# Patient Record
Sex: Male | Born: 1945
Health system: Southern US, Community
[De-identification: ages and names within clinical notes are randomized; demographics above are authoritative.]

## PROBLEM LIST (undated history)

## (undated) DIAGNOSIS — I219 Acute myocardial infarction, unspecified: Secondary | ICD-10-CM

## (undated) DIAGNOSIS — M1990 Unspecified osteoarthritis, unspecified site: Secondary | ICD-10-CM

## (undated) DIAGNOSIS — D689 Coagulation defect, unspecified: Secondary | ICD-10-CM

## (undated) DIAGNOSIS — G4730 Sleep apnea, unspecified: Secondary | ICD-10-CM

## (undated) DIAGNOSIS — E119 Type 2 diabetes mellitus without complications: Secondary | ICD-10-CM

## (undated) DIAGNOSIS — K219 Gastro-esophageal reflux disease without esophagitis: Secondary | ICD-10-CM

## (undated) DIAGNOSIS — G709 Myoneural disorder, unspecified: Secondary | ICD-10-CM

## (undated) DIAGNOSIS — C801 Malignant (primary) neoplasm, unspecified: Secondary | ICD-10-CM

## (undated) DIAGNOSIS — E785 Hyperlipidemia, unspecified: Secondary | ICD-10-CM

## (undated) DIAGNOSIS — R51 Headache: Secondary | ICD-10-CM

## (undated) DIAGNOSIS — I2510 Atherosclerotic heart disease of native coronary artery without angina pectoris: Secondary | ICD-10-CM

## (undated) DIAGNOSIS — I10 Essential (primary) hypertension: Secondary | ICD-10-CM

## (undated) HISTORY — DX: Coagulation defect, unspecified: D68.9

## (undated) HISTORY — PX: COLONOSCOPY: SHX174

## (undated) HISTORY — PX: CARPAL TUNNEL RELEASE: SHX101

## (undated) HISTORY — PX: TONSILLECTOMY: SUR1361

## (undated) HISTORY — DX: Sleep apnea, unspecified: G47.30

## (undated) HISTORY — DX: Hyperlipidemia, unspecified: E78.5

## (undated) HISTORY — PX: EXCISION MORTON'S NEUROMA: SHX5013

## (undated) HISTORY — PX: HERNIA REPAIR: SHX51

## (undated) HISTORY — PX: CARDIAC CATHETERIZATION: SHX172

## (undated) HISTORY — DX: Myoneural disorder, unspecified: G70.9

## (undated) HISTORY — PX: EYE SURGERY: SHX253

## (undated) HISTORY — DX: Malignant (primary) neoplasm, unspecified: C80.1

## (undated) HISTORY — DX: Essential (primary) hypertension: I10

## (undated) HISTORY — PX: POLYPECTOMY: SHX149

## (undated) HISTORY — PX: UMBILICAL HERNIA REPAIR: SHX196

---

## 2001-09-20 ENCOUNTER — Ambulatory Visit (HOSPITAL_COMMUNITY): Admission: RE | Admit: 2001-09-20 | Discharge: 2001-09-20 | Payer: Self-pay | Admitting: Gastroenterology

## 2001-09-20 ENCOUNTER — Encounter (INDEPENDENT_AMBULATORY_CARE_PROVIDER_SITE_OTHER): Payer: Self-pay | Admitting: Specialist

## 2002-05-25 ENCOUNTER — Ambulatory Visit (HOSPITAL_COMMUNITY): Admission: RE | Admit: 2002-05-25 | Discharge: 2002-05-25 | Payer: Self-pay | Admitting: Chiropractic Medicine

## 2002-05-25 ENCOUNTER — Encounter: Payer: Self-pay | Admitting: Chiropractic Medicine

## 2003-09-11 ENCOUNTER — Ambulatory Visit (HOSPITAL_COMMUNITY): Admission: RE | Admit: 2003-09-11 | Discharge: 2003-09-11 | Payer: Self-pay | Admitting: General Surgery

## 2003-09-11 ENCOUNTER — Ambulatory Visit (HOSPITAL_BASED_OUTPATIENT_CLINIC_OR_DEPARTMENT_OTHER): Admission: RE | Admit: 2003-09-11 | Discharge: 2003-09-11 | Payer: Self-pay | Admitting: General Surgery

## 2003-09-11 ENCOUNTER — Encounter (INDEPENDENT_AMBULATORY_CARE_PROVIDER_SITE_OTHER): Payer: Self-pay | Admitting: *Deleted

## 2006-10-11 ENCOUNTER — Ambulatory Visit (HOSPITAL_COMMUNITY): Admission: RE | Admit: 2006-10-11 | Discharge: 2006-10-11 | Payer: Self-pay | Admitting: Internal Medicine

## 2008-06-05 ENCOUNTER — Encounter: Admission: RE | Admit: 2008-06-05 | Discharge: 2008-06-05 | Payer: Self-pay | Admitting: Internal Medicine

## 2008-06-10 ENCOUNTER — Encounter: Admission: RE | Admit: 2008-06-10 | Discharge: 2008-06-10 | Payer: Self-pay | Admitting: Internal Medicine

## 2008-06-10 HISTORY — PX: CARDIOVASCULAR STRESS TEST: SHX262

## 2009-01-12 DIAGNOSIS — G5600 Carpal tunnel syndrome, unspecified upper limb: Secondary | ICD-10-CM | POA: Insufficient documentation

## 2009-01-12 DIAGNOSIS — G4730 Sleep apnea, unspecified: Secondary | ICD-10-CM | POA: Insufficient documentation

## 2009-01-12 DIAGNOSIS — F419 Anxiety disorder, unspecified: Secondary | ICD-10-CM | POA: Insufficient documentation

## 2009-01-12 DIAGNOSIS — N529 Male erectile dysfunction, unspecified: Secondary | ICD-10-CM | POA: Insufficient documentation

## 2009-01-12 DIAGNOSIS — M519 Unspecified thoracic, thoracolumbar and lumbosacral intervertebral disc disorder: Secondary | ICD-10-CM | POA: Insufficient documentation

## 2009-01-12 DIAGNOSIS — J45909 Unspecified asthma, uncomplicated: Secondary | ICD-10-CM | POA: Insufficient documentation

## 2009-01-26 DIAGNOSIS — M1990 Unspecified osteoarthritis, unspecified site: Secondary | ICD-10-CM | POA: Insufficient documentation

## 2009-10-15 ENCOUNTER — Ambulatory Visit: Payer: Self-pay | Admitting: Cardiovascular Disease

## 2010-04-27 ENCOUNTER — Other Ambulatory Visit: Payer: Self-pay

## 2010-04-28 ENCOUNTER — Ambulatory Visit
Admission: RE | Admit: 2010-04-28 | Discharge: 2010-04-28 | Disposition: A | Discharge status: Home / Self Care | Payer: 59 | Source: Ambulatory Visit | Attending: Internal Medicine | Admitting: Internal Medicine

## 2010-04-28 ENCOUNTER — Other Ambulatory Visit: Payer: Self-pay | Admitting: Internal Medicine

## 2010-04-28 DIAGNOSIS — R140 Abdominal distension (gaseous): Secondary | ICD-10-CM

## 2010-04-29 ENCOUNTER — Other Ambulatory Visit: Payer: Self-pay | Admitting: Gastroenterology

## 2010-04-29 DIAGNOSIS — R1013 Epigastric pain: Secondary | ICD-10-CM

## 2010-04-30 ENCOUNTER — Ambulatory Visit
Admission: RE | Admit: 2010-04-30 | Discharge: 2010-04-30 | Disposition: A | Discharge status: Home / Self Care | Payer: 59 | Source: Ambulatory Visit | Attending: Gastroenterology | Admitting: Gastroenterology

## 2010-04-30 DIAGNOSIS — R1013 Epigastric pain: Secondary | ICD-10-CM

## 2010-07-02 NOTE — Op Note (Signed)
NAME:  Hunter Mcbride, Hunter Mcbride                             ACCOUNT NO.:  [id number]   MEDICAL RECORD NO.:  [id number]                   PATIENT TYPE:  AMB   LOCATION:  DSC                                  FACILITY:  [facility]   PHYSICIAN:  Sammuel Hines. Daiva Nakayama, M.D.              DATE OF BIRTH:  1945-05-20   DATE OF PROCEDURE:  09/11/2003  DATE OF DISCHARGE:                                 OPERATIVE REPORT   PREOPERATIVE DIAGNOSIS:  A 1.0 cm mass in the left chest wall.   POSTOPERATIVE DIAGNOSIS:  A 1.0 cm mass in the left chest wall.   PROCEDURE:  Excision of 1.0 cm mass in the left chest wall.   SURGEON:  Sammuel Hines. Marlou Starks, M.D.   ANESTHESIA:  Local.   DESCRIPTION OF PROCEDURE:  After informed consent was obtained, the patient  was brought to the operating room and placed in the lateral position with  the left side up on the operating room table.  The area in question was  palpable.  It was prepped with Betadine and draped in the usual sterile  manner.  The skin overlying the area was infiltrated with 1% lidocaine with  1% lidocaine until a good fill block was obtained.   Next, a small transverse incision was made overlying the mass, and in an  elliptical fashion this incision was carried down through the skin and  subcutaneous tissue sharply with the #15 blade knife.  The palpable mass was  then able to be excised sharply from the rest of the subcutaneous tissue  with the Metzenbaum  scissors. The mass was then removed and sent to  pathology for further evaluation.  The incision was then closed with  interrupted 4-0 Monocryl subcuticular stitches.  Dressed with a Dermabond  dressing.   The patient tolerated the procedure well.  At the end of the case all  sponge, needle and instrument counts were correct.  The patient was then  awakened and taken to the recovery room in stable condition.                                               Sammuel Hines. Daiva Nakayama, M.D.    PST/MEDQ  D:  09/12/2003  T:   09/12/2003  Job:  [id number]

## 2010-10-07 ENCOUNTER — Telehealth: Payer: Self-pay | Admitting: Cardiovascular Disease

## 2010-10-07 ENCOUNTER — Encounter: Payer: Self-pay | Admitting: Cardiovascular Disease

## 2010-10-07 NOTE — Telephone Encounter (Signed)
Exertional cp 3 weeks ago, saw pcp and he said to see cardio. No current cp and is at work currently. Made app to see dr Lauro Regulus on first day he is in office. Pt to go to er with any further cp or call office with questions or concerns. Pt verbalized understanding. Corwin Levins RN

## 2010-10-07 NOTE — Telephone Encounter (Signed)
Patient called with recall letter to schedule an appointment.  When I went to sched 10.10. (first avail.) patient stated he has been having chest pain of an on about 2 wks ago.  He did see primary care and was told call cardiologist.  Patient going out of town mid-Sept.  Please call to triage this patient and get appt set.

## 2010-10-13 ENCOUNTER — Encounter: Payer: Self-pay | Admitting: Cardiovascular Disease

## 2010-10-13 ENCOUNTER — Ambulatory Visit (INDEPENDENT_AMBULATORY_CARE_PROVIDER_SITE_OTHER): Payer: 59 | Admitting: Cardiovascular Disease

## 2010-10-13 DIAGNOSIS — R079 Chest pain, unspecified: Secondary | ICD-10-CM

## 2010-10-13 DIAGNOSIS — R0789 Other chest pain: Secondary | ICD-10-CM | POA: Insufficient documentation

## 2010-10-13 NOTE — Progress Notes (Signed)
Hunter Mcbride Date of Birth  [birth year] [facility] [id number] [address]     [place],   [address] [phone]  Fax  [phone]  History of Present Illness:  Hunter Mcbride is a 65 year old gentleman with a history of hypertension and hyperlipidemia. He's been having some episodes of exertional chest discomfort.  There is no diaphoresis. There is no dyspnea. It is described as a chest pressure or heaviness. He does comment that he's been little bit more fatigue than usual.  Current Outpatient Prescriptions on File Prior to Visit  Medication Sig Dispense Refill  . aspirin 81 MG tablet Take 81 mg by mouth daily.        . Celecoxib (CELEBREX PO) Take by mouth as needed.        . chlorthalidone (HYGROTON) 25 MG tablet Take 25 mg by mouth daily.        [name] diltiazem (CARDIZEM CD) 180 MG 24 hr capsule Take 180 mg by mouth daily.        [name] gabapentin (NEURONTIN) 300 MG capsule Take 300 mg by mouth at bedtime.        . irbesartan (AVAPRO) 300 MG tablet Take 150 mg by mouth at bedtime.        . metFORMIN (GLUCOPHAGE) 500 MG tablet Take 500 mg by mouth 2 (two) times daily with a meal.        . nitroGLYCERIN (NITROSTAT) 0.4 MG SL tablet Place 0.4 mg under the tongue every 5 (five) minutes as needed.        . RABEprazole (ACIPHEX) 20 MG tablet Take 20 mg by mouth daily.        . rosuvastatin (CRESTOR) 10 MG tablet Take 5 mg by mouth daily.        . vitamin E (VITAMIN E) 400 UNIT capsule Take 400 Units by mouth daily.          No Known Allergies  Past Medical History  Diagnosis Date  . Chest pain   . Hyperlipidemia   . Hypertension   . Diabetes mellitus   . Sleep apnea   . Asthma   . Gout     Past Surgical History  Procedure Date  . Cardiovascular stress test 06/10/2008    EF 68%    History  Smoking status  . Never Smoker   Smokeless tobacco  . Not on file    History  Alcohol Use No    Family History  Problem Relation Age of Onset  .  Diabetes type II Mother   . Heart attack Father   . Coronary artery disease Cousin     Reviw of Systems:  Reviewed in the HPI.  All other systems are negative.  Physical Exam: BP 140/82  Pulse 69  Ht 5\' 9"  (1.753 m)  Wt 204 lb 6.4 oz (92.715 kg)  BMI 30.18 kg/m2 The patient is alert and oriented x 3.  The mood and affect are normal.   Skin: warm and dry.  Color is normal.    HEENT:   the sclera are nonicteric.  The mucous membranes are moist.  The carotids are 2+ without bruits.  There is no thyromegaly.  There is no JVD.    Lungs: clear.  The chest wall is non tender.    Heart: regular rate with a normal S1 and S2.  There are no murmurs, gallops, or rubs. The PMI is not displaced.     Abdomen: good bowel sounds.  There is no guarding or rebound.  There is no hepatosplenomegaly or tenderness.  There are no masses.   Extremities:  no clubbing, cyanosis, or edema.  The legs are without rashes.  The distal pulses are intact.   Neuro:  Cranial nerves II - XII are intact.  Motor and sensory functions are intact.    The gait is normal.  ECG:  Assessment / Plan:

## 2010-10-13 NOTE — Assessment & Plan Note (Signed)
He presents with some episodes of chest burning and chest pressure with exertion. These are concerning for unstable angina. We'll schedule him for a stress Myoview study.    His blood pressure is a little high today but it has been normal on previous checks.

## 2010-10-14 ENCOUNTER — Encounter: Payer: Self-pay | Admitting: Cardiovascular Disease

## 2010-10-19 ENCOUNTER — Encounter: Payer: Self-pay | Admitting: *Deleted

## 2010-10-20 ENCOUNTER — Telehealth: Payer: Self-pay | Admitting: Cardiovascular Disease

## 2010-10-20 NOTE — Telephone Encounter (Signed)
Faxed over EKG and Stress Test w/pics.  No Echo available.

## 2010-10-20 NOTE — Telephone Encounter (Signed)
Patsy needs last ECHO report, Last Stress Report with pics faxed

## 2010-10-21 ENCOUNTER — Ambulatory Visit (HOSPITAL_COMMUNITY): Payer: 59 | Attending: Cardiovascular Disease | Admitting: Radiology

## 2010-10-21 DIAGNOSIS — R0789 Other chest pain: Secondary | ICD-10-CM

## 2010-10-21 DIAGNOSIS — R079 Chest pain, unspecified: Secondary | ICD-10-CM

## 2010-10-21 DIAGNOSIS — E119 Type 2 diabetes mellitus without complications: Secondary | ICD-10-CM

## 2010-10-21 MED ORDER — TECHNETIUM TC 99M TETROFOSMIN IV KIT
11.0000 | PACK | Freq: Once | INTRAVENOUS | Status: AC | PRN
  Administered 2010-10-21: 11 via INTRAVENOUS

## 2010-10-21 MED ORDER — TECHNETIUM TC 99M TETROFOSMIN IV KIT
33.0000 | PACK | Freq: Once | INTRAVENOUS | Status: AC | PRN
  Administered 2010-10-21: 33 via INTRAVENOUS

## 2010-10-21 NOTE — Progress Notes (Signed)
[place] [address] [phone]  Cardiology Nuclear Med [place]  Hunter Mcbride is a 65 y.o. male [id number] Jan 29, 1946   Nuclear Med Background Indication for Stress Test:  Evaluation for Ischemia History: 06/10/08 Myocardial Perfusion Study: EF 68% Cardiac Risk Factors: Family History - CAD, Hypertension, Lipids and NIDDM  Symptoms:  Chest Pain, Chest Pressure, DOE, Fatigue and Palpitations   Nuclear Pre-Procedure Caffeine/Decaff Intake:  None NPO After: 7:00pm   Lungs:  clear IV 0.9% NS with Angio Cath:  20g  IV Site: R Forearm  IV Started by:  Eliezer Lofts, EMT-P  Chest Size (in):  46 Cup Size: n/a  Height: 5\' 9"  (1.753 m)  Weight:  204 lb (92.534 kg)  BMI:  Body mass index is 30.13 kg/(m^2). Tech Comments:  NA    Nuclear Med Study 1 or 2 day study: 1 day  Stress Test Type:  Stress  Reading MD: Jenkins Rouge, MD  Order Authorizing Provider:  P.Nahser  Resting Radionuclide: Technetium 75m Tetrofosmin  Resting Radionuclide Dose: 11.0 mCi   Stress Radionuclide:  Technetium 68m Tetrofosmin  Stress Radionuclide Dose: 33.0 mCi           Stress Protocol Rest HR: 54 Stress HR: 136  Rest BP: 139/75 Stress BP: 207/78  Exercise Time (min): 10:15 METS: 12.10   Predicted Max HR: 157 bpm % Max HR: 86.62 bpm Rate Pressure Product: 28152   Dose of Adenosine (mg):  n/a Dose of Lexiscan: n/a mg  Dose of Atropine (mg): n/a Dose of Dobutamine: n/a mcg/kg/min (at max HR)  Stress Test Technologist: Perrin Maltese, EMT-P  Nuclear Technologist:  Charlton Amor, CNMT     Rest Procedure:  Myocardial perfusion imaging was performed at rest 45 minutes following the intravenous administration of Technetium 77m Tetrofosmin. Rest ECG: NSR  Stress Procedure:  The patient exercised for 10:15.  The patient stopped due to fatigue and denied any chest pain.  There were non specific ST-T wave changes and rare pvcs/pacs.  Technetium  29m Tetrofosmin was injected at peak exercise and myocardial perfusion imaging was performed after a brief delay. Stress ECG: 1 mm upsloping ST segment depression in lateral leads at peak exercise  QPS Raw Data Images:  Normal; no motion artifact; normal heart/lung ratio. Stress Images:  Normal homogeneous uptake in all areas of the myocardium. Rest Images:  Normal homogeneous uptake in all areas of the myocardium. Subtraction (SDS):  Normal Transient Ischemic Dilatation (Normal <1.22):  1.18 Lung/Heart Ratio (Normal <0.45):  0.36  Quantitative Gated Spect Images QGS EDV:  109 ml QGS ESV:  48 ml QGS cine images:  NL LV Function; NL Wall Motion QGS EF: 56%  Impression Exercise Capacity:  Excellent exercise capacity. BP Response:  Hypertensive blood pressure response. Clinical Symptoms:  No chest pain. ECG Impression:  1 mm upsloping ST segment depression in lateral leads Comparison with Prior Nuclear Study: No images to compare  Overall Impression:  Low risk study.  Hypertensive response to exercise and borderline ECG response.  Normal EF and normal nuclear images    [employer]

## 2010-10-25 ENCOUNTER — Telehealth: Payer: Self-pay | Admitting: Cardiology

## 2010-10-25 NOTE — Telephone Encounter (Signed)
Left message to call back regarding stress test results.

## 2010-10-25 NOTE — Telephone Encounter (Signed)
Message copied by Alm Bustard on Mon Oct 25, 2010  5:03 PM ------      Message from: Dale, [place]      Created: Fri Oct 22, 2010  5:12 PM       Normal stress test.

## 2010-10-26 ENCOUNTER — Telehealth: Payer: Self-pay | Admitting: *Deleted

## 2010-10-26 NOTE — Telephone Encounter (Signed)
Patient called with normal echo results. Corwin Levins RN

## 2010-10-26 NOTE — Telephone Encounter (Signed)
Test results

## 2011-04-11 ENCOUNTER — Ambulatory Visit (INDEPENDENT_AMBULATORY_CARE_PROVIDER_SITE_OTHER): Payer: 59 | Admitting: Cardiovascular Disease

## 2011-04-11 ENCOUNTER — Encounter: Payer: Self-pay | Admitting: Cardiovascular Disease

## 2011-04-11 DIAGNOSIS — E119 Type 2 diabetes mellitus without complications: Secondary | ICD-10-CM | POA: Insufficient documentation

## 2011-04-11 DIAGNOSIS — E785 Hyperlipidemia, unspecified: Secondary | ICD-10-CM

## 2011-04-11 DIAGNOSIS — R079 Chest pain, unspecified: Secondary | ICD-10-CM

## 2011-04-11 DIAGNOSIS — I10 Essential (primary) hypertension: Secondary | ICD-10-CM | POA: Insufficient documentation

## 2011-04-11 LAB — HEPATIC FUNCTION PANEL
ALT: 26 U/L (ref 0–53)
Bilirubin, Direct: 0.2 mg/dL (ref 0.0–0.3)
Total Protein: 6.7 g/dL (ref 6.0–8.3)

## 2011-04-11 LAB — BASIC METABOLIC PANEL
BUN: 15 mg/dL (ref 6–23)
GFR: 101.83 mL/min (ref >60.00)
Potassium: 3.7 mEq/L (ref 3.5–5.1)
Sodium: 137 mEq/L (ref 135–145)

## 2011-04-11 LAB — LIPID PANEL
Cholesterol: 123 mg/dL (ref 0–200)
VLDL: 23.8 mg/dL (ref 0.0–40.0)

## 2011-04-11 NOTE — Assessment & Plan Note (Signed)
Stable

## 2011-04-11 NOTE — Assessment & Plan Note (Signed)
Hunter Mcbride is doing fairly well. We'll check fasting lipid profile later this week. I'll see him again in 6 months for an office visit antacid meds. He is to continue with a good diet and exercise program.

## 2011-04-11 NOTE — Patient Instructions (Signed)
Your physician wants you to follow-up in: 6 months  You will receive a reminder letter in the mail two months in advance. If you don't receive a letter, please call our office to schedule the follow-up appointment.   Your physician recommends that you return for a FASTING lipid profile: 6 months

## 2011-04-11 NOTE — Progress Notes (Signed)
Hunter Mcbride Date of Birth  02-Dec-1945 [name] [address]    [place], [place]  [address]    [place], [place]  [address] [phone]  Fax  [phone]  [phone]  Fax [phone]  Problem list: 1. History of chest pain-negative stress Myoview study in April, 2010 2. Hyperlipidemia 3. Hypertension 4. Diabetes mellitus 5. Sleep apnea  History of Present Illness:  Hunter Mcbride is doing well.  His quite active. 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 He's not had any episodes of chest pain, shortness breath, syncope, or presyncope.  Current Outpatient Prescriptions on File Prior to Visit  Medication Sig Dispense Refill  . Acetaminophen (TYLENOL PO) Take by mouth as needed.        [name] aspirin 81 MG tablet Take 81 mg by mouth daily.        . Celecoxib (CELEBREX PO) Take by mouth as needed.        . chlorthalidone (HYGROTON) 25 MG tablet Take 25 mg by mouth daily.        [name] diltiazem (CARDIZEM CD) 180 MG 24 hr capsule Take 180 mg by mouth daily.        [name] gabapentin (NEURONTIN) 300 MG capsule Take 300 mg by mouth at bedtime.        . irbesartan (AVAPRO) 300 MG tablet Take 150 mg by mouth at bedtime.        . metFORMIN (GLUCOPHAGE) 500 MG tablet Take 500 mg by mouth 2 (two) times daily with a meal.        . nitroGLYCERIN (NITROSTAT) 0.4 MG SL tablet Place 0.4 mg under the tongue every 5 (five) minutes as needed.        . rosuvastatin (CRESTOR) 10 MG tablet Take 5 mg by mouth daily.          No Known Allergies  Past Medical History  Diagnosis Date  . Chest pain   . Hyperlipidemia   . Hypertension   . Diabetes mellitus   . Sleep apnea   . Asthma   . Gout     Past Surgical History  Procedure Date  . Cardiovascular stress test 06/10/2008    EF 68%    History  Smoking status  . Never Smoker   Smokeless tobacco  . Not on file    History  Alcohol Use No    Family History  Problem  Relation Age of Onset  . Diabetes type II Mother   . Heart attack Father   . Coronary artery disease Cousin     Reviw of Systems:  Reviewed in the HPI.  All other systems are negative.  Physical Exam: Blood pressure 145/85, pulse 65, height 5\' 9"  (1.753 m), weight 208 lb 1.9 oz (94.403 kg). General: Well developed, well nourished, in no acute distress.  Head: Normocephalic, atraumatic, sclera non-icteric, mucus membranes are moist,   Neck: Supple. Carotids are 2 + without bruits. No JVD  Lungs: Clear bilaterally to auscultation.  Heart: regular rate  With normal  S1 S2. No murmurs, gallops or rubs.  Abdomen: Soft, non-tender, non-distended with normal bowel sounds. No hepatomegaly. No rebound/guarding. No masses.  Msk:  Strength and tone are normal  Extremities: No clubbing or cyanosis. No edema.  Distal pedal pulses are 2+ and equal bilaterally.  Neuro: Alert and oriented X 3. Moves all extremities spontaneously.  Psych:  Responds to questions appropriately with a normal affect.  ECG:  Assessment / Plan:

## 2011-04-15 DIAGNOSIS — M25519 Pain in unspecified shoulder: Secondary | ICD-10-CM | POA: Insufficient documentation

## 2011-04-15 DIAGNOSIS — R6882 Decreased libido: Secondary | ICD-10-CM | POA: Insufficient documentation

## 2011-10-19 DIAGNOSIS — M7021 Olecranon bursitis, right elbow: Secondary | ICD-10-CM | POA: Insufficient documentation

## 2011-10-27 ENCOUNTER — Encounter: Payer: Self-pay | Admitting: Cardiovascular Disease

## 2011-11-25 ENCOUNTER — Other Ambulatory Visit: Payer: Self-pay | Admitting: Orthopedic Surgery

## 2011-11-25 DIAGNOSIS — M25511 Pain in right shoulder: Secondary | ICD-10-CM

## 2011-11-30 ENCOUNTER — Ambulatory Visit
Admission: RE | Admit: 2011-11-30 | Discharge: 2011-11-30 | Disposition: A | Discharge status: Home / Self Care | Payer: Medicare Other | Source: Ambulatory Visit | Attending: Orthopedic Surgery | Admitting: Orthopedic Surgery

## 2011-11-30 DIAGNOSIS — M25511 Pain in right shoulder: Secondary | ICD-10-CM

## 2011-12-01 DIAGNOSIS — H9190 Unspecified hearing loss, unspecified ear: Secondary | ICD-10-CM | POA: Insufficient documentation

## 2011-12-02 ENCOUNTER — Other Ambulatory Visit: Payer: Self-pay | Admitting: Orthopedic Surgery

## 2011-12-02 ENCOUNTER — Encounter (HOSPITAL_COMMUNITY): Payer: Self-pay

## 2011-12-02 ENCOUNTER — Ambulatory Visit (HOSPITAL_COMMUNITY)
Admission: RE | Admit: 2011-12-02 | Discharge: 2011-12-02 | Disposition: A | Discharge status: Home / Self Care | Payer: Medicare Other | Source: Ambulatory Visit | Attending: Orthopedic Surgery | Admitting: Orthopedic Surgery

## 2011-12-02 DIAGNOSIS — IMO0002 Reserved for concepts with insufficient information to code with codable children: Secondary | ICD-10-CM | POA: Insufficient documentation

## 2011-12-02 MED ORDER — SODIUM CHLORIDE 0.9 % IV SOLN: INTRAVENOUS | Status: DC

## 2011-12-02 MED ORDER — CEFAZOLIN SODIUM-DEXTROSE 2-3 GM-% IV SOLR
2.0000 g | INTRAVENOUS | Status: AC
  Administered 2011-12-02: 2 g via INTRAVENOUS
  Filled 2011-12-02: qty 50

## 2012-02-27 ENCOUNTER — Encounter (HOSPITAL_COMMUNITY)
Admission: RE | Admit: 2012-02-27 | Discharge: 2012-02-27 | Disposition: A | Discharge status: Home / Self Care | Payer: Medicare Other | Source: Ambulatory Visit | Attending: Orthopedic Surgery | Admitting: Orthopedic Surgery

## 2012-02-27 ENCOUNTER — Encounter (HOSPITAL_COMMUNITY): Payer: Self-pay

## 2012-02-27 DIAGNOSIS — Z01818 Encounter for other preprocedural examination: Secondary | ICD-10-CM | POA: Diagnosis not present

## 2012-02-27 HISTORY — DX: Unspecified osteoarthritis, unspecified site: M19.90

## 2012-02-27 HISTORY — DX: Gastro-esophageal reflux disease without esophagitis: K21.9

## 2012-02-27 LAB — TYPE AND SCREEN: ABO/RH(D): O POS

## 2012-02-27 LAB — CBC
Hemoglobin: 15.4 g/dL (ref 13.0–17.0)
MCV: 86.3 fL (ref 78.0–100.0)
Platelets: 152 10*3/uL (ref 150–400)
RBC: 5.19 MIL/uL (ref 4.22–5.81)
WBC: 5 10*3/uL (ref 4.0–10.5)

## 2012-02-27 LAB — BASIC METABOLIC PANEL
CO2: 27 mEq/L (ref 19–32)
Chloride: 100 mEq/L (ref 96–112)
Glucose, Bld: 259 mg/dL — ABNORMAL HIGH (ref 70–99)
Sodium: 141 mEq/L (ref 135–145)

## 2012-02-27 LAB — SURGICAL PCR SCREEN
MRSA, PCR: NEGATIVE
Staphylococcus aureus: POSITIVE — AB

## 2012-02-27 NOTE — Progress Notes (Signed)
Abnormal labs noted: Gluc=259.  Chart given to A. Zelnak, PA, to review.

## 2012-02-28 DIAGNOSIS — H01009 Unspecified blepharitis unspecified eye, unspecified eyelid: Secondary | ICD-10-CM | POA: Diagnosis not present

## 2012-02-28 DIAGNOSIS — H2510 Age-related nuclear cataract, unspecified eye: Secondary | ICD-10-CM | POA: Diagnosis not present

## 2012-02-28 DIAGNOSIS — H40019 Open angle with borderline findings, low risk, unspecified eye: Secondary | ICD-10-CM | POA: Diagnosis not present

## 2012-02-28 NOTE — Consult Note (Signed)
Anesthesiology Chart Review:  67 Y/O male with obesity, Htn, Type 2 DM for R. Total Shoulder arthroplasty 03/01/12 by Dr. Onnie Graham.  (-) nuclear stress test with EF 56% on 10/21/10 saw Dr. Acie Fredrickson 04/11/11 and was clinically stable   Glucose 259 labs O/W OK.  Pt's anesthesiologist assigned to his case will evaluate him on the DOS.  Roberts Gaudy, MD

## 2012-02-29 MED ORDER — CHLORHEXIDINE GLUCONATE 4 % EX LIQD: 60.0000 mL | Freq: Once | CUTANEOUS | Status: DC

## 2012-02-29 MED ORDER — CEFAZOLIN SODIUM-DEXTROSE 2-3 GM-% IV SOLR
2.0000 g | INTRAVENOUS | Status: AC
  Administered 2012-03-01: 2 g via INTRAVENOUS
  Filled 2012-02-29: qty 50

## 2012-03-01 ENCOUNTER — Inpatient Hospital Stay (HOSPITAL_COMMUNITY): Payer: Medicare Other | Admitting: Anesthesiology

## 2012-03-01 ENCOUNTER — Encounter (HOSPITAL_COMMUNITY): Payer: Self-pay | Admitting: Anesthesiology

## 2012-03-01 ENCOUNTER — Inpatient Hospital Stay (HOSPITAL_COMMUNITY)
Admission: RE | Admit: 2012-03-01 | Discharge: 2012-03-02 | DRG: 484 | Disposition: A | Discharge status: Home / Self Care | Payer: Medicare Other | Source: Ambulatory Visit | Attending: Orthopedic Surgery | Admitting: Orthopedic Surgery

## 2012-03-01 ENCOUNTER — Encounter (HOSPITAL_COMMUNITY)
Admission: RE | Disposition: A | Discharge status: Home / Self Care | Payer: Self-pay | Source: Ambulatory Visit | Attending: Orthopedic Surgery

## 2012-03-01 DIAGNOSIS — E119 Type 2 diabetes mellitus without complications: Secondary | ICD-10-CM | POA: Diagnosis present

## 2012-03-01 DIAGNOSIS — Z7982 Long term (current) use of aspirin: Secondary | ICD-10-CM

## 2012-03-01 DIAGNOSIS — I10 Essential (primary) hypertension: Secondary | ICD-10-CM | POA: Diagnosis present

## 2012-03-01 DIAGNOSIS — E785 Hyperlipidemia, unspecified: Secondary | ICD-10-CM | POA: Diagnosis present

## 2012-03-01 DIAGNOSIS — Z01818 Encounter for other preprocedural examination: Secondary | ICD-10-CM | POA: Diagnosis not present

## 2012-03-01 DIAGNOSIS — J45909 Unspecified asthma, uncomplicated: Secondary | ICD-10-CM | POA: Diagnosis present

## 2012-03-01 DIAGNOSIS — Z79899 Other long term (current) drug therapy: Secondary | ICD-10-CM

## 2012-03-01 DIAGNOSIS — M109 Gout, unspecified: Secondary | ICD-10-CM | POA: Diagnosis present

## 2012-03-01 DIAGNOSIS — Z01812 Encounter for preprocedural laboratory examination: Secondary | ICD-10-CM

## 2012-03-01 DIAGNOSIS — G8918 Other acute postprocedural pain: Secondary | ICD-10-CM | POA: Diagnosis not present

## 2012-03-01 DIAGNOSIS — G4730 Sleep apnea, unspecified: Secondary | ICD-10-CM | POA: Diagnosis present

## 2012-03-01 DIAGNOSIS — M1990 Unspecified osteoarthritis, unspecified site: Secondary | ICD-10-CM | POA: Diagnosis present

## 2012-03-01 DIAGNOSIS — K219 Gastro-esophageal reflux disease without esophagitis: Secondary | ICD-10-CM | POA: Diagnosis present

## 2012-03-01 DIAGNOSIS — M19019 Primary osteoarthritis, unspecified shoulder: Secondary | ICD-10-CM | POA: Diagnosis not present

## 2012-03-01 DIAGNOSIS — M25519 Pain in unspecified shoulder: Secondary | ICD-10-CM | POA: Diagnosis not present

## 2012-03-01 HISTORY — PX: TOTAL SHOULDER ARTHROPLASTY: SHX126

## 2012-03-01 LAB — GLUCOSE, CAPILLARY: Glucose-Capillary: 147 mg/dL — ABNORMAL HIGH (ref 70–99)

## 2012-03-01 SURGERY — ARTHROPLASTY, SHOULDER, TOTAL
Anesthesia: General | Site: Shoulder | Laterality: Right | Wound class: Clean

## 2012-03-01 MED ORDER — ASPIRIN 81 MG PO TABS: 81.0000 mg | ORAL_TABLET | Freq: Every day | ORAL | Status: DC

## 2012-03-01 MED ORDER — ONDANSETRON HCL 4 MG PO TABS: 4.0000 mg | ORAL_TABLET | Freq: Four times a day (QID) | ORAL | Status: DC | PRN

## 2012-03-01 MED ORDER — PHENOL 1.4 % MT LIQD: 1.0000 | OROMUCOSAL | Status: DC | PRN

## 2012-03-01 MED ORDER — MIDAZOLAM HCL 5 MG/5ML IJ SOLN
INTRAMUSCULAR | Status: DC | PRN
  Administered 2012-03-01: 2 mg via INTRAVENOUS

## 2012-03-01 MED ORDER — IRBESARTAN 150 MG PO TABS
150.0000 mg | ORAL_TABLET | Freq: Every day | ORAL | Status: DC
  Administered 2012-03-01 – 2012-03-02 (×2): 150 mg via ORAL
  Filled 2012-03-01 (×2): qty 1

## 2012-03-01 MED ORDER — PROPOFOL 10 MG/ML IV BOLUS
INTRAVENOUS | Status: DC | PRN
  Administered 2012-03-01: 200 mg via INTRAVENOUS

## 2012-03-01 MED ORDER — CHLORTHALIDONE 25 MG PO TABS
25.0000 mg | ORAL_TABLET | Freq: Every day | ORAL | Status: DC
  Administered 2012-03-01 – 2012-03-02 (×2): 25 mg via ORAL
  Filled 2012-03-01 (×2): qty 1

## 2012-03-01 MED ORDER — ONDANSETRON HCL 4 MG/2ML IJ SOLN
4.0000 mg | Freq: Four times a day (QID) | INTRAMUSCULAR | Status: DC | PRN
  Administered 2012-03-01: 4 mg via INTRAVENOUS
  Filled 2012-03-01: qty 2

## 2012-03-01 MED ORDER — METOCLOPRAMIDE HCL 5 MG/ML IJ SOLN: 5.0000 mg | Freq: Three times a day (TID) | INTRAMUSCULAR | Status: DC | PRN

## 2012-03-01 MED ORDER — ALUM & MAG HYDROXIDE-SIMETH 200-200-20 MG/5ML PO SUSP: 30.0000 mL | ORAL | Status: DC | PRN

## 2012-03-01 MED ORDER — DOCUSATE SODIUM 100 MG PO CAPS
100.0000 mg | ORAL_CAPSULE | Freq: Two times a day (BID) | ORAL | Status: DC
  Administered 2012-03-01 – 2012-03-02 (×2): 100 mg via ORAL
  Filled 2012-03-01 (×2): qty 1

## 2012-03-01 MED ORDER — HYDROMORPHONE HCL PF 1 MG/ML IJ SOLN
0.2500 mg | INTRAMUSCULAR | Status: DC | PRN
  Administered 2012-03-01 (×2): 0.5 mg via INTRAVENOUS

## 2012-03-01 MED ORDER — ACETAMINOPHEN 10 MG/ML IV SOLN
INTRAVENOUS | Status: DC | PRN
  Administered 2012-03-01: 1000 mg via INTRAVENOUS

## 2012-03-01 MED ORDER — METOCLOPRAMIDE HCL 10 MG PO TABS: 5.0000 mg | ORAL_TABLET | Freq: Three times a day (TID) | ORAL | Status: DC | PRN

## 2012-03-01 MED ORDER — NEOSTIGMINE METHYLSULFATE 1 MG/ML IJ SOLN
INTRAMUSCULAR | Status: DC | PRN
  Administered 2012-03-01: 5 mg via INTRAVENOUS

## 2012-03-01 MED ORDER — ONDANSETRON HCL 4 MG/2ML IJ SOLN
INTRAMUSCULAR | Status: DC | PRN
  Administered 2012-03-01: 4 mg via INTRAVENOUS

## 2012-03-01 MED ORDER — PANTOPRAZOLE SODIUM 40 MG PO TBEC: 40.0000 mg | DELAYED_RELEASE_TABLET | Freq: Every day | ORAL | Status: DC

## 2012-03-01 MED ORDER — METHOCARBAMOL 500 MG PO TABS
500.0000 mg | ORAL_TABLET | Freq: Three times a day (TID) | ORAL | Status: DC | PRN
  Administered 2012-03-01: 500 mg via ORAL
  Filled 2012-03-01: qty 1

## 2012-03-01 MED ORDER — HYDROMORPHONE HCL PF 1 MG/ML IJ SOLN
0.2500 mg | INTRAMUSCULAR | Status: DC | PRN
  Administered 2012-03-01: 1 mg via INTRAVENOUS
  Administered 2012-03-02: 0.5 mg via INTRAVENOUS
  Filled 2012-03-01 (×2): qty 1

## 2012-03-01 MED ORDER — LACTATED RINGERS IV SOLN
INTRAVENOUS | Status: DC | PRN
  Administered 2012-03-01 (×2): via INTRAVENOUS

## 2012-03-01 MED ORDER — OXYCODONE HCL 5 MG PO TABS: 5.0000 mg | ORAL_TABLET | Freq: Once | ORAL | Status: DC | PRN

## 2012-03-01 MED ORDER — EPHEDRINE SULFATE 50 MG/ML IJ SOLN
INTRAMUSCULAR | Status: DC | PRN
  Administered 2012-03-01 (×5): 10 mg via INTRAVENOUS

## 2012-03-01 MED ORDER — BUPIVACAINE HCL (PF) 0.5 % IJ SOLN
INTRAMUSCULAR | Status: DC | PRN
  Administered 2012-03-01: 30 mL

## 2012-03-01 MED ORDER — ASPIRIN EC 81 MG PO TBEC
81.0000 mg | DELAYED_RELEASE_TABLET | Freq: Every day | ORAL | Status: DC
  Administered 2012-03-02: 81 mg via ORAL
  Filled 2012-03-01: qty 1

## 2012-03-01 MED ORDER — ATORVASTATIN CALCIUM 20 MG PO TABS
20.0000 mg | ORAL_TABLET | Freq: Every day | ORAL | Status: DC
  Administered 2012-03-01: 20 mg via ORAL
  Filled 2012-03-01 (×2): qty 1

## 2012-03-01 MED ORDER — METHOCARBAMOL 100 MG/ML IJ SOLN
500.0000 mg | Freq: Three times a day (TID) | INTRAVENOUS | Status: DC | PRN
  Filled 2012-03-01: qty 5

## 2012-03-01 MED ORDER — LACTATED RINGERS IV SOLN: INTRAVENOUS | Status: DC

## 2012-03-01 MED ORDER — ACETAMINOPHEN 650 MG RE SUPP: 650.0000 mg | Freq: Four times a day (QID) | RECTAL | Status: DC | PRN

## 2012-03-01 MED ORDER — SODIUM CHLORIDE 0.9 % IR SOLN
Status: DC | PRN
  Administered 2012-03-01: 1000 mL

## 2012-03-01 MED ORDER — GLYCOPYRROLATE 0.2 MG/ML IJ SOLN
INTRAMUSCULAR | Status: DC | PRN
  Administered 2012-03-01: .8 mg via INTRAVENOUS

## 2012-03-01 MED ORDER — OXYCODONE HCL 5 MG/5ML PO SOLN: 5.0000 mg | Freq: Once | ORAL | Status: DC | PRN

## 2012-03-01 MED ORDER — ACETAMINOPHEN 10 MG/ML IV SOLN
INTRAVENOUS | Status: AC
  Filled 2012-03-01: qty 100

## 2012-03-01 MED ORDER — NITROGLYCERIN 0.4 MG SL SUBL: 0.4000 mg | SUBLINGUAL_TABLET | SUBLINGUAL | Status: DC | PRN

## 2012-03-01 MED ORDER — GABAPENTIN 300 MG PO CAPS
300.0000 mg | ORAL_CAPSULE | Freq: Every day | ORAL | Status: DC
  Administered 2012-03-01: 300 mg via ORAL
  Filled 2012-03-01 (×2): qty 1

## 2012-03-01 MED ORDER — MENTHOL 3 MG MT LOZG: 1.0000 | LOZENGE | OROMUCOSAL | Status: DC | PRN

## 2012-03-01 MED ORDER — ROCURONIUM BROMIDE 100 MG/10ML IV SOLN
INTRAVENOUS | Status: DC | PRN
  Administered 2012-03-01: 10 mg via INTRAVENOUS
  Administered 2012-03-01: 50 mg via INTRAVENOUS
  Administered 2012-03-01: 10 mg via INTRAVENOUS

## 2012-03-01 MED ORDER — OXYCODONE HCL 5 MG PO TABS: 5.0000 mg | ORAL_TABLET | ORAL | Status: DC | PRN

## 2012-03-01 MED ORDER — PROMETHAZINE HCL 25 MG/ML IJ SOLN
6.2500 mg | INTRAMUSCULAR | Status: DC | PRN
  Administered 2012-03-01: 6.25 mg via INTRAVENOUS

## 2012-03-01 MED ORDER — DIPHENHYDRAMINE HCL 12.5 MG/5ML PO ELIX: 12.5000 mg | ORAL_SOLUTION | ORAL | Status: DC | PRN

## 2012-03-01 MED ORDER — DILTIAZEM HCL ER COATED BEADS 180 MG PO CP24
180.0000 mg | ORAL_CAPSULE | Freq: Every day | ORAL | Status: DC
  Administered 2012-03-01: 180 mg via ORAL
  Filled 2012-03-01 (×2): qty 1

## 2012-03-01 MED ORDER — ARTIFICIAL TEARS OP OINT
TOPICAL_OINTMENT | OPHTHALMIC | Status: DC | PRN
  Administered 2012-03-01: 1 via OPHTHALMIC

## 2012-03-01 MED ORDER — FENTANYL CITRATE 0.05 MG/ML IJ SOLN
INTRAMUSCULAR | Status: DC | PRN
  Administered 2012-03-01 (×3): 50 ug via INTRAVENOUS
  Administered 2012-03-01: 100 ug via INTRAVENOUS
  Administered 2012-03-01: 50 ug via INTRAVENOUS
  Administered 2012-03-01 (×2): 100 ug via INTRAVENOUS

## 2012-03-01 MED ORDER — LIDOCAINE HCL (CARDIAC) 20 MG/ML IV SOLN
INTRAVENOUS | Status: DC | PRN
  Administered 2012-03-01: 100 mg via INTRAVENOUS

## 2012-03-01 MED ORDER — KETOROLAC TROMETHAMINE 15 MG/ML IJ SOLN
15.0000 mg | Freq: Four times a day (QID) | INTRAMUSCULAR | Status: DC
  Administered 2012-03-01 – 2012-03-02 (×4): 15 mg via INTRAVENOUS
  Filled 2012-03-01 (×7): qty 1

## 2012-03-01 MED ORDER — PHENYLEPHRINE HCL 10 MG/ML IJ SOLN
INTRAMUSCULAR | Status: DC | PRN
  Administered 2012-03-01: 80 ug via INTRAVENOUS
  Administered 2012-03-01: 40 ug via INTRAVENOUS
  Administered 2012-03-01 (×2): 80 ug via INTRAVENOUS

## 2012-03-01 MED ORDER — HYDROMORPHONE HCL PF 1 MG/ML IJ SOLN
INTRAMUSCULAR | Status: AC
  Filled 2012-03-01: qty 1

## 2012-03-01 MED ORDER — TEMAZEPAM 15 MG PO CAPS: 15.0000 mg | ORAL_CAPSULE | Freq: Every evening | ORAL | Status: DC | PRN

## 2012-03-01 MED ORDER — ACETAMINOPHEN 325 MG PO TABS: 650.0000 mg | ORAL_TABLET | Freq: Four times a day (QID) | ORAL | Status: DC | PRN

## 2012-03-01 MED ORDER — PROMETHAZINE HCL 25 MG/ML IJ SOLN
INTRAMUSCULAR | Status: AC
  Filled 2012-03-01: qty 1

## 2012-03-01 MED ORDER — MEPERIDINE HCL 25 MG/ML IJ SOLN: 6.2500 mg | INTRAMUSCULAR | Status: DC | PRN

## 2012-03-01 MED ORDER — ACETAMINOPHEN 10 MG/ML IV SOLN
1000.0000 mg | Freq: Four times a day (QID) | INTRAVENOUS | Status: DC
  Administered 2012-03-01 – 2012-03-02 (×3): 1000 mg via INTRAVENOUS
  Filled 2012-03-01 (×5): qty 100

## 2012-03-01 MED ORDER — CEFAZOLIN SODIUM 1-5 GM-% IV SOLN
1.0000 g | Freq: Four times a day (QID) | INTRAVENOUS | Status: AC
  Administered 2012-03-01 – 2012-03-02 (×3): 1 g via INTRAVENOUS
  Filled 2012-03-01 (×3): qty 50

## 2012-03-01 MED ORDER — LACTATED RINGERS IV SOLN
INTRAVENOUS | Status: DC
  Administered 2012-03-01 (×2): via INTRAVENOUS

## 2012-03-01 SURGICAL SUPPLY — 62 items
BLADE SAW SGTL 83.5X18.5 (BLADE) ×2 IMPLANT
CEMENT BONE DEPUY (Cement) ×1 IMPLANT
CLOTH BEACON ORANGE TIMEOUT ST (SAFETY) ×2 IMPLANT
COVER SURGICAL LIGHT HANDLE (MISCELLANEOUS) ×2 IMPLANT
DRAPE INCISE IOBAN 66X45 STRL (DRAPES) ×2 IMPLANT
DRAPE SURG 17X11 SM STRL (DRAPES) ×2 IMPLANT
DRAPE SURG 17X23 STRL (DRAPES) ×2 IMPLANT
DRAPE U-SHAPE 47X51 STRL (DRAPES) ×2 IMPLANT
DRILL BIT 7/64X5 (BIT) IMPLANT
DRSG AQUACEL AG ADV 3.5X10 (GAUZE/BANDAGES/DRESSINGS) ×2 IMPLANT
DRSG MEPILEX BORDER 4X8 (GAUZE/BANDAGES/DRESSINGS) ×2 IMPLANT
DURAPREP 26ML APPLICATOR (WOUND CARE) ×4 IMPLANT
ELECT BLADE 4.0 EZ CLEAN MEGAD (MISCELLANEOUS) ×2
ELECT CAUTERY BLADE 6.4 (BLADE) ×2 IMPLANT
ELECT REM PT RETURN 9FT ADLT (ELECTROSURGICAL) ×2
ELECTRODE BLDE 4.0 EZ CLN MEGD (MISCELLANEOUS) ×1 IMPLANT
ELECTRODE REM PT RTRN 9FT ADLT (ELECTROSURGICAL) ×1 IMPLANT
FACESHIELD LNG OPTICON STERILE (SAFETY) ×6 IMPLANT
GLENOID ANCHOR PEG CROSSLK 48 (Orthopedic Implant) ×1 IMPLANT
GLOVE BIO SURGEON STRL SZ7.5 (GLOVE) ×2 IMPLANT
GLOVE BIO SURGEON STRL SZ8 (GLOVE) ×2 IMPLANT
GLOVE EUDERMIC 7 POWDERFREE (GLOVE) ×2 IMPLANT
GLOVE SS BIOGEL STRL SZ 7.5 (GLOVE) ×1 IMPLANT
GLOVE SUPERSENSE BIOGEL SZ 7.5 (GLOVE) ×1
GOWN STRL NON-REIN LRG LVL3 (GOWN DISPOSABLE) ×2 IMPLANT
GOWN STRL REIN XL XLG (GOWN DISPOSABLE) ×4 IMPLANT
HEAD HUMERAL 52MMX18MM (Trauma) IMPLANT
HUMERAL HEAD 52MMX18MM (Trauma) ×2 IMPLANT
HUMERAL STEM 14MM (Trauma) ×1 IMPLANT
KIT BASIN OR (CUSTOM PROCEDURE TRAY) ×2 IMPLANT
KIT ROOM TURNOVER OR (KITS) ×2 IMPLANT
MANIFOLD NEPTUNE II (INSTRUMENTS) ×2 IMPLANT
NDL HYPO 25GX1X1/2 BEV (NEEDLE) IMPLANT
NDL SUT 6 .5 CRC .975X.05 MAYO (NEEDLE) ×1 IMPLANT
NEEDLE HYPO 25GX1X1/2 BEV (NEEDLE) IMPLANT
NEEDLE MAYO TAPER (NEEDLE) ×2
NS IRRIG 1000ML POUR BTL (IV SOLUTION) ×2 IMPLANT
PACK SHOULDER (CUSTOM PROCEDURE TRAY) ×2 IMPLANT
PAD ARMBOARD 7.5X6 YLW CONV (MISCELLANEOUS) ×4 IMPLANT
PASSER SUT SWANSON 36MM LOOP (INSTRUMENTS) IMPLANT
PIN METAGLENE 2.5 (PIN) ×2 IMPLANT
SLING ARM FOAM STRAP LRG (SOFTGOODS) IMPLANT
SLING ARM FOAM STRAP XLG (SOFTGOODS) ×2 IMPLANT
SMARTMIX MINI TOWER (MISCELLANEOUS) ×2
SPONGE LAP 18X18 X RAY DECT (DISPOSABLE) ×2 IMPLANT
SPONGE LAP 4X18 X RAY DECT (DISPOSABLE) ×2 IMPLANT
STRIP CLOSURE SKIN 1/2X4 (GAUZE/BANDAGES/DRESSINGS) ×2 IMPLANT
SUCTION FRAZIER TIP 10 FR DISP (SUCTIONS) ×2 IMPLANT
SUT BONE WAX W31G (SUTURE) IMPLANT
SUT FIBERWIRE #2 38 T-5 BLUE (SUTURE) ×4
SUT MNCRL AB 3-0 PS2 18 (SUTURE) ×2 IMPLANT
SUT VIC AB 1 CT1 27 (SUTURE) ×6
SUT VIC AB 1 CT1 27XBRD ANBCTR (SUTURE) ×3 IMPLANT
SUT VIC AB 2-0 CT1 27 (SUTURE) ×4
SUT VIC AB 2-0 CT1 TAPERPNT 27 (SUTURE) ×2 IMPLANT
SUTURE FIBERWR #2 38 T-5 BLUE (SUTURE) ×2 IMPLANT
SYR 30ML SLIP (SYRINGE) ×2 IMPLANT
SYR CONTROL 10ML LL (SYRINGE) IMPLANT
TOWEL OR 17X24 6PK STRL BLUE (TOWEL DISPOSABLE) ×2 IMPLANT
TOWEL OR 17X26 10 PK STRL BLUE (TOWEL DISPOSABLE) ×2 IMPLANT
TOWER SMARTMIX MINI (MISCELLANEOUS) ×1 IMPLANT
WATER STERILE IRR 1000ML POUR (IV SOLUTION) ×2 IMPLANT

## 2012-03-01 NOTE — H&P (Signed)
Hunter Mcbride    Chief Complaint: OSTEOARTHRITIS RIGHT SHOULDER HPI: The patient is a 67 y.o. male with end stage right shoulder OA  Past Medical History  Diagnosis Date  . Chest pain   . Hyperlipidemia   . Hypertension   . Diabetes mellitus   . Asthma   . Gout   . Sleep apnea     DONE 7-8 YRS AGO  ST (DOES NOT WEAR CPAP  . GERD (gastroesophageal reflux disease)   . Arthritis     OA    Past Surgical History  Procedure Date  . Cardiovascular stress test 06/10/2008    EF 68%  . Carpal tunnel release     BIL   . Foot surgery     LT  [facility]ONS NEUROMA   . Tonsillectomy   . Eye surgery     LEFT EYE 25 YRS AGO     Family History  Problem Relation Age of Onset  . Diabetes type II Mother   . Heart attack Father   . Coronary artery disease Cousin     Social History:  reports that he has never smoked. He does not have any smokeless tobacco history on file. He reports that he does not drink alcohol or use illicit drugs.  Allergies: No Known Allergies  Medications Prior to Admission  Medication Sig Dispense Refill  . acetaminophen (TYLENOL ARTHRITIS PAIN) 650 MG CR tablet Take 1,300 mg by mouth 2 (two) times daily as needed. For pain      . aspirin 81 MG tablet Take 81 mg by mouth daily.        . celecoxib (CELEBREX) 200 MG capsule Take 200 mg by mouth 2 (two) times daily as needed. For pain      . chlorthalidone (HYGROTON) 25 MG tablet Take 25 mg by mouth daily.        [name] diltiazem (CARDIZEM CD) 180 MG 24 hr capsule Take 180 mg by mouth daily.        [name] gabapentin (NEURONTIN) 300 MG capsule Take 300 mg by mouth at bedtime.        . irbesartan (AVAPRO) 300 MG tablet Take 150 mg by mouth daily.       [name] lidocaine (LIDODERM) 5 % Place 1 patch onto the skin daily. Remove & Discard patch within 12 hours or as directed by MD      . metFORMIN (GLUCOPHAGE) 500 MG tablet Take 500 mg by mouth 2 (two) times daily with a meal.        . nitroGLYCERIN (NITROSTAT) 0.4 MG SL tablet Place  0.4 mg under the tongue every 5 (five) minutes as needed. For chest pain      . oxyCODONE (OXYCONTIN) 10 MG 12 hr tablet Take 10 mg by mouth every 6 (six) hours as needed. For pain      . pantoprazole (PROTONIX) 40 MG tablet Take 40 mg by mouth daily.      . rosuvastatin (CRESTOR) 10 MG tablet Take 5 mg by mouth every other day.          Physical Exam: right shoulder  with painful and restricted ROM as noted at 12/23 office visit  Vitals  Temp:  [97.9 F (36.6 C)] 97.9 F (36.6 C) (01/16 0601) Pulse Rate:  [61] 61  (01/16 0601) Resp:  [18] 18  (01/16 0601) BP: (132)/(83) 132/83 mmHg (01/16 0601) SpO2:  [96 %] 96 % (01/16 0601)  Assessment/Plan  Impression: OSTEOARTHRITIS RIGHT SHOULDER  Plan of Action:  Procedure(s): TOTAL SHOULDER ARTHROPLASTY  Hunter Mcbride 03/01/2012, 7:27 AM

## 2012-03-01 NOTE — Progress Notes (Signed)
UR COMPLETED  

## 2012-03-01 NOTE — Transfer of Care (Signed)
Immediate Anesthesia Transfer of Care Note  Patient: Hunter Mcbride  Procedure(s) Performed: Procedure(s) (LRB) with comments: TOTAL SHOULDER ARTHROPLASTY (Right)  Patient Location: PACU  Anesthesia Type:General  Level of Consciousness: awake  Airway & Oxygen Therapy: Patient Spontanous Breathing and Patient connected to face mask oxygen  Post-op Assessment: Report given to PACU RN, Post -op Vital signs reviewed and stable and Patient moving all extremities X 4  Post vital signs: Reviewed and stable  Complications: No apparent anesthesia complications

## 2012-03-01 NOTE — Anesthesia Postprocedure Evaluation (Signed)
  Anesthesia Post-op Note  Patient: Hunter Mcbride  Procedure(s) Performed: Procedure(s) (LRB) with comments: TOTAL SHOULDER ARTHROPLASTY (Right)  Patient Location: PACU  Anesthesia Type:GA combined with regional for post-op pain  Level of Consciousness: awake, alert  and oriented  Airway and Oxygen Therapy: Patient Spontanous Breathing  Post-op Pain: none  Post-op Assessment: Post-op Vital signs reviewed  Post-op Vital Signs: stable  Complications: No apparent anesthesia complications

## 2012-03-01 NOTE — Anesthesia Procedure Notes (Addendum)
Anesthesia Regional Block:  Interscalene brachial plexus block  Pre-Anesthetic Checklist: ,, timeout performed, Correct Patient, Correct Site, Correct Laterality, Correct Procedure, Correct Position, site marked, Risks and benefits discussed, at surgeon's request and post-op pain management  Laterality: Upper and Right  Prep: chloraprep       Needles:  Injection technique: Single-shot  Needle Type: Stimulator Needle - 40      Needle Gauge: 22 and 22 G  Needle insertion depth: 2 cm   Additional Needles:  Procedures: nerve stimulator Interscalene brachial plexus block  Nerve Stimulator or Paresthesia:  Response: Twitch elicited, 0.5 mA, 0.3 ms,   Additional Responses:   Narrative:  Start time: 03/01/2012 7:05 AM End time: 03/01/2012 7:19 AM Injection made incrementally with aspirations every 5 mL.  Performed by: Personally  Anesthesiologist: Bartolo Darter, MD  Additional Notes: Block assessed prior to start of surgery  Interscalene brachial plexus block Procedure Name: Intubation Date/Time: 03/01/2012 7:44 AM Performed by: Sherilyn Banker Pre-anesthesia Checklist: Patient identified, Emergency Drugs available, Suction available, Patient being monitored and Timeout performed Patient Re-evaluated:Patient Re-evaluated prior to inductionOxygen Delivery Method: Circle system utilized Preoxygenation: Pre-oxygenation with 100% oxygen Intubation Type: IV induction Ventilation: Mask ventilation without difficulty Laryngoscope Size: Mac and 4 Grade View: Grade II Tube type: Oral Tube size: 7.5 mm Number of attempts: 1 Airway Equipment and Method: Stylet Placement Confirmation: ETT inserted through vocal cords under direct vision,  positive ETCO2 and breath sounds checked- equal and bilateral Secured at: 22 cm Tube secured with: Tape Dental Injury: Teeth and Oropharynx as per pre-operative assessment

## 2012-03-01 NOTE — Preoperative (Signed)
Beta Blockers   Reason not to administer Beta Blockers:Not Applicable 

## 2012-03-01 NOTE — Op Note (Signed)
[facility]                 ACCOUNT NO.:  [id number]  MEDICAL RECORD NO.:  [id number]  LOCATION:  5N01C                        FACILITY:  [facility]  PHYSICIAN:  Metta Clines. Dontae Minerva, M.D.  DATE OF BIRTH:  12/01/1945  DATE OF PROCEDURE: DATE OF DISCHARGE:                              OPERATIVE REPORT   ADDENDUM:  Jenetta Loges, P.A.-C was used as Environmental consultant throughout this case essential for help with positioning of the extremity, retraction, soft tissue manipulation, facilitation of implantation of the prosthesis, wound closure, and intraoperative decision making.     Metta Clines. Leeyah Heather, M.D.     KMS/MEDQ  D:  03/01/2012  T:  03/01/2012  Job:  [id number]

## 2012-03-01 NOTE — Op Note (Signed)
03/01/2012  10:07 AM  PATIENT:   Hunter Mcbride  67 y.o. male  PRE-OPERATIVE DIAGNOSIS:  OSTEOARTHRITIS RIGHT SHOULDER  POST-OPERATIVE DIAGNOSIS:  same  PROCEDURE:  R TSA 14 stem, 48X18 eccentric head, 48 cemented glenoid  SURGEON:  Calandria Mullings, Metta Clines M.D.  ASSISTANTS: Shuford pac   ANESTHESIA:   GET + ISB  EBL: 300  SPECIMEN:  none  Drains: none   PATIENT DISPOSITION:  PACU - hemodynamically stable.    PLAN OF CARE: Admit to inpatient   Dictation# [id number]

## 2012-03-01 NOTE — Op Note (Signed)
Hunter Mcbride, Hunter Mcbride                 ACCOUNT NO.:  [id number]  MEDICAL RECORD NO.:  [id number]  LOCATION:  [facility]                         FACILITY:  [facility]  PHYSICIAN:  Metta Clines. Orson Rho, M.D.  DATE OF BIRTH:  [birth year]  DATE OF PROCEDURE:  03/01/2012 DATE OF DISCHARGE:                              OPERATIVE REPORT   PREOPERATIVE DIAGNOSIS:  End-stage right shoulder osteoarthritis.  POSTOPERATIVE DIAGNOSIS:  End-stage right shoulder osteoarthritis.  PROCEDURE:  Right total shoulder arthroplasty utilizing a press-fit size 14 DePuy global stem with a 48 x 18 eccentric humeral head and a 48 pegged cemented glenoid.  SURGEON:  Metta Clines. Melinda Pottinger, M.D.  Terrence DupontOlivia Mackie A. Shuford, P.A.-C.  ANESTHESIA:  General endotracheal as well as an interscalene block.  ESTIMATED BLOOD LOSS:  300 mL.  DRAINS:  None.  HISTORY:  Mr. Hunter Mcbride is a 67 year old gentleman who has had progressive increasing right shoulder pain and functional limitations related to end- stage glenohumeral joint osteoarthrosis.  He is brought to the operating room at this time for planned right total shoulder arthroplasty.  Preoperatively I counseled Mr. Hunter Mcbride regarding treatment options and risks versus benefits thereof.  Possible surgical complications were reviewed including potential for bleeding, infection, neurovascular injury, persistent pain, loss of motion, anesthetic complication, failure of the implant, possible need for additional surgery.  He understands and accepts and agrees to planned procedure.  PROCEDURE IN DETAIL:  After undergoing routine preop evaluation, the patient received prophylactic antibiotics and an interscalene block was established in the holding area by the Anesthesia Department.  Placed supine on the operating table, underwent smooth induction of a general anesthetic with endotracheal anesthesia.  Placed in beach-chair position and appropriately padded and protected.  The right shoulder  girdle region was then sterilely prepped and draped in standard fashion.  Time- out was called.  An anterior deltopectoral approach to the right shoulder was made through an approximately 15 cm incision beginning at the coracoid and extending laterally and distally.  Skin flaps were elevated sharply and electrocautery was used for hemostasis.  Dissection was carried deeply and cephalic vein identified, mobilized, and retracted lateral to the deltoid.  The interval was then developed distally.  Upper 1.5 cm pec major was tenotomized to enhance exposure and the adhesions beneath the deltoid divided and retractors were placed.  The conjoined tendon was then identified, mobilized, and retracted medially.  The biceps then was identified.  The bicipital canal unroofed.  Biceps tendon was then tenotomized for later tenodesis. Proximal incision was then made from lateral to medial along the upper border of subscapularis towards the base of the coracoid process, and then the subscapularis was tenotomized leaving a distal 1 to 1.5 cm cuff of tissue on the lesser tuberosity for later repair of the subscapularis.  The free margin was tagged with a series of #2 FiberWire grasping sutures.  The subscapularis was then mobilized and retracted medially.  This allowed the humeral head to be delivered through the wound and we did divide the capsule from the anterior, inferior, and posterior aspects of the humeral head.  I released the soft tissue attachments to gain full exposure of the humeral head through  the wound. Rotator cuff was carefully protected superiorly and posteriorly.  We then utilized the extramedullary guide to outline our proposed humeral head resection, which was then performed with an oscillating saw at approximately 30 degrees of retroversion.  With the head resected, we then saw a very prominent anterior and inferior osteophyte and this was removed with a combination of osteotomes and a  rongeur.  At this point, we then gained access to the humeral medullary canal, performed hand reaming up to size 14 and it did very dense bone making this challenging for procedure.  We then used the cookie cutter broach to finish initial broaching into the proximal humerus and then we performed sequential broaching up to size 14 and again this was challenging due to the density of his bone.  We gained proper fit and fixation.  Once this was completed, we then exposed the glenoid with combination of Fukuda, pitchfork, and snake tongue retractors.  We performed a circumferential labral resection and also removed the proximal stump of the biceps and gained complete exposure of the entire periphery of the glenoid.  Care was taken to protect the contents of the axillary pouch.  The subscapularis was completely mobilized as well.  A guide pin was then directed into the center of the glenoid and we did determine 48 was appropriate size and coverage and then we used the 48 reamer to create a smooth surface, removing cartilage and down to the subchondral bone with excellent position and stability.  The central PEG was then drilled and then the guide was used to create the peripheral peg holes.  Once this was completed, the glenoid was irrigated.  Trial was placed showing excellent fit.  Trial was removed.  The glenoid was irrigated, dried, and cement was mixed at the appropriate consistency.  Cement was introduced into the peripheral peg holes and the final 48 glenoid was then impacted into position with excellent fit and fixation.  We then returned our attention to the proximal humerus where the canal was irrigated.  I impacted a size 14 stem to the appropriate depth obtaining excellent fixation.  We then performed trial reductions and the 48 x 18 eccentric head had the best coverage and good soft tissue balance with 50% translation in the glenoid.  The final 48 x 18 eccentric head was placed in  proper position and impacted securely.  Final reduction was performed.  Overall construct was much to our satisfaction, excellent stability, and good soft tissue balance.  The subscapularis was then repaired back to the lesser tuberosity with the FiberWire sutures.  The rotator interval was then closed with 2 figure-of-eight FiberWire sutures.  The biceps tendon was then tenodesed with #2 FiberWire at the inferior aspect of the subscapularis level.  The overall soft tissue balance and shoulder mobility was much to our satisfaction.  The wound was irrigated.  I should mention that during the glenoid exposure, the cephalic vein was compromised and we went ahead and ligated the cephalic vein.  Final irrigation was then completed and hemostasis was obtained. The deltopectoral interval was then reapproximated with a series of #1 Vicryl sutures, 2-0 Vicryl was used for the subcu layer, and intracuticular 3-0 Monocryl for the skin followed by Steri-Strips.  Dry dressing was then applied.  The right arm was placed in sling.  The patient was awakened, extubated, and taken to the recovery room in stable condition.     Metta Clines. Cathey Fredenburg, M.D.     KMS/MEDQ  D:  03/01/2012  T:  03/01/2012  Job:  [id number]

## 2012-03-01 NOTE — Anesthesia Preprocedure Evaluation (Addendum)
Anesthesia Evaluation  Patient identified by MRN, date of birth, ID band Patient awake    Reviewed: Allergy & Precautions, H&P , NPO status , Patient's Chart, lab work & pertinent test results  Airway Mallampati: II  Neck ROM: Full    Dental  (+) Teeth Intact   Pulmonary asthma , sleep apnea ,  Noncompilant c machine breath sounds clear to auscultation        Cardiovascular hypertension, Pt. on medications Rhythm:Regular Rate:Normal  He denies cardiac problems   Neuro/Psych negative neurological ROS     GI/Hepatic Neg liver ROS, GERD-  ,  Endo/Other  diabetes  Renal/GU negative Renal ROS     Musculoskeletal  (+) Arthritis -,   Abdominal   Peds  Hematology negative hematology ROS (+)   Anesthesia Other Findings   Reproductive/Obstetrics                          Anesthesia Physical Anesthesia Plan  ASA: III  Anesthesia Plan: General   Post-op Pain Management:    Induction: Intravenous  Airway Management Planned: Oral ETT  Additional Equipment:   Intra-op Plan:   Post-operative Plan: Extubation in OR  Informed Consent:   Dental advisory given  Plan Discussed with: CRNA and Surgeon  Anesthesia Plan Comments:         Anesthesia Quick Evaluation

## 2012-03-02 ENCOUNTER — Encounter (HOSPITAL_COMMUNITY): Payer: Self-pay | Admitting: Orthopedic Surgery

## 2012-03-02 DIAGNOSIS — M19019 Primary osteoarthritis, unspecified shoulder: Secondary | ICD-10-CM | POA: Diagnosis present

## 2012-03-02 LAB — GLUCOSE, CAPILLARY

## 2012-03-02 MED ORDER — INSULIN ASPART 100 UNIT/ML ~~LOC~~ SOLN
6.0000 [IU] | Freq: Once | SUBCUTANEOUS | Status: AC
  Administered 2012-03-02: 6 [IU] via SUBCUTANEOUS

## 2012-03-02 MED ORDER — CYCLOBENZAPRINE HCL 10 MG PO TABS: 10.0000 mg | ORAL_TABLET | Freq: Three times a day (TID) | ORAL | Status: DC | PRN

## 2012-03-02 MED ORDER — PROMETHAZINE HCL 25 MG PO TABS: 25.0000 mg | ORAL_TABLET | Freq: Four times a day (QID) | ORAL | Status: DC | PRN

## 2012-03-02 MED ORDER — HYDROMORPHONE HCL 2 MG PO TABS: 2.0000 mg | ORAL_TABLET | ORAL | Status: DC | PRN

## 2012-03-02 MED ORDER — HYDROMORPHONE HCL 2 MG PO TABS
2.0000 mg | ORAL_TABLET | ORAL | Status: DC | PRN
  Administered 2012-03-02: 2 mg via ORAL
  Filled 2012-03-02: qty 1

## 2012-03-02 NOTE — Evaluation (Signed)
Occupational Therapy Evaluation Patient Details Name: Hunter Mcbride MRN: [id number] DOB: Aug 31, 1945 Today's Date: 03/02/2012 Time: [id number] OT Time Calculation (min): 36 min  OT Assessment / Plan / Recommendation Clinical Impression  Pt doing well and will be d/c today home with family. No further acute OT needed at this time, all education completed    OT Assessment  Progress rehab of shoulder as ordered by MD at follow-up appointment    Follow Up Recommendations  No OT follow up    Barriers to Discharge  None    Equipment Recommendations    None By OT   Recommendations for Other Services    Frequency       Precautions / Restrictions Precautions Precautions: Shoulder Type of Shoulder Precautions: Sling on at all times, except when bathing, dressing and exercises per protocol Precaution Booklet Issued: Yes (comment) Precaution Comments: [place] protocol  Required Braces or Orthoses: Other Brace/Splint Other Brace/Splint: sling R UE Restrictions Weight Bearing Restrictions: Yes RUE Weight Bearing: Non weight bearing   Pertinent Vitals/Pain 5/10    ADL  Grooming: Performed;Wash/dry hands;Wash/dry face Where Assessed - Grooming: Unsupported standing Upper Body Bathing: Minimal assistance Where Assessed - Upper Body Bathing: Unsupported sitting Lower Body Bathing: Minimal assistance Where Assessed - Lower Body Bathing: Supported sit to stand;Unsupported sitting Upper Body Dressing: Performed;Maximal assistance Where Assessed - Upper Body Dressing: Unsupported sitting Lower Body Dressing: Performed;Moderate assistance Where Assessed - Lower Body Dressing: Unsupported sitting;Supported sit to stand Toilet Transfer: Performed;Supervision/safety Toilet Transfer Method: Other (comment) (ambulating) Toilet Transfer Equipment: Regular height toilet Toileting - Clothing Manipulation and Hygiene: Performed;Moderate assistance Where Assessed - Toileting Clothing Manipulation  and Hygiene: Standing Equipment Used: Schnider-handled sponge;Cercone-handled shoe horn;Reacher    OT Diagnosis:    OT Problem List:   OT Treatment Interventions:     OT Goals    Visit Information  Last OT Received On: 03/02/12    Subjective Data  Subjective: " I am going home today " Patient Stated Goal: To return home   Prior Chauncey Lives With: Spouse Available Help at Discharge: Family;Available 24 hours/day Type of Home: House Home Access: Stairs to enter [employer] of Steps: 4 Entrance Stairs-Rails: None Home Layout: One level Bathroom Shower/Tub: Multimedia programmer: Standard Home Adaptive Equipment: Shower chair without back;None Additional Comments: pt and family provided with education and demo of ADL A/E for home use Prior Function Level of Independence: Independent Able to Take Stairs?: Yes Driving: Yes Vocation: Part time employment Communication Communication: No difficulties Dominant Hand: Right         Vision/Perception Vision - Assessment Eye Alignment: Within Functional Limits Perception Perception: Within Functional Limits   Cognition  Overall Cognitive Status: Appears within functional limits for tasks assessed/performed Arousal/Alertness: Awake/alert Orientation Level: Appears intact for tasks assessed Behavior During Session: [facility] for tasks performed    Extremity/Trunk Assessment Right Upper Extremity Assessment RUE ROM/Strength/Tone: Due to precautions (elbow, wrist, hand WFL) Left Upper Extremity Assessment LUE ROM/Strength/Tone: Within functional levels     Mobility Transfers Transfers: Sit to Stand;Stand to Sit Sit to Stand: 5: Supervision Stand to Sit: 5: Supervision     Shoulder Instructions Donning/doffing shirt without moving shoulder: Patient able to independently direct caregiver;Caregiver independent with task Method for sponge bathing under operated UE: Caregiver independent  with task;Patient able to independently direct caregiver Donning/doffing sling/immobilizer: Caregiver independent with task;Patient able to independently direct caregiver Correct positioning of sling/immobilizer: Caregiver independent with task;Patient able to independently direct caregiver  Pendulum exercises (written home exercise program): Supervision/safety ROM for elbow, wrist and digits of operated UE: Supervision/safety Sling wearing schedule (on at all times/off for ADL's): Caregiver independent with task;Patient able to independently direct caregiver Proper positioning of operated UE when showering: Caregiver independent with task;Patient able to independently direct caregiver Positioning of UE while sleeping: Caregiver independent with task;Patient able to independently direct caregiver   Exercise General Exercises - Upper Extremity Shoulder Flexion: Other (comment) (pendulum exercises only) Elbow Flexion: AROM;Right;5 reps Elbow Extension: AROM;Right;5 reps Wrist Flexion: AROM;Right;5 reps Wrist Extension: AROM;Right;5 reps Digit Composite Flexion: AROM;Right;5 reps Composite Extension: AROM;Right;5 reps Shoulder Exercises Pendulum Exercise: AROM;Right;10 reps   Balance Balance Balance Assessed: No   End of Session OT - End of Session Activity Tolerance: Patient tolerated treatment well Patient left: in chair;with call bell/phone within reach;with family/visitor present  GO     Britt Bottom 03/02/2012, 10:03 AM

## 2012-03-02 NOTE — Discharge Summary (Signed)
PATIENT ID:      Hunter Mcbride  MRN:     [id number] DOB/AGE:    05-15-1945 / 67 y.o.     DISCHARGE SUMMARY  ADMISSION DATE:    03/01/2012 DISCHARGE DATE:  03/02/2012  ADMISSION DIAGNOSIS: OSTEOARTHRITIS RIGHT SHOULDER Past Medical History  Diagnosis Date  . Chest pain   . Hyperlipidemia   . Hypertension   . Diabetes mellitus   . Asthma   . Gout   . Sleep apnea     DONE 7-8 YRS AGO [facility] (DOES NOT WEAR CPAP  . GERD (gastroesophageal reflux disease)   . Arthritis     OA    DISCHARGE DIAGNOSIS:   Principal Problem:  *Shoulder arthritis   PROCEDURE: Procedure(s): TOTAL SHOULDER ARTHROPLASTY on 03/01/2012  CONSULTS:  none   HISTORY:  See H&P in chart.  HOSPITAL COURSE:  Hunter Mcbride is a 67 y.o. admitted on 03/01/2012 with a chief complaint of R shoulder pain, and found to have a diagnosis of [place].  They were brought to the operating room on 03/01/2012 and underwent Procedure(s): TOTAL SHOULDER ARTHROPLASTY.    They were given perioperative antibiotics: Anti-infectives     Start     Dose/Rate Route Frequency Ordered Stop   03/01/12 1600   ceFAZolin (ANCEF) IVPB 1 g/50 mL premix        1 g 100 mL/hr over 30 Minutes Intravenous Every 6 hours 03/01/12 1457 03/02/12 0530   03/01/12 0600   ceFAZolin (ANCEF) IVPB 2 g/50 mL premix        2 g 100 mL/hr over 30 Minutes Intravenous On call to O.R. 02/29/12 1410 03/01/12 0745        .  Patient underwent the above named procedure and tolerated it well. The following day they were hemodynamically stable and pain was controlled on oral analgesics. They were neurovascularly intact to the operative extremity. OT was ordered and worked with patient per protocol. They were medically and orthopaedically stable for discharge on .     DIAGNOSTIC STUDIES:  RECENT RADIOGRAPHIC STUDIES :  Dg Chest 2 View  02/27/2012  *RADIOLOGY REPORT*  Clinical Data: Preop  CHEST - 2 VIEW  Comparison: 06/05/2008  Findings: None  cardiomediastinal silhouette is stable.  No acute infiltrate or pleural effusion.  No pulmonary edema.  Degenerative changes thoracic spine again noted.  IMPRESSION: No active disease.  Degenerative changes thoracic spine again noted.   Original Report Authenticated By: Lahoma Crocker, M.D.     RECENT VITAL SIGNS:  Patient Vitals for the past 24 hrs:  BP Temp Temp src Pulse Resp SpO2  03/02/12 0503 137/72 mmHg 98.5 F (36.9 C) Oral 81  18  98 %  03/01/12 2316 136/83 mmHg 98.4 F (36.9 C) - 101  20  96 %  03/01/12 1637 146/82 mmHg 98 F (36.7 C) - 86  20  98 %  03/01/12 1437 149/91 mmHg 98.1 F (36.7 C) - 98  20  98 %  03/01/12 1330 141/88 mmHg 97 F (36.1 C) - 100  20  100 %  03/01/12 1312 131/81 mmHg - - 95  24  100 %  03/01/12 1300 - - - 85  20  99 %  03/01/12 1256 143/77 mmHg - - - - -  03/01/12 1245 - - - 82  13  99 %  03/01/12 1241 133/76 mmHg - - - - -  03/01/12 1230 - - - 85  20  100 %  03/01/12  1225 153/82 mmHg - - - - -  03/01/12 1210 136/79 mmHg - - - - -  03/01/12 1203 - - - 85  20  100 %  03/01/12 1200 - - - 77  13  99 %  03/01/12 1158 147/93 mmHg - - 85  14  100 %  03/01/12 1145 - - - 76  17  99 %  03/01/12 1140 147/72 mmHg - - - - -  03/01/12 1130 - 96.8 F (36 C) - 78  23  100 %  03/01/12 1125 131/76 mmHg - - - - -  03/01/12 1111 141/80 mmHg - - - - -  03/01/12 1105 - - - 78  14  98 %  03/01/12 1056 146/81 mmHg - - - - -  03/01/12 1050 - - - 78  20  100 %  03/01/12 1045 - 96.5 F (35.8 C) - 76  14  100 %  03/01/12 1040 145/69 mmHg - - 78  16  100 %  .  RECENT EKG RESULTS:    Orders placed in visit on 10/14/10  . EKG 12-LEAD    DISCHARGE INSTRUCTIONS:    DISCHARGE MEDICATIONS:     Medication List     As of 03/02/2012  8:23 AM    STOP taking these medications         oxyCODONE 10 MG 12 hr tablet   Commonly known as: OXYCONTIN      TAKE these medications         aspirin 81 MG tablet   Take 81 mg by mouth daily.      celecoxib 200 MG capsule    Commonly known as: CELEBREX   Take 200 mg by mouth 2 (two) times daily as needed. For pain      chlorthalidone 25 MG tablet   Commonly known as: HYGROTON   Take 25 mg by mouth daily.      cyclobenzaprine 10 MG tablet   Commonly known as: FLEXERIL   Take 1 tablet (10 mg total) by mouth 3 (three) times daily as needed for muscle spasms.      diltiazem 180 MG 24 hr capsule   Commonly known as: CARDIZEM CD   Take 180 mg by mouth daily.      gabapentin 300 MG capsule   Commonly known as: NEURONTIN   Take 300 mg by mouth at bedtime.      HYDROmorphone 2 MG tablet   Commonly known as: DILAUDID   Take 1-2 tablets (2-4 mg total) by mouth every 4 (four) hours as needed for pain.      irbesartan 300 MG tablet   Commonly known as: AVAPRO   Take 150 mg by mouth daily.      lidocaine 5 %   Commonly known as: LIDODERM   Place 1 patch onto the skin daily. Remove & Discard patch within 12 hours or as directed by MD      metFORMIN 500 MG tablet   Commonly known as: GLUCOPHAGE   Take 500 mg by mouth 2 (two) times daily with a meal.      nitroGLYCERIN 0.4 MG SL tablet   Commonly known as: NITROSTAT   Place 0.4 mg under the tongue every 5 (five) minutes as needed. For chest pain      pantoprazole 40 MG tablet   Commonly known as: PROTONIX   Take 40 mg by mouth daily.      promethazine 25 MG tablet   Commonly  known as: PHENERGAN   Take 1 tablet (25 mg total) by mouth every 6 (six) hours as needed for nausea.      rosuvastatin 10 MG tablet   Commonly known as: CRESTOR   Take 5 mg by mouth every other day.      TYLENOL ARTHRITIS PAIN 650 MG CR tablet   Generic drug: acetaminophen   Take 1,300 mg by mouth 2 (two) times daily as needed. For pain        FOLLOW UP VISIT:       Follow-up Information    Follow up with SUPPLE,KEVIN M, MD. (call to be seen in 2 weeks)    Contact information:   [address], [address] [place]  [address] [id number]          DISCHARGE [id number]  DISPOSITION: Excellent  DISCHARGE CONDITION:  {Good  Gregg Winchell for Dr. Justice Britain 03/02/2012, 8:23 AM

## 2012-03-02 NOTE — Plan of Care (Signed)
Problem: Phase II Progression Outcomes Goal: Progress activity as tolerated unless otherwise ordered All education completed and no further acute OT needed at this time. Pt to d/c home with family today and f/u with physician in approx 2 weeks

## 2012-03-14 DIAGNOSIS — Z96619 Presence of unspecified artificial shoulder joint: Secondary | ICD-10-CM | POA: Diagnosis not present

## 2012-03-19 DIAGNOSIS — Z96619 Presence of unspecified artificial shoulder joint: Secondary | ICD-10-CM | POA: Diagnosis not present

## 2012-03-22 DIAGNOSIS — M19019 Primary osteoarthritis, unspecified shoulder: Secondary | ICD-10-CM | POA: Diagnosis not present

## 2012-03-26 DIAGNOSIS — M19019 Primary osteoarthritis, unspecified shoulder: Secondary | ICD-10-CM | POA: Diagnosis not present

## 2012-03-31 ENCOUNTER — Other Ambulatory Visit: Payer: Self-pay

## 2012-04-04 DIAGNOSIS — M19019 Primary osteoarthritis, unspecified shoulder: Secondary | ICD-10-CM | POA: Diagnosis not present

## 2012-04-06 DIAGNOSIS — M19019 Primary osteoarthritis, unspecified shoulder: Secondary | ICD-10-CM | POA: Diagnosis not present

## 2012-04-09 DIAGNOSIS — M1990 Unspecified osteoarthritis, unspecified site: Secondary | ICD-10-CM | POA: Diagnosis not present

## 2012-04-09 DIAGNOSIS — E785 Hyperlipidemia, unspecified: Secondary | ICD-10-CM | POA: Diagnosis not present

## 2012-04-09 DIAGNOSIS — I10 Essential (primary) hypertension: Secondary | ICD-10-CM | POA: Diagnosis not present

## 2012-04-09 DIAGNOSIS — M19019 Primary osteoarthritis, unspecified shoulder: Secondary | ICD-10-CM | POA: Diagnosis not present

## 2012-04-09 DIAGNOSIS — E119 Type 2 diabetes mellitus without complications: Secondary | ICD-10-CM | POA: Diagnosis not present

## 2012-04-12 DIAGNOSIS — M19019 Primary osteoarthritis, unspecified shoulder: Secondary | ICD-10-CM | POA: Diagnosis not present

## 2012-04-16 DIAGNOSIS — M19019 Primary osteoarthritis, unspecified shoulder: Secondary | ICD-10-CM | POA: Diagnosis not present

## 2012-04-18 DIAGNOSIS — M19019 Primary osteoarthritis, unspecified shoulder: Secondary | ICD-10-CM | POA: Diagnosis not present

## 2012-04-23 DIAGNOSIS — M19019 Primary osteoarthritis, unspecified shoulder: Secondary | ICD-10-CM | POA: Diagnosis not present

## 2012-04-26 DIAGNOSIS — M19019 Primary osteoarthritis, unspecified shoulder: Secondary | ICD-10-CM | POA: Diagnosis not present

## 2012-04-30 DIAGNOSIS — Z96619 Presence of unspecified artificial shoulder joint: Secondary | ICD-10-CM | POA: Diagnosis not present

## 2012-05-03 DIAGNOSIS — M19019 Primary osteoarthritis, unspecified shoulder: Secondary | ICD-10-CM | POA: Diagnosis not present

## 2012-05-11 DIAGNOSIS — M19019 Primary osteoarthritis, unspecified shoulder: Secondary | ICD-10-CM | POA: Diagnosis not present

## 2012-05-17 DIAGNOSIS — M19019 Primary osteoarthritis, unspecified shoulder: Secondary | ICD-10-CM | POA: Diagnosis not present

## 2012-05-21 DIAGNOSIS — M19019 Primary osteoarthritis, unspecified shoulder: Secondary | ICD-10-CM | POA: Diagnosis not present

## 2012-05-24 DIAGNOSIS — M19019 Primary osteoarthritis, unspecified shoulder: Secondary | ICD-10-CM | POA: Diagnosis not present

## 2012-05-28 DIAGNOSIS — M19019 Primary osteoarthritis, unspecified shoulder: Secondary | ICD-10-CM | POA: Diagnosis not present

## 2012-05-31 DIAGNOSIS — M19019 Primary osteoarthritis, unspecified shoulder: Secondary | ICD-10-CM | POA: Diagnosis not present

## 2012-06-04 DIAGNOSIS — M19019 Primary osteoarthritis, unspecified shoulder: Secondary | ICD-10-CM | POA: Diagnosis not present

## 2012-06-07 DIAGNOSIS — M19019 Primary osteoarthritis, unspecified shoulder: Secondary | ICD-10-CM | POA: Diagnosis not present

## 2012-06-11 DIAGNOSIS — M19019 Primary osteoarthritis, unspecified shoulder: Secondary | ICD-10-CM | POA: Diagnosis not present

## 2012-06-14 DIAGNOSIS — M19019 Primary osteoarthritis, unspecified shoulder: Secondary | ICD-10-CM | POA: Diagnosis not present

## 2012-06-18 DIAGNOSIS — M19019 Primary osteoarthritis, unspecified shoulder: Secondary | ICD-10-CM | POA: Diagnosis not present

## 2012-07-19 DIAGNOSIS — M19049 Primary osteoarthritis, unspecified hand: Secondary | ICD-10-CM | POA: Diagnosis not present

## 2012-08-07 DIAGNOSIS — E119 Type 2 diabetes mellitus without complications: Secondary | ICD-10-CM | POA: Diagnosis not present

## 2012-08-13 DIAGNOSIS — Z96619 Presence of unspecified artificial shoulder joint: Secondary | ICD-10-CM | POA: Diagnosis not present

## 2012-09-19 ENCOUNTER — Other Ambulatory Visit: Payer: Self-pay

## 2012-10-17 DIAGNOSIS — L0390 Cellulitis, unspecified: Secondary | ICD-10-CM | POA: Insufficient documentation

## 2012-10-17 DIAGNOSIS — Z6830 Body mass index (BMI) 30.0-30.9, adult: Secondary | ICD-10-CM | POA: Diagnosis not present

## 2012-10-17 DIAGNOSIS — L0291 Cutaneous abscess, unspecified: Secondary | ICD-10-CM | POA: Diagnosis not present

## 2012-10-22 DIAGNOSIS — Z23 Encounter for immunization: Secondary | ICD-10-CM | POA: Diagnosis not present

## 2012-10-22 DIAGNOSIS — E119 Type 2 diabetes mellitus without complications: Secondary | ICD-10-CM | POA: Diagnosis not present

## 2012-10-22 DIAGNOSIS — Z6830 Body mass index (BMI) 30.0-30.9, adult: Secondary | ICD-10-CM | POA: Diagnosis not present

## 2012-10-22 DIAGNOSIS — I10 Essential (primary) hypertension: Secondary | ICD-10-CM | POA: Diagnosis not present

## 2012-10-22 DIAGNOSIS — J45909 Unspecified asthma, uncomplicated: Secondary | ICD-10-CM | POA: Diagnosis not present

## 2012-10-22 DIAGNOSIS — L0291 Cutaneous abscess, unspecified: Secondary | ICD-10-CM | POA: Diagnosis not present

## 2012-12-20 ENCOUNTER — Other Ambulatory Visit: Payer: Self-pay

## 2013-01-07 DIAGNOSIS — E119 Type 2 diabetes mellitus without complications: Secondary | ICD-10-CM | POA: Diagnosis not present

## 2013-01-07 DIAGNOSIS — Z125 Encounter for screening for malignant neoplasm of prostate: Secondary | ICD-10-CM | POA: Diagnosis not present

## 2013-01-07 DIAGNOSIS — E785 Hyperlipidemia, unspecified: Secondary | ICD-10-CM | POA: Diagnosis not present

## 2013-01-07 DIAGNOSIS — I10 Essential (primary) hypertension: Secondary | ICD-10-CM | POA: Diagnosis not present

## 2013-01-14 DIAGNOSIS — J45909 Unspecified asthma, uncomplicated: Secondary | ICD-10-CM | POA: Diagnosis not present

## 2013-01-14 DIAGNOSIS — Z23 Encounter for immunization: Secondary | ICD-10-CM | POA: Diagnosis not present

## 2013-01-14 DIAGNOSIS — G5600 Carpal tunnel syndrome, unspecified upper limb: Secondary | ICD-10-CM | POA: Diagnosis not present

## 2013-01-14 DIAGNOSIS — E669 Obesity, unspecified: Secondary | ICD-10-CM | POA: Diagnosis not present

## 2013-01-14 DIAGNOSIS — M1990 Unspecified osteoarthritis, unspecified site: Secondary | ICD-10-CM | POA: Diagnosis not present

## 2013-01-14 DIAGNOSIS — F341 Dysthymic disorder: Secondary | ICD-10-CM | POA: Diagnosis not present

## 2013-01-14 DIAGNOSIS — Z0000 Encounter for general adult medical examination without abnormal findings: Secondary | ICD-10-CM | POA: Diagnosis not present

## 2013-01-14 DIAGNOSIS — E119 Type 2 diabetes mellitus without complications: Secondary | ICD-10-CM | POA: Diagnosis not present

## 2013-01-14 DIAGNOSIS — Z1331 Encounter for screening for depression: Secondary | ICD-10-CM | POA: Diagnosis not present

## 2013-01-14 DIAGNOSIS — M25519 Pain in unspecified shoulder: Secondary | ICD-10-CM | POA: Diagnosis not present

## 2013-01-23 DIAGNOSIS — Z1212 Encounter for screening for malignant neoplasm of rectum: Secondary | ICD-10-CM | POA: Diagnosis not present

## 2013-03-06 DIAGNOSIS — M19049 Primary osteoarthritis, unspecified hand: Secondary | ICD-10-CM | POA: Diagnosis not present

## 2013-03-17 HISTORY — PX: JOINT REPLACEMENT: SHX530

## 2013-03-18 DIAGNOSIS — Z96619 Presence of unspecified artificial shoulder joint: Secondary | ICD-10-CM | POA: Diagnosis not present

## 2013-03-18 DIAGNOSIS — M19049 Primary osteoarthritis, unspecified hand: Secondary | ICD-10-CM | POA: Diagnosis not present

## 2013-03-26 DIAGNOSIS — G8918 Other acute postprocedural pain: Secondary | ICD-10-CM | POA: Diagnosis not present

## 2013-03-26 DIAGNOSIS — M19039 Primary osteoarthritis, unspecified wrist: Secondary | ICD-10-CM | POA: Diagnosis not present

## 2013-03-26 DIAGNOSIS — M19049 Primary osteoarthritis, unspecified hand: Secondary | ICD-10-CM | POA: Diagnosis not present

## 2013-04-08 DIAGNOSIS — M19049 Primary osteoarthritis, unspecified hand: Secondary | ICD-10-CM | POA: Diagnosis not present

## 2013-04-22 DIAGNOSIS — M19049 Primary osteoarthritis, unspecified hand: Secondary | ICD-10-CM | POA: Diagnosis not present

## 2013-04-22 DIAGNOSIS — Z4789 Encounter for other orthopedic aftercare: Secondary | ICD-10-CM | POA: Diagnosis not present

## 2013-04-29 DIAGNOSIS — H2510 Age-related nuclear cataract, unspecified eye: Secondary | ICD-10-CM | POA: Diagnosis not present

## 2013-04-29 DIAGNOSIS — H40019 Open angle with borderline findings, low risk, unspecified eye: Secondary | ICD-10-CM | POA: Diagnosis not present

## 2013-05-03 DIAGNOSIS — G4733 Obstructive sleep apnea (adult) (pediatric): Secondary | ICD-10-CM | POA: Diagnosis not present

## 2013-05-03 DIAGNOSIS — G4730 Sleep apnea, unspecified: Secondary | ICD-10-CM | POA: Diagnosis not present

## 2013-05-06 DIAGNOSIS — M19049 Primary osteoarthritis, unspecified hand: Secondary | ICD-10-CM | POA: Diagnosis not present

## 2013-05-07 DIAGNOSIS — H903 Sensorineural hearing loss, bilateral: Secondary | ICD-10-CM | POA: Diagnosis not present

## 2013-05-13 DIAGNOSIS — M25649 Stiffness of unspecified hand, not elsewhere classified: Secondary | ICD-10-CM | POA: Diagnosis not present

## 2013-05-20 DIAGNOSIS — M25649 Stiffness of unspecified hand, not elsewhere classified: Secondary | ICD-10-CM | POA: Diagnosis not present

## 2013-05-27 DIAGNOSIS — Z6831 Body mass index (BMI) 31.0-31.9, adult: Secondary | ICD-10-CM | POA: Diagnosis not present

## 2013-05-27 DIAGNOSIS — R5381 Other malaise: Secondary | ICD-10-CM | POA: Diagnosis not present

## 2013-05-27 DIAGNOSIS — R5383 Other fatigue: Secondary | ICD-10-CM | POA: Diagnosis not present

## 2013-05-27 DIAGNOSIS — J45909 Unspecified asthma, uncomplicated: Secondary | ICD-10-CM | POA: Diagnosis not present

## 2013-05-27 DIAGNOSIS — Z79899 Other long term (current) drug therapy: Secondary | ICD-10-CM | POA: Diagnosis not present

## 2013-05-27 DIAGNOSIS — I10 Essential (primary) hypertension: Secondary | ICD-10-CM | POA: Diagnosis not present

## 2013-05-27 DIAGNOSIS — M25649 Stiffness of unspecified hand, not elsewhere classified: Secondary | ICD-10-CM | POA: Diagnosis not present

## 2013-05-27 DIAGNOSIS — G4733 Obstructive sleep apnea (adult) (pediatric): Secondary | ICD-10-CM | POA: Diagnosis not present

## 2013-05-27 DIAGNOSIS — F341 Dysthymic disorder: Secondary | ICD-10-CM | POA: Diagnosis not present

## 2013-05-27 DIAGNOSIS — E119 Type 2 diabetes mellitus without complications: Secondary | ICD-10-CM | POA: Diagnosis not present

## 2013-05-28 DIAGNOSIS — E291 Testicular hypofunction: Secondary | ICD-10-CM | POA: Insufficient documentation

## 2013-06-03 ENCOUNTER — Encounter: Payer: Self-pay | Admitting: Cardiovascular Disease

## 2013-06-03 ENCOUNTER — Ambulatory Visit (INDEPENDENT_AMBULATORY_CARE_PROVIDER_SITE_OTHER): Payer: Medicare Other | Admitting: Cardiovascular Disease

## 2013-06-03 DIAGNOSIS — I10 Essential (primary) hypertension: Secondary | ICD-10-CM | POA: Diagnosis not present

## 2013-06-03 DIAGNOSIS — E785 Hyperlipidemia, unspecified: Secondary | ICD-10-CM | POA: Diagnosis not present

## 2013-06-03 DIAGNOSIS — R0789 Other chest pain: Secondary | ICD-10-CM

## 2013-06-03 MED ORDER — NITROGLYCERIN 0.4 MG SL SUBL: 0.4000 mg | SUBLINGUAL_TABLET | SUBLINGUAL | Status: DC | PRN

## 2013-06-03 MED ORDER — ISOSORBIDE MONONITRATE ER 30 MG PO TB24: 30.0000 mg | ORAL_TABLET | Freq: Every day | ORAL | Status: DC

## 2013-06-03 NOTE — Assessment & Plan Note (Addendum)
Hunter Mcbride presents with some intermittent episodes of chest discomfort. These typically happen with exertion but at other times he'll be able to exert himself quite vigorously without any chest discomfort. He's had intermittent chest pains for years and has had several negative stress test.  He now has  small Q waves in his inferior leads. The Q in III is significant,  Leads II and aVf and borderline significant.  He had tiny Q waves on an ECG several years ago.   Am concerned about his current episodes of chest tightness. He states that these chest tightness episodes are different than his previous episodes of chest discomfort.  We will schedule him for a stress Myoview study. We'll start him on isosorbide mononitrate 30 mg a day.  He's also on diltiazem. I will see him  again in 3 months. I've advised him to call me sooner if these pains worsened.

## 2013-06-03 NOTE — Patient Instructions (Addendum)
Your physician has requested that you have en exercise stress myoview. For further information please visit [email or web address]. Please follow instruction sheet, as given.  Your physician has recommended you make the following change in your medication:  START Imdur 30 mg once daily Take Nitroglycerin sublingual tablets as needed for chest pain in addition to the Imdur.   Your physician wants you to follow-up in: 3 months with Dr. Acie Fredrickson.  You will receive a reminder letter in the mail two months in advance. If you don't receive a letter, please call our office to schedule the follow-up appointment.

## 2013-06-03 NOTE — Progress Notes (Signed)
Hunter Mcbride Date of Birth  [birth year] [address]    [place]   [place], [place]  [address]    [place], [place]  [address] [phone]  Fax  [phone]  [phone]  Fax [phone]  Problem list: 1. History of chest pain-negative stress Myoview study in April, 2010 2. Hyperlipidemia 3. Hypertension 4. Diabetes mellitus 5. Sleep apnea  History of Present Illness:  Hunter Mcbride is doing well.  His quite active. 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 He's not had any episodes of chest pain, shortness breath, syncope, or presyncope.  06/05/2013:    Hunter Mcbride is doing well.  He has been [place] hunting this am.   No CP or dyspnea.  He has noticed that he has some mild vague chest and arm ache - especially with walking.  He can usually walk through the discomfort and the pain will eventually resolve.  He noticed this discomfort this am while hunting.   He has also noticed this while push mowing.   These have been going on for about 1 year.    He had a myoview in 2012 which was negative.     Current Outpatient Prescriptions on File Prior to Visit  Medication Sig Dispense Refill  . acetaminophen (TYLENOL ARTHRITIS PAIN) 650 MG CR tablet Take 1,300 mg by mouth 2 (two) times daily as needed. For pain      . aspirin 81 MG tablet Take 81 mg by mouth daily.        . celecoxib (CELEBREX) 200 MG capsule Take 200 mg by mouth 2 (two) times daily as needed. For pain      . chlorthalidone (HYGROTON) 25 MG tablet Take 25 mg by mouth daily.        [name] diltiazem (CARDIZEM CD) 180 MG 24 hr capsule Take 180 mg by mouth daily.        [name] gabapentin (NEURONTIN) 300 MG capsule Take 300 mg by mouth at bedtime.        . irbesartan (AVAPRO) 300 MG tablet Take 150 mg by mouth daily.       [name] lidocaine (LIDODERM) 5 % Place 1 patch onto the skin daily. Remove & Discard patch within 12 hours or as directed by MD      . metFORMIN  (GLUCOPHAGE) 500 MG tablet Take 500 mg by mouth 2 (two) times daily with a meal.        . nitroGLYCERIN (NITROSTAT) 0.4 MG SL tablet Place 0.4 mg under the tongue every 5 (five) minutes as needed. For chest pain      . pantoprazole (PROTONIX) 40 MG tablet Take 40 mg by mouth daily.      . promethazine (PHENERGAN) 25 MG tablet Take 1 tablet (25 mg total) by mouth every 6 (six) hours as needed for nausea.  30 tablet  1  . rosuvastatin (CRESTOR) 10 MG tablet Take 5 mg by mouth every other day.        No current facility-administered medications on file prior to visit.    No Known Allergies  Past Medical History  Diagnosis Date  . Chest pain   . Hyperlipidemia   . Hypertension   . Diabetes mellitus   . Asthma   . Gout   . Sleep apnea     DONE 7-8 YRS AGO [place] [facility] (DOES NOT WEAR CPAP  . GERD (gastroesophageal reflux  disease)   . Arthritis     OA    Past Surgical History  Procedure Laterality Date  . Cardiovascular stress test  06/10/2008    EF 68%  . Carpal tunnel release      BIL   . Foot surgery      LT  [facility]   . Tonsillectomy    . Eye surgery      LEFT EYE 25 YRS AGO   . Total shoulder arthroplasty  03/01/2012    Procedure: TOTAL SHOULDER ARTHROPLASTY;  Surgeon: Marin Shutter, MD;  Location: [facility];  Service: Orthopedics;  Laterality: Right;    History  Smoking status  . Never Smoker   Smokeless tobacco  . Not on file    History  Alcohol Use No    Family History  Problem Relation Age of Onset  . Diabetes type II Mother   . Heart attack Father   . Coronary artery disease Cousin     Reviw of Systems:  Reviewed in the HPI.  All other systems are negative.  Physical Exam: Blood pressure 144/90, pulse 67, height 5\' 9"  (1.753 m), weight 210 lb (95.255 kg). General: Well developed, well nourished, in no acute distress.  Head: Normocephalic, atraumatic, sclera non-icteric, mucus membranes are moist,   Neck: Supple. Carotids are 2 + without bruits.  No JVD  Lungs: Clear bilaterally to auscultation.  Heart: regular rate  With normal  S1 S2. No murmurs, gallops or rubs.  Abdomen: Soft, non-tender, non-distended with normal bowel sounds. No hepatomegaly. No rebound/guarding. No masses.  Msk:  Strength and tone are normal  Extremities: No clubbing or cyanosis. No edema.  Distal pedal pulses are 2+ and equal bilaterally.  Neuro: Alert and oriented X 3. Moves all extremities spontaneously.  Psych:  Responds to questions appropriately with a normal affect.  ECG:  June 03, 2013:  NR at 23.  Small Q waves in inferior.   Assessment / Plan:

## 2013-06-05 DIAGNOSIS — M19049 Primary osteoarthritis, unspecified hand: Secondary | ICD-10-CM | POA: Diagnosis not present

## 2013-06-05 DIAGNOSIS — M25649 Stiffness of unspecified hand, not elsewhere classified: Secondary | ICD-10-CM | POA: Diagnosis not present

## 2013-06-05 DIAGNOSIS — Z4789 Encounter for other orthopedic aftercare: Secondary | ICD-10-CM | POA: Diagnosis not present

## 2013-06-12 ENCOUNTER — Telehealth (HOSPITAL_COMMUNITY): Payer: Self-pay | Admitting: Radiology

## 2013-06-12 NOTE — Telephone Encounter (Signed)
It sounds like he did not tolerate the Imdur.

## 2013-06-12 NOTE — Telephone Encounter (Signed)
Imdur removed from patient's medication list.

## 2013-06-12 NOTE — Telephone Encounter (Signed)
Patient states he has taken Isosorbide x 3 and he can no longer tolerate.  This information was given to [facility] in Nuclear Imaging.  Message forwarded to Dr. Acie Fredrickson for his knowledge.

## 2013-06-14 DIAGNOSIS — I2510 Atherosclerotic heart disease of native coronary artery without angina pectoris: Secondary | ICD-10-CM

## 2013-06-14 HISTORY — DX: Atherosclerotic heart disease of native coronary artery without angina pectoris: I25.10

## 2013-06-20 ENCOUNTER — Other Ambulatory Visit: Payer: Self-pay | Admitting: Cardiovascular Disease

## 2013-06-20 ENCOUNTER — Ambulatory Visit (INDEPENDENT_AMBULATORY_CARE_PROVIDER_SITE_OTHER): Payer: Medicare Other | Admitting: Cardiovascular Disease

## 2013-06-20 ENCOUNTER — Encounter: Payer: Self-pay | Admitting: *Deleted

## 2013-06-20 ENCOUNTER — Ambulatory Visit (HOSPITAL_COMMUNITY): Payer: Medicare Other | Admitting: Radiology

## 2013-06-20 ENCOUNTER — Encounter: Payer: Self-pay | Admitting: Cardiovascular Disease

## 2013-06-20 ENCOUNTER — Encounter (HOSPITAL_COMMUNITY): Payer: Self-pay | Admitting: Pharmacy Technician

## 2013-06-20 VITALS — BP 160/84 | HR 82 | Ht 69.0 in | Wt 207.0 lb

## 2013-06-20 VITALS — BP 172/103 | HR 62 | Ht 69.0 in | Wt 207.0 lb

## 2013-06-20 DIAGNOSIS — K219 Gastro-esophageal reflux disease without esophagitis: Secondary | ICD-10-CM | POA: Diagnosis not present

## 2013-06-20 DIAGNOSIS — Z79899 Other long term (current) drug therapy: Secondary | ICD-10-CM

## 2013-06-20 DIAGNOSIS — Z01810 Encounter for preprocedural cardiovascular examination: Secondary | ICD-10-CM

## 2013-06-20 DIAGNOSIS — E785 Hyperlipidemia, unspecified: Secondary | ICD-10-CM | POA: Diagnosis not present

## 2013-06-20 DIAGNOSIS — E119 Type 2 diabetes mellitus without complications: Secondary | ICD-10-CM

## 2013-06-20 DIAGNOSIS — R0602 Shortness of breath: Secondary | ICD-10-CM | POA: Diagnosis not present

## 2013-06-20 DIAGNOSIS — J45909 Unspecified asthma, uncomplicated: Secondary | ICD-10-CM | POA: Diagnosis not present

## 2013-06-20 DIAGNOSIS — R0789 Other chest pain: Secondary | ICD-10-CM

## 2013-06-20 DIAGNOSIS — G4733 Obstructive sleep apnea (adult) (pediatric): Secondary | ICD-10-CM | POA: Diagnosis not present

## 2013-06-20 DIAGNOSIS — R079 Chest pain, unspecified: Secondary | ICD-10-CM

## 2013-06-20 DIAGNOSIS — I2510 Atherosclerotic heart disease of native coronary artery without angina pectoris: Secondary | ICD-10-CM | POA: Diagnosis not present

## 2013-06-20 DIAGNOSIS — I10 Essential (primary) hypertension: Secondary | ICD-10-CM | POA: Diagnosis not present

## 2013-06-20 DIAGNOSIS — M109 Gout, unspecified: Secondary | ICD-10-CM | POA: Diagnosis not present

## 2013-06-20 DIAGNOSIS — I200 Unstable angina: Secondary | ICD-10-CM | POA: Diagnosis not present

## 2013-06-20 LAB — CBC WITH DIFFERENTIAL/PLATELET
BASOS PCT: 0.3 % (ref 0.0–3.0)
Basophils Absolute: 0 10*3/uL (ref 0.0–0.1)
EOS ABS: 0 10*3/uL (ref 0.0–0.7)
Eosinophils Relative: 0.6 % (ref 0.0–5.0)
HCT: 43.8 % (ref 39.0–52.0)
Hemoglobin: 15 g/dL (ref 13.0–17.0)
Lymphocytes Relative: 17.9 % (ref 12.0–46.0)
Lymphs Abs: 1.2 10*3/uL (ref 0.7–4.0)
MCHC: 34.2 g/dL (ref 30.0–36.0)
MCV: 88.8 fl (ref 78.0–100.0)
MONO ABS: 0.5 10*3/uL (ref 0.1–1.0)
Monocytes Relative: 7.2 % (ref 3.0–12.0)
NEUTROS PCT: 74 % (ref 43.0–77.0)
Neutro Abs: 5 10*3/uL (ref 1.4–7.7)
PLATELETS: 173 10*3/uL (ref 150.0–400.0)
RBC: 4.93 Mil/uL (ref 4.22–5.81)
RDW: 13.4 % (ref 11.5–15.5)
WBC: 6.7 10*3/uL (ref 4.0–10.5)

## 2013-06-20 LAB — PROTIME-INR
INR: 1.1 ratio — AB (ref 0.8–1.0)
Prothrombin Time: 11.9 s (ref 9.6–13.1)

## 2013-06-20 LAB — BASIC METABOLIC PANEL
BUN: 14 mg/dL (ref 6–23)
CO2: 27 mEq/L (ref 19–32)
Calcium: 9.4 mg/dL (ref 8.4–10.5)
Chloride: 101 mEq/L (ref 96–112)
Creatinine, Ser: 0.8 mg/dL (ref 0.4–1.5)
GFR: 101.14 mL/min (ref >60.00)
Glucose, Bld: 197 mg/dL — ABNORMAL HIGH (ref 70–99)
POTASSIUM: 3.7 meq/L (ref 3.5–5.1)
Sodium: 137 mEq/L (ref 135–145)

## 2013-06-20 MED ORDER — ATENOLOL 50 MG PO TABS: 50.0000 mg | ORAL_TABLET | Freq: Every day | ORAL | Status: DC

## 2013-06-20 MED ORDER — TECHNETIUM TC 99M SESTAMIBI GENERIC - CARDIOLITE
33.0000 | Freq: Once | INTRAVENOUS | Status: AC | PRN
  Administered 2013-06-20: 33 via INTRAVENOUS

## 2013-06-20 MED ORDER — TECHNETIUM TC 99M SESTAMIBI GENERIC - CARDIOLITE
11.0000 | Freq: Once | INTRAVENOUS | Status: AC | PRN
  Administered 2013-06-20: 11 via INTRAVENOUS

## 2013-06-20 NOTE — Assessment & Plan Note (Signed)
Anginal sounding Abnormal myovue suggesting LAD disease  Adding beta blocker Could not tolerate nitrates  Cath scheduled for tomorrow with CM

## 2013-06-20 NOTE — Assessment & Plan Note (Signed)
Hold glucophage before cath  Will check BS post intervention

## 2013-06-20 NOTE — Assessment & Plan Note (Signed)
D/C cardizem  starte atenolol 50 mg daily in light of likely CAD

## 2013-06-20 NOTE — Progress Notes (Signed)
[facility] [address] [place], [place] [address] [phone]    Cardiology Nuclear Med Study  Hunter Mcbride is a 68 y.o. male     MRN : [id number]     DOB: 1945/10/06  Procedure Date: 06/20/2013  Nuclear Med Background Indication for Stress Test:  Evaluation for Ischemia and Abnormal EKG History:  Cath ~20 yrs ago, MPI 2012 (normal) EF 56%, Asthma Cardiac Risk Factors: Family History - CAD, Hypertension, Lipids and NIDDM  Symptoms:  Chest Pain (last date of chest discomfort was Monday)   Nuclear Pre-Procedure Caffeine/Decaff Intake:  None NPO After: 7:00pm   Lungs:  clear O2 Sat: 96% on room air. IV 0.9% NS with Angio Cath:  22g  IV Site: R Hand  IV Started by:  Crissie Figures, RN  Chest Size (in):  48 Cup Size: n/a  Height: 5\' 9"  (1.753 m)  Weight:  207 lb (93.895 kg)  BMI:  Body mass index is 30.55 kg/(m^2). Tech Comments:  N/A    Nuclear Med Study 1 or 2 day study: 1 day  Stress Test Type:  Stress  Reading MD: N/A  Order Authorizing Provider:  Mertie Moores, MD  Resting Radionuclide: Technetium 22m Sestamibi  Resting Radionuclide Dose: 11.0 mCi   Stress Radionuclide:  Technetium 29m Sestamibi  Stress Radionuclide Dose: 33.0 mCi           Stress Protocol Rest HR: 62 Stress HR: 136  Rest BP: 172/103 Stress BP: 231/108  Exercise Time (min): 8:30 METS: 10.1           Dose of Adenosine (mg):  n/a Dose of Lexiscan: n/a mg  Dose of Atropine (mg): n/a Dose of Dobutamine: n/a mcg/kg/min (at max HR)  Stress Test Technologist: Glade Lloyd, BS-ES  Nuclear Technologist:  Charlton Amor, CNMT     Rest Procedure:  Myocardial perfusion imaging was performed at rest 45 minutes following the intravenous administration of Technetium 33m Sestamibi. Rest ECG: NSR with non-specific ST-T wave changes  Stress Procedure:  The patient exercised on the treadmill utilizing the Bruce Protocol for 8:30 minutes. The patient stopped due to bilateral arm  tingling, pain in between shoulder blades with a 5/10 discomfort.  Technetium 65m Sestamibi was injected at peak exercise and myocardial perfusion imaging was performed after a brief delay.  Stress ECG: Significant ST abnormalities consistent with ischemia. 2 mm horizontal ST depression II, III, aVF, V6  QPS Raw Data Images:  Normal; no motion artifact; normal heart/lung ratio. Stress Images:  there is moderately reduced apical tracer uptake. There is mildly reduced inferior wall uptake Rest Images:  Normal homogeneous uptake in all areas of the myocardium. Subtraction (SDS):  These findings are consistent with ischemia. Transient Ischemic Dilatation (Normal <1.22): 1.19 Lung/Heart Ratio (Normal <0.45):  0.31  Quantitative Gated Spect Images QGS EDV:  116 ml QGS ESV:  48 ml  Impression Exercise Capacity:  Good exercise capacity. BP Response:  Hypertensive blood pressure response. Clinical Symptoms:  Typical chest pain. ECG Impression:  Significant ST abnormalities consistent with ischemia. Comparison with Prior Nuclear Study: Compared to 2012, the perfusion abnormalities are new  Overall Impression:  High risk stress nuclear study suggestive of multivessel CAD.  LV Ejection Fraction: 59%.  LV Wall Motion:  NL LV Function; NL Wall Motion

## 2013-06-20 NOTE — Assessment & Plan Note (Signed)
Cholesterol is at goal.  Continue current dose of statin and diet Rx.  No myalgias or side effects.  F/U  LFT's in 6 months. Lab Results  Component Value Date   LDLCALC 58 04/11/2011

## 2013-06-20 NOTE — Progress Notes (Signed)
Patient ID: Hunter Mcbride, male   DOB: [birth year], 68 y.o.   MRN: [id number] 68 yo patient of Dr Acie Fredrickson  Added on for positive myovue.  Having fairly classic angina last couple of months.  Has had distant cath that was normal.  Seen by Nahser recently and myovue ordered.  Started on imdur but stopped and could not tolerate due to headaches.  Increasing angina at lower work loads.  No rest pain.  SSCP radiates to arms and back Relieved with rest  Myovue shows significant anteroapical ischemia EF 59%  Discussed need for cath with patient.  Willing to proceed tomorrow Risks including stroke , MI and need for emergency surgery discussed willing to proceed.  Lab called and schedule Orders written Lab work today  Has had right shoulder replacement but right radial approach should be ok     ROS: Denies fever, malais, weight loss, blurry vision, decreased visual acuity, cough, sputum, SOB, hemoptysis, pleuritic pain, palpitaitons, heartburn, abdominal pain, melena, lower extremity edema, claudication, or rash.  All other systems reviewed and negative  General: Affect appropriate Healthy:  appears stated age 12: normal Neck supple with no adenopathy JVP normal no bruits no thyromegaly Lungs clear with no wheezing and good diaphragmatic motion Heart:  S1/S2 no murmur, no rub, gallop or click PMI normal Abdomen: benighn, BS positve, no tenderness, no AAA no bruit.  No HSM or HJR Distal pulses intact with no bruits No edema Neuro non-focal Skin warm and dry No muscular weakness   Current Outpatient Prescriptions  Medication Sig Dispense Refill  . acetaminophen (TYLENOL ARTHRITIS PAIN) 650 MG CR tablet Take 1,300 mg by mouth 2 (two) times daily as needed. For pain      . aspirin 81 MG tablet Take 81 mg by mouth daily.        . celecoxib (CELEBREX) 200 MG capsule Take 200 mg by mouth 2 (two) times daily as needed. For pain      . chlorthalidone (HYGROTON) 25 MG tablet Take 25 mg by mouth daily.         [name] diltiazem (CARDIZEM CD) 180 MG 24 hr capsule Take 180 mg by mouth daily.        [name] gabapentin (NEURONTIN) 300 MG capsule Take 300 mg by mouth at bedtime.        . hydrochlorothiazide (HYDRODIURIL) 25 MG tablet       . irbesartan (AVAPRO) 300 MG tablet Take 150 mg by mouth daily.       [name] lidocaine (LIDODERM) 5 % Place 1 patch onto the skin daily. Remove & Discard patch within 12 hours or as directed by MD      . LORazepam (ATIVAN) 0.5 MG tablet       . metFORMIN (GLUCOPHAGE) 500 MG tablet Take 500 mg by mouth 2 (two) times daily with a meal.        . nitroGLYCERIN (NITROSTAT) 0.4 MG SL tablet Place 1 tablet (0.4 mg total) under the tongue every 5 (five) minutes as needed. For chest pain  25 tablet  5  . ONGLYZA 5 MG TABS tablet       . pantoprazole (PROTONIX) 40 MG tablet Take 40 mg by mouth daily.      . promethazine (PHENERGAN) 25 MG tablet Take 1 tablet (25 mg total) by mouth every 6 (six) hours as needed for nausea.  30 tablet  1  . rosuvastatin (CRESTOR) 10 MG tablet Take 5 mg by mouth every other day.       [name]  traMADol (ULTRAM) 50 MG tablet        No current facility-administered medications for this visit.    Allergies  Review of patient's allergies indicates no known allergies.  Electrocardiogram:  NSR insignificant Q waves in 2,3 and F   Assessment and Plan

## 2013-06-20 NOTE — Patient Instructions (Addendum)
Your physician has requested that you have a cardiac catheterization. Cardiac catheterization is used to diagnose and/or treat various heart conditions. Doctors may recommend this procedure for a number of different reasons. The most common reason is to evaluate chest pain. Chest pain can be a symptom of coronary artery disease (CAD), and cardiac catheterization can show whether plaque is narrowing or blocking your heart's arteries. This procedure is also used to evaluate the valves, as well as measure the blood flow and oxygen levels in different parts of your heart. For further information please visit [email or web address]. Please follow instruction sheet, as given. Your physician has recommended you make the following change in your medication: STOP  DILTIAZEM  START ATENOLOL  50 MG  Your physician recommends that you return for lab work in: [place]

## 2013-06-21 ENCOUNTER — Encounter (HOSPITAL_COMMUNITY)
Admission: RE | Disposition: A | Discharge status: Home / Self Care | Payer: Medicare Other | Source: Ambulatory Visit | Attending: Cardiovascular Disease

## 2013-06-21 ENCOUNTER — Other Ambulatory Visit: Payer: Self-pay

## 2013-06-21 ENCOUNTER — Ambulatory Visit: Payer: Medicare Other | Admitting: Cardiovascular Disease

## 2013-06-21 ENCOUNTER — Ambulatory Visit (HOSPITAL_COMMUNITY)
Admission: RE | Admit: 2013-06-21 | Discharge: 2013-06-22 | Disposition: A | Discharge status: Home / Self Care | Payer: Medicare Other | Source: Ambulatory Visit | Attending: Cardiovascular Disease | Admitting: Cardiovascular Disease

## 2013-06-21 ENCOUNTER — Encounter (HOSPITAL_COMMUNITY): Payer: Self-pay | Admitting: General Practice

## 2013-06-21 DIAGNOSIS — R079 Chest pain, unspecified: Secondary | ICD-10-CM

## 2013-06-21 DIAGNOSIS — I2510 Atherosclerotic heart disease of native coronary artery without angina pectoris: Secondary | ICD-10-CM

## 2013-06-21 DIAGNOSIS — E119 Type 2 diabetes mellitus without complications: Secondary | ICD-10-CM

## 2013-06-21 DIAGNOSIS — G4733 Obstructive sleep apnea (adult) (pediatric): Secondary | ICD-10-CM | POA: Diagnosis not present

## 2013-06-21 DIAGNOSIS — Z955 Presence of coronary angioplasty implant and graft: Secondary | ICD-10-CM

## 2013-06-21 DIAGNOSIS — I200 Unstable angina: Secondary | ICD-10-CM | POA: Diagnosis present

## 2013-06-21 DIAGNOSIS — R0789 Other chest pain: Secondary | ICD-10-CM

## 2013-06-21 DIAGNOSIS — E785 Hyperlipidemia, unspecified: Secondary | ICD-10-CM

## 2013-06-21 DIAGNOSIS — K219 Gastro-esophageal reflux disease without esophagitis: Secondary | ICD-10-CM | POA: Insufficient documentation

## 2013-06-21 DIAGNOSIS — I10 Essential (primary) hypertension: Secondary | ICD-10-CM

## 2013-06-21 DIAGNOSIS — M19019 Primary osteoarthritis, unspecified shoulder: Secondary | ICD-10-CM

## 2013-06-21 DIAGNOSIS — M109 Gout, unspecified: Secondary | ICD-10-CM | POA: Insufficient documentation

## 2013-06-21 DIAGNOSIS — J45909 Unspecified asthma, uncomplicated: Secondary | ICD-10-CM | POA: Insufficient documentation

## 2013-06-21 HISTORY — PX: LEFT HEART CATHETERIZATION WITH CORONARY ANGIOGRAM: SHX5451

## 2013-06-21 HISTORY — DX: Atherosclerotic heart disease of native coronary artery without angina pectoris: I25.10

## 2013-06-21 HISTORY — DX: Acute myocardial infarction, unspecified: I21.9

## 2013-06-21 HISTORY — PX: CORONARY ANGIOPLASTY WITH STENT PLACEMENT: SHX49

## 2013-06-21 HISTORY — DX: Type 2 diabetes mellitus without complications: E11.9

## 2013-06-21 HISTORY — DX: Headache: R51

## 2013-06-21 HISTORY — DX: Unspecified osteoarthritis, unspecified site: M19.90

## 2013-06-21 LAB — GLUCOSE, CAPILLARY
GLUCOSE-CAPILLARY: 157 mg/dL — AB (ref 70–99)
GLUCOSE-CAPILLARY: 227 mg/dL — AB (ref 70–99)
GLUCOSE-CAPILLARY: 185 mg/dL — AB (ref 70–99)
Glucose-Capillary: 141 mg/dL — ABNORMAL HIGH (ref 70–99)

## 2013-06-21 LAB — POCT ACTIVATED CLOTTING TIME
ACTIVATED CLOTTING TIME: 293 s
Activated Clotting Time: 271 seconds
Activated Clotting Time: 265 seconds
Activated Clotting Time: 653 seconds

## 2013-06-21 SURGERY — LEFT HEART CATHETERIZATION WITH CORONARY ANGIOGRAM
Anesthesia: LOCAL

## 2013-06-21 MED ORDER — ASPIRIN 81 MG PO CHEW
81.0000 mg | CHEWABLE_TABLET | ORAL | Status: AC
  Administered 2013-06-21: 81 mg via ORAL
  Filled 2013-06-21: qty 1

## 2013-06-21 MED ORDER — SODIUM CHLORIDE 0.9 % IV SOLN
INTRAVENOUS | Status: DC
  Administered 2013-06-21: 09:00:00 via INTRAVENOUS

## 2013-06-21 MED ORDER — ASPIRIN 81 MG PO CHEW
81.0000 mg | CHEWABLE_TABLET | Freq: Every day | ORAL | Status: DC
  Filled 2013-06-21: qty 1

## 2013-06-21 MED ORDER — SODIUM CHLORIDE 0.9 % IJ SOLN: 3.0000 mL | INTRAMUSCULAR | Status: DC | PRN

## 2013-06-21 MED ORDER — CLOPIDOGREL BISULFATE 75 MG PO TABS
75.0000 mg | ORAL_TABLET | Freq: Every day | ORAL | Status: DC
  Administered 2013-06-22: 10:00:00 75 mg via ORAL

## 2013-06-21 MED ORDER — LIDOCAINE HCL (PF) 1 % IJ SOLN
INTRAMUSCULAR | Status: AC
  Filled 2013-06-21: qty 30

## 2013-06-21 MED ORDER — VERAPAMIL HCL 2.5 MG/ML IV SOLN
INTRAVENOUS | Status: AC
  Filled 2013-06-21: qty 2

## 2013-06-21 MED ORDER — PANTOPRAZOLE SODIUM 40 MG PO TBEC: 40.0000 mg | DELAYED_RELEASE_TABLET | Freq: Every day | ORAL | Status: DC

## 2013-06-21 MED ORDER — HEPARIN (PORCINE) IN NACL 2-0.9 UNIT/ML-% IJ SOLN
INTRAMUSCULAR | Status: AC
  Filled 2013-06-21: qty 1000

## 2013-06-21 MED ORDER — FENTANYL CITRATE 0.05 MG/ML IJ SOLN
INTRAMUSCULAR | Status: AC
  Filled 2013-06-21: qty 2

## 2013-06-21 MED ORDER — SODIUM CHLORIDE 0.9 % IJ SOLN: 3.0000 mL | Freq: Two times a day (BID) | INTRAMUSCULAR | Status: DC

## 2013-06-21 MED ORDER — TRAMADOL HCL 50 MG PO TABS: 50.0000 mg | ORAL_TABLET | Freq: Four times a day (QID) | ORAL | Status: DC | PRN

## 2013-06-21 MED ORDER — MIDAZOLAM HCL 2 MG/2ML IJ SOLN
INTRAMUSCULAR | Status: AC
Start: 2013-06-21 — End: 2013-06-21
  Filled 2013-06-21: qty 2

## 2013-06-21 MED ORDER — MIDAZOLAM HCL 2 MG/2ML IJ SOLN
INTRAMUSCULAR | Status: AC
  Filled 2013-06-21: qty 2

## 2013-06-21 MED ORDER — LORAZEPAM 0.5 MG PO TABS: 0.5000 mg | ORAL_TABLET | Freq: Two times a day (BID) | ORAL | Status: DC | PRN

## 2013-06-21 MED ORDER — CLOPIDOGREL BISULFATE 300 MG PO TABS
ORAL_TABLET | ORAL | Status: AC
  Filled 2013-06-21: qty 2

## 2013-06-21 MED ORDER — GABAPENTIN 300 MG PO CAPS
300.0000 mg | ORAL_CAPSULE | Freq: Every day | ORAL | Status: DC
  Administered 2013-06-21: 22:00:00 300 mg via ORAL
  Filled 2013-06-21 (×2): qty 1

## 2013-06-21 MED ORDER — ATORVASTATIN CALCIUM 40 MG PO TABS
40.0000 mg | ORAL_TABLET | Freq: Every day | ORAL | Status: DC
  Filled 2013-06-21: qty 1

## 2013-06-21 MED ORDER — IRBESARTAN 150 MG PO TABS
150.0000 mg | ORAL_TABLET | Freq: Every day | ORAL | Status: DC
  Filled 2013-06-21 (×3): qty 1

## 2013-06-21 MED ORDER — LINAGLIPTIN 5 MG PO TABS
5.0000 mg | ORAL_TABLET | Freq: Every day | ORAL | Status: DC
  Filled 2013-06-21: qty 1

## 2013-06-21 MED ORDER — ADULT MULTIVITAMIN W/MINERALS CH
1.0000 | ORAL_TABLET | Freq: Every day | ORAL | Status: DC
  Administered 2013-06-21: 17:00:00 1 via ORAL
  Filled 2013-06-21 (×3): qty 1

## 2013-06-21 MED ORDER — ATENOLOL 50 MG PO TABS
50.0000 mg | ORAL_TABLET | Freq: Every day | ORAL | Status: DC
  Filled 2013-06-21: qty 1

## 2013-06-21 MED ORDER — NITROGLYCERIN 0.2 MG/ML ON CALL CATH LAB
INTRAVENOUS | Status: AC
  Filled 2013-06-21: qty 1

## 2013-06-21 MED ORDER — HEPARIN SODIUM (PORCINE) 1000 UNIT/ML IJ SOLN
INTRAMUSCULAR | Status: AC
  Filled 2013-06-21: qty 1

## 2013-06-21 MED ORDER — HYDROCHLOROTHIAZIDE 25 MG PO TABS
25.0000 mg | ORAL_TABLET | Freq: Every day | ORAL | Status: DC
  Administered 2013-06-21: 25 mg via ORAL
  Filled 2013-06-21 (×3): qty 1

## 2013-06-21 MED ORDER — SODIUM CHLORIDE 0.9 % IV SOLN: INTRAVENOUS | Status: AC

## 2013-06-21 MED ORDER — SODIUM CHLORIDE 0.9 % IV SOLN: 250.0000 mL | INTRAVENOUS | Status: DC | PRN

## 2013-06-21 NOTE — CV Procedure (Signed)
Cardiac Catheterization Operative Report  Hunter Mcbride [id number] 5/8/201512:44 PM Jerlyn Ly, MD  Procedure Performed:  1. Left Heart Catheterization 2. Selective Coronary Angiography 3. Left ventricular angiogram 4. PTCA/DES x 3 mid LAD  Operator: Lauree Chandler, MD  Arterial access site:  Right radial artery.   Indication:  68 yo male with history of DM, HTN, HLD with recent unstable angina (class III), abnormal stress myoview with anterior wall ischemia.                                      Procedure Details: The risks, benefits, complications, treatment options, and expected outcomes were discussed with the patient. The patient and/or family concurred with the proposed plan, giving informed consent. The patient was brought to the cath lab after IV hydration was begun and oral premedication was given. The patient was further sedated with Versed and Fentanyl. The right wrist was assessed with an Allens test which was positive. The right wrist was prepped and draped in a sterile fashion. 1% lidocaine was used for local anesthesia. Using the modified Seldinger access technique, a 5/6 French sheath was placed in the right radial artery. 3 mg Verapamil was given through the sheath. 5000 units IV heparin was given. Standard diagnostic catheters were used to perform selective coronary angiography. A pigtail catheter was used to perform a left ventricular angiogram. He was found to have severe stenosis in the calcified, diffusely diseased mid LAD. I elected to proceed to PCI of the LAD.   PCI Note: The left main was engaged with a XB LAD 3.5 guiding catheter. He was given Plavix 600 mg po x 1. He was given additional IV heparin and ACT was over 250. Total Heparin IV during case was 14,000 units. I then passed a Cougar IC wire down the LAD. I could not pass a balloon beyond the mid vessel so I used another Cougar IC wire as a buddy wire for support. I was able to pass a 1.5 x 15 mm  balloon into the distal vessel and performed several balloon inflations. I then passed a 2.0 x 20 mm balloon into the mid vessel and performed one inflation. I could not pass a stent down the vessel and I had poor guide support. I changed out for an EBU 3.5 guide and passed another Cougar IC wire and a Mailman wire down the LAD. I then used a 2.0 x 12 mm balloon to pre-dilate the entire mid vessel. I was then able to deliver stents. I placed a 2.25 x 18 mm Resolute Integrity DES, followed by an overlapping 2.25 x 18 mm Resolute Integrity DES in the mid vessel and then a 2.25 x 22 mm Resolute Integrity DES in the most proximal segment of the mid vessel. The stents were post-dilated with a 2.25 x 20 mm Mesilla balloon x 3. The stenosis was taken from 99% down to 0%.   The sheath was removed from the right radial artery and a Terumo hemostasis band was applied at the arteriotomy site on the right wrist. There were no immediate complications. The patient was taken to the recovery area in stable condition.   Hemodynamic Findings: Central aortic pressure: 142/72 Left ventricular pressure: 143/10/19  Angiographic Findings:  Left main: Ostial 30-40% stenosis.   Left Anterior Descending Artery: Moderate caliber vessel that courses to the apex. There is an ostial 40% calcified stenosis. The  proximal vessel has diffuse 20% stenosis. The mid vessel is moderately calcified with diffuse 99% stenosis. The distal vessel has diffuse 50% stenosis. The entire vessel is diffusely diseased. The first diagonal branch is small in caliber with proximal 50% stenosis.   Circumflex Artery: Moderate caliber vessel with 20% proximal stenosis, 60% ostium of continuation of the AV groove Circumflex beyond the takeoff of the first OM branch. The first OM branch has a focal 50% stenosis.   Right Coronary Artery: Large caliber dominant calcified vessel with proximal 20% stenosis, mid 40% stenosis, distal 30% stenosis. The ostium of the  posterolateral branch has 40% stenosis. The ostium of the PDA has 50% stenosis.   Left Ventricular Angiogram: LVEF=55-60%  Impression: 1. Unstable angina 2. Severe stenosis in the diffusely diseased mid LAD, now s/p successful PTCA/DES x 3 mid LAD 3. Moderate non-obstructive disease in the Circumflex and RCA 4. Normal LV systolic function  Recommendations: He will need dual anti-platelet therapy with ASA and Plavix for at least one year. Given his diabetes and diffuse CAD, would recommend dual antiplatelet therapy for lifetime. Home in am if stable.        Complications:  None. The patient tolerated the procedure well.

## 2013-06-21 NOTE — H&P (View-Only) (Signed)
Patient ID: Hunter Mcbride, male   DOB: Dec 03, 1945, 68 y.o.   MRN: [id number] 68 yo patient of Dr Acie Fredrickson  Added on for positive myovue.  Having fairly classic angina last couple of months.  Has had distant cath that was normal.  Seen by Nahser recently and myovue ordered.  Started on imdur but stopped and could not tolerate due to headaches.  Increasing angina at lower work loads.  No rest pain.  SSCP radiates to arms and back Relieved with rest  Myovue shows significant anteroapical ischemia EF 59%  Discussed need for cath with patient.  Willing to proceed tomorrow Risks including stroke , MI and need for emergency surgery discussed willing to proceed.  Lab called and schedule Orders written Lab work today  Has had right shoulder replacement but right radial approach should be ok     ROS: Denies fever, malais, weight loss, blurry vision, decreased visual acuity, cough, sputum, SOB, hemoptysis, pleuritic pain, palpitaitons, heartburn, abdominal pain, melena, lower extremity edema, claudication, or rash.  All other systems reviewed and negative  General: Affect appropriate Healthy:  appears stated age 68: normal Neck supple with no adenopathy JVP normal no bruits no thyromegaly Lungs clear with no wheezing and good diaphragmatic motion Heart:  S1/S2 no murmur, no rub, gallop or click PMI normal Abdomen: benighn, BS positve, no tenderness, no AAA no bruit.  No HSM or HJR Distal pulses intact with no bruits No edema Neuro non-focal Skin warm and dry No muscular weakness   Current Outpatient Prescriptions  Medication Sig Dispense Refill  . acetaminophen (TYLENOL ARTHRITIS PAIN) 650 MG CR tablet Take 1,300 mg by mouth 2 (two) times daily as needed. For pain      . aspirin 81 MG tablet Take 81 mg by mouth daily.        . celecoxib (CELEBREX) 200 MG capsule Take 200 mg by mouth 2 (two) times daily as needed. For pain      . chlorthalidone (HYGROTON) 25 MG tablet Take 25 mg by mouth daily.         [name] diltiazem (CARDIZEM CD) 180 MG 24 hr capsule Take 180 mg by mouth daily.        [name] gabapentin (NEURONTIN) 300 MG capsule Take 300 mg by mouth at bedtime.        . hydrochlorothiazide (HYDRODIURIL) 25 MG tablet       . irbesartan (AVAPRO) 300 MG tablet Take 150 mg by mouth daily.       [name] lidocaine (LIDODERM) 5 % Place 1 patch onto the skin daily. Remove & Discard patch within 12 hours or as directed by MD      . LORazepam (ATIVAN) 0.5 MG tablet       . metFORMIN (GLUCOPHAGE) 500 MG tablet Take 500 mg by mouth 2 (two) times daily with a meal.        . nitroGLYCERIN (NITROSTAT) 0.4 MG SL tablet Place 1 tablet (0.4 mg total) under the tongue every 5 (five) minutes as needed. For chest pain  25 tablet  5  . ONGLYZA 5 MG TABS tablet       . pantoprazole (PROTONIX) 40 MG tablet Take 40 mg by mouth daily.      . promethazine (PHENERGAN) 25 MG tablet Take 1 tablet (25 mg total) by mouth every 6 (six) hours as needed for nausea.  30 tablet  1  . rosuvastatin (CRESTOR) 10 MG tablet Take 5 mg by mouth every other day.       [name]  traMADol (ULTRAM) 50 MG tablet        No current facility-administered medications for this visit.    Allergies  Review of patient's allergies indicates no known allergies.  Electrocardiogram:  NSR insignificant Q waves in 2,3 and F   Assessment and Plan

## 2013-06-21 NOTE — Interval H&P Note (Signed)
History and Physical Interval Note:  06/21/2013 10:33 AM  [name]  has presented today for cardiac cath with the diagnosis of Abnormal myoview, unstable angina.   The various methods of treatment have been discussed with the patient and family. After consideration of risks, benefits and other options for treatment, the patient has consented to  Procedure(s): LEFT HEART CATHETERIZATION WITH CORONARY ANGIOGRAM (N/A) as a surgical intervention .  The patient's history has been reviewed, patient examined, no change in status, stable for surgery.  I have reviewed the patient's chart and labs.  Questions were answered to the patient's satisfaction.    Cath Lab Visit (complete for each Cath Lab visit)  Clinical Evaluation Leading to the Procedure:   ACS: no  Non-ACS:    Anginal Classification: CCS III  Anti-ischemic medical therapy: Minimal Therapy (1 class of medications)  Non-Invasive Test Results: High-risk stress test findings: cardiac mortality >3%/year  Prior CABG: No previous CABG        Burnell Blanks

## 2013-06-22 ENCOUNTER — Encounter (HOSPITAL_COMMUNITY): Payer: Self-pay | Admitting: Cardiology

## 2013-06-22 DIAGNOSIS — R079 Chest pain, unspecified: Secondary | ICD-10-CM

## 2013-06-22 DIAGNOSIS — E119 Type 2 diabetes mellitus without complications: Secondary | ICD-10-CM | POA: Diagnosis not present

## 2013-06-22 DIAGNOSIS — I2510 Atherosclerotic heart disease of native coronary artery without angina pectoris: Secondary | ICD-10-CM

## 2013-06-22 DIAGNOSIS — E785 Hyperlipidemia, unspecified: Secondary | ICD-10-CM

## 2013-06-22 DIAGNOSIS — Z9861 Coronary angioplasty status: Secondary | ICD-10-CM

## 2013-06-22 DIAGNOSIS — I200 Unstable angina: Secondary | ICD-10-CM

## 2013-06-22 DIAGNOSIS — I10 Essential (primary) hypertension: Secondary | ICD-10-CM | POA: Diagnosis not present

## 2013-06-22 LAB — CBC
HCT: 40.1 % (ref 39.0–52.0)
Hemoglobin: 13.8 g/dL (ref 13.0–17.0)
MCH: 29.8 pg (ref 26.0–34.0)
MCHC: 34.4 g/dL (ref 30.0–36.0)
MCV: 86.6 fL (ref 78.0–100.0)
Platelets: 128 10*3/uL — ABNORMAL LOW (ref 150–400)
RBC: 4.63 MIL/uL (ref 4.22–5.81)
RDW: 12.8 % (ref 11.5–15.5)
WBC: 4.6 10*3/uL (ref 4.0–10.5)

## 2013-06-22 LAB — BASIC METABOLIC PANEL
BUN: 13 mg/dL (ref 6–23)
CO2: 25 meq/L (ref 19–32)
Calcium: 9.2 mg/dL (ref 8.4–10.5)
Chloride: 100 mEq/L (ref 96–112)
Creatinine, Ser: 0.78 mg/dL (ref 0.50–1.35)
GFR calc Af Amer: >90 mL/min (ref >90)
GLUCOSE: 155 mg/dL — AB (ref 70–99)
Potassium: 3.9 mEq/L (ref 3.7–5.3)
Sodium: 137 mEq/L (ref 137–147)

## 2013-06-22 MED ORDER — METFORMIN HCL ER 500 MG PO TB24: 500.0000 mg | ORAL_TABLET | Freq: Two times a day (BID) | ORAL | Status: DC

## 2013-06-22 MED ORDER — CLOPIDOGREL BISULFATE 75 MG PO TABS: 75.0000 mg | ORAL_TABLET | Freq: Every day | ORAL | Status: DC

## 2013-06-22 NOTE — Progress Notes (Signed)
Patient: Hunter Mcbride Date of Encounter: 06/22/2013, 7:20 AM Admit date: 06/21/2013     Subjective  Mr. Hunter Mcbride has no complaints this AM. He is eager to go home.   Objective  Physical Exam: Vitals: BP 161/77  Pulse 65  Temp(Src) 98.5 F (36.9 C) (Oral)  Resp 18  Ht 5\' 9"  (1.753 m)  Wt 203 lb 14.8 oz (92.5 kg)  BMI 30.10 kg/m2  SpO2 96% General: Well developed, well appearing 68 year old male in no acute distress. Neck: Supple. JVD not elevated. Lungs: Clear bilaterally to auscultation without wheezes, rales, or rhonchi. Breathing is unlabored. Heart: RRR S1 S2 without murmurs, rubs, or gallops.  Abdomen: Soft, non-distended. Extremities: No clubbing or cyanosis. No edema.  Distal pedal pulses are 2+ and equal bilaterally. Right radial site intact without bleeding or hematoma. Neuro: Alert and oriented X 3. Moves all extremities spontaneously. No focal deficits.  Intake/Output:  Intake/Output Summary (Last 24 hours) at 06/22/13 0720 Last data filed at 06/21/13 1919  Gross per 24 hour  Intake    700 ml  Output    400 ml  Net    300 ml    Inpatient Medications:  . aspirin  81 mg Oral Daily  . atenolol  50 mg Oral Daily  . atorvastatin  40 mg Oral q1800  . clopidogrel  75 mg Oral Q breakfast  . gabapentin  300 mg Oral QHS  . hydrochlorothiazide  25 mg Oral Daily  . irbesartan  150 mg Oral Daily  . linagliptin  5 mg Oral Daily  . multivitamin with minerals  1 tablet Oral Daily  . pantoprazole  40 mg Oral Daily    Labs:  Recent Labs  06/20/13 1132 06/22/13 0515  NA 137 137  K 3.7 3.9  CL 101 100  CO2 27 25  GLUCOSE 197* 155*  BUN 14 13  CREATININE 0.8 0.78  CALCIUM 9.4 9.2    Recent Labs  06/20/13 1132 06/22/13 0515  WBC 6.7 4.6  NEUTROABS 5.0  --   HGB 15.0 13.8  HCT 43.8 40.1  MCV 88.8 86.6  PLT 173.0 128*    Recent Labs  06/20/13 1132  INR 1.1*    Radiology/Studies: No results found.  Cardiac catheterization: PCI Note: The left main  was engaged with a XB LAD 3.5 guiding catheter. He was given Plavix 600 mg po x 1. He was given additional IV heparin and ACT was over 250. Total Heparin IV during case was 14,000 units. I then passed a Cougar IC wire down the LAD. I could not pass a balloon beyond the mid vessel so I used another Cougar IC wire as a buddy wire for support. I was able to pass a 1.5 x 15 mm balloon into the distal vessel and performed several balloon inflations. I then passed a 2.0 x 20 mm balloon into the mid vessel and performed one inflation. I could not pass a stent down the vessel and I had poor guide support. I changed out for an EBU 3.5 guide and passed another Cougar IC wire and a Mailman wire down the LAD. I then used a 2.0 x 12 mm balloon to pre-dilate the entire mid vessel. I was then able to deliver stents. I placed a 2.25 x 18 mm Resolute Integrity DES, followed by an overlapping 2.25 x 18 mm Resolute Integrity DES in the mid vessel and then a 2.25 x 22 mm Resolute Integrity DES in the most proximal segment  of the mid vessel. The stents were post-dilated with a 2.25 x 20 mm [place] balloon x 3. The stenosis was taken from 99% down to 0%.  The sheath was removed from the right radial artery and a Terumo hemostasis band was applied at the arteriotomy site on the right wrist. There were no immediate complications. The patient was taken to the recovery area in stable condition.  Hemodynamic Findings:  Central aortic pressure: 142/72  Left ventricular pressure: 143/10/19  Angiographic Findings:  Left main: Ostial 30-40% stenosis.  Left Anterior Descending Artery: Moderate caliber vessel that courses to the apex. There is an ostial 40% calcified stenosis. The proximal vessel has diffuse 20% stenosis. The mid vessel is moderately calcified with diffuse 99% stenosis. The distal vessel has diffuse 50% stenosis. The entire vessel is diffusely diseased. The first diagonal branch is small in caliber with proximal 50% stenosis.    Circumflex Artery: Moderate caliber vessel with 20% proximal stenosis, 60% ostium of continuation of the AV groove Circumflex beyond the takeoff of the first OM branch. The first OM branch has a focal 50% stenosis.  Right Coronary Artery: Large caliber dominant calcified vessel with proximal 20% stenosis, mid 40% stenosis, distal 30% stenosis. The ostium of the posterolateral branch has 40% stenosis. The ostium of the PDA has 50% stenosis.  Left Ventricular Angiogram: LVEF=55-60%  Impression:  1. Unstable angina  2. Severe stenosis in the diffusely diseased mid LAD, now s/p successful PTCA/DES x 3 mid LAD  3. Moderate non-obstructive disease in the Circumflex and RCA  4. Normal LV systolic function  Recommendations: He will need dual anti-platelet therapy with ASA and Plavix for at least one year. Given his diabetes and diffuse CAD, would recommend dual antiplatelet therapy for lifetime. Home in am if stable.  Complications: None. The patient tolerated the procedure well.    Telemetry: SR; no arrhythmias   Assessment and Plan  Unstable angina  CAD s/p PTCA/DES x 3 to mid LAD, mod non-ob disease in Cx and RCA Preserved LV function  Hunter Mcbride is doing well post cardiac cath +PCI. His right radial site is intact without bleeding or hematoma. He is hemodynamically stable and no arrhythmias on tele. Continue medical therapy including aspirin and Plavix for at least one year. Hold metformin for 3 days. Plan for DC home today.  Signed, Azzie Roup Phineas Mcenroe PA-C

## 2013-06-22 NOTE — Discharge Summary (Signed)
CARDIOLOGY DISCHARGE SUMMARY   Patient ID: Hunter Mcbride,  MRN: [id number], DOB/AGE: [birth year] 68 y.o.  Admit date: 06/21/2013 Discharge date: 06/22/2013  Primary Care Physician: Crist Infante, MD Primary Cardiologist: Mertie Moores, MD  Primary Discharge Diagnoses:  Unstable angina  CAD s/p PTCA/DES x 3 to mid LAD, mod non-ob disease in Cx and RCA  Preserved LV function  Secondary Discharge Diagnoses:  Hypertension Dyslipidemia  Diabetes mellitus Obstructive sleep apnea Asthma Gout GERD  Procedures This Admission:  Cardiac catheterization PCI Note: The left main was engaged with a XB LAD 3.5 guiding catheter. He was given Plavix 600 mg po x 1. He was given additional IV heparin and ACT was over 250. Total Heparin IV during case was 14,000 units. I then passed a Cougar IC wire down the LAD. I could not pass a balloon beyond the mid vessel so I used another Cougar IC wire as a buddy wire for support. I was able to pass a 1.5 x 15 mm balloon into the distal vessel and performed several balloon inflations. I then passed a 2.0 x 20 mm balloon into the mid vessel and performed one inflation. I could not pass a stent down the vessel and I had poor guide support. I changed out for an EBU 3.5 guide and passed another Cougar IC wire and a Mailman wire down the LAD. I then used a 2.0 x 12 mm balloon to pre-dilate the entire mid vessel. I was then able to deliver stents. I placed a 2.25 x 18 mm Resolute Integrity DES, followed by an overlapping 2.25 x 18 mm Resolute Integrity DES in the mid vessel and then a 2.25 x 22 mm Resolute Integrity DES in the most proximal segment of the mid vessel. The stents were post-dilated with a 2.25 x 20 mm [employer] balloon x 3. The stenosis was taken from 99% down to 0%.  The sheath was removed from the right radial artery and a Terumo hemostasis band was applied at the arteriotomy site on the right wrist. There were no immediate complications. The patient was taken to the  recovery area in stable condition.  Hemodynamic Findings:  Central aortic pressure: 142/72  Left ventricular pressure: 143/10/19  Angiographic Findings:  Left main: Ostial 30-40% stenosis.  Left Anterior Descending Artery: Moderate caliber vessel that courses to the apex. There is an ostial 40% calcified stenosis. The proximal vessel has diffuse 20% stenosis. The mid vessel is moderately calcified with diffuse 99% stenosis. The distal vessel has diffuse 50% stenosis. The entire vessel is diffusely diseased. The first diagonal branch is small in caliber with proximal 50% stenosis.  Circumflex Artery: Moderate caliber vessel with 20% proximal stenosis, 60% ostium of continuation of the AV groove Circumflex beyond the takeoff of the first OM branch. The first OM branch has a focal 50% stenosis.  Right Coronary Artery: Large caliber dominant calcified vessel with proximal 20% stenosis, mid 40% stenosis, distal 30% stenosis. The ostium of the posterolateral branch has 40% stenosis. The ostium of the PDA has 50% stenosis.  Left Ventricular Angiogram: LVEF=55-60%  Impression:  1. Unstable angina  2. Severe stenosis in the diffusely diseased mid LAD, now s/p successful PTCA/DES x 3 mid LAD  3. Moderate non-obstructive disease in the Circumflex and RCA  4. Normal LV systolic function  Recommendations: He will need dual anti-platelet therapy with ASA and Plavix for at least one year. Given his diabetes and diffuse CAD, would recommend dual antiplatelet therapy for lifetime. Home in am if  stable.  Complications: None. The patient tolerated the procedure well.   History and Hospital Course:  Hunter Mcbride is a 68 year old man with HTN, dyslipidemia, DM and OSA who has had exertional chest pain x 2 months. Nuclear stress test revealed significant anteroapical ischemia and cardiac catheterization was recommended. On 06/21/2013 he underwent cardiac catheterization which demonstrated severe stenosis of the mid LAD.  This was successfully treated using DES x 3. He had non-obstructive disease noted elsewhere with preserved LV function, EF 55-60%. Mr. Gosselin tolerated this procedure well without any immediate complication. He remains hemodynamically stable and afebrile. His right radial access site is intact without significant bleeding or hematoma. He has been given discharge instructions including wound care and activity restrictions. He will follow-up in clinic in 2 weeks. He has been seen, examined and deemed stable for discharge today by Dr. Bronson Ing.  Discharge Vitals: Blood pressure 146/93, pulse 63, temperature 97.9 F (36.6 C), temperature source Oral, resp. rate 18, height 5\' 9"  (1.753 m), weight 203 lb 14.8 oz (92.5 kg), SpO2 95.00%.   Labs: Lab Results  Component Value Date   WBC 4.6 06/22/2013   HGB 13.8 06/22/2013   HCT 40.1 06/22/2013   MCV 86.6 06/22/2013   PLT 128* 06/22/2013     Recent Labs Lab 06/22/13 0515  NA 137  K 3.9  CL 100  CO2 25  BUN 13  CREATININE 0.78  CALCIUM 9.2  GLUCOSE 155*    Recent Labs  06/20/13 1132  INR 1.1*    Disposition:  The patient is being discharged in stable condition.  Follow-up:     Follow-up Information   Follow up with Richardson Dopp, PA-C On 07/11/2013. (At 8:30 AM for hospital follow-up )    Specialty:  Physician Assistant   Contact information:   [address] [address] [phone]      Discharge Medications:    Medication List         aspirin 81 MG tablet  Take 81 mg by mouth daily.     atenolol 50 MG tablet  Commonly known as:  TENORMIN  Take 1 tablet (50 mg total) by mouth daily.     clopidogrel 75 MG tablet  Commonly known as:  PLAVIX  Take 1 tablet (75 mg total) by mouth daily with breakfast.     gabapentin 300 MG capsule  Commonly known as:  NEURONTIN  Take 300 mg by mouth at bedtime.     hydrochlorothiazide 25 MG tablet  Commonly known as:  HYDRODIURIL  Take 25 mg by mouth daily.       irbesartan 300 MG tablet  Commonly known as:  AVAPRO  Take 150 mg by mouth daily.     LORazepam 0.5 MG tablet  Commonly known as:  ATIVAN  Take 0.5 mg by mouth 2 (two) times daily as needed for anxiety or sleep.     metFORMIN 500 MG 24 hr tablet  Commonly known as:  GLUCOPHAGE-XR  Take 1 tablet (500 mg total) by mouth 2 (two) times daily. HOLD for 2 days. Restart Monday 06/24/2013.     multivitamin with minerals Tabs tablet  Take 1 tablet by mouth daily.     nitroGLYCERIN 0.4 MG SL tablet  Commonly known as:  NITROSTAT  Place 1 tablet (0.4 mg total) under the tongue every 5 (five) minutes as needed. For chest pain     ONGLYZA 5 MG Tabs tablet  Generic drug:  saxagliptin HCl  Take 5 mg by mouth daily.     pantoprazole 40 MG tablet  Commonly known as:  PROTONIX  Take 40 mg by mouth daily.     rosuvastatin 20 MG tablet  Commonly known as:  CRESTOR  Take 10 mg by mouth every other day.     traMADol 50 MG tablet  Commonly known as:  ULTRAM  Take 50 mg by mouth every 6 (six) hours as needed (for pain).     TYLENOL ARTHRITIS PAIN 650 MG CR tablet  Generic drug:  acetaminophen  Take 1,300 mg by mouth 2 (two) times daily as needed. For pain       Duration of Discharge Encounter: Greater than 30 minutes including physician time.  Darrick Huntsman, PA-C 06/22/2013, 8:19 AM

## 2013-06-22 NOTE — Progress Notes (Signed)
Pt observed ambulating in hallway earlier this morning.  Pt presently dressed with monitor off, wife at bedside.  Education completed with pt and wife to include exercise guidelines, ntg protocol, alert 911 for unrelieved cp, heart healthy diet with diabetic modifications and medication compliance.  Pt declined offer for cardiac rehab due to already exercising with his wife at the [facility].  Pt feels comfortable to keep with the same regimen, informational brochure given to pt in case he reconsiders in the future.  Handouts provided, questions answered.  Maurice Small RN

## 2013-06-22 NOTE — Progress Notes (Signed)
The patient was seen and examined, and I agree with the assessment and plan as documented above, with modifications as noted below. Pt doing well this morning and is without complaints, and is eager to go home. BP has been elevated and this will need continued close monitoring, and the patient's wife has agreed to check it at home. Will plan to discharge to home today.

## 2013-06-22 NOTE — Discharge Instructions (Signed)
No driving for 3 days. No strenuous activity or lifting over 5 lbs for 1 week. Keep procedure site clean & dry. If you notice increased pain, swelling, bleeding or pus, call/return!  You may shower, but no soaking baths/hot tubs/pools for 1 week.

## 2013-06-24 ENCOUNTER — Emergency Department (HOSPITAL_COMMUNITY): Payer: Medicare Other

## 2013-06-24 ENCOUNTER — Encounter (HOSPITAL_COMMUNITY): Payer: Self-pay | Admitting: Emergency Medicine

## 2013-06-24 ENCOUNTER — Emergency Department (HOSPITAL_COMMUNITY)
Admission: EM | Admit: 2013-06-24 | Discharge: 2013-06-25 | Disposition: A | Discharge status: Home / Self Care | Payer: Medicare Other | Attending: Emergency Medicine | Admitting: Emergency Medicine

## 2013-06-24 ENCOUNTER — Other Ambulatory Visit: Payer: Self-pay

## 2013-06-24 DIAGNOSIS — Z7902 Long term (current) use of antithrombotics/antiplatelets: Secondary | ICD-10-CM | POA: Insufficient documentation

## 2013-06-24 DIAGNOSIS — M109 Gout, unspecified: Secondary | ICD-10-CM | POA: Insufficient documentation

## 2013-06-24 DIAGNOSIS — I10 Essential (primary) hypertension: Secondary | ICD-10-CM | POA: Insufficient documentation

## 2013-06-24 DIAGNOSIS — G4733 Obstructive sleep apnea (adult) (pediatric): Secondary | ICD-10-CM | POA: Insufficient documentation

## 2013-06-24 DIAGNOSIS — Z9981 Dependence on supplemental oxygen: Secondary | ICD-10-CM | POA: Insufficient documentation

## 2013-06-24 DIAGNOSIS — Z9889 Other specified postprocedural states: Secondary | ICD-10-CM | POA: Insufficient documentation

## 2013-06-24 DIAGNOSIS — K219 Gastro-esophageal reflux disease without esophagitis: Secondary | ICD-10-CM | POA: Insufficient documentation

## 2013-06-24 DIAGNOSIS — R079 Chest pain, unspecified: Secondary | ICD-10-CM | POA: Diagnosis not present

## 2013-06-24 DIAGNOSIS — I252 Old myocardial infarction: Secondary | ICD-10-CM | POA: Insufficient documentation

## 2013-06-24 DIAGNOSIS — Z7982 Long term (current) use of aspirin: Secondary | ICD-10-CM | POA: Insufficient documentation

## 2013-06-24 DIAGNOSIS — Z79899 Other long term (current) drug therapy: Secondary | ICD-10-CM | POA: Insufficient documentation

## 2013-06-24 DIAGNOSIS — E119 Type 2 diabetes mellitus without complications: Secondary | ICD-10-CM | POA: Insufficient documentation

## 2013-06-24 DIAGNOSIS — I200 Unstable angina: Secondary | ICD-10-CM | POA: Diagnosis not present

## 2013-06-24 DIAGNOSIS — J45909 Unspecified asthma, uncomplicated: Secondary | ICD-10-CM | POA: Insufficient documentation

## 2013-06-24 DIAGNOSIS — I2510 Atherosclerotic heart disease of native coronary artery without angina pectoris: Secondary | ICD-10-CM | POA: Insufficient documentation

## 2013-06-24 DIAGNOSIS — E785 Hyperlipidemia, unspecified: Secondary | ICD-10-CM | POA: Insufficient documentation

## 2013-06-24 DIAGNOSIS — Z9861 Coronary angioplasty status: Secondary | ICD-10-CM | POA: Insufficient documentation

## 2013-06-24 LAB — BASIC METABOLIC PANEL
BUN: 21 mg/dL (ref 6–23)
CO2: 22 mEq/L (ref 19–32)
CREATININE: 0.89 mg/dL (ref 0.50–1.35)
Calcium: 9.5 mg/dL (ref 8.4–10.5)
Chloride: 102 mEq/L (ref 96–112)
GFR calc Af Amer: >90 mL/min (ref >90)
GFR, EST NON AFRICAN AMERICAN: 87 mL/min — AB (ref >90)
Glucose, Bld: 235 mg/dL — ABNORMAL HIGH (ref 70–99)
POTASSIUM: 3.9 meq/L (ref 3.7–5.3)
Sodium: 140 mEq/L (ref 137–147)

## 2013-06-24 LAB — I-STAT TROPONIN, ED: Troponin i, poc: 0.01 ng/mL (ref 0.00–0.08)

## 2013-06-24 LAB — CBC
HEMATOCRIT: 41 % (ref 39.0–52.0)
Hemoglobin: 14.4 g/dL (ref 13.0–17.0)
MCH: 29.9 pg (ref 26.0–34.0)
MCHC: 35.1 g/dL (ref 30.0–36.0)
MCV: 85.1 fL (ref 78.0–100.0)
Platelets: 170 10*3/uL (ref 150–400)
RBC: 4.82 MIL/uL (ref 4.22–5.81)
RDW: 12.6 % (ref 11.5–15.5)
WBC: 5.3 10*3/uL (ref 4.0–10.5)

## 2013-06-24 LAB — TROPONIN I: Troponin I: <0.3 ng/mL (ref <0.30)

## 2013-06-24 MED ORDER — NITROGLYCERIN 0.4 MG SL SUBL
0.4000 mg | SUBLINGUAL_TABLET | SUBLINGUAL | Status: DC | PRN
Start: 2013-06-24 — End: 2013-06-25
  Administered 2013-06-24: 0.4 mg via SUBLINGUAL

## 2013-06-24 MED ORDER — KETOROLAC TROMETHAMINE 30 MG/ML IJ SOLN
30.0000 mg | Freq: Once | INTRAMUSCULAR | Status: AC
  Administered 2013-06-24: 30 mg via INTRAVENOUS
  Filled 2013-06-24: qty 1

## 2013-06-24 MED ORDER — ONDANSETRON 4 MG PO TBDP
4.0000 mg | ORAL_TABLET | Freq: Once | ORAL | Status: AC
  Administered 2013-06-24: 4 mg via ORAL
  Filled 2013-06-24: qty 1

## 2013-06-24 MED ORDER — ASPIRIN 81 MG PO CHEW
324.0000 mg | CHEWABLE_TABLET | Freq: Once | ORAL | Status: AC
  Administered 2013-06-24: 324 mg via ORAL
  Filled 2013-06-24: qty 4

## 2013-06-24 NOTE — Discharge Instructions (Signed)
As discussed, please be sure to follow up with your cardiologist tomorrow via telephone.  Be sure to tell the receptionist that you were seen in the emergency department, and per cardiology recommendations should have followup tomorrow in the office.   Return here for any concerning changes should they occur overnight.  Chest Pain (Nonspecific) It is often hard to give a specific diagnosis for the cause of chest pain. There is always a chance that your pain could be related to something serious, such as a heart attack or a blood clot in the lungs. You need to follow up with your caregiver for further evaluation. CAUSES   Heartburn.  Pneumonia or bronchitis.  Anxiety or stress.  Inflammation around your heart (pericarditis) or lung (pleuritis or pleurisy).  A blood clot in the lung.  A collapsed lung (pneumothorax). It can develop suddenly on its own (spontaneous pneumothorax) or from injury (trauma) to the chest.  Shingles infection (herpes zoster virus). The chest wall is composed of bones, muscles, and cartilage. Any of these can be the source of the pain.  The bones can be bruised by injury.  The muscles or cartilage can be strained by coughing or overwork.  The cartilage can be affected by inflammation and become sore (costochondritis). DIAGNOSIS  Lab tests or other studies, such as X-rays, electrocardiography, stress testing, or cardiac imaging, may be needed to find the cause of your pain.  TREATMENT   Treatment depends on what may be causing your chest pain. Treatment may include:  Acid blockers for heartburn.  Anti-inflammatory medicine.  Pain medicine for inflammatory conditions.  Antibiotics if an infection is present.  You may be advised to change lifestyle habits. This includes stopping smoking and avoiding alcohol, caffeine, and chocolate.  You may be advised to keep your head raised (elevated) when sleeping. This reduces the chance of acid going backward  from your stomach into your esophagus.  Most of the time, nonspecific chest pain will improve within 2 to 3 days with rest and mild pain medicine. HOME CARE INSTRUCTIONS   If antibiotics were prescribed, take your antibiotics as directed. Finish them even if you start to feel better.  For the next few days, avoid physical activities that bring on chest pain. Continue physical activities as directed.  Do not smoke.  Avoid drinking alcohol.  Only take over-the-counter or prescription medicine for pain, discomfort, or fever as directed by your caregiver.  Follow your caregiver's suggestions for further testing if your chest pain does not go away.  Keep any follow-up appointments you made. If you do not go to an appointment, you could develop lasting (chronic) problems with pain. If there is any problem keeping an appointment, you must call to reschedule. SEEK MEDICAL CARE IF:   You think you are having problems from the medicine you are taking. Read your medicine instructions carefully.  Your chest pain does not go away, even after treatment.  You develop a rash with blisters on your chest. SEEK IMMEDIATE MEDICAL CARE IF:   You have increased chest pain or pain that spreads to your arm, neck, jaw, back, or abdomen.  You develop shortness of breath, an increasing cough, or you are coughing up blood.  You have severe back or abdominal pain, feel nauseous, or vomit.  You develop severe weakness, fainting, or chills.  You have a fever. THIS IS AN EMERGENCY. Do not wait to see if the pain will go away. Get medical help at once. Call your local emergency  services (911 in [place] Do not drive yourself to the hospital. MAKE SURE YOU:   Understand these instructions.  Will watch your condition.  Will get help right away if you are not doing well or get worse. Document Released: 11/10/2004 Document Revised: 04/25/2011 Document Reviewed: 09/06/2007 [facility] Patient Information 2014  [place].

## 2013-06-24 NOTE — ED Notes (Signed)
Present with chest pain began at 6:45 today on left side of chest with radiation into neck and back associated with nausea. Pain got better after 3 nitros and began again. Pt is nauseated and slightly diaphoretic. Pt was just cardiac cath on Friday with 3 stents placed and 99% blockage, this felt exactly the same

## 2013-06-24 NOTE — ED Provider Notes (Signed)
CSN: [id number]     Arrival date & time 06/24/13  1934 History   First MD Initiated Contact with Patient 06/24/13 1946     Chief Complaint  Patient presents with  . Chest Pain     HPI  Patient presents with chest pain.  Notably, the patient had cardiac catheterization with placement of several stents within the past week. He notes that he has been generally well at home. Today, in the hours prior to ED arrival the patient developed pain is similar to that he experienced just prior to his catheterization. Pain is anterior, sharp, with radiation towards his neck and both arms. There is no associated dyspnea, no vomiting, no diarrhea, no fever, chills, cough. Pain has been relieved transiently with nitroglycerin.   Past Medical History  Diagnosis Date  . Chest pain   . Hyperlipidemia   . Hypertension   . Gout     "maybe twice in my life"  . GERD (gastroesophageal reflux disease)   . Myocardial infarction     "/Dr. Katharina Caper I've had one between 2014-2015) (06/21/2013)  . Asthma     "not sure if this is true or not" (06/21/2013)  . OSA on CPAP   . Type II diabetes mellitus   . Headache(784.0)     "weekly for the last 3-4 months" (06/21/2013)  . Arthritis     "lower back; right shoulder; left thumb; joints" (06/21/2013)  . Osteoarthritis   . CAD (coronary artery disease) May 2015    s/p PTCA/DES x 3 to mid LAD, mod non-ob disease in Cx and RCA May 2015   Past Surgical History  Procedure Laterality Date  . Cardiovascular stress test  06/10/2008    EF 68%  . Carpal tunnel release Bilateral   . Excision morton's neuroma Left   . Tonsillectomy    . Eye surgery Left     "removed film over my eye"  . Total shoulder arthroplasty  03/01/2012    Procedure: TOTAL SHOULDER ARTHROPLASTY;  Surgeon: Marin Shutter, MD;  Location: [facility];  Service: Orthopedics;  Laterality: Right;  . Cardiac catheterization  1980's    "once"  . Coronary angioplasty with stent placement  06/21/2013    "3"  . Hernia  repair Left   . Umbilical hernia repair    . Joint replacement Left 03/2013    "thumb"   Family History  Problem Relation Age of Onset  . Diabetes type II Mother   . Heart attack Father   . Coronary artery disease Cousin    History  Substance Use Topics  . Smoking status: Never Smoker   . Smokeless tobacco: Never Used  . Alcohol Use: Yes     Comment: "used to drink a little beer occasionally; never was a drinker; nothing now"    Review of Systems  Constitutional:       Per HPI, otherwise negative  HENT:       Per HPI, otherwise negative  Respiratory:       Per HPI, otherwise negative  Cardiovascular:       Per HPI, otherwise negative  Gastrointestinal: Negative for vomiting.  Endocrine:       Negative aside from HPI  Genitourinary:       Neg aside from HPI   Musculoskeletal:       Per HPI, otherwise negative  Skin: Negative.   Neurological: Negative for syncope.      Allergies  Review of patient's allergies indicates no known allergies.  Home Medications   Prior to Admission medications   Medication Sig Start Date End Date Taking? Authorizing Provider  acetaminophen (TYLENOL ARTHRITIS PAIN) 650 MG CR tablet Take 1,300 mg by mouth 2 (two) times daily as needed. For pain    Historical Provider, MD  aspirin 81 MG tablet Take 81 mg by mouth daily.      Historical Provider, MD  atenolol (TENORMIN) 50 MG tablet Take 1 tablet (50 mg total) by mouth daily. 06/20/13   Josue Hector, MD  clopidogrel (PLAVIX) 75 MG tablet Take 1 tablet (75 mg total) by mouth daily with breakfast. 06/22/13   Azzie Roup Edmisten, PA-C  gabapentin (NEURONTIN) 300 MG capsule Take 300 mg by mouth at bedtime.      Historical Provider, MD  hydrochlorothiazide (HYDRODIURIL) 25 MG tablet Take 25 mg by mouth daily.  05/28/13   Historical Provider, MD  irbesartan (AVAPRO) 300 MG tablet Take 150 mg by mouth daily.     Historical Provider, MD  LORazepam (ATIVAN) 0.5 MG tablet Take 0.5 mg by mouth 2 (two)  times daily as needed for anxiety or sleep.  06/01/13   Historical Provider, MD  metFORMIN (GLUCOPHAGE-XR) 500 MG 24 hr tablet Take 1 tablet (500 mg total) by mouth 2 (two) times daily. HOLD for 2 days. Restart Monday 06/24/2013. 06/22/13   Azzie Roup Edmisten, PA-C  Multiple Vitamin (MULTIVITAMIN WITH MINERALS) TABS tablet Take 1 tablet by mouth daily.    Historical Provider, MD  nitroGLYCERIN (NITROSTAT) 0.4 MG SL tablet Place 1 tablet (0.4 mg total) under the tongue every 5 (five) minutes as needed. For chest pain 06/03/13   Thayer Headings, MD  ONGLYZA 5 MG TABS tablet Take 5 mg by mouth daily.  05/24/13   Historical Provider, MD  pantoprazole (PROTONIX) 40 MG tablet Take 40 mg by mouth daily.    Historical Provider, MD  rosuvastatin (CRESTOR) 20 MG tablet Take 10 mg by mouth every other day.    Historical Provider, MD  traMADol (ULTRAM) 50 MG tablet Take 50 mg by mouth every 6 (six) hours as needed (for pain).  04/22/13   Historical Provider, MD   BP 139/92  Pulse 73  Temp(Src) 98.9 F (37.2 C) (Oral)  Resp 18  SpO2 97% Physical Exam  Nursing note and vitals reviewed. Constitutional: He is oriented to person, place, and time. He appears well-developed. No distress.  HENT:  Head: Normocephalic and atraumatic.  Eyes: Conjunctivae and EOM are normal.  Cardiovascular: Normal rate and regular rhythm.   Pulmonary/Chest: Effort normal. No stridor. No respiratory distress.  Abdominal: He exhibits no distension.  Musculoskeletal: He exhibits no edema.  Puncture area in the right wrist appears well.  Neurological: He is alert and oriented to person, place, and time.  Skin: Skin is warm and dry.  Psychiatric: He has a normal mood and affect.    ED Course  Procedures (including critical care time) Labs Review Labs Reviewed  BASIC METABOLIC PANEL - Abnormal; Notable for the following:    Glucose, Bld 235 (*)    GFR calc non Af Amer 87 (*)    All other components within normal limits  CBC   TROPONIN I  I-STAT TROPOININ, ED    ECG SR 74, nml  Cardiac: 70 sr, nml  O2 -99%ra,nml   after the initial evaluation I reviewed the patient's chart.    9:14 PM Initial labs reassuring.  I discussed his presentation with cardiology and we reviewed recent cath.  11:15  PM Repeat trop normal  Repeat ECG similar to first - SR, 64, unremarkable. The patient, his wife and I had a lengthy conversation about all results, the low probability of his pain coming from occlusion of his stents or ongoing ischemia. Patient has recently stopped diltiazem, started atenolol, will discuss this with his cardiologist tomorrow.  Per cardiologist recommendation, with serial troponins are negative, 2 unremarkable EKGs, and no ongoing pain, patient will follow up in clinic tomorrow.   MDM   Patient presents with chest pain.  Notably, the patient does have catheterization last week. After initial evaluation I discussed his case with our cardiology team.  Per their recommendation, with no evidence of ongoing ischemia via serial troponins, EKGs, continuous monitoring, the patient is appropriate for followup tomorrow. Patient is on Plavix, aspirin, and remained in no distress, hemodynamically stable throughout his emergency department course, and this seems reasonable.   Carmin Muskrat, MD 06/24/13 [phone]

## 2013-06-25 ENCOUNTER — Emergency Department (HOSPITAL_COMMUNITY): Payer: Medicare Other

## 2013-06-25 ENCOUNTER — Inpatient Hospital Stay (HOSPITAL_COMMUNITY)
Admission: EM | Admit: 2013-06-25 | Discharge: 2013-06-27 | DRG: 287 | Disposition: A | Discharge status: Home / Self Care | Payer: Medicare Other | Attending: Cardiology | Admitting: Cardiology

## 2013-06-25 ENCOUNTER — Ambulatory Visit (INDEPENDENT_AMBULATORY_CARE_PROVIDER_SITE_OTHER): Payer: Medicare Other | Admitting: Nurse Practitioner

## 2013-06-25 ENCOUNTER — Telehealth: Payer: Self-pay | Admitting: Cardiovascular Disease

## 2013-06-25 ENCOUNTER — Encounter: Payer: Self-pay | Admitting: Nurse Practitioner

## 2013-06-25 ENCOUNTER — Encounter (HOSPITAL_COMMUNITY): Payer: Self-pay | Admitting: Pharmacy Technician

## 2013-06-25 ENCOUNTER — Encounter (HOSPITAL_COMMUNITY): Payer: Self-pay | Admitting: Emergency Medicine

## 2013-06-25 VITALS — BP 130/80 | HR 60 | Ht 69.0 in | Wt 206.0 lb

## 2013-06-25 DIAGNOSIS — Z9861 Coronary angioplasty status: Secondary | ICD-10-CM

## 2013-06-25 DIAGNOSIS — I252 Old myocardial infarction: Secondary | ICD-10-CM | POA: Diagnosis not present

## 2013-06-25 DIAGNOSIS — R079 Chest pain, unspecified: Secondary | ICD-10-CM

## 2013-06-25 DIAGNOSIS — G4733 Obstructive sleep apnea (adult) (pediatric): Secondary | ICD-10-CM | POA: Diagnosis present

## 2013-06-25 DIAGNOSIS — I517 Cardiomegaly: Secondary | ICD-10-CM | POA: Diagnosis not present

## 2013-06-25 DIAGNOSIS — I200 Unstable angina: Secondary | ICD-10-CM

## 2013-06-25 DIAGNOSIS — I2510 Atherosclerotic heart disease of native coronary artery without angina pectoris: Secondary | ICD-10-CM | POA: Diagnosis not present

## 2013-06-25 DIAGNOSIS — I10 Essential (primary) hypertension: Secondary | ICD-10-CM | POA: Diagnosis not present

## 2013-06-25 DIAGNOSIS — Z833 Family history of diabetes mellitus: Secondary | ICD-10-CM

## 2013-06-25 DIAGNOSIS — F411 Generalized anxiety disorder: Secondary | ICD-10-CM | POA: Diagnosis present

## 2013-06-25 DIAGNOSIS — Z8249 Family history of ischemic heart disease and other diseases of the circulatory system: Secondary | ICD-10-CM | POA: Diagnosis not present

## 2013-06-25 DIAGNOSIS — K219 Gastro-esophageal reflux disease without esophagitis: Secondary | ICD-10-CM | POA: Diagnosis present

## 2013-06-25 DIAGNOSIS — R51 Headache: Secondary | ICD-10-CM | POA: Diagnosis present

## 2013-06-25 DIAGNOSIS — I498 Other specified cardiac arrhythmias: Secondary | ICD-10-CM | POA: Diagnosis present

## 2013-06-25 DIAGNOSIS — E785 Hyperlipidemia, unspecified: Secondary | ICD-10-CM | POA: Diagnosis present

## 2013-06-25 DIAGNOSIS — E119 Type 2 diabetes mellitus without complications: Secondary | ICD-10-CM | POA: Diagnosis not present

## 2013-06-25 DIAGNOSIS — J45909 Unspecified asthma, uncomplicated: Secondary | ICD-10-CM | POA: Diagnosis present

## 2013-06-25 DIAGNOSIS — M1990 Unspecified osteoarthritis, unspecified site: Secondary | ICD-10-CM | POA: Diagnosis present

## 2013-06-25 DIAGNOSIS — Z955 Presence of coronary angioplasty implant and graft: Secondary | ICD-10-CM

## 2013-06-25 LAB — BASIC METABOLIC PANEL
BUN: 25 mg/dL — ABNORMAL HIGH (ref 6–23)
BUN: 23 mg/dL (ref 6–23)
CALCIUM: 9.9 mg/dL (ref 8.4–10.5)
CO2: 26 mEq/L (ref 19–32)
CO2: 22 meq/L (ref 19–32)
CREATININE: 0.92 mg/dL (ref 0.50–1.35)
Calcium: 9.5 mg/dL (ref 8.4–10.5)
Chloride: 103 mEq/L (ref 96–112)
Chloride: 99 mEq/L (ref 96–112)
Creatinine, Ser: 1 mg/dL (ref 0.4–1.5)
GFR calc Af Amer: >90 mL/min (ref >90)
GFR calc non Af Amer: 86 mL/min — ABNORMAL LOW (ref >90)
GFR: 80.23 mL/min (ref >60.00)
Glucose, Bld: 295 mg/dL — ABNORMAL HIGH (ref 70–99)
Glucose, Bld: 250 mg/dL — ABNORMAL HIGH (ref 70–99)
Potassium: 3.8 mEq/L (ref 3.5–5.1)
Potassium: 3.8 mEq/L (ref 3.7–5.3)
Sodium: 137 mEq/L (ref 135–145)
Sodium: 137 mEq/L (ref 137–147)

## 2013-06-25 LAB — CBC
HCT: 41.5 % (ref 39.0–52.0)
HEMATOCRIT: 40.7 % (ref 39.0–52.0)
HEMOGLOBIN: 14.4 g/dL (ref 13.0–17.0)
Hemoglobin: 14.2 g/dL (ref 13.0–17.0)
MCH: 30.2 pg (ref 26.0–34.0)
MCHC: 34.3 g/dL (ref 30.0–36.0)
MCHC: 35.4 g/dL (ref 30.0–36.0)
MCV: 88.5 fl (ref 78.0–100.0)
MCV: 85.3 fL (ref 78.0–100.0)
Platelets: 204 10*3/uL (ref 150.0–400.0)
Platelets: 188 10*3/uL (ref 150–400)
RBC: 4.68 Mil/uL (ref 4.22–5.81)
RBC: 4.77 MIL/uL (ref 4.22–5.81)
RDW: 12.8 % (ref 11.5–15.5)
RDW: 12.5 % (ref 11.5–15.5)
WBC: 5.4 10*3/uL (ref 4.0–10.5)
WBC: 5.5 10*3/uL (ref 4.0–10.5)

## 2013-06-25 LAB — PROTIME-INR
INR: 1.1 ratio — ABNORMAL HIGH (ref 0.8–1.0)
INR: 1.05 (ref 0.00–1.49)
Prothrombin Time: 11.9 s (ref 9.6–13.1)
Prothrombin Time: 13.5 seconds (ref 11.6–15.2)

## 2013-06-25 LAB — I-STAT TROPONIN, ED: TROPONIN I, POC: 0 ng/mL (ref 0.00–0.08)

## 2013-06-25 LAB — APTT
APTT: 33 s (ref 24–37)
aPTT: 28.7 s (ref 21.7–28.8)

## 2013-06-25 LAB — TROPONIN I

## 2013-06-25 LAB — PRO B NATRIURETIC PEPTIDE: Pro B Natriuretic peptide (BNP): 30 pg/mL (ref 0–125)

## 2013-06-25 LAB — CBG MONITORING, ED: GLUCOSE-CAPILLARY: 217 mg/dL — AB (ref 70–99)

## 2013-06-25 MED ORDER — MORPHINE SULFATE 4 MG/ML IJ SOLN
4.0000 mg | Freq: Once | INTRAMUSCULAR | Status: AC
  Administered 2013-06-25: 4 mg via INTRAVENOUS
  Filled 2013-06-25: qty 1

## 2013-06-25 MED ORDER — HEPARIN BOLUS VIA INFUSION
4000.0000 [IU] | Freq: Once | INTRAVENOUS | Status: AC
  Administered 2013-06-25: 4000 [IU] via INTRAVENOUS
  Filled 2013-06-25: qty 4000

## 2013-06-25 MED ORDER — SODIUM CHLORIDE 0.9 % IJ SOLN
3.0000 mL | Freq: Two times a day (BID) | INTRAMUSCULAR | Status: DC
  Administered 2013-06-25: 3 mL via INTRAVENOUS

## 2013-06-25 MED ORDER — ASPIRIN EC 81 MG PO TBEC
81.0000 mg | DELAYED_RELEASE_TABLET | Freq: Every day | ORAL | Status: DC
  Administered 2013-06-26: 81 mg via ORAL
  Filled 2013-06-25 (×2): qty 1

## 2013-06-25 MED ORDER — AMLODIPINE BESYLATE 5 MG PO TABS: 5.0000 mg | ORAL_TABLET | Freq: Every day | ORAL | Status: DC

## 2013-06-25 MED ORDER — SODIUM CHLORIDE 0.9 % IV SOLN: 250.0000 mL | INTRAVENOUS | Status: DC | PRN

## 2013-06-25 MED ORDER — AMLODIPINE BESYLATE 5 MG PO TABS
5.0000 mg | ORAL_TABLET | Freq: Every day | ORAL | Status: DC
  Administered 2013-06-26: 13:00:00 5 mg via ORAL
  Filled 2013-06-25 (×2): qty 1

## 2013-06-25 MED ORDER — LORAZEPAM 0.5 MG PO TABS
0.5000 mg | ORAL_TABLET | Freq: Four times a day (QID) | ORAL | Status: DC | PRN
  Administered 2013-06-26 (×2): 0.5 mg via ORAL
  Filled 2013-06-25 (×2): qty 1

## 2013-06-25 MED ORDER — GABAPENTIN 300 MG PO CAPS
300.0000 mg | ORAL_CAPSULE | Freq: Every day | ORAL | Status: DC
  Administered 2013-06-26 (×2): 300 mg via ORAL
  Filled 2013-06-25 (×3): qty 1

## 2013-06-25 MED ORDER — SODIUM CHLORIDE 0.9 % IJ SOLN
3.0000 mL | Freq: Two times a day (BID) | INTRAMUSCULAR | Status: DC
  Administered 2013-06-26: 3 mL via INTRAVENOUS

## 2013-06-25 MED ORDER — SODIUM CHLORIDE 0.9 % IV SOLN
INTRAVENOUS | Status: DC
  Administered 2013-06-26: 125 mL/h via INTRAVENOUS
  Administered 2013-06-26: 50 mL/h via INTRAVENOUS

## 2013-06-25 MED ORDER — SODIUM CHLORIDE 0.9 % IJ SOLN: 3.0000 mL | INTRAMUSCULAR | Status: DC | PRN

## 2013-06-25 MED ORDER — ONDANSETRON HCL 4 MG/2ML IJ SOLN
4.0000 mg | Freq: Once | INTRAMUSCULAR | Status: AC
  Administered 2013-06-25: 4 mg via INTRAVENOUS
  Filled 2013-06-25: qty 2

## 2013-06-25 MED ORDER — NITROGLYCERIN IN D5W 200-5 MCG/ML-% IV SOLN
2.0000 ug/min | Freq: Once | INTRAVENOUS | Status: AC
  Administered 2013-06-25: 5 ug/min via INTRAVENOUS
  Filled 2013-06-25: qty 250

## 2013-06-25 MED ORDER — MORPHINE SULFATE 4 MG/ML IJ SOLN
4.0000 mg | INTRAMUSCULAR | Status: DC | PRN
  Administered 2013-06-25 – 2013-06-26 (×2): 4 mg via INTRAVENOUS
  Filled 2013-06-25 (×2): qty 1

## 2013-06-25 MED ORDER — IRBESARTAN 150 MG PO TABS
150.0000 mg | ORAL_TABLET | Freq: Every day | ORAL | Status: DC
  Administered 2013-06-26: 13:00:00 150 mg via ORAL
  Filled 2013-06-25 (×2): qty 1

## 2013-06-25 MED ORDER — NITROGLYCERIN IN D5W 200-5 MCG/ML-% IV SOLN
2.0000 ug/min | INTRAVENOUS | Status: DC
  Administered 2013-06-26: 15 ug/min via INTRAVENOUS

## 2013-06-25 MED ORDER — INSULIN ASPART 100 UNIT/ML ~~LOC~~ SOLN
4.0000 [IU] | Freq: Once | SUBCUTANEOUS | Status: AC
  Administered 2013-06-25: 4 [IU] via SUBCUTANEOUS
  Filled 2013-06-25: qty 1

## 2013-06-25 MED ORDER — FAMOTIDINE 20 MG PO TABS
20.0000 mg | ORAL_TABLET | Freq: Every day | ORAL | Status: DC
  Administered 2013-06-26: 20 mg via ORAL
  Filled 2013-06-25 (×2): qty 1

## 2013-06-25 MED ORDER — DIAZEPAM 5 MG PO TABS
10.0000 mg | ORAL_TABLET | ORAL | Status: AC
  Administered 2013-06-26: 10 mg via ORAL
  Filled 2013-06-25: qty 2

## 2013-06-25 MED ORDER — HYDROCHLOROTHIAZIDE 25 MG PO TABS
25.0000 mg | ORAL_TABLET | Freq: Every day | ORAL | Status: DC
  Administered 2013-06-26: 13:00:00 25 mg via ORAL
  Filled 2013-06-25 (×2): qty 1

## 2013-06-25 MED ORDER — HEPARIN (PORCINE) IN NACL 100-0.45 UNIT/ML-% IJ SOLN
1250.0000 [IU]/h | INTRAMUSCULAR | Status: DC
  Administered 2013-06-25: 1250 [IU]/h via INTRAVENOUS
  Filled 2013-06-25 (×2): qty 250

## 2013-06-25 MED ORDER — INSULIN ASPART 100 UNIT/ML ~~LOC~~ SOLN
0.0000 [IU] | SUBCUTANEOUS | Status: DC
  Administered 2013-06-26 (×2): 2 [IU] via SUBCUTANEOUS
  Administered 2013-06-26: 13:00:00 5 [IU] via SUBCUTANEOUS

## 2013-06-25 MED ORDER — ATENOLOL 50 MG PO TABS
50.0000 mg | ORAL_TABLET | Freq: Every day | ORAL | Status: DC
  Administered 2013-06-26: 50 mg via ORAL
  Filled 2013-06-25 (×2): qty 1

## 2013-06-25 MED ORDER — ASPIRIN 81 MG PO CHEW: 81.0000 mg | CHEWABLE_TABLET | ORAL | Status: DC

## 2013-06-25 MED ORDER — ATORVASTATIN CALCIUM 80 MG PO TABS
80.0000 mg | ORAL_TABLET | Freq: Every day | ORAL | Status: DC
  Administered 2013-06-26: 18:00:00 80 mg via ORAL
  Filled 2013-06-25 (×3): qty 1

## 2013-06-25 MED ORDER — CLOPIDOGREL BISULFATE 75 MG PO TABS
75.0000 mg | ORAL_TABLET | Freq: Every day | ORAL | Status: DC
  Administered 2013-06-26 – 2013-06-27 (×2): 75 mg via ORAL
  Filled 2013-06-25 (×3): qty 1

## 2013-06-25 NOTE — ED Provider Notes (Signed)
CSN: [id number]     Arrival date & time 06/25/13  1847 History   First MD Initiated Contact with Patient 06/25/13 1937     Chief Complaint  Patient presents with  . Chest Pain     (Consider location/radiation/quality/duration/timing/severity/associated sxs/prior Treatment) Patient is a 68 y.o. male presenting with chest pain. The history is provided by the patient.  Chest Pain Pain location:  L chest Associated symptoms: nausea   Associated symptoms: no abdominal pain, no back pain, no headache, no numbness, no shortness of breath, not vomiting and no weakness    patient is complaining of left-sided chest pain going to his arm and back. This is similar to the chest pain that he had when he had a heart cath with 3 stents 4 days ago. He was seen in ER yesterday with similar symptoms and had negative enzymes at that time. He follow with his cardiologist today who scheduled a heart catheterization for tomorrow. He is continued pain. His been somewhat relieved with nitroglycerin but it continues. He's had nausea without vomiting. No fevers. No shortness of breath. No diarrhea. No diaphoresis. He is on Plavix. No blood in the stool.  Past Medical History  Diagnosis Date  . Chest pain   . Hyperlipidemia   . Hypertension   . Gout     "maybe twice in my life"  . GERD (gastroesophageal reflux disease)   . Myocardial infarction     "/Dr. Katharina Caper I've had one between 2014-2015) (06/21/2013)  . Asthma     "not sure if this is true or not" (06/21/2013)  . OSA on CPAP   . Type II diabetes mellitus   . Headache(784.0)     "weekly for the last 3-4 months" (06/21/2013)  . Arthritis     "lower back; right shoulder; left thumb; joints" (06/21/2013)  . Osteoarthritis   . CAD (coronary artery disease) May 2015    s/p PTCA/DES x 3 to mid LAD, mod non-ob disease in Cx and RCA May 2015   Past Surgical History  Procedure Laterality Date  . Cardiovascular stress test  06/10/2008    EF 68%  . Carpal tunnel  release Bilateral   . Excision morton's neuroma Left   . Tonsillectomy    . Eye surgery Left     "removed film over my eye"  . Total shoulder arthroplasty  03/01/2012    Procedure: TOTAL SHOULDER ARTHROPLASTY;  Surgeon: Marin Shutter, MD;  Location: [facility];  Service: Orthopedics;  Laterality: Right;  . Cardiac catheterization  1980's    "once"  . Coronary angioplasty with stent placement  06/21/2013    "3"  . Hernia repair Left   . Umbilical hernia repair    . Joint replacement Left 03/2013    "thumb"   Family History  Problem Relation Age of Onset  . Diabetes type II Mother   . Heart attack Father   . Coronary artery disease Cousin   . Heart disease Brother    History  Substance Use Topics  . Smoking status: Never Smoker   . Smokeless tobacco: Never Used  . Alcohol Use: Yes     Comment: "used to drink a little beer occasionally; never was a drinker; nothing now"    Review of Systems  Constitutional: Negative for activity change and appetite change.  Eyes: Negative for pain.  Respiratory: Negative for chest tightness and shortness of breath.   Cardiovascular: Positive for chest pain. Negative for leg swelling.  Gastrointestinal: Positive for  nausea. Negative for vomiting, abdominal pain and diarrhea.  Genitourinary: Negative for flank pain.  Musculoskeletal: Negative for back pain and neck stiffness.  Skin: Negative for rash.  Neurological: Negative for weakness, numbness and headaches.  Psychiatric/Behavioral: Negative for behavioral problems.      Allergies  Review of patient's allergies indicates no known allergies.  Home Medications   Prior to Admission medications   Medication Sig Start Date End Date Taking? Authorizing Provider  acetaminophen (TYLENOL ARTHRITIS PAIN) 650 MG CR tablet Take 1,300 mg by mouth 2 (two) times daily as needed. For pain   Yes Historical Provider, MD  amLODipine (NORVASC) 5 MG tablet Take 1 tablet (5 mg total) by mouth daily. 06/25/13   Yes Burtis Junes, NP  aspirin 81 MG tablet Take 81 mg by mouth at bedtime.    Yes Historical Provider, MD  atenolol (TENORMIN) 50 MG tablet Take 1 tablet (50 mg total) by mouth daily. 06/20/13  Yes Josue Hector, MD  calcium carbonate (TUMS - DOSED IN MG ELEMENTAL CALCIUM) 500 MG chewable tablet Chew 2 tablets by mouth daily as needed for indigestion or heartburn.   Yes Historical Provider, MD  chlorpheniramine-HYDROcodone (TUSSIONEX) 10-8 MG/5ML LQCR Take 5 mLs by mouth every 12 (twelve) hours as needed for cough.   Yes Historical Provider, MD  cimetidine (TAGAMET) 200 MG tablet Take 200 mg by mouth daily as needed (for heartburn).   Yes Historical Provider, MD  clopidogrel (PLAVIX) 75 MG tablet Take 1 tablet (75 mg total) by mouth daily with breakfast. 06/22/13  Yes Brooke O Edmisten, PA-C  gabapentin (NEURONTIN) 300 MG capsule Take 300 mg by mouth at bedtime.     Yes Historical Provider, MD  hydrochlorothiazide (HYDRODIURIL) 25 MG tablet Take 25 mg by mouth daily.  05/28/13  Yes Historical Provider, MD  irbesartan (AVAPRO) 300 MG tablet Take 150 mg by mouth daily.    Yes Historical Provider, MD  LORazepam (ATIVAN) 0.5 MG tablet Take 0.5 mg by mouth as needed for anxiety.  06/01/13  Yes Historical Provider, MD  metFORMIN (GLUCOPHAGE-XR) 500 MG 24 hr tablet Take 1 tablet (500 mg total) by mouth 2 (two) times daily. HOLD for 2 days. Restart Monday 06/24/2013. 06/22/13  Yes Brooke O Edmisten, PA-C  Multiple Vitamin (MULTIVITAMIN WITH MINERALS) TABS tablet Take 1 tablet by mouth daily.   Yes Historical Provider, MD  nitroGLYCERIN (NITROSTAT) 0.4 MG SL tablet Place 1 tablet (0.4 mg total) under the tongue every 5 (five) minutes as needed. For chest pain 06/03/13  Yes Thayer Headings, MD  ONGLYZA 5 MG TABS tablet Take 5 mg by mouth daily.  05/24/13  Yes Historical Provider, MD  pantoprazole (PROTONIX) 40 MG tablet Take 40 mg by mouth daily.   Yes Historical Provider, MD  rosuvastatin (CRESTOR) 20 MG tablet Take  10 mg by mouth every other day.   Yes Historical Provider, MD  traMADol (ULTRAM) 50 MG tablet Take 50 mg by mouth every 6 (six) hours as needed (for pain).  04/22/13  Yes Historical Provider, MD   BP 137/84  Pulse 62  Temp(Src) 98.5 F (36.9 C) (Oral)  Resp 16  Ht 5\' 9"  (1.753 m)  Wt 203 lb 11.3 oz (92.4 kg)  BMI 30.07 kg/m2  SpO2 100% Physical Exam  Nursing note and vitals reviewed. Constitutional: He is oriented to person, place, and time. He appears well-developed and well-nourished.  Patient appears uncomfortable  HENT:  Head: Normocephalic and atraumatic.  Eyes: EOM are normal. Pupils  are equal, round, and reactive to light.  Neck: Normal range of motion. Neck supple.  Cardiovascular: Normal rate, regular rhythm and normal heart sounds.   No murmur heard. Pulmonary/Chest: Effort normal and breath sounds normal.  Abdominal: Soft. Bowel sounds are normal. He exhibits no distension and no mass. There is no tenderness. There is no rebound and no guarding.  Musculoskeletal: Normal range of motion. He exhibits no edema.  Neurological: He is alert and oriented to person, place, and time. No cranial nerve deficit.  Skin: Skin is warm and dry.  Psychiatric: He has a normal mood and affect.    ED Course  Procedures (including critical care time) Labs Review Labs Reviewed  BASIC METABOLIC PANEL - Abnormal; Notable for the following:    Glucose, Bld 250 (*)    GFR calc non Af Amer 86 (*)    All other components within normal limits  CBG MONITORING, ED - Abnormal; Notable for the following:    Glucose-Capillary 217 (*)    All other components within normal limits  MRSA PCR SCREENING  CBC  PRO B NATRIURETIC PEPTIDE  PROTIME-INR  APTT  TROPONIN I  HEPARIN LEVEL (UNFRACTIONATED)  CBC  HEMOGLOBIN A1C  TROPONIN I  TROPONIN I  TROPONIN I  CBC WITH DIFFERENTIAL  COMPREHENSIVE METABOLIC PANEL  MAGNESIUM  PLATELET INHIBITION P2Y12  Randolm Idol, ED    Imaging Review Dg  Chest Port 1 View  06/25/2013   CLINICAL DATA:  Chest pain  EXAM: PORTABLE CHEST - 1 VIEW  COMPARISON:  02/27/2012  FINDINGS: Lungs are clear.  No pleural effusion or pneumothorax.  The heart is normal in size.  Right shoulder arthroplasty.  IMPRESSION: No evidence of acute cardiopulmonary disease.   Electronically Signed   By: Julian Hy M.D.   On: 06/25/2013 20:35     EKG Interpretation   Date/Time:  Tuesday Jun 25 2013 18:50:11 EDT Ventricular Rate:  65 PR Interval:  184 QRS Duration: 90 QT Interval:  380 QTC Calculation: 395 R Axis:   34 Text Interpretation:  Normal sinus rhythm Possible Inferior infarct , age  undetermined Abnormal ECG No significant change since last tracing  Confirmed by Alvino Chapel  MD, Ovid Curd [id number]) on 06/25/2013 7:41:17 PM      MDM   Final diagnoses:  Unstable angina    Patient with chest pain. Had stents 3 days ago but plan for recath tomorrow to 2 recurrent and persistent pain. Enzymes negative. EKG is stable. Discussed with cardiology. Will start heparin and nitroglycerin drip and admitted to hospital for likely cath tomorrow.  CRITICAL CARE Performed by: Jasper Riling. Kenshawn Maciolek Total critical care time: 30 Critical care time was exclusive of separately billable procedures and treating other patients. Critical care was necessary to treat or prevent imminent or life-threatening deterioration. Critical care was time spent personally by me on the following activities: development of treatment plan with patient and/or surrogate as well as nursing, discussions with consultants, evaluation of patient's response to treatment, examination of patient, obtaining history from patient or surrogate, ordering and performing treatments and interventions, ordering and review of laboratory studies, ordering and review of radiographic studies, pulse oximetry and re-evaluation of patient's condition.    Jasper Riling. Alvino Chapel, MD 06/26/13 0010

## 2013-06-25 NOTE — Telephone Encounter (Signed)
I spoke with pt wife, [name], states pt is feeling tired this morning. No angina noted. Agrees with follow-up post ED appointment with [name] today at [facility]

## 2013-06-25 NOTE — Progress Notes (Addendum)
ANTICOAGULATION CONSULT NOTE - Initial Consult  Pharmacy Consult for Heparin Indication: chest pain/ACS  No Known Allergies  Patient Measurements:   Heparin Dosing Weight: ~90 kg  Vital Signs: Temp: 97.5 F (36.4 C) (05/12 1854) Temp src: Oral (05/12 1854) BP: 130/77 mmHg (05/12 1902) Pulse Rate: 64 (05/12 1902)  Labs:  Recent Labs  06/24/13 1943 06/24/13 2154 06/25/13 1500 06/25/13 1902  HGB 14.4  --  14.2 14.4  HCT 41.0  --  41.5 40.7  PLT 170  --  204.0 188  APTT  --   --  28.7  --   LABPROT  --   --  11.9  --   INR  --   --  1.1*  --   CREATININE 0.89  --  1.0 0.92  TROPONINI  --  <0.30  --   --     The CrCl is unknown because both a height and weight (above a minimum accepted value) are required for this calculation.   Medical History: Past Medical History  Diagnosis Date  . Chest pain   . Hyperlipidemia   . Hypertension   . Gout     "maybe twice in my life"  . GERD (gastroesophageal reflux disease)   . Myocardial infarction     "/Dr. Katharina Caper I've had one between 2014-2015) (06/21/2013)  . Asthma     "not sure if this is true or not" (06/21/2013)  . OSA on CPAP   . Type II diabetes mellitus   . Headache(784.0)     "weekly for the last 3-4 months" (06/21/2013)  . Arthritis     "lower back; right shoulder; left thumb; joints" (06/21/2013)  . Osteoarthritis   . CAD (coronary artery disease) May 2015    s/p PTCA/DES x 3 to mid LAD, mod non-ob disease in Cx and RCA May 2015    Assessment: 68 yo male admitted with chest pain.  Saw OP cardiologist today, and is scheduled for cath tomorrow. Hx HLD, HTN, GERD, hx MI (s/p PTCA/DES x 3), DM . Pt reports hx ulcers over 30 yrs ago, but no other hx bleeding.  No anticoagulants PTA.  Goal of Therapy:  Heparin level 0.3-0.7 units/ml Monitor platelets by anticoagulation protocol: Yes   Plan:  1. Start IV heparin with bolus of 4000 units IV x 1. 2. Then start heparin gtt at rate of 1250 units/hr. 3. Check heparin  level 6 hrs after gtt starts. 4. Daily heparin level and CBC.  Uvaldo Rising, BCPS  Clinical Pharmacist Pager [phone]  06/25/2013 8:17 PM

## 2013-06-25 NOTE — Progress Notes (Signed)
Hunter Mcbride Date of Birth: Mar 30, 1945 Medical Record #[id number]  History of Present Illness: Hunter Mcbride is seen back today for a post hospital/ER visit. Seen for Dr. Acie Mcbride. His wife is a former patient of Dr. Susa Mcbride that I have taken care of. Hunter Mcbride has HTN, HLD, DM and OSA. Past normal Myoview study.   He most recently presented with a 2 month history of exertional chest pain - had abnormal Myoview - then cathed and had DES x 3 to the LAD. Otherwise had moderate but nonobstructive disease  With a normal EF of 55 to 60%. He was discharged 06/22/13. On Plavix.   Called earlier today - was back in the ER yesterday - had chest pain - similar to his past chest pain syndrome. EKG negative. Troponins negative. Sent here for further evaluation.  Comes back today. Here with his wife - Hunter Mcbride. He tells me that on Sunday he did ok. Monday he felt tired. He laid down to take a nap - work up with his upper back/neck hurting that radiated down the back of both arms - identical to what he had on the treadmill last week - went outside and it eased off - came inside and he had recurrence - he got scared - then had a pressure sensation in his left chest - wife gave him a NTG with immediate relief. He went back outside - had recurrent back/neck pain going down both arms again - took his 2nd NTG with relief. Wife put him in the car to go to the ER - had recurrence on the drive and took his 31rd. In the ER he was given his 37th. Ekg negative. Troponin negative. Has done ok today but a little discomfort in the left chest earlier today - no NTG use. Not short of breath. Not dizzy or lightheaded. Remains on his Plavix - this would be a new med for him along with the atenolol.    Current Outpatient Prescriptions  Medication Sig Dispense Refill  . acetaminophen (TYLENOL ARTHRITIS PAIN) 650 MG CR tablet Take 1,300 mg by mouth 2 (two) times daily as needed. For pain      . aspirin 81 MG tablet Take 81 mg by mouth at  bedtime.       [name] atenolol (TENORMIN) 50 MG tablet Take 1 tablet (50 mg total) by mouth daily.  30 tablet  3  . calcium carbonate (TUMS - DOSED IN MG ELEMENTAL CALCIUM) 500 MG chewable tablet Chew 2 tablets by mouth daily as needed for indigestion or heartburn.      . chlorpheniramine-HYDROcodone (TUSSIONEX) 10-8 MG/5ML LQCR Take 5 mLs by mouth every 12 (twelve) hours as needed for cough.      . cimetidine (TAGAMET) 200 MG tablet Take 200 mg by mouth daily as needed (for heartburn).      . clopidogrel (PLAVIX) 75 MG tablet Take 1 tablet (75 mg total) by mouth daily with breakfast.  30 tablet  11  . gabapentin (NEURONTIN) 300 MG capsule Take 300 mg by mouth at bedtime.        . hydrochlorothiazide (HYDRODIURIL) 25 MG tablet Take 25 mg by mouth daily.       . irbesartan (AVAPRO) 300 MG tablet Take 150 mg by mouth daily.       [name] LORazepam (ATIVAN) 0.5 MG tablet Take 0.5 mg by mouth as needed.       . metFORMIN (GLUCOPHAGE-XR) 500 MG 24 hr tablet Take 1 tablet (500 mg total) by  mouth 2 (two) times daily. HOLD for 2 days. Restart Monday 06/24/2013.      . Multiple Vitamin (MULTIVITAMIN WITH MINERALS) TABS tablet Take 1 tablet by mouth daily.      . nitroGLYCERIN (NITROSTAT) 0.4 MG SL tablet Place 1 tablet (0.4 mg total) under the tongue every 5 (five) minutes as needed. For chest pain  25 tablet  5  . ONGLYZA 5 MG TABS tablet Take 5 mg by mouth daily.       . pantoprazole (PROTONIX) 40 MG tablet Take 40 mg by mouth daily.      . rosuvastatin (CRESTOR) 20 MG tablet Take 10 mg by mouth every other day.      . traMADol (ULTRAM) 50 MG tablet Take 50 mg by mouth every 6 (six) hours as needed (for pain).        No current facility-administered medications for this visit.    No Known Allergies  Past Medical History  Diagnosis Date  . Chest pain   . Hyperlipidemia   . Hypertension   . Gout     "maybe twice in my life"  . GERD (gastroesophageal reflux disease)   . Myocardial infarction     "/Dr.  Katharina Caper I've had one between 2014-2015) (06/21/2013)  . Asthma     "not sure if this is true or not" (06/21/2013)  . OSA on CPAP   . Type II diabetes mellitus   . Headache(784.0)     "weekly for the last 3-4 months" (06/21/2013)  . Arthritis     "lower back; right shoulder; left thumb; joints" (06/21/2013)  . Osteoarthritis   . CAD (coronary artery disease) May 2015    s/p PTCA/DES x 3 to mid LAD, mod non-ob disease in Cx and RCA May 2015    Past Surgical History  Procedure Laterality Date  . Cardiovascular stress test  06/10/2008    EF 68%  . Carpal tunnel release Bilateral   . Excision morton's neuroma Left   . Tonsillectomy    . Eye surgery Left     "removed film over my eye"  . Total shoulder arthroplasty  03/01/2012    Procedure: TOTAL SHOULDER ARTHROPLASTY;  Surgeon: Marin Shutter, MD;  Location: [facility];  Service: Orthopedics;  Laterality: Right;  . Cardiac catheterization  1980's    "once"  . Coronary angioplasty with stent placement  06/21/2013    "3"  . Hernia repair Left   . Umbilical hernia repair    . Joint replacement Left 03/2013    "thumb"    History  Smoking status  . Never Smoker   Smokeless tobacco  . Never Used    History  Alcohol Use  . Yes    Comment: "used to drink a little beer occasionally; never was a drinker; nothing now"    Family History  Problem Relation Age of Onset  . Diabetes type II Mother   . Heart attack Father   . Coronary artery disease Cousin     Review of Systems: The review of systems is per the HPI.  All other systems were reviewed and are negative.  Physical Exam: BP 130/80  Pulse 60  Ht 5\' 9"  (1.753 m)  Wt 206 lb (93.441 kg)  BMI 30.41 kg/m2  SpO2 97% Patient is very pleasant and in no acute distress. Skin is warm and dry. Color is normal.  HEENT is unremarkable. Normocephalic/atraumatic. PERRL. Sclera are nonicteric. Neck is supple. No masses. No JVD. Lungs are clear. Cardiac  exam shows a regular rate and rhythm. Abdomen  is soft. Extremities are without edema. Gait and ROM are intact. No gross neurologic deficits noted.  LABORATORY DATA: EKG shows sinus rhythm. He has very subtle changes - reviewed with Dr. Mare Ferrari in the office - felt to overall be unchanged from tracing yesterday.  Lab Results  Component Value Date   WBC 5.3 06/24/2013   HGB 14.4 06/24/2013   HCT 41.0 06/24/2013   PLT 170 06/24/2013   GLUCOSE 235* 06/24/2013   CHOL 123 04/11/2011   TRIG 119.0 04/11/2011   HDL 41.60 04/11/2011   LDLCALC 58 04/11/2011   ALT 26 04/11/2011   AST 17 04/11/2011   NA 140 06/24/2013   K 3.9 06/24/2013   CL 102 06/24/2013   CREATININE 0.89 06/24/2013   BUN 21 06/24/2013   CO2 22 06/24/2013   INR 1.1* 06/20/2013   Procedure Performed:  1. Left Heart Catheterization 2. Selective Coronary Angiography 3. Left ventricular angiogram 4. PTCA/DES x 3 mid LAD Operator: Lauree Chandler, MD  Arterial access site: Right radial artery.  Indication: 68 yo male with history of DM, HTN, HLD with recent unstable angina (class III), abnormal stress myoview with anterior wall ischemia.  Procedure Details:  The risks, benefits, complications, treatment options, and expected outcomes were discussed with the patient. The patient and/or family concurred with the proposed plan, giving informed consent. The patient was brought to the cath lab after IV hydration was begun and oral premedication was given. The patient was further sedated with Versed and Fentanyl. The right wrist was assessed with an Allens test which was positive. The right wrist was prepped and draped in a sterile fashion. 1% lidocaine was used for local anesthesia. Using the modified Seldinger access technique, a 5/6 French sheath was placed in the right radial artery. 3 mg Verapamil was given through the sheath. 5000 units IV heparin was given. Standard diagnostic catheters were used to perform selective coronary angiography. A pigtail catheter was used to perform a left  ventricular angiogram. He was found to have severe stenosis in the calcified, diffusely diseased mid LAD. I elected to proceed to PCI of the LAD.  PCI Note: The left main was engaged with a XB LAD 3.5 guiding catheter. He was given Plavix 600 mg po x 1. He was given additional IV heparin and ACT was over 250. Total Heparin IV during case was 14,000 units. I then passed a Cougar IC wire down the LAD. I could not pass a balloon beyond the mid vessel so I used another Cougar IC wire as a buddy wire for support. I was able to pass a 1.5 x 15 mm balloon into the distal vessel and performed several balloon inflations. I then passed a 2.0 x 20 mm balloon into the mid vessel and performed one inflation. I could not pass a stent down the vessel and I had poor guide support. I changed out for an EBU 3.5 guide and passed another Cougar IC wire and a Mailman wire down the LAD. I then used a 2.0 x 12 mm balloon to pre-dilate the entire mid vessel. I was then able to deliver stents. I placed a 2.25 x 18 mm Resolute Integrity DES, followed by an overlapping 2.25 x 18 mm Resolute Integrity DES in the mid vessel and then a 2.25 x 22 mm Resolute Integrity DES in the most proximal segment of the mid vessel. The stents were post-dilated with a 2.25 x 20 mm [place] balloon x  3. The stenosis was taken from 99% down to 0%.  The sheath was removed from the right radial artery and a Terumo hemostasis band was applied at the arteriotomy site on the right wrist. There were no immediate complications. The patient was taken to the recovery area in stable condition.  Hemodynamic Findings:  Central aortic pressure: 142/72  Left ventricular pressure: 143/10/19  Angiographic Findings:  Left main: Ostial 30-40% stenosis.  Left Anterior Descending Artery: Moderate caliber vessel that courses to the apex. There is an ostial 40% calcified stenosis. The proximal vessel has diffuse 20% stenosis. The mid vessel is moderately calcified with diffuse 99%  stenosis. The distal vessel has diffuse 50% stenosis. The entire vessel is diffusely diseased. The first diagonal branch is small in caliber with proximal 50% stenosis.  Circumflex Artery: Moderate caliber vessel with 20% proximal stenosis, 60% ostium of continuation of the AV groove Circumflex beyond the takeoff of the first OM branch. The first OM branch has a focal 50% stenosis.  Right Coronary Artery: Large caliber dominant calcified vessel with proximal 20% stenosis, mid 40% stenosis, distal 30% stenosis. The ostium of the posterolateral branch has 40% stenosis. The ostium of the PDA has 50% stenosis.  Left Ventricular Angiogram: LVEF=55-60%  Impression:  1. Unstable angina  2. Severe stenosis in the diffusely diseased mid LAD, now s/p successful PTCA/DES x 3 mid LAD  3. Moderate non-obstructive disease in the Circumflex and RCA  4. Normal LV systolic function  Recommendations: He will need dual anti-platelet therapy with ASA and Plavix for at least one year. Given his diabetes and diffuse CAD, would recommend dual antiplatelet therapy for lifetime. Home in am if stable.  Complications: None. The patient tolerated the procedure well.    Assessment / Plan: 1. CAD with recent history of unstable angina - abnormal Myoview followed by cath with DES x 3 to the LAD - has moderate nonobstructive disease otherwise - has had recurrent chest pain - using NTG - discussed with Dr. Angelena Form who is here in the office today - he agrees with plan to recath - notes that Mr. Feldpausch has lots of CAD - in a diffuse manner/diabetic vessels. He has not tolerated Imdur in the past. Will try Norvasc 5 mg a day.   2. HTN - adding Norvasc today.   3. HLD - on statin  4. DM - will hold his Metformin.  He is to limit his activities. Plan for cardiac cath tomorrow am. Plan on checking P2Y12 testing at the hospital as well. If he has recurrent pain and is using NTG - he is advised to call 911 and come back to the [facility] ER.    I will see him in a week.   Patient is agreeable to this plan and will call if any problems develop in the interim.   Burtis Junes, RN, [place] [address] [place], [place]  [phone]

## 2013-06-25 NOTE — H&P (Signed)
History and Physical   Patient ID: Hunter Mcbride MRN: [id number], DOB/AGE: August 02, 1945   Admit date: 06/25/2013 Date of Consult: 06/25/2013   Primary Physician: Jerlyn Ly, MD Primary Cardiologist: Dr. Angelena Form  HPI: Zeke Aker is a 68 y.o. male with PMHx of NIDDM-Type 2, OSA on CPAP, HTN, HLD, GERD and chronic IHD (w/ preserved LVEF=59% per 06/20/13 nuclear study) who is s/p PTCA with DES x 3 to mid/distal LAD on 06/21/13 by Dr. Angelena Form for CCS Class III angina and +nuclear stress test at that time (2.25 x 18 mm Resolute Integrity DES dLAD, followed by an overlapping 2.25 x 18 mm Resolute Integrity DES in the mid vessel and then a 2.25 x 22 mm Resolute Integrity DES in the most proximal segment of the mid vessel. The stents were post-dilated with a 2.25 x 20 mm [place] balloon x 3. The stenosis was taken from 99% down to 0%).  Of note, his coronary angiogram was also notable for diffuse moderate non-obstructive ASCAD within the LAD branches, RCA and LCX system.  Additionally, an oLMCA 30-40% stenosis was noted.  The patient was discharged from [facility] on 06/22/13 on DAPT with aspirin and Plavix.  He has been compliant.  However, his chest pain syndrome never truly resolved and he was in fact in the [facility] ED last night with chest pain, ruled-out for ACS, without acute ECG changes and was discharged home from the ED with plans to be followed in [place] [facility] today (06/25/13).  Thus, this afternoon he was seen in Cardiology clinic by [facility] practice profession ([facility]) working with Dr. Angelena Form.  The patient was chest pain free at the time of the clinic appointment and the decision was made to send him home from clinic and to have him return for outpatient same day cath here at [facility] on 06/26/13 to be done by Dr. Pernell Dupre.  However, after getting home from the Cardiology clinic, the patient began having 10/10 chest pressure/pain at ~5pm described as achey radiating to the back and down both arms accompanied  with diaphoresis and mild dyspnea.  He therefore arrived to the [facility] ED around 7pm and initial vitals were notable for sinus bradycardia at 57 bpm and normotensive blood pressure with 9/10 chest discomfort as described above.  He subsequently had a rise in his BP to 160/100 mmHg in the midst of pain.  In the ED his 12-lead ECG showed SR with inferior infarct and otherwise no ST-T changes compared to his discharge ECG from 06/22/13.  His initial troponin obtained at 7:10pm (roughly 2 hrs into the pain onset) was 0.00.  A second repeat Troponin obtained 2 hours later was <0.30.  Other labs in the ED was notable for blood glucose of 250, normal HCO3- and normal renal function, no leukocytosis, normal electrolytes.  He was admitted to stepdown with concern for UA.   Problem List: Past Medical History  Diagnosis Date  . Chest pain   . Hyperlipidemia   . Hypertension   . Gout     "maybe twice in my life"  . GERD (gastroesophageal reflux disease)   . Myocardial infarction     "/Dr. Katharina Caper I've had one between 2014-2015) (06/21/2013)  . Asthma     "not sure if this is true or not" (06/21/2013)  . OSA on CPAP   . Type II diabetes mellitus   . Headache(784.0)     "weekly for the last 3-4 months" (06/21/2013)  . Arthritis     "lower back;  right shoulder; left thumb; joints" (06/21/2013)  . Osteoarthritis   . CAD (coronary artery disease) May 2015    s/p PTCA/DES x 3 to mid LAD, mod non-ob disease in Cx and RCA May 2015    Past Surgical History  Procedure Laterality Date  . Cardiovascular stress test  06/10/2008    EF 68%  . Carpal tunnel release Bilateral   . Excision morton's neuroma Left   . Tonsillectomy    . Eye surgery Left     "removed film over my eye"  . Total shoulder arthroplasty  03/01/2012    Procedure: TOTAL SHOULDER ARTHROPLASTY;  Surgeon: Marin Shutter, MD;  Location: [place];  Service: Orthopedics;  Laterality: Right;  . Cardiac catheterization  1980's    "once"  . Coronary angioplasty  with stent placement  06/21/2013    "3"  . Hernia repair Left   . Umbilical hernia repair    . Joint replacement Left 03/2013    "thumb"     Allergies: No Known Allergies  Home Medications: Prior to Admission medications   Medication Sig Start Date End Date Taking? Authorizing Provider  acetaminophen (TYLENOL ARTHRITIS PAIN) 650 MG CR tablet Take 1,300 mg by mouth 2 (two) times daily as needed. For pain   Yes Historical Provider, MD  amLODipine (NORVASC) 5 MG tablet Take 1 tablet (5 mg total) by mouth daily. 06/25/13  Yes Burtis Junes, NP  aspirin 81 MG tablet Take 81 mg by mouth at bedtime.    Yes Historical Provider, MD  atenolol (TENORMIN) 50 MG tablet Take 1 tablet (50 mg total) by mouth daily. 06/20/13  Yes Josue Hector, MD  calcium carbonate (TUMS - DOSED IN MG ELEMENTAL CALCIUM) 500 MG chewable tablet Chew 2 tablets by mouth daily as needed for indigestion or heartburn.   Yes Historical Provider, MD  chlorpheniramine-HYDROcodone (TUSSIONEX) 10-8 MG/5ML LQCR Take 5 mLs by mouth every 12 (twelve) hours as needed for cough.   Yes Historical Provider, MD  cimetidine (TAGAMET) 200 MG tablet Take 200 mg by mouth daily as needed (for heartburn).   Yes Historical Provider, MD  clopidogrel (PLAVIX) 75 MG tablet Take 1 tablet (75 mg total) by mouth daily with breakfast. 06/22/13  Yes Brooke O Edmisten, PA-C  gabapentin (NEURONTIN) 300 MG capsule Take 300 mg by mouth at bedtime.     Yes Historical Provider, MD  hydrochlorothiazide (HYDRODIURIL) 25 MG tablet Take 25 mg by mouth daily.  05/28/13  Yes Historical Provider, MD  irbesartan (AVAPRO) 300 MG tablet Take 150 mg by mouth daily.    Yes Historical Provider, MD  LORazepam (ATIVAN) 0.5 MG tablet Take 0.5 mg by mouth as needed for anxiety.  06/01/13  Yes Historical Provider, MD  metFORMIN (GLUCOPHAGE-XR) 500 MG 24 hr tablet Take 1 tablet (500 mg total) by mouth 2 (two) times daily. HOLD for 2 days. Restart Monday 06/24/2013. 06/22/13  Yes Brooke O  Edmisten, PA-C  Multiple Vitamin (MULTIVITAMIN WITH MINERALS) TABS tablet Take 1 tablet by mouth daily.   Yes Historical Provider, MD  nitroGLYCERIN (NITROSTAT) 0.4 MG SL tablet Place 1 tablet (0.4 mg total) under the tongue every 5 (five) minutes as needed. For chest pain 06/03/13  Yes Thayer Headings, MD  ONGLYZA 5 MG TABS tablet Take 5 mg by mouth daily.  05/24/13  Yes Historical Provider, MD  pantoprazole (PROTONIX) 40 MG tablet Take 40 mg by mouth daily.   Yes Historical Provider, MD  rosuvastatin (CRESTOR) 20 MG tablet  Take 10 mg by mouth every other day.   Yes Historical Provider, MD  traMADol (ULTRAM) 50 MG tablet Take 50 mg by mouth every 6 (six) hours as needed (for pain).  04/22/13  Yes Historical Provider, MD    Inpatient Medications:  . insulin aspart  4 Units Subcutaneous Once  .  morphine injection  4 mg Intravenous Once    (Not in a hospital admission)  Family History  Problem Relation Age of Onset  . Diabetes type II Mother   . Heart attack Father   . Coronary artery disease Cousin   . Heart disease Brother      History   Social History  . Marital Status: Married    Spouse Name: N/A    Number of Children: N/A  . Years of Education: N/A   Occupational History  . Not on file.   Social History Main Topics  . Smoking status: Never Smoker   . Smokeless tobacco: Never Used  . Alcohol Use: Yes     Comment: "used to drink a little beer occasionally; never was a drinker; nothing now"  . Drug Use: No  . Sexual Activity: Yes   Other Topics Concern  . Not on file   Social History Narrative  . No narrative on file     Review of Systems: All other systems reviewed and are otherwise negative except as noted above in HPI section.  Physical Exam: Blood pressure 130/77, pulse 64, temperature 97.5 F (36.4 C), temperature source Oral, resp. rate 18, SpO2 100.00%. General: Well developed, well nourished, in mild distress/discomfort due to chest pain. Head:  Normocephalic, atraumatic, sclera non-icteric, no xanthomas, nares are without discharge.  Neck: Negative for carotid bruits. JVD not elevated. Lungs: Clear bilaterally to auscultation without wheezes, rales, or rhonchi. Breathing is unlabored. Heart: RRR with S1 S2. No murmurs, rubs, or gallops appreciated. Abdomen: Soft, non-tender, non-distended with normoactive bowel sounds. No hepatomegaly. No rebound/guarding. No obvious abdominal masses. Msk:  Strength and tone appears normal for age. Extremities: No clubbing, cyanosis or edema.  Distal pedal pulses are 2+ and equal bilaterally. Neuro: Alert and oriented X 3. Moves all extremities spontaneously. Psych:  Responds to questions appropriately with a normal affect.  Labs: Recent Labs     06/25/13  1500  06/25/13  1902  WBC  5.4  5.5  HGB  14.2  14.4  HCT  41.5  40.7  MCV  88.5  85.3  PLT  204.0  188   No results found for this basename: VITAMINB12, FOLATE, FERRITIN, TIBC, IRON, RETICCTPCT,  in the last 72 hours No results found for this basename: DDIMER,  in the last 72 hours  Recent Labs Lab 06/24/13 1943 06/25/13 1500 06/25/13 1902  NA 140 137 137  K 3.9 3.8 3.8  CL 102 103 99  CO2 22 26 22   BUN 21 25* 23  CREATININE 0.89 1.0 0.92  CALCIUM 9.5 9.5 9.9  GLUCOSE 235* 295* 250*   No results found for this basename: HGBA1C,  in the last 72 hours Recent Labs     06/24/13  2154  TROPONINI  <0.30   No components found with this basename: POCBNP,  No results found for this basename: CHOL, HDL, LDLCALC, TRIG, CHOLHDL, LDLDIRECT,  in the last 72 hours No results found for this basename: TSH, T4TOTAL, FREET3, T3FREE, THYROIDAB,  in the last 72 hours  Radiology/Studies: Dg Chest Port 1 View  06/25/2013   CLINICAL DATA:  Chest pain  EXAM: PORTABLE CHEST - 1 VIEW  COMPARISON:  02/27/2012  FINDINGS: Lungs are clear.  No pleural effusion or pneumothorax.  The heart is normal in size.  Right shoulder arthroplasty.  IMPRESSION: No  evidence of acute cardiopulmonary disease.   Electronically Signed   By: Julian Hy M.D.   On: 06/25/2013 20:35    SERIAL ECG's WERE PERFORMED IN ED DURING ACTIVE CP:  SR, normal conduction intervals, inferior infarct noted on prior ECG's as well, no new ST-T changes.  ASSESSMENT:   68 yo WM with PMHx of NIDDM-Type 2, OSA on CPAP, HTN, HLD, GERD and chronic IHD (w/ preserved LVEF=59% per 06/20/13 nuclear study) who is s/p PTCA with DES x 3 to mid/distal LAD on 06/21/13 by Dr. Angelena Form for CCS Class III angina and +nuclear stress test at that time.  He has been compliant with his DAPT.  He now p/w concern for UA and was actually scheduled to undergo same day/outpatient cardiac catheterization on 06/26/13 but in the interim he presented with chest pain as outlined above.  PLAN:  1-Admit to Stepdown unit, service of on-call Cardiologist, Dr. Ena Dawley. 2-UA:  UFH gtt (per pharmacy dosing; ACS protocol), NTG gtt, IV Morphine q4 hrs PRN, aspirin + Plavix to continue per home regimen, cycle cardiac troponin, high dose statin, continue home dose BB and ARB.  Obtain Echocardiogram in AM as patient has not had a complete echo or LV function assessed fully.  Keep NPO for cath/poss PCI in AM; cath lab staff aware. 3-Obtain CTA Chest to rule-out Ao dissection.  BP is equal in both arms but pain is radiating to back and although this is the same pain he had on 06/20/13 when he presented prior to his PCI, I would still like to rule-out dissection/aortopathy as his second troponin is also negative yet his pain is marked and impressive.  Can post-hydrate with NS to prepare for cath in AM once CTA chest is completed.  4-NIDDM:  Glucose is elevated ~250.  Patient did not take his metformin today as he was planned for cath on 5/13 as an outpatient.  We will begin SSI and he was dosed subQ insulin in ED as well for acute coverage.  Check fingerstick glucose q4 hrs.  HbA1C added-on. 5-HTN:  Continue home regimen, pain  control and provide PRN IV Hydralazine if needed for SBP >160 mmHg or DBP >100 mmHg. 6-HLD:  High dose statin for ACS to replace his low dow home regimen. 7-OSA:  CPAP if needed at bedtime. 8-Anxiety:  Ativan q6 hrs PRN anxiety as per home regimen. 9-DVT PPx:  Addressed with ACS UFH gtt. 10-GERD:  Replace omeprazole/cimetidine with famotidine 20mg  daily for now.  Code Status:  FULL CODE.  Signed, Charlton Haws, MD [facility] [place] Physician in Cardiology 06/25/2013, 9:04 PM

## 2013-06-25 NOTE — Telephone Encounter (Signed)
Primary cardiologist is Dr. Acie Fredrickson

## 2013-06-25 NOTE — ED Notes (Signed)
Pt reports having left side chest pains, radiates into back and initially radiated down both arms. Pt took 3 nitro pta which helped relieve some of the pain. Reports having 3 stents placed on Friday. Was seen at [facility] today and has cath scheduled for morning.

## 2013-06-25 NOTE — Telephone Encounter (Signed)
New Message:  Pt's wife is calling to set up her husband a f/u appt... Pt was recently seen in the hospital.. She is requesting an appt today or tomorrow. Pt's discharge instructions state Follow up with MCALHANY,CHRISTOPHER, MD. Call in 1 day... Pt's wife is requesting a call back from the nurse.

## 2013-06-25 NOTE — Patient Instructions (Addendum)
I am adding Norvasc 5 mg to take daily  Stay on your current medicines  We will arrange for a repeat heart catheterization tomorrow with Dr. Pernell Dupre  You are scheduled for a cardiac catheterization on Wednesday, May 13th with Dr. Tamala Julian or associate.  Go to [facility] Floor Short Stay on Wednesday, May 13th at 9:30am.  Enter thru the [employer] entrance A No food or drink after midnight tonight. You may take your medications with a sip of water on the day of your procedure.   No more Metformin  See me in one week  If you have more trouble/problems tonight or use NTG - go to the ER at [facility]  Call the [place] office at [phone] if you have any questions, problems or concerns.

## 2013-06-26 ENCOUNTER — Encounter (HOSPITAL_COMMUNITY)
Admission: EM | Disposition: A | Discharge status: Home / Self Care | Payer: Self-pay | Source: Home / Self Care | Attending: Cardiology

## 2013-06-26 ENCOUNTER — Inpatient Hospital Stay (HOSPITAL_COMMUNITY): Payer: Medicare Other

## 2013-06-26 ENCOUNTER — Ambulatory Visit (HOSPITAL_COMMUNITY): Admit: 2013-06-26 | Payer: Self-pay | Admitting: Interventional Cardiology

## 2013-06-26 DIAGNOSIS — I200 Unstable angina: Secondary | ICD-10-CM | POA: Diagnosis not present

## 2013-06-26 DIAGNOSIS — I517 Cardiomegaly: Secondary | ICD-10-CM

## 2013-06-26 DIAGNOSIS — G4733 Obstructive sleep apnea (adult) (pediatric): Secondary | ICD-10-CM | POA: Diagnosis not present

## 2013-06-26 DIAGNOSIS — I2510 Atherosclerotic heart disease of native coronary artery without angina pectoris: Secondary | ICD-10-CM

## 2013-06-26 DIAGNOSIS — R079 Chest pain, unspecified: Secondary | ICD-10-CM | POA: Diagnosis not present

## 2013-06-26 DIAGNOSIS — E119 Type 2 diabetes mellitus without complications: Secondary | ICD-10-CM | POA: Diagnosis not present

## 2013-06-26 HISTORY — PX: LEFT HEART CATHETERIZATION WITH CORONARY ANGIOGRAM: SHX5451

## 2013-06-26 HISTORY — PX: FRACTIONAL FLOW RESERVE WIRE: SHX5839

## 2013-06-26 LAB — COMPREHENSIVE METABOLIC PANEL
ALK PHOS: 95 U/L (ref 39–117)
ALT: 27 U/L (ref 0–53)
AST: 18 U/L (ref 0–37)
Albumin: 3.4 g/dL — ABNORMAL LOW (ref 3.5–5.2)
BUN: 19 mg/dL (ref 6–23)
CALCIUM: 9.1 mg/dL (ref 8.4–10.5)
CO2: 21 mEq/L (ref 19–32)
Chloride: 104 mEq/L (ref 96–112)
Creatinine, Ser: 0.82 mg/dL (ref 0.50–1.35)
GFR calc non Af Amer: >90 mL/min (ref >90)
GLUCOSE: 131 mg/dL — AB (ref 70–99)
POTASSIUM: 3.3 meq/L — AB (ref 3.7–5.3)
SODIUM: 139 meq/L (ref 137–147)
Total Bilirubin: 0.6 mg/dL (ref 0.3–1.2)
Total Protein: 6.1 g/dL (ref 6.0–8.3)

## 2013-06-26 LAB — GLUCOSE, CAPILLARY
Glucose-Capillary: 101 mg/dL — ABNORMAL HIGH (ref 70–99)
Glucose-Capillary: 181 mg/dL — ABNORMAL HIGH (ref 70–99)
Glucose-Capillary: 135 mg/dL — ABNORMAL HIGH (ref 70–99)
Glucose-Capillary: 252 mg/dL — ABNORMAL HIGH (ref 70–99)
Glucose-Capillary: 183 mg/dL — ABNORMAL HIGH (ref 70–99)
Glucose-Capillary: 169 mg/dL — ABNORMAL HIGH (ref 70–99)

## 2013-06-26 LAB — CBC WITH DIFFERENTIAL/PLATELET
BASOS ABS: 0 10*3/uL (ref 0.0–0.1)
Basophils Relative: 0 % (ref 0–1)
Eosinophils Absolute: 0.1 10*3/uL (ref 0.0–0.7)
Eosinophils Relative: 1 % (ref 0–5)
HCT: 35.5 % — ABNORMAL LOW (ref 39.0–52.0)
Hemoglobin: 12.5 g/dL — ABNORMAL LOW (ref 13.0–17.0)
LYMPHS ABS: 1.6 10*3/uL (ref 0.7–4.0)
LYMPHS PCT: 35 % (ref 12–46)
MCH: 29.8 pg (ref 26.0–34.0)
MCHC: 35.2 g/dL (ref 30.0–36.0)
MCV: 84.5 fL (ref 78.0–100.0)
Monocytes Absolute: 0.4 10*3/uL (ref 0.1–1.0)
Monocytes Relative: 8 % (ref 3–12)
NEUTROS PCT: 56 % (ref 43–77)
Neutro Abs: 2.5 10*3/uL (ref 1.7–7.7)
PLATELETS: 152 10*3/uL (ref 150–400)
RBC: 4.2 MIL/uL — ABNORMAL LOW (ref 4.22–5.81)
RDW: 12.5 % (ref 11.5–15.5)
WBC: 4.5 10*3/uL (ref 4.0–10.5)

## 2013-06-26 LAB — MAGNESIUM: Magnesium: 1.9 mg/dL (ref 1.5–2.5)

## 2013-06-26 LAB — PLATELET INHIBITION P2Y12: Platelet Function  P2Y12: 59 [PRU] — ABNORMAL LOW (ref 194–418)

## 2013-06-26 LAB — POCT ACTIVATED CLOTTING TIME: Activated Clotting Time: 265 seconds

## 2013-06-26 LAB — TROPONIN I

## 2013-06-26 LAB — MRSA PCR SCREENING: MRSA by PCR: NEGATIVE

## 2013-06-26 LAB — HEPARIN LEVEL (UNFRACTIONATED): Heparin Unfractionated: 0.45 IU/mL (ref 0.30–0.70)

## 2013-06-26 LAB — HEMOGLOBIN A1C
Hgb A1c MFr Bld: 8.4 % — ABNORMAL HIGH (ref <5.7)
Mean Plasma Glucose: 194 mg/dL — ABNORMAL HIGH (ref <117)

## 2013-06-26 SURGERY — LEFT HEART CATHETERIZATION WITH CORONARY ANGIOGRAM
Anesthesia: LOCAL

## 2013-06-26 MED ORDER — VERAPAMIL HCL 2.5 MG/ML IV SOLN
INTRAVENOUS | Status: AC
  Filled 2013-06-26: qty 2

## 2013-06-26 MED ORDER — LIDOCAINE HCL (PF) 1 % IJ SOLN
INTRAMUSCULAR | Status: AC
  Filled 2013-06-26: qty 30

## 2013-06-26 MED ORDER — IOHEXOL 350 MG/ML SOLN
100.0000 mL | Freq: Once | INTRAVENOUS | Status: AC | PRN
  Administered 2013-06-26: 100 mL via INTRAVENOUS

## 2013-06-26 MED ORDER — MIDAZOLAM HCL 2 MG/2ML IJ SOLN
INTRAMUSCULAR | Status: AC
  Filled 2013-06-26: qty 2

## 2013-06-26 MED ORDER — HEPARIN (PORCINE) IN NACL 2-0.9 UNIT/ML-% IJ SOLN
INTRAMUSCULAR | Status: AC
  Filled 2013-06-26: qty 1000

## 2013-06-26 MED ORDER — POTASSIUM CHLORIDE CRYS ER 20 MEQ PO TBCR
20.0000 meq | EXTENDED_RELEASE_TABLET | Freq: Once | ORAL | Status: AC
  Administered 2013-06-26: 20 meq via ORAL
  Filled 2013-06-26: qty 1

## 2013-06-26 MED ORDER — DIAZEPAM 5 MG PO TABS
ORAL_TABLET | ORAL | Status: AC
  Administered 2013-06-26: 5 mg
  Filled 2013-06-26: qty 1

## 2013-06-26 MED ORDER — SODIUM CHLORIDE 0.9 % IV SOLN: INTRAVENOUS | Status: AC

## 2013-06-26 MED ORDER — ADENOSINE 12 MG/4ML IV SOLN
16.0000 mL | Freq: Once | INTRAVENOUS | Status: DC
  Filled 2013-06-26: qty 16

## 2013-06-26 MED ORDER — ONDANSETRON HCL 4 MG/2ML IJ SOLN
4.0000 mg | Freq: Four times a day (QID) | INTRAMUSCULAR | Status: DC | PRN
  Administered 2013-06-26: 4 mg via INTRAVENOUS
  Filled 2013-06-26: qty 2

## 2013-06-26 MED ORDER — RANOLAZINE ER 500 MG PO TB12
500.0000 mg | ORAL_TABLET | Freq: Two times a day (BID) | ORAL | Status: DC
  Administered 2013-06-26 (×2): 500 mg via ORAL
  Filled 2013-06-26 (×4): qty 1

## 2013-06-26 MED ORDER — FENTANYL CITRATE 0.05 MG/ML IJ SOLN
INTRAMUSCULAR | Status: AC
  Filled 2013-06-26: qty 2

## 2013-06-26 MED ORDER — INSULIN ASPART 100 UNIT/ML ~~LOC~~ SOLN
0.0000 [IU] | Freq: Three times a day (TID) | SUBCUTANEOUS | Status: DC
  Administered 2013-06-27: 3 [IU] via SUBCUTANEOUS

## 2013-06-26 MED ORDER — NITROGLYCERIN 0.2 MG/ML ON CALL CATH LAB
INTRAVENOUS | Status: AC
  Filled 2013-06-26: qty 1

## 2013-06-26 MED ORDER — INSULIN ASPART 100 UNIT/ML ~~LOC~~ SOLN
0.0000 [IU] | Freq: Every day | SUBCUTANEOUS | Status: DC
Start: 2013-06-26 — End: 2013-06-27

## 2013-06-26 MED ORDER — ASPIRIN 81 MG PO CHEW
CHEWABLE_TABLET | ORAL | Status: AC
  Filled 2013-06-26: qty 1

## 2013-06-26 MED ORDER — SODIUM CHLORIDE 0.9 % IV SOLN
INTRAVENOUS | Status: DC
Start: 2013-06-26 — End: 2013-06-26

## 2013-06-26 NOTE — Interval H&P Note (Signed)
Cath Lab Visit (complete for each Cath Lab visit)  Clinical Evaluation Leading to the Procedure:   ACS: yes  Non-ACS:    Anginal Classification: CCS IV  Anti-ischemic medical therapy: Maximal Therapy (2 or more classes of medications)  Non-Invasive Test Results: No non-invasive testing performed  Prior CABG: No previous CABG      History and Physical Interval Note:  06/26/2013 8:36 AM  Hunter Mcbride  has presented today for surgery, with the diagnosis of Unstable Angina  The various methods of treatment have been discussed with the patient and family. After consideration of risks, benefits and other options for treatment, the patient has consented to  Procedure(s): LEFT HEART CATHETERIZATION WITH CORONARY ANGIOGRAM (N/A) as a surgical intervention .  The patient's history has been reviewed, patient examined, no change in status, stable for surgery.  I have reviewed the patient's chart and labs.  Questions were answered to the patient's satisfaction.     Hunter Mcbride

## 2013-06-26 NOTE — Progress Notes (Signed)
  Echocardiogram 2D Echocardiogram has been performed.  Basilia Jumbo 06/26/2013, 12:56 PM

## 2013-06-26 NOTE — CV Procedure (Signed)
Cardiac Catheterization Procedure Note  Name: Hunter Mcbride MRN: [id number] DOB: 03-13-1945  Procedure: Left Heart Cath, Selective Coronary Angiography, LV angiography, pressure wire interrogation of the left main coronary artery  Indication: Unstable angina with recent stenting of the LAD  Medications:  Sedation:  2 mg IV Versed, 75 mcg IV Fentanyl  Contrast:  90 Omnipaque  Procedural Details: The right wrist was prepped, draped, and anesthetized with 1% lidocaine. Using the modified Seldinger technique, a 5 French Slender sheath was introduced into the right radial artery. 3 mg of verapamil was administered through the sheath, weight-based unfractionated heparin was administered intravenously. A Jackie catheter was used for selective coronary angiography and left ventriculography. Catheter exchanges were performed over an exchange length guidewire. There were no immediate procedural complications.  Procedural Findings:  Hemodynamics: AO:  135/5 her6   mmHg LV:  131/7    mmHg LVEDP: 16  mmHg  Coronary angiography: Coronary dominance: Right   Left Main:  Mildly calcified with 30-40% ostial stenosis   Left Anterior Descending (LAD):  Heavily calcified with minor irregularities proximally. Overlapped stents are patent in the mid and distal area with no significant restenosis. The second septal perforator is jailed by the stent with 80-90% ostial stenosis but normal flow. In the distal LAD close to the apex, there is a 80% stenosis   1st diagonal (D1):  Small in size with 70% ostial stenosis.   2nd diagonal (D2):  This is a 1.5 mm vessel although it does extend distally to a large portion of the lateral wall. There is 90% ostial and proximal stenosis.   3rd diagonal (D3):  Very small in size.   Circumflex (LCx):  Normal in size, mildly calcified and nondominant. There is 40-50% stenosis in the midsegment after the origin of OM1.   1st obtuse marginal:  Large in size with 50-60%  proximal stenosis.   2nd obtuse marginal:   very small in size   3rd obtuse marginal:  Very small in size.   [place]amus Intermedius:  I very small in size with 95% ostial stenosis.   Right Coronary Artery: Large in size and dominant. I the vessel is moderately calcified. There is diffuse 20% disease in the proximal and midsegment.   Posterior descending artery: That a 60% ostial stenosis.   Posterior AV segment: Was 50-60% ostial stenosis   Posterolateral branchs:  Minor irregularities.  Left ventriculography: Left ventricular systolic function is normal , LVEF is estimated at 55  %,    pressure wire interrogation  Note:  Following the diagnostic procedure, the decision was made to proceed with  pressure wire interrogation of the left main coronary artery.   ACT was 256 with heparin. Once a therapeutic ACT was achieved, a 6 [place] JL 3.5  guide catheter was inserted.  A volcano prime wire was used in the standard fashion. This was advanced into OM 1. The patient was started on intravenous adenosine at 140 mcg per kilogram per minute. Continuous recording of pressure was performed. FFR ratio was 0.93 with a resting pressure of 0.96. This indicated no physiologic significance.  The wire and guiding catheters were removed.  The patient tolerated the procedure well. There were no immediate procedural complications. A TR band was used for radial hemostasis. The patient was transferred to the post catheterization recovery area for further monitoring.     Final Conclusions:  1. Diffuse borderline significant three-vessel coronary artery disease with significant small branch disease. The stents in the LAD are patent.  The disease in the RCA and left circumflex is unchanged. The patient might be having angina from the jailed second septal perforator. He is also probably ischemic in the second diagonal territory and other small branches as well as distal LAD. However, these branches are 1.5 mm or less in  diameter.  2. Normal LV systolic function and mildly elevated left ventricular end-diastolic pressure. 3. Mild ostial left main stenosis which was not significant by pressure wire interrogation.  Recommendations:  Recommend optimizing medical therapy. He did not tolerate Quimby-acting nitroglycerin due to headache. I added Ranexa. Monitor today and ambulate. Possible discharge in the morning.  [name] MD, [facility] 06/26/2013, 9:44 AM

## 2013-06-26 NOTE — Care Management Note (Signed)
    Page 1 of 1   06/26/2013     9:10:36 AM CARE MANAGEMENT NOTE 06/26/2013  Patient:  ARCHER, MOIST   Account Number:  [id number]  Date Initiated:  06/26/2013  Documentation initiated by:  Elissa Hefty  Subjective/Objective Assessment:   adm w angina     Action/Plan:   lives w wife, pcp dr mark perini   Anticipated DC Date:     Anticipated DC Plan:           Choice offered to / List presented to:             Status of service:   Medicare Important Message given?   (If response is "NO", the following Medicare IM given date fields will be blank) Date Medicare IM given:   Date Additional Medicare IM given:    Discharge Disposition:    Per UR Regulation:  Reviewed for med. necessity/level of care/duration of stay  If discussed at [place] of Stay Meetings, dates discussed:    Comments:

## 2013-06-26 NOTE — Progress Notes (Signed)
TR BAND REMOVAL  LOCATION:    right radial  DEFLATED PER PROTOCOL:    yes  TIME BAND OFF / DRESSING APPLIED:    1200   SITE UPON ARRIVAL:    Level 0  SITE AFTER BAND REMOVAL:    Level 0  REVERSE ALLEN'S TEST:     positive  CIRCULATION SENSATION AND MOVEMENT:    Within Normal Limits   yes  COMMENTS:   Tolerated procedure well

## 2013-06-26 NOTE — Progress Notes (Signed)
ANTICOAGULATION CONSULT NOTE - Follow-up Consult  Pharmacy Consult for Heparin Indication: chest pain/ACS  No Known Allergies  Patient Measurements: Height: 5\' 9"  (175.3 cm) Weight: 203 lb 11.3 oz (92.4 kg) IBW/kg (Calculated) : 70.7 Heparin Dosing Weight: ~90 kg  Vital Signs: Temp: 98.4 F (36.9 C) (05/13 0000) Temp src: Oral (05/13 0000) BP: 124/56 mmHg (05/13 0300) Pulse Rate: 39 (05/13 0300)  Labs:  Recent Labs  06/24/13 1943 06/24/13 2154 06/25/13 1500 06/25/13 1902 06/25/13 2029 06/25/13 2128 06/26/13 0030 06/26/13 0228 06/26/13 0236  HGB 14.4  --  14.2 14.4  --   --   --  12.5*  --   HCT 41.0  --  41.5 40.7  --   --   --  35.5*  --   PLT 170  --  204.0 188  --   --   --  152  --   APTT  --   --  28.7  --  33  --   --   --   --   LABPROT  --   --  11.9  --  13.5  --   --   --   --   INR  --   --  1.1*  --  1.05  --   --   --   --   HEPARINUNFRC  --   --   --   --   --   --   --   --  0.45  CREATININE 0.89  --  1.0 0.92  --   --   --   --   --   TROPONINI  --  <0.30  --   --   --  <0.30 <0.30  --   --     Estimated Creatinine Clearance: 88.7 ml/min (by C-G formula based on Cr of 0.92).  Assessment: 68 yo male on heparin for r/o ACS. Heparin level 0.45 (therapeutic) on 1250 units/hr. No bleeding noted. Plan for cath today.  Goal of Therapy:  Heparin level 0.3-0.7 units/ml Monitor platelets by anticoagulation protocol: Yes   Plan:  1. Continue heparin gtt at 1250 units/hr. 2. F/u post cath.  Sherlon Handing, PharmD, BCPS Clinical pharmacist, pager [phone]  06/26/2013 3:49 AM

## 2013-06-27 DIAGNOSIS — I2510 Atherosclerotic heart disease of native coronary artery without angina pectoris: Secondary | ICD-10-CM | POA: Diagnosis not present

## 2013-06-27 DIAGNOSIS — R079 Chest pain, unspecified: Secondary | ICD-10-CM

## 2013-06-27 LAB — CBC WITH DIFFERENTIAL/PLATELET
Basophils Absolute: 0 10*3/uL (ref 0.0–0.1)
Basophils Relative: 0 % (ref 0–1)
EOS ABS: 0.1 10*3/uL (ref 0.0–0.7)
Eosinophils Relative: 2 % (ref 0–5)
HCT: 36.9 % — ABNORMAL LOW (ref 39.0–52.0)
Hemoglobin: 13 g/dL (ref 13.0–17.0)
LYMPHS ABS: 1.4 10*3/uL (ref 0.7–4.0)
LYMPHS PCT: 31 % (ref 12–46)
MCH: 30.3 pg (ref 26.0–34.0)
MCHC: 35.2 g/dL (ref 30.0–36.0)
MCV: 86 fL (ref 78.0–100.0)
Monocytes Absolute: 0.4 10*3/uL (ref 0.1–1.0)
Monocytes Relative: 9 % (ref 3–12)
NEUTROS PCT: 58 % (ref 43–77)
Neutro Abs: 2.6 10*3/uL (ref 1.7–7.7)
Platelets: 156 10*3/uL (ref 150–400)
RBC: 4.29 MIL/uL (ref 4.22–5.81)
RDW: 12.8 % (ref 11.5–15.5)
WBC: 4.5 10*3/uL (ref 4.0–10.5)

## 2013-06-27 LAB — COMPREHENSIVE METABOLIC PANEL
ALBUMIN: 3.4 g/dL — AB (ref 3.5–5.2)
ALT: 28 U/L (ref 0–53)
AST: 17 U/L (ref 0–37)
Alkaline Phosphatase: 94 U/L (ref 39–117)
BUN: 14 mg/dL (ref 6–23)
CO2: 26 mEq/L (ref 19–32)
Calcium: 9.3 mg/dL (ref 8.4–10.5)
Chloride: 102 mEq/L (ref 96–112)
Creatinine, Ser: 0.95 mg/dL (ref 0.50–1.35)
GFR calc non Af Amer: 85 mL/min — ABNORMAL LOW (ref >90)
GLUCOSE: 144 mg/dL — AB (ref 70–99)
Potassium: 4.3 mEq/L (ref 3.7–5.3)
Sodium: 140 mEq/L (ref 137–147)
Total Bilirubin: 0.9 mg/dL (ref 0.3–1.2)
Total Protein: 6 g/dL (ref 6.0–8.3)

## 2013-06-27 LAB — GLUCOSE, CAPILLARY: Glucose-Capillary: 158 mg/dL — ABNORMAL HIGH (ref 70–99)

## 2013-06-27 LAB — MAGNESIUM: Magnesium: 1.9 mg/dL (ref 1.5–2.5)

## 2013-06-27 MED ORDER — METFORMIN HCL ER 500 MG PO TB24: 500.0000 mg | ORAL_TABLET | Freq: Two times a day (BID) | ORAL | Status: DC

## 2013-06-27 MED ORDER — RANOLAZINE ER 500 MG PO TB12: 500.0000 mg | ORAL_TABLET | Freq: Two times a day (BID) | ORAL | Status: DC

## 2013-06-27 NOTE — Discharge Instructions (Signed)
PLEASE REMEMBER TO BRING ALL OF YOUR MEDICATIONS TO EACH OF YOUR FOLLOW-UP OFFICE VISITS. ° °PLEASE ATTEND ALL SCHEDULED FOLLOW-UP APPOINTMENTS.  ° °Activity: Increase activity slowly as tolerated. You may shower, but no soaking baths (or swimming) for 1 week. No driving for 2 days. No lifting over 5 lbs for 1 week. No sexual activity for 1 week.  ° °You May Return to Work: in 1 week (if applicable) ° °Wound Care: You may wash cath site gently with soap and water. Keep cath site clean and dry. If you notice pain, swelling, bleeding or pus at your cath site, please call [phone]. ° ° ° °Cardiac Cath Site Care °Refer to this sheet in the next few weeks. These instructions provide you with information on caring for yourself after your procedure. Your caregiver may also give you more specific instructions. Your treatment has been planned according to current medical practices, but problems sometimes occur. Call your caregiver if you have any problems or questions after your procedure. °HOME CARE INSTRUCTIONS °· You may shower 24 hours after the procedure. Remove the bandage (dressing) and gently wash the site with plain soap and water. Gently pat the site dry.  °· Do not apply powder or lotion to the site.  °· Do not sit in a bathtub, swimming pool, or whirlpool for 5 to 7 days.  °· No bending, squatting, or lifting anything over 10 pounds (4.5 kg) as directed by your caregiver.  °· Inspect the site at least twice daily.  °· Do not drive home if you are discharged the same day of the procedure. Have someone else drive you.  °· You may drive 24 hours after the procedure unless otherwise instructed by your caregiver.  °What to expect: °· Any bruising will usually fade within 1 to 2 weeks.  °· Blood that collects in the tissue (hematoma) may be painful to the touch. It should usually decrease in size and tenderness within 1 to 2 weeks.  °SEEK IMMEDIATE MEDICAL CARE IF: °· You have unusual pain at the site or down the  affected limb.  °· You have redness, warmth, swelling, or pain at the site.  °· You have drainage (other than a small amount of blood on the dressing).  °· You have chills.  °· You have a fever or persistent symptoms for more than 72 hours.  °· You have a fever and your symptoms suddenly get worse.  °· Your leg becomes pale, cool, tingly, or numb.  °· You have heavy bleeding from the site. Hold pressure on the site.  °Document Released: 03/05/2010 Document Revised: 01/20/2011 Document Reviewed: 03/05/2010 °[employer]® Patient Information ©2012 [employer]. ° °

## 2013-06-27 NOTE — Discharge Summary (Signed)
CARDIOLOGY DISCHARGE SUMMARY   Patient ID: Hunter Mcbride MRN: [id number] DOB/AGE: 1945-10-18 68 y.o.  Admit date: 06/25/2013 Discharge date: 06/27/2013  PCP: Jerlyn Ly, MD Primary Cardiologist: Dr. Angelena Form  Primary Discharge Diagnosis:   Unstable angina Secondary Discharge Diagnosis:  Diabetes OSA on CPAP Hypertension Hyperlipidemia GERD  Procedures: CT angiogram of the chest, dissection protocol; left heart cardiac catheterization, coronary arteriogram, left ventriculogram, pressure wire interrogation of the left main coronary artery   Hospital Course: Hunter Mcbride is a 68 y.o. male with a history of CAD. He had recurrent chest pain and came to the hospital where he improved with medical therapy and was admitted for further evaluation and treatment.  His pain was going through to his back so he had a CT angiogram of the chest to rule out dissection. It was negative, results are below. His cardiac enzymes were negative for MI. A 2-D echocardiogram was performed, results below. He had no other critical abnormalities in his labs. He was taken to the cath lab on 05/13, results are below. He had diffuse 3 vessel disease and significant small branch disease. There were several areas of possible ischemia, but the vessels were too small for PCI and medical therapy was recommended. His EF is normal. His left main stenosis was not significant by FFR. Medical therapy was recommended, Ranexa was added.  On 05/14, he was seen by Dr. Angelena Form. He was tolerating a Ranexa well. His vital signs were stable and there were no acute problems and his post procedure labs. No further inpatient workup is indicated and he is considered stable for discharge, to followup as an outpatient.   Labs:   Lab Results  Component Value Date   WBC 4.5 06/27/2013   HGB 13.0 06/27/2013   HCT 36.9* 06/27/2013   MCV 86.0 06/27/2013   PLT 156 06/27/2013     Recent Labs Lab 06/27/13 0402  NA 140  K 4.3  CL 102   CO2 26  BUN 14  CREATININE 0.95  CALCIUM 9.3  PROT 6.0  BILITOT 0.9  ALKPHOS 94  ALT 28  AST 17  GLUCOSE 144*    Recent Labs  06/26/13 0030 06/26/13 0635 06/26/13 1255  TROPONINI <0.30 <0.30 <0.30   Pro B Natriuretic peptide (BNP)  Date/Time Value Ref Range Status  06/25/2013  7:02 PM 30.0  0 - 125 pg/mL Final    Recent Labs  06/25/13 2029  INR 1.05      Radiology: Dg Chest Port 1 View 06/25/2013   CLINICAL DATA:  Chest pain  EXAM: PORTABLE CHEST - 1 VIEW  COMPARISON:  02/27/2012  FINDINGS: Lungs are clear.  No pleural effusion or pneumothorax.  The heart is normal in size.  Right shoulder arthroplasty.  IMPRESSION: No evidence of acute cardiopulmonary disease.   Electronically Signed   By: Julian Hy M.D.   On: 06/25/2013 20:35   Ct Angio Chest Aortic Dissect W &/or W/o 06/26/2013   CLINICAL DATA:  Prior coronary stenting. Patient was discharged on 06/22/2013. Subsequent chest pain.  EXAM: CT ANGIOGRAPHY CHEST WITH CONTRAST  TECHNIQUE: Multidetector CT imaging of the chest was performed using the standard protocol during bolus administration of intravenous contrast. Multiplanar CT image reconstructions and MIPs were obtained to evaluate the vascular anatomy.  CONTRAST:  119mL OMNIPAQUE IOHEXOL 350 MG/ML SOLN  COMPARISON:  DG CHEST 1V PORT dated 06/25/2013; DG CHEST 2 VIEW dated 02/27/2012  FINDINGS: Normal heart size. Coronary artery calcifications and stent grafts are visualized.  Flow is not definitively identified in the distal aspect of the left anterior descending Coronary artery thrombus in this vessel is not excluded. However, the study is technically limited due to motion artifact in this may be artifactual. Normal caliber thoracic aorta without evidence of dissection. Great vessel origins are patent. Central and segmental pulmonary arteries are well opacified and no filling defects are demonstrated suggesting no evidence of significant pulmonary embolus. Esophagus is  decompressed. No significant lymphadenopathy in the chest. No pleural effusions. Visualized portions of the upper abdominal organs are grossly unremarkable. Visualization of lungs is limited due to respiratory motion artifact but no gross consolidation or pneumothorax is visualized. Airways appear patent. Degenerative changes in the spine. No destructive bone lesions appreciated. Postoperative changes in the right shoulder.  Review of the MIP images confirms the above findings.  IMPRESSION: No evidence of aortic dissection. No evidence of significant pulmonary embolus. No evidence of active lung disease. Suggestion of decreased flow in the distal aspect of the left anterior descending Coronary artery. This is indeterminate due to motion artifact.   Electronically Signed   By: Lucienne Capers M.D.   On: 06/26/2013 00:32    Cardiac Cath: 05/13 Coronary angiography:  Coronary dominance: Right  Left Main: Mildly calcified with 30-40% ostial stenosis  Left Anterior Descending (LAD): Heavily calcified with minor irregularities proximally. Overlapped stents are patent in the mid and distal area with no significant restenosis. The second septal perforator is jailed by the stent with 80-90% ostial stenosis but normal flow. In the distal LAD close to the apex, there is a 80% stenosis  1st diagonal (D1): Small in size with 70% ostial stenosis.  2nd diagonal (D2): This is a 1.5 mm vessel although it does extend distally to a large portion of the lateral wall. There is 90% ostial and proximal stenosis.  3rd diagonal (D3): Very small in size.  Circumflex (LCx): Normal in size, mildly calcified and nondominant. There is 40-50% stenosis in the midsegment after the origin of OM1.  1st obtuse marginal: Large in size with 50-60% proximal stenosis.  2nd obtuse marginal: very small in size  3rd obtuse marginal: Very small in size.  Ramus Intermedius: I very small in size with 95% ostial stenosis.  Right Coronary Artery:  Large in size and dominant. I the vessel is moderately calcified. There is diffuse 20% disease in the proximal and midsegment.  Posterior descending artery: That a 60% ostial stenosis.  Posterior AV segment: Was 50-60% ostial stenosis  Posterolateral branchs: Minor irregularities. Left ventriculography: Left ventricular systolic function is normal , LVEF is estimated at 55 %,  pressure wire interrogation Note: Following the diagnostic procedure, the decision was made to proceed with pressure wire interrogation of the left main coronary artery. ACT was 256 with heparin. Once a therapeutic ACT was achieved, a 6 [place] JL 3.5 guide catheter was inserted. A volcano prime wire was used in the standard fashion. This was advanced into OM 1. The patient was started on intravenous adenosine at 140 mcg per kilogram per minute. Continuous recording of pressure was performed. FFR ratio was 0.93 with a resting pressure of 0.96. This indicated no physiologic significance. The wire and guiding catheters were removed. The patient tolerated the procedure well. There were no immediate procedural complications. A TR band was used for radial hemostasis. The patient was transferred to the post catheterization recovery area for further monitoring.  Final Conclusions:  1. Diffuse borderline significant three-vessel coronary artery disease with significant small branch disease.  The stents in the LAD are patent. The disease in the RCA and left circumflex is unchanged. The patient might be having angina from the jailed second septal perforator. He is also probably ischemic in the second diagonal territory and other small branches as well as distal LAD. However, these branches are 1.5 mm or less in diameter.  2. Normal LV systolic function and mildly elevated left ventricular end-diastolic pressure.  3. Mild ostial left main stenosis which was not significant by pressure wire interrogation.  Recommendations:  Recommend optimizing  medical therapy. He did not tolerate Supinski-acting nitroglycerin due to headache. I added Ranexa. Monitor today and ambulate. Possible discharge in the morning.  EKG: 06/26/2013 Sinus bradycardia, no acute ischemic changes Vent. rate 54 BPM PR interval 214 ms QRS duration 96 ms QT/QTc 416/394 ms P-R-T axes 39 29 25  Echo: 06/26/2013 Conclusions - Left ventricle: The cavity size was normal. Wall thickness was increased in a pattern of mild LVH. The estimated ejection fraction was 60%. Wall motion was normal; there were no regional wall motion abnormalities. - Left atrium: The atrium was mildly dilated. - Right ventricle: The cavity size was normal. Systolic function was normal.   FOLLOW UP PLANS AND APPOINTMENTS No Known Allergies   Medication List         amLODipine 5 MG tablet  Commonly known as:  NORVASC  Take 1 tablet (5 mg total) by mouth daily.     aspirin 81 MG tablet  Take 81 mg by mouth at bedtime.     atenolol 50 MG tablet  Commonly known as:  TENORMIN  Take 1 tablet (50 mg total) by mouth daily.     calcium carbonate 500 MG chewable tablet  Commonly known as:  TUMS - dosed in mg elemental calcium  Chew 2 tablets by mouth daily as needed for indigestion or heartburn.     chlorpheniramine-HYDROcodone 10-8 MG/5ML Lqcr  Commonly known as:  TUSSIONEX  Take 5 mLs by mouth every 12 (twelve) hours as needed for cough.     cimetidine 200 MG tablet  Commonly known as:  TAGAMET  Take 200 mg by mouth daily as needed (for heartburn).     clopidogrel 75 MG tablet  Commonly known as:  PLAVIX  Take 1 tablet (75 mg total) by mouth daily with breakfast.     gabapentin 300 MG capsule  Commonly known as:  NEURONTIN  Take 300 mg by mouth at bedtime.     hydrochlorothiazide 25 MG tablet  Commonly known as:  HYDRODIURIL  Take 25 mg by mouth daily.     irbesartan 300 MG tablet  Commonly known as:  AVAPRO  Take 150 mg by mouth daily.     LORazepam 0.5 MG tablet    Commonly known as:  ATIVAN  Take 0.5 mg by mouth as needed for anxiety.     metFORMIN 500 MG 24 hr tablet  Commonly known as:  GLUCOPHAGE-XR  Take 1 tablet (500 mg total) by mouth 2 (two) times daily. HOLD for 2 days. Restart Sunday 06/30/2013.     multivitamin with minerals Tabs tablet  Take 1 tablet by mouth daily.     nitroGLYCERIN 0.4 MG SL tablet  Commonly known as:  NITROSTAT  Place 1 tablet (0.4 mg total) under the tongue every 5 (five) minutes as needed. For chest pain     ONGLYZA 5 MG Tabs tablet  Generic drug:  saxagliptin HCl  Take 5 mg by mouth daily.  pantoprazole 40 MG tablet  Commonly known as:  PROTONIX  Take 40 mg by mouth daily.     ranolazine 500 MG 12 hr tablet  Commonly known as:  RANEXA  Take 1 tablet (500 mg total) by mouth 2 (two) times daily.     rosuvastatin 20 MG tablet  Commonly known as:  CRESTOR  Take 10 mg by mouth every other day.     traMADol 50 MG tablet  Commonly known as:  ULTRAM  Take 50 mg by mouth every 6 (six) hours as needed (for pain).     TYLENOL ARTHRITIS PAIN 650 MG CR tablet  Generic drug:  acetaminophen  Take 1,300 mg by mouth 2 (two) times daily as needed. For pain        Discharge Orders   Future Appointments Provider Department Dept Phone   07/04/2013 10:00 AM Burtis Junes, NP [facility] [phone]   Future Orders Complete By Expires   Diet - low sodium heart healthy  As directed    Diet Carb Modified  As directed    Increase activity slowly  As directed      Follow-up Information   Follow up with Truitt Merle, NP On 07/04/2013. (At 10:00 AM)    Specialty:  Nurse Practitioner   Contact information:   [place] [place] [address] [phone]       BRING ALL MEDICATIONS WITH YOU TO FOLLOW UP APPOINTMENTS  Time spent with patient to include physician time: 44 min Signed: Lonn Georgia, PA-C 06/27/2013, 8:30 AM Co-Sign MD

## 2013-06-27 NOTE — Discharge Summary (Signed)
See full note this am. cdm 

## 2013-06-27 NOTE — Progress Notes (Signed)
SUBJECTIVE: No chest pain this am. No SOB.   BP 127/72  Pulse 56  Temp(Src) 97.7 F (36.5 C) (Oral)  Resp 20  Ht 5\' 9"  (1.753 m)  Wt 204 lb 5.9 oz (92.7 kg)  BMI 30.17 kg/m2  SpO2 100%  Intake/Output Summary (Last 24 hours) at 06/27/13 0347 Last data filed at 06/26/13 2106  Gross per 24 hour  Intake 1695.17 ml  Output    850 ml  Net 845.17 ml    PHYSICAL EXAM General: Well developed, well nourished, in no acute distress. Alert and oriented x 3.  Psych:  Good affect, responds appropriately Neck: No JVD. No masses noted.  Lungs: Clear bilaterally with no wheezes or rhonci noted.  Heart: RRR with no murmurs noted. Abdomen: Bowel sounds are present. Soft, non-tender.  Extremities: No lower extremity edema.   LABS: Basic Metabolic Panel:  Recent Labs  06/26/13 0236 06/27/13 0402  NA 139 140  K 3.3* 4.3  CL 104 102  CO2 21 26  GLUCOSE 131* 144*  BUN 19 14  CREATININE 0.82 0.95  CALCIUM 9.1 9.3  MG 1.9 1.9   CBC:  Recent Labs  06/26/13 0228 06/27/13 0402  WBC 4.5 4.5  NEUTROABS 2.5 2.6  HGB 12.5* 13.0  HCT 35.5* 36.9*  MCV 84.5 86.0  PLT 152 156   Cardiac Enzymes:  Recent Labs  06/26/13 0030 06/26/13 0635 06/26/13 1255  TROPONINI <0.30 <0.30 <0.30   Current Meds: . adenosine  16 mL Intravenous Once  . amLODipine  5 mg Oral Daily  . aspirin EC  81 mg Oral Daily  . atenolol  50 mg Oral Daily  . atorvastatin  80 mg Oral q1800  . clopidogrel  75 mg Oral Q breakfast  . famotidine  20 mg Oral Daily  . gabapentin  300 mg Oral QHS  . hydrochlorothiazide  25 mg Oral Daily  . insulin aspart  0-15 Units Subcutaneous TID WC  . insulin aspart  0-5 Units Subcutaneous QHS  . irbesartan  150 mg Oral Daily  . ranolazine  500 mg Oral BID  . sodium chloride  3 mL Intravenous Q12H   Echo: 06/26/13: Left ventricle: The cavity size was normal. Wall thickness was increased in a pattern of mild LVH. The estimated ejection fraction was 60%. Wall motion  was normal; there were no regional wall motion abnormalities. - Left atrium: The atrium was mildly dilated. - Right ventricle: The cavity size was normal. Systolic function was normal.  CT chest: 06/25/13:  No evidence of aortic dissection. No evidence of significant  pulmonary embolus. No evidence of active lung disease. Suggestion of  decreased flow in the distal aspect of the left anterior descending  Coronary artery. This is indeterminate due to motion artifact.  Cardiac cath 06/26/13: Left Main: Mildly calcified with 30-40% ostial stenosis  Left Anterior Descending (LAD): Heavily calcified with minor irregularities proximally. Overlapped stents are patent in the mid and distal area with no significant restenosis. The second septal perforator is jailed by the stent with 80-90% ostial stenosis but normal flow. In the distal LAD close to the apex, there is a 80% stenosis  1st diagonal (D1): Small in size with 70% ostial stenosis.  2nd diagonal (D2): This is a 1.5 mm vessel although it does extend distally to a large portion of the lateral wall. There is 90% ostial and proximal stenosis.  3rd diagonal (D3): Very small in size.  Circumflex (LCx): Normal in size, mildly calcified  and nondominant. There is 40-50% stenosis in the midsegment after the origin of OM1.  1st obtuse marginal: Large in size with 50-60% proximal stenosis.  2nd obtuse marginal: very small in size  3rd obtuse marginal: Very small in size.  Ramus Intermedius: I very small in size with 95% ostial stenosis.  Right Coronary Artery: Large in size and dominant. I the vessel is moderately calcified. There is diffuse 20% disease in the proximal and midsegment.  Posterior descending artery: That a 60% ostial stenosis.  Posterior AV segment: Was 50-60% ostial stenosis  Posterolateral branchs: Minor irregularities. Left ventriculography: Left ventricular systolic function is normal , LVEF is estimated at 55 % FFR  0.93.  ASSESSMENT AND PLAN:  1. CAD/Chest pain: Pt admitted from office with chest pain after recent [place] PCI procedure last week with 3 stents placed mid LAD. As stated in cath report last week, pt with diffuse plaque in multiple small branches. A large septal branch was jailed by the LAD stents. Cardiac cath yesterday per Dr. Fletcher Anon with patent LAD stents. He does have mild stenosis ostial left main. FFR was 0.93 yesterday suggesting this is not flow limiting. No focal targets for PCI. Moderate disease distal RCA, mid Circumflex and moderately severe disease in the small caliber Diagonal branch. Will continue medical management. Ranexa added yesterday. Continue beta blocker, Norvasc. He may have angina from his small vessel disease but will try to manage with medications as there are no good targets for PCI at this time. His LAD becomes very small distally and would likely not be a good target for bypass in the future.  Discharge home today. Follow up Dr. Acie Fredrickson or Truitt Merle, NP in 2-3 weeks.   Burnell Blanks  5/14/20157:11 AM

## 2013-07-03 DIAGNOSIS — Z4789 Encounter for other orthopedic aftercare: Secondary | ICD-10-CM | POA: Diagnosis not present

## 2013-07-04 ENCOUNTER — Other Ambulatory Visit: Payer: Self-pay | Admitting: *Deleted

## 2013-07-04 ENCOUNTER — Ambulatory Visit (INDEPENDENT_AMBULATORY_CARE_PROVIDER_SITE_OTHER): Payer: Medicare Other | Admitting: Nurse Practitioner

## 2013-07-04 ENCOUNTER — Encounter: Payer: Self-pay | Admitting: Nurse Practitioner

## 2013-07-04 VITALS — BP 110/70 | HR 64 | Ht 69.0 in | Wt 201.1 lb

## 2013-07-04 DIAGNOSIS — I200 Unstable angina: Secondary | ICD-10-CM | POA: Diagnosis not present

## 2013-07-04 DIAGNOSIS — Z9861 Coronary angioplasty status: Secondary | ICD-10-CM

## 2013-07-04 DIAGNOSIS — I2510 Atherosclerotic heart disease of native coronary artery without angina pectoris: Secondary | ICD-10-CM

## 2013-07-04 DIAGNOSIS — Z955 Presence of coronary angioplasty implant and graft: Secondary | ICD-10-CM

## 2013-07-04 MED ORDER — ATENOLOL 50 MG PO TABS: 50.0000 mg | ORAL_TABLET | Freq: Every day | ORAL | Status: DC

## 2013-07-04 MED ORDER — RANOLAZINE ER 500 MG PO TB12: 500.0000 mg | ORAL_TABLET | Freq: Two times a day (BID) | ORAL | Status: DC

## 2013-07-04 MED ORDER — AMLODIPINE BESYLATE 5 MG PO TABS: 5.0000 mg | ORAL_TABLET | Freq: Every day | ORAL | Status: DC

## 2013-07-04 NOTE — Progress Notes (Signed)
[name] Date of Birth: [birth year] Medical Record #[id number]  History of Present Illness: Mr. Vosler is seen back today for a post hospital/post repeat cath. Seen for Dr. Acie Fredrickson. His wife is a former patient of Dr. Susa Simmonds that I have taken care of. Mr. Desroches has HTN, HLD, DM and OSA. Past normal Myoview study.   He most recently presented with a 2 month history of exertional chest pain - had abnormal Myoview - then cathed and had DES x 3 to the LAD. Otherwise had moderate but nonobstructive disease With a normal EF of 55 to 60%. He was discharged 06/22/13. On Plavix.   Back in the ER prior to my last visit - recurrent chest pain - referred back for repeat cath - to continue with medical management.   Comes back today. Here with his wife - [name]. He is doing better. Anxious to get back to his regular activities. Some fleeting sharp chest pains but overall notes he is at least 80% improved. Feels ok on his medicines. Has more issues with his metformin - this hurts his stomach - which in turn makes him go for foods he should try to avoid. He will be seeing Dr. Joylene Draft soon. Not dizzy or lightheaded. BP ok at home.   Current Outpatient Prescriptions  Medication Sig Dispense Refill  . acetaminophen (TYLENOL ARTHRITIS PAIN) 650 MG CR tablet Take 1,300 mg by mouth 2 (two) times daily as needed. For pain      . amLODipine (NORVASC) 5 MG tablet Take 1 tablet (5 mg total) by mouth daily.  30 tablet  3  . aspirin 81 MG tablet Take 81 mg by mouth at bedtime.       [name] atenolol (TENORMIN) 50 MG tablet Take 1 tablet (50 mg total) by mouth daily.  30 tablet  3  . calcium carbonate (TUMS - DOSED IN MG ELEMENTAL CALCIUM) 500 MG chewable tablet Chew 2 tablets by mouth daily as needed for indigestion or heartburn.      . chlorpheniramine-HYDROcodone (TUSSIONEX) 10-8 MG/5ML LQCR Take 5 mLs by mouth every 12 (twelve) hours as needed for cough.      . cimetidine (TAGAMET) 200 MG tablet Take 200 mg by mouth daily  as needed (for heartburn).      . clopidogrel (PLAVIX) 75 MG tablet Take 1 tablet (75 mg total) by mouth daily with breakfast.  30 tablet  11  . gabapentin (NEURONTIN) 300 MG capsule Take 300 mg by mouth at bedtime.        . hydrochlorothiazide (HYDRODIURIL) 25 MG tablet Take 25 mg by mouth daily.       . irbesartan (AVAPRO) 300 MG tablet Take 150 mg by mouth daily.       [name] LORazepam (ATIVAN) 0.5 MG tablet Take 0.5 mg by mouth as needed for anxiety.       . metFORMIN (GLUCOPHAGE-XR) 500 MG 24 hr tablet Take 500 mg by mouth 2 (two) times daily. 1 tablet in the am 2 tablets in the pm      . Multiple Vitamin (MULTIVITAMIN WITH MINERALS) TABS tablet Take 1 tablet by mouth daily.      . nitroGLYCERIN (NITROSTAT) 0.4 MG SL tablet Place 1 tablet (0.4 mg total) under the tongue every 5 (five) minutes as needed. For chest pain  25 tablet  5  . ONGLYZA 5 MG TABS tablet Take 5 mg by mouth daily.       . pantoprazole (PROTONIX) 40 MG tablet Take 40  mg by mouth daily.      . ranolazine (RANEXA) 500 MG 12 hr tablet Take 1 tablet (500 mg total) by mouth 2 (two) times daily.  60 tablet  11  . rosuvastatin (CRESTOR) 40 MG tablet Take 40 mg by mouth daily.      . traMADol (ULTRAM) 50 MG tablet Take 50 mg by mouth every 6 (six) hours as needed (for pain).        No current facility-administered medications for this visit.    No Known Allergies  Past Medical History  Diagnosis Date  . Chest pain   . Hyperlipidemia   . Hypertension   . Gout     "maybe twice in my life"  . GERD (gastroesophageal reflux disease)   . Myocardial infarction     "/Dr. Katharina Caper I've had one between 2014-2015) (06/21/2013)  . Asthma     "not sure if this is true or not" (06/21/2013)  . OSA on CPAP   . Type II diabetes mellitus   . Headache(784.0)     "weekly for the last 3-4 months" (06/21/2013)  . Arthritis     "lower back; right shoulder; left thumb; joints" (06/21/2013)  . Osteoarthritis   . CAD (coronary artery disease) May 2015     s/p PTCA/DES x 3 to mid LAD, mod non-ob disease in Cx and RCA May 2015    Past Surgical History  Procedure Laterality Date  . Cardiovascular stress test  06/10/2008    EF 68%  . Carpal tunnel release Bilateral   . Excision morton's neuroma Left   . Tonsillectomy    . Eye surgery Left     "removed film over my eye"  . Total shoulder arthroplasty  03/01/2012    Procedure: TOTAL SHOULDER ARTHROPLASTY;  Surgeon: Marin Shutter, MD;  Location: [facility];  Service: Orthopedics;  Laterality: Right;  . Cardiac catheterization  1980's    "once"  . Coronary angioplasty with stent placement  06/21/2013    "3"  . Hernia repair Left   . Umbilical hernia repair    . Joint replacement Left 03/2013    "thumb"    History  Smoking status  . Never Smoker   Smokeless tobacco  . Never Used    History  Alcohol Use  . Yes    Comment: "used to drink a little beer occasionally; never was a drinker; nothing now"    Family History  Problem Relation Age of Onset  . Diabetes type II Mother   . Heart attack Father   . Coronary artery disease Cousin   . Heart disease Brother     Review of Systems: The review of systems is per the HPI.  All other systems were reviewed and are negative.  Physical Exam: BP 110/70  Pulse 64  Ht 5\' 9"  (1.753 m)  Wt 201 lb 1.9 oz (91.227 kg)  BMI 29.69 kg/m2  SpO2 98% Patient is very pleasant and in no acute distress. Skin is warm and dry. Color is normal.  HEENT is unremarkable. Normocephalic/atraumatic. PERRL. Sclera are nonicteric. Neck is supple. No masses. No JVD. Lungs are clear. Cardiac exam shows a regular rate and rhythm. Abdomen is soft. Extremities are without edema. Gait and ROM are intact. No gross neurologic deficits noted.  LABORATORY DATA:  Lab Results  Component Value Date   WBC 4.5 06/27/2013   HGB 13.0 06/27/2013   HCT 36.9* 06/27/2013   PLT 156 06/27/2013   GLUCOSE 144* 06/27/2013  CHOL 123 04/11/2011   TRIG 119.0 04/11/2011   HDL 41.60  04/11/2011   LDLCALC 58 04/11/2011   ALT 28 06/27/2013   AST 17 06/27/2013   NA 140 06/27/2013   K 4.3 06/27/2013   CL 102 06/27/2013   CREATININE 0.95 06/27/2013   BUN 14 06/27/2013   CO2 26 06/27/2013   INR 1.05 06/25/2013   HGBA1C 8.4* 06/26/2013   Procedure Performed:  1. Left Heart Catheterization 2. Selective Coronary Angiography 3. Left ventricular angiogram 4. PTCA/DES x 3 mid LAD Operator: Lauree Chandler, MD  Arterial access site: Right radial artery.  Indication: 68 yo male with history of DM, HTN, HLD with recent unstable angina (class III), abnormal stress myoview with anterior wall ischemia.  Procedure Details:  The risks, benefits, complications, treatment options, and expected outcomes were discussed with the patient. The patient and/or family concurred with the proposed plan, giving informed consent. The patient was brought to the cath lab after IV hydration was begun and oral premedication was given. The patient was further sedated with Versed and Fentanyl. The right wrist was assessed with an Allens test which was positive. The right wrist was prepped and draped in a sterile fashion. 1% lidocaine was used for local anesthesia. Using the modified Seldinger access technique, a 5/6 French sheath was placed in the right radial artery. 3 mg Verapamil was given through the sheath. 5000 units IV heparin was given. Standard diagnostic catheters were used to perform selective coronary angiography. A pigtail catheter was used to perform a left ventricular angiogram. He was found to have severe stenosis in the calcified, diffusely diseased mid LAD. I elected to proceed to PCI of the LAD.  PCI Note: The left main was engaged with a XB LAD 3.5 guiding catheter. He was given Plavix 600 mg po x 1. He was given additional IV heparin and ACT was over 250. Total Heparin IV during case was 14,000 units. I then passed a Cougar IC wire down the LAD. I could not pass a balloon beyond the mid vessel so I  used another Cougar IC wire as a buddy wire for support. I was able to pass a 1.5 x 15 mm balloon into the distal vessel and performed several balloon inflations. I then passed a 2.0 x 20 mm balloon into the mid vessel and performed one inflation. I could not pass a stent down the vessel and I had poor guide support. I changed out for an EBU 3.5 guide and passed another Cougar IC wire and a Mailman wire down the LAD. I then used a 2.0 x 12 mm balloon to pre-dilate the entire mid vessel. I was then able to deliver stents. I placed a 2.25 x 18 mm Resolute Integrity DES, followed by an overlapping 2.25 x 18 mm Resolute Integrity DES in the mid vessel and then a 2.25 x 22 mm Resolute Integrity DES in the most proximal segment of the mid vessel. The stents were post-dilated with a 2.25 x 20 mm Coolidge balloon x 3. The stenosis was taken from 99% down to 0%.  The sheath was removed from the right radial artery and a Terumo hemostasis band was applied at the arteriotomy site on the right wrist. There were no immediate complications. The patient was taken to the recovery area in stable condition.  Hemodynamic Findings:  Central aortic pressure: 142/72  Left ventricular pressure: 143/10/19  Angiographic Findings:  Left main: Ostial 30-40% stenosis.  Left Anterior Descending Artery: Moderate caliber vessel that  courses to the apex. There is an ostial 40% calcified stenosis. The proximal vessel has diffuse 20% stenosis. The mid vessel is moderately calcified with diffuse 99% stenosis. The distal vessel has diffuse 50% stenosis. The entire vessel is diffusely diseased. The first diagonal branch is small in caliber with proximal 50% stenosis.  Circumflex Artery: Moderate caliber vessel with 20% proximal stenosis, 60% ostium of continuation of the AV groove Circumflex beyond the takeoff of the first OM branch. The first OM branch has a focal 50% stenosis.  Right Coronary Artery: Large caliber dominant calcified vessel with  proximal 20% stenosis, mid 40% stenosis, distal 30% stenosis. The ostium of the posterolateral branch has 40% stenosis. The ostium of the PDA has 50% stenosis.  Left Ventricular Angiogram: LVEF=55-60%  Impression:  1. Unstable angina  2. Severe stenosis in the diffusely diseased mid LAD, now s/p successful PTCA/DES x 3 mid LAD  3. Moderate non-obstructive disease in the Circumflex and RCA  4. Normal LV systolic function  Recommendations: He will need dual anti-platelet therapy with ASA and Plavix for at least one year. Given his diabetes and diffuse CAD, would recommend dual antiplatelet therapy for lifetime. Home in am if stable.  Complications: None. The patient tolerated the procedure well.    Procedure: Left Heart Cath, Selective Coronary Angiography, LV angiography, pressure wire interrogation of the left main coronary artery  Indication: Unstable angina with recent stenting of the LAD  Medications:  Sedation: 2 mg IV Versed, 75 mcg IV Fentanyl  Contrast: 90 Omnipaque  Procedural Details: The right wrist was prepped, draped, and anesthetized with 1% lidocaine. Using the modified Seldinger technique, a 5 French Slender sheath was introduced into the right radial artery. 3 mg of verapamil was administered through the sheath, weight-based unfractionated heparin was administered intravenously. A Jackie catheter was used for selective coronary angiography and left ventriculography. Catheter exchanges were performed over an exchange length guidewire. There were no immediate procedural complications.  Procedural Findings:  Hemodynamics:  AO: 135/5 her6 mmHg  LV: 131/7 mmHg  LVEDP: 16 mmHg  Coronary angiography:  Coronary dominance: Right  Left Main: Mildly calcified with 30-40% ostial stenosis  Left Anterior Descending (LAD): Heavily calcified with minor irregularities proximally. Overlapped stents are patent in the mid and distal area with no significant restenosis. The second septal  perforator is jailed by the stent with 80-90% ostial stenosis but normal flow. In the distal LAD close to the apex, there is a 80% stenosis  1st diagonal (D1): Small in size with 70% ostial stenosis.  2nd diagonal (D2): This is a 1.5 mm vessel although it does extend distally to a large portion of the lateral wall. There is 90% ostial and proximal stenosis.  3rd diagonal (D3): Very small in size.  Circumflex (LCx): Normal in size, mildly calcified and nondominant. There is 40-50% stenosis in the midsegment after the origin of OM1.  1st obtuse marginal: Large in size with 50-60% proximal stenosis.  2nd obtuse marginal: very small in size  3rd obtuse marginal: Very small in size.  Ramus Intermedius: I very small in size with 95% ostial stenosis.  Right Coronary Artery: Large in size and dominant. I the vessel is moderately calcified. There is diffuse 20% disease in the proximal and midsegment.  Posterior descending artery: That a 60% ostial stenosis.  Posterior AV segment: Was 50-60% ostial stenosis  Posterolateral branchs: Minor irregularities. Left ventriculography: Left ventricular systolic function is normal , LVEF is estimated at 55 %,  pressure wire interrogation  Note: Following the diagnostic procedure, the decision was made to proceed with pressure wire interrogation of the left main coronary artery. ACT was 256 with heparin. Once a therapeutic ACT was achieved, a 6 [place] JL 3.5 guide catheter was inserted. A volcano prime wire was used in the standard fashion. This was advanced into OM 1. The patient was started on intravenous adenosine at 140 mcg per kilogram per minute. Continuous recording of pressure was performed. FFR ratio was 0.93 with a resting pressure of 0.96. This indicated no physiologic significance. The wire and guiding catheters were removed. The patient tolerated the procedure well. There were no immediate procedural complications. A TR band was used for radial hemostasis. The  patient was transferred to the post catheterization recovery area for further monitoring.  Final Conclusions:  1. Diffuse borderline significant three-vessel coronary artery disease with significant small branch disease. The stents in the LAD are patent. The disease in the RCA and left circumflex is unchanged. The patient might be having angina from the jailed second septal perforator. He is also probably ischemic in the second diagonal territory and other small branches as well as distal LAD. However, these branches are 1.5 mm or less in diameter.  2. Normal LV systolic function and mildly elevated left ventricular end-diastolic pressure.  3. Mild ostial left main stenosis which was not significant by pressure wire interrogation.  Recommendations:  Recommend optimizing medical therapy. He did not tolerate Staley-acting nitroglycerin due to headache. I added Ranexa. Monitor today and ambulate. Possible discharge in the morning.  [name] MD, [facility]  06/26/2013, 9:44 AM      Assessment / Plan:  1. CAD with recent history of unstable angina - abnormal Myoview followed by cath with DES x 3 to the LAD - has moderate nonobstructive disease otherwise - has had recurrent chest pain - and was recathed - will be managed medically. He is improved. I have left him on his current regimen. See back in a month. He will think about rehab. Ok to start resuming his normal activities. I would hope that when he comes back he will be back to doing all of his previous activities with very minimal problem.   2. HTN - BP is great.   3. HLD - on statin   4. DM - he will ask Dr. Joylene Draft about changing his metformin.  Patient is agreeable to this plan and will call if any problems develop in the interim.   Burtis Junes, RN, [facility]  [address] [place]  [place], [place] [address]  [phone]

## 2013-07-04 NOTE — Patient Instructions (Addendum)
Stay on your current medicines  Ok to gradually resume your normal activities  If you would like to go to cardiac rehab let me know  See me and Dr. Acie Fredrickson in one month  Call the [place] office at [phone] if you have any questions, problems or concerns.

## 2013-07-11 ENCOUNTER — Ambulatory Visit: Payer: Medicare Other | Admitting: Physician Assistant

## 2013-07-18 ENCOUNTER — Other Ambulatory Visit: Payer: Self-pay | Admitting: *Deleted

## 2013-07-18 MED ORDER — CLOPIDOGREL BISULFATE 75 MG PO TABS: 75.0000 mg | ORAL_TABLET | Freq: Every day | ORAL | Status: DC

## 2013-08-12 ENCOUNTER — Ambulatory Visit (INDEPENDENT_AMBULATORY_CARE_PROVIDER_SITE_OTHER): Payer: Medicare Other | Admitting: Nurse Practitioner

## 2013-08-12 ENCOUNTER — Encounter: Payer: Self-pay | Admitting: Nurse Practitioner

## 2013-08-12 VITALS — BP 118/72 | HR 61 | Ht 69.5 in | Wt 203.0 lb

## 2013-08-12 DIAGNOSIS — E785 Hyperlipidemia, unspecified: Secondary | ICD-10-CM | POA: Diagnosis not present

## 2013-08-12 DIAGNOSIS — Z9861 Coronary angioplasty status: Secondary | ICD-10-CM

## 2013-08-12 DIAGNOSIS — I10 Essential (primary) hypertension: Secondary | ICD-10-CM

## 2013-08-12 DIAGNOSIS — I2510 Atherosclerotic heart disease of native coronary artery without angina pectoris: Secondary | ICD-10-CM | POA: Diagnosis not present

## 2013-08-12 DIAGNOSIS — I259 Chronic ischemic heart disease, unspecified: Secondary | ICD-10-CM | POA: Diagnosis not present

## 2013-08-12 DIAGNOSIS — Z955 Presence of coronary angioplasty implant and graft: Secondary | ICD-10-CM

## 2013-08-12 NOTE — Patient Instructions (Addendum)
Stay on your current medicines  See me in 3 months  Call the [place] office at [phone] if you have any questions, problems or concerns.

## 2013-08-12 NOTE — Progress Notes (Signed)
[name] Date of Birth: Nov 25, 1945 Medical Record #[id number]  History of Present Illness: Mr. Scripter is seen back today for a 1 month check. Seen for Dr. Acie Fredrickson. His wife is a former patient of Dr. Susa Simmonds that I have taken care of. Mr. Sangalang has HTN, HLD, DM and OSA. Past normal Myoview study.   He most recently presented with a 2 month history of exertional chest pain - had abnormal Myoview - then cathed and had DES x 3 to the LAD. Otherwise had moderate but nonobstructive disease With a normal EF of 55 to 60%. He was discharged 06/22/13. On Plavix.   Has been back here and to the ER with chest pain  - referred back for repeat cath - to continue with medical management. Ranexa added. He did not tolerate Steveson acting NTG.   Seen last month and was improved from our standpoint - anxious to resume his regular activities.  Comes back today. Here with his wife - [name]. Still with issues with his Metformin - does not see Dr. Joylene Draft until August - he is pretty sure that this makes him feel very "sick". No chest pain. He is basically back to all of his activities. He is happy with how he is doing. He does not like taking "this much medicine". He is interested in cardiac rehab.   Current Outpatient Prescriptions  Medication Sig Dispense Refill  . acetaminophen (TYLENOL ARTHRITIS PAIN) 650 MG CR tablet Take 1,300 mg by mouth 2 (two) times daily as needed. For pain      . amLODipine (NORVASC) 5 MG tablet Take 1 tablet (5 mg total) by mouth daily.  90 tablet  3  . aspirin 81 MG tablet Take 81 mg by mouth at bedtime.       [name] atenolol (TENORMIN) 50 MG tablet Take 1 tablet (50 mg total) by mouth daily.  90 tablet  3  . atorvastatin (LIPITOR) 80 MG tablet Take 80 mg by mouth daily.      . calcium carbonate (TUMS - DOSED IN MG ELEMENTAL CALCIUM) 500 MG chewable tablet Chew 2 tablets by mouth daily as needed for indigestion or heartburn.      . chlorpheniramine-HYDROcodone (TUSSIONEX) 10-8 MG/5ML  LQCR Take 5 mLs by mouth every 12 (twelve) hours as needed for cough.      . cimetidine (TAGAMET) 200 MG tablet Take 200 mg by mouth daily as needed (for heartburn).      . clopidogrel (PLAVIX) 75 MG tablet Take 1 tablet (75 mg total) by mouth daily with breakfast.  90 tablet  0  . gabapentin (NEURONTIN) 300 MG capsule Take 300 mg by mouth at bedtime.        . hydrochlorothiazide (HYDRODIURIL) 25 MG tablet Take 25 mg by mouth daily.       . irbesartan (AVAPRO) 300 MG tablet Take 150 mg by mouth daily.       [name] LORazepam (ATIVAN) 0.5 MG tablet Take 0.5 mg by mouth as needed for anxiety.       . metFORMIN (GLUCOPHAGE-XR) 500 MG 24 hr tablet Take 500 mg by mouth 2 (two) times daily. 1 tablet in the am 2 tablets in the pm      . Multiple Vitamin (MULTIVITAMIN WITH MINERALS) TABS tablet Take 1 tablet by mouth daily.      . nitroGLYCERIN (NITROSTAT) 0.4 MG SL tablet Place 1 tablet (0.4 mg total) under the tongue every 5 (five) minutes as needed. For chest  pain  25 tablet  5  . ONGLYZA 5 MG TABS tablet Take 5 mg by mouth daily.       . pantoprazole (PROTONIX) 40 MG tablet Take 40 mg by mouth daily.      . ranolazine (RANEXA) 500 MG 12 hr tablet Take 1 tablet (500 mg total) by mouth 2 (two) times daily.  180 tablet  3  . traMADol (ULTRAM) 50 MG tablet Take 50 mg by mouth every 6 (six) hours as needed (for pain).        No current facility-administered medications for this visit.    No Known Allergies  Past Medical History  Diagnosis Date  . Chest pain   . Hyperlipidemia   . Hypertension   . Gout     "maybe twice in my life"  . GERD (gastroesophageal reflux disease)   . Myocardial infarction     "/Dr. Katharina Caper I've had one between 2014-2015) (06/21/2013)  . Asthma     "not sure if this is true or not" (06/21/2013)  . OSA on CPAP   . Type II diabetes mellitus   . Headache(784.0)     "weekly for the last 3-4 months" (06/21/2013)  . Arthritis     "lower back; right shoulder; left thumb; joints"  (06/21/2013)  . Osteoarthritis   . CAD (coronary artery disease) May 2015    s/p PTCA/DES x 3 to mid LAD, mod non-ob disease in Cx and RCA May 2015    Past Surgical History  Procedure Laterality Date  . Cardiovascular stress test  06/10/2008    EF 68%  . Carpal tunnel release Bilateral   . Excision morton's neuroma Left   . Tonsillectomy    . Eye surgery Left     "removed film over my eye"  . Total shoulder arthroplasty  03/01/2012    Procedure: TOTAL SHOULDER ARTHROPLASTY;  Surgeon: Marin Shutter, MD;  Location: [facility];  Service: Orthopedics;  Laterality: Right;  . Cardiac catheterization  1980's    "once"  . Coronary angioplasty with stent placement  06/21/2013    "3"  . Hernia repair Left   . Umbilical hernia repair    . Joint replacement Left 03/2013    "thumb"    History  Smoking status  . Never Smoker   Smokeless tobacco  . Never Used    History  Alcohol Use  . Yes    Comment: "used to drink a little beer occasionally; never was a drinker; nothing now"    Family History  Problem Relation Age of Onset  . Diabetes type II Mother   . Heart attack Father   . Coronary artery disease Cousin   . Heart disease Brother     Review of Systems: The review of systems is per the HPI.  All other systems were reviewed and are negative.  Physical Exam: BP 118/72  Pulse 61  Ht 5' 9.5" (1.765 m)  Wt 203 lb (92.08 kg)  BMI 29.56 kg/m2  SpO2 95% Patient is very pleasant and in no acute distress. Skin is warm and dry. Color is normal.  HEENT is unremarkable. Normocephalic/atraumatic. PERRL. Sclera are nonicteric. Neck is supple. No masses. No JVD. Lungs are clear. Cardiac exam shows a regular rate and rhythm. Abdomen is soft. Extremities are without edema. Gait and ROM are intact. No gross neurologic deficits noted.  Wt Readings from Last 3 Encounters:  08/12/13 203 lb (92.08 kg)  07/04/13 201 lb 1.9 oz (91.227 kg)  06/27/13  204 lb 5.9 oz (92.7 kg)    LABORATORY  DATA:  Lab Results  Component Value Date   WBC 4.5 06/27/2013   HGB 13.0 06/27/2013   HCT 36.9* 06/27/2013   PLT 156 06/27/2013   GLUCOSE 144* 06/27/2013   CHOL 123 04/11/2011   TRIG 119.0 04/11/2011   HDL 41.60 04/11/2011   LDLCALC 58 04/11/2011   ALT 28 06/27/2013   AST 17 06/27/2013   NA 140 06/27/2013   K 4.3 06/27/2013   CL 102 06/27/2013   CREATININE 0.95 06/27/2013   BUN 14 06/27/2013   CO2 26 06/27/2013   INR 1.05 06/25/2013   HGBA1C 8.4* 06/26/2013    BNP (last 3 results)  Recent Labs  06/25/13 1902  PROBNP 30.0    Procedure Performed:  1. Left Heart Catheterization 2. Selective Coronary Angiography 3. Left ventricular angiogram 4. PTCA/DES x 3 mid LAD Operator: Lauree Chandler, MD  Arterial access site: Right radial artery.  Indication: 68 yo male with history of DM, HTN, HLD with recent unstable angina (class III), abnormal stress myoview with anterior wall ischemia.  Procedure Details:  The risks, benefits, complications, treatment options, and expected outcomes were discussed with the patient. The patient and/or family concurred with the proposed plan, giving informed consent. The patient was brought to the cath lab after IV hydration was begun and oral premedication was given. The patient was further sedated with Versed and Fentanyl. The right wrist was assessed with an Allens test which was positive. The right wrist was prepped and draped in a sterile fashion. 1% lidocaine was used for local anesthesia. Using the modified Seldinger access technique, a 5/6 French sheath was placed in the right radial artery. 3 mg Verapamil was given through the sheath. 5000 units IV heparin was given. Standard diagnostic catheters were used to perform selective coronary angiography. A pigtail catheter was used to perform a left ventricular angiogram. He was found to have severe stenosis in the calcified, diffusely diseased mid LAD. I elected to proceed to PCI of the LAD.  PCI Note: The left  main was engaged with a XB LAD 3.5 guiding catheter. He was given Plavix 600 mg po x 1. He was given additional IV heparin and ACT was over 250. Total Heparin IV during case was 14,000 units. I then passed a Cougar IC wire down the LAD. I could not pass a balloon beyond the mid vessel so I used another Cougar IC wire as a buddy wire for support. I was able to pass a 1.5 x 15 mm balloon into the distal vessel and performed several balloon inflations. I then passed a 2.0 x 20 mm balloon into the mid vessel and performed one inflation. I could not pass a stent down the vessel and I had poor guide support. I changed out for an EBU 3.5 guide and passed another Cougar IC wire and a Mailman wire down the LAD. I then used a 2.0 x 12 mm balloon to pre-dilate the entire mid vessel. I was then able to deliver stents. I placed a 2.25 x 18 mm Resolute Integrity DES, followed by an overlapping 2.25 x 18 mm Resolute Integrity DES in the mid vessel and then a 2.25 x 22 mm Resolute Integrity DES in the most proximal segment of the mid vessel. The stents were post-dilated with a 2.25 x 20 mm Dorrance balloon x 3. The stenosis was taken from 99% down to 0%.  The sheath was removed from the right radial artery  and a Terumo hemostasis band was applied at the arteriotomy site on the right wrist. There were no immediate complications. The patient was taken to the recovery area in stable condition.  Hemodynamic Findings:  Central aortic pressure: 142/72  Left ventricular pressure: 143/10/19  Angiographic Findings:  Left main: Ostial 30-40% stenosis.  Left Anterior Descending Artery: Moderate caliber vessel that courses to the apex. There is an ostial 40% calcified stenosis. The proximal vessel has diffuse 20% stenosis. The mid vessel is moderately calcified with diffuse 99% stenosis. The distal vessel has diffuse 50% stenosis. The entire vessel is diffusely diseased. The first diagonal branch is small in caliber with proximal 50%  stenosis.  Circumflex Artery: Moderate caliber vessel with 20% proximal stenosis, 60% ostium of continuation of the AV groove Circumflex beyond the takeoff of the first OM branch. The first OM branch has a focal 50% stenosis.  Right Coronary Artery: Large caliber dominant calcified vessel with proximal 20% stenosis, mid 40% stenosis, distal 30% stenosis. The ostium of the posterolateral branch has 40% stenosis. The ostium of the PDA has 50% stenosis.  Left Ventricular Angiogram: LVEF=55-60%   Impression:  1. Unstable angina  2. Severe stenosis in the diffusely diseased mid LAD, now s/p successful PTCA/DES x 3 mid LAD  3. Moderate non-obstructive disease in the Circumflex and RCA  4. Normal LV systolic function  Recommendations: He will need dual anti-platelet therapy with ASA and Plavix for at least one year. Given his diabetes and diffuse CAD, would recommend dual antiplatelet therapy for lifetime. Home in am if stable.    Procedure: Left Heart Cath, Selective Coronary Angiography, LV angiography, pressure wire interrogation of the left main coronary artery  Indication: Unstable angina with recent stenting of the LAD  Medications:  Sedation: 2 mg IV Versed, 75 mcg IV Fentanyl  Contrast: 90 Omnipaque  Procedural Details: The right wrist was prepped, draped, and anesthetized with 1% lidocaine. Using the modified Seldinger technique, a 5 French Slender sheath was introduced into the right radial artery. 3 mg of verapamil was administered through the sheath, weight-based unfractionated heparin was administered intravenously. A Jackie catheter was used for selective coronary angiography and left ventriculography. Catheter exchanges were performed over an exchange length guidewire. There were no immediate procedural complications.  Procedural Findings:  Hemodynamics:  AO: 135/5 her6 mmHg  LV: 131/7 mmHg  LVEDP: 16 mmHg  Coronary angiography:  Coronary dominance: Right  Left Main: Mildly  calcified with 30-40% ostial stenosis  Left Anterior Descending (LAD): Heavily calcified with minor irregularities proximally. Overlapped stents are patent in the mid and distal area with no significant restenosis. The second septal perforator is jailed by the stent with 80-90% ostial stenosis but normal flow. In the distal LAD close to the apex, there is a 80% stenosis  1st diagonal (D1): Small in size with 70% ostial stenosis.  2nd diagonal (D2): This is a 1.5 mm vessel although it does extend distally to a large portion of the lateral wall. There is 90% ostial and proximal stenosis.  3rd diagonal (D3): Very small in size.  Circumflex (LCx): Normal in size, mildly calcified and nondominant. There is 40-50% stenosis in the midsegment after the origin of OM1.  1st obtuse marginal: Large in size with 50-60% proximal stenosis.  2nd obtuse marginal: very small in size  3rd obtuse marginal: Very small in size.  Ramus Intermedius: I very small in size with 95% ostial stenosis.  Right Coronary Artery: Large in size and dominant. I the vessel  is moderately calcified. There is diffuse 20% disease in the proximal and midsegment.  Posterior descending artery: That a 60% ostial stenosis.  Posterior AV segment: Was 50-60% ostial stenosis  Posterolateral branchs: Minor irregularities. Left ventriculography: Left ventricular systolic function is normal , LVEF is estimated at 55 %,  pressure wire interrogation Note: Following the diagnostic procedure, the decision was made to proceed with pressure wire interrogation of the left main coronary artery. ACT was 256 with heparin. Once a therapeutic ACT was achieved, a 6 [place] JL 3.5 guide catheter was inserted. A volcano prime wire was used in the standard fashion. This was advanced into OM 1. The patient was started on intravenous adenosine at 140 mcg per kilogram per minute. Continuous recording of pressure was performed. FFR ratio was 0.93 with a resting pressure of  0.96. This indicated no physiologic significance. The wire and guiding catheters were removed. The patient tolerated the procedure well. There were no immediate procedural complications. A TR band was used for radial hemostasis. The patient was transferred to the post catheterization recovery area for further monitoring.  Final Conclusions:  1. Diffuse borderline significant three-vessel coronary artery disease with significant small branch disease. The stents in the LAD are patent. The disease in the RCA and left circumflex is unchanged. The patient might be having angina from the jailed second septal perforator. He is also probably ischemic in the second diagonal territory and other small branches as well as distal LAD. However, these branches are 1.5 mm or less in diameter.  2. Normal LV systolic function and mildly elevated left ventricular end-diastolic pressure.  3. Mild ostial left main stenosis which was not significant by pressure wire interrogation.  Recommendations:  Recommend optimizing medical therapy. He did not tolerate Plake-acting nitroglycerin due to headache. I added Ranexa. Monitor today and ambulate. Possible discharge in the morning.  [name] MD, [facility]  06/26/2013, 9:44 AM    Assessment / Plan:  1. CAD with recent history of unstable angina - abnormal Myoview followed by cath with DES x 3 to the LAD - has moderate nonobstructive disease otherwise - has had recurrent chest pain - and was recathed - will be managed medically. He is doing very well on his current regimen. See back in 3 months. Refer to cardiac rehab.  2. HTN - BP is great.   3. HLD - on statin   4. DM - followed by Dr. Joylene Draft.   Patient is agreeable to this plan and will call if any problems develop in the interim.   Burtis Junes, RN, [place]  [address] [place]  [place], [place] [address]  [phone]

## 2013-09-06 ENCOUNTER — Telehealth: Payer: Self-pay | Admitting: *Deleted

## 2013-09-06 NOTE — Telephone Encounter (Signed)
Coming from the beach.  He hit his toe on a beach chair and knocked the toenail loose.  We'd like to get him in this afternoon to see a doctor.  He's diabetic, it has bleed quite a bit.  Give me a call back.  I called and left her a message.  I apologized for the delay in calling.  I advised him to soak in a warm betadine or epsom salt solution and apply antibiotic ointment twice a day if can tolerate it.  Call us on Monday to schedule an appointment.  Advised to go to an [facility] if can't wait until Monday.

## 2013-09-30 DIAGNOSIS — Z6830 Body mass index (BMI) 30.0-30.9, adult: Secondary | ICD-10-CM | POA: Diagnosis not present

## 2013-09-30 DIAGNOSIS — E119 Type 2 diabetes mellitus without complications: Secondary | ICD-10-CM | POA: Diagnosis not present

## 2013-09-30 DIAGNOSIS — I2510 Atherosclerotic heart disease of native coronary artery without angina pectoris: Secondary | ICD-10-CM | POA: Diagnosis not present

## 2013-09-30 DIAGNOSIS — Z79899 Other long term (current) drug therapy: Secondary | ICD-10-CM | POA: Diagnosis not present

## 2013-09-30 DIAGNOSIS — G4733 Obstructive sleep apnea (adult) (pediatric): Secondary | ICD-10-CM | POA: Diagnosis not present

## 2013-09-30 DIAGNOSIS — E785 Hyperlipidemia, unspecified: Secondary | ICD-10-CM | POA: Diagnosis not present

## 2013-09-30 DIAGNOSIS — I10 Essential (primary) hypertension: Secondary | ICD-10-CM | POA: Diagnosis not present

## 2013-10-07 ENCOUNTER — Ambulatory Visit (INDEPENDENT_AMBULATORY_CARE_PROVIDER_SITE_OTHER): Payer: Medicare Other | Admitting: Nurse Practitioner

## 2013-10-07 ENCOUNTER — Encounter: Payer: Self-pay | Admitting: Nurse Practitioner

## 2013-10-07 VITALS — BP 140/80 | HR 62 | Ht 69.5 in | Wt 205.0 lb

## 2013-10-07 DIAGNOSIS — I10 Essential (primary) hypertension: Secondary | ICD-10-CM | POA: Diagnosis not present

## 2013-10-07 DIAGNOSIS — I2510 Atherosclerotic heart disease of native coronary artery without angina pectoris: Secondary | ICD-10-CM

## 2013-10-07 DIAGNOSIS — I259 Chronic ischemic heart disease, unspecified: Secondary | ICD-10-CM

## 2013-10-07 DIAGNOSIS — E785 Hyperlipidemia, unspecified: Secondary | ICD-10-CM | POA: Diagnosis not present

## 2013-10-07 MED ORDER — CLOPIDOGREL BISULFATE 75 MG PO TABS: 75.0000 mg | ORAL_TABLET | Freq: Every day | ORAL | Status: DC

## 2013-10-07 NOTE — Progress Notes (Signed)
Hunter Mcbride Date of Birth: 03/05/1945 Medical Record #[id number]  History of Present Illness: Hunter Mcbride is seen back today for a 2 month check. Seen for Dr. Acie Fredrickson. His wife is a former patient of Dr. Susa Simmonds that I have taken care of. Hunter Mcbride has HTN, HLD, DM and OSA. Past normal Myoview study.   He most recently presented with a 2 month history of exertional chest pain - had abnormal Myoview - then cathed and had DES x 3 to the LAD. Otherwise had moderate but nonobstructive disease With a normal EF of 55 to 60%. He was discharged 06/22/13. On Plavix.   Has been back here and to the ER with chest pain - referred back for repeat cath - to continue with medical management. Ranexa added. He did not tolerate Caradonna acting NTG.   I saw him at the end of June - he was doing well.   Comes back today. Here with his wife - Hunter Mcbride. Needs his Plavix refilled. A1C was 7.5. Lipids pending per Dr. Joylene Draft. Saw Dr. Joylene Draft last Monday. Few twinges of chest pain - nothing that he needed NTG. Not really with exertion. Very short lived in duration. Overall still happy with how he is doing. No symptoms when he exerts himself. Pretty physical last week - did so without issue. Does get tired by the end of the day. Still has issues with his Metformin but they have agreed for him to continue until December. He really feels this is his most limiting factor is the stomach discomfort.   Current Outpatient Prescriptions  Medication Sig Dispense Refill  . acetaminophen (TYLENOL ARTHRITIS PAIN) 650 MG CR tablet Take 1,300 mg by mouth 2 (two) times daily as needed. For pain      . amLODipine (NORVASC) 5 MG tablet Take 1 tablet (5 mg total) by mouth daily.  90 tablet  3  . aspirin 81 MG tablet Take 81 mg by mouth at bedtime.       [name] atenolol (TENORMIN) 50 MG tablet Take 1 tablet (50 mg total) by mouth daily.  90 tablet  3  . atorvastatin (LIPITOR) 80 MG tablet Take 80 mg by mouth daily.      .  chlorpheniramine-HYDROcodone (TUSSIONEX) 10-8 MG/5ML LQCR Take 5 mLs by mouth every 12 (twelve) hours as needed for cough.      . cimetidine (TAGAMET) 200 MG tablet Take 200 mg by mouth daily as needed (for heartburn).      . clopidogrel (PLAVIX) 75 MG tablet Take 1 tablet (75 mg total) by mouth daily with breakfast.  90 tablet  0  . gabapentin (NEURONTIN) 300 MG capsule Take 300 mg by mouth at bedtime.        . hydrochlorothiazide (HYDRODIURIL) 25 MG tablet Take 25 mg by mouth daily.       . irbesartan (AVAPRO) 300 MG tablet Take 150 mg by mouth daily.       [name] LORazepam (ATIVAN) 0.5 MG tablet Take 0.5 mg by mouth as needed for anxiety.       . metFORMIN (GLUCOPHAGE-XR) 500 MG 24 hr tablet Take 500 mg by mouth 2 (two) times daily. 1 tablet in the am 2 tablets in the pm      . Multiple Vitamin (MULTIVITAMIN WITH MINERALS) TABS tablet Take 1 tablet by mouth daily.      . nitroGLYCERIN (NITROSTAT) 0.4 MG SL tablet Place 1 tablet (0.4 mg total) under the tongue every 5 (five) minutes as  needed. For chest pain  25 tablet  5  . ONGLYZA 5 MG TABS tablet Take 5 mg by mouth daily.       . pantoprazole (PROTONIX) 40 MG tablet Take 40 mg by mouth daily.      . ranolazine (RANEXA) 500 MG 12 hr tablet Take 1 tablet (500 mg total) by mouth 2 (two) times daily.  180 tablet  3  . traMADol (ULTRAM) 50 MG tablet Take 50 mg by mouth every 6 (six) hours as needed (for pain).        No current facility-administered medications for this visit.    No Known Allergies  Past Medical History  Diagnosis Date  . Chest pain   . Hyperlipidemia   . Hypertension   . Gout     "maybe twice in my life"  . GERD (gastroesophageal reflux disease)   . Myocardial infarction     "/Dr. Katharina Caper I've had one between 2014-2015) (06/21/2013)  . Asthma     "not sure if this is true or not" (06/21/2013)  . OSA on CPAP   . Type II diabetes mellitus   . Headache(784.0)     "weekly for the last 3-4 months" (06/21/2013)  . Arthritis      "lower back; right shoulder; left thumb; joints" (06/21/2013)  . Osteoarthritis   . CAD (coronary artery disease) May 2015    s/p PTCA/DES x 3 to mid LAD, mod non-ob disease in Cx and RCA May 2015    Past Surgical History  Procedure Laterality Date  . Cardiovascular stress test  06/10/2008    EF 68%  . Carpal tunnel release Bilateral   . Excision morton's neuroma Left   . Tonsillectomy    . Eye surgery Left     "removed film over my eye"  . Total shoulder arthroplasty  03/01/2012    Procedure: TOTAL SHOULDER ARTHROPLASTY;  Surgeon: Marin Shutter, MD;  Location: [facility];  Service: Orthopedics;  Laterality: Right;  . Cardiac catheterization  1980's    "once"  . Coronary angioplasty with stent placement  06/21/2013    "3"  . Hernia repair Left   . Umbilical hernia repair    . Joint replacement Left 03/2013    "thumb"    History  Smoking status  . Never Smoker   Smokeless tobacco  . Never Used    History  Alcohol Use  . Yes    Comment: "used to drink a little beer occasionally; never was a drinker; nothing now"    Family History  Problem Relation Age of Onset  . Diabetes type II Mother   . Heart attack Father   . Coronary artery disease Cousin   . Heart disease Brother     Review of Systems: The review of systems is per the HPI.  All other systems were reviewed and are negative.  Physical Exam: BP 140/80  Pulse 62  Ht 5' 9.5" (1.765 m)  Wt 205 lb (92.987 kg)  BMI 29.85 kg/m2  SpO2 98% Patient is very pleasant and in no acute distress. Skin is warm and dry. Color is normal.  HEENT is unremarkable. Normocephalic/atraumatic. PERRL. Sclera are nonicteric. Neck is supple. No masses. No JVD. Lungs are clear. Cardiac exam shows a regular rate and rhythm. Abdomen is soft. Extremities are without edema. Gait and ROM are intact. No gross neurologic deficits noted.  Wt Readings from Last 3 Encounters:  10/07/13 205 lb (92.987 kg)  08/12/13 203 lb (92.08 kg)  07/04/13 201 lb  1.9 oz (91.227 kg)    LABORATORY DATA/PROCEDURES:  Lab Results  Component Value Date   WBC 4.5 06/27/2013   HGB 13.0 06/27/2013   HCT 36.9* 06/27/2013   PLT 156 06/27/2013   GLUCOSE 144* 06/27/2013   CHOL 123 04/11/2011   TRIG 119.0 04/11/2011   HDL 41.60 04/11/2011   LDLCALC 58 04/11/2011   ALT 28 06/27/2013   AST 17 06/27/2013   NA 140 06/27/2013   K 4.3 06/27/2013   CL 102 06/27/2013   CREATININE 0.95 06/27/2013   BUN 14 06/27/2013   CO2 26 06/27/2013   INR 1.05 06/25/2013   HGBA1C 8.4* 06/26/2013    BNP (last 3 results)  Recent Labs  06/25/13 1902  PROBNP 30.0   Procedure Performed:  1. Left Heart Catheterization 2. Selective Coronary Angiography 3. Left ventricular angiogram 4. PTCA/DES x 3 mid LAD Operator: Lauree Chandler, MD  Arterial access site: Right radial artery.  Indication: 68 yo male with history of DM, HTN, HLD with recent unstable angina (class III), abnormal stress myoview with anterior wall ischemia.  Procedure Details:  The risks, benefits, complications, treatment options, and expected outcomes were discussed with the patient. The patient and/or family concurred with the proposed plan, giving informed consent. The patient was brought to the cath lab after IV hydration was begun and oral premedication was given. The patient was further sedated with Versed and Fentanyl. The right wrist was assessed with an Allens test which was positive. The right wrist was prepped and draped in a sterile fashion. 1% lidocaine was used for local anesthesia. Using the modified Seldinger access technique, a 5/6 French sheath was placed in the right radial artery. 3 mg Verapamil was given through the sheath. 5000 units IV heparin was given. Standard diagnostic catheters were used to perform selective coronary angiography. A pigtail catheter was used to perform a left ventricular angiogram. He was found to have severe stenosis in the calcified, diffusely diseased mid LAD. I elected to  proceed to PCI of the LAD.  PCI Note: The left main was engaged with a XB LAD 3.5 guiding catheter. He was given Plavix 600 mg po x 1. He was given additional IV heparin and ACT was over 250. Total Heparin IV during case was 14,000 units. I then passed a Cougar IC wire down the LAD. I could not pass a balloon beyond the mid vessel so I used another Cougar IC wire as a buddy wire for support. I was able to pass a 1.5 x 15 mm balloon into the distal vessel and performed several balloon inflations. I then passed a 2.0 x 20 mm balloon into the mid vessel and performed one inflation. I could not pass a stent down the vessel and I had poor guide support. I changed out for an EBU 3.5 guide and passed another Cougar IC wire and a Mailman wire down the LAD. I then used a 2.0 x 12 mm balloon to pre-dilate the entire mid vessel. I was then able to deliver stents. I placed a 2.25 x 18 mm Resolute Integrity DES, followed by an overlapping 2.25 x 18 mm Resolute Integrity DES in the mid vessel and then a 2.25 x 22 mm Resolute Integrity DES in the most proximal segment of the mid vessel. The stents were post-dilated with a 2.25 x 20 mm Longbranch balloon x 3. The stenosis was taken from 99% down to 0%.  The sheath was removed from the right radial artery  and a Terumo hemostasis band was applied at the arteriotomy site on the right wrist. There were no immediate complications. The patient was taken to the recovery area in stable condition.  Hemodynamic Findings:  Central aortic pressure: 142/72  Left ventricular pressure: 143/10/19  Angiographic Findings:  Left main: Ostial 30-40% stenosis.  Left Anterior Descending Artery: Moderate caliber vessel that courses to the apex. There is an ostial 40% calcified stenosis. The proximal vessel has diffuse 20% stenosis. The mid vessel is moderately calcified with diffuse 99% stenosis. The distal vessel has diffuse 50% stenosis. The entire vessel is diffusely diseased. The first diagonal  branch is small in caliber with proximal 50% stenosis.  Circumflex Artery: Moderate caliber vessel with 20% proximal stenosis, 60% ostium of continuation of the AV groove Circumflex beyond the takeoff of the first OM branch. The first OM branch has a focal 50% stenosis.  Right Coronary Artery: Large caliber dominant calcified vessel with proximal 20% stenosis, mid 40% stenosis, distal 30% stenosis. The ostium of the posterolateral branch has 40% stenosis. The ostium of the PDA has 50% stenosis.  Left Ventricular Angiogram: LVEF=55-60%  Impression:  1. Unstable angina  2. Severe stenosis in the diffusely diseased mid LAD, now s/p successful PTCA/DES x 3 mid LAD  3. Moderate non-obstructive disease in the Circumflex and RCA  4. Normal LV systolic function  Recommendations: He will need dual anti-platelet therapy with ASA and Plavix for at least one year. Given his diabetes and diffuse CAD, would recommend dual antiplatelet therapy for lifetime. Home in am if stable.    Procedure: Left Heart Cath, Selective Coronary Angiography, LV angiography, pressure wire interrogation of the left main coronary artery  Indication: Unstable angina with recent stenting of the LAD  Medications:  Sedation: 2 mg IV Versed, 75 mcg IV Fentanyl  Contrast: 90 Omnipaque  Procedural Details: The right wrist was prepped, draped, and anesthetized with 1% lidocaine. Using the modified Seldinger technique, a 5 French Slender sheath was introduced into the right radial artery. 3 mg of verapamil was administered through the sheath, weight-based unfractionated heparin was administered intravenously. A Jackie catheter was used for selective coronary angiography and left ventriculography. Catheter exchanges were performed over an exchange length guidewire. There were no immediate procedural complications.  Procedural Findings:  Hemodynamics:  AO: 135/5 her6 mmHg  LV: 131/7 mmHg  LVEDP: 16 mmHg  Coronary angiography:  Coronary  dominance: Right  Left Main: Mildly calcified with 30-40% ostial stenosis  Left Anterior Descending (LAD): Heavily calcified with minor irregularities proximally. Overlapped stents are patent in the mid and distal area with no significant restenosis. The second septal perforator is jailed by the stent with 80-90% ostial stenosis but normal flow. In the distal LAD close to the apex, there is a 80% stenosis  1st diagonal (D1): Small in size with 70% ostial stenosis.  2nd diagonal (D2): This is a 1.5 mm vessel although it does extend distally to a large portion of the lateral wall. There is 90% ostial and proximal stenosis.  3rd diagonal (D3): Very small in size.  Circumflex (LCx): Normal in size, mildly calcified and nondominant. There is 40-50% stenosis in the midsegment after the origin of OM1.  1st obtuse marginal: Large in size with 50-60% proximal stenosis.  2nd obtuse marginal: very small in size  3rd obtuse marginal: Very small in size.  Ramus Intermedius: I very small in size with 95% ostial stenosis.  Right Coronary Artery: Large in size and dominant. I the vessel is  moderately calcified. There is diffuse 20% disease in the proximal and midsegment.  Posterior descending artery: That a 60% ostial stenosis.  Posterior AV segment: Was 50-60% ostial stenosis  Posterolateral branchs: Minor irregularities. Left ventriculography: Left ventricular systolic function is normal , LVEF is estimated at 55 %,  pressure wire interrogation Note: Following the diagnostic procedure, the decision was made to proceed with pressure wire interrogation of the left main coronary artery. ACT was 256 with heparin. Once a therapeutic ACT was achieved, a 6 [place] JL 3.5 guide catheter was inserted. A volcano prime wire was used in the standard fashion. This was advanced into OM 1. The patient was started on intravenous adenosine at 140 mcg per kilogram per minute. Continuous recording of pressure was performed. FFR ratio  was 0.93 with a resting pressure of 0.96. This indicated no physiologic significance. The wire and guiding catheters were removed. The patient tolerated the procedure well. There were no immediate procedural complications. A TR band was used for radial hemostasis. The patient was transferred to the post catheterization recovery area for further monitoring.  Final Conclusions:  1. Diffuse borderline significant three-vessel coronary artery disease with significant small branch disease. The stents in the LAD are patent. The disease in the RCA and left circumflex is unchanged. The patient might be having angina from the jailed second septal perforator. He is also probably ischemic in the second diagonal territory and other small branches as well as distal LAD. However, these branches are 1.5 mm or less in diameter.  2. Normal LV systolic function and mildly elevated left ventricular end-diastolic pressure.  3. Mild ostial left main stenosis which was not significant by pressure wire interrogation.  Recommendations:  Recommend optimizing medical therapy. He did not tolerate Legault-acting nitroglycerin due to headache. I added Ranexa. Monitor today and ambulate. Possible discharge in the morning.  [name] MD, [facility]  06/26/2013, 9:44 AM    Assessment / Plan:  1. CAD with recent history of unstable angina - abnormal Myoview followed by cath with DES x 3 to the LAD - has moderate nonobstructive disease otherwise - has had recurrent chest pain - and was recathed - will be managed medically. He is doing very well on his current regimen. No symptoms with exertion. See back in 4 months.   2. HTN - BP is satisfactory. No change with his current regimen.  3. HLD - on statin and has had recent labs checked.  4. DM - followed by Dr. Joylene Draft.   Patient is agreeable to this plan and will call if any problems develop in the interim.   Burtis Junes, RN, [facility] [address] [place], [place]  [id number] [phone]

## 2013-10-07 NOTE — Patient Instructions (Signed)
Continue with your current medicines  See me in 4 months  Call the [place] office at [phone] if you have any questions, problems or concerns.

## 2013-11-21 DIAGNOSIS — Z23 Encounter for immunization: Secondary | ICD-10-CM | POA: Diagnosis not present

## 2013-11-29 ENCOUNTER — Other Ambulatory Visit: Payer: Self-pay

## 2013-12-23 DIAGNOSIS — R072 Precordial pain: Secondary | ICD-10-CM | POA: Diagnosis not present

## 2013-12-23 DIAGNOSIS — Z7902 Long term (current) use of antithrombotics/antiplatelets: Secondary | ICD-10-CM | POA: Diagnosis not present

## 2013-12-23 DIAGNOSIS — R0789 Other chest pain: Secondary | ICD-10-CM | POA: Diagnosis not present

## 2013-12-23 DIAGNOSIS — R079 Chest pain, unspecified: Secondary | ICD-10-CM | POA: Diagnosis not present

## 2013-12-23 DIAGNOSIS — R9431 Abnormal electrocardiogram [ECG] [EKG]: Secondary | ICD-10-CM | POA: Diagnosis not present

## 2013-12-24 DIAGNOSIS — R9431 Abnormal electrocardiogram [ECG] [EKG]: Secondary | ICD-10-CM | POA: Diagnosis not present

## 2013-12-24 DIAGNOSIS — R072 Precordial pain: Secondary | ICD-10-CM | POA: Diagnosis not present

## 2013-12-24 DIAGNOSIS — I200 Unstable angina: Secondary | ICD-10-CM | POA: Diagnosis not present

## 2013-12-24 DIAGNOSIS — E1165 Type 2 diabetes mellitus with hyperglycemia: Secondary | ICD-10-CM | POA: Diagnosis not present

## 2013-12-24 DIAGNOSIS — E785 Hyperlipidemia, unspecified: Secondary | ICD-10-CM | POA: Diagnosis not present

## 2013-12-24 DIAGNOSIS — E782 Mixed hyperlipidemia: Secondary | ICD-10-CM | POA: Diagnosis not present

## 2013-12-24 DIAGNOSIS — I2510 Atherosclerotic heart disease of native coronary artery without angina pectoris: Secondary | ICD-10-CM | POA: Diagnosis not present

## 2013-12-24 DIAGNOSIS — I25110 Atherosclerotic heart disease of native coronary artery with unstable angina pectoris: Secondary | ICD-10-CM | POA: Diagnosis not present

## 2013-12-24 DIAGNOSIS — I10 Essential (primary) hypertension: Secondary | ICD-10-CM | POA: Diagnosis not present

## 2013-12-24 DIAGNOSIS — I440 Atrioventricular block, first degree: Secondary | ICD-10-CM | POA: Diagnosis not present

## 2013-12-24 DIAGNOSIS — E780 Pure hypercholesterolemia: Secondary | ICD-10-CM | POA: Diagnosis not present

## 2013-12-24 DIAGNOSIS — R079 Chest pain, unspecified: Secondary | ICD-10-CM | POA: Diagnosis not present

## 2013-12-30 ENCOUNTER — Telehealth: Payer: Self-pay | Admitting: Cardiovascular Disease

## 2013-12-30 NOTE — Telephone Encounter (Signed)
New Message:  Pt 's wife is calling to get him in for a hosp f/u with [name] but there is nothing avalible until 11/30. Please call  Thanks

## 2013-12-30 NOTE — Telephone Encounter (Signed)
This is a patient of Dr. Elmarie Shiley.  Can you get his records from the recent cath and d/c summary.  Ok to move up his appointment with me.

## 2013-12-30 NOTE — Telephone Encounter (Signed)
SPOKE WITH PT'S  WIFE   PT WAS OUT OF TOWN ON  HUNTING TRIP WAS TAKEN TO ER  AND   CATHED     NO  NEW  BLOCKAGES   PT'S  RANEXA  WAS  INCREASED  DISCHARGE  INSTRUCTIONS  STATED  TO F/U WITH  CARDIOLOGISTS ASAP   PT  HAS  APPT  ON   01-14-14 [facility]   DOES  PT  NEED  AN EARLIER  APPT  ? PER  WIFE  PT  HAS  SMALL  KNOT  IN GROIN   INSTRUCTED  TO  CONT TO   MONITOR  AND  IF  GETS BIGGER  OR  NO CHANGE  AFTER   FEW  DAYS  TO CALL BACK  VERBALIZED UNDERSTANDING WILL FORWARD MESSAGE  TO [facility]  [facility]  .Adonis Housekeeper

## 2013-12-31 NOTE — Telephone Encounter (Signed)
S/w pt's wife will get records from hospital.  Stated has 3 discs with Cath information.  Will call to move up appointment when they receive records.  Pt is feeling okay except for a small egg at groin site.  Not red or hot and hasn't gotten any bigger.  Will FYI Truitt Merle, NP.

## 2014-01-01 ENCOUNTER — Telehealth: Payer: Self-pay | Admitting: *Deleted

## 2014-01-01 ENCOUNTER — Telehealth: Payer: Self-pay | Admitting: Nurse Practitioner

## 2014-01-01 NOTE — Telephone Encounter (Signed)
I stated I received medical records from [place] and s/w Truitt Merle, and as [place] as pt was doing ok will keep appointment for December 1 if not call back.

## 2014-01-01 NOTE — Telephone Encounter (Signed)
Signed authorization faxed to [facility] on 11.17.15 to obtain records for L Gerhardt Received 68 pages from [facility] on 11.18.15// given to Rainey Pines for [employer]

## 2014-01-02 ENCOUNTER — Other Ambulatory Visit: Payer: Self-pay

## 2014-01-02 DIAGNOSIS — L309 Dermatitis, unspecified: Secondary | ICD-10-CM | POA: Diagnosis not present

## 2014-01-02 DIAGNOSIS — L820 Inflamed seborrheic keratosis: Secondary | ICD-10-CM | POA: Diagnosis not present

## 2014-01-02 DIAGNOSIS — D485 Neoplasm of uncertain behavior of skin: Secondary | ICD-10-CM | POA: Diagnosis not present

## 2014-01-02 DIAGNOSIS — B079 Viral wart, unspecified: Secondary | ICD-10-CM | POA: Diagnosis not present

## 2014-01-02 DIAGNOSIS — D225 Melanocytic nevi of trunk: Secondary | ICD-10-CM | POA: Diagnosis not present

## 2014-01-14 ENCOUNTER — Ambulatory Visit: Payer: Medicare Other | Admitting: Nurse Practitioner

## 2014-01-14 ENCOUNTER — Encounter: Payer: Self-pay | Admitting: Nurse Practitioner

## 2014-01-14 ENCOUNTER — Ambulatory Visit (INDEPENDENT_AMBULATORY_CARE_PROVIDER_SITE_OTHER): Payer: Medicare Other | Admitting: Nurse Practitioner

## 2014-01-14 VITALS — BP 150/90 | HR 56 | Ht 69.0 in | Wt 201.1 lb

## 2014-01-14 DIAGNOSIS — E785 Hyperlipidemia, unspecified: Secondary | ICD-10-CM

## 2014-01-14 DIAGNOSIS — I10 Essential (primary) hypertension: Secondary | ICD-10-CM | POA: Diagnosis not present

## 2014-01-14 DIAGNOSIS — I259 Chronic ischemic heart disease, unspecified: Secondary | ICD-10-CM | POA: Diagnosis not present

## 2014-01-14 DIAGNOSIS — I2510 Atherosclerotic heart disease of native coronary artery without angina pectoris: Secondary | ICD-10-CM

## 2014-01-14 NOTE — Progress Notes (Addendum)
Hunter Mcbride Date of Birth: 03/04/1945 Medical Record #[id number]  History of Present Illness: Hunter Mcbride is seen back today for a follow up/post hospital visit. Seen for Dr. Acie Fredrickson. His wife is a former patient of Dr. Susa Simmonds that I have taken care of. Hunter Mcbride has CAD, HTN, HLD, DM and OSA.   He presented back in the spring with a 2 month history of exertional chest pain - had abnormal Myoview - then cathed and had DES x 3 to the LAD. Otherwise had moderate but nonobstructive disease With a normal EF of 55 to 60%. He was discharged 06/22/13. On Plavix. After his initial event - he has been back to the ER and has been recathed - to continue with medical management. He has not tolerated [place] active NTG.   I last saw him in August - was doing well from our standpoint.  Most recently traveled to [place] - went hunting with his son. He had been cutting trees and setting up stands. Developed SSCP that initially resolved but then returned, subsequently associated with SOB and nausea - ended up going to the hospital in [place] (after first presenting to an urgent care like facility that did not offer cardiac cath) - was recathed - actually twice. Had normal left heart pressures, patent stens extending from the proximal dow to the distal LAD and severe diffuse disease involving the branches of all 3 major epicardial vessels. No clear culprit lesion to intervene on - then had Myoview which showed normal perfusion and normal EF at 59%. Was recathed and had FFR of the first OM, posterior descending and distal RCA which were nonsignificant - no PCI was performed. CXR was normal. Echo was also updated - normal EF - mildly dilated aorta noted with mild LVH. I did not get a discharge summary - they brought a RX in for 1000 mg of Ranexa - this was not filled.  Comes back today. Here with his wife - Hunter Mcbride. He has reiterated his story again about what happened to him in [place]. He did probably over exert him self -  had walked 6 1/2 miles in the process of putting up the stands/cutting trees, etc. Had pretty classic chest pain associated with dyspnea and feeling of "impending doom". He notes that his symptoms seemed to abate after the second cath. He did take the higher dose of Ranexa but had some side effects (low BP, couldn't think, stomach upset, dizziness) and cut the dose back - now back to his prior dose.   Since his trip - he has not had any issues since Thanksgiving - had a lot of people in his house. Went hunting again. Lots of stress and 2 or 3 times had some fleeting pain. Now feels pretty good. He has brought his cath CDs and would like someone here to review.   Current Outpatient Prescriptions  Medication Sig Dispense Refill  . acetaminophen (TYLENOL ARTHRITIS PAIN) 650 MG CR tablet Take 1,300 mg by mouth 2 (two) times daily as needed. For pain    . amLODipine (NORVASC) 5 MG tablet Take 1 tablet (5 mg total) by mouth daily. 90 tablet 3  . aspirin 81 MG tablet Take 81 mg by mouth at bedtime.     [name] atenolol (TENORMIN) 50 MG tablet Take 1 tablet (50 mg total) by mouth daily. 90 tablet 3  . atorvastatin (LIPITOR) 80 MG tablet Take 80 mg by mouth daily.    . chlorpheniramine-HYDROcodone (TUSSIONEX) 10-8 MG/5ML [facility]  Take 5 mLs by mouth every 12 (twelve) hours as needed for cough.    . cimetidine (TAGAMET) 200 MG tablet Take 200 mg by mouth daily as needed (for heartburn).    . clopidogrel (PLAVIX) 75 MG tablet Take 1 tablet (75 mg total) by mouth daily with breakfast. 90 tablet 3  . gabapentin (NEURONTIN) 300 MG capsule Take 300 mg by mouth at bedtime.      . hydrochlorothiazide (HYDRODIURIL) 25 MG tablet Take 25 mg by mouth daily.     . irbesartan (AVAPRO) 300 MG tablet Take 150 mg by mouth daily.     [name] LORazepam (ATIVAN) 0.5 MG tablet Take 0.5 mg by mouth as needed for anxiety.     . metFORMIN (GLUCOPHAGE-XR) 500 MG 24 hr tablet Take 500 mg by mouth 2 (two) times daily. 1 tablet in the am 2 tablets in  the pm    . Multiple Vitamin (MULTIVITAMIN WITH MINERALS) TABS tablet Take 1 tablet by mouth daily.    . nitroGLYCERIN (NITROSTAT) 0.4 MG SL tablet Place 1 tablet (0.4 mg total) under the tongue every 5 (five) minutes as needed. For chest pain 25 tablet 5  . ONGLYZA 5 MG TABS tablet Take 5 mg by mouth daily.     . pantoprazole (PROTONIX) 40 MG tablet Take 40 mg by mouth daily.    . ranolazine (RANEXA) 500 MG 12 hr tablet Take 1 tablet (500 mg total) by mouth 2 (two) times daily. 180 tablet 3  . traMADol (ULTRAM) 50 MG tablet Take 50 mg by mouth every 6 (six) hours as needed (for pain).      No current facility-administered medications for this visit.    No Known Allergies  Past Medical History  Diagnosis Date  . Hyperlipidemia   . Hypertension   . Gout     "maybe twice in my life"  . GERD (gastroesophageal reflux disease)   . Myocardial infarction     "/Dr. Katharina Caper I've had one between 2014-2015) (06/21/2013)  . Asthma     "not sure if this is true or not" (06/21/2013)  . OSA on CPAP   . Type II diabetes mellitus   . Headache(784.0)     "weekly for the last 3-4 months" (06/21/2013)  . Arthritis     "lower back; right shoulder; left thumb; joints" (06/21/2013)  . Osteoarthritis   . CAD (coronary artery disease) May 2015    s/p PTCA/DES x 3 to mid LAD, mod non-ob disease in Cx and RCA May 2015; s/p repeat caths - last study in November of 2015 - stents patent, - FFR - managed medically.     Past Surgical History  Procedure Laterality Date  . Cardiovascular stress test  06/10/2008    EF 68%  . Carpal tunnel release Bilateral   . Excision morton's neuroma Left   . Tonsillectomy    . Eye surgery Left     "removed film over my eye"  . Total shoulder arthroplasty  03/01/2012    Procedure: TOTAL SHOULDER ARTHROPLASTY;  Surgeon: Marin Shutter, MD;  Location: [facility];  Service: Orthopedics;  Laterality: Right;  . Cardiac catheterization  1980's    "once"  . Coronary angioplasty with stent  placement  06/21/2013    "3"  . Hernia repair Left   . Umbilical hernia repair    . Joint replacement Left 03/2013    "thumb"    History  Smoking status  . Never Smoker   Smokeless tobacco  .  Never Used    History  Alcohol Use  . Yes    Comment: "used to drink a little beer occasionally; never was a drinker; nothing now"    Family History  Problem Relation Age of Onset  . Diabetes type II Mother   . Heart attack Father   . Coronary artery disease Cousin   . Heart disease Brother     Review of Systems: The review of systems is per the HPI.  All other systems were reviewed and are negative.  Physical Exam: BP 150/90 mmHg  Pulse 56  Ht 5\' 9"  (1.753 m)  Wt 201 lb 1.9 oz (91.227 kg)  BMI 29.69 kg/m2 Patient is very pleasant and in no acute distress. Skin is warm and dry. Color is normal.  HEENT is unremarkable. Normocephalic/atraumatic. PERRL. Sclera are nonicteric. Neck is supple. No masses. No JVD. Lungs are clear. Cardiac exam shows a regular rate and rhythm. Abdomen is soft. Extremities are without edema. Gait and ROM are intact. No gross neurologic deficits noted.  Wt Readings from Last 3 Encounters:  01/14/14 201 lb 1.9 oz (91.227 kg)  10/07/13 205 lb (92.987 kg)  08/12/13 203 lb (92.08 kg)    LABORATORY DATA/PROCEDURES:  Lab Results  Component Value Date   WBC 4.5 06/27/2013   HGB 13.0 06/27/2013   HCT 36.9* 06/27/2013   PLT 156 06/27/2013   GLUCOSE 144* 06/27/2013   CHOL 123 04/11/2011   TRIG 119.0 04/11/2011   HDL 41.60 04/11/2011   LDLCALC 58 04/11/2011   ALT 28 06/27/2013   AST 17 06/27/2013   NA 140 06/27/2013   K 4.3 06/27/2013   CL 102 06/27/2013   CREATININE 0.95 06/27/2013   BUN 14 06/27/2013   CO2 26 06/27/2013   INR 1.05 06/25/2013   HGBA1C 8.4* 06/26/2013    BNP (last 3 results)  Recent Labs  06/25/13 1902  PROBNP 30.0     Assessment / Plan:  1. CAD with recent history of unstable angina - with recent exacerbation while  hunting in [place] - s/p repeat caths with FFR - no lesions intervened on. To continue with medical management. He has improved clinically. Will see back in 4 to 6 weeks to check on him. Will get Dr. Acie Fredrickson and Dr. Burt Knack to review the films.   2. HTN - recheck by me is 150/80 - he will monitor but has had better control at home.   3. HLD - on statin   4. DM - followed by Dr. Joylene Draft.   5. Mildly dilated aortic root/ascending aorta - not seen on our last echo from May 2015 - would follow.  Patient is agreeable to this plan and will call if any problems develop in the interim.   Burtis Junes, RN, [place] [address] [place], [place]  [address] [phone]  Addendum 01/17/2014:  His cath CD films have been reviewed by Dr. Burt Knack - he agrees with continued medical therapy for his CAD. These films have been loaded into the [facility] system.  Burtis Junes, RN, [place] [address] [place], [place]  [address] [phone]

## 2014-01-14 NOTE — Patient Instructions (Addendum)
Stay on your current medicines  I will get Dr. Burt Knack and Dr. Acie Fredrickson to look at your cath films  See me in 4 to 6 weeks  Call the [place] office at [phone] if you have any questions, problems or concerns.

## 2014-01-15 ENCOUNTER — Telehealth: Payer: Self-pay | Admitting: *Deleted

## 2014-01-15 DIAGNOSIS — Z6829 Body mass index (BMI) 29.0-29.9, adult: Secondary | ICD-10-CM | POA: Diagnosis not present

## 2014-01-15 DIAGNOSIS — E119 Type 2 diabetes mellitus without complications: Secondary | ICD-10-CM | POA: Diagnosis not present

## 2014-01-15 DIAGNOSIS — I10 Essential (primary) hypertension: Secondary | ICD-10-CM | POA: Diagnosis not present

## 2014-01-15 DIAGNOSIS — E785 Hyperlipidemia, unspecified: Secondary | ICD-10-CM | POA: Diagnosis not present

## 2014-01-15 DIAGNOSIS — I2510 Atherosclerotic heart disease of native coronary artery without angina pectoris: Secondary | ICD-10-CM | POA: Diagnosis not present

## 2014-01-15 NOTE — Telephone Encounter (Signed)
S/w pt aware I will be leaving Ranexa Samples ( 500 ) mg up front.  Pt was appreciative and will pick up today.

## 2014-01-17 ENCOUNTER — Telehealth: Payer: Self-pay | Admitting: *Deleted

## 2014-01-17 NOTE — Telephone Encounter (Signed)
S/w pt let pt know Dr.Cooper reviewed CD's and will continue medical management. Pt agreeable to plan.

## 2014-01-17 NOTE — Telephone Encounter (Signed)
-----   Message from Burtis Junes, NP sent at 01/17/2014  8:25 AM EST ----- Can you call him and let him know that Dr. Burt Knack has looked at his films and agrees with continued medical management for his coronary disease.  [name]

## 2014-01-23 ENCOUNTER — Encounter (HOSPITAL_COMMUNITY): Payer: Self-pay | Admitting: Cardiovascular Disease

## 2014-02-25 ENCOUNTER — Ambulatory Visit: Payer: Medicare Other | Admitting: Nurse Practitioner

## 2014-03-03 ENCOUNTER — Encounter: Payer: Self-pay | Admitting: Nurse Practitioner

## 2014-03-03 DIAGNOSIS — E119 Type 2 diabetes mellitus without complications: Secondary | ICD-10-CM | POA: Diagnosis not present

## 2014-03-03 DIAGNOSIS — I10 Essential (primary) hypertension: Secondary | ICD-10-CM | POA: Diagnosis not present

## 2014-03-03 DIAGNOSIS — Z008 Encounter for other general examination: Secondary | ICD-10-CM | POA: Diagnosis not present

## 2014-03-03 DIAGNOSIS — Z125 Encounter for screening for malignant neoplasm of prostate: Secondary | ICD-10-CM | POA: Diagnosis not present

## 2014-03-03 DIAGNOSIS — E785 Hyperlipidemia, unspecified: Secondary | ICD-10-CM | POA: Diagnosis not present

## 2014-03-12 ENCOUNTER — Encounter: Payer: Self-pay | Admitting: Nurse Practitioner

## 2014-03-12 ENCOUNTER — Ambulatory Visit (INDEPENDENT_AMBULATORY_CARE_PROVIDER_SITE_OTHER): Payer: Medicare Other | Admitting: Nurse Practitioner

## 2014-03-12 VITALS — BP 130/78 | HR 77 | Ht 69.0 in | Wt 206.8 lb

## 2014-03-12 DIAGNOSIS — E785 Hyperlipidemia, unspecified: Secondary | ICD-10-CM

## 2014-03-12 DIAGNOSIS — E669 Obesity, unspecified: Secondary | ICD-10-CM | POA: Diagnosis not present

## 2014-03-12 DIAGNOSIS — Z1389 Encounter for screening for other disorder: Secondary | ICD-10-CM | POA: Diagnosis not present

## 2014-03-12 DIAGNOSIS — I10 Essential (primary) hypertension: Secondary | ICD-10-CM

## 2014-03-12 DIAGNOSIS — G4733 Obstructive sleep apnea (adult) (pediatric): Secondary | ICD-10-CM | POA: Diagnosis not present

## 2014-03-12 DIAGNOSIS — M1990 Unspecified osteoarthritis, unspecified site: Secondary | ICD-10-CM | POA: Diagnosis not present

## 2014-03-12 DIAGNOSIS — E119 Type 2 diabetes mellitus without complications: Secondary | ICD-10-CM | POA: Diagnosis not present

## 2014-03-12 DIAGNOSIS — Z0000 Encounter for general adult medical examination without abnormal findings: Secondary | ICD-10-CM | POA: Diagnosis not present

## 2014-03-12 DIAGNOSIS — F418 Other specified anxiety disorders: Secondary | ICD-10-CM | POA: Diagnosis not present

## 2014-03-12 DIAGNOSIS — H9190 Unspecified hearing loss, unspecified ear: Secondary | ICD-10-CM | POA: Diagnosis not present

## 2014-03-12 DIAGNOSIS — J45909 Unspecified asthma, uncomplicated: Secondary | ICD-10-CM | POA: Diagnosis not present

## 2014-03-12 DIAGNOSIS — I259 Chronic ischemic heart disease, unspecified: Secondary | ICD-10-CM | POA: Diagnosis not present

## 2014-03-12 DIAGNOSIS — E291 Testicular hypofunction: Secondary | ICD-10-CM | POA: Diagnosis not present

## 2014-03-12 DIAGNOSIS — I2510 Atherosclerotic heart disease of native coronary artery without angina pectoris: Secondary | ICD-10-CM | POA: Diagnosis not present

## 2014-03-12 DIAGNOSIS — Z6829 Body mass index (BMI) 29.0-29.9, adult: Secondary | ICD-10-CM | POA: Diagnosis not present

## 2014-03-12 MED ORDER — ATENOLOL 50 MG PO TABS: 50.0000 mg | ORAL_TABLET | Freq: Every day | ORAL | Status: DC

## 2014-03-12 MED ORDER — AMLODIPINE BESYLATE 5 MG PO TABS: 5.0000 mg | ORAL_TABLET | Freq: Every day | ORAL | Status: DC

## 2014-03-12 MED ORDER — RANOLAZINE ER 500 MG PO TB12: 500.0000 mg | ORAL_TABLET | Freq: Two times a day (BID) | ORAL | Status: DC

## 2014-03-12 MED ORDER — ISOSORBIDE MONONITRATE ER 30 MG PO TB24: 15.0000 mg | ORAL_TABLET | Freq: Every day | ORAL | Status: DC

## 2014-03-12 NOTE — Patient Instructions (Addendum)
Stay on your current medicines but lets try going back on the Imdur 30 mg - just half a tablet - take at night  I refilled your other medicines  I will see you in 3 months  Call the [place] office at [phone] if you have any questions, problems or concerns.

## 2014-03-12 NOTE — Progress Notes (Signed)
CARDIOLOGY OFFICE NOTE  Date:  03/12/2014    Hunter Mcbride Date of Birth: 1945/07/25 Medical Record #[id number]  PCP:  Jerlyn Ly, MD  Cardiologist:  Nahser    Chief Complaint  Patient presents with  . Coronary Artery Disease    6 week check - seen for Dr. Acie Fredrickson     History of Present Illness: Hunter Mcbride is a 68 y.o. male who presents today for a follow up visit. Seen for Dr. Acie Fredrickson. His wife is a former patient of Dr. Susa Simmonds that I have taken care of. Hunter Mcbride has CAD, HTN, HLD, DM and OSA.   He presented back in the spring with a 2 month history of exertional chest pain - had abnormal Myoview - then cathed and had DES x 3 to the LAD. Otherwise had moderate but nonobstructive disease With a normal EF of 55 to 60%. He was discharged 06/22/13. On Plavix. After his initial event - he has been back to the ER and has been recathed - to continue with medical management. He has not tolerated [place] active NTG.   I last saw him in August - was doing well from our standpoint.  Most recently traveled to [place] back in November - went hunting with his son. He had been cutting trees and setting up stands. Developed SSCP that initially resolved but then returned, subsequently associated with SOB and nausea - ended up going to the hospital in [place] (after first presenting to an urgent care like facility that did not offer cardiac cath) - was recathed - actually twice. Had normal left heart pressures, patent stens extending from the proximal dow to the distal LAD and severe diffuse disease involving the branches of all 3 major epicardial vessels. No clear culprit lesion to intervene on - then had Myoview which showed normal perfusion and normal EF at 59%. Was recathed and had FFR of the first OM, posterior descending and distal RCA which were nonsignificant - no PCI was performed. CXR was normal. Echo was also updated - normal EF - mildly dilated aorta noted with mild LVH. I did not get  a discharge summary - they brought a RX in for 1000 mg of Ranexa - this was not filled.  I saw him back after his return from [place].  He has reiterated his story again about what happened to him in [place]. He did probably over exert him self - had walked 6 1/2 miles in the process of putting up the stands/cutting trees, etc. Had pretty classic chest pain associated with dyspnea and feeling of "impending doom". He notes that his symptoms seemed to abate after the second cath. He did take the higher dose of Ranexa but had some side effects (low BP, couldn't think, stomach upset, dizziness) and cut the dose back - now back to his prior dose.   Since his trip - he had not had any issues since Thanksgiving - had a lot of people in his house. Went hunting again. Lots of stress and 2 or 3 times had some fleeting pain. But was now feeling pretty good. He has brought his cath CDs and these were reviewed by Dr. Burt Knack. He agreed with continued medical therapy. Films were loaded in the [facility] system.  Comes back today. Here with Hunter Mcbride, his wife. Doing ok. Still with some "twinges" of his chest. Shoveled snow a few days ago but had chest pressure/burning that night and took 2 NTG with relief. 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 Seems to have more symptoms with "pushing". Has been on what sounds like a new lipid lowering agent - but will be cost prohibitive. Seeing Dr. Joylene Draft later this am to discuss.    Past Medical History  Diagnosis Date  . Hyperlipidemia   . Hypertension   . Gout     "maybe twice in my life"  . GERD (gastroesophageal reflux disease)   . Myocardial infarction     "/Dr. Katharina Caper I've had one between 2014-2015) (06/21/2013)  . Asthma     "not sure if this is true or not" (06/21/2013)  . OSA on CPAP   . Type II diabetes mellitus   . Headache(784.0)     "weekly for the last 3-4 months" (06/21/2013)  . Arthritis     "lower back; right shoulder; left thumb; joints" (06/21/2013)  .  Osteoarthritis   . CAD (coronary artery disease) May 2015    s/p PTCA/DES x 3 to mid LAD, mod non-ob disease in Cx and RCA May 2015; s/p repeat caths - last study in November of 2015 - stents patent, - FFR - managed medically.     Past Surgical History  Procedure Laterality Date  . Cardiovascular stress test  06/10/2008    EF 68%  . Carpal tunnel release Bilateral   . Excision morton's neuroma Left   . Tonsillectomy    . Eye surgery Left     "removed film over my eye"  . Total shoulder arthroplasty  03/01/2012    Procedure: TOTAL SHOULDER ARTHROPLASTY;  Surgeon: Marin Shutter, MD;  Location: [facility];  Service: Orthopedics;  Laterality: Right;  . Cardiac catheterization  1980's    "once"  . Coronary angioplasty with stent placement  06/21/2013    "3"  . Hernia repair Left   . Umbilical hernia repair    . Joint replacement Left 03/2013    "thumb"  . Left heart catheterization with coronary angiogram N/A 06/21/2013    Procedure: LEFT HEART CATHETERIZATION WITH CORONARY ANGIOGRAM;  Surgeon: Burnell Blanks, MD;  Location: [facility] CATH LAB;  Service: Cardiovascular;  Laterality: N/A;  . Left heart catheterization with coronary angiogram N/A 06/26/2013    Procedure: LEFT HEART CATHETERIZATION WITH CORONARY ANGIOGRAM;  Surgeon: [name], MD;  Location: [facility] CATH LAB;  Service: Cardiovascular;  Laterality: N/A;  . Fractional flow reserve wire N/A 06/26/2013    Procedure: [place];  Surgeon: [name], MD;  Location: [facility] CATH LAB;  Service: Cardiovascular;  Laterality: N/A;     Medications: Current Outpatient Prescriptions  Medication Sig Dispense Refill  . acetaminophen (TYLENOL ARTHRITIS PAIN) 650 MG CR tablet Take 1,300 mg by mouth 2 (two) times daily as needed. For pain    . amLODipine (NORVASC) 5 MG tablet Take 1 tablet (5 mg total) by mouth daily. 90 tablet 3  . aspirin 81 MG tablet Take 81 mg by mouth at bedtime.     [name] atenolol (TENORMIN) 50 MG tablet Take  1 tablet (50 mg total) by mouth daily. 90 tablet 3  . atorvastatin (LIPITOR) 80 MG tablet Take 80 mg by mouth daily.    . chlorpheniramine-HYDROcodone (TUSSIONEX) 10-8 MG/5ML [facility] Take 5 mLs by mouth every 12 (twelve) hours as needed for cough.    . cimetidine (TAGAMET) 200 MG tablet Take 200 mg by mouth daily as needed (for heartburn).    . clopidogrel (PLAVIX) 75 MG tablet Take 1 tablet (75 mg total) by  mouth daily with breakfast. 90 tablet 3  . gabapentin (NEURONTIN) 300 MG capsule Take 300 mg by mouth at bedtime.      . hydrochlorothiazide (HYDRODIURIL) 25 MG tablet Take 25 mg by mouth daily.     . irbesartan (AVAPRO) 300 MG tablet Take 150 mg by mouth daily.     [name] LORazepam (ATIVAN) 0.5 MG tablet Take 0.5 mg by mouth as needed for anxiety.     . metFORMIN (GLUCOPHAGE-XR) 500 MG 24 hr tablet Take 500 mg by mouth 2 (two) times daily. 1 tablet in the am 2 tablets in the pm    . Multiple Vitamin (MULTIVITAMIN WITH MINERALS) TABS tablet Take 1 tablet by mouth daily.    . nitroGLYCERIN (NITROSTAT) 0.4 MG SL tablet Place 1 tablet (0.4 mg total) under the tongue every 5 (five) minutes as needed. For chest pain 25 tablet 5  . ONGLYZA 5 MG TABS tablet Take 5 mg by mouth daily.     . pantoprazole (PROTONIX) 40 MG tablet Take 40 mg by mouth daily.    . ranolazine (RANEXA) 500 MG 12 hr tablet Take 1 tablet (500 mg total) by mouth 2 (two) times daily. 180 tablet 3  . traMADol (ULTRAM) 50 MG tablet Take 50 mg by mouth every 6 (six) hours as needed (for pain).      No current facility-administered medications for this visit.    Allergies: No Known Allergies  Social History: The patient  reports that he has never smoked. He has never used smokeless tobacco. He reports that he drinks alcohol. He reports that he does not use illicit drugs.   Family History: The patient's family history includes Coronary artery disease in his cousin; Diabetes type II in his mother; Heart attack in his father; Heart  disease in his brother.   Review of Systems: Please see the history of present illness.   Otherwise, the review of systems is positive for chest pain, back pain, dizziness, easy bruising, headaches and chest pressure.   All other systems are reviewed and negative.   Physical Exam: VS:  BP 130/78 mmHg  Pulse 77  Ht 5\' 9"  (1.753 m)  Wt 206 lb 12.8 oz (93.804 kg)  BMI 30.53 kg/m2 .  BMI Body mass index is 30.53 kg/(m^2). General: Pleasant. Well developed, well nourished and in no acute distress.  HEENT: Normal. Neck: Supple, no JVD, carotid bruits, or masses noted.  Cardiac: Regular rate and rhythm. No murmurs, rubs, or gallops. No edema.  Respiratory:  Lungs are clear to auscultation bilaterally with normal work of breathing.  GI: Soft and nontender.  MS: No deformity or atrophy. Gait and ROM intact. Skin: Warm and dry. Color is normal.  Neuro:  Strength and sensation are intact and no gross focal deficits noted.  Psych: Alert, appropriate and with normal affect.   Wt Readings from Last 3 Encounters:  03/12/14 206 lb 12.8 oz (93.804 kg)  01/14/14 201 lb 1.9 oz (91.227 kg)  10/07/13 205 lb (92.987 kg)    LABORATORY DATA:  EKG:  EKG is not ordered today.   Lab Results  Component Value Date   WBC 4.5 06/27/2013   HGB 13.0 06/27/2013   HCT 36.9* 06/27/2013   PLT 156 06/27/2013   GLUCOSE 144* 06/27/2013   CHOL 123 04/11/2011   TRIG 119.0 04/11/2011   HDL 41.60 04/11/2011   LDLCALC 58 04/11/2011   ALT 28 06/27/2013   AST 17 06/27/2013   NA 140 06/27/2013   K  4.3 06/27/2013   CL 102 06/27/2013   CREATININE 0.95 06/27/2013   BUN 14 06/27/2013   CO2 26 06/27/2013   INR 1.05 06/25/2013   HGBA1C 8.4* 06/26/2013    BNP (last 3 results)  Recent Labs  06/25/13 1902  PROBNP 30.0    Other Studies Reviewed Today: N/A  Assessment/Plan: 1. CAD with recent history of unstable angina - s/p repeat caths with FFR - no lesions intervened on. Dr. Burt Knack has reviewed as well.  Continues with some angina - would like to rechallenge him with Imdur - he has not tolerated in the past due to headache but admits he did not give it enough time. Will restart 30 mg but just take 1/2 tablet - will try at night. Tylenol as needed.   2. HTN - BP looks good on current regimen.   3. HLD - on statin - sounds like he has been tried on PSK-9 - not going to be able to afford - could consider referral to Lipid clinic to see if he would qualify for study. He will discuss with Dr. Joylene Draft.   4. DM - followed by Dr. Joylene Draft.   5. Mildly dilated aortic root/ascending aorta - not seen on our last echo from May 2015 - would follow.  Current medicines are reviewed with the patient today.  The patient does not have concerns regarding medicines.  The following changes have been made: see above  Labs/ tests ordered today include:   No orders of the defined types were placed in this encounter.    Disposition:   FU with me in 3 months  Patient is agreeable to this plan and will call if any problems develop in the interim.   Signed: Burtis Junes, RN, ANP-C 03/12/2014 8:54 AM  [facility] [address] [place],   [address] Phone: [phone] Fax: [phone]

## 2014-03-13 DIAGNOSIS — Z1212 Encounter for screening for malignant neoplasm of rectum: Secondary | ICD-10-CM | POA: Diagnosis not present

## 2014-03-17 DIAGNOSIS — Z471 Aftercare following joint replacement surgery: Secondary | ICD-10-CM | POA: Diagnosis not present

## 2014-03-17 DIAGNOSIS — Z96611 Presence of right artificial shoulder joint: Secondary | ICD-10-CM | POA: Diagnosis not present

## 2014-06-09 ENCOUNTER — Ambulatory Visit: Payer: Medicare Other | Admitting: Nurse Practitioner

## 2014-06-09 NOTE — Progress Notes (Signed)
CARDIOLOGY OFFICE NOTE  Date:  06/10/2014    [name] Date of Birth: 1945/11/04 Medical Record #[id number]  PCP:  Jerlyn Ly, MD  Cardiologist:  Nahser    Chief Complaint  Patient presents with  . Coronary Artery Disease    3 month check - seen for Dr. Acie Fredrickson     History of Present Illness: Hunter Mcbride is a 69 y.o. male who presents today for a follow up visit. Seen for Dr. Acie Fredrickson. His wife is a former patient of Dr. Susa Simmonds that I have taken care of. Mr. Lewers has CAD, HTN, HLD, DM and OSA.   He presented back in the spring with a 2 month history of exertional chest pain - had abnormal Myoview - then cathed and had DES x 3 to the LAD. Otherwise had moderate but nonobstructive disease With a normal EF of 55 to 60%. He was discharged 06/22/13. On Plavix. After his initial event - he went back to the ER and has been recathed - to continue with medical management. He has not tolerated [facility] active NTG.   In November of 2015 - he went hunting with his son. He had been cutting trees and setting up stands. Developed SSCP that initially resolved but then returned, subsequently associated with SOB and nausea - ended up going to the hospital in [place] (after first presenting to an urgent care like facility that did not offer cardiac cath) - was recathed - actually twice. Had normal left heart pressures, patent stents extending from the proximal to the distal LAD and severe diffuse disease involving the branches of all 3 major epicardial vessels. No clear culprit lesion to intervene on - then had Myoview which showed normal perfusion and normal EF at 59%. Was recathed and had FFR of the first OM, posterior descending and distal RCA which were nonsignificant - no PCI was performed. CXR was normal. Echo was also updated - normal EF - mildly dilated aorta noted with mild LVH. I did not get a discharge summary - they brought a RX in for 1000 mg of Ranexa - this was not filled.  I saw  him back after his return from [place]. He reiterated his story again about what happened to him in [place]. He did probably over exert him self - had walked 6 1/2 miles in the process of putting up the stands/cutting trees, etc. Had pretty classic chest pain associated with dyspnea and feeling of "impending doom". He notes that his symptoms seemed to abate after the second cath. He did take the higher dose of Ranexa but had some side effects (low BP, couldn't think, stomach upset, dizziness) and cut the dose back - now back to his prior dose.   Since his trip - he has had some fleeting pain - he did bring his cath CDs and these were reviewed by Dr. Burt Knack. He agreed with continued medical therapy. Films were loaded in the [facility] system. Last seen in January of 2016. Due to some continued spells of angina - I rechallenged him with Imdur.   Comes back today. Here with Hunter Mcbride, his wife. Last week with 2 day history of chest pain - thought it was indigestion - sharp pain - felt like something was "leaking" down his chest - like a "hot burning" pain - would come and go - only lasted 30 to 45 seconds at a time. He did use NTG. BP is up. He has been placed on Invokana -  had his HCTZ cut back. BP is up. Remains very active. Lots of walking on a daily basis - several miles per day. Losing weight.   Past Medical History  Diagnosis Date  . Hyperlipidemia   . Hypertension   . Gout     "maybe twice in my life"  . GERD (gastroesophageal reflux disease)   . Myocardial infarction     "/Dr. Katharina Caper I've had one between 2014-2015) (06/21/2013)  . Asthma     "not sure if this is true or not" (06/21/2013)  . OSA on CPAP   . Type II diabetes mellitus   . Headache(784.0)     "weekly for the last 3-4 months" (06/21/2013)  . Arthritis     "lower back; right shoulder; left thumb; joints" (06/21/2013)  . Osteoarthritis   . CAD (coronary artery disease) May 2015    s/p PTCA/DES x 3 to mid LAD, mod non-ob disease in Cx and RCA  May 2015; s/p repeat caths - last study in November of 2015 - stents patent, - FFR - managed medically.     Past Surgical History  Procedure Laterality Date  . Cardiovascular stress test  06/10/2008    EF 68%  . Carpal tunnel release Bilateral   . Excision morton's neuroma Left   . Tonsillectomy    . Eye surgery Left     "removed film over my eye"  . Total shoulder arthroplasty  03/01/2012    Procedure: TOTAL SHOULDER ARTHROPLASTY;  Surgeon: Marin Shutter, MD;  Location: [facility];  Service: Orthopedics;  Laterality: Right;  . Cardiac catheterization  1980's    "once"  . Coronary angioplasty with stent placement  06/21/2013    "3"  . Hernia repair Left   . Umbilical hernia repair    . Joint replacement Left 03/2013    "thumb"  . Left heart catheterization with coronary angiogram N/A 06/21/2013    Procedure: LEFT HEART CATHETERIZATION WITH CORONARY ANGIOGRAM;  Surgeon: Burnell Blanks, MD;  Location: [facility] CATH LAB;  Service: Cardiovascular;  Laterality: N/A;  . Left heart catheterization with coronary angiogram N/A 06/26/2013    Procedure: LEFT HEART CATHETERIZATION WITH CORONARY ANGIOGRAM;  Surgeon: [name], MD;  Location: [facility] CATH LAB;  Service: Cardiovascular;  Laterality: N/A;  . Fractional flow reserve wire N/A 06/26/2013    Procedure: Waterproof;  Surgeon: [name], MD;  Location: [name] CATH LAB;  Service: Cardiovascular;  Laterality: N/A;     Medications: Current Outpatient Prescriptions  Medication Sig Dispense Refill  . acetaminophen (TYLENOL ARTHRITIS PAIN) 650 MG CR tablet Take 1,300 mg by mouth 2 (two) times daily as needed. For pain    . amLODipine (NORVASC) 5 MG tablet Take 1 tablet (5 mg total) by mouth daily. 90 tablet 3  . aspirin 81 MG tablet Take 81 mg by mouth at bedtime.     [name] atenolol (TENORMIN) 50 MG tablet Take 1 tablet (50 mg total) by mouth daily. 90 tablet 3  . atorvastatin (LIPITOR) 80 MG tablet Take 80 mg by mouth daily.    .  chlorpheniramine-HYDROcodone (TUSSIONEX) 10-8 MG/5ML LQCR Take 5 mLs by mouth every 12 (twelve) hours as needed for cough.    . clopidogrel (PLAVIX) 75 MG tablet Take 1 tablet (75 mg total) by mouth daily with breakfast. 90 tablet 3  . gabapentin (NEURONTIN) 300 MG capsule Take 300 mg by mouth at bedtime.      . hydrochlorothiazide (HYDRODIURIL) 25 MG tablet Take  12.5 mg by mouth daily.     . INVOKANA 100 MG TABS tablet Take 100 mg by mouth daily.   2  . irbesartan (AVAPRO) 300 MG tablet Take 1 tablet (300 mg total) by mouth daily. 90 tablet 3  . isosorbide mononitrate (IMDUR) 30 MG 24 hr tablet Take 0.5 tablets (15 mg total) by mouth daily. 90 tablet 3  . LORazepam (ATIVAN) 0.5 MG tablet Take 0.5 mg by mouth as needed for anxiety.     . metFORMIN (GLUCOPHAGE-XR) 500 MG 24 hr tablet Take 1,000 mg by mouth 2 (two) times daily. 1 tablet in the am 2 tablets in the pm    . Multiple Vitamin (MULTIVITAMIN WITH MINERALS) TABS tablet Take 1 tablet by mouth daily.    . nitroGLYCERIN (NITROSTAT) 0.4 MG SL tablet Place 1 tablet (0.4 mg total) under the tongue every 5 (five) minutes as needed. For chest pain 25 tablet 5  . pantoprazole (PROTONIX) 40 MG tablet Take 40 mg by mouth daily.    . ranolazine (RANEXA) 500 MG 12 hr tablet Take 1 tablet (500 mg total) by mouth 2 (two) times daily. 180 tablet 3  . traMADol (ULTRAM) 50 MG tablet Take 50 mg by mouth every 6 (six) hours as needed (for pain).      No current facility-administered medications for this visit.    Allergies: No Known Allergies  Social History: The patient  reports that he has never smoked. He has never used smokeless tobacco. He reports that he drinks alcohol. He reports that he does not use illicit drugs.   Family History: The patient's family history includes Coronary artery disease in his cousin; Diabetes type II in his mother; Heart attack in his father; Heart disease in his brother.   Review of Systems: Please see the history of  present illness.   Otherwise, the review of systems is positive for stable angina.   All other systems are reviewed and negative.   Physical Exam: VS:  BP 160/80 mmHg  Pulse 56  Ht '5\' 9"'$  (1.753 m)  Wt 198 lb (89.812 kg)  BMI 29.23 kg/m2 .  BMI Body mass index is 29.23 kg/(m^2).  Wt Readings from Last 3 Encounters:  06/10/14 198 lb (89.812 kg)  03/12/14 206 lb 12.8 oz (93.804 kg)  01/14/14 201 lb 1.9 oz (91.227 kg)    General: Pleasant. Well developed, well nourished and in no acute distress. He has lost 8 pounds.  HEENT: Normal. Neck: Supple, no JVD, carotid bruits, or masses noted.  Cardiac: Regular rate and rhythm. No murmurs, rubs, or gallops. No edema.  Respiratory:  Lungs are clear to auscultation bilaterally with normal work of breathing.  GI: Soft and nontender.  MS: No deformity or atrophy. Gait and ROM intact. Skin: Warm and dry. Color is normal.  Neuro:  Strength and sensation are intact and no gross focal deficits noted.  Psych: Alert, appropriate and with normal affect.   LABORATORY DATA:  EKG:  EKG is ordered today. This shows sinus brady with inferior Q's.   Lab Results  Component Value Date   WBC 4.5 06/27/2013   HGB 13.0 06/27/2013   HCT 36.9* 06/27/2013   PLT 156 06/27/2013   GLUCOSE 144* 06/27/2013   CHOL 123 04/11/2011   TRIG 119.0 04/11/2011   HDL 41.60 04/11/2011   LDLCALC 58 04/11/2011   ALT 28 06/27/2013   AST 17 06/27/2013   NA 140 06/27/2013   K 4.3 06/27/2013   CL 102 06/27/2013  CREATININE 0.95 06/27/2013   BUN 14 06/27/2013   CO2 26 06/27/2013   INR 1.05 06/25/2013   HGBA1C 8.4* 06/26/2013    BNP (last 3 results) No results for input(s): BNP in the last 8760 hours.  ProBNP (last 3 results)  Recent Labs  06/25/13 1902  PROBNP 30.0     Other Studies Reviewed Today:   Assessment/Plan: 1. CAD with history of unstable angina - s/p repeat caths with FFR back in the fall of 2015 - no lesions intervened on. Dr. Burt Knack has  previously reviewed as well. I suspect he will have some episodes of angina. He has NTG on hand to use as needed. He has been able to get back on Taliercio acting nitrate therapy. I have left him on his current regimen. See back in 4 months.   2. HTN - BP is up - I am increasing his Avapro back to 300 mg a day. He is to monitor at home.   3. HLD - on statin   4. DM - followed by Dr. Joylene Draft.   5. Mildly dilated aortic root/ascending aorta - not seen on our last echo from May 2015 - would follow.  Current medicines are reviewed with the patient today.  The patient does not have concerns regarding medicines other than what has been noted above.  The following changes have been made:  See above.  Labs/ tests ordered today include:    Orders Placed This Encounter  Procedures  . EKG 12-Lead     Disposition:   FU with me in 4 months.   Patient is agreeable to this plan and will call if any problems develop in the interim.   Signed: Burtis Junes, RN, ANP-C 06/10/2014 9:28 AM  [facility] [address] [place], [place]  [address] Phone: [phone] Fax: [phone]

## 2014-06-10 ENCOUNTER — Encounter: Payer: Self-pay | Admitting: Nurse Practitioner

## 2014-06-10 ENCOUNTER — Ambulatory Visit (INDEPENDENT_AMBULATORY_CARE_PROVIDER_SITE_OTHER): Payer: Medicare Other | Admitting: Nurse Practitioner

## 2014-06-10 VITALS — BP 160/80 | HR 56 | Ht 69.0 in | Wt 198.0 lb

## 2014-06-10 DIAGNOSIS — I10 Essential (primary) hypertension: Secondary | ICD-10-CM | POA: Diagnosis not present

## 2014-06-10 DIAGNOSIS — E785 Hyperlipidemia, unspecified: Secondary | ICD-10-CM | POA: Diagnosis not present

## 2014-06-10 DIAGNOSIS — I259 Chronic ischemic heart disease, unspecified: Secondary | ICD-10-CM

## 2014-06-10 MED ORDER — IRBESARTAN 300 MG PO TABS: 300.0000 mg | ORAL_TABLET | Freq: Every day | ORAL | Status: DC

## 2014-06-10 NOTE — Patient Instructions (Addendum)
We will be checking the following labs today - NONE   Medication Instructions:    Continue with your current medicines.   I am increasing the Avapro to a whole tablet - I will send that to your mail order  We will check on samples of Ranexa   Testing/Procedures To Be Arranged:  N/A  Follow-Up:   We will see you back in 4 months    Other Special Instructions:   Check BP and keep a diary  Call the [place] office at [phone] if you have any questions, problems or concerns.

## 2014-06-12 DIAGNOSIS — I2510 Atherosclerotic heart disease of native coronary artery without angina pectoris: Secondary | ICD-10-CM | POA: Diagnosis not present

## 2014-06-12 DIAGNOSIS — E119 Type 2 diabetes mellitus without complications: Secondary | ICD-10-CM | POA: Diagnosis not present

## 2014-06-12 DIAGNOSIS — Z6828 Body mass index (BMI) 28.0-28.9, adult: Secondary | ICD-10-CM | POA: Diagnosis not present

## 2014-06-12 DIAGNOSIS — E785 Hyperlipidemia, unspecified: Secondary | ICD-10-CM | POA: Diagnosis not present

## 2014-06-12 DIAGNOSIS — I209 Angina pectoris, unspecified: Secondary | ICD-10-CM | POA: Diagnosis not present

## 2014-06-12 DIAGNOSIS — I10 Essential (primary) hypertension: Secondary | ICD-10-CM | POA: Diagnosis not present

## 2014-06-23 ENCOUNTER — Telehealth: Payer: Self-pay | Admitting: *Deleted

## 2014-06-23 NOTE — Telephone Encounter (Signed)
Pt sent in BP readings:  06/11/14  - AM 114/71 HR 54 PM  126/77 HR 61 06/12/14 - AM  126/73 HR 56   PM  121/78 HR 64 06/13/14 - AM  127/76  HR 50 PM  144/76 HR 52 06/14/14 - AM  135/79  HR 53 PM  134/77 HR 59 06/15/14 -  AM  128/75  HR 58  06/16/14 -   AM  124/78  HR 54 PM  126/68  HR 55 06/17/14 -   AM  120/73  HR 49  PM  117/73  HR  56

## 2014-07-10 DIAGNOSIS — M25531 Pain in right wrist: Secondary | ICD-10-CM | POA: Diagnosis not present

## 2014-07-10 DIAGNOSIS — M79604 Pain in right leg: Secondary | ICD-10-CM | POA: Diagnosis not present

## 2014-07-28 ENCOUNTER — Encounter: Payer: Self-pay | Admitting: Nurse Practitioner

## 2014-07-28 NOTE — Progress Notes (Signed)
Received notification to stop Plavix for colonoscopy on 08/21/14 from Dr. Earlean Shawl.  Will stop Plavix 5 days prior. He is to remain on aspirin Resume Plavix the evening after his procedure.  Burtis Junes, RN, [facility] [address] [place], [place]  [address] [phone]

## 2014-07-29 ENCOUNTER — Telehealth: Payer: Self-pay

## 2014-07-29 NOTE — Telephone Encounter (Signed)
Patient's wife came to office to get samples of ranexa gave them to her at front desk

## 2014-07-31 DIAGNOSIS — M25531 Pain in right wrist: Secondary | ICD-10-CM | POA: Diagnosis not present

## 2014-08-04 ENCOUNTER — Other Ambulatory Visit: Payer: Self-pay | Admitting: Cardiovascular Disease

## 2014-08-11 ENCOUNTER — Other Ambulatory Visit: Payer: Self-pay

## 2014-08-20 ENCOUNTER — Telehealth: Payer: Self-pay | Admitting: Nurse Practitioner

## 2014-08-20 DIAGNOSIS — Z5181 Encounter for therapeutic drug level monitoring: Secondary | ICD-10-CM | POA: Diagnosis not present

## 2014-08-20 DIAGNOSIS — Z7901 Long term (current) use of anticoagulants: Secondary | ICD-10-CM | POA: Diagnosis not present

## 2014-08-20 NOTE — Telephone Encounter (Signed)
New message      Want samples of ranexa.  Please call

## 2014-08-21 DIAGNOSIS — K641 Second degree hemorrhoids: Secondary | ICD-10-CM | POA: Diagnosis not present

## 2014-08-21 DIAGNOSIS — Z86010 Personal history of colonic polyps: Secondary | ICD-10-CM | POA: Diagnosis not present

## 2014-08-21 DIAGNOSIS — K5730 Diverticulosis of large intestine without perforation or abscess without bleeding: Secondary | ICD-10-CM | POA: Diagnosis not present

## 2014-08-21 NOTE — Telephone Encounter (Signed)
Ranexa samples placed at the front desk for patient.

## 2014-09-08 ENCOUNTER — Telehealth: Payer: Self-pay | Admitting: Nurse Practitioner

## 2014-09-08 NOTE — Telephone Encounter (Signed)
New Message  Pt wife calling about Renaxa samples (pt takes 500 mg); Please call back and discuss.

## 2014-09-09 ENCOUNTER — Other Ambulatory Visit: Payer: Self-pay

## 2014-09-09 NOTE — Telephone Encounter (Signed)
Pt called in for samples of Ranexa 500, he was given 1 month supply

## 2014-09-25 DIAGNOSIS — Z6828 Body mass index (BMI) 28.0-28.9, adult: Secondary | ICD-10-CM | POA: Diagnosis not present

## 2014-09-25 DIAGNOSIS — L0889 Other specified local infections of the skin and subcutaneous tissue: Secondary | ICD-10-CM | POA: Diagnosis not present

## 2014-10-06 DIAGNOSIS — Z6827 Body mass index (BMI) 27.0-27.9, adult: Secondary | ICD-10-CM | POA: Diagnosis not present

## 2014-10-06 DIAGNOSIS — E119 Type 2 diabetes mellitus without complications: Secondary | ICD-10-CM | POA: Diagnosis not present

## 2014-10-06 DIAGNOSIS — L0889 Other specified local infections of the skin and subcutaneous tissue: Secondary | ICD-10-CM | POA: Diagnosis not present

## 2014-10-07 ENCOUNTER — Telehealth: Payer: Self-pay | Admitting: *Deleted

## 2014-10-07 NOTE — Telephone Encounter (Signed)
S/w Lorenda Hatchet, scheduler, stated pt called in yesterday looking for Ranexa Samples and stated no one called pt back and there is no telephone note.  I s/w pt and left Ranexa ( 500 mg ) up front. Pt aware.

## 2014-10-10 ENCOUNTER — Encounter: Payer: Self-pay | Admitting: Nurse Practitioner

## 2014-10-10 ENCOUNTER — Ambulatory Visit (INDEPENDENT_AMBULATORY_CARE_PROVIDER_SITE_OTHER): Payer: Medicare Other | Admitting: Nurse Practitioner

## 2014-10-10 VITALS — BP 130/70 | HR 63 | Ht 69.0 in | Wt 190.4 lb

## 2014-10-10 DIAGNOSIS — I10 Essential (primary) hypertension: Secondary | ICD-10-CM

## 2014-10-10 DIAGNOSIS — I259 Chronic ischemic heart disease, unspecified: Secondary | ICD-10-CM | POA: Diagnosis not present

## 2014-10-10 DIAGNOSIS — E785 Hyperlipidemia, unspecified: Secondary | ICD-10-CM | POA: Diagnosis not present

## 2014-10-10 MED ORDER — CLOPIDOGREL BISULFATE 75 MG PO TABS: 75.0000 mg | ORAL_TABLET | Freq: Every day | ORAL | Status: DC

## 2014-10-10 MED ORDER — IRBESARTAN 300 MG PO TABS: 300.0000 mg | ORAL_TABLET | Freq: Every day | ORAL | Status: DC

## 2014-10-10 NOTE — Progress Notes (Signed)
CARDIOLOGY OFFICE NOTE  Date:  10/10/2014    [name] Date of Birth: 09/05/1945 Medical Record #[id number]  PCP:  Jerlyn Ly, MD  Cardiologist:  Nahser    Chief Complaint  Patient presents with  . Coronary Artery Disease    4 month check - seen for Dr. Acie Fredrickson    History of Present Illness: Hunter Mcbride is a 69 y.o. male who presents today for a 4 month check. Seen for Dr. Acie Fredrickson. His wife is a former patient of Dr. Susa Simmonds that I have taken care of. Mr. Harvel has CAD, HTN, HLD, DM and OSA.   He presented back in the spring with a 2 month history of exertional chest pain - had abnormal Myoview - then cathed and had DES x 3 to the LAD. Otherwise had moderate but nonobstructive disease With a normal EF of 55 to 60%. He was discharged 06/22/13. On Plavix. After his initial event - he went back to the ER and has been recathed - to continue with medical management. He has not tolerated [place] active NTG.   In November of 2015 - he went hunting with his son. He had been cutting trees and setting up stands. Developed SSCP that initially resolved but then returned, subsequently associated with SOB and nausea - ended up going to the hospital in [place] (after first presenting to an urgent care like facility that did not offer cardiac cath) - was recathed - actually twice. Had normal left heart pressures, patent stents extending from the proximal to the distal LAD and severe diffuse disease involving the branches of all 3 major epicardial vessels. No clear culprit lesion to intervene on - then had Myoview which showed normal perfusion and normal EF at 59%. Was recathed and had FFR of the first OM, posterior descending and distal RCA which were nonsignificant - no PCI was performed. CXR was normal. Echo was also updated - normal EF - mildly dilated aorta noted with mild LVH. I did not get a discharge summary - they brought a RX in for 1000 mg of Ranexa - this was not filled.  I saw him  back after his return from [place]. He reiterated his story again about what happened to him in [place]. He did probably over exert him self - had walked 6 1/2 miles in the process of putting up the stands/cutting trees, etc. Had pretty classic chest pain associated with dyspnea and feeling of "impending doom". He notes that his symptoms seemed to abate after the second cath. He did take the higher dose of Ranexa but had some side effects (low BP, couldn't think, stomach upset, dizziness) and cut the dose back - now back to his prior dose.   Since that trip - he had had some fleeting pain - he did bring his cath CDs and these were reviewed by Dr. Burt Knack. He agreed with continued medical therapy. Films were loaded in the [facility] system. Last seen in January of 2016. Due to some continued spells of angina - I rechallenged him with Imdur. Seen back in April and was doing ok but still with some rare spells of chest pain.   Comes back today. Here with [name], his wife. He has used NTG maybe on 4 occasions since last visit - seemed to be moreso at rest but after exertion. NTG does relieve his discomfort. Staying active. Not as hungry - has lost weight. He has always had this tendency to "over do" -  very physical in his day to day activities. Really does not pace himself despite multiple times recommended. Overall, he is pleased with how he is doing.  Past Medical History  Diagnosis Date  . Hyperlipidemia   . Hypertension   . Gout     "maybe twice in my life"  . GERD (gastroesophageal reflux disease)   . Myocardial infarction     "/Dr. Katharina Caper I've had one between 2014-2015) (06/21/2013)  . Asthma     "not sure if this is true or not" (06/21/2013)  . OSA on CPAP   . Type II diabetes mellitus   . Headache(784.0)     "weekly for the last 3-4 months" (06/21/2013)  . Arthritis     "lower back; right shoulder; left thumb; joints" (06/21/2013)  . Osteoarthritis   . CAD (coronary artery disease) May 2015    s/p  PTCA/DES x 3 to mid LAD, mod non-ob disease in Cx and RCA May 2015; s/p repeat caths - last study in November of 2015 - stents patent, - FFR - managed medically.     Past Surgical History  Procedure Laterality Date  . Cardiovascular stress test  06/10/2008    EF 68%  . Carpal tunnel release Bilateral   . Excision morton's neuroma Left   . Tonsillectomy    . Eye surgery Left     "removed film over my eye"  . Total shoulder arthroplasty  03/01/2012    Procedure: TOTAL SHOULDER ARTHROPLASTY;  Surgeon: Marin Shutter, MD;  Location: [facility];  Service: Orthopedics;  Laterality: Right;  . Cardiac catheterization  1980's    "once"  . Coronary angioplasty with stent placement  06/21/2013    "3"  . Hernia repair Left   . Umbilical hernia repair    . Joint replacement Left 03/2013    "thumb"  . Left heart catheterization with coronary angiogram N/A 06/21/2013    Procedure: LEFT HEART CATHETERIZATION WITH CORONARY ANGIOGRAM;  Surgeon: Burnell Blanks, MD;  Location: [facility] CATH LAB;  Service: Cardiovascular;  Laterality: N/A;  . Left heart catheterization with coronary angiogram N/A 06/26/2013    Procedure: LEFT HEART CATHETERIZATION WITH CORONARY ANGIOGRAM;  Surgeon: [name], MD;  Location: [facility] CATH LAB;  Service: Cardiovascular;  Laterality: N/A;  . Fractional flow reserve wire N/A 06/26/2013    Procedure: [place];  Surgeon: [name], MD;  Location: [facility] CATH LAB;  Service: Cardiovascular;  Laterality: N/A;     Medications: Current Outpatient Prescriptions  Medication Sig Dispense Refill  . acetaminophen (TYLENOL ARTHRITIS PAIN) 650 MG CR tablet Take 1,300 mg by mouth 2 (two) times daily as needed. For pain    . amLODipine (NORVASC) 5 MG tablet Take 1 tablet (5 mg total) by mouth daily. 90 tablet 3  . aspirin 81 MG tablet Take 81 mg by mouth at bedtime.     [name] atenolol (TENORMIN) 50 MG tablet Take 1 tablet (50 mg total) by mouth daily. 90 tablet 3  .  atorvastatin (LIPITOR) 80 MG tablet Take 80 mg by mouth daily.    . chlorpheniramine-HYDROcodone (TUSSIONEX) 10-8 MG/5ML LQCR Take 5 mLs by mouth every 12 (twelve) hours as needed for cough.    . clopidogrel (PLAVIX) 75 MG tablet Take 1 tablet (75 mg total) by mouth daily with breakfast. 90 tablet 3  . doxycycline (VIBRAMYCIN) 100 MG capsule TAKE ONE CAPSULE BY MOUTH TWICE A DAY FOR 4 DAYS  0  . gabapentin (NEURONTIN) 300 MG  capsule Take 300 mg by mouth at bedtime.      . hydrochlorothiazide (HYDRODIURIL) 25 MG tablet Take 12.5 mg by mouth daily.     . INVOKANA 100 MG TABS tablet Take 100 mg by mouth daily.   2  . irbesartan (AVAPRO) 300 MG tablet Take 1 tablet (300 mg total) by mouth daily. 90 tablet 3  . isosorbide mononitrate (IMDUR) 30 MG 24 hr tablet Take 0.5 tablets (15 mg total) by mouth daily. 90 tablet 3  . isosorbide mononitrate (IMDUR) 30 MG 24 hr tablet TAKE 1 TABLET BY MOUTH EVERY DAY 90 tablet 2  . LORazepam (ATIVAN) 0.5 MG tablet Take 0.5 mg by mouth as needed for anxiety.     . metFORMIN (GLUCOPHAGE-XR) 500 MG 24 hr tablet Take 1,000 mg by mouth 2 (two) times daily. 1 tablet in the am 2 tablets in the pm    . Multiple Vitamin (MULTIVITAMIN WITH MINERALS) TABS tablet Take 1 tablet by mouth daily.    . nitroGLYCERIN (NITROSTAT) 0.4 MG SL tablet Place 1 tablet (0.4 mg total) under the tongue every 5 (five) minutes as needed. For chest pain 25 tablet 5  . pantoprazole (PROTONIX) 40 MG tablet Take 40 mg by mouth daily.    . ranolazine (RANEXA) 500 MG 12 hr tablet Take 1 tablet (500 mg total) by mouth 2 (two) times daily. 180 tablet 3  . traMADol (ULTRAM) 50 MG tablet Take 50 mg by mouth every 6 (six) hours as needed (for pain).     . triamcinolone cream (KENALOG) 0.1 % APPLY TO AFFECTED AREA 3 TIMES A DAY AS NEEDED for scratches  0   No current facility-administered medications for this visit.    Allergies: No Known Allergies  Social History: The patient  reports that he has  never smoked. He has never used smokeless tobacco. He reports that he drinks alcohol. He reports that he does not use illicit drugs.   Family History: The patient's family history includes Coronary artery disease in his cousin; Diabetes type II in his mother; Heart attack in his father; Heart disease in his brother.   Review of Systems: Please see the history of present illness.   Otherwise, the review of systems is positive for none.   All other systems are reviewed and negative.   Physical Exam: VS:  BP 130/70 mmHg  Pulse 63  Ht '5\' 9"'$  (1.753 m)  Wt 190 lb 6.4 oz (86.365 kg)  BMI 28.10 kg/m2  SpO2 98% .  BMI Body mass index is 28.1 kg/(m^2).  Wt Readings from Last 3 Encounters:  10/10/14 190 lb 6.4 oz (86.365 kg)  06/10/14 198 lb (89.812 kg)  03/12/14 206 lb 12.8 oz (93.804 kg)    General: Pleasant. Well developed, well nourished and in no acute distress. He has lost 8 pounds. He is suntanned.  HEENT: Normal. Neck: Supple, no JVD, carotid bruits, or masses noted.  Cardiac: Regular rate and rhythm. No murmurs, rubs, or gallops. No edema.  Respiratory:  Lungs are clear to auscultation bilaterally with normal work of breathing.  GI: Soft and nontender.  MS: No deformity or atrophy. Gait and ROM intact. Skin: Warm and dry. Color is normal.  Neuro:  Strength and sensation are intact and no gross focal deficits noted.  Psych: Alert, appropriate and with normal affect.   LABORATORY DATA:  EKG:  EKG is not ordered today.   Lab Results  Component Value Date   WBC 4.5 06/27/2013   HGB  13.0 06/27/2013   HCT 36.9* 06/27/2013   PLT 156 06/27/2013   GLUCOSE 144* 06/27/2013   CHOL 123 04/11/2011   TRIG 119.0 04/11/2011   HDL 41.60 04/11/2011   LDLCALC 58 04/11/2011   ALT 28 06/27/2013   AST 17 06/27/2013   NA 140 06/27/2013   K 4.3 06/27/2013   CL 102 06/27/2013   CREATININE 0.95 06/27/2013   BUN 14 06/27/2013   CO2 26 06/27/2013   INR 1.05 06/25/2013   HGBA1C 8.4*  06/26/2013    BNP (last 3 results) No results for input(s): BNP in the last 8760 hours.  ProBNP (last 3 results) No results for input(s): PROBNP in the last 8760 hours.   Other Studies Reviewed Today:   Assessment/Plan: 1. CAD with history of unstable angina - s/p repeat caths with FFR back in the fall of 2015 - no lesions intervened on. Dr. Burt Knack has previously reviewed as well. I suspect he will have some episodes of angina. He has NTG on hand to use as needed. He has been able to get back on [place] acting nitrate therapy. I have left him on his current regimen. Encouraged moderation of his activities - which is a hard task for him. See back in 6 months.   2. HTN - BP much improved on his current regimen.  3. HLD - on statin   4. DM - followed by Dr. Joylene Draft.   5. Mildly dilated aortic root/ascending aorta - not seen on our last echo from May 2015 - would follow. Will recheck echo on return.   Current medicines are reviewed with the patient today.  The patient does not have concerns regarding medicines other than what has been noted above.  The following changes have been made:  See above.  Labs/ tests ordered today include:   No orders of the defined types were placed in this encounter.     Disposition:   FU with me in 6 months.   Patient is agreeable to this plan and will call if any problems develop in the interim.   Signed: Burtis Junes, RN, ANP-C 10/10/2014 8:17 AM  [facility] [address] [place], [place]  [address] Phone: [phone] Fax: [phone]

## 2014-10-10 NOTE — Patient Instructions (Signed)
We will be checking the following labs today - NONE   Medication Instructions:    Continue with your current medicines.     Testing/Procedures To Be Arranged:  N/A  Follow-Up:   See me in 6 months    Other Special Instructions:   N/A  Call the [place] office at [phone] if you have any questions, problems or concerns.

## 2014-10-14 DIAGNOSIS — L239 Allergic contact dermatitis, unspecified cause: Secondary | ICD-10-CM | POA: Diagnosis not present

## 2014-10-15 DIAGNOSIS — E119 Type 2 diabetes mellitus without complications: Secondary | ICD-10-CM | POA: Diagnosis not present

## 2014-10-15 DIAGNOSIS — Z23 Encounter for immunization: Secondary | ICD-10-CM | POA: Diagnosis not present

## 2014-10-15 DIAGNOSIS — E785 Hyperlipidemia, unspecified: Secondary | ICD-10-CM | POA: Diagnosis not present

## 2014-10-15 DIAGNOSIS — I2510 Atherosclerotic heart disease of native coronary artery without angina pectoris: Secondary | ICD-10-CM | POA: Diagnosis not present

## 2014-10-15 DIAGNOSIS — L0889 Other specified local infections of the skin and subcutaneous tissue: Secondary | ICD-10-CM | POA: Diagnosis not present

## 2014-10-15 DIAGNOSIS — Z6828 Body mass index (BMI) 28.0-28.9, adult: Secondary | ICD-10-CM | POA: Diagnosis not present

## 2014-10-15 DIAGNOSIS — I10 Essential (primary) hypertension: Secondary | ICD-10-CM | POA: Diagnosis not present

## 2014-10-24 DIAGNOSIS — H40003 Preglaucoma, unspecified, bilateral: Secondary | ICD-10-CM | POA: Diagnosis not present

## 2014-10-24 DIAGNOSIS — E119 Type 2 diabetes mellitus without complications: Secondary | ICD-10-CM | POA: Diagnosis not present

## 2014-11-18 ENCOUNTER — Telehealth: Payer: Self-pay | Admitting: Nurse Practitioner

## 2014-11-18 NOTE — Telephone Encounter (Signed)
error 

## 2014-11-19 DIAGNOSIS — B354 Tinea corporis: Secondary | ICD-10-CM | POA: Diagnosis not present

## 2014-11-19 NOTE — Telephone Encounter (Signed)
Patient's wife called in for samples of Ranexa 500.  I provided enough for 2 weeks (2 boxes)

## 2014-11-25 ENCOUNTER — Telehealth: Payer: Self-pay | Admitting: Nurse Practitioner

## 2014-11-25 NOTE — Telephone Encounter (Signed)
Leaving samples of Ranexa ( 500 mg ) bid in front office for pick up

## 2014-11-25 NOTE — Telephone Encounter (Signed)
New Message  Pt wife requested to speak w/ Andee Poles, did not specify the nature of phone call. Please call back and discuss.

## 2014-12-25 DIAGNOSIS — T1512XA Foreign body in conjunctival sac, left eye, initial encounter: Secondary | ICD-10-CM | POA: Diagnosis not present

## 2014-12-30 ENCOUNTER — Telehealth: Payer: Self-pay | Admitting: Nurse Practitioner

## 2014-12-30 NOTE — Telephone Encounter (Signed)
Called pt to inform him that I have left a couple of boxes of Ranexa 500 mg tablets, enough to last for 2 weeks and that he could call back later in the week to see if we have gotten more samples in. I advised the pt that if he has any other problems, questions or concerns to call the office. Pt verbalized understanding.

## 2014-12-30 NOTE — Telephone Encounter (Signed)
New message     Patient calling the office for samples of medication:   1.  What medication and dosage are you requesting samples for? ranexa 500 mg twice a day    2.  Are you currently out of this medication? Yes

## 2015-01-02 ENCOUNTER — Other Ambulatory Visit: Payer: Self-pay

## 2015-01-02 DIAGNOSIS — R0789 Other chest pain: Secondary | ICD-10-CM

## 2015-01-02 MED ORDER — NITROGLYCERIN 0.4 MG SL SUBL: 0.4000 mg | SUBLINGUAL_TABLET | SUBLINGUAL | Status: DC | PRN

## 2015-01-02 NOTE — Telephone Encounter (Signed)
Hunter Junes, NP at 10/10/2014 8:02 AM  nitroGLYCERIN (NITROSTAT) 0.4 MG SL tabletPlace 1 tablet (0.4 mg total) under the tongue every 5 (five) minutes as needed. For chest pain Assessment/Plan: 1. CAD with history of unstable angina - s/p repeat caths with FFR back in the fall of 2015 - no lesions intervened on. Dr. Burt Knack has previously reviewed as well. I suspect he will have some episodes of angina. He has NTG on hand to use as needed. He has been able to get back on [place] acting nitrate therapy. I have left him on his current regimen. Encouraged moderation of his activities - which is a hard task for him. See back in 6 months.  Medication Instructions:    Continue with your current medicines.

## 2015-01-12 ENCOUNTER — Telehealth: Payer: Self-pay | Admitting: Nurse Practitioner

## 2015-01-12 NOTE — Telephone Encounter (Signed)
New message     Patient calling the office for samples of medication:   1.  What medication and dosage are you requesting samples for? ranexa 500 mg   2.  Are you currently out of this medication? Yes

## 2015-01-13 ENCOUNTER — Telehealth: Payer: Self-pay | Admitting: Nurse Practitioner

## 2015-01-13 NOTE — Telephone Encounter (Signed)
Follow Up  Pt's second call   Patient calling the office for samples of medication:  1. What medication and dosage are you requesting samples for? ranexa 500 mg  2. Are you currently out of this medication? Yes

## 2015-01-13 NOTE — Telephone Encounter (Signed)
Error

## 2015-01-13 NOTE — Telephone Encounter (Addendum)
DO NOT HAVE SAMPLES, WILL SEND TO [facility] FOR ASST PROGRAM, INFORMED PT, HE DIDN'T LIKE THAT ANSWER AND WANTS TO TALK TO DANIELLE, I TRIED TO EXPLAIN SEVERAL TIMES THAT WE DO NOT HAVE SAMPLES. HE WOULD NOT TAKE THAT FOR AN ANSWER. WILL HAVE DANIELLE CALL HIM.

## 2015-01-13 NOTE — Telephone Encounter (Signed)
Will forward to Truitt Merle, NP

## 2015-01-13 NOTE — Telephone Encounter (Signed)
DO NOT HAVE SAMPLES, WILL SEND TO [facility] FOR ASST PROGRAM, INFORMED PT, HE DIDNT LIKE THAT AND WANTS TO TALK TO DANIELLE, I TRIED TO EXPLAIN SEVERAL TIMES THAT WE DO NOT HAVE SAMPLES. HE WOULD NOT TAKE THAT FOR AN ANSWER. WILL HAVE DANIELLE CALL HIM.

## 2015-01-13 NOTE — Telephone Encounter (Signed)
I have spoken to Hunter Mcbride, Hunter Mcbride wife. She was upset about the tone of the prior conversation. She was wanting to see if other options were available. I would be hesitant to increase any of his other medicines - he is typically pretty sensitive to medicines.   I have suggested the following:  We will call the patient back on Friday to see if we have any samples We will contact the drug rep to see if we can have samples provided On repeat phone call will suggest the web site to see if any assistance  The patient will contact the pharmacy to see what the exact cost will be.   Burtis Junes, RN, [facility] [address] [place], [place]  [address] [phone]

## 2015-01-13 NOTE — Telephone Encounter (Addendum)
DO NOT HAVE SAMPLES, WILL SEND TO [facility] FOR ASST PROGRAM, INFORMED PT, HE DIDNT LIKE THAT AND WANTS TO TALK TO DANIELLE, I TRIED TO EXPLAIN SEVERAL TIMES THAT WE DO NOT HAVE SAMPLES. HE WOULD NOT TAKE THAT FOR AN ANSWER. WILL HAVE DANIELLE CALL HIM.

## 2015-01-14 ENCOUNTER — Telehealth: Payer: Self-pay | Admitting: *Deleted

## 2015-01-14 ENCOUNTER — Telehealth: Payer: Self-pay | Admitting: Nurse Practitioner

## 2015-01-14 MED ORDER — RANOLAZINE ER 500 MG PO TB12: 500.0000 mg | ORAL_TABLET | Freq: Two times a day (BID) | ORAL | Status: DC

## 2015-01-14 NOTE — Telephone Encounter (Signed)
New message      *STAT* If patient is at the pharmacy, call can be transferred to refill team.   1. Which medications need to be refilled? (please list name of each medication and dose if known) ranexea '500mg'$ ---2 tab bid  2. Which pharmacy/location (including street and city if local pharmacy) is medication to be sent to? [place] 3. Do they need a 30 day or 90 day supply? 30 day

## 2015-01-14 NOTE — Telephone Encounter (Signed)
Error

## 2015-01-14 NOTE — Telephone Encounter (Signed)
Pt's wife stated received 1 month supply of Ranexa ( 500 mg ) bid from pharmacy so pt will not need samples at this time. Pt's wife also stated called to speak with me today. I did not receive a message and pt's wife does not know name of person who took message.

## 2015-01-16 NOTE — Telephone Encounter (Signed)
Called and spoke to patient's wife to let them know we have samples of Ranexa. She states they were able to get his rx filled at pharmacy, with some help. Said she called 2 days ago to let us know that. Thanked me.

## 2015-01-26 DIAGNOSIS — E119 Type 2 diabetes mellitus without complications: Secondary | ICD-10-CM | POA: Diagnosis not present

## 2015-02-05 ENCOUNTER — Telehealth: Payer: Self-pay | Admitting: Nurse Practitioner

## 2015-02-05 NOTE — Telephone Encounter (Signed)
Patient needs samples of Ranexa to last him until the first of the year.Leaving samples of Ranexa at front desk for patient to pick-up.

## 2015-02-05 NOTE — Telephone Encounter (Signed)
Follow Up ° °Pt returned call//  °

## 2015-02-17 ENCOUNTER — Telehealth: Payer: Self-pay

## 2015-02-17 ENCOUNTER — Other Ambulatory Visit: Payer: Self-pay | Admitting: *Deleted

## 2015-02-17 ENCOUNTER — Telehealth: Payer: Self-pay | Admitting: *Deleted

## 2015-02-17 MED ORDER — CLOPIDOGREL BISULFATE 75 MG PO TABS: 75.0000 mg | ORAL_TABLET | Freq: Every day | ORAL | Status: DC

## 2015-02-17 MED ORDER — ATENOLOL 50 MG PO TABS: 50.0000 mg | ORAL_TABLET | Freq: Every day | ORAL | Status: DC

## 2015-02-17 MED ORDER — AMLODIPINE BESYLATE 5 MG PO TABS: 5.0000 mg | ORAL_TABLET | Freq: Every day | ORAL | Status: DC

## 2015-02-17 MED ORDER — RANOLAZINE ER 500 MG PO TB12: 500.0000 mg | ORAL_TABLET | Freq: Two times a day (BID) | ORAL | Status: DC

## 2015-02-17 NOTE — Telephone Encounter (Signed)
Burtis Junes, NP at 10/10/2014 8:02 AM  amLODipine (NORVASC) 5 MG tabletTake 1 tablet (5 mg total) by mouth daily clopidogrel (PLAVIX) 75 MG tabletTake 1 tablet (75 mg total) by mouth daily with breakfast atenolol (TENORMIN) 50 MG tabletTake 1 tablet (50 mg total) by mouth daily ranolazine (RANEXA) 500 MG 12 hr tabletTake 1 tablet (500 mg total) by mouth 2 (two) times daily Medication Instructions:    Continue with your current medicines.

## 2015-02-17 NOTE — Telephone Encounter (Signed)
S/w pt's wife per DPR pt is taking ( 15 mg ) daily of imdur, one half tablet.

## 2015-02-24 ENCOUNTER — Encounter (HOSPITAL_COMMUNITY): Payer: Self-pay | Admitting: Emergency Medicine

## 2015-02-24 ENCOUNTER — Other Ambulatory Visit: Payer: Self-pay

## 2015-02-24 ENCOUNTER — Emergency Department (HOSPITAL_COMMUNITY): Payer: PPO

## 2015-02-24 DIAGNOSIS — Z7982 Long term (current) use of aspirin: Secondary | ICD-10-CM | POA: Diagnosis not present

## 2015-02-24 DIAGNOSIS — Z7984 Long term (current) use of oral hypoglycemic drugs: Secondary | ICD-10-CM | POA: Diagnosis not present

## 2015-02-24 DIAGNOSIS — R079 Chest pain, unspecified: Secondary | ICD-10-CM | POA: Diagnosis not present

## 2015-02-24 DIAGNOSIS — I119 Hypertensive heart disease without heart failure: Secondary | ICD-10-CM | POA: Diagnosis not present

## 2015-02-24 DIAGNOSIS — Z7902 Long term (current) use of antithrombotics/antiplatelets: Secondary | ICD-10-CM | POA: Diagnosis not present

## 2015-02-24 DIAGNOSIS — Z955 Presence of coronary angioplasty implant and graft: Secondary | ICD-10-CM | POA: Diagnosis not present

## 2015-02-24 DIAGNOSIS — Z833 Family history of diabetes mellitus: Secondary | ICD-10-CM

## 2015-02-24 DIAGNOSIS — E1159 Type 2 diabetes mellitus with other circulatory complications: Secondary | ICD-10-CM | POA: Diagnosis not present

## 2015-02-24 DIAGNOSIS — E785 Hyperlipidemia, unspecified: Secondary | ICD-10-CM | POA: Diagnosis not present

## 2015-02-24 DIAGNOSIS — I77819 Aortic ectasia, unspecified site: Secondary | ICD-10-CM | POA: Diagnosis present

## 2015-02-24 DIAGNOSIS — G4733 Obstructive sleep apnea (adult) (pediatric): Secondary | ICD-10-CM | POA: Diagnosis present

## 2015-02-24 DIAGNOSIS — Z96611 Presence of right artificial shoulder joint: Secondary | ICD-10-CM | POA: Diagnosis not present

## 2015-02-24 DIAGNOSIS — I2510 Atherosclerotic heart disease of native coronary artery without angina pectoris: Secondary | ICD-10-CM | POA: Diagnosis not present

## 2015-02-24 DIAGNOSIS — Z8249 Family history of ischemic heart disease and other diseases of the circulatory system: Secondary | ICD-10-CM | POA: Diagnosis not present

## 2015-02-24 DIAGNOSIS — E1140 Type 2 diabetes mellitus with diabetic neuropathy, unspecified: Secondary | ICD-10-CM | POA: Diagnosis present

## 2015-02-24 DIAGNOSIS — I25110 Atherosclerotic heart disease of native coronary artery with unstable angina pectoris: Principal | ICD-10-CM | POA: Diagnosis present

## 2015-02-24 DIAGNOSIS — I10 Essential (primary) hypertension: Secondary | ICD-10-CM | POA: Diagnosis not present

## 2015-02-24 DIAGNOSIS — I200 Unstable angina: Secondary | ICD-10-CM | POA: Diagnosis not present

## 2015-02-24 DIAGNOSIS — K219 Gastro-esophageal reflux disease without esophagitis: Secondary | ICD-10-CM | POA: Diagnosis not present

## 2015-02-24 DIAGNOSIS — R0602 Shortness of breath: Secondary | ICD-10-CM | POA: Diagnosis not present

## 2015-02-24 DIAGNOSIS — I208 Other forms of angina pectoris: Secondary | ICD-10-CM | POA: Diagnosis not present

## 2015-02-24 LAB — BASIC METABOLIC PANEL
Anion gap: 11 (ref 5–15)
BUN: 17 mg/dL (ref 6–20)
CHLORIDE: 104 mmol/L (ref 101–111)
CO2: 24 mmol/L (ref 22–32)
Calcium: 9.5 mg/dL (ref 8.9–10.3)
Creatinine, Ser: 0.99 mg/dL (ref 0.61–1.24)
GFR calc non Af Amer: >60 mL/min (ref >60)
Glucose, Bld: 227 mg/dL — ABNORMAL HIGH (ref 65–99)
POTASSIUM: 3.9 mmol/L (ref 3.5–5.1)
SODIUM: 139 mmol/L (ref 135–145)

## 2015-02-24 LAB — CBC
HEMATOCRIT: 43 % (ref 39.0–52.0)
Hemoglobin: 14.8 g/dL (ref 13.0–17.0)
MCH: 30.5 pg (ref 26.0–34.0)
MCHC: 34.4 g/dL (ref 30.0–36.0)
MCV: 88.5 fL (ref 78.0–100.0)
Platelets: 130 10*3/uL — ABNORMAL LOW (ref 150–400)
RBC: 4.86 MIL/uL (ref 4.22–5.81)
RDW: 13.1 % (ref 11.5–15.5)
WBC: 3.9 10*3/uL — AB (ref 4.0–10.5)

## 2015-02-24 LAB — I-STAT TROPONIN, ED: Troponin i, poc: 0.01 ng/mL (ref 0.00–0.08)

## 2015-02-24 NOTE — ED Notes (Signed)
Pt states at 5pm this afternoon pt had a sudden onset of left sided chest pressure that was radiating into left arm and aching. Pt took 3 sl nitro tablets with no relief. Pt has cardiac hx with stents.

## 2015-02-25 ENCOUNTER — Inpatient Hospital Stay (HOSPITAL_COMMUNITY): Payer: PPO

## 2015-02-25 ENCOUNTER — Inpatient Hospital Stay (HOSPITAL_COMMUNITY)
Admission: EM | Admit: 2015-02-25 | Discharge: 2015-02-25 | DRG: 303 | Disposition: A | Discharge status: Home / Self Care | Payer: PPO | Attending: Internal Medicine | Admitting: Internal Medicine

## 2015-02-25 ENCOUNTER — Inpatient Hospital Stay (HOSPITAL_BASED_OUTPATIENT_CLINIC_OR_DEPARTMENT_OTHER): Payer: PPO

## 2015-02-25 DIAGNOSIS — K219 Gastro-esophageal reflux disease without esophagitis: Secondary | ICD-10-CM | POA: Diagnosis not present

## 2015-02-25 DIAGNOSIS — E1159 Type 2 diabetes mellitus with other circulatory complications: Secondary | ICD-10-CM | POA: Diagnosis not present

## 2015-02-25 DIAGNOSIS — R079 Chest pain, unspecified: Secondary | ICD-10-CM

## 2015-02-25 DIAGNOSIS — E785 Hyperlipidemia, unspecified: Secondary | ICD-10-CM | POA: Diagnosis not present

## 2015-02-25 DIAGNOSIS — I10 Essential (primary) hypertension: Secondary | ICD-10-CM | POA: Diagnosis present

## 2015-02-25 DIAGNOSIS — Z7982 Long term (current) use of aspirin: Secondary | ICD-10-CM | POA: Diagnosis not present

## 2015-02-25 DIAGNOSIS — E1140 Type 2 diabetes mellitus with diabetic neuropathy, unspecified: Secondary | ICD-10-CM | POA: Diagnosis not present

## 2015-02-25 DIAGNOSIS — Z7984 Long term (current) use of oral hypoglycemic drugs: Secondary | ICD-10-CM | POA: Diagnosis not present

## 2015-02-25 DIAGNOSIS — Z8249 Family history of ischemic heart disease and other diseases of the circulatory system: Secondary | ICD-10-CM | POA: Diagnosis not present

## 2015-02-25 DIAGNOSIS — I25110 Atherosclerotic heart disease of native coronary artery with unstable angina pectoris: Secondary | ICD-10-CM | POA: Diagnosis not present

## 2015-02-25 DIAGNOSIS — Z96611 Presence of right artificial shoulder joint: Secondary | ICD-10-CM | POA: Diagnosis not present

## 2015-02-25 DIAGNOSIS — Z833 Family history of diabetes mellitus: Secondary | ICD-10-CM | POA: Diagnosis not present

## 2015-02-25 DIAGNOSIS — Z955 Presence of coronary angioplasty implant and graft: Secondary | ICD-10-CM | POA: Diagnosis not present

## 2015-02-25 DIAGNOSIS — E119 Type 2 diabetes mellitus without complications: Secondary | ICD-10-CM | POA: Insufficient documentation

## 2015-02-25 DIAGNOSIS — I77819 Aortic ectasia, unspecified site: Secondary | ICD-10-CM | POA: Diagnosis not present

## 2015-02-25 DIAGNOSIS — I25118 Atherosclerotic heart disease of native coronary artery with other forms of angina pectoris: Secondary | ICD-10-CM

## 2015-02-25 DIAGNOSIS — I119 Hypertensive heart disease without heart failure: Secondary | ICD-10-CM | POA: Diagnosis not present

## 2015-02-25 DIAGNOSIS — R0789 Other chest pain: Secondary | ICD-10-CM

## 2015-02-25 DIAGNOSIS — I208 Other forms of angina pectoris: Secondary | ICD-10-CM | POA: Diagnosis present

## 2015-02-25 DIAGNOSIS — I2510 Atherosclerotic heart disease of native coronary artery without angina pectoris: Secondary | ICD-10-CM | POA: Diagnosis not present

## 2015-02-25 DIAGNOSIS — G4733 Obstructive sleep apnea (adult) (pediatric): Secondary | ICD-10-CM | POA: Diagnosis not present

## 2015-02-25 DIAGNOSIS — I200 Unstable angina: Secondary | ICD-10-CM

## 2015-02-25 DIAGNOSIS — Z7902 Long term (current) use of antithrombotics/antiplatelets: Secondary | ICD-10-CM | POA: Diagnosis not present

## 2015-02-25 LAB — GLUCOSE, CAPILLARY
Glucose-Capillary: 127 mg/dL — ABNORMAL HIGH (ref 65–99)
Glucose-Capillary: 126 mg/dL — ABNORMAL HIGH (ref 65–99)
Glucose-Capillary: 154 mg/dL — ABNORMAL HIGH (ref 65–99)
Glucose-Capillary: 153 mg/dL — ABNORMAL HIGH (ref 65–99)

## 2015-02-25 LAB — NM MYOCAR MULTI W/SPECT W/WALL MOTION / EF
CHL CUP NUCLEAR SRS: 6
CHL CUP RESTING HR STRESS: 56 {beats}/min
Exercise duration (min): 4 min
LV sys vol: 46 mL
LVDIAVOL: 121 mL
NUC STRESS TID: 1.53
RATE: 0.25
SDS: 0
SSS: 6

## 2015-02-25 LAB — TROPONIN I
Troponin I: <0.03 ng/mL (ref <0.031)
Troponin I: <0.03 ng/mL (ref <0.031)

## 2015-02-25 LAB — MRSA PCR SCREENING: MRSA by PCR: NEGATIVE

## 2015-02-25 MED ORDER — ACETAMINOPHEN 325 MG PO TABS: 650.0000 mg | ORAL_TABLET | ORAL | Status: DC | PRN

## 2015-02-25 MED ORDER — NITROGLYCERIN 0.4 MG SL SUBL: 0.4000 mg | SUBLINGUAL_TABLET | SUBLINGUAL | Status: DC | PRN

## 2015-02-25 MED ORDER — ONDANSETRON HCL 4 MG/2ML IJ SOLN: 4.0000 mg | Freq: Four times a day (QID) | INTRAMUSCULAR | Status: DC | PRN

## 2015-02-25 MED ORDER — ASPIRIN 81 MG PO CHEW: 81.0000 mg | CHEWABLE_TABLET | Freq: Every day | ORAL | Status: DC

## 2015-02-25 MED ORDER — HEPARIN (PORCINE) IN NACL 100-0.45 UNIT/ML-% IJ SOLN
1200.0000 [IU]/h | INTRAMUSCULAR | Status: DC
  Administered 2015-02-25: 1200 [IU]/h via INTRAVENOUS
  Filled 2015-02-25 (×2): qty 250

## 2015-02-25 MED ORDER — TRAMADOL HCL 50 MG PO TABS
50.0000 mg | ORAL_TABLET | Freq: Four times a day (QID) | ORAL | Status: DC | PRN
  Administered 2015-02-25: 50 mg via ORAL
  Filled 2015-02-25: qty 1

## 2015-02-25 MED ORDER — TECHNETIUM TC 99M SESTAMIBI GENERIC - CARDIOLITE
30.0000 | Freq: Once | INTRAVENOUS | Status: AC | PRN
  Administered 2015-02-25: 30 via INTRAVENOUS

## 2015-02-25 MED ORDER — MORPHINE SULFATE (PF) 4 MG/ML IV SOLN
4.0000 mg | Freq: Once | INTRAVENOUS | Status: AC
  Administered 2015-02-25: 4 mg via INTRAVENOUS
  Filled 2015-02-25: qty 1

## 2015-02-25 MED ORDER — ISOSORBIDE MONONITRATE 15 MG HALF TABLET
15.0000 mg | ORAL_TABLET | Freq: Every day | ORAL | Status: DC
  Filled 2015-02-25: qty 1

## 2015-02-25 MED ORDER — RANOLAZINE ER 500 MG PO TB12
500.0000 mg | ORAL_TABLET | Freq: Two times a day (BID) | ORAL | Status: DC
  Administered 2015-02-25: 500 mg via ORAL
  Filled 2015-02-25 (×4): qty 1

## 2015-02-25 MED ORDER — ATORVASTATIN CALCIUM 40 MG PO TABS: 40.0000 mg | ORAL_TABLET | Freq: Every day | ORAL | Status: DC

## 2015-02-25 MED ORDER — REGADENOSON 0.4 MG/5ML IV SOLN
0.4000 mg | Freq: Once | INTRAVENOUS | Status: AC
  Administered 2015-02-25: 0.4 mg via INTRAVENOUS
  Filled 2015-02-25: qty 5

## 2015-02-25 MED ORDER — GABAPENTIN 300 MG PO CAPS: 300.0000 mg | ORAL_CAPSULE | Freq: Every day | ORAL | Status: DC

## 2015-02-25 MED ORDER — PANTOPRAZOLE SODIUM 40 MG PO TBEC
40.0000 mg | DELAYED_RELEASE_TABLET | Freq: Every day | ORAL | Status: DC
  Administered 2015-02-25: 40 mg via ORAL
  Filled 2015-02-25: qty 1

## 2015-02-25 MED ORDER — NITROGLYCERIN IN D5W 200-5 MCG/ML-% IV SOLN
0.0000 ug/min | Freq: Once | INTRAVENOUS | Status: AC
  Administered 2015-02-25: 5 ug/min via INTRAVENOUS
  Filled 2015-02-25: qty 250

## 2015-02-25 MED ORDER — ISOSORBIDE MONONITRATE ER 30 MG PO TB24: 30.0000 mg | ORAL_TABLET | Freq: Every day | ORAL | Status: DC

## 2015-02-25 MED ORDER — IRBESARTAN 300 MG PO TABS: 150.0000 mg | ORAL_TABLET | Freq: Every day | ORAL | Status: DC

## 2015-02-25 MED ORDER — ASPIRIN 81 MG PO CHEW
324.0000 mg | CHEWABLE_TABLET | Freq: Once | ORAL | Status: AC
  Administered 2015-02-25: 324 mg via ORAL
  Filled 2015-02-25: qty 4

## 2015-02-25 MED ORDER — TECHNETIUM TC 99M SESTAMIBI GENERIC - CARDIOLITE
10.0000 | Freq: Once | INTRAVENOUS | Status: AC | PRN
  Administered 2015-02-25: 10 via INTRAVENOUS

## 2015-02-25 MED ORDER — AMLODIPINE BESYLATE 5 MG PO TABS
5.0000 mg | ORAL_TABLET | Freq: Every day | ORAL | Status: DC
  Administered 2015-02-25: 5 mg via ORAL
  Filled 2015-02-25: qty 1

## 2015-02-25 MED ORDER — LORAZEPAM 0.5 MG PO TABS: 0.5000 mg | ORAL_TABLET | Freq: Four times a day (QID) | ORAL | Status: DC | PRN

## 2015-02-25 MED ORDER — INSULIN ASPART 100 UNIT/ML ~~LOC~~ SOLN: 0.0000 [IU] | SUBCUTANEOUS | Status: DC

## 2015-02-25 MED ORDER — ACETAMINOPHEN ER 650 MG PO TBCR: 1300.0000 mg | EXTENDED_RELEASE_TABLET | Freq: Two times a day (BID) | ORAL | Status: DC | PRN

## 2015-02-25 MED ORDER — CLOPIDOGREL BISULFATE 75 MG PO TABS
75.0000 mg | ORAL_TABLET | Freq: Every day | ORAL | Status: DC
  Administered 2015-02-25: 75 mg via ORAL
  Filled 2015-02-25: qty 1

## 2015-02-25 MED ORDER — OXYCODONE HCL 5 MG PO TABS
5.0000 mg | ORAL_TABLET | Freq: Once | ORAL | Status: AC
  Administered 2015-02-25: 5 mg via ORAL
  Filled 2015-02-25: qty 1

## 2015-02-25 MED ORDER — HEPARIN BOLUS VIA INFUSION
4000.0000 [IU] | Freq: Once | INTRAVENOUS | Status: AC
  Administered 2015-02-25: 4000 [IU] via INTRAVENOUS
  Filled 2015-02-25: qty 4000

## 2015-02-25 MED ORDER — IRBESARTAN 150 MG PO TABS
150.0000 mg | ORAL_TABLET | Freq: Every day | ORAL | Status: DC
  Administered 2015-02-25: 150 mg via ORAL
  Filled 2015-02-25: qty 1

## 2015-02-25 MED ORDER — NITROGLYCERIN 2 % TD OINT
1.0000 [in_us] | TOPICAL_OINTMENT | Freq: Once | TRANSDERMAL | Status: DC
  Filled 2015-02-25: qty 1

## 2015-02-25 MED ORDER — REGADENOSON 0.4 MG/5ML IV SOLN
INTRAVENOUS | Status: AC
  Administered 2015-02-25: 0.4 mg via INTRAVENOUS
  Filled 2015-02-25: qty 5

## 2015-02-25 MED ORDER — ATENOLOL 25 MG PO TABS: 50.0000 mg | ORAL_TABLET | Freq: Every day | ORAL | Status: DC

## 2015-02-25 NOTE — Progress Notes (Signed)
Patient seen and examined. Admitted overnight secondary to CP, Heart score of 5. Required IV NTG drip and heparin drip initially. Cardiology on board and recommending nuclear stress test today. Patient currently is CP free and denies SOB. EKG w/o acute ischemic changes and negative troponin X 2 so far. Please refer to H&P written by Dr. Alcario Drought for further info/details on admission.   Plan: -nuclear stress test -continue ASA, plavix, B-blocker, low dose isosorbide and ranexa -continue statins -follow cardiology rec's  Hunter Mcbride [phone]

## 2015-02-25 NOTE — H&P (Signed)
[facility] History and Physical  Derris Millan OMV:[id number] DOB: 06-18-1945 DOA: 02/25/2015  Referring physician: EDP PCP: Jerlyn Ly, MD   Chief Complaint: Chest pain   HPI: Hunter Mcbride is a 70 y.o. male with h/o CAD, s/p 3x DES to the mid LAD in May of 2015, repeat caths with last study in Nov 2015 showed patent stents but diffuse disease (moderate, non-obstructive in Cx and RCA) elsewhere that are being managed medically.  Patient presents to the ED with chest pain described as a "pressure" like sensation in left chest, radiates to LUE.  3x NTG provided little relief, pain still 6/10 at this time.  Pain onset at 1700 and begain to improve at 2100 but still persists.  No h/o similar pain, no diaphoresis.  Given ASA 325 by ED.  Is currently on ASA 81 / plavix at home.  Review of Systems: Systems reviewed.  As above, otherwise negative  Past Medical History  Diagnosis Date  . Hyperlipidemia   . Hypertension   . Gout     "maybe twice in my life"  . GERD (gastroesophageal reflux disease)   . Myocardial infarction [facility])     "/Dr. Katharina Caper I've had one between 2014-2015) (06/21/2013)  . Asthma     "not sure if this is true or not" (06/21/2013)  . OSA on CPAP   . Type II diabetes mellitus ([place])   . Headache(784.0)     "weekly for the last 3-4 months" (06/21/2013)  . Arthritis     "lower back; right shoulder; left thumb; joints" (06/21/2013)  . Osteoarthritis   . CAD (coronary artery disease) May 2015    s/p PTCA/DES x 3 to mid LAD, mod non-ob disease in Cx and RCA May 2015; s/p repeat caths - last study in November of 2015 - stents patent, - FFR - managed medically.    Past Surgical History  Procedure Laterality Date  . Cardiovascular stress test  06/10/2008    EF 68%  . Carpal tunnel release Bilateral   . Excision morton's neuroma Left   . Tonsillectomy    . Eye surgery Left     "removed film over my eye"  . Total shoulder arthroplasty  03/01/2012    Procedure:  TOTAL SHOULDER ARTHROPLASTY;  Surgeon: Marin Shutter, MD;  Location: [place];  Service: Orthopedics;  Laterality: Right;  . Cardiac catheterization  1980's    "once"  . Coronary angioplasty with stent placement  06/21/2013    "3"  . Hernia repair Left   . Umbilical hernia repair    . Joint replacement Left 03/2013    "thumb"  . Left heart catheterization with coronary angiogram N/A 06/21/2013    Procedure: LEFT HEART CATHETERIZATION WITH CORONARY ANGIOGRAM;  Surgeon: Burnell Blanks, MD;  Location: [facility] CATH LAB;  Service: Cardiovascular;  Laterality: N/A;  . Left heart catheterization with coronary angiogram N/A 06/26/2013    Procedure: LEFT HEART CATHETERIZATION WITH CORONARY ANGIOGRAM;  Surgeon: [name], MD;  Location: [place] CATH LAB;  Service: Cardiovascular;  Laterality: N/A;  . Fractional flow reserve wire N/A 06/26/2013    Procedure: [place];  Surgeon: [name], MD;  Location: [place] CATH LAB;  Service: Cardiovascular;  Laterality: N/A;   Social History:  reports that he has never smoked. He has never used smokeless tobacco. He reports that he drinks alcohol. He reports that he does not use illicit drugs.  No Known Allergies  Family History  Problem Relation Age of Onset  . Diabetes type II Mother   . Heart attack Father   . Coronary artery disease Cousin   . Heart disease Brother      Prior to Admission medications   Medication Sig Start Date End Date Taking? Authorizing Provider  acetaminophen (TYLENOL ARTHRITIS PAIN) 650 MG CR tablet Take 1,300 mg by mouth 2 (two) times daily as needed. For pain   Yes Historical Provider, MD  amLODipine (NORVASC) 5 MG tablet Take 1 tablet (5 mg total) by mouth daily. 02/17/15  Yes Burtis Junes, NP  aspirin 81 MG tablet Take 81 mg by mouth at bedtime.    Yes Historical Provider, MD  atenolol (TENORMIN) 50 MG tablet Take 1 tablet (50 mg total) by mouth daily. 02/17/15  Yes Burtis Junes, NP  atorvastatin  (LIPITOR) 80 MG tablet Take 40 mg by mouth daily.    Yes Historical Provider, MD  chlorpheniramine-HYDROcodone (TUSSIONEX) 10-8 MG/5ML LQCR Take 5 mLs by mouth every 12 (twelve) hours as needed for cough.   Yes Historical Provider, MD  clopidogrel (PLAVIX) 75 MG tablet Take 1 tablet (75 mg total) by mouth daily with breakfast. 02/17/15  Yes Burtis Junes, NP  gabapentin (NEURONTIN) 300 MG capsule Take 300 mg by mouth at bedtime.     Yes Historical Provider, MD  hydrochlorothiazide (HYDRODIURIL) 25 MG tablet Take 12.5 mg by mouth 3 (three) times a week. Monday, Wednesday and Friday 05/28/13  Yes Historical Provider, MD  INVOKANA 100 MG TABS tablet Take 200 mg by mouth daily.  04/29/14  Yes Historical Provider, MD  irbesartan (AVAPRO) 300 MG tablet Take 1 tablet (300 mg total) by mouth daily. Patient taking differently: Take 150 mg by mouth daily.  10/10/14  Yes Burtis Junes, NP  isosorbide mononitrate (IMDUR) 30 MG 24 hr tablet Take 0.5 tablets (15 mg total) by mouth daily. 03/12/14  Yes Burtis Junes, NP  LORazepam (ATIVAN) 0.5 MG tablet Take 0.5 mg by mouth every 6 (six) hours as needed for anxiety.  06/01/13  Yes Historical Provider, MD  metFORMIN (GLUCOPHAGE-XR) 500 MG 24 hr tablet Take 1,000 mg by mouth 2 (two) times daily.  06/27/13  Yes Rhonda G Barrett, PA-C  nitroGLYCERIN (NITROSTAT) 0.4 MG SL tablet Place 1 tablet (0.4 mg total) under the tongue every 5 (five) minutes as needed. For chest pain 01/02/15  Yes Thayer Headings, MD  pantoprazole (PROTONIX) 40 MG tablet Take 40 mg by mouth daily.   Yes Historical Provider, MD  ranolazine (RANEXA) 500 MG 12 hr tablet Take 1 tablet (500 mg total) by mouth 2 (two) times daily. 02/17/15  Yes Burtis Junes, NP  traMADol (ULTRAM) 50 MG tablet Take 50 mg by mouth every 6 (six) hours as needed for moderate pain.  04/22/13  Yes Historical Provider, MD   Physical Exam: Filed Vitals:   02/25/15 0046 02/25/15 0145  BP: 185/96 142/84  Pulse:  99  Temp:     Resp:  13    BP 142/84 mmHg  Pulse 99  Temp(Src) 98.3 F (36.8 C) (Oral)  Resp 13  Ht '5\' 9"'$  (1.753 m)  Wt 86.183 kg (190 lb)  BMI 28.05 kg/m2  SpO2 93%  General Appearance:    Alert, oriented, no distress, appears stated age  Head:    Normocephalic, atraumatic  Eyes:    PERRL, EOMI, sclera non-icteric        Nose:   Nares without drainage or epistaxis. Mucosa,  turbinates normal  Throat:   Moist mucous membranes. Oropharynx without erythema or exudate.  Neck:   Supple. No carotid bruits.  No thyromegaly.  No lymphadenopathy.   Back:     No CVA tenderness, no spinal tenderness  Lungs:     Clear to auscultation bilaterally, without wheezes, rhonchi or rales  Chest wall:    No tenderness to palpitation  Heart:    Regular rate and rhythm without murmurs, gallops, rubs  Abdomen:     Soft, non-tender, nondistended, normal bowel sounds, no organomegaly  Genitalia:    deferred  Rectal:    deferred  Extremities:   No clubbing, cyanosis or edema.  Pulses:   2+ and symmetric all extremities  Skin:   Skin color, texture, turgor normal, no rashes or lesions  Lymph nodes:   Cervical, supraclavicular, and axillary nodes normal  Neurologic:   CNII-XII intact. Normal strength, sensation and reflexes      throughout    Labs on Admission:  Basic Metabolic Panel:  Recent Labs Lab 02/24/15 2128  NA 139  K 3.9  CL 104  CO2 24  GLUCOSE 227*  BUN 17  CREATININE 0.99  CALCIUM 9.5   Liver Function Tests: No results for input(s): AST, ALT, ALKPHOS, BILITOT, PROT, ALBUMIN in the last 168 hours. No results for input(s): LIPASE, AMYLASE in the last 168 hours. No results for input(s): AMMONIA in the last 168 hours. CBC:  Recent Labs Lab 02/24/15 2128  WBC 3.9*  HGB 14.8  HCT 43.0  MCV 88.5  PLT 130*   Cardiac Enzymes: No results for input(s): CKTOTAL, CKMB, CKMBINDEX, TROPONINI in the last 168 hours.  BNP (last 3 results) No results for input(s): PROBNP in the last 8760  hours. CBG: No results for input(s): GLUCAP in the last 168 hours.  Radiological Exams on Admission: Dg Chest 2 View  02/24/2015  CLINICAL DATA:  Left-sided chest pain, shortness of breath. EXAM: CHEST  2 VIEW COMPARISON:  Jun 25, 2013. FINDINGS: The heart size and mediastinal contours are within normal limits. No pneumothorax or pleural effusion is noted. Hypoinflation of the lungs is noted with minimal bibasilar subsegmental atelectasis. Status post right shoulder arthroplasty. IMPRESSION: Hypoinflation of the lungs with minimal bibasilar subsegmental atelectasis. Electronically Signed   By: Marijo Conception, M.D.   On: 02/24/2015 21:41    EKG: Independently reviewed.  Assessment/Plan Principal Problem:   Angina at rest [facility]) Active Problems:   Hypertension   Hyperlipidemia   Diabetes mellitus ([place])   Unstable angina (H[facility])   Coronary atherosclerosis of native coronary artery   1. Unstable angina - 1. Chest pain obs pathway 2. Serial trops 3. Cards already consulted by EDP: 1. Using NTG gtt to try and relief patients pain 2. Heparin gtt 4. Tele monitor 5. NPO 2. HTN - continue home meds 3. DM2 - low dose SSI Q4H while NPO 4. HLD - continue statin  Cardiology consulted, note pending.  Code Status: Full  Family Communication: Family at bedside Disposition Plan: Admit to inpatient   Time spent: 13 min  Tia Hieronymus M. [facility] Pager [phone]  If 7AM-7PM, please contact the day team taking care of the patient [email or web address] Password [email or web address] 02/25/2015, 2:01 AM

## 2015-02-25 NOTE — Discharge Summary (Signed)
Physician Discharge Summary  Hunter Mcbride OXB:[id number] DOB: May 05, 1945 DOA: 02/25/2015  PCP: Jerlyn Ly, MD  Admit date: 02/25/2015 Discharge date: 02/25/2015  Time spent: 35 minutes  Recommendations for Outpatient Follow-up:  Repeat BMET to follow electrolytes and renal function Reassess BP and adjust medications as needed for better control   Discharge Diagnoses:  Principal Problem:   Angina at rest [facility]) Active Problems:   Hypertension   Hyperlipidemia   Diabetes mellitus ([place])   Chest pain   Unstable angina ([place])   Coronary atherosclerosis of native coronary artery   Discharge Condition: stable and improved. No CP or SOB at discharge. Will follow up with cardiology and PCP as an outpatient.  Diet recommendation: low sodium and low carbohydrates diet   Filed Weights   02/24/15 2107 02/25/15 0325  Weight: 86.183 kg (190 lb) 88.905 kg (196 lb)    History of present illness:  71 y.o. male with h/o CAD, s/p 3x DES to the mid LAD in May of 2015, repeat caths with last study in Nov 2015 showed patent stents but diffuse disease (moderate, non-obstructive in Cx and RCA) elsewhere that are being managed medically.  Patient presents to the ED with chest pain described as a "pressure" like sensation in left chest, radiates to LUE. 3x NTG provided little relief, pain still 6/10 at this time. Pain onset at 1700 and begain to improve at 2100 but still persists. No h/o similar pain, no diaphoresis. Given ASA 325 by ED. Is currently on ASA 81 / plavix at home.  Hospital Course:  1-Chest pain: heart score 5 -neg troponin -No ischemic changes on EKG or telemetry  -Myoview neg and with low provability risk -per cardiology rec's will continue medical management  -continue ASA, plavix, b-blocker, ranexa, statins and imdur (last one increased to '30mg'$  daily) -patient will follow up with cardiology as an outpatient   2-Type 2 diabetes: -CBG's well controlled -will continue  metformin and Invokana  -advise to follow low carb diet  3-HTN: will continue current medication regimen  -BP fairly well controlled -advise to follow low sodium diet  4-HLD: -will continue statins  5-diabetic neuropathy  -will continue neurontin   6-GERD: -continue PPI  Procedures:  Myoview stress test:  -No T wave inversion was noted during stress. -There was no ST segment deviation noted during stress. -The study is normal. There is no ischemia. -This is a low risk study. -Nuclear stress EF: 62%.  Consultations:  Cardiology   Discharge Exam: Filed Vitals:   02/25/15 1127 02/25/15 1128  BP: 191/101   Pulse: 71 71  Temp:    Resp:      General: afebrile, no SOB and no further CP at discharge. Denies N/V and palpitations Cardiovascular: S1 and S2, no rubs or gallops Respiratory: CTA bilaterally Abd: soft, NT, ND, positive BS Ext: no edema or cyanosis    Discharge Instructions   Discharge Instructions    Diet - low sodium heart healthy    Complete by:  As directed      Discharge instructions    Complete by:  As directed   Take medications as prescribed Please follow up with cardiology service as instructed Follow low sodium/heart healthy and low carbohydrates diet Keep yourself well hydrated Arrange follow up with PCP in 2 weeks          Current Discharge Medication List    CONTINUE these medications which have CHANGED   Details  irbesartan (AVAPRO) 300 MG tablet Take 0.5 tablets (  150 mg total) by mouth daily. Qty: 90 tablet, Refills: 3    isosorbide mononitrate (IMDUR) 30 MG 24 hr tablet Take 1 tablet (30 mg total) by mouth daily.      CONTINUE these medications which have NOT CHANGED   Details  acetaminophen (TYLENOL ARTHRITIS PAIN) 650 MG CR tablet Take 1,300 mg by mouth 2 (two) times daily as needed. For pain    amLODipine (NORVASC) 5 MG tablet Take 1 tablet (5 mg total) by mouth daily. Qty: 90 tablet, Refills: 1    aspirin 81 MG tablet  Take 81 mg by mouth at bedtime.     atenolol (TENORMIN) 50 MG tablet Take 1 tablet (50 mg total) by mouth daily. Qty: 90 tablet, Refills: 1    atorvastatin (LIPITOR) 80 MG tablet Take 40 mg by mouth daily.     clopidogrel (PLAVIX) 75 MG tablet Take 1 tablet (75 mg total) by mouth daily with breakfast. Qty: 90 tablet, Refills: 1    gabapentin (NEURONTIN) 300 MG capsule Take 300 mg by mouth at bedtime.      hydrochlorothiazide (HYDRODIURIL) 25 MG tablet Take 12.5 mg by mouth 3 (three) times a week. Monday, Wednesday and Friday    INVOKANA 100 MG TABS tablet Take 200 mg by mouth daily.  Refills: 2    LORazepam (ATIVAN) 0.5 MG tablet Take 0.5 mg by mouth every 6 (six) hours as needed for anxiety.     metFORMIN (GLUCOPHAGE-XR) 500 MG 24 hr tablet Take 1,000 mg by mouth 2 (two) times daily.     nitroGLYCERIN (NITROSTAT) 0.4 MG SL tablet Place 1 tablet (0.4 mg total) under the tongue every 5 (five) minutes as needed. For chest pain Qty: 25 tablet, Refills: 5   Associated Diagnoses: Chest tightness    pantoprazole (PROTONIX) 40 MG tablet Take 40 mg by mouth daily.    ranolazine (RANEXA) 500 MG 12 hr tablet Take 1 tablet (500 mg total) by mouth 2 (two) times daily. Qty: 180 tablet, Refills: 1    traMADol (ULTRAM) 50 MG tablet Take 50 mg by mouth every 6 (six) hours as needed for moderate pain.       STOP taking these medications     chlorpheniramine-HYDROcodone (TUSSIONEX) 10-8 MG/5ML LQCR        No Known Allergies Follow-up Information    Follow up with Truitt Merle, NP On 04/08/2015.   Specialties:  Nurse Practitioner, Interventional Cardiology, Cardiology, Radiology   Why:  8:00 am   Contact information:   [address] [place] [address] [phone]       Follow up with Crist Infante A, MD. Schedule an appointment as soon as possible for a visit in 2 weeks.   Specialty:  Internal Medicine   Contact information:   [place]  [id number] [phone]        The results of significant diagnostics from this hospitalization (including imaging, microbiology, ancillary and laboratory) are listed below for reference.    Significant Diagnostic Studies: Dg Chest 2 View  02/24/2015  CLINICAL DATA:  Left-sided chest pain, shortness of breath. EXAM: CHEST  2 VIEW COMPARISON:  Jun 25, 2013. FINDINGS: The heart size and mediastinal contours are within normal limits. No pneumothorax or pleural effusion is noted. Hypoinflation of the lungs is noted with minimal bibasilar subsegmental atelectasis. Status post right shoulder arthroplasty. IMPRESSION: Hypoinflation of the lungs with minimal bibasilar subsegmental atelectasis. Electronically Signed   By: Marijo Conception, M.D.   On:  02/24/2015 21:41   Nm Myocar Multi W/spect W/wall Motion / Ef  02/25/2015   No T wave inversion was noted during stress.  There was no ST segment deviation noted during stress.  The study is normal. There is no ischemia.  This is a low risk study.  Nuclear stress EF: 62%.     Microbiology: Recent Results (from the past 240 hour(s))  MRSA PCR Screening     Status: None   Collection Time: 02/25/15  3:40 AM  Result Value Ref Range Status   MRSA by PCR NEGATIVE NEGATIVE Final    Comment:        The GeneXpert MRSA Assay (FDA approved for NASAL specimens only), is one component of a comprehensive MRSA colonization surveillance program. It is not intended to diagnose MRSA infection nor to guide or monitor treatment for MRSA infections.      Labs: Basic Metabolic Panel:  Recent Labs Lab 02/24/15 2128  NA 139  K 3.9  CL 104  CO2 24  GLUCOSE 227*  BUN 17  CREATININE 0.99  CALCIUM 9.5   CBC:  Recent Labs Lab 02/24/15 2128  WBC 3.9*  HGB 14.8  HCT 43.0  MCV 88.5  PLT 130*   Cardiac Enzymes:  Recent Labs Lab 02/25/15 0500 02/25/15 0840 02/25/15 1542  TROPONINI <0.03 <0.03 <0.03    CBG:  Recent Labs Lab 02/25/15 0411  02/25/15 0734 02/25/15 1237  GLUCAP 127* 126* 154*    Signed:  Barton Dubois MD.  [facility] 02/25/2015, 4:53 PM

## 2015-02-25 NOTE — ED Notes (Signed)
Hospital Bed ordered @ 0159.

## 2015-02-25 NOTE — Progress Notes (Signed)
UR Completed Sritha Chauncey Graves-Bigelow, RN,BSN [phone]  

## 2015-02-25 NOTE — Consult Note (Signed)
Admit date: 02/25/2015 Referring Physician  Dr. Alcario Drought Primary Physician Jerlyn Ly, MD Primary Cardiologist  Dr. Acie Fredrickson Reason for Consultation  Chest pain  HPI: 70 year old male with CAD post 3 prior PCI to mid LAD with repeat caths last cardiac cath in [place] 11/15, (moderate, non-obstructive in Cx and RCA) (FFR - (Reviewed by Dr. Burt Knack), normal EF and perfusion on  12/25/13 on NUC here with chest pain.   Described as pressure, left sided felt like a blood pressure cuff was inflating on his arm moderate that started yesterday at 5pm. He was standing there talking to a partner when it occurred. It made him nervous. Usually he states that his anginal equivalent is back pain immediately behind his heart. Dull throbbing ache, some relief originally with NTG but persistent in ER and started on NTG gtt as well as heparin.   Currently he is complaining of mild back discomfort but his symptom that brought him into the hospital has subsided.  He states that most of the males in his family died at age 37 from myocardial infarction.  No recent sick contacts, no nausea, no vomiting, no orthopnea, no sing to be, no bleeding, no fevers.  Was last seen on 10/10/14 by Truitt Merle NP. Has not tolerated Scaf[place] acting NTG in the past.   To quote her note:  "He presented back in the spring with a 2 month history of exertional chest pain - had abnormal Myoview - then cathed and had DES x 3 to the LAD. Otherwise had moderate but nonobstructive disease With a normal EF of 55 to 60%. He was discharged 06/22/13. On Plavix. After his initial event - he went back to the ER and has been recathed - to continue with medical management. He has not tolerated [place] active NTG.   In November of 2015 - he went hunting with his son. He had been cutting trees and setting up stands. Developed SSCP that initially resolved but then returned, subsequently associated with SOB and nausea - ended up going to the hospital in  [place] (after first presenting to an urgent care like facility that did not offer cardiac cath) - was recathed - actually twice. Had normal left heart pressures, patent stents extending from the proximal to the distal LAD and severe diffuse disease involving the branches of all 3 major epicardial vessels. No clear culprit lesion to intervene on - then had Myoview which showed normal perfusion and normal EF at 59%. Was recathed and had FFR of the first OM, posterior descending and distal RCA which were nonsignificant - no PCI was performed. CXR was normal. Echo was also updated - normal EF - mildly dilated aorta noted with mild LVH. I did not get a discharge summary - they brought a RX in for 1000 mg of Ranexa - this was not filled.  I saw him back after his return from [place]. He reiterated his story again about what happened to him in [place]. He did probably over exert him self - had walked 6 1/2 miles in the process of putting up the stands/cutting trees, etc. Had pretty classic chest pain associated with dyspnea and feeling of "impending doom". He notes that his symptoms seemed to abate after the second cath. He did take the higher dose of Ranexa but had some side effects (low BP, couldn't think, stomach upset, dizziness) and cut the dose back - now back to his prior dose.   Since that trip - he had had  some fleeting pain - he did bring his cath CDs and these were reviewed by Dr. Burt Knack. He agreed with continued medical therapy. Films were loaded in the [facility] system. Last seen in January of 2016. Due to some continued spells of angina - I rechallenged him with Imdur. Seen back in April and was doing ok but still with some rare spells of chest pain.   Comes back today. Here with [name], his wife. He has used NTG maybe on 4 occasions since last visit - seemed to be moreso at rest but after exertion. NTG does relieve his discomfort. Staying active. Not as hungry - has lost weight. He has always had this  tendency to "over do" - very physical in his day to day activities. Really does not pace himself despite multiple times recommended."    PMH:   Past Medical History  Diagnosis Date  . Hyperlipidemia   . Hypertension   . Gout     "maybe twice in my life"  . GERD (gastroesophageal reflux disease)   . Myocardial infarction [facility])     "/Dr. Katharina Caper I've had one between 2014-2015) (06/21/2013)  . Asthma     "not sure if this is true or not" (06/21/2013)  . OSA on CPAP   . Type II diabetes mellitus ([place])   . Headache(784.0)     "weekly for the last 3-4 months" (06/21/2013)  . Arthritis     "lower back; right shoulder; left thumb; joints" (06/21/2013)  . Osteoarthritis   . CAD (coronary artery disease) May 2015    s/p PTCA/DES x 3 to mid LAD, mod non-ob disease in Cx and RCA May 2015; s/p repeat caths - last study in November of 2015 - stents patent, - FFR - managed medically.     PSH:   Past Surgical History  Procedure Laterality Date  . Cardiovascular stress test  06/10/2008    EF 68%  . Carpal tunnel release Bilateral   . Excision morton's neuroma Left   . Tonsillectomy    . Eye surgery Left     "removed film over my eye"  . Total shoulder arthroplasty  03/01/2012    Procedure: TOTAL SHOULDER ARTHROPLASTY;  Surgeon: Marin Shutter, MD;  Location: [place];  Service: Orthopedics;  Laterality: Right;  . Cardiac catheterization  1980's    "once"  . Coronary angioplasty with stent placement  06/21/2013    "3"  . Hernia repair Left   . Umbilical hernia repair    . Joint replacement Left 03/2013    "thumb"  . Left heart catheterization with coronary angiogram N/A 06/21/2013    Procedure: LEFT HEART CATHETERIZATION WITH CORONARY ANGIOGRAM;  Surgeon: Burnell Blanks, MD;  Location[facility] C[facility] LAB;  Service: Cardiovascular;  Laterality: N/A;  . Left heart catheterization with coronary angiogram N/A 06/26/2013    Procedure: LEFT HEART CATHETERIZATION WITH CORONARY ANGIOGRAM;  Surgeon: [name], MD;  Location: [place] CATH LAB;  Service: Cardiovascular;  Laterality: N/A;  . Fractional flow reserve wire N/A 06/26/2013    Procedure: [place];  Surgeon: [name], MD;  Location: [facility] CATH LAB;  Service: Cardiovascular;  Laterality: N/A;   Allergies:  Review of patient's allergies indicates no known allergies. Prior to Admit Meds:   Prior to Admission medications   Medication Sig Start Date End Date Taking? Authorizing Provider  acetaminophen (TYLENOL ARTHRITIS PAIN) 650 MG CR tablet Take 1,300 mg by mouth 2 (two) times daily as needed. For  pain   Yes Historical Provider, MD  amLODipine (NORVASC) 5 MG tablet Take 1 tablet (5 mg total) by mouth daily. 02/17/15  Yes Burtis Junes, NP  aspirin 81 MG tablet Take 81 mg by mouth at bedtime.    Yes Historical Provider, MD  atenolol (TENORMIN) 50 MG tablet Take 1 tablet (50 mg total) by mouth daily. 02/17/15  Yes Burtis Junes, NP  atorvastatin (LIPITOR) 80 MG tablet Take 40 mg by mouth daily.    Yes Historical Provider, MD  chlorpheniramine-HYDROcodone (TUSSIONEX) 10-8 MG/5ML LQCR Take 5 mLs by mouth every 12 (twelve) hours as needed for cough.   Yes Historical Provider, MD  clopidogrel (PLAVIX) 75 MG tablet Take 1 tablet (75 mg total) by mouth daily with breakfast. 02/17/15  Yes Burtis Junes, NP  gabapentin (NEURONTIN) 300 MG capsule Take 300 mg by mouth at bedtime.     Yes Historical Provider, MD  hydrochlorothiazide (HYDRODIURIL) 25 MG tablet Take 12.5 mg by mouth 3 (three) times a week. Monday, Wednesday and Friday 05/28/13  Yes Historical Provider, MD  INVOKANA 100 MG TABS tablet Take 200 mg by mouth daily.  04/29/14  Yes Historical Provider, MD  irbesartan (AVAPRO) 300 MG tablet Take 1 tablet (300 mg total) by mouth daily. Patient taking differently: Take 150 mg by mouth daily.  10/10/14  Yes Burtis Junes, NP  isosorbide mononitrate (IMDUR) 30 MG 24 hr tablet Take 0.5 tablets (15 mg total) by mouth daily. 03/12/14   Yes Burtis Junes, NP  LORazepam (ATIVAN) 0.5 MG tablet Take 0.5 mg by mouth every 6 (six) hours as needed for anxiety.  06/01/13  Yes Historical Provider, MD  metFORMIN (GLUCOPHAGE-XR) 500 MG 24 hr tablet Take 1,000 mg by mouth 2 (two) times daily.  06/27/13  Yes Rhonda G Barrett, PA-C  nitroGLYCERIN (NITROSTAT) 0.4 MG SL tablet Place 1 tablet (0.4 mg total) under the tongue every 5 (five) minutes as needed. For chest pain 01/02/15  Yes Thayer Headings, MD  pantoprazole (PROTONIX) 40 MG tablet Take 40 mg by mouth daily.   Yes Historical Provider, MD  ranolazine (RANEXA) 500 MG 12 hr tablet Take 1 tablet (500 mg total) by mouth 2 (two) times daily. 02/17/15  Yes Burtis Junes, NP  traMADol (ULTRAM) 50 MG tablet Take 50 mg by mouth every 6 (six) hours as needed for moderate pain.  04/22/13  Yes Historical Provider, MD   Fam HX:    Family History  Problem Relation Age of Onset  . Diabetes type II Mother   . Heart attack Father   . Coronary artery disease Cousin   . Heart disease Brother    Social HX:    Social History   Social History  . Marital Status: Married    Spouse Name: N/A  . Number of Children: N/A  . Years of Education: N/A   Occupational History  . Not on file.   Social History Main Topics  . Smoking status: Never Smoker   . Smokeless tobacco: Never Used  . Alcohol Use: Yes     Comment: "used to drink a little beer occasionally; never was a drinker; nothing now"  . Drug Use: No  . Sexual Activity: Yes   Other Topics Concern  . Not on file   Social History Narrative     ROS:  All 11 ROS were addressed and are negative except what is stated in the HPI   Physical Exam: Blood pressure 143/89, pulse 47,  temperature 97.5 F (36.4 C), temperature source Oral, resp. rate 13, height '5\' 9"'$  (1.753 m), weight 196 lb (88.905 kg), SpO2 97 %.   General: Well developed, well nourished, in no acute distress Head: Eyes PERRLA, No xanthomas.   Normal cephalic and  atramatic  Lungs:   Clear bilaterally to auscultation and percussion. Normal respiratory effort. No wheezes, no rales. Heart:   HRRR S1 S2 Pulses are 2+ & equal. No murmur, rubs, gallops.  No carotid bruit. No JVD.  No abdominal bruits.  Abdomen: Bowel sounds are positive, abdomen soft and non-tender without masses. No hepatosplenomegaly. Msk:  Back normal. Normal strength and tone for age. Extremities:  No clubbing, cyanosis or edema.  DP +1 Neuro: Alert and oriented X 3, non-focal, MAE x 4 GU: Deferred Rectal: Deferred Psych:  Good affect, responds appropriately      Labs: Lab Results  Component Value Date   WBC 3.9* 02/24/2015   HGB 14.8 02/24/2015   HCT 43.0 02/24/2015   MCV 88.5 02/24/2015   PLT 130* 02/24/2015     Recent Labs Lab 02/24/15 2128  NA 139  K 3.9  CL 104  CO2 24  BUN 17  CREATININE 0.99  CALCIUM 9.5  GLUCOSE 227*   No results for input(s): CKTOTAL, CKMB, TROPONINI in the last 72 hours. Lab Results  Component Value Date   CHOL 123 04/11/2011   HDL 41.60 04/11/2011   LDLCALC 58 04/11/2011   TRIG 119.0 04/11/2011   No results found for: DDIMER   Radiology:  Dg Chest 2 View  02/24/2015  CLINICAL DATA:  Left-sided chest pain, shortness of breath. EXAM: CHEST  2 VIEW COMPARISON:  Jun 25, 2013. FINDINGS: The heart size and mediastinal contours are within normal limits. No pneumothorax or pleural effusion is noted. Hypoinflation of the lungs is noted with minimal bibasilar subsegmental atelectasis. Status post right shoulder arthroplasty. IMPRESSION: Hypoinflation of the lungs with minimal bibasilar subsegmental atelectasis. Electronically Signed   By: Marijo Conception, M.D.   On: 02/24/2015 21:41   Personally viewed.  EKG:  02/24/15 - NSR with NSSTW changes. No significant change from prior.  Personally viewed.   ASSESSMENT/PLAN:    70 year old male with 3 stents to mid LAD and non obstructive CAD otherwise here with angina, chest pain.   Angina  - On  heparin IV and NTG IV  - ECG no significant changes  - multiple cardiac caths in past, last in 5/15 [place], FFR -  - NUC 11/15 - no ischemia, normal EF  - troponin here negative x 2  - We discussed potential options with him and we will proceed with nuclear stress test. If low risk, I'm comfortable with discharge later today.  - His prior nuclear stress test was normal/low risk with no ischemia and a cardiac catheterization immediately following this did not demonstrate any flow limiting disease. If his nuclear stress test today is also low risk, hopefully we can infer that no flow-limiting disease is present as well as.  - Continue with beta blocker, amlodipine, low-dose isosorbide, Ranexa.  - We will stop IV heparin and IV nitroglycerin.   CAD  - as above  - has been on Plavix and ASA  - Bb  DM  - per primary team  Hyperlipidemia  - statin, high-intensity   Candee Furbish, MD  02/25/2015  6:02 AM

## 2015-02-25 NOTE — Progress Notes (Signed)
ANTICOAGULATION CONSULT NOTE - Initial Consult  Pharmacy Consult for Heparin Indication: chest pain/ACS  No Known Allergies  Patient Measurements: Height: '5\' 9"'$  (175.3 cm) Weight: 190 lb (86.183 kg) IBW/kg (Calculated) : 70.7  Vital Signs: Temp: 98.3 F (36.8 C) (01/10 2310) Temp Source: Oral (01/10 2310) BP: 185/96 mmHg (01/11 0046) Pulse Rate: 49 (01/11 0039)  Labs:  Recent Labs  02/24/15 2128  HGB 14.8  HCT 43.0  PLT 130*  CREATININE 0.99    Estimated Creatinine Clearance: 77.7 mL/min (by C-G formula based on Cr of 0.99).   Medical History: Past Medical History  Diagnosis Date  . Hyperlipidemia   . Hypertension   . Gout     "maybe twice in my life"  . GERD (gastroesophageal reflux disease)   . Myocardial infarction [facility])     "/Dr. Katharina Caper I've had one between 2014-2015) (06/21/2013)  . Asthma     "not sure if this is true or not" (06/21/2013)  . OSA on CPAP   . Type II diabetes mellitus ([place])   . Headache(784.0)     "weekly for the last 3-4 months" (06/21/2013)  . Arthritis     "lower back; right shoulder; left thumb; joints" (06/21/2013)  . Osteoarthritis   . CAD (coronary artery disease) May 2015    s/p PTCA/DES x 3 to mid LAD, mod non-ob disease in Cx and RCA May 2015; s/p repeat caths - last study in November of 2015 - stents patent, - FFR - managed medically.     Medications:  Norvasc  Atenolol  Plavix  Avapro  Imdur  Ntg  Ranexa  ASA  Lipitor  Neurontin  HCTZ  Invokana  Ativan  Metformin  Protonix  Ultram  Assessment: 70 y.o. male with chest pain for heparin   Goal of Therapy:  Heparin level 0.3-0.7 units/ml Monitor platelets by anticoagulation protocol: Yes   Plan:  Heparin 4000 units IV bolus, then start heparin 1200 units/hr Check heparin level in 6 hours.   Hunter Mcbride, Bronson Curb 02/25/2015,1:31 AM

## 2015-02-25 NOTE — ED Provider Notes (Signed)
CSN: [id number]     Arrival date & time 02/24/15  2046 History  By signing my name below, I, Evelene Croon, attest that this documentation has been prepared under the direction and in the presence of Merryl Hacker, MD . Electronically Signed: Evelene Croon, Scribe. 02/25/2015. 12:41 AM.    Chief Complaint  Patient presents with  . Chest Pain   The history is provided by the patient. No language interpreter was used.   HPI Comments:  Dorrance Sellick is a 70 y.o. male with a history of HTN, HLD, DM, CAD, coronary stents x 3, and GERD, who presents to the Emergency Department complaining of left sided CP since ~ 1700 today; notes he was standing at onset. He describes a dull aching throbbing pain that radiates to his LUE. His pain began to improve ~2100; states pain at this time is more of a pressure rated ~ 6/10. He has taken 3 NTG with little relief. Pt denies h/o similar pain.He reports associated mild dizziness and nausea. Pt is currently on plavix and '81mg'$  ASA daily; has taken daily '81mg'$ . He denies diaphoresis, BLE edema, and SOB. Pt also denies recent travel and recent hospitalization. Last cardiac catheterization 12/2013.   I have reviewed the patient's chart. Cardiologist is Dr. Cathie Olden.  Cardiac catheterization in May 2015 with 3 drug-eluting stents. Subsequent catheterization showed no intervenable lesions but diffuse disease otherwise. He was catheterized in [place] in November 2015.  Continued medical management at that time. It appears that he has had both exertional and nonexertional chest pain in the interim per cardiology notes.  Past Medical History  Diagnosis Date  . Hyperlipidemia   . Hypertension   . Gout     "maybe twice in my life"  . GERD (gastroesophageal reflux disease)   . Myocardial infarction [facility])     "/Dr. Katharina Caper I've had one between 2014-2015) (06/21/2013)  . Asthma     "not sure if this is true or not" (06/21/2013)  . OSA on CPAP   . Type II diabetes mellitus  ([place])   . Headache(784.0)     "weekly for the last 3-4 months" (06/21/2013)  . Arthritis     "lower back; right shoulder; left thumb; joints" (06/21/2013)  . Osteoarthritis   . CAD (coronary artery disease) May 2015    s/p PTCA/DES x 3 to mid LAD, mod non-ob disease in Cx and RCA May 2015; s/p repeat caths - last study in November of 2015 - stents patent, - FFR - managed medically.    Past Surgical History  Procedure Laterality Date  . Cardiovascular stress test  06/10/2008    EF 68%  . Carpal tunnel release Bilateral   . Excision morton's neuroma Left   . Tonsillectomy    . Eye surgery Left     "removed film over my eye"  . Total shoulder arthroplasty  03/01/2012    Procedure: TOTAL SHOULDER ARTHROPLASTY;  Surgeon: Marin Shutter, MD;  Location: [place];  Service: Orthopedics;  Laterality: Right;  . Cardiac catheterization  1980's    "once"  . Coronary angioplasty with stent placement  06/21/2013    "3"  . Hernia repair Left   . Umbilical hernia repair    . Joint replacement Left 03/2013    "thumb"  . Left heart catheterization with coronary angiogram N/A 06/21/2013    Procedure: LEFT HEART CATHETERIZATION WITH CORONARY ANGIOGRAM;  Surgeon: Burnell Blanks, MD;  Location: [facility] CATH LAB;  Service: Cardiovascular;  Laterality: N/A;  . Left heart catheterization with coronary angiogram N/A 06/26/2013    Procedure: LEFT HEART CATHETERIZATION WITH CORONARY ANGIOGRAM;  Surgeon: [name], MD;  Location: [place] CATH LAB;  Service: Cardiovascular;  Laterality: N/A;  . Fractional flow reserve wire N/A 06/26/2013    Procedure: [place];  Surgeon: [name], MD;  Location: [place] CATH LAB;  Service: Cardiovascular;  Laterality: N/A;   Family History  Problem Relation Age of Onset  . Diabetes type II Mother   . Heart attack Father   . Coronary artery disease Cousin   . Heart disease Brother    Social History  Substance Use Topics  . Smoking status: Never Smoker   .  Smokeless tobacco: Never Used  . Alcohol Use: Yes     Comment: "used to drink a little beer occasionally; never was a drinker; nothing now"    Review of Systems  Constitutional: Negative for diaphoresis.  Respiratory: Negative for shortness of breath.   Cardiovascular: Positive for chest pain. Negative for leg swelling.  Gastrointestinal: Positive for nausea. Negative for vomiting.  Neurological: Positive for dizziness.  All other systems reviewed and are negative.  Allergies  Review of patient's allergies indicates no known allergies.  Home Medications   Prior to Admission medications   Medication Sig Start Date End Date Taking? Authorizing Provider  acetaminophen (TYLENOL ARTHRITIS PAIN) 650 MG CR tablet Take 1,300 mg by mouth 2 (two) times daily as needed. For pain   Yes Historical Provider, MD  amLODipine (NORVASC) 5 MG tablet Take 1 tablet (5 mg total) by mouth daily. 02/17/15  Yes Burtis Junes, NP  aspirin 81 MG tablet Take 81 mg by mouth at bedtime.    Yes Historical Provider, MD  atenolol (TENORMIN) 50 MG tablet Take 1 tablet (50 mg total) by mouth daily. 02/17/15  Yes Burtis Junes, NP  atorvastatin (LIPITOR) 80 MG tablet Take 40 mg by mouth daily.    Yes Historical Provider, MD  chlorpheniramine-HYDROcodone (TUSSIONEX) 10-8 MG/5ML LQCR Take 5 mLs by mouth every 12 (twelve) hours as needed for cough.   Yes Historical Provider, MD  clopidogrel (PLAVIX) 75 MG tablet Take 1 tablet (75 mg total) by mouth daily with breakfast. 02/17/15  Yes Burtis Junes, NP  gabapentin (NEURONTIN) 300 MG capsule Take 300 mg by mouth at bedtime.     Yes Historical Provider, MD  hydrochlorothiazide (HYDRODIURIL) 25 MG tablet Take 12.5 mg by mouth 3 (three) times a week. Monday, Wednesday and Friday 05/28/13  Yes Historical Provider, MD  INVOKANA 100 MG TABS tablet Take 200 mg by mouth daily.  04/29/14  Yes Historical Provider, MD  irbesartan (AVAPRO) 300 MG tablet Take 1 tablet (300 mg total) by mouth  daily. Patient taking differently: Take 150 mg by mouth daily.  10/10/14  Yes Burtis Junes, NP  isosorbide mononitrate (IMDUR) 30 MG 24 hr tablet Take 0.5 tablets (15 mg total) by mouth daily. 03/12/14  Yes Burtis Junes, NP  LORazepam (ATIVAN) 0.5 MG tablet Take 0.5 mg by mouth every 6 (six) hours as needed for anxiety.  06/01/13  Yes Historical Provider, MD  metFORMIN (GLUCOPHAGE-XR) 500 MG 24 hr tablet Take 1,000 mg by mouth 2 (two) times daily.  06/27/13  Yes Rhonda G Barrett, PA-C  nitroGLYCERIN (NITROSTAT) 0.4 MG SL tablet Place 1 tablet (0.4 mg total) under the tongue every 5 (five) minutes as needed. For chest pain 01/02/15  Yes Thayer Headings, MD  pantoprazole (PROTONIX) 40 MG tablet Take 40 mg by mouth daily.   Yes Historical Provider, MD  ranolazine (RANEXA) 500 MG 12 hr tablet Take 1 tablet (500 mg total) by mouth 2 (two) times daily. 02/17/15  Yes Burtis Junes, NP  traMADol (ULTRAM) 50 MG tablet Take 50 mg by mouth every 6 (six) hours as needed for moderate pain.  04/22/13  Yes Historical Provider, MD   BP 185/96 mmHg  Pulse 49  Temp(Src) 98.3 F (36.8 C) (Oral)  Resp 18  Ht '5\' 9"'$  (1.753 m)  Wt 190 lb (86.183 kg)  BMI 28.05 kg/m2  SpO2 99% Physical Exam  Constitutional: He is oriented to person, place, and time. He appears well-developed and well-nourished. No distress.  HENT:  Head: Normocephalic and atraumatic.  Cardiovascular: Normal rate, regular rhythm and normal heart sounds.   No murmur heard. Pulmonary/Chest: Effort normal and breath sounds normal. No respiratory distress. He has no wheezes. He exhibits no tenderness.  Abdominal: Soft. Bowel sounds are normal. There is no tenderness. There is no rebound.  Genitourinary:  Right groin tenderness to palpation over the inguinal area, no masses palpated, no overlying skin changes, no scrotal masses  Musculoskeletal:  Trace bilateral lower extremity edema  Neurological: He is alert and oriented to person, place, and  time.  Skin: Skin is warm and dry.  Psychiatric: He has a normal mood and affect.  Nursing note and vitals reviewed.   ED Course  Procedures   CRITICAL CARE Performed by: Merryl Hacker   Total critical care time: 30 minutes  Critical care time was exclusive of separately billable procedures and treating other patients.  Critical care was necessary to treat or prevent imminent or life-threatening deterioration.  Critical care was time spent personally by me on the following activities: development of treatment plan with patient and/or surrogate as well as nursing, discussions with consultants, evaluation of patient's response to treatment, examination of patient, obtaining history from patient or surrogate, ordering and performing treatments and interventions, ordering and review of laboratory studies, ordering and review of radiographic studies, pulse oximetry and re-evaluation of patient's condition.   DIAGNOSTIC STUDIES:  Oxygen Saturation is 99% on RA, normal by my interpretation.    COORDINATION OF CARE:  12:38 AM Pt updated with results. Discussed treatment plan with pt at bedside and pt agreed to plan.  Labs Review Labs Reviewed  BASIC METABOLIC PANEL - Abnormal; Notable for the following:    Glucose, Bld 227 (*)    All other components within normal limits  CBC - Abnormal; Notable for the following:    WBC 3.9 (*)    Platelets 130 (*)    All other components within normal limits  I-STAT TROPOININ, ED    Imaging Review Dg Chest 2 View  02/24/2015  CLINICAL DATA:  Left-sided chest pain, shortness of breath. EXAM: CHEST  2 VIEW COMPARISON:  Jun 25, 2013. FINDINGS: The heart size and mediastinal contours are within normal limits. No pneumothorax or pleural effusion is noted. Hypoinflation of the lungs is noted with minimal bibasilar subsegmental atelectasis. Status post right shoulder arthroplasty. IMPRESSION: Hypoinflation of the lungs with minimal bibasilar  subsegmental atelectasis. Electronically Signed   By: Marijo Conception, M.D.   On: 02/24/2015 21:41   I have personally reviewed and evaluated these images and lab results as part of my medical decision-making.   EKG Interpretation   Date/Time:  Tuesday February 24 2015 21:02:48 EST Ventricular Rate:  62 PR Interval:  206 QRS Duration: 94 QT Interval:  410 QTC Calculation: 416 R Axis:   29 Text Interpretation:  Normal sinus rhythm No significant change was found  Confirmed by HORTON  MD, COURTNEY ([id number]) on 02/25/2015 12:13:05 AM      MDM   Final diagnoses:  Unstable angina [facility] [place])    Patient presents with chest pain. Character of chest pain somewhat concerning for ACS. Patient has a history of coronary artery disease with multiple risk factors and prior stents. EKG is initially reassuring. Basic labwork sent from triage including troponin is negative at this time. Patient does have ongoing pain. It appears that he has had both pain at rest and with exertion since his stents were placed in May 2015. Patient was given full dose aspirin, morphine. Discussed with Dr. Marlou Porch.  He agrees with trying to get the patient chest pain-free with nitroglycerin. Patient will be placed on nitroglycerin drip as well as heparin drip per ACS protocol. Will admit to the hospitalist for chest pain rule out. Formal cardiology consult to follow.  I personally performed the services described in this documentation, which was scribed in my presence. The recorded information has been reviewed and is accurate.    Merryl Hacker, MD 02/25/15 [phone]

## 2015-02-25 NOTE — ED Notes (Signed)
[place] hospital Bed @ [phone].

## 2015-03-06 DIAGNOSIS — I208 Other forms of angina pectoris: Secondary | ICD-10-CM | POA: Diagnosis not present

## 2015-03-06 DIAGNOSIS — E784 Other hyperlipidemia: Secondary | ICD-10-CM | POA: Diagnosis not present

## 2015-03-06 DIAGNOSIS — J209 Acute bronchitis, unspecified: Secondary | ICD-10-CM | POA: Insufficient documentation

## 2015-03-09 DIAGNOSIS — E784 Other hyperlipidemia: Secondary | ICD-10-CM | POA: Diagnosis not present

## 2015-03-26 DIAGNOSIS — E119 Type 2 diabetes mellitus without complications: Secondary | ICD-10-CM | POA: Diagnosis not present

## 2015-03-26 DIAGNOSIS — Z6828 Body mass index (BMI) 28.0-28.9, adult: Secondary | ICD-10-CM | POA: Diagnosis not present

## 2015-03-26 DIAGNOSIS — I208 Other forms of angina pectoris: Secondary | ICD-10-CM | POA: Diagnosis not present

## 2015-03-26 DIAGNOSIS — F418 Other specified anxiety disorders: Secondary | ICD-10-CM | POA: Diagnosis not present

## 2015-03-26 DIAGNOSIS — I10 Essential (primary) hypertension: Secondary | ICD-10-CM | POA: Diagnosis not present

## 2015-03-26 DIAGNOSIS — J208 Acute bronchitis due to other specified organisms: Secondary | ICD-10-CM | POA: Diagnosis not present

## 2015-03-26 DIAGNOSIS — I2510 Atherosclerotic heart disease of native coronary artery without angina pectoris: Secondary | ICD-10-CM | POA: Diagnosis not present

## 2015-03-26 DIAGNOSIS — E784 Other hyperlipidemia: Secondary | ICD-10-CM | POA: Diagnosis not present

## 2015-04-08 ENCOUNTER — Encounter: Payer: Self-pay | Admitting: Nurse Practitioner

## 2015-04-08 ENCOUNTER — Ambulatory Visit (INDEPENDENT_AMBULATORY_CARE_PROVIDER_SITE_OTHER): Payer: PPO | Admitting: Nurse Practitioner

## 2015-04-08 VITALS — BP 128/72 | HR 64 | Ht 69.0 in | Wt 195.8 lb

## 2015-04-08 DIAGNOSIS — I10 Essential (primary) hypertension: Secondary | ICD-10-CM | POA: Diagnosis not present

## 2015-04-08 DIAGNOSIS — E785 Hyperlipidemia, unspecified: Secondary | ICD-10-CM

## 2015-04-08 DIAGNOSIS — I259 Chronic ischemic heart disease, unspecified: Secondary | ICD-10-CM

## 2015-04-08 MED ORDER — ISOSORBIDE MONONITRATE ER 30 MG PO TB24: 30.0000 mg | ORAL_TABLET | Freq: Every day | ORAL | Status: DC

## 2015-04-08 NOTE — Patient Instructions (Addendum)
We will be checking the following labs today - NONE   Medication Instructions:    Continue with your current medicines. BUT  I am increasing the Imdur to 30 mg a day - try this for 2 weeks - then call and let me know how you are doing.     Testing/Procedures To Be Arranged:  N/A  Follow-Up:   See me in 3 months - please give appointment time     Other Special Instructions:   N/A    If you need a refill on your cardiac medications before your next appointment, please call your pharmacy.   Call the [place] office at [phone] if you have any questions, problems or concerns.

## 2015-04-08 NOTE — Progress Notes (Signed)
CARDIOLOGY OFFICE NOTE  Date:  04/08/2015    Hunter Mcbride Date of Birth: 04/29/1945 Medical Record #[id number]  PCP:  Jerlyn Ly, MD  Cardiologist:  Nah[place]    Chief Complaint  Patient presents with  . Coronary Artery Disease  . Hypertension  . Hyperlipidemia    Follow up visit - seen for Dr. Acie Fredrickson.     History of Present Illness: Hunter Mcbride is a 70 y.o. male who presents today for a follow up visit/post hospital visit. Seen for Dr. Acie Fredrickson. His wife is a former patient of Dr. Susa Simmonds that I have taken care of. Mr. Yadav has CAD, HTN, HLD, DM and OSA.   He presented back in the spring of 2015 with a 2 month history of exertional chest pain - had abnormal Myoview - then cathed and had DES x 3 to the LAD. Otherwise had moderate but nonobstructive disease With a normal EF of 55 to 60%. He was discharged 06/22/13. On Plavix. After his initial event - he went back to the ER and has been recathed - to continue with medical management. He has not tolerated [place] active NTG.   In November of 2015 - he went hunting with his son. He had been cutting trees and setting up stands. Developed SSCP that initially resolved but then returned, subsequently associated with SOB and nausea - ended up going to the hospital in [place] (after first presenting to an urgent care like facility that did not offer cardiac cath) - was recathed - actually twice. Had normal left heart pressures, patent stents extending from the proximal to the distal LAD and severe diffuse disease involving the branches of all 3 major epicardial vessels. No clear culprit lesion to intervene on - then had Myoview which showed normal perfusion and normal EF at 59%. Was recathed and had FFR of the first OM, posterior descending and distal RCA which were nonsignificant - no PCI was performed. CXR was normal. Echo was also updated - normal EF - mildly dilated aorta noted with mild LVH. I did not get a discharge summary - they  brought a RX in for 1000 mg of Ranexa - this was not filled.  I saw him back after his return from [place]. He reiterated his story again about what happened to him in [place]. He did probably over exert him self - had walked 6 1/2 miles in the process of putting up the stands/cutting trees, etc. Had pretty classic chest pain associated with dyspnea and feeling of "impending doom". He notes that his symptoms seemed to abate after the second cath. He did take the higher dose of Ranexa but had some side effects (low BP, couldn't think, stomach upset, dizziness) and cut the dose back - now back to his prior dose.   Since that trip - he had had some fleeting pain - he did bring his cath CDs and these were reviewed by Dr. Burt Knack. He agreed with continued medical therapy. Films were loaded in the [facility] system. Last seen in January of 2016. Due to some continued spells of angina - I rechallenged him with Imdur. Seen back in April and was doing ok but still with some rare spells of chest pain. I last saw him in August - felt to have stable angina - has tendency to "overdo".  In the hospital last month with angina - low risk Myoview noted.   Comes back today. Here with Hunter Mcbride, his wife. Notes that the  pain he had back in January seemed different - more arm pain, had chest pain radiating to back. Took multiple NTG with very short lived results. Tells me how he had an EKG, CXR and had labs and was then sent to the lobby for 4 hours due to no room. Finally "eased off" but was treated with morphine and NTG. Got bronchitis afterwards. Since then, he has had a few "twinges" and thinks he is really back to his baseline. Nothing that now lasts [place] enough to use NTG. BP ok.   Past Medical History  Diagnosis Date  . Hyperlipidemia   . Hypertension   . Gout     "maybe twice in my life"  . GERD (gastroesophageal reflux disease)   . Myocardial infarction [facility])     "/Dr. Katharina Caper I've had one between 2014-2015) (06/21/2013)   . Asthma     "not sure if this is true or not" (06/21/2013)  . OSA on CPAP   . Type II diabetes mellitus ([place])   . Headache(784.0)     "weekly for the last 3-4 months" (06/21/2013)  . Arthritis     "lower back; right shoulder; left thumb; joints" (06/21/2013)  . Osteoarthritis   . CAD (coronary artery disease) May 2015    s/p PTCA/DES x 3 to mid LAD, mod non-ob disease in Cx and RCA May 2015; s/p repeat caths - last study in November of 2015 - stents patent, - FFR - managed medically.     Past Surgical History  Procedure Laterality Date  . Cardiovascular stress test  06/10/2008    EF 68%  . Carpal tunnel release Bilateral   . Excision morton's neuroma Left   . Tonsillectomy    . Eye surgery Left     "removed film over my eye"  . Total shoulder arthroplasty  03/01/2012    Procedure: TOTAL SHOULDER ARTHROPLASTY;  Surgeon: Marin Shutter, MD;  Location: [place];  Service: Orthopedics;  Laterality: Right;  . Cardiac catheterization  1980's    "once"  . Coronary angioplasty with stent placement  06/21/2013    "3"  . Hernia repair Left   . Umbilical hernia repair    . Joint replacement Left 03/2013    "thumb"  . Left heart catheterization with coronary angiogram N/A 06/21/2013    Procedure: LEFT HEART CATHETERIZATION WITH CORONARY ANGIOGRAM;  Surgeon: Burnell Blanks, MD;  Location: [facility] CATH LAB;  Service: Cardiovascular;  Laterality: N/A;  . Left heart catheterization with coronary angiogram N/A 06/26/2013    Procedure: LEFT HEART CATHETERIZATION WITH CORONARY ANGIOGRAM;  Surgeon: [name], MD;  Location: [place] CATH LAB;  Service: Cardiovascular;  Laterality: N/A;  . Fractional flow reserve wire N/A 06/26/2013    Procedure: [place];  Surgeon: [name], MD;  Location: [place] CATH LAB;  Service: Cardiovascular;  Laterality: N/A;     Medications: Current Outpatient Prescriptions  Medication Sig Dispense Refill  . acetaminophen (TYLENOL ARTHRITIS PAIN) 650 MG  CR tablet Take 1,300 mg by mouth 2 (two) times daily as needed. For pain    . amLODipine (NORVASC) 5 MG tablet Take 1 tablet (5 mg total) by mouth daily. 90 tablet 1  . aspirin 81 MG tablet Take 81 mg by mouth at bedtime.     [name] atenolol (TENORMIN) 50 MG tablet Take 1 tablet (50 mg total) by mouth daily. 90 tablet 1  . atorvastatin (LIPITOR) 80 MG tablet Take 40 mg by mouth daily.     [name]  clopidogrel (PLAVIX) 75 MG tablet Take 1 tablet (75 mg total) by mouth daily with breakfast. 90 tablet 1  . gabapentin (NEURONTIN) 300 MG capsule Take 300 mg by mouth at bedtime.      . hydrochlorothiazide (HYDRODIURIL) 25 MG tablet Take 12.5 mg by mouth 3 (three) times a week. Monday, Wednesday and Friday    . INVOKANA 100 MG TABS tablet Take 200 mg by mouth daily.   2  . irbesartan (AVAPRO) 300 MG tablet Take 0.5 tablets (150 mg total) by mouth daily. 90 tablet 3  . isosorbide mononitrate (IMDUR) 30 MG 24 hr tablet Take 1 tablet (30 mg total) by mouth daily. 30 tablet 6  . LORazepam (ATIVAN) 0.5 MG tablet Take 0.5 mg by mouth every 6 (six) hours as needed for anxiety.     . metFORMIN (GLUCOPHAGE-XR) 500 MG 24 hr tablet Take 1,000 mg by mouth 2 (two) times daily.     . nitroGLYCERIN (NITROSTAT) 0.4 MG SL tablet Place 1 tablet (0.4 mg total) under the tongue every 5 (five) minutes as needed. For chest pain 25 tablet 5  . pantoprazole (PROTONIX) 40 MG tablet Take 40 mg by mouth daily.    . ranolazine (RANEXA) 500 MG 12 hr tablet Take 1 tablet (500 mg total) by mouth 2 (two) times daily. 180 tablet 1  . traMADol (ULTRAM) 50 MG tablet Take 50 mg by mouth every 6 (six) hours as needed for moderate pain.      No current facility-administered medications for this visit.    Allergies: No Known Allergies  Social History: The patient  reports that he has never smoked. He has never used smokeless tobacco. He reports that he drinks alcohol. He reports that he does not use illicit drugs.   Family History: The patient's  family history includes Coronary artery disease in his cousin; Diabetes type II in his mother; Heart attack in his father; Heart disease in his brother.   Review of Systems: Please see the history of present illness.   Otherwise, the review of systems is positive for none.   All other systems are reviewed and negative.   Physical Exam: VS:  BP 128/72 mmHg  Pulse 64  Ht '5\' 9"'$  (1.753 m)  Wt 195 lb 12.8 oz (88.814 kg)  BMI 28.90 kg/m2 .  BMI Body mass index is 28.9 kg/(m^2).  Wt Readings from Last 3 Encounters:  04/08/15 195 lb 12.8 oz (88.814 kg)  02/25/15 196 lb (88.905 kg)  10/10/14 190 lb 6.4 oz (86.365 kg)    General: Pleasant. Well developed, well nourished and in no acute distress.  HEENT: Normal. Neck: Supple, no JVD, carotid bruits, or masses noted.  Cardiac: Regular rate and rhythm. No murmurs, rubs, or gallops. No edema.  Respiratory:  Lungs are clear to auscultation bilaterally with normal work of breathing.  GI: Soft and nontender.  MS: No deformity or atrophy. Gait and ROM intact. Skin: Warm and dry. Color is normal.  Neuro:  Strength and sensation are intact and no gross focal deficits noted.  Psych: Alert, appropriate and with normal affect.   LABORATORY DATA:  EKG:  EKG is ordered today. This demonstrates sinus bradycardia. Inferior infarct.   Lab Results  Component Value Date   WBC 3.9* 02/24/2015   HGB 14.8 02/24/2015   HCT 43.0 02/24/2015   PLT 130* 02/24/2015   GLUCOSE 227* 02/24/2015   CHOL 123 04/11/2011   TRIG 119.0 04/11/2011   HDL 41.60 04/11/2011   LDLCALC 58  04/11/2011   ALT 28 06/27/2013   AST 17 06/27/2013   NA 139 02/24/2015   K 3.9 02/24/2015   CL 104 02/24/2015   CREATININE 0.99 02/24/2015   BUN 17 02/24/2015   CO2 24 02/24/2015   INR 1.05 06/25/2013   HGBA1C 8.4* 06/26/2013    BNP (last 3 results) No results for input(s): BNP in the last 8760 hours.  ProBNP (last 3 results) No results for input(s): PROBNP in the last 8760  hours.   Other Studies Reviewed Today:  Myoview stress test from 02/2015: -No T wave inversion was noted during stress. -There was no ST segment deviation noted during stress. -The study is normal. There is no ischemia. -This is a low risk study. -Nuclear stress EF: 62%.  Assessment/Plan:  1. CAD with history of unstable angina - his last cath dates back to November of 2015 and he had FFR and no lesions intervened on. Dr. Burt Knack has previously reviewed as well. Recent low risk Myoview. Symptoms have returned to his baseline. Has not tolerated [place] acting nitrates but he is willing to try again - will increase to 30 mg daily for the next 2 weeks. He is not wanting to increase Ranexa due to cost - but this may be our next option.   2. HTN - BP stable on his current regimen.  3. HLD - on statin   4. DM - followed by Dr. Joylene Draft. Having yearly physical in next few weeks with labs.   5. Mildly dilated aortic root/ascending aorta - not seen on our last echo from May 2015 - would follow. Will recheck echo on return.  Current medicines are reviewed with the patient today.  The patient does not have concerns regarding medicines other than what has been noted above.  The following changes have been made:  See above.  Labs/ tests ordered today include:    Orders Placed This Encounter  Procedures  . EKG 12-Lead     Disposition:   FU with me in 3 months.   Patient is agreeable to this plan and will call if any problems develop in the interim.   Signed: Burtis Junes, RN, ANP-C 04/08/2015 8:35 AM  [facility] [address] [place], [place]  [address] Phone: [phone] Fax: [phone]

## 2015-04-16 DIAGNOSIS — I10 Essential (primary) hypertension: Secondary | ICD-10-CM | POA: Diagnosis not present

## 2015-04-16 DIAGNOSIS — I2510 Atherosclerotic heart disease of native coronary artery without angina pectoris: Secondary | ICD-10-CM | POA: Diagnosis not present

## 2015-04-16 DIAGNOSIS — Z125 Encounter for screening for malignant neoplasm of prostate: Secondary | ICD-10-CM | POA: Diagnosis not present

## 2015-04-16 DIAGNOSIS — E784 Other hyperlipidemia: Secondary | ICD-10-CM | POA: Diagnosis not present

## 2015-04-16 DIAGNOSIS — Z0000 Encounter for general adult medical examination without abnormal findings: Secondary | ICD-10-CM | POA: Insufficient documentation

## 2015-04-16 DIAGNOSIS — E119 Type 2 diabetes mellitus without complications: Secondary | ICD-10-CM | POA: Diagnosis not present

## 2015-04-23 ENCOUNTER — Encounter: Payer: Self-pay | Admitting: Nurse Practitioner

## 2015-04-23 DIAGNOSIS — Z0000 Encounter for general adult medical examination without abnormal findings: Secondary | ICD-10-CM | POA: Diagnosis not present

## 2015-04-23 DIAGNOSIS — D126 Benign neoplasm of colon, unspecified: Secondary | ICD-10-CM | POA: Insufficient documentation

## 2015-04-23 DIAGNOSIS — G4733 Obstructive sleep apnea (adult) (pediatric): Secondary | ICD-10-CM | POA: Diagnosis not present

## 2015-04-23 DIAGNOSIS — E119 Type 2 diabetes mellitus without complications: Secondary | ICD-10-CM | POA: Diagnosis not present

## 2015-04-23 DIAGNOSIS — Z1389 Encounter for screening for other disorder: Secondary | ICD-10-CM | POA: Diagnosis not present

## 2015-04-23 DIAGNOSIS — J45909 Unspecified asthma, uncomplicated: Secondary | ICD-10-CM | POA: Diagnosis not present

## 2015-04-23 DIAGNOSIS — I208 Other forms of angina pectoris: Secondary | ICD-10-CM | POA: Diagnosis not present

## 2015-04-23 DIAGNOSIS — I10 Essential (primary) hypertension: Secondary | ICD-10-CM | POA: Diagnosis not present

## 2015-04-23 DIAGNOSIS — F418 Other specified anxiety disorders: Secondary | ICD-10-CM | POA: Diagnosis not present

## 2015-04-23 DIAGNOSIS — E784 Other hyperlipidemia: Secondary | ICD-10-CM | POA: Diagnosis not present

## 2015-04-23 DIAGNOSIS — H9193 Unspecified hearing loss, bilateral: Secondary | ICD-10-CM | POA: Diagnosis not present

## 2015-04-23 DIAGNOSIS — Z6828 Body mass index (BMI) 28.0-28.9, adult: Secondary | ICD-10-CM | POA: Diagnosis not present

## 2015-04-23 DIAGNOSIS — I2510 Atherosclerotic heart disease of native coronary artery without angina pectoris: Secondary | ICD-10-CM | POA: Diagnosis not present

## 2015-04-24 DIAGNOSIS — Z1212 Encounter for screening for malignant neoplasm of rectum: Secondary | ICD-10-CM | POA: Diagnosis not present

## 2015-05-04 ENCOUNTER — Ambulatory Visit
Admission: RE | Admit: 2015-05-04 | Discharge: 2015-05-04 | Disposition: A | Discharge status: Home / Self Care | Payer: PPO | Source: Ambulatory Visit | Attending: Internal Medicine | Admitting: Internal Medicine

## 2015-05-04 ENCOUNTER — Other Ambulatory Visit: Payer: Self-pay | Admitting: Internal Medicine

## 2015-05-04 DIAGNOSIS — M79672 Pain in left foot: Secondary | ICD-10-CM

## 2015-05-04 DIAGNOSIS — S99922A Unspecified injury of left foot, initial encounter: Secondary | ICD-10-CM | POA: Diagnosis not present

## 2015-05-04 DIAGNOSIS — S99912A Unspecified injury of left ankle, initial encounter: Secondary | ICD-10-CM | POA: Diagnosis not present

## 2015-05-04 DIAGNOSIS — M25572 Pain in left ankle and joints of left foot: Secondary | ICD-10-CM | POA: Diagnosis not present

## 2015-05-15 ENCOUNTER — Encounter: Payer: Self-pay | Admitting: Podiatry

## 2015-05-15 ENCOUNTER — Ambulatory Visit (INDEPENDENT_AMBULATORY_CARE_PROVIDER_SITE_OTHER): Payer: PPO | Admitting: Podiatry

## 2015-05-15 VITALS — BP 143/63 | HR 58 | Resp 18

## 2015-05-15 DIAGNOSIS — Q828 Other specified congenital malformations of skin: Secondary | ICD-10-CM

## 2015-05-15 DIAGNOSIS — S92912A Unspecified fracture of left toe(s), initial encounter for closed fracture: Secondary | ICD-10-CM

## 2015-05-15 DIAGNOSIS — M7752 Other enthesopathy of left foot: Secondary | ICD-10-CM

## 2015-05-15 DIAGNOSIS — M779 Enthesopathy, unspecified: Secondary | ICD-10-CM | POA: Diagnosis not present

## 2015-05-15 DIAGNOSIS — M25572 Pain in left ankle and joints of left foot: Secondary | ICD-10-CM

## 2015-05-15 NOTE — Progress Notes (Signed)
   Subjective:    Patient ID: Hunter Mcbride, male    DOB: 10-May-1945, 70 y.o.   MRN: [id number]  HPI  70 year old male presents the office today for concerns of a painful lesion the bottom of his left foot for which she points to sub metatarsal 5 which is been ongoing for last 2 months has been progressive. If the area is very painful to pressure in shoe gear. He said no previous treatment.  He also states he started to have pain along the lateral aspect of the ankle for which she points the sinus tarsi. He states with uneven ground this starts to hurt. Denies any recent injury or trauma. X-rays previously which were negative. He said no previous treatment.  About 2 weeks ago he did drop something on the top of his left foot. His primary care physician ordered x-rays and had a fracture the base of the big toe. This has been improving. No pain area. He got quite a bit of ecchymosis which is resolving.  States his blood sugars controlled and runs in the low 100s.  No other complaints.  Review of Systems  All other systems reviewed and are negative.      Objective:   Physical Exam General: AAO x3, NAD  Dermatological: Hyperkeratotic lesion present left foot second metatarsal 5. Upon debridement does appear to be a deep, punctate, annular hyperkeratotic lesion. This appears to be a porokeratosis. No other open lesions or pre-ulcer lesions identified. There is slight ecchymosis to the forefoot have this subjectively is improving.  Vascular: DP/PT pulses 2/4, CRT less than 3 seconds There is no pain with calf compression, swelling, warmth, erythema.   Neruologic: Grossly intact via light touch bilateral. Vibratory intact via tuning fork bilateral. Protective threshold with [employer] monofilament intact to all pedal sites bilateral. Patellar and Achilles deep tendon reflexes 2+ bilateral. No Babinski or clonus noted bilateral.   Musculoskeletal: There is tenderness palpation along the  lateral aspect of the sinus tarsi the left foot. There is mild discomfort with subtalar joint range of motion. Ankle joint range of motion is intact. There is no amount edema, erythema, increase in warmth. There is no tenderness palpation of the hallux to the other toes or the metatarsals. No other areas of tenderness bilateral lower extremities. MMT 5/5.   Assessment : 70 year old male left foot subtalar joint capsulitis, second metatarsal 5 porokeratosis, hallux fracture   Plan : -Treatment options discussed including all alternatives, risks, and complications -Etiology of symptoms were discussed -X-rays were reviewed. There is a fracture the base of the hallux. Currently without symptoms. X-rays of the ankle negative  -As far as a porokeratosis the area was debrided without complications or bleeding. Area was cleaned. A pad was placed around followed by salicylic acid. Post procedure care was discussed. Discussed possible recurrence.  -For concerns of lateral ankle pain is somewhat subtalar joint capsulitis. Discussed steroid injection for which he wishes to proceed with. Under sterile conditions a mixture of dexamethasone phosphate as well as local anesthetic was infiltrated into the area of maximal tenderness without complications. Post injection care was discussed.  -Hallux fracture currently asymptomatic. Will monitor.  -Follow-up as scheduled.   Celesta Gentile, DPM

## 2015-06-08 ENCOUNTER — Ambulatory Visit (INDEPENDENT_AMBULATORY_CARE_PROVIDER_SITE_OTHER): Payer: PPO | Admitting: Podiatry

## 2015-06-08 ENCOUNTER — Encounter: Payer: Self-pay | Admitting: Podiatry

## 2015-06-08 DIAGNOSIS — M216X2 Other acquired deformities of left foot: Secondary | ICD-10-CM | POA: Diagnosis not present

## 2015-06-08 DIAGNOSIS — Q828 Other specified congenital malformations of skin: Secondary | ICD-10-CM | POA: Diagnosis not present

## 2015-06-08 DIAGNOSIS — E1149 Type 2 diabetes mellitus with other diabetic neurological complication: Secondary | ICD-10-CM | POA: Diagnosis not present

## 2015-06-08 DIAGNOSIS — S92912A Unspecified fracture of left toe(s), initial encounter for closed fracture: Secondary | ICD-10-CM | POA: Diagnosis not present

## 2015-06-08 DIAGNOSIS — M779 Enthesopathy, unspecified: Secondary | ICD-10-CM

## 2015-06-08 NOTE — Progress Notes (Signed)
   Subjective:    Patient ID: Hunter Mcbride, male    DOB: 26-Jul-1945, 70 y.o.   MRN: [id number]  HPI "It's doing pretty good." Patient presents to the office today for follow up evaluation of left hallux fracture, subtalar joint arthritis, 7 metatarsal 5 hyperkeratotic lesion. He says that overall he is doing better. He gets some occasional discomfort along the joint with injections performed however it has improved. He did have a piece of skin removed from the sub metatarsal 5 callus since last appointment in the area is feeling much better. He states the big toe is doing better although somewhat still sore. He wishes not to get an x-ray today. No recent injury or trauma. No other complaints.   Review of Systems  All other systems reviewed and are negative.      Objective:   Physical Exam General: AAO x3, NAD  Dermatological:Left foot second metatarsal 5 very mild hyperkeratotic lesion. No Ulceration, drainage or other signs of infection. No edema, erythema.  Vascular: Dorsalis Pedis artery and Posterior Tibial artery pedal pulses are 2/4 bilateral with immedate capillary fill time. Pedal hair growth present. No varicosities and no lower extremity edema present bilateral. There is no pain with calf compression, swelling, warmth, erythema.   Neruologic: There is a mild decrease in [employer] monofilament. He is currently taking gabapentin for neuropathy as his subjective numbness and tingling.  Musculoskeletal: There is decreased range of motion of the subtalar joint on the left side worse than the right. There is mild discomfort on the sinus tarsi on the left foot. There is no edema, erythema, increase in warmth. Mild hammertoes are present. Mild discomfort on the hallux how there is no significant edema, erythema, increase in warmth. No other areas of tenderness bilaterally.  Gait: Unassisted, Nonantalgic.       Assessment & Plan:  70 year old male with foot pain somewhat  improved. -Treatment options discussed including all alternatives, risks, and complications -He wishes to hold off on repeat x-rays today. -Submetatarsal 5 hyperkeratotic lesion lightly debrided. No underlying ulceration at this time. -I do believe that he would benefit  from a more supportive shoe and an insert. I did complete paperwork today for precertification diabetic shoes. Given his neuropathy as well as the pain and deformity to his toes and the rear foot and I do think that he would benefit from diabetic shoes. -Follow-up if symptoms not resolved within 4 weeks or sooner if any issues are to arise. Call if questions or concerns in the meantime.  Celesta Gentile, DPM

## 2015-07-22 ENCOUNTER — Telehealth: Payer: Self-pay | Admitting: *Deleted

## 2015-07-22 ENCOUNTER — Other Ambulatory Visit: Payer: Self-pay | Admitting: Nurse Practitioner

## 2015-07-22 ENCOUNTER — Encounter: Payer: Self-pay | Admitting: Nurse Practitioner

## 2015-07-22 ENCOUNTER — Ambulatory Visit (INDEPENDENT_AMBULATORY_CARE_PROVIDER_SITE_OTHER): Payer: PPO | Admitting: Nurse Practitioner

## 2015-07-22 VITALS — BP 170/80 | HR 66 | Ht 69.0 in | Wt 195.0 lb

## 2015-07-22 DIAGNOSIS — Z7901 Long term (current) use of anticoagulants: Secondary | ICD-10-CM | POA: Diagnosis not present

## 2015-07-22 DIAGNOSIS — I10 Essential (primary) hypertension: Secondary | ICD-10-CM

## 2015-07-22 DIAGNOSIS — E785 Hyperlipidemia, unspecified: Secondary | ICD-10-CM | POA: Diagnosis not present

## 2015-07-22 DIAGNOSIS — I7789 Other specified disorders of arteries and arterioles: Secondary | ICD-10-CM

## 2015-07-22 DIAGNOSIS — I259 Chronic ischemic heart disease, unspecified: Secondary | ICD-10-CM | POA: Diagnosis not present

## 2015-07-22 DIAGNOSIS — I209 Angina pectoris, unspecified: Secondary | ICD-10-CM

## 2015-07-22 LAB — CBC
HCT: 41.4 % (ref 38.5–50.0)
Hemoglobin: 14.1 g/dL (ref 13.2–17.1)
MCH: 30.9 pg (ref 27.0–33.0)
MCHC: 34.1 g/dL (ref 32.0–36.0)
MCV: 90.8 fL (ref 80.0–100.0)
MPV: 9.5 fL (ref 7.5–12.5)
Platelets: 134 10*3/uL — ABNORMAL LOW (ref 140–400)
RBC: 4.56 MIL/uL (ref 4.20–5.80)
RDW: 13.4 % (ref 11.0–15.0)
WBC: 3.7 10*3/uL — ABNORMAL LOW (ref 3.8–10.8)

## 2015-07-22 LAB — BASIC METABOLIC PANEL
BUN: 17 mg/dL (ref 7–25)
CO2: 23 mmol/L (ref 20–31)
Calcium: 9.1 mg/dL (ref 8.6–10.3)
Chloride: 104 mmol/L (ref 98–110)
Creat: 0.92 mg/dL (ref 0.70–1.25)
Glucose, Bld: 275 mg/dL — ABNORMAL HIGH (ref 65–99)
Potassium: 4 mmol/L (ref 3.5–5.3)
Sodium: 140 mmol/L (ref 135–146)

## 2015-07-22 LAB — PROTIME-INR
INR: 1.04 (ref <1.50)
Prothrombin Time: 13.7 seconds (ref 11.6–15.2)

## 2015-07-22 LAB — APTT: aPTT: 33 seconds (ref 24–37)

## 2015-07-22 MED ORDER — AMLODIPINE BESYLATE 5 MG PO TABS: 5.0000 mg | ORAL_TABLET | Freq: Two times a day (BID) | ORAL | Status: DC

## 2015-07-22 NOTE — Telephone Encounter (Signed)
Hunter Mcbride from [place] called with stat labs. She will fax to [phone] Only abnormals she reported are WBC 3.7, Plt 134, gluc 275

## 2015-07-22 NOTE — Progress Notes (Signed)
CARDIOLOGY OFFICE NOTE  Date:  07/22/2015    Hunter Mcbride Date of Birth: Dec 12, 1945 Medical Record #[id number]  PCP:  Jerlyn Ly, MD  Cardiologist:  Nah[place]    Chief Complaint  Patient presents with  . Coronary Artery Disease  . Chest Pain  . Hyperlipidemia  . Hypertension    3 month check - seen for Dr. Acie Fredrickson    History of Present Illness: Hunter Mcbride is a 70 y.o. male who presents today for a 3 month check. Seen for Dr. Acie Fredrickson. His wife is a former patient of Dr. Susa Simmonds that I have taken care of.   He has a history of CAD, HTN, HLD, DM and OSA.   He presented back in the spring of 2015 with a 2 month history of exertional chest pain - had abnormal Myoview - then cathed and had DES x 3 to the LAD. Otherwise had moderate but nonobstructive disease With a normal EF of 55 to 60%. Dscharged on Plavix. After his initial event - he went back to the ER with chest pain and was recathed - to continue with medical management. He has not tolerated Bartholomew active NTG in the past despite several attempts.   In November of 2015 - he went hunting with his son out of state. He had been cutting trees and setting up stands. Developed SSCP that initially resolved but then returned, subsequently associated with SOB and nausea - ended up going to the hospital in [place] (after first presenting to an urgent care like facility that did not offer cardiac cath) - was recathed - actually twice. Had normal left heart pressures, patent stents extending from the proximal to the distal LAD and severe diffuse disease involving the branches of all 3 major epicardial vessels. No clear culprit lesion to intervene on - then had Myoview which showed normal perfusion and normal EF at 59%. Was recathed and had FFR of the first OM, posterior descending and distal RCA which were nonsignificant - no PCI was performed. CXR was normal. Echo was also updated - normal EF - mildly dilated aorta noted with mild LVH. I  did not get a discharge summary - they brought a RX in for 1000 mg of Ranexa - this was not filled.  I saw him back after his return from [place]. He reiterated his story again about what happened to him in [place]. He did probably over exert him self - had walked 6 1/2 miles in the process of putting up the stands/cutting trees, etc. Had pretty classic chest pain associated with dyspnea and feeling of "impending doom". He notes that his symptoms seemed to abate after the second cath. He did take the higher dose of Ranexa but had some side effects (low BP, couldn't think, stomach upset, dizziness) and cut the dose back - he has remained on this dose.   Since that trip - he had had some fleeting pain - he did bring his cath CDs and these were reviewed by Dr. Burt Knack. He agreed with continued medical therapy. Films were loaded in the [facility] system.   In the hospital back in January of 2017 with angina - low risk Myoview noted. Continued on medical management. Last seen back in February and was felt to be at his baseline. He will have some episodes of chest pain. Rechallenged him with Imdur. Has not wanted to increase Ranexa due to cost.  Comes back today. Here with Hunter Mcbride, his wife. He has  not done well over this past 3 weeks. More chest pain/back pain with radiation to his left arm/shoulder. His discomfort is always AFTER he has exerted. His last [place] that required 3 NTG - by the 2nd he did have easing of symptoms. Wife gave him 2 Xanax and they got back in the car and drove back home with intentions of going to the [facility] - he was ok by the time they got back to [place]. He has not had spells since. He is using NTG more frequently over this past 3 weeks and feels an overall increase in his symptoms which are identical to his chest pain syndrome. BP typically ok at home but elevated here today. He wants to go back to [place] in September.    Past  Medical History  Diagnosis Date  . Hyperlipidemia   . Hypertension   . Gout     "maybe twice in my life"  . GERD (gastroesophageal reflux disease)   . Myocardial infarction [facility])     "/Dr. Katharina Caper I've had one between 2014-2015) (06/21/2013)  . Asthma     "not sure if this is true or not" (06/21/2013)  . OSA on CPAP   . Type II diabetes mellitus ([place])   . Headache(784.0)     "weekly for the last 3-4 months" (06/21/2013)  . Arthritis     "lower back; right shoulder; left thumb; joints" (06/21/2013)  . Osteoarthritis   . CAD (coronary artery disease) May 2015    s/p PTCA/DES x 3 to mid LAD, mod non-ob disease in Cx and RCA May 2015; s/p repeat caths - last study in November of 2015 - stents patent, - FFR - managed medically.     Past Surgical History  Procedure Laterality Date  . Cardiovascular stress test  06/10/2008    EF 68%  . Carpal tunnel release Bilateral   . Excision morton's neuroma Left   . Tonsillectomy    . Eye surgery Left     "removed film over my eye"  . Total shoulder arthroplasty  03/01/2012    Procedure: TOTAL SHOULDER ARTHROPLASTY;  Surgeon: Marin Shutter, MD;  Location: [facility];  Service: Orthopedics;  Laterality: Right;  . Cardiac catheterization  1980's    "once"  . Coronary angioplasty with stent placement  06/21/2013    "3"  . Hernia repair Left   . Umbilical hernia repair    . Joint replacement Left 03/2013    "thumb"  . Left heart catheterization with coronary angiogram N/A 06/21/2013    Procedure: LEFT HEART CATHETERIZATION WITH CORONARY ANGIOGRAM;  Surgeon: Burnell Blanks, MD;  Location: [facility] CATH LAB;  Service: Cardiovascular;  Laterality: N/A;  . Left heart catheterization with coronary angiogram N/A 06/26/2013    Procedure: LEFT HEART CATHETERIZATION WITH CORONARY ANGIOGRAM;  Surgeon: [name], MD;  Location: [facility] CATH LAB;  Service: Cardiovascular;  Laterality: N/A;  . Fractional flow reserve wire N/A 06/26/2013    Procedure: [place];  Surgeon: [name], MD;  Location: [facility] CATH LAB;  Service: Cardiovascular;  Laterality: N/A;     Medications: Current Outpatient Prescriptions  Medication Sig Dispense Refill  . acetaminophen (TYLENOL ARTHRITIS PAIN) 650 MG CR tablet Take 1,300 mg by mouth 2 (two) times daily as needed. For pain    . amLODipine (NORVASC) 5 MG tablet Take 1 tablet (5 mg total) by mouth 2 (  two) times daily. 180 tablet 3  . aspirin 81 MG tablet Take 81 mg by mouth at bedtime.     [name] atenolol (TENORMIN) 50 MG tablet Take 1 tablet (50 mg total) by mouth daily. 90 tablet 1  . atorvastatin (LIPITOR) 80 MG tablet Take 40 mg by mouth daily.     . clopidogrel (PLAVIX) 75 MG tablet Take 1 tablet (75 mg total) by mouth daily with breakfast. 90 tablet 1  . gabapentin (NEURONTIN) 300 MG capsule Take 300 mg by mouth at bedtime.      . hydrochlorothiazide (HYDRODIURIL) 25 MG tablet Take 12.5 mg by mouth 3 (three) times a week. Monday, Wednesday and Friday    . INVOKANA 100 MG TABS tablet Take 200 mg by mouth daily.   2  . irbesartan (AVAPRO) 300 MG tablet Take 0.5 tablets (150 mg total) by mouth daily. 90 tablet 3  . isosorbide mononitrate (IMDUR) 30 MG 24 hr tablet Take 1 tablet (30 mg total) by mouth daily. 30 tablet 6  . LORazepam (ATIVAN) 0.5 MG tablet Take 0.5 mg by mouth every 6 (six) hours as needed for anxiety.     . metFORMIN (GLUCOPHAGE-XR) 500 MG 24 hr tablet Take 1,000 mg by mouth 2 (two) times daily.     . nitroGLYCERIN (NITROSTAT) 0.4 MG SL tablet Place 1 tablet (0.4 mg total) under the tongue every 5 (five) minutes as needed. For chest pain 25 tablet 5  . pantoprazole (PROTONIX) 40 MG tablet Take 40 mg by mouth daily.    . ranolazine (RANEXA) 500 MG 12 hr tablet Take 1 tablet (500 mg total) by mouth 2 (two) times daily. 180 tablet 1  . traMADol (ULTRAM) 50 MG tablet Take 50 mg by mouth every 6 (six) hours as needed for moderate pain.      No current facility-administered medications for  this visit.    Allergies: No Known Allergies  Social History: The patient  reports that he has never smoked. He has never used smokeless tobacco. He reports that he drinks alcohol. He reports that he does not use illicit drugs.   Family History: The patient's family history includes Coronary artery disease in his cousin; Diabetes type II in his mother; Heart attack in his father; Heart disease in his brother.   Review of Systems: Please see the history of present illness.   Otherwise, the review of systems is positive for none.   All other systems are reviewed and negative.   Physical Exam: VS:  BP 170/80 mmHg  Pulse 66  Ht '5\' 9"'$  (1.753 m)  Wt 195 lb (88.451 kg)  BMI 28.78 kg/m2 .  BMI Body mass index is 28.78 kg/(m^2).  Wt Readings from Last 3 Encounters:  07/22/15 195 lb (88.451 kg)  04/08/15 195 lb 12.8 oz (88.814 kg)  02/25/15 196 lb (88.905 kg)    General: Pleasant. Well developed, well nourished and in no acute distress. Very tanned. HEENT: Normal. Neck: Supple, no JVD, carotid bruits, or masses noted.  Cardiac: Regular rate and rhythm. No murmurs, rubs, or gallops. No edema.  Respiratory:  Lungs are clear to auscultation bilaterally with normal work of breathing.  GI: Soft and nontender.  MS: No deformity or atrophy. Gait and ROM intact. Skin: Warm and dry. Color is normal.  Neuro:  Strength and sensation are intact and no gross focal deficits noted.  Psych: Alert, appropriate and with normal affect.   LABORATORY DATA:  EKG:  EKG is ordered today. This shows  NSR, inferior Q's and nonspecific changes - unchanged  Lab Results  Component Value Date   WBC 3.9* 02/24/2015   HGB 14.8 02/24/2015   HCT 43.0 02/24/2015   PLT 130* 02/24/2015   GLUCOSE 227* 02/24/2015   CHOL 123 04/11/2011   TRIG 119.0 04/11/2011   HDL 41.60 04/11/2011   LDLCALC 58 04/11/2011   ALT 28 06/27/2013   AST 17 06/27/2013   NA 139 02/24/2015   K 3.9 02/24/2015   CL 104 02/24/2015    CREATININE 0.99 02/24/2015   BUN 17 02/24/2015   CO2 24 02/24/2015   INR 1.05 06/25/2013   HGBA1C 8.4* 06/26/2013    BNP (last 3 results) No results for input(s): BNP in the last 8760 hours.  ProBNP (last 3 results) No results for input(s): PROBNP in the last 8760 hours.   Other Studies Reviewed Today:   Myoview stress test from 02/2015: -No T wave inversion was noted during stress. -There was no ST segment deviation noted during stress. -The study is normal. There is no ischemia. -This is a low risk study. -Nuclear stress EF: 62%.  Assessment/Plan:  1. CAD with history of unstable angina - his last cath dates back to November of 2015 and he had FFR and no lesions intervened on. Dr. Burt Knack has previously reviewed as well. Recent low risk Myoview from January of 2017. Symptoms now escalating despite medical therapy - cardiac catheterization felt to be best - The patient understands that risks include but are not limited to stroke (1 in 1000), death (1 in 1000), kidney failure [usually temporary] (1 in 500), bleeding (1 in 200), allergic reaction [possibly serious] (1 in 200), and agrees to proceed. Arranged for tomorrow am with Dr. Burt Knack who has reviewed prior films. Holding Metformin until Saturday. Hold Invokana tomorrow AM. STAT lab today.   2. HTN - BP is up - will increase Norvasc to BID  3. HLD - on statin - labs followed by PCP.  4. DM - followed by Dr. Joylene Draft.  5. Mildly dilated aortic root/ascending aorta - not seen on our last echo from May 2015 - would follow. Will see what cath shows and consider echo on return if needed.   Current medicines are reviewed with the patient today.  The patient does not have concerns regarding medicines other than what has been noted above.  The following changes have been made:  See above.  Labs/ tests ordered today include:    Orders Placed This Encounter  Procedures  . Basic metabolic panel  . CBC  . Protime-INR  . APTT  .  EKG 12-Lead  . ECHOCARDIOGRAM COMPLETE     Disposition:   FU with me in 6 months.   Patient is agreeable to this plan and will call if any problems develop in the interim.   Signed: Burtis Junes, RN, ANP-C 07/22/2015 9:05 AM  [facility] [address] [place], [place]  [address] Phone: [phone] Fax: [phone]

## 2015-07-22 NOTE — Patient Instructions (Addendum)
We will be checking the following labs today - STAT BMET, CBC, PT, PTT  Medication Instructions:    Continue with your current medicines. BUT  I am increasing the Norvasc to twice a day - this is for your chest pain and your BP    Testing/Procedures To Be Arranged:  Cardiac Catheterization  Follow-Up:   See me in 2 to 3 weeks for post cath visit.     Other Special Instructions:  Your provider has recommended a cardiac catherization  You are scheduled for a cardiac catheterization for tomorrow at 7:30 AM with Dr. Burt Knack or associate.  Go to [facility] on tomorrow at 5:30 AM  Enter thru the [facility] entrance A No food or drink after midnight tonight. You may take your medications with a sip of water on the day of your procedure.   No more Metformin today, tomorrow, or Friday - restart on Saturday No Invokana tomorrow morning only  Coronary Angiogram A coronary angiogram, also called coronary angiography, is an X-ray procedure used to look at the arteries in the heart. In this procedure, a dye (contrast dye) is injected through a Dada, hollow tube (catheter). The catheter is about the size of a piece of cooked spaghetti and is inserted through your groin, wrist, or arm. The dye is injected into each artery, and X-rays are then taken to show if there is a blockage in the arteries of your heart.  LET [facility] CARE PROVIDER KNOW ABOUT: Any allergies you have, including allergies to shellfish or contrast dye.  All medicines you are taking, including vitamins, herbs, eye drops, creams, and over-the-counter medicines.  Previous problems you or members of your family have had with the use of anesthetics.  Any blood disorders you have.  Previous surgeries you have had. History of kidney problems or failure.  Other medical conditions you have.  RISKS AND COMPLICATIONS  Generally, a coronary angiogram is a safe procedure. However, about 1 person out  of 1000 can have problems that may include: Allergic reaction to the dye. Bleeding/bruising from the access site or other locations. Kidney injury, especially in people with impaired kidney function. Stroke (rare). Heart attack (rare). Irregular rhythms (rare) Death (rare)  BEFORE THE PROCEDURE  Do not eat or drink anything after midnight the night before the procedure or as directed by your health care provider.  Ask your health care provider about changing or stopping your regular medicines. This is especially important if you are taking diabetes medicines or blood thinners.  PROCEDURE You may be given a medicine to help you relax (sedative) before the procedure. This medicine is given through an intravenous (IV) access tube that is inserted into one of your veins.  The area where the catheter will be inserted will be washed and shaved. This is usually done in the groin but may be done in the fold of your arm (near your elbow) or in the wrist.  A medicine will be given to numb the area where the catheter will be inserted (local anesthetic).  The health care provider will insert the catheter into an artery. The catheter will be guided by using a special type of X-ray (fluoroscopy) of the blood vessel being examined.  A special dye will then be injected into the catheter, and X-rays will be taken. The dye will help to show where any narrowing or blockages are located in the heart arteries.    AFTER THE PROCEDURE  If the procedure  is done through the leg, you will be kept in bed lying flat for several hours. You will be instructed to not bend or cross your legs. The insertion site will be checked frequently.  The pulse in your feet or wrist will be checked frequently.  Additional blood tests, X-rays, and an electrocardiogram may be done.       If you need a refill on your cardiac medications before your next appointment, please call your pharmacy.   Call the [place] office at [phone] if you have any questions, problems or concerns.

## 2015-07-23 ENCOUNTER — Encounter (HOSPITAL_COMMUNITY)
Admission: RE | Disposition: A | Discharge status: Home / Self Care | Payer: Self-pay | Source: Ambulatory Visit | Attending: Cardiovascular Disease

## 2015-07-23 ENCOUNTER — Encounter (HOSPITAL_COMMUNITY): Payer: Self-pay | Admitting: Cardiovascular Disease

## 2015-07-23 ENCOUNTER — Ambulatory Visit (HOSPITAL_COMMUNITY)
Admission: RE | Admit: 2015-07-23 | Discharge: 2015-07-23 | Disposition: A | Discharge status: Home / Self Care | Payer: PPO | Source: Ambulatory Visit | Attending: Cardiovascular Disease | Admitting: Cardiovascular Disease

## 2015-07-23 DIAGNOSIS — K219 Gastro-esophageal reflux disease without esophagitis: Secondary | ICD-10-CM | POA: Diagnosis not present

## 2015-07-23 DIAGNOSIS — E119 Type 2 diabetes mellitus without complications: Secondary | ICD-10-CM | POA: Insufficient documentation

## 2015-07-23 DIAGNOSIS — I10 Essential (primary) hypertension: Secondary | ICD-10-CM | POA: Insufficient documentation

## 2015-07-23 DIAGNOSIS — M1990 Unspecified osteoarthritis, unspecified site: Secondary | ICD-10-CM | POA: Insufficient documentation

## 2015-07-23 DIAGNOSIS — I252 Old myocardial infarction: Secondary | ICD-10-CM | POA: Insufficient documentation

## 2015-07-23 DIAGNOSIS — Z7982 Long term (current) use of aspirin: Secondary | ICD-10-CM | POA: Insufficient documentation

## 2015-07-23 DIAGNOSIS — I2089 Other forms of angina pectoris: Secondary | ICD-10-CM | POA: Diagnosis present

## 2015-07-23 DIAGNOSIS — G4733 Obstructive sleep apnea (adult) (pediatric): Secondary | ICD-10-CM | POA: Diagnosis not present

## 2015-07-23 DIAGNOSIS — M109 Gout, unspecified: Secondary | ICD-10-CM | POA: Diagnosis not present

## 2015-07-23 DIAGNOSIS — I208 Other forms of angina pectoris: Secondary | ICD-10-CM | POA: Diagnosis present

## 2015-07-23 DIAGNOSIS — Z955 Presence of coronary angioplasty implant and graft: Secondary | ICD-10-CM | POA: Diagnosis not present

## 2015-07-23 DIAGNOSIS — Z7984 Long term (current) use of oral hypoglycemic drugs: Secondary | ICD-10-CM | POA: Diagnosis not present

## 2015-07-23 DIAGNOSIS — I25110 Atherosclerotic heart disease of native coronary artery with unstable angina pectoris: Secondary | ICD-10-CM | POA: Insufficient documentation

## 2015-07-23 DIAGNOSIS — E785 Hyperlipidemia, unspecified: Secondary | ICD-10-CM | POA: Insufficient documentation

## 2015-07-23 DIAGNOSIS — Z8249 Family history of ischemic heart disease and other diseases of the circulatory system: Secondary | ICD-10-CM | POA: Diagnosis not present

## 2015-07-23 DIAGNOSIS — Z7902 Long term (current) use of antithrombotics/antiplatelets: Secondary | ICD-10-CM | POA: Diagnosis not present

## 2015-07-23 DIAGNOSIS — I2584 Coronary atherosclerosis due to calcified coronary lesion: Secondary | ICD-10-CM | POA: Insufficient documentation

## 2015-07-23 DIAGNOSIS — I25118 Atherosclerotic heart disease of native coronary artery with other forms of angina pectoris: Secondary | ICD-10-CM | POA: Diagnosis not present

## 2015-07-23 HISTORY — PX: CARDIAC CATHETERIZATION: SHX172

## 2015-07-23 LAB — GLUCOSE, CAPILLARY: Glucose-Capillary: 154 mg/dL — ABNORMAL HIGH (ref 65–99)

## 2015-07-23 SURGERY — LEFT HEART CATH AND CORONARY ANGIOGRAPHY

## 2015-07-23 MED ORDER — SODIUM CHLORIDE 0.9% FLUSH: 3.0000 mL | Freq: Two times a day (BID) | INTRAVENOUS | Status: DC

## 2015-07-23 MED ORDER — MIDAZOLAM HCL 2 MG/2ML IJ SOLN
INTRAMUSCULAR | Status: AC
  Filled 2015-07-23: qty 2

## 2015-07-23 MED ORDER — DIAZEPAM 5 MG PO TABS
10.0000 mg | ORAL_TABLET | ORAL | Status: AC
  Administered 2015-07-23: 10 mg via ORAL

## 2015-07-23 MED ORDER — VERAPAMIL HCL 2.5 MG/ML IV SOLN
INTRAVENOUS | Status: AC
  Filled 2015-07-23: qty 2

## 2015-07-23 MED ORDER — LIDOCAINE HCL (PF) 1 % IJ SOLN
INTRAMUSCULAR | Status: DC | PRN
  Administered 2015-07-23: 3 mL

## 2015-07-23 MED ORDER — IOPAMIDOL (ISOVUE-370) INJECTION 76%
INTRAVENOUS | Status: DC | PRN
Start: 2015-07-23 — End: 2015-07-23
  Administered 2015-07-23: 65 mL via INTRAVENOUS

## 2015-07-23 MED ORDER — FENTANYL CITRATE (PF) 100 MCG/2ML IJ SOLN
INTRAMUSCULAR | Status: DC | PRN
  Administered 2015-07-23: 25 ug via INTRAVENOUS

## 2015-07-23 MED ORDER — SODIUM CHLORIDE 0.9 % IV SOLN: 250.0000 mL | INTRAVENOUS | Status: DC | PRN

## 2015-07-23 MED ORDER — ASPIRIN 81 MG PO CHEW
81.0000 mg | CHEWABLE_TABLET | ORAL | Status: AC
  Administered 2015-07-23: 81 mg via ORAL

## 2015-07-23 MED ORDER — MIDAZOLAM HCL 2 MG/2ML IJ SOLN
INTRAMUSCULAR | Status: DC | PRN
  Administered 2015-07-23: 2 mg via INTRAVENOUS

## 2015-07-23 MED ORDER — ASPIRIN 81 MG PO CHEW
CHEWABLE_TABLET | ORAL | Status: AC
  Administered 2015-07-23: 81 mg via ORAL
  Filled 2015-07-23: qty 1

## 2015-07-23 MED ORDER — FENTANYL CITRATE (PF) 100 MCG/2ML IJ SOLN
INTRAMUSCULAR | Status: AC
  Filled 2015-07-23: qty 2

## 2015-07-23 MED ORDER — HEPARIN SODIUM (PORCINE) 1000 UNIT/ML IJ SOLN
INTRAMUSCULAR | Status: DC | PRN
  Administered 2015-07-23: 5000 [IU] via INTRAVENOUS

## 2015-07-23 MED ORDER — ACETAMINOPHEN 325 MG PO TABS: 650.0000 mg | ORAL_TABLET | ORAL | Status: DC | PRN

## 2015-07-23 MED ORDER — SODIUM CHLORIDE 0.9% FLUSH: 3.0000 mL | INTRAVENOUS | Status: DC | PRN

## 2015-07-23 MED ORDER — LIDOCAINE HCL (PF) 1 % IJ SOLN
INTRAMUSCULAR | Status: AC
  Filled 2015-07-23: qty 30

## 2015-07-23 MED ORDER — DIAZEPAM 5 MG PO TABS
ORAL_TABLET | ORAL | Status: AC
  Administered 2015-07-23: 10 mg via ORAL
  Filled 2015-07-23: qty 2

## 2015-07-23 MED ORDER — HEPARIN (PORCINE) IN NACL 2-0.9 UNIT/ML-% IJ SOLN
INTRAMUSCULAR | Status: AC
  Filled 2015-07-23: qty 1000

## 2015-07-23 MED ORDER — HEPARIN SODIUM (PORCINE) 1000 UNIT/ML IJ SOLN
INTRAMUSCULAR | Status: AC
  Filled 2015-07-23: qty 1

## 2015-07-23 MED ORDER — VERAPAMIL HCL 2.5 MG/ML IV SOLN
INTRAVENOUS | Status: DC | PRN
  Administered 2015-07-23: 08:00:00 via INTRA_ARTERIAL

## 2015-07-23 MED ORDER — HEPARIN (PORCINE) IN NACL 2-0.9 UNIT/ML-% IJ SOLN
INTRAMUSCULAR | Status: DC | PRN
  Administered 2015-07-23: 1500 mL

## 2015-07-23 MED ORDER — SODIUM CHLORIDE 0.9 % WEIGHT BASED INFUSION: 3.0000 mL/kg/h | INTRAVENOUS | Status: DC

## 2015-07-23 MED ORDER — SODIUM CHLORIDE 0.9 % IV SOLN
INTRAVENOUS | Status: DC
  Administered 2015-07-23: 07:00:00 via INTRAVENOUS

## 2015-07-23 MED ORDER — ONDANSETRON HCL 4 MG/2ML IJ SOLN: 4.0000 mg | Freq: Four times a day (QID) | INTRAMUSCULAR | Status: DC | PRN

## 2015-07-23 SURGICAL SUPPLY — 11 items
CATH INFINITI 5 FR JL3.5 (CATHETERS) ×1 IMPLANT
CATH INFINITI 5FR ANG PIGTAIL (CATHETERS) ×1 IMPLANT
CATH INFINITI JR4 5F (CATHETERS) ×1 IMPLANT
DEVICE RAD COMP TR BAND LRG (VASCULAR PRODUCTS) ×1 IMPLANT
GLIDESHEATH SLEND SS 6F .021 (SHEATH) ×1 IMPLANT
KIT HEART LEFT (KITS) ×2 IMPLANT
PACK CARDIAC CATHETERIZATION (CUSTOM PROCEDURE TRAY) ×2 IMPLANT
SYR MEDRAD MARK V 150ML (SYRINGE) ×2 IMPLANT
TRANSDUCER W/STOPCOCK (MISCELLANEOUS) ×2 IMPLANT
TUBING CIL FLEX 10 FLL-RA (TUBING) ×2 IMPLANT
WIRE HI TORQ VERSACORE-J 145CM (WIRE) ×1 IMPLANT

## 2015-07-23 NOTE — H&P (View-Only) (Signed)
CARDIOLOGY OFFICE NOTE  Date:  07/22/2015    Hunter Mcbride Date of Birth: May 09, 1945 Medical Record #[id number]  PCP:  Hunter Ly, MD  Cardiologist:  Hunter Mcbride    Chief Complaint  Patient presents with  . Coronary Artery Disease  . Chest Pain  . Hyperlipidemia  . Hypertension    3 month check - seen for Hunter Mcbride    History of Present Illness: Hunter Mcbride is a 70 y.o. male who presents today for a 3 month check. Seen for Hunter Mcbride. His wife is a former patient of Hunter Mcbride that I have taken care of.   He has a history of CAD, HTN, HLD, DM and OSA.   He presented back in the spring of 2015 with a 2 month history of exertional chest pain - had abnormal Myoview - then cathed and had DES x 3 to the LAD. Otherwise had moderate but nonobstructive disease With a normal EF of 55 to 60%. Dscharged on Plavix. After his initial event - he went back to the ER with chest pain and was recathed - to continue with medical management. He has not tolerated [place] active NTG in the past despite several attempts.   In November of 2015 - he went hunting with his son out of state. He had been cutting trees and setting up stands. Developed SSCP that initially resolved but then returned, subsequently associated with SOB and nausea - ended up going to the hospital in [place] (after first presenting to an urgent care like facility that did not offer cardiac cath) - was recathed - actually twice. Had normal left heart pressures, patent stents extending from the proximal to the distal LAD and severe diffuse disease involving the branches of all 3 major epicardial vessels. No clear culprit lesion to intervene on - then had Myoview which showed normal perfusion and normal EF at 59%. Was recathed and had FFR of the first OM, posterior descending and distal RCA which were nonsignificant - no PCI was performed. CXR was normal. Echo was also updated - normal EF - mildly dilated aorta noted with mild LVH. I  did not get a discharge summary - they brought a RX in for 1000 mg of Ranexa - this was not filled.  I saw him back after his return from [place]. He reiterated his story again about what happened to him in [place]. He did probably over exert him self - had walked 6 1/2 miles in the process of putting up the stands/cutting trees, etc. Had pretty classic chest pain associated with dyspnea and feeling of "impending doom". He notes that his symptoms seemed to abate after the second cath. He did take the higher dose of Ranexa but had some side effects (low BP, couldn't think, stomach upset, dizziness) and cut the dose back - he has remained on this dose.   Since that trip - he had had some fleeting pain - he did bring his cath CDs and these were reviewed by Hunter Mcbride. He agreed with continued medical therapy. Films were loaded in the [facility] system.   In the hospital back in January of 2017 with angina - low risk Myoview noted. Continued on medical management. Last seen back in February and was felt to be at his baseline. He will have some episodes of chest pain. Rechallenged him with Imdur. Has not wanted to increase Ranexa due to cost.  Comes back today. Here with Hunter Mcbride, his wife. He has  not done well over this past 3 weeks. More chest pain/back pain with radiation to his left arm/shoulder. His discomfort is always AFTER he has exerted. His last [place] that required 3 NTG - by the 2nd he did have easing of symptoms. Wife gave him 2 Xanax and they got back in the car and drove back home with intentions of going to the [facility] ER - he was ok by the time they got back to [place]. He has not had spells since. He is using NTG more frequently over this past 3 weeks and feels an overall increase in his symptoms which are identical to his chest pain syndrome. BP typically ok at home but elevated here today. He wants to go back to [place] in September.    Past  Medical History  Diagnosis Date  . Hyperlipidemia   . Hypertension   . Gout     "maybe twice in my life"  . GERD (gastroesophageal reflux disease)   . Myocardial infarction [facility])     "/Dr. Katharina Mcbride I've had one between 2014-2015) (06/21/2013)  . Asthma     "not sure if this is true or not" (06/21/2013)  . OSA on CPAP   . Type II diabetes mellitus ([place])   . Headache(784.0)     "weekly for the last 3-4 months" (06/21/2013)  . Arthritis     "lower back; right shoulder; left thumb; joints" (06/21/2013)  . Osteoarthritis   . CAD (coronary artery disease) May 2015    s/p PTCA/DES x 3 to mid LAD, mod non-ob disease in Cx and RCA May 2015; s/p repeat caths - last study in November of 2015 - stents patent, - FFR - managed medically.     Past Surgical History  Procedure Laterality Date  . Cardiovascular stress test  06/10/2008    EF 68%  . Carpal tunnel release Bilateral   . Excision morton's neuroma Left   . Tonsillectomy    . Eye surgery Left     "removed film over my eye"  . Total shoulder arthroplasty  03/01/2012    Procedure: TOTAL SHOULDER ARTHROPLASTY;  Surgeon: Hunter Shutter, MD;  Location: [facility];  Service: Orthopedics;  Laterality: Right;  . Cardiac catheterization  1980's    "once"  . Coronary angioplasty with stent placement  06/21/2013    "3"  . Hernia repair Left   . Umbilical hernia repair    . Joint replacement Left 03/2013    "thumb"  . Left heart catheterization with coronary angiogram N/A 06/21/2013    Procedure: LEFT HEART CATHETERIZATION WITH CORONARY ANGIOGRAM;  Surgeon: Hunter Blanks, MD;  Location: [facility] CATH LAB;  Service: Cardiovascular;  Laterality: N/A;  . Left heart catheterization with coronary angiogram N/A 06/26/2013    Procedure: LEFT HEART CATHETERIZATION WITH CORONARY ANGIOGRAM;  Surgeon: Hunter Hampshire, MD;  Location: [place] [facility] CATH LAB;  Service: Cardiovascular;  Laterality: N/A;  . Fractional flow reserve wire N/A 06/26/2013    Procedure: [place];  Surgeon: Hunter Hampshire, MD;  Location: [place] CATH LAB;  Service: Cardiovascular;  Laterality: N/A;     Medications: Current Outpatient Prescriptions  Medication Sig Dispense Refill  . acetaminophen (TYLENOL ARTHRITIS PAIN) 650 MG CR tablet Take 1,300 mg by mouth 2 (two) times daily as needed. For pain    . amLODipine (NORVASC) 5 MG tablet Take 1 tablet (5 mg total) by mouth 2 (  two) times daily. 180 tablet 3  . aspirin 81 MG tablet Take 81 mg by mouth at bedtime.     [name] atenolol (TENORMIN) 50 MG tablet Take 1 tablet (50 mg total) by mouth daily. 90 tablet 1  . atorvastatin (LIPITOR) 80 MG tablet Take 40 mg by mouth daily.     . clopidogrel (PLAVIX) 75 MG tablet Take 1 tablet (75 mg total) by mouth daily with breakfast. 90 tablet 1  . gabapentin (NEURONTIN) 300 MG capsule Take 300 mg by mouth at bedtime.      . hydrochlorothiazide (HYDRODIURIL) 25 MG tablet Take 12.5 mg by mouth 3 (three) times a week. Monday, Wednesday and Friday    . INVOKANA 100 MG TABS tablet Take 200 mg by mouth daily.   2  . irbesartan (AVAPRO) 300 MG tablet Take 0.5 tablets (150 mg total) by mouth daily. 90 tablet 3  . isosorbide mononitrate (IMDUR) 30 MG 24 hr tablet Take 1 tablet (30 mg total) by mouth daily. 30 tablet 6  . LORazepam (ATIVAN) 0.5 MG tablet Take 0.5 mg by mouth every 6 (six) hours as needed for anxiety.     . metFORMIN (GLUCOPHAGE-XR) 500 MG 24 hr tablet Take 1,000 mg by mouth 2 (two) times daily.     . nitroGLYCERIN (NITROSTAT) 0.4 MG SL tablet Place 1 tablet (0.4 mg total) under the tongue every 5 (five) minutes as needed. For chest pain 25 tablet 5  . pantoprazole (PROTONIX) 40 MG tablet Take 40 mg by mouth daily.    . ranolazine (RANEXA) 500 MG 12 hr tablet Take 1 tablet (500 mg total) by mouth 2 (two) times daily. 180 tablet 1  . traMADol (ULTRAM) 50 MG tablet Take 50 mg by mouth every 6 (six) hours as needed for moderate pain.      No current facility-administered medications for  this visit.    Allergies: No Known Allergies  Social History: The patient  reports that he has never smoked. He has never used smokeless tobacco. He reports that he drinks alcohol. He reports that he does not use illicit drugs.   Family History: The patient's family history includes Coronary artery disease in his cousin; Diabetes type II in his mother; Heart attack in his father; Heart disease in his brother.   Review of Systems: Please see the history of present illness.   Otherwise, the review of systems is positive for none.   All other systems are reviewed and negative.   Physical Exam: VS:  BP 170/80 mmHg  Pulse 66  Ht '5\' 9"'$  (1.753 m)  Wt 195 lb (88.451 kg)  BMI 28.78 kg/m2 .  BMI Body mass index is 28.78 kg/(m^2).  Wt Readings from Last 3 Encounters:  07/22/15 195 lb (88.451 kg)  04/08/15 195 lb 12.8 oz (88.814 kg)  02/25/15 196 lb (88.905 kg)    General: Pleasant. Well developed, well nourished and in no acute distress. Very tanned. HEENT: Normal. Neck: Supple, no JVD, carotid bruits, or masses noted.  Cardiac: Regular rate and rhythm. No murmurs, rubs, or gallops. No edema.  Respiratory:  Lungs are clear to auscultation bilaterally with normal work of breathing.  GI: Soft and nontender.  MS: No deformity or atrophy. Gait and ROM intact. Skin: Warm and dry. Color is normal.  Neuro:  Strength and sensation are intact and no gross focal deficits noted.  Psych: Alert, appropriate and with normal affect.   LABORATORY DATA:  EKG:  EKG is ordered today. This shows  NSR, inferior Q's and nonspecific changes - unchanged  Lab Results  Component Value Date   WBC 3.9* 02/24/2015   HGB 14.8 02/24/2015   HCT 43.0 02/24/2015   PLT 130* 02/24/2015   GLUCOSE 227* 02/24/2015   CHOL 123 04/11/2011   TRIG 119.0 04/11/2011   HDL 41.60 04/11/2011   LDLCALC 58 04/11/2011   ALT 28 06/27/2013   AST 17 06/27/2013   NA 139 02/24/2015   K 3.9 02/24/2015   CL 104 02/24/2015    CREATININE 0.99 02/24/2015   BUN 17 02/24/2015   CO2 24 02/24/2015   INR 1.05 06/25/2013   HGBA1C 8.4* 06/26/2013    BNP (last 3 results) No results for input(s): BNP in the last 8760 hours.  ProBNP (last 3 results) No results for input(s): PROBNP in the last 8760 hours.   Other Studies Reviewed Today:   Myoview stress test from 02/2015: -No T wave inversion was noted during stress. -There was no ST segment deviation noted during stress. -The study is normal. There is no ischemia. -This is a low risk study. -Nuclear stress EF: 62%.  Assessment/Plan:  1. CAD with history of unstable angina - his last cath dates back to November of 2015 and he had FFR and no lesions intervened on. Hunter Mcbride has previously reviewed as well. Recent low risk Myoview from January of 2017. Symptoms now escalating despite medical therapy - cardiac catheterization felt to be best - The patient understands that risks include but are not limited to stroke (1 in 1000), death (1 in 1000), kidney failure [usually temporary] (1 in 500), bleeding (1 in 200), allergic reaction [possibly serious] (1 in 200), and agrees to proceed. Arranged for tomorrow am with Hunter Mcbride who has reviewed prior films. Holding Metformin until Saturday. Hold Invokana tomorrow AM. STAT lab today.   2. HTN - BP is up - will increase Norvasc to BID  3. HLD - on statin - labs followed by PCP.  4. DM - followed by Dr. Joylene Draft.  5. Mildly dilated aortic root/ascending aorta - not seen on our last echo from May 2015 - would follow. Will see what cath shows and consider echo on return if needed.   Current medicines are reviewed with the patient today.  The patient does not have concerns regarding medicines other than what has been noted above.  The following changes have been made:  See above.  Labs/ tests ordered today include:    Orders Placed This Encounter  Procedures  . Basic metabolic panel  . CBC  . Protime-INR  . APTT  .  EKG 12-Lead  . ECHOCARDIOGRAM COMPLETE     Disposition:   FU with me in 6 months.   Patient is agreeable to this plan and will call if any problems develop in the interim.   Signed: Burtis Junes, RN, ANP-C 07/22/2015 9:05 AM  [facility] [address] [place], [place]  [address] Phone: [phone] Fax: [phone]

## 2015-07-23 NOTE — Interval H&P Note (Signed)
Cath Lab Visit (complete for each Cath Lab visit)  Clinical Evaluation Leading to the Procedure:   ACS: No.  Non-ACS:    Anginal Classification: CCS III  Anti-ischemic medical therapy: Maximal Therapy (2 or more classes of medications)  Non-Invasive Test Results: No non-invasive testing performed  Prior CABG: No previous CABG      History and Physical Interval Note:  07/23/2015 7:33 AM  Hunter Mcbride  has presented today for surgery, with the diagnosis of unstable angina  The various methods of treatment have been discussed with the patient and family. After consideration of risks, benefits and other options for treatment, the patient has consented to  Procedure(s): Left Heart Cath and Coronary Angiography (N/A) as a surgical intervention .  The patient's history has been reviewed, patient examined, no change in status, stable for surgery.  I have reviewed the patient's chart and labs.  Questions were answered to the patient's satisfaction.     Sherren Mocha

## 2015-07-23 NOTE — Discharge Instructions (Signed)
Radial Site Care °Refer to this sheet in the next few weeks. These instructions provide you with information about caring for yourself after your procedure. Your health care provider may also give you more specific instructions. Your treatment has been planned according to current medical practices, but problems sometimes occur. Call your health care provider if you have any problems or questions after your procedure. °WHAT TO EXPECT AFTER THE PROCEDURE °After your procedure, it is typical to have the following: °· Bruising at the radial site that usually fades within 1-2 weeks. °· Blood collecting in the tissue (hematoma) that may be painful to the touch. It should usually decrease in size and tenderness within 1-2 weeks. °HOME CARE INSTRUCTIONS °· Take medicines only as directed by your health care provider. °· You may shower 24-48 hours after the procedure or as directed by your health care provider. Remove the bandage (dressing) and gently wash the site with plain soap and water. Pat the area dry with a clean towel. Do not rub the site, because this may cause bleeding. °· Do not take baths, swim, or use a hot tub until your health care provider approves. °· Check your insertion site every day for redness, swelling, or drainage. °· Do not apply powder or lotion to the site. °· Do not flex or bend the affected arm for 24 hours or as directed by your health care provider. °· Do not push or pull heavy objects with the affected arm for 24 hours or as directed by your health care provider. °· Do not lift over 10 lb (4.5 kg) for 5 days after your procedure or as directed by your health care provider. °· Ask your health care provider when it is okay to: °¨ Return to work or school. °¨ Resume usual physical activities or sports. °¨ Resume sexual activity. °· Do not drive home if you are discharged the same day as the procedure. Have someone else drive you. °· You may drive 24 hours after the procedure unless otherwise  instructed by your health care provider. °· Do not operate machinery or power tools for 24 hours after the procedure. °· If your procedure was done as an outpatient procedure, which means that you went home the same day as your procedure, a responsible adult should be with you for the first 24 hours after you arrive home. °· Keep all follow-up visits as directed by your health care provider. This is important. °SEEK MEDICAL CARE IF: °· You have a fever. °· You have chills. °· You have increased bleeding from the radial site. Hold pressure on the site. °SEEK IMMEDIATE MEDICAL CARE IF: °· You have unusual pain at the radial site. °· You have redness, warmth, or swelling at the radial site. °· You have drainage (other than a small amount of blood on the dressing) from the radial site. °· The radial site is bleeding, and the bleeding does not stop after 30 minutes of holding steady pressure on the site. °· Your arm or hand becomes pale, cool, tingly, or numb. °  °This information is not intended to replace advice given to you by your health care provider. Make sure you discuss any questions you have with your health care provider. °  °Document Released: 03/05/2010 Document Revised: 02/21/2014 Document Reviewed: 08/19/2013 °Elsevier Interactive Patient Education ©2016 [employer] ° °

## 2015-07-24 ENCOUNTER — Other Ambulatory Visit: Payer: Self-pay | Admitting: *Deleted

## 2015-07-24 ENCOUNTER — Telehealth: Payer: Self-pay | Admitting: *Deleted

## 2015-07-24 DIAGNOSIS — I25118 Atherosclerotic heart disease of native coronary artery with other forms of angina pectoris: Secondary | ICD-10-CM

## 2015-07-24 NOTE — Telephone Encounter (Signed)
S/w pt's wife per DPR set up appointment for GXT on June 13 when Dr. Burt Knack is in the office.  Went over instructions for day of test to hold atenolol and could eat a very light breakfast due to metformin and invokana per Truitt Merle, NP, orders in, appointment made.

## 2015-07-27 DIAGNOSIS — Z6828 Body mass index (BMI) 28.0-28.9, adult: Secondary | ICD-10-CM | POA: Diagnosis not present

## 2015-07-27 DIAGNOSIS — T1490XA Injury, unspecified, initial encounter: Secondary | ICD-10-CM | POA: Insufficient documentation

## 2015-07-27 DIAGNOSIS — S40862A Insect bite (nonvenomous) of left upper arm, initial encounter: Secondary | ICD-10-CM | POA: Diagnosis not present

## 2015-07-28 ENCOUNTER — Ambulatory Visit: Payer: PPO | Admitting: Nurse Practitioner

## 2015-07-28 ENCOUNTER — Ambulatory Visit (INDEPENDENT_AMBULATORY_CARE_PROVIDER_SITE_OTHER): Payer: PPO

## 2015-07-28 DIAGNOSIS — I25118 Atherosclerotic heart disease of native coronary artery with other forms of angina pectoris: Secondary | ICD-10-CM

## 2015-07-28 LAB — EXERCISE TOLERANCE TEST
Estimated workload: 13.3 METS
Exercise duration (min): 11 min
Exercise duration (sec): 0 s
MPHR: 152 {beats}/min
Peak HR: 127 {beats}/min
Percent HR: 85 %
Percent of predicted max HR: 83 %
RPE: 17
Rest HR: 56 {beats}/min
Stage 1 DBP: 84 mmHg
Stage 1 Grade: 0 %
Stage 1 HR: 70 {beats}/min
Stage 1 SBP: 150 mmHg
Stage 1 Speed: 0 mph
Stage 2 Grade: 0 %
Stage 2 HR: 71 {beats}/min
Stage 2 Speed: 1 mph
Stage 3 Grade: 0.1 %
Stage 3 HR: 71 {beats}/min
Stage 3 Speed: 1 mph
Stage 4 DBP: 83 mmHg
Stage 4 Grade: 10 %
Stage 4 HR: 83 {beats}/min
Stage 4 SBP: 188 mmHg
Stage 4 Speed: 1.7 mph
Stage 5 DBP: 79 mmHg
Stage 5 Grade: 12 %
Stage 5 HR: 94 {beats}/min
Stage 5 SBP: 200 mmHg
Stage 5 Speed: 2.5 mph
Stage 6 DBP: 89 mmHg
Stage 6 Grade: 14 %
Stage 6 HR: 109 {beats}/min
Stage 6 SBP: 215 mmHg
Stage 6 Speed: 3.4 mph
Stage 7 Grade: 16 %
Stage 7 HR: 127 {beats}/min
Stage 7 Speed: 4.2 mph
Stage 8 Grade: 0 %
Stage 8 HR: 108 {beats}/min
Stage 8 Speed: 0 mph
Stage 9 DBP: 86 mmHg
Stage 9 Grade: 0 %
Stage 9 HR: 76 {beats}/min
Stage 9 SBP: 186 mmHg
Stage 9 Speed: 0 mph

## 2015-08-11 DIAGNOSIS — B359 Dermatophytosis, unspecified: Secondary | ICD-10-CM | POA: Diagnosis not present

## 2015-08-11 DIAGNOSIS — D485 Neoplasm of uncertain behavior of skin: Secondary | ICD-10-CM | POA: Diagnosis not present

## 2015-08-11 DIAGNOSIS — L309 Dermatitis, unspecified: Secondary | ICD-10-CM | POA: Diagnosis not present

## 2015-08-12 ENCOUNTER — Ambulatory Visit: Payer: PPO | Admitting: Nurse Practitioner

## 2015-08-19 DIAGNOSIS — E119 Type 2 diabetes mellitus without complications: Secondary | ICD-10-CM | POA: Diagnosis not present

## 2015-09-02 ENCOUNTER — Other Ambulatory Visit: Payer: Self-pay | Admitting: *Deleted

## 2015-09-02 MED ORDER — ISOSORBIDE MONONITRATE ER 30 MG PO TB24: 30.0000 mg | ORAL_TABLET | Freq: Every day | ORAL | Status: DC

## 2015-09-08 DIAGNOSIS — R1310 Dysphagia, unspecified: Secondary | ICD-10-CM | POA: Insufficient documentation

## 2015-09-24 ENCOUNTER — Telehealth: Payer: Self-pay | Admitting: Cardiovascular Disease

## 2015-09-24 NOTE — Telephone Encounter (Signed)
Wife calling requesting samples of Ranexa 500 mg BID. Checked samples closet and at this time we do not have any available.  Advised wife.  She thinks that they do have refills at [facility].  Advised her to check and if not to call us and will send Rx into pharmacy. Also advised she could check with [place] in next week or so if we have received supply.  She verbalizes understanding.

## 2015-09-24 NOTE — Telephone Encounter (Signed)
New message     FYI     Pt wife returning nurse call and wants the nurse to know that pt needs Ranexa 500 mg, twice a day.

## 2015-09-30 ENCOUNTER — Telehealth: Payer: Self-pay | Admitting: Nurse Practitioner

## 2015-09-30 NOTE — Telephone Encounter (Signed)
Returned pt's wife call to let her know we do not have Ranexa samples at the moment. She would like his nurse or someone else to call the drug rep and ask for samples.  I will route this message to Mindy to review for when she comes back to possibly contact the drug rep about more samples.

## 2015-09-30 NOTE — Telephone Encounter (Signed)
Patient calling the office for samples of medication:   1.  What medication and dosage are you requesting samples for? Ranexa  2.  Are you currently out of this medication? Just enough until Friday

## 2015-10-01 NOTE — Telephone Encounter (Signed)
Spoke with ranexa rep and she is out of state but will come by the office next week to bring samples. I called patient to let him know, he did not answer. I will try again later.

## 2015-10-02 ENCOUNTER — Other Ambulatory Visit: Payer: Self-pay | Admitting: Gastroenterology

## 2015-10-02 DIAGNOSIS — R1310 Dysphagia, unspecified: Secondary | ICD-10-CM | POA: Diagnosis not present

## 2015-10-02 NOTE — Telephone Encounter (Signed)
Spoke with patient and he is aware the the rep will bring samples next week and I will call him when they are ready for him to pick up. He stated that he will try to get a few to get him through the weekend. He is aware that we are not able to provide samples on an ongoing monthly basis.

## 2015-10-06 ENCOUNTER — Ambulatory Visit
Admission: RE | Admit: 2015-10-06 | Discharge: 2015-10-06 | Disposition: A | Discharge status: Home / Self Care | Payer: PPO | Source: Ambulatory Visit | Attending: Gastroenterology | Admitting: Gastroenterology

## 2015-10-06 DIAGNOSIS — R1310 Dysphagia, unspecified: Secondary | ICD-10-CM | POA: Diagnosis not present

## 2015-10-06 NOTE — Telephone Encounter (Signed)
Patient aware samples at the front desk.

## 2015-10-09 DIAGNOSIS — M25551 Pain in right hip: Secondary | ICD-10-CM | POA: Diagnosis not present

## 2015-10-13 DIAGNOSIS — M25551 Pain in right hip: Secondary | ICD-10-CM | POA: Diagnosis not present

## 2015-10-16 ENCOUNTER — Encounter: Payer: Self-pay | Admitting: Nurse Practitioner

## 2015-11-02 DIAGNOSIS — E119 Type 2 diabetes mellitus without complications: Secondary | ICD-10-CM | POA: Diagnosis not present

## 2015-11-02 DIAGNOSIS — H40003 Preglaucoma, unspecified, bilateral: Secondary | ICD-10-CM | POA: Diagnosis not present

## 2015-11-03 ENCOUNTER — Encounter: Payer: Self-pay | Admitting: Nurse Practitioner

## 2015-11-03 ENCOUNTER — Ambulatory Visit (INDEPENDENT_AMBULATORY_CARE_PROVIDER_SITE_OTHER): Payer: PPO | Admitting: Nurse Practitioner

## 2015-11-03 VITALS — BP 132/68 | HR 62 | Ht 69.0 in | Wt 193.4 lb

## 2015-11-03 DIAGNOSIS — E785 Hyperlipidemia, unspecified: Secondary | ICD-10-CM

## 2015-11-03 DIAGNOSIS — I259 Chronic ischemic heart disease, unspecified: Secondary | ICD-10-CM

## 2015-11-03 DIAGNOSIS — I10 Essential (primary) hypertension: Secondary | ICD-10-CM

## 2015-11-03 NOTE — Progress Notes (Signed)
CARDIOLOGY OFFICE NOTE  Date:  11/03/2015    Hunter Mcbride Date of Birth: Jun 23, 1945 Medical Record #[id number]  PCP:  Hunter Ly, MD  Cardiologist:  Hunter Mcbride    Chief Complaint  Patient presents with  . Coronary Artery Disease    3 month check - seen for Dr. Acie Mcbride    History of Present Illness: Hunter Mcbride is a 70 y.o. male who presents today for a 3 month check. Seen for Dr. Acie Mcbride. His wife is a former patient of Dr. Susa Mcbride that I have taken care of.   He has a history of CAD, HTN, HLD, DM and OSA.   He presented back in the spring of 2015 with a 2 month history of exertional chest pain - had abnormal Myoview - then cathed and had DES x 3 to the LAD. Otherwise had moderate but nonobstructive disease With a normal EF of 55 to 60%. Dscharged on Plavix. After his initial event - he went back to the ER with chest pain and was recathed - to continue with medical management. He has not tolerated [place] active NTG in the past despite several attempts.   In November of 2015 - he went hunting with his son out of state. He had been cutting trees and setting up stands. Developed SSCP that initially resolved but then returned, subsequently associated with SOB and nausea - ended up going to the hospital in [place] (after first presenting to an urgent care like facility that did not offer cardiac cath) - was recathed - actually twice. Had normal left heart pressures, patent stents extending from the proximal to the distal LAD and severe diffuse disease involving the branches of all 3 major epicardial vessels. No clear culprit lesion to intervene on - then had Myoview which showed normal perfusion and normal EF at 59%. Was recathed and had FFR of the first OM, posterior descending and distal RCA which were nonsignificant - no PCI was performed. CXR was normal. Echo was also updated - normal EF - mildly dilated aorta noted with mild LVH. I did not get a discharge summary -  they brought a RX in for 1000 mg of Ranexa - this was not filled.  I saw him back after his return from [place]. He reiterated his story again about what happened to him in [place]. He did probably over exert him self - had walked 6 1/2 miles in the process of putting up the stands/cutting trees, etc. Had pretty classic chest pain associated with dyspnea and feeling of "impending doom". He notes that his symptoms seemed to abate after the second cath. He did take the higher dose of Ranexa but had some side effects (low BP, couldn't think, stomach upset, dizziness) and cut the dose back - he has remained on this dose.   Since that trip - he had had some fleeting pain - he did bring his cath CDs and these were reviewed by Dr. Burt Mcbride. He agreed with continued medical therapy. Films were loaded in the [facility] system.   In the hospital back in January of 2017 with angina - low risk Myoview noted. Continued on medical management. Last seen back in February and was felt to be at his baseline. He will have some episodes of chest pain. Rechallenged him with Imdur. Has not wanted to increase Ranexa due to cost.  I last saw him back in June - he was not doing well - having more angina -  ended up getting him recathed.   Comes back today. Here with Hunter Mcbride, his wife. Has had a recent hip injection. Apparently I am not in his network - he has been getting bills - trying to get that worked out. He has been to [place] earlier this month - did not kill any deer - pretty hot - not as much fun as he had had in the past -  had a [place] spell of chest pain the first day after he got there - did eat "a lot of crap/sweets". Did fine afterwards - walked about 3 miles a day and was ok. Sees Dr. Joylene Mcbride later this week. Recent eye check. Overall, he feels like he is doing ok.    Past Medical History:  Diagnosis Date  . Arthritis    "lower back; right shoulder; left thumb; joints" (06/21/2013)  . Asthma    "not sure if this is  true or not" (06/21/2013)  . CAD (coronary artery disease) May 2015   s/p PTCA/DES x 3 to mid LAD, mod non-ob disease in Cx and RCA May 2015; s/p repeat caths - last study in November of 2015 - stents patent, - FFR - managed medically.   [name] GERD (gastroesophageal reflux disease)   . Gout    "maybe twice in my life"  . Headache(784.0)    "weekly for the last 3-4 months" (06/21/2013)  . Hyperlipidemia   . Hypertension   . Myocardial infarction [facility])    "/Dr. Katharina Mcbride I've had one between 2014-2015) (06/21/2013)  . OSA on CPAP   . Osteoarthritis   . Type II diabetes mellitus (Hunter Mcbride)     Past Surgical History:  Procedure Laterality Date  . CARDIAC CATHETERIZATION  1980's   "once"  . CARDIAC CATHETERIZATION N/A 07/23/2015   Procedure: Left Heart Cath and Coronary Angiography;  Surgeon: Hunter Mocha, MD;  Location: [facility];  Service: Cardiovascular;  Laterality: N/A;  . CARDIOVASCULAR STRESS TEST  06/10/2008   EF 68%  . CARPAL TUNNEL RELEASE Bilateral   . CORONARY ANGIOPLASTY WITH STENT PLACEMENT  06/21/2013   "3"  . EXCISION MORTON'S NEUROMA Left   . EYE SURGERY Left    "removed film over my eye"  . FRACTIONAL FLOW RESERVE WIRE N/A 06/26/2013   Procedure: FRACTIONAL FLOW RESERVE WIRE;  Surgeon: [name], MD;  Location: [facility];  Service: Cardiovascular;  Laterality: N/A;  . HERNIA REPAIR Left   . JOINT REPLACEMENT Left 03/2013   "thumb"  . LEFT HEART CATHETERIZATION WITH CORONARY ANGIOGRAM N/A 06/21/2013   Procedure: LEFT HEART CATHETERIZATION WITH CORONARY ANGIOGRAM;  Surgeon: Burnell Blanks, MD;  Location: [facility] C[facility];  Service: Cardiovascular;  Laterality: N/A;  . LEFT HEART CATHETERIZATION WITH CORONARY ANGIOGRAM N/A 06/26/2013   Procedure: LEFT HEART CATHETERIZATION WITH CORONARY ANGIOGRAM;  Surgeon: [name], MD;  Location: [facility];  Service: Cardiovascular;  Laterality: N/A;  . TONSILLECTOMY    . TOTAL SHOULDER ARTHROPLASTY  03/01/2012   Procedure:  TOTAL SHOULDER ARTHROPLASTY;  Surgeon: Marin Shutter, MD;  Location: [facility];  Service: Orthopedics;  Laterality: Right;  . UMBILICAL HERNIA REPAIR       Medications: Current Outpatient Prescriptions  Medication Sig Dispense Refill  . acetaminophen (TYLENOL ARTHRITIS PAIN) 650 MG CR tablet Take 1,300 mg by mouth 2 (two) times daily as needed. For pain    . ALPRAZolam (XANAX) 0.5 MG tablet Take 0.5 mg by mouth every 6 (six) hours as needed for anxiety.    [name]  amLODipine (NORVASC) 5 MG tablet Take 1 tablet (5 mg total) by mouth 2 (two) times daily. 180 tablet 3  . aspirin 81 MG tablet Take 81 mg by mouth at bedtime.     [name] atenolol (TENORMIN) 50 MG tablet Take 1 tablet (50 mg total) by mouth daily. 90 tablet 1  . atorvastatin (LIPITOR) 80 MG tablet Take 40 mg by mouth daily.     . clopidogrel (PLAVIX) 75 MG tablet Take 1 tablet (75 mg total) by mouth daily with breakfast. 90 tablet 1  . gabapentin (NEURONTIN) 300 MG capsule Take 300 mg by mouth at bedtime.      . hydrochlorothiazide (HYDRODIURIL) 25 MG tablet Take 12.5 mg by mouth 3 (three) times a week. Monday, Wednesday and Friday    . INVOKANA 100 MG TABS tablet Take 300 mg by mouth daily before breakfast.   2  . irbesartan (AVAPRO) 300 MG tablet Take 0.5 tablets (150 mg total) by mouth daily. 90 tablet 3  . isosorbide mononitrate (IMDUR) 30 MG 24 hr tablet Take 1 tablet (30 mg total) by mouth daily. 90 tablet 0  . metFORMIN (GLUCOPHAGE-XR) 500 MG 24 hr tablet Take 1,000 mg by mouth 2 (two) times daily.     . nitroGLYCERIN (NITROSTAT) 0.4 MG SL tablet Place 1 tablet (0.4 mg total) under the tongue every 5 (five) minutes as needed. For chest pain 25 tablet 5  . pantoprazole (PROTONIX) 40 MG tablet Take 40 mg by mouth 2 (two) times daily.    . ranolazine (RANEXA) 500 MG 12 hr tablet Take 1 tablet (500 mg total) by mouth 2 (two) times daily. 180 tablet 1  . traMADol (ULTRAM) 50 MG tablet Take 50 mg by mouth daily as needed for moderate pain.       No current facility-administered medications for this visit.     Allergies: No Known Allergies  Social History: The patient  reports that he has never smoked. He has never used smokeless tobacco. He reports that he drinks alcohol. He reports that he does not use drugs.   Family History: The patient's family history includes Coronary artery disease in his cousin; Diabetes type II in his mother; Heart attack in his father; Heart disease in his brother.   Review of Systems: Please see the history of present illness.   Otherwise, the review of systems is positive for none.   All other systems are reviewed and negative.   Physical Exam: VS:  BP 132/68   Pulse 62   Ht '5\' 9"'$  (1.753 m)   Wt 193 lb 6.4 oz (87.7 kg)   SpO2 96%   BMI 28.56 kg/m  .  BMI Body mass index is 28.56 kg/m.  Wt Readings from Last 3 Encounters:  11/03/15 193 lb 6.4 oz (87.7 kg)  07/23/15 190 lb (86.2 kg)  07/22/15 195 lb (88.5 kg)    General: Pleasant. Well developed, well nourished and in no acute distress.   HEENT: Normal.  Neck: Supple, no JVD, carotid bruits, or masses noted.  Cardiac: Regular rate and rhythm. No murmurs, rubs, or gallops. No edema.  Respiratory:  Lungs are clear to auscultation bilaterally with normal work of breathing.  GI: Soft and nontender.  MS: No deformity or atrophy. Gait and ROM intact.  Skin: Warm and dry. Color is normal.  Neuro:  Strength and sensation are intact and no gross focal deficits noted.  Psych: Alert, appropriate and with normal affect.   LABORATORY DATA:  EKG:  EKG  is not ordered today.  Lab Results  Component Value Date   WBC 3.7 (L) 07/22/2015   HGB 14.1 07/22/2015   HCT 41.4 07/22/2015   PLT 134 (L) 07/22/2015   GLUCOSE 275 (H) 07/22/2015   CHOL 123 04/11/2011   TRIG 119.0 04/11/2011   HDL 41.60 04/11/2011   LDLCALC 58 04/11/2011   ALT 28 06/27/2013   AST 17 06/27/2013   NA 140 07/22/2015   K 4.0 07/22/2015   CL 104 07/22/2015   CREATININE  0.92 07/22/2015   BUN 17 07/22/2015   CO2 23 07/22/2015   INR 1.04 07/22/2015   HGBA1C 8.4 (H) 06/26/2013    BNP (last 3 results) No results for input(s): BNP in the last 8760 hours.  ProBNP (last 3 results) No results for input(s): PROBNP in the last 8760 hours.   Other Studies Reviewed Today:  Procedures   Left Heart Cath and Coronary Angiography 07/2015  Conclusion   1. The left ventricular systolic function is normal. 2. Prox RCA to Mid RCA lesion, 40% stenosed. 3. Mid RCA lesion, 40% stenosed. 4. Ost RPDA to RPDA lesion, 70% stenosed. 5. Dist RCA lesion, 70% stenosed. 6. RPDA lesion, 75% stenosed. 7. Ost LM lesion, 40% stenosed. 8. 1st Mrg lesion, 60% stenosed. 9. Mid Cx lesion, 70% stenosed. 10. Prox LAD to Dist LAD lesion, 10% stenosed. The lesion was previously treated with a stent (unknown type). 11. Dist LAD lesion, 40% stenosed. 12. 2nd Diag lesion, 80% stenosed. 13. 1st Diag lesion, 80% stenosed.   1. Continued patency of the stented segment in the LAD 2. Severe diffuse small vessel CAD with moderate-severe stenosis of the PDA/PLA bifurcation and severe stenosis of the diagonal branches 3. Moderate LCx stenosis 4. Normal LV function  Recommend:  Films reviewed. No appreciable change from 2015 studies.  Titrate anti-anginal Rx as tolerated  FFR of the LCx and RCA was performed in 2016 with negative findings  Exercise stress test to evaluate for ischemia on maximal medical therapy    GXT Study Highlights 07/2015  Patient presents today for routine GXT. Seen for Dr. Burt Mcbride. Has had recent cardiac cath - GXT to rule out ischemia. He has done well post cath. No further angina. On higher dose of Norvasc. Reports better BP control.   Resting BP is 150/84 Target HR is 129  Today the patient exercised on the standard Bruce protocol for a total of 11 minutes.  Excellent exercise tolerance.  Mildly hypertensive blood pressure response (atenolol held  today).   Max HR is 129 Max BP is 238/90  Clinically negative for chest pain. Test was stopped due to patient request and achievement of target HR.  EKG negative for ischemia. No significant arrhythmia noted. Rare PVC noted  Recommendations: Reviewed with Dr. Burt Mcbride. Will continue with current medical management which includes the higher dose of Norvasc See back in 3 months.   Burtis Junes, RN, ANP-C     Myoview stress test from 02/2015: -No T wave inversion was noted during stress. -There was no ST segment deviation noted during stress. -The study is normal. There is no ischemia. -This is a low risk study. -Nuclear stress EF: 62%.  Assessment/Plan:  1. CAD with history of unstable angina - last cath from June of 2017 noted - negative GXT for ischemia following that study - he will continue with medical management - I think he will have some spells of angina on occasion - especially when he overexerts. I have left him  on his current regimen for now.   2. HTN - BP looks ok. No change with current regimen.    3. HLD - on statin - labs followed by PCP.  4. DM - followed by Dr. Joylene Mcbride.  5. Mildly dilated aortic root/ascending aorta - not seen on our last echo from May 2015 - would follow for now - consider recheck on return visit.   Current medicines are reviewed with the patient today.  The patient does not have concerns regarding medicines other than what has been noted above.  The following changes have been made:  See above.  Labs/ tests ordered today include:   No orders of the defined types were placed in this encounter.    Disposition:   FU with me in 4 months.   Patient is agreeable to this plan and will call if any problems develop in the interim.   Signed: Burtis Junes, RN, ANP-C 11/03/2015 8:54 AM  [facility] [address] [place], [place]  [address] Phone: [phone] Fax: [phone]

## 2015-11-03 NOTE — Patient Instructions (Signed)
We will be checking the following labs today - NONE   Medication Instructions:    Continue with your current medicines.     Testing/Procedures To Be Arranged:  N/A  Follow-Up:   See me in 4 months    Other Special Instructions:   N/A    If you need a refill on your cardiac medications before your next appointment, please call your pharmacy.   Call the  [facility] office at [phone] if you have any questions, problems or concerns.      

## 2015-11-06 DIAGNOSIS — M25551 Pain in right hip: Secondary | ICD-10-CM | POA: Diagnosis not present

## 2015-11-06 DIAGNOSIS — Z23 Encounter for immunization: Secondary | ICD-10-CM | POA: Diagnosis not present

## 2015-11-06 DIAGNOSIS — I208 Other forms of angina pectoris: Secondary | ICD-10-CM | POA: Diagnosis not present

## 2015-11-06 DIAGNOSIS — E119 Type 2 diabetes mellitus without complications: Secondary | ICD-10-CM | POA: Diagnosis not present

## 2015-11-06 DIAGNOSIS — Z6828 Body mass index (BMI) 28.0-28.9, adult: Secondary | ICD-10-CM | POA: Diagnosis not present

## 2015-11-06 DIAGNOSIS — I10 Essential (primary) hypertension: Secondary | ICD-10-CM | POA: Diagnosis not present

## 2015-11-06 DIAGNOSIS — E784 Other hyperlipidemia: Secondary | ICD-10-CM | POA: Diagnosis not present

## 2015-11-12 ENCOUNTER — Emergency Department (HOSPITAL_COMMUNITY): Payer: PPO

## 2015-11-12 ENCOUNTER — Encounter (HOSPITAL_COMMUNITY): Payer: Self-pay | Admitting: Emergency Medicine

## 2015-11-12 DIAGNOSIS — I25119 Atherosclerotic heart disease of native coronary artery with unspecified angina pectoris: Secondary | ICD-10-CM | POA: Insufficient documentation

## 2015-11-12 DIAGNOSIS — Z79899 Other long term (current) drug therapy: Secondary | ICD-10-CM | POA: Insufficient documentation

## 2015-11-12 DIAGNOSIS — E119 Type 2 diabetes mellitus without complications: Secondary | ICD-10-CM | POA: Diagnosis not present

## 2015-11-12 DIAGNOSIS — Z7984 Long term (current) use of oral hypoglycemic drugs: Secondary | ICD-10-CM | POA: Diagnosis not present

## 2015-11-12 DIAGNOSIS — Z955 Presence of coronary angioplasty implant and graft: Secondary | ICD-10-CM | POA: Insufficient documentation

## 2015-11-12 DIAGNOSIS — Z7982 Long term (current) use of aspirin: Secondary | ICD-10-CM | POA: Insufficient documentation

## 2015-11-12 DIAGNOSIS — I10 Essential (primary) hypertension: Secondary | ICD-10-CM | POA: Insufficient documentation

## 2015-11-12 DIAGNOSIS — Z96611 Presence of right artificial shoulder joint: Secondary | ICD-10-CM | POA: Diagnosis not present

## 2015-11-12 DIAGNOSIS — R0789 Other chest pain: Secondary | ICD-10-CM | POA: Diagnosis not present

## 2015-11-12 DIAGNOSIS — I209 Angina pectoris, unspecified: Secondary | ICD-10-CM | POA: Diagnosis not present

## 2015-11-12 DIAGNOSIS — R079 Chest pain, unspecified: Secondary | ICD-10-CM | POA: Diagnosis not present

## 2015-11-12 DIAGNOSIS — J45909 Unspecified asthma, uncomplicated: Secondary | ICD-10-CM | POA: Insufficient documentation

## 2015-11-12 DIAGNOSIS — I252 Old myocardial infarction: Secondary | ICD-10-CM | POA: Insufficient documentation

## 2015-11-12 DIAGNOSIS — I110 Hypertensive heart disease with heart failure: Secondary | ICD-10-CM | POA: Diagnosis not present

## 2015-11-12 LAB — BASIC METABOLIC PANEL
ANION GAP: 9 (ref 5–15)
BUN: 17 mg/dL (ref 6–20)
CALCIUM: 9.7 mg/dL (ref 8.9–10.3)
CO2: 23 mmol/L (ref 22–32)
Chloride: 105 mmol/L (ref 101–111)
Creatinine, Ser: 1.04 mg/dL (ref 0.61–1.24)
Glucose, Bld: 279 mg/dL — ABNORMAL HIGH (ref 65–99)
Potassium: 4 mmol/L (ref 3.5–5.1)
SODIUM: 137 mmol/L (ref 135–145)

## 2015-11-12 LAB — PROTIME-INR
INR: 1.04
Prothrombin Time: 13.6 seconds (ref 11.4–15.2)

## 2015-11-12 LAB — CBC
HCT: 43.4 % (ref 39.0–52.0)
HEMOGLOBIN: 14.8 g/dL (ref 13.0–17.0)
MCH: 30.6 pg (ref 26.0–34.0)
MCHC: 34.1 g/dL (ref 30.0–36.0)
MCV: 89.9 fL (ref 78.0–100.0)
Platelets: 147 10*3/uL — ABNORMAL LOW (ref 150–400)
RBC: 4.83 MIL/uL (ref 4.22–5.81)
RDW: 13.3 % (ref 11.5–15.5)
WBC: 4.9 10*3/uL (ref 4.0–10.5)

## 2015-11-12 LAB — I-STAT TROPONIN, ED: TROPONIN I, POC: 0 ng/mL (ref 0.00–0.08)

## 2015-11-12 NOTE — ED Triage Notes (Signed)
Pt. reports intermittent mid/left chest pain radiating to upper back onset Tuesday this week , mild SOB , nausea , no diaphoresis , history of CAD / coronary Stents , his cardiologist is Dr. Servando Snare .

## 2015-11-13 ENCOUNTER — Emergency Department (HOSPITAL_COMMUNITY)
Admission: EM | Admit: 2015-11-13 | Discharge: 2015-11-13 | Disposition: A | Discharge status: Home / Self Care | Payer: PPO | Attending: Emergency Medicine | Admitting: Emergency Medicine

## 2015-11-13 ENCOUNTER — Telehealth: Payer: Self-pay | Admitting: *Deleted

## 2015-11-13 DIAGNOSIS — I208 Other forms of angina pectoris: Secondary | ICD-10-CM

## 2015-11-13 LAB — I-STAT TROPONIN, ED: TROPONIN I, POC: 0 ng/mL (ref 0.00–0.08)

## 2015-11-13 NOTE — ED Provider Notes (Signed)
[facility] [facility] DEPT Provider Note   CSN: [id number] Arrival date & time: 11/12/15  2125  By signing my name below, I, Maud Deed. Royston Sinner, attest that this documentation has been prepared under the direction and in the presence of Blanchie Dessert, MD.  Electronically Signed: Maud Deed. Royston Sinner, ED Scribe. 11/13/15. 1:37 AM.    History   Chief Complaint Chief Complaint  Patient presents with  . Chest Pain   The history is provided by the patient. No language interpreter was used.    HPI Comments: Hunter Mcbride is a 70 y.o. male with a PMHx of CAD, HTN, hyperlipidemia, and DM who presents to the Emergency Department complaining of intermittent, worsening L sided/mid chest pain that radiates to the upper back x 2 days. Pain is described as pressure with an occasional burning sensation. No aggravating factors reported. However, pt states pain was mildly improved after eating a cheeseburger earlier today. Pt also reports mild shortness of breath and nausea. OTC antacids, Ranexa,  and Nitro x 3 attempted this evening at home with mild temporary improvement. No recent fever, chills, vomiting, abdominal pain, rash, or diaphoresis. Pt recently took a trip to [place] at the beginning of this month. Otherwise, no other [place] distance travel.  Blood sugars typically run 113-116 in the mornings. PSHx includes cardiac catheterization 07/2015, coronary angioplasty with stent placement x 3- 06/2013.  PCP: Jerlyn Ly, MD   CARDIOLOGIST: Grayland Jack, MD  Past Medical History:  Diagnosis Date  . Arthritis    "lower back; right shoulder; left thumb; joints" (06/21/2013)  . Asthma    "not sure if this is true or not" (06/21/2013)  . CAD (coronary artery disease) May 2015   s/p PTCA/DES x 3 to mid LAD, mod non-ob disease in Cx and RCA May 2015; s/p repeat caths - last study in November of 2015 - stents patent, - FFR - managed medically.   [name] GERD (gastroesophageal reflux disease)   . Gout    "maybe twice in  my life"  . Headache(784.0)    "weekly for the last 3-4 months" (06/21/2013)  . Hyperlipidemia   . Hypertension   . Myocardial infarction [facility])    "/Dr. Katharina Caper I've had one between 2014-2015) (06/21/2013)  . OSA on CPAP   . Osteoarthritis   . Type II diabetes mellitus [facility])     Patient Active Problem List   Diagnosis Date Noted  . Angina at rest [facility]) 02/25/2015  . Type 2 diabetes mellitus treated without insulin ([place])   . Coronary atherosclerosis of native coronary artery 06/27/2013  . Unstable angina ([place]) 06/21/2013  . Chest pain 06/20/2013  . Shoulder arthritis 03/02/2012  . Hypertension 04/11/2011  . Hyperlipidemia 04/11/2011  . Diabetes mellitus ([place]) 04/11/2011  . Chest tightness 10/13/2010    Past Surgical History:  Procedure Laterality Date  . CARDIAC CATHETERIZATION  1980's   "once"  . CARDIAC CATHETERIZATION N/A 07/23/2015   Procedure: Left Heart Cath and Coronary Angiography;  Surgeon: Sherren Mocha, MD;  Location: [place] CV LAB;  Service: Cardiovascular;  Laterality: N/A;  . CARDIOVASCULAR STRESS TEST  06/10/2008   EF 68%  . CARPAL TUNNEL RELEASE Bilateral   . CORONARY ANGIOPLASTY WITH STENT PLACEMENT  06/21/2013   "3"  . EXCISION MORTON'S NEUROMA Left   . EYE SURGERY Left    "removed film over my eye"  . FRACTIONAL FLOW RESERVE WIRE N/A 06/26/2013   Procedure: FRACTIONAL FLOW RESERVE WIRE;  Surgeon: [name], MD;  Location:  [place] CATH LAB;  Service: Cardiovascular;  Laterality: N/A;  . HERNIA REPAIR Left   . JOINT REPLACEMENT Left 03/2013   "thumb"  . LEFT HEART CATHETERIZATION WITH CORONARY ANGIOGRAM N/A 06/21/2013   Procedure: LEFT HEART CATHETERIZATION WITH CORONARY ANGIOGRAM;  Surgeon: Burnell Blanks, MD;  [facility] CATH LAB;  Service: Cardiovascular;  Laterality: N/A;  . LEFT HEART CATHETERIZATION WITH CORONARY ANGIOGRAM N/A 06/26/2013   Procedure: LEFT HEART CATHETERIZATION WITH CORONARY ANGIOGRAM;  Surgeon: [name], MD;  Location:  [place] CATH LAB;  Service: Cardiovascular;  Laterality: N/A;  . TONSILLECTOMY    . TOTAL SHOULDER ARTHROPLASTY  03/01/2012   Procedure: TOTAL SHOULDER ARTHROPLASTY;  Surgeon: Marin Shutter, MD;  Location: [place];  Service: Orthopedics;  Laterality: Right;  . UMBILICAL HERNIA REPAIR         Home Medications    Prior to Admission medications   Medication Sig Start Date End Date Taking? Authorizing Provider  acetaminophen (TYLENOL ARTHRITIS PAIN) 650 MG CR tablet Take 1,300 mg by mouth 2 (two) times daily as needed. For pain    Historical Provider, MD  ALPRAZolam Duanne Moron) 0.5 MG tablet Take 0.5 mg by mouth every 6 (six) hours as needed for anxiety.    Historical Provider, MD  amLODipine (NORVASC) 5 MG tablet Take 1 tablet (5 mg total) by mouth 2 (two) times daily. 07/22/15   Burtis Junes, NP  aspirin 81 MG tablet Take 81 mg by mouth at bedtime.     Historical Provider, MD  atenolol (TENORMIN) 50 MG tablet Take 1 tablet (50 mg total) by mouth daily. 02/17/15   Burtis Junes, NP  atorvastatin (LIPITOR) 80 MG tablet Take 40 mg by mouth daily.     Historical Provider, MD  clopidogrel (PLAVIX) 75 MG tablet Take 1 tablet (75 mg total) by mouth daily with breakfast. 02/17/15   Burtis Junes, NP  gabapentin (NEURONTIN) 300 MG capsule Take 300 mg by mouth at bedtime.      Historical Provider, MD  hydrochlorothiazide (HYDRODIURIL) 25 MG tablet Take 12.5 mg by mouth 3 (three) times a week. Monday, Wednesday and Friday 05/28/13   Historical Provider, MD  INVOKANA 100 MG TABS tablet Take 300 mg by mouth daily before breakfast.  04/29/14   Historical Provider, MD  irbesartan (AVAPRO) 300 MG tablet Take 0.5 tablets (150 mg total) by mouth daily. 02/25/15   Barton Dubois, MD  isosorbide mononitrate (IMDUR) 30 MG 24 hr tablet Take 1 tablet (30 mg total) by mouth daily. 09/02/15   Burtis Junes, NP  metFORMIN (GLUCOPHAGE-XR) 500 MG 24 hr tablet Take 1,000 mg by mouth 2 (two) times daily.  06/27/13   Rhonda G Barrett,  PA-C  nitroGLYCERIN (NITROSTAT) 0.4 MG SL tablet Place 1 tablet (0.4 mg total) under the tongue every 5 (five) minutes as needed. For chest pain 01/02/15   Thayer Headings, MD  pantoprazole (PROTONIX) 40 MG tablet Take 40 mg by mouth 2 (two) times daily.    Historical Provider, MD  ranolazine (RANEXA) 500 MG 12 hr tablet Take 1 tablet (500 mg total) by mouth 2 (two) times daily. 02/17/15   Burtis Junes, NP  traMADol (ULTRAM) 50 MG tablet Take 50 mg by mouth daily as needed for moderate pain.  04/22/13   Historical Provider, MD    Family History Family History  Problem Relation Age of Onset  . Diabetes type II Mother   . Heart attack Father   . Coronary artery  disease Cousin   . Heart disease Brother     Social History Social History  Substance Use Topics  . Smoking status: Never Smoker  . Smokeless tobacco: Never Used  . Alcohol use Yes     Allergies   Review of patient's allergies indicates no known allergies.   Review of Systems Review of Systems  Constitutional: Negative for chills, diaphoresis and fever.  Respiratory: Positive for shortness of breath.   Cardiovascular: Positive for chest pain.  Gastrointestinal: Positive for nausea. Negative for vomiting.  Skin: Negative for rash.  All other systems reviewed and are negative.    Physical Exam Updated Vital Signs BP 150/76   Pulse 63   Temp 98.4 F (36.9 C) (Oral)   Resp 18   SpO2 96%   Physical Exam  Constitutional: He is oriented to person, place, and time. He appears well-developed and well-nourished.  HENT:  Head: Normocephalic and atraumatic.  Eyes: EOM are normal.  Neck: Normal range of motion.  Cardiovascular: Normal rate, regular rhythm, normal heart sounds and intact distal pulses.   Pulmonary/Chest: Effort normal and breath sounds normal. No respiratory distress.  Abdominal: Soft. He exhibits no distension. There is no tenderness.  Musculoskeletal: Normal range of motion.  Neurological: He is  alert and oriented to person, place, and time.  Skin: Skin is warm and dry.  Psychiatric: He has a normal mood and affect. Judgment normal.  Nursing note and vitals reviewed.    ED Treatments / Results   DIAGNOSTIC STUDIES: Oxygen Saturation is 96% on RA, adequate by my interpretation.    COORDINATION OF CARE: 1:20 AM- Will order blood work and EKG. Discussed treatment plan with pt at bedside and pt agreed to plan.     Labs (all labs ordered are listed, but only abnormal results are displayed) Labs Reviewed  BASIC METABOLIC PANEL - Abnormal; Notable for the following:       Result Value   Glucose, Bld 279 (*)    All other components within normal limits  CBC - Abnormal; Notable for the following:    Platelets 147 (*)    All other components within normal limits  PROTIME-INR  I-STAT TROPOININ, ED  I-STAT TROPOININ, ED    EKG  EKG Interpretation  Date/Time:  Thursday November 12 2015 21:27:57 EDT Ventricular Rate:  61 PR Interval:  208 QRS Duration: 94 QT Interval:  404 QTC Calculation: 406 R Axis:   15 Text Interpretation:  Normal sinus rhythm Normal ECG No significant change since last tracing Confirmed by Maryan Rued  MD, Loree Fee ([id number]) on 11/13/2015 12:56:44 AM       Radiology Dg Chest 2 View  Result Date: 11/12/2015 CLINICAL DATA:  71 year old male with left-sided chest pain. EXAM: CHEST  2 VIEW COMPARISON:  Chest radiograph dated 02/24/2015 FINDINGS: Two views of the chest do not demonstrate a focal consolidation. There is no pleural effusion or pneumothorax. The cardiac silhouette is within normal limits. No acute osseous pathology. Right shoulder hemiarthroplasty. IMPRESSION: No active cardiopulmonary disease. Electronically Signed   By: Anner Crete M.D.   On: 11/12/2015 22:26    Procedures Procedures (including critical care time)  Medications Ordered in ED Medications - No data to display   Initial Impression / Assessment and Plan / ED Course  I  have reviewed the triage vital signs and the nursing notes.  Pertinent labs & imaging results that were available during my care of the patient were reviewed by me and considered in my medical decision  making (see chart for details).  Clinical Course   Patient is a 70 year old gentleman with a past medical history significant for multi-vessel coronary artery disease to last had catheterization in June of this year with multiple vessels with greater than 40% stenosis and multiple vessels with greater than 75% stenosis who is presenting today with waxing and waning chest pain since Tuesday worsening today causing nausea and back pain. Patient took 3 nitroglycerin and Ranexa prior to arrival and states in the last hour his pain is starting to resolve. He is currently hemodynamically stable in no acute distress. He denies any symptoms related to eating or respiratory complaints. EKG and labs without acute findings except for hyperglycemia. Given patient's extensive past history and description of symptoms concern that symptoms are anginal equivalents. Will discuss with cardiology  4:33 AM Patient is now pain-free and cardiology feels comfortable letting him go home. Will increase imdur to 60 mg in the morning and f/u.  Final Clinical Impressions(s) / ED Diagnoses   Final diagnoses:  Angina at rest [facility])    New Prescriptions Discharge Medication List as of 11/13/2015  4:35 AM     I personally performed the services described in this documentation, which was scribed in my presence.  The recorded information has been reviewed and considered.    Blanchie Dessert, MD 11/16/15 2007

## 2015-11-13 NOTE — Telephone Encounter (Signed)
S/w pt's wife per DPR is aware Dr. Acie Fredrickson will be seeing pt per Truitt Merle, NP, just to lay another set of eyes on pt.  Stated verbal understanding.

## 2015-11-19 ENCOUNTER — Other Ambulatory Visit: Payer: Self-pay | Admitting: Nurse Practitioner

## 2015-11-19 ENCOUNTER — Telehealth: Payer: Self-pay | Admitting: Nurse Practitioner

## 2015-11-19 ENCOUNTER — Telehealth: Payer: Self-pay | Admitting: *Deleted

## 2015-11-19 MED ORDER — ISOSORBIDE MONONITRATE ER 30 MG PO TB24: 30.0000 mg | ORAL_TABLET | Freq: Two times a day (BID) | ORAL | 0 refills | Status: DC

## 2015-11-19 NOTE — Telephone Encounter (Signed)
Vicenta Aly, CMA, stated pt's wife is on phone, pt was in hospital after Truitt Merle, NP, ov and imdur was increased to (30 mg ) bid.  Pt's wife call in to get refill on medication stated hospital did not give pt enough medication.  T/w Truitt Merle, NP, and stated was ok to refill medication.

## 2015-11-19 NOTE — Telephone Encounter (Signed)
New message     pharmacist calling for RN  Pt c/o medication issue:  1. Name of Medication: Isosorbide '30mg'$   2. How are you currently taking this medication (dosage and times per day)? Need to know if the '30mg'$  has been increased  3. Are you having a reaction (difficulty breathing--STAT)? na  4. What is your medication issue? clarification on how many mg the prescription should be

## 2015-11-19 NOTE — Telephone Encounter (Signed)
Pt's medication of Isosorbide mononitrate 30 mg tablet was increased to 60 mg tablet daily when pt went to the hospital on 11/13/15. Per Truitt Merle, NP, ok to refill pt medication, 30 day supply until pt comes in to see Dr. Acie Fredrickson on 12/04/15. I informed pt's wife that pt's Rx was sent to pt's pharmacy and if the pt has any other problems, questions or concerns to call the office. Wife verbalized understanding.

## 2015-11-19 NOTE — Telephone Encounter (Signed)
Pt's Rx was sent to pt's pharmacy as requested. Confirmation received.  °

## 2015-11-25 DIAGNOSIS — M25551 Pain in right hip: Secondary | ICD-10-CM | POA: Diagnosis not present

## 2015-11-30 DIAGNOSIS — T1592XA Foreign body on external eye, part unspecified, left eye, initial encounter: Secondary | ICD-10-CM | POA: Diagnosis not present

## 2015-12-04 ENCOUNTER — Encounter: Payer: Self-pay | Admitting: Cardiovascular Disease

## 2015-12-04 ENCOUNTER — Ambulatory Visit (INDEPENDENT_AMBULATORY_CARE_PROVIDER_SITE_OTHER): Payer: PPO | Admitting: Cardiovascular Disease

## 2015-12-04 VITALS — BP 120/70 | HR 61 | Ht 69.0 in | Wt 194.0 lb

## 2015-12-04 DIAGNOSIS — I208 Other forms of angina pectoris: Secondary | ICD-10-CM | POA: Diagnosis not present

## 2015-12-04 DIAGNOSIS — I2510 Atherosclerotic heart disease of native coronary artery without angina pectoris: Secondary | ICD-10-CM | POA: Diagnosis not present

## 2015-12-04 MED ORDER — RANOLAZINE ER 1000 MG PO TB12: 1000.0000 mg | ORAL_TABLET | Freq: Two times a day (BID) | ORAL | 11 refills | Status: DC

## 2015-12-04 NOTE — Progress Notes (Signed)
[name] Date of Birth  1946-01-02 [place] [address]    [place]   [place], [place]  [address]    [place], [place]  [address] [phone]  Fax  [phone]  [phone]  Fax [phone]  Problem list: 1. History of chest pain-negative stress Myoview study in April, 2010 2. Hyperlipidemia 3. Hypertension 4. Diabetes mellitus 5. Sleep apnea   Trooper is doing well.  His quite active. 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 He's not had any episodes of chest pain, shortness breath, syncope, or presyncope.  06/05/2013:    Rydan is doing well.  He has been [place] hunting this am.   No CP or dyspnea.  He has noticed that he has some mild vague chest and arm ache - especially with walking.  He can usually walk through the discomfort and the pain will eventually resolve.  He noticed this discomfort this am while hunting.   He has also noticed this while push mowing.   These have been going on for about 1 year.    He had a myoview in 2012 which was negative.    12/04/2015: Asmar is doing well. He he has seen [name] for the past 2-1/2 years. Is seen with wife, [name],   Had a cath in June, 2017.   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   Went to the ER in Sept with angina . Thought it may be indigestion.   Was taking NTG .     Imdur was increased . Seems to be improved at this point  Has some orthostatic hypotension because of the increased Imdur dosing .  Is on Ranexa 500 BID     Current Outpatient Prescriptions on File Prior to Visit  Medication Sig Dispense Refill  . acetaminophen (TYLENOL ARTHRITIS PAIN) 650 MG CR tablet Take 1,300 mg by mouth 2 (two) times daily as needed. For pain    . ALPRAZolam (XANAX) 0.5 MG tablet Take 0.5 mg by mouth every 6 (six) hours as needed for anxiety.    [name] amLODipine (NORVASC) 5 MG tablet Take 1 tablet (5 mg  total) by mouth 2 (two) times daily. 180 tablet 3  . aspirin 81 MG tablet Take 81 mg by mouth at bedtime.     [name] atenolol (TENORMIN) 50 MG tablet Take 1 tablet (50 mg total) by mouth daily. 90 tablet 1  . atorvastatin (LIPITOR) 80 MG tablet Take 40 mg by mouth daily.     . clopidogrel (PLAVIX) 75 MG tablet Take 1 tablet (75 mg total) by mouth daily with breakfast. 90 tablet 1  . gabapentin (NEURONTIN) 300 MG capsule Take 300 mg by mouth at bedtime.      . hydrochlorothiazide (HYDRODIURIL) 25 MG tablet Take 12.5 mg by mouth 3 (three) times a week. Monday, Wednesday and Friday    . INVOKANA 100 MG TABS tablet Take 300 mg by mouth daily before breakfast.   2  . irbesartan (AVAPRO) 300 MG tablet Take 0.5 tablets (150 mg total) by mouth daily. 90 tablet 3  . isosorbide mononitrate (IMDUR) 30 MG 24 hr tablet Take 1 tablet (30 mg total) by mouth 2 (two) times daily. 60 tablet 0  . metFORMIN (GLUCOPHAGE-XR) 500 MG 24 hr tablet Take 1,000 mg by mouth 2 (two) times daily.     . nitroGLYCERIN (  NITROSTAT) 0.4 MG SL tablet Place 1 tablet (0.4 mg total) under the tongue every 5 (five) minutes as needed. For chest pain 25 tablet 5  . pantoprazole (PROTONIX) 40 MG tablet Take 40 mg by mouth 2 (two) times daily.    . ranolazine (RANEXA) 500 MG 12 hr tablet Take 1 tablet (500 mg total) by mouth 2 (two) times daily. 180 tablet 1  . traMADol (ULTRAM) 50 MG tablet Take 50 mg by mouth daily as needed for moderate pain.      No current facility-administered medications on file prior to visit.     No Known Allergies  Past Medical History:  Diagnosis Date  . Arthritis    "lower back; right shoulder; left thumb; joints" (06/21/2013)  . Asthma    "not sure if this is true or not" (06/21/2013)  . CAD (coronary artery disease) May 2015   s/p PTCA/DES x 3 to mid LAD, mod non-ob disease in Cx and RCA May 2015; s/p repeat caths - last study in November of 2015 - stents patent, - FFR - managed medically.   [name] GERD  (gastroesophageal reflux disease)   . Gout    "maybe twice in my life"  . Headache(784.0)    "weekly for the last 3-4 months" (06/21/2013)  . Hyperlipidemia   . Hypertension   . Myocardial infarction    "/Dr. Katharina Caper I've had one between 2014-2015) (06/21/2013)  . OSA on CPAP   . Osteoarthritis   . Type II diabetes mellitus ([place])     Past Surgical History:  Procedure Laterality Date  . CARDIAC CATHETERIZATION  1980's   "once"  . CARDIAC CATHETERIZATION N/A 07/23/2015   Procedure: Left Heart Cath and Coronary Angiography;  Surgeon: Sherren Mocha, MD;  Location: [place] CV LAB;  Service: Cardiovascular;  Laterality: N/A;  . CARDIOVASCULAR STRESS TEST  06/10/2008   EF 68%  . CARPAL TUNNEL RELEASE Bilateral   . CORONARY ANGIOPLASTY WITH STENT PLACEMENT  06/21/2013   "3"  . EXCISION MORTON'S NEUROMA Left   . EYE SURGERY Left    "removed film over my eye"  . FRACTIONAL FLOW RESERVE WIRE N/A 06/26/2013   Procedure: FRACTIONAL FLOW RESERVE WIRE;  Surgeon: [name], MD;  Location: [place] [facility] CATH LAB;  Service: Cardiovascular;  Laterality: N/A;  . HERNIA REPAIR Left   . JOINT REPLACEMENT Left 03/2013   "thumb"  . LEFT HEART CATHETERIZATION WITH CORONARY ANGIOGRAM N/A 06/21/2013   Procedure: LEFT HEART CATHETERIZATION WITH CORONARY ANGIOGRAM;  Surgeon: Burnell Blanks, MD;  Location: [facility] CATH LAB;  Service: Cardiovascular;  Laterality: N/A;  . LEFT HEART CATHETERIZATION WITH CORONARY ANGIOGRAM N/A 06/26/2013   Procedure: LEFT HEART CATHETERIZATION WITH CORONARY ANGIOGRAM;  Surgeon: [name], MD;  Location: [place] CATH LAB;  Service: Cardiovascular;  Laterality: N/A;  . TONSILLECTOMY    . TOTAL SHOULDER ARTHROPLASTY  03/01/2012   Procedure: TOTAL SHOULDER ARTHROPLASTY;  Surgeon: Marin Shutter, MD;  Location: [place];  Service: Orthopedics;  Laterality: Right;  . UMBILICAL HERNIA REPAIR      History  Smoking Status  . Never Smoker  Smokeless Tobacco  . Never Used    History   Alcohol Use  . Yes    Family History  Problem Relation Age of Onset  . Diabetes type II Mother   . Heart attack Father   . Coronary artery disease Cousin   . Heart disease Brother     Reviw of Systems:  Reviewed in the HPI.  All other systems are negative.  Physical Exam: Blood pressure 120/70, pulse 61, height '5\' 9"'$  (1.753 m), weight 194 lb (88 kg). General: Well developed, well nourished, in no acute distress. Head: Normocephalic, atraumatic, sclera non-icteric, mucus membranes are moist,  Neck: Supple. Carotids are 2 + without bruits. No JVD Lungs: Clear bilaterally to auscultation. Heart: regular rate  With normal  S1 S2. No murmurs, gallops or rubs. Abdomen: Soft, non-tender, non-distended with normal bowel sounds. No hepatomegaly. No rebound/guarding. No masses. Msk:  Strength and tone are normal Extremities: No clubbing or cyanosis. No edema.  Distal pedal pulses are 2+ and equal bilaterally. Neuro: Alert and oriented X 3. Moves all extremities spontaneously. Psych:  Responds to questions appropriately with a normal affect.  ECG:  June 03, 2013:  NR at 34.  Small Q waves in inferior.   Assessment / Plan:   1. Coronary artery disease: [name] has known diffuse stenosis of his branch vessels. He will probably want up having coronary artery bypass grafting at some point. He's on Imdur 60 mg a day. He's tolerating this fairly well but does have occasional orthostatic hypotension. He is on Ranexa 500 mg twice a day. We will increase the Ranexa to 1000 g twice a day. He will return to see [name] in 3 months and we will discuss his case at that time. He may want up needing coronary artery bypass grafting.  2. Hyperlipidemia: Continue current dose of Lipitor

## 2015-12-04 NOTE — Patient Instructions (Addendum)
Medication Instructions:  INCREASE Ranexa to 1000 mg twice daily   Labwork: None Ordered   Testing/Procedures: None Ordered   Follow-Up: Your physician recommends that you return on 01/04/16 at 2:15 pm for EKG/Nurse visit  Your physician recommends that you keep your follow-up appointment in: 3 months with Truitt Merle, NP on 03/02/16 @ 8:00 am   If you need a refill on your cardiac medications before your next appointment, please call your pharmacy.   Thank you for choosing [facility]! Christen Bame, RN [phone]

## 2015-12-28 ENCOUNTER — Other Ambulatory Visit: Payer: Self-pay | Admitting: Nurse Practitioner

## 2015-12-28 DIAGNOSIS — M25512 Pain in left shoulder: Secondary | ICD-10-CM | POA: Diagnosis not present

## 2016-01-04 ENCOUNTER — Ambulatory Visit (INDEPENDENT_AMBULATORY_CARE_PROVIDER_SITE_OTHER): Payer: PPO

## 2016-01-04 DIAGNOSIS — I10 Essential (primary) hypertension: Secondary | ICD-10-CM

## 2016-01-04 DIAGNOSIS — I2510 Atherosclerotic heart disease of native coronary artery without angina pectoris: Secondary | ICD-10-CM

## 2016-01-04 NOTE — Progress Notes (Signed)
Nurse visit was scheduled to follow-up on pt taking an increased dose of Ranexa 1,'000mg'$  twice a day. The pt said he took this dosage for 1 week and did not feel well.  The pt went back to '500mg'$  twice a day and has not had any symptoms on this dosage.  I spoke with Dr Acie Fredrickson and nurse visit is not needed. I will update the pt's medication list.

## 2016-01-26 DIAGNOSIS — I10 Essential (primary) hypertension: Secondary | ICD-10-CM | POA: Diagnosis not present

## 2016-01-26 DIAGNOSIS — Z6829 Body mass index (BMI) 29.0-29.9, adult: Secondary | ICD-10-CM | POA: Diagnosis not present

## 2016-01-26 DIAGNOSIS — E119 Type 2 diabetes mellitus without complications: Secondary | ICD-10-CM | POA: Diagnosis not present

## 2016-01-26 DIAGNOSIS — I208 Other forms of angina pectoris: Secondary | ICD-10-CM | POA: Diagnosis not present

## 2016-01-26 DIAGNOSIS — E784 Other hyperlipidemia: Secondary | ICD-10-CM | POA: Diagnosis not present

## 2016-03-01 ENCOUNTER — Telehealth: Payer: Self-pay | Admitting: *Deleted

## 2016-03-01 NOTE — Telephone Encounter (Signed)
S/w wife per DPR is aware moved pt due to inclement weather.

## 2016-03-02 ENCOUNTER — Ambulatory Visit: Payer: PPO | Admitting: Nurse Practitioner

## 2016-03-04 ENCOUNTER — Other Ambulatory Visit: Payer: Self-pay | Admitting: Nurse Practitioner

## 2016-03-09 ENCOUNTER — Ambulatory Visit (INDEPENDENT_AMBULATORY_CARE_PROVIDER_SITE_OTHER): Payer: PPO | Admitting: Nurse Practitioner

## 2016-03-09 ENCOUNTER — Encounter: Payer: Self-pay | Admitting: Nurse Practitioner

## 2016-03-09 VITALS — BP 150/88 | HR 59 | Ht 69.0 in | Wt 197.4 lb

## 2016-03-09 DIAGNOSIS — E7800 Pure hypercholesterolemia, unspecified: Secondary | ICD-10-CM | POA: Diagnosis not present

## 2016-03-09 DIAGNOSIS — I2510 Atherosclerotic heart disease of native coronary artery without angina pectoris: Secondary | ICD-10-CM | POA: Diagnosis not present

## 2016-03-09 DIAGNOSIS — I10 Essential (primary) hypertension: Secondary | ICD-10-CM

## 2016-03-09 DIAGNOSIS — I259 Chronic ischemic heart disease, unspecified: Secondary | ICD-10-CM | POA: Diagnosis not present

## 2016-03-09 MED ORDER — RANOLAZINE ER 500 MG PO TB12: 500.0000 mg | ORAL_TABLET | Freq: Two times a day (BID) | ORAL | 6 refills | Status: DC

## 2016-03-09 NOTE — Progress Notes (Signed)
CARDIOLOGY OFFICE NOTE  Date:  03/09/2016    Hunter Mcbride Date of Birth: 1945/07/05 Medical Record #[id number]  PCP:  Hunter Ly, MD  Cardiologist:  Hunter Mcbride    Chief Complaint  Patient presents with  . Coronary Artery Disease    3 month check - seen for Dr. Acie Mcbride    History of Present Illness: Hunter Mcbride is a 71 y.o. male who presents today for a follow up visit. This is a 3 month check. Seen for Dr. Acie Mcbride. His wife is a former patient of Dr. Susa Mcbride that I have taken care of.   He has a history of CAD, HTN, HLD, DM and OSA.   He presented back in the spring of 2015 with a 2 month history of exertional chest pain - had abnormal Myoview - then cathed and had DES x 3 to the LAD. Otherwise had moderate but nonobstructive disease With a normal EF of 55 to 60%. Dscharged on Plavix. After his initial event - he went back to the ER with chest pain and was recathed - to continue with medical management. He has not tolerated Car active NTG in the past despite several attempts.   In November of 2015 - he went hunting with his son out of state. He had been cutting trees and setting up stands. Developed SSCP that initially resolved but then returned, subsequently associated with SOB and nausea - ended up going to the hospital in [place] (after first presenting to an urgent care like facility that did not offer cardiac cath) - was recathed - actually twice. Had normal left heart pressures, patent stents extending from the proximal to the distal LAD and severe diffuse disease involving the branches of all 3 major epicardial vessels. No clear culprit lesion to intervene on - then had Myoview which showed normal perfusion and normal EF at 59%. Was recathed and had FFR of the first OM, posterior descending and distal RCA which were nonsignificant - no PCI was performed. CXR was normal. Echo was also updated - normal EF - mildly dilated aorta noted with mild LVH. I did not  get a discharge summary - they brought a RX in for 1000 mg of Ranexa - this was not filled.  I saw him back after his return from [place]. He reiterated his story again about what happened to him in [place]. He did probably over exert him self - had walked 6 1/2 miles in the process of putting up the stands/cutting trees, etc. Had pretty classic chest pain associated with dyspnea and feeling of "impending doom". He notes that his symptoms seemed to abate after the second cath. He did take the higher dose of Ranexa but had some side effects (low BP, couldn't think, stomach upset, dizziness) and cut the dose back - he has remained on this dose.   Since that trip - he had had some fleeting pain - he did bring his cath CDs and these were reviewed by Dr. Burt Mcbride. He agreed with continued medical therapy. Films were loaded in the [facility] system.   In the hospital back in January of 2017 with angina - low risk Myoview noted. Continued on medical management.  He has continued to have some episodes of chest pain. Rechallenged him with Imdur. Has not wanted to increase Ranexa due to cost.  When seen back in June of 2017 - he was not doing well - having more angina - ended up getting him  recathed. Medical management continued. Last seen by me back in September - saw Dr. Acie Mcbride in October - tried again to increase his Ranexa but was not able to tolerate - again. He thought that at some point, [place]aley would need to have bypass surgery.   Comes back today. Here with Hunter Mcbride, his wife. Has only had one little bout of angina back in October - other than that - he feels great. He was shoveling snow last week and did fine. He feels like he has more energy. His biggest issue is the cost of Ranexa - asking about stopping and "seeing what happens". BP much better at home. Still with issues with his blood sugar - may be going on Hunter Mcbride.   Past Medical History:  Diagnosis Date  . Arthritis    "lower back; right shoulder;  left thumb; joints" (06/21/2013)  . Asthma    "not sure if this is true or not" (06/21/2013)  . CAD (coronary artery disease) May 2015   s/p PTCA/DES x 3 to mid LAD, mod non-ob disease in Cx and RCA May 2015; s/p repeat caths - last study in November of 2015 - stents patent, - FFR - managed medically.   [name] GERD (gastroesophageal reflux disease)   . Gout    "maybe twice in my life"  . Headache(784.0)    "weekly for the last 3-4 months" (06/21/2013)  . Hyperlipidemia   . Hypertension   . Myocardial infarction    "/Dr. Katharina Caper I've had one between 2014-2015) (06/21/2013)  . OSA on CPAP   . Osteoarthritis   . Type II diabetes mellitus (Hunter Mcbride)     Past Surgical History:  Procedure Laterality Date  . CARDIAC CATHETERIZATION  1980's   "once"  . CARDIAC CATHETERIZATION N/A 07/23/2015   Procedure: Left Heart Cath and Coronary Angiography;  Surgeon: Hunter Mocha, MD;  Location: [facility];  Service: Cardiovascular;  Laterality: N/A;  . CARDIOVASCULAR STRESS TEST  06/10/2008   EF 68%  . CARPAL TUNNEL RELEASE Bilateral   . CORONARY ANGIOPLASTY WITH STENT PLACEMENT  06/21/2013   "3"  . EXCISION MORTON'S NEUROMA Left   . EYE SURGERY Left    "removed film over my eye"  . FRACTIONAL FLOW RESERVE WIRE N/A 06/26/2013   Procedure: FRACTIONAL FLOW RESERVE WIRE;  Surgeon: Hunter Hampshire, MD;  Location: [facility];  Service: Cardiovascular;  Laterality: N/A;  . HERNIA REPAIR Left   . JOINT REPLACEMENT Left 03/2013   "thumb"  . LEFT HEART CATHETERIZATION WITH CORONARY ANGIOGRAM N/A 06/21/2013   Procedure: LEFT HEART CATHETERIZATION WITH CORONARY ANGIOGRAM;  Surgeon: Hunter Blanks, MD;  Location: [facility];  Service: Cardiovascular;  Laterality: N/A;  . LEFT HEART CATHETERIZATION WITH CORONARY ANGIOGRAM N/A 06/26/2013   Procedure: LEFT HEART CATHETERIZATION WITH CORONARY ANGIOGRAM;  Surgeon: Hunter Hampshire, MD;  Location: [facility];  Service: Cardiovascular;  Laterality: N/A;  . TONSILLECTOMY     . TOTAL SHOULDER ARTHROPLASTY  03/01/2012   Procedure: TOTAL SHOULDER ARTHROPLASTY;  Surgeon: Hunter Shutter, MD;  Location: [facility];  Service: Orthopedics;  Laterality: Right;  . UMBILICAL HERNIA REPAIR       Medications: Current Outpatient Prescriptions  Medication Sig Dispense Refill  . acetaminophen (TYLENOL ARTHRITIS PAIN) 650 MG CR tablet Take 1,300 mg by mouth 2 (two) times daily as needed. For pain    . ALPRAZolam (XANAX) 0.5 MG tablet Take 0.5 mg by mouth every 6 (six) hours as needed for anxiety.    [name]  amLODipine (NORVASC) 5 MG tablet Take 1 tablet (5 mg total) by mouth 2 (two) times daily. 180 tablet 3  . aspirin 81 MG tablet Take 81 mg by mouth at bedtime.     [name] atenolol (TENORMIN) 50 MG tablet Take 1 tablet (50 mg total) by mouth daily. 90 tablet 1  . atorvastatin (LIPITOR) 80 MG tablet Take 40 mg by mouth daily.     . clopidogrel (PLAVIX) 75 MG tablet Take 1 tablet (75 mg total) by mouth daily with breakfast. 90 tablet 1  . gabapentin (NEURONTIN) 300 MG capsule Take 300 mg by mouth at bedtime.      . hydrochlorothiazide (HYDRODIURIL) 25 MG tablet Take 12.5 mg by mouth 3 (three) times a week. Monday, Wednesday and Friday    . INVOKANA 100 MG TABS tablet Take 300 mg by mouth daily before breakfast.   2  . irbesartan (AVAPRO) 300 MG tablet Take 0.5 tablets (150 mg total) by mouth daily. 90 tablet 3  . isosorbide mononitrate (IMDUR) 30 MG 24 hr tablet TAKE 1 TABLET BY MOUTH TWICE (2) DAILY 180 tablet 1  . metFORMIN (GLUCOPHAGE-XR) 500 MG 24 hr tablet Take 1,000 mg by mouth 2 (two) times daily.     . nitroGLYCERIN (NITROSTAT) 0.4 MG SL tablet Place 1 tablet (0.4 mg total) under the tongue every 5 (five) minutes as needed. For chest pain 25 tablet 5  . pantoprazole (PROTONIX) 40 MG tablet Take 40 mg by mouth 2 (two) times daily.    . ranolazine (RANEXA) 500 MG 12 hr tablet Take 1 tablet (500 mg total) by mouth 2 (two) times daily. 60 tablet 6  . traMADol (ULTRAM) 50 MG tablet Take 50  mg by mouth daily as needed for moderate pain.      No current facility-administered medications for this visit.     Allergies: No Known Allergies  Social History: The patient  reports that he has never smoked. He has never used smokeless tobacco. He reports that he drinks alcohol. He reports that he does not use drugs.   Family History: The patient's family history includes Coronary artery disease in his cousin; Diabetes type II in his mother; Heart attack in his father; Heart disease in his brother.   Review of Systems: Please see the history of present illness.   Otherwise, the review of systems is positive for none.   All other systems are reviewed and negative.   Physical Exam: VS:  BP (!) 150/88   Pulse (!) 59   Ht '5\' 9"'$  (1.753 m)   Wt 197 lb 6.4 oz (89.5 kg)   SpO2 98% Comment: at rest  BMI 29.15 kg/m  .  BMI Body mass index is 29.15 kg/m.  Wt Readings from Last 3 Encounters:  03/09/16 197 lb 6.4 oz (89.5 kg)  12/04/15 194 lb (88 kg)  11/03/15 193 lb 6.4 oz (87.7 kg)    General: Pleasant. Well developed, well nourished and in no acute distress.   HEENT: Normal.  Neck: Supple, no JVD, carotid bruits, or masses noted.  Cardiac: Regular rate and rhythm. No murmurs, rubs, or gallops. No edema.  Respiratory:  Lungs are clear to auscultation bilaterally with normal work of breathing.  GI: Soft and nontender.  MS: No deformity or atrophy. Gait and ROM intact.  Skin: Warm and dry. Color is normal.  Neuro:  Strength and sensation are intact and no gross focal deficits noted.  Psych: Alert, appropriate and with normal affect.   LABORATORY  DATA:  EKG:  EKG is not ordered today.  Lab Results  Component Value Date   WBC 4.9 11/12/2015   HGB 14.8 11/12/2015   HCT 43.4 11/12/2015   PLT 147 (L) 11/12/2015   GLUCOSE 279 (H) 11/12/2015   CHOL 123 04/11/2011   TRIG 119.0 04/11/2011   HDL 41.60 04/11/2011   LDLCALC 58 04/11/2011   ALT 28 06/27/2013   AST 17 06/27/2013     NA 137 11/12/2015   K 4.0 11/12/2015   CL 105 11/12/2015   CREATININE 1.04 11/12/2015   BUN 17 11/12/2015   CO2 23 11/12/2015   INR 1.04 11/12/2015   HGBA1C 8.4 (H) 06/26/2013    BNP (last 3 results) No results for input(s): BNP in the last 8760 hours.  ProBNP (last 3 results) No results for input(s): PROBNP in the last 8760 hours.   Other Studies Reviewed Today:  Procedures   Left Heart Cath and Coronary Angiography 07/2015  Conclusion   1. The left ventricular systolic function is normal. 2. Prox RCA to Mid RCA lesion, 40% stenosed. 3. Mid RCA lesion, 40% stenosed. 4. Ost RPDA to RPDA lesion, 70% stenosed. 5. Dist RCA lesion, 70% stenosed. 6. RPDA lesion, 75% stenosed. 7. Ost LM lesion, 40% stenosed. 8. 1st Mrg lesion, 60% stenosed. 9. Mid Cx lesion, 70% stenosed. 10. Prox LAD to Dist LAD lesion, 10% stenosed. The lesion was previously treated with a stent (unknown type). 11. Dist LAD lesion, 40% stenosed. 12. 2nd Diag lesion, 80% stenosed. 13. 1st Diag lesion, 80% stenosed.  1. Continued patency of the stented segment in the LAD 2. Severe diffuse small vessel CAD with moderate-severe stenosis of the PDA/PLA bifurcation and severe stenosis of the diagonal branches 3. Moderate LCx stenosis 4. Normal LV function  Recommend:  Films reviewed. No appreciable change from 2015 studies.  Titrate anti-anginal Rx as tolerated  FFR of the LCx and RCA was performed in 2016 with negative findings  Exercise stress test to evaluate for ischemia on maximal medical therapy    GXT Study Highlights 07/2015  Patient presents today for routine GXT. Seen for Dr. Burt Mcbride. Has had recent cardiac cath - GXT to rule out ischemia. He has done well post cath. No further angina. On higher dose of Norvasc. Reports better BP control.   Resting BP is 150/84 Target HR is 129  Today the patient exercised on the standard Bruce protocol for a total of 11 minutes.  Excellent  exercise tolerance.  Mildly hypertensive blood pressure response (atenolol held today).   Max HR is 129 Max BP is 238/90  Clinically negative for chest pain. Test was stopped due to patient request and achievement of target HR.  EKG negative for ischemia. No significant arrhythmia noted. Rare PVC noted  Recommendations: Reviewed with Dr. Burt Mcbride. Will continue with current medical management which includes the higher dose of Norvasc See back in 3 months.   Burtis Junes, RN, ANP-C     Myoview stress test from 02/2015: -No T wave inversion was noted during stress. -There was no ST segment deviation noted during stress. -The study is normal. There is no ischemia. -This is a low risk study. -Nuclear stress EF: 62%.  Assessment/Plan:  1. CAD with history of chronic angina - last cath from June of 2017 noted - negative GXT for ischemia following that study - he will continue with medical management - I think he will have some spells of angina on occasion - especially when he overexerts.  I have left him on his current regimen for now. He has actually done better over the past couple of months - I have asked him to continue with his current regimen. RX given for Ranexa for them to "shop around" and cost compare.   2. HTN - BP better at tome. No change with current regimen. They will continue to monitor.  He admits he is a little more "excited" today because of the discussion of money and medicines.   3. HLD - on statin - labs followed by PCP.  4. DM - followed by Dr. Joylene Draft.     5. Mildly dilated aortic root/ascending aorta - not seen on our last echo from May 2015 - would follow for now.   Current medicines are reviewed with the patient today.  The patient does not have concerns regarding medicines other than what has been noted above.  The following changes have been made:  See above.  Labs/ tests ordered today include:   No orders of the defined types were placed  in this encounter.    Disposition:   FU with me in 4 months.   Patient is agreeable to this plan and will call if any problems develop in the interim.   SignedTruitt Merle, NP  03/09/2016 8:40 AM  [facility] [address] [place], [place]  [address] Phone: [phone] Fax: [phone]

## 2016-03-09 NOTE — Patient Instructions (Addendum)
We will be checking the following labs today - NONE   Medication Instructions:    Continue with your current medicines.     Testing/Procedures To Be Arranged:  N/A  Follow-Up:   See me in 4 months    Other Special Instructions:   N/A    If you need a refill on your cardiac medications before your next appointment, please call your pharmacy.   Call the [facility] office at [phone] if you have any questions, problems or concerns.      

## 2016-03-24 DIAGNOSIS — M1611 Unilateral primary osteoarthritis, right hip: Secondary | ICD-10-CM | POA: Diagnosis not present

## 2016-03-24 DIAGNOSIS — M25551 Pain in right hip: Secondary | ICD-10-CM | POA: Diagnosis not present

## 2016-03-29 ENCOUNTER — Telehealth: Payer: Self-pay | Admitting: Nurse Practitioner

## 2016-03-29 NOTE — Telephone Encounter (Signed)
Walk In pt form- [facility]-clearance dropped off-placed in doc box.

## 2016-04-04 ENCOUNTER — Other Ambulatory Visit (HOSPITAL_COMMUNITY): Payer: Self-pay | Admitting: Emergency Medicine

## 2016-04-04 NOTE — Patient Instructions (Addendum)
[name]  04/04/2016   Your procedure is scheduled on: 04-12-16  Report to [facility] Main  Entrance take [facility]  elevators to 3rd floor to  [place] at 830AM.  Call this number if you have problems the morning of surgery [phone]   Remember: ONLY 1 PERSON MAY GO WITH YOU TO SHORT STAY TO GET  READY MORNING OF [place].  Do not eat food or drink liquids :After Midnight.     Take these medicines the morning of surgery with A SIP OF WATER: tylenol as needed, amlodipine(norvasc), Imdur, pantoprazole(protonix), ranolazine(ranexa)   DO NOT TAKE ANY DIABETIC MEDICATIONS DAY OF YOUR SURGERY                               You may not have any metal on your body including hair pins and              piercings  Do not wear jewelry, make-up, lotions, powders or perfumes, deodorant             Do not wear nail polish.  Do not shave  48 hours prior to surgery.              Men may shave face and neck.   Do not bring valuables to the hospital. [place].  Contacts, dentures or bridgework may not be worn into surgery.  Leave suitcase in the car. After surgery it may be brought to your room.               Please read over the following fact sheets you were given: _____________________________________________________________________             How to Manage Your Diabetes Before and After Surgery  Why is it important to control my blood sugar before and after surgery? . Improving blood sugar levels before and after surgery helps healing and can limit problems. . A way of improving blood sugar control is eating a healthy diet by: o  Eating less sugar and carbohydrates o  Increasing activity/exercise o  Talking with your doctor about reaching your blood sugar goals . High blood sugars (greater than 180 mg/dL) can raise your risk of infections and slow your recovery, so you will need to focus on  controlling your diabetes during the weeks before surgery. . Make sure that the doctor who takes care of your diabetes knows about your planned surgery including the date and location.  How do I manage my blood sugar before surgery? . Check your blood sugar at least 4 times a day, starting 2 days before surgery, to make sure that the level is not too high or low. o Check your blood sugar the morning of your surgery when you wake up and every 2 hours until you get to the Short Stay unit. . If your blood sugar is less than 70 mg/dL, you will need to treat for low blood sugar: o Do not take insulin. o Treat a low blood sugar (less than 70 mg/dL) with  cup of clear juice (cranberry or apple), 4 glucose tablets, OR glucose gel. o Recheck blood sugar in 15 minutes after treatment (to make sure it is greater than 70 mg/dL). If  your blood sugar is not greater than 70 mg/dL on recheck, call [phone] for further instructions. . Report your blood sugar to the short stay nurse when you get to Short Stay.  . If you are admitted to the hospital after surgery: o Your blood sugar will be checked by the staff and you will probably be given insulin after surgery (instead of oral diabetes medicines) to make sure you have good blood sugar levels. o The goal for blood sugar control after surgery is 80-180 mg/dL.   WHAT DO I DO ABOUT MY DIABETES MEDICATION?  [name] Do not take oral diabetes medicines (pills) the morning of surgery.  . THE DAY BEFORE SURGERY on 04-11-16, take your Invokana and Metformin as usual.      . THE MORNING OF SURGERY ON 04-12-16, DO NOT TAKE ANY INVOKANA OR METFORMIN!    Reviewed and Endorsed by [facility] Patient Education Committee, August 2015   [facility] - Preparing for Surgery Before surgery, you can play an important role.  Because skin is not sterile, your skin needs to be as free of germs as possible.  You can reduce the number of germs on your skin by washing with CHG  (chlorahexidine gluconate) soap before surgery.  CHG is an antiseptic cleaner which kills germs and bonds with the skin to continue killing germs even after washing. Please DO NOT use if you have an allergy to CHG or antibacterial soaps.  If your skin becomes reddened/irritated stop using the CHG and inform your nurse when you arrive at Short Stay. Do not shave (including legs and underarms) for at least 48 hours prior to the first CHG shower.  You may shave your face/neck. Please follow these instructions carefully:  1.  Shower with CHG Soap the night before surgery and the  morning of Surgery.  2.  If you choose to wash your hair, wash your hair first as usual with your  normal  shampoo.  3.  After you shampoo, rinse your hair and body thoroughly to remove the  shampoo.                           4.  Use CHG as you would any other liquid soap.  You can apply chg directly  to the skin and wash                       Gently with a scrungie or clean washcloth.  5.  Apply the CHG Soap to your body ONLY FROM THE NECK DOWN.   Do not use on face/ open                           Wound or open sores. Avoid contact with eyes, ears mouth and genitals (private parts).                       Wash face,  Genitals (private parts) with your normal soap.             6.  Wash thoroughly, paying special attention to the area where your surgery  will be performed.  7.  Thoroughly rinse your body with warm water from the neck down.  8.  DO NOT shower/wash with your normal soap after using and rinsing off  the CHG Soap.  9.  Pat yourself dry with a clean towel.            10.  Wear clean pajamas.            11.  Place clean sheets on your bed the night of your first shower and do not  sleep with pets. Day of Surgery : Do not apply any lotions/deodorants the morning of surgery.  Please wear clean clothes to the hospital/surgery center.  FAILURE TO FOLLOW THESE INSTRUCTIONS MAY RESULT IN THE CANCELLATION OF  YOUR SURGERY PATIENT SIGNATURE_________________________________  NURSE SIGNATURE__________________________________  ________________________________________________________________________   Adam Phenix  An incentive spirometer is a tool that can help keep your lungs clear and active. This tool measures how well you are filling your lungs with each breath. Taking [place] deep breaths may help reverse or decrease the chance of developing breathing (pulmonary) problems (especially infection) following:  A [place] period of time when you are unable to move or be active. BEFORE THE PROCEDURE   If the spirometer includes an indicator to show your best effort, your nurse or respiratory therapist will set it to a desired goal.  If possible, sit up straight or lean slightly forward. Try not to slouch.  Hold the incentive spirometer in an upright position. INSTRUCTIONS FOR USE  1. Sit on the edge of your bed if possible, or sit up as far as you can in bed or on a chair. 2. Hold the incentive spirometer in an upright position. 3. Breathe out normally. 4. Place the mouthpiece in your mouth and seal your lips tightly around it. 5. Breathe in slowly and as deeply as possible, raising the piston or the ball toward the top of the column. 6. Hold your breath for 3-5 seconds or for as [place] as possible. Allow the piston or ball to fall to the bottom of the column. 7. Remove the mouthpiece from your mouth and breathe out normally. 8. Rest for a few seconds and repeat Steps 1 through 7 at least 10 times every 1-2 hours when you are awake. Take your time and take a few normal breaths between deep breaths. 9. The spirometer may include an indicator to show your best effort. Use the indicator as a goal to work toward during each repetition. 10. After each set of 10 deep breaths, practice coughing to be sure your lungs are clear. If you have an incision (the cut made at the time of surgery), support your  incision when coughing by placing a pillow or rolled up towels firmly against it. Once you are able to get out of bed, walk around indoors and cough well. You may stop using the incentive spirometer when instructed by your caregiver.  RISKS AND COMPLICATIONS  Take your time so you do not get dizzy or light-headed.  If you are in pain, you may need to take or ask for pain medication before doing incentive spirometry. It is harder to take a deep breath if you are having pain. AFTER USE  Rest and breathe slowly and easily.  It can be helpful to keep track of a log of your progress. Your caregiver can provide you with a simple table to help with this. If you are using the spirometer at home, follow these instructions: [place] IF:   You are having difficultly using the spirometer.  You have trouble using the spirometer as often as instructed.  Your pain medication is not giving enough relief while using the spirometer.  You  develop fever of 100.5 F (38.1 C) or higher. SEEK IMMEDIATE MEDICAL CARE IF:   You cough up bloody sputum that had not been present before.  You develop fever of 102 F (38.9 C) or greater.  You develop worsening pain at or near the incision site. MAKE SURE YOU:   Understand these instructions.  Will watch your condition.  Will get help right away if you are not doing well or get worse. Document Released: 06/13/2006 Document Revised: 04/25/2011 Document Reviewed: 08/14/2006 ExitCare Patient Information 2014 ExitCare, [place].   ________________________________________________________________________  WHAT IS A BLOOD TRANSFUSION? Blood Transfusion Information  A transfusion is the replacement of blood or some of its parts. Blood is made up of multiple cells which provide different functions.  Red blood cells carry oxygen and are used for blood loss replacement.  White blood cells fight against infection.  Platelets control bleeding.  Plasma  helps clot blood.  Other blood products are available for specialized needs, such as hemophilia or other clotting disorders. BEFORE THE TRANSFUSION  Who gives blood for transfusions?   Healthy volunteers who are fully evaluated to make sure their blood is safe. This is blood bank blood. Transfusion therapy is the safest it has ever been in the practice of medicine. Before blood is taken from a donor, a complete history is taken to make sure that person has no history of diseases nor engages in risky social behavior (examples are intravenous drug use or sexual activity with multiple partners). The donor's travel history is screened to minimize risk of transmitting infections, such as malaria. The donated blood is tested for signs of infectious diseases, such as HIV and hepatitis. The blood is then tested to be sure it is compatible with you in order to minimize the chance of a transfusion reaction. If you or a relative donates blood, this is often done in anticipation of surgery and is not appropriate for emergency situations. It takes many days to process the donated blood. RISKS AND COMPLICATIONS Although transfusion therapy is very safe and saves many lives, the main dangers of transfusion include:   Getting an infectious disease.  Developing a transfusion reaction. This is an allergic reaction to something in the blood you were given. Every precaution is taken to prevent this. The decision to have a blood transfusion has been considered carefully by your caregiver before blood is given. Blood is not given unless the benefits outweigh the risks. AFTER THE TRANSFUSION  Right after receiving a blood transfusion, you will usually feel much better and more energetic. This is especially true if your red blood cells have gotten low (anemic). The transfusion raises the level of the red blood cells which carry oxygen, and this usually causes an energy increase.  The nurse administering the transfusion  will monitor you carefully for complications. HOME CARE INSTRUCTIONS  No special instructions are needed after a transfusion. You may find your energy is better. Speak with your caregiver about any limitations on activity for underlying diseases you may have. SEEK MEDICAL CARE IF:   Your condition is not improving after your transfusion.  You develop redness or irritation at the intravenous (IV) site. SEEK IMMEDIATE MEDICAL CARE IF:  Any of the following symptoms occur over the next 12 hours:  Shaking chills.  You have a temperature by mouth above 102 F (38.9 C), not controlled by medicine.  Chest, back, or muscle pain.  People around you feel you are not acting correctly or are confused.  Shortness of  breath or difficulty breathing.  Dizziness and fainting.  You get a rash or develop hives.  You have a decrease in urine output.  Your urine turns a dark color or changes to pink, red, or brown. Any of the following symptoms occur over the next 10 days:  You have a temperature by mouth above 102 F (38.9 C), not controlled by medicine.  Shortness of breath.  Weakness after normal activity.  The white part of the eye turns yellow (jaundice).  You have a decrease in the amount of urine or are urinating less often.  Your urine turns a dark color or changes to pink, red, or brown. Document Released: 01/29/2000 Document Revised: 04/25/2011 Document Reviewed: 09/17/2007 [facility] Patient Information 2014 [place], [place].  _______________________________________________________________________

## 2016-04-04 NOTE — H&P (Signed)
TOTAL HIP ADMISSION H&P  Patient is admitted for right total hip arthroplasty, anterior approach.  Subjective:  Chief Complaint:    Right hip primary OA / pain  HPI: Hunter Mcbride., 71 y.o. male, has a history of pain and functional disability in the right hip(s) due to arthritis and patient has failed non-surgical conservative treatments for greater than 12 weeks to include NSAID's and/or analgesics, corticosteriod injections and activity modification.  Onset of symptoms was gradual starting 1+ years ago with gradually worsening course since that time.The patient noted no past surgery on the right hip(s).  Patient currently rates pain in the right hip at 9 out of 10 with activity. Patient has night pain, worsening of pain with activity and weight bearing, trendelenberg gait, pain that interfers with activities of daily living and pain with passive range of motion. Patient has evidence of periarticular osteophytes and joint space narrowing by imaging studies. This condition presents safety issues increasing the risk of falls.  There is no current active infection.   Risks, benefits and expectations were discussed with the patient.  Risks including but not limited to the risk of anesthesia, blood clots, nerve damage, blood vessel damage, failure of the prosthesis, infection and up to and including death.  Patient understand the risks, benefits and expectations and wishes to proceed with surgery.   PCP: Jerlyn Ly, MD  D/C Plans:       Home no PT  Post-op Meds:       No Rx given   Tranexamic Acid:      To be given - IV   Decadron:      Is to be given  FYI:     Plavix & ASA  Norco  PT:  No PT   DME: Rx given for RW and 3-n-1   Patient Active Problem List   Diagnosis Date Noted  . Angina at rest [facility]) 02/25/2015  . Type 2 diabetes mellitus treated without insulin ([place])   . Coronary atherosclerosis of native coronary artery 06/27/2013  . Unstable angina ([place]) 06/21/2013  . Chest  pain 06/20/2013  . Shoulder arthritis 03/02/2012  . Hypertension 04/11/2011  . Hyperlipidemia 04/11/2011  . Diabetes mellitus ([place]) 04/11/2011  . Chest tightness 10/13/2010   Past Medical History:  Diagnosis Date  . Arthritis    "lower back; right shoulder; left thumb; joints" (06/21/2013)  . Asthma    "not sure if this is true or not" (06/21/2013)  . CAD (coronary artery disease) May 2015   s/p PTCA/DES x 3 to mid LAD, mod non-ob disease in Cx and RCA May 2015; s/p repeat caths - last study in November of 2015 - stents patent, - FFR - managed medically.   [name] GERD (gastroesophageal reflux disease)   . Gout    "maybe twice in my life"  . Headache(784.0)    "weekly for the last 3-4 months" (06/21/2013)  . Hyperlipidemia   . Hypertension   . Myocardial infarction    "/Dr. Katharina Caper I've had one between 2014-2015) (06/21/2013)  . OSA on CPAP   . Osteoarthritis   . Type II diabetes mellitus ([place])     Past Surgical History:  Procedure Laterality Date  . CARDIAC CATHETERIZATION  1980's   "once"  . CARDIAC CATHETERIZATION N/A 07/23/2015   Procedure: Left Heart Cath and Coronary Angiography;  Surgeon: Sherren Mocha, MD;  Location: [place] CV LAB;  Service: Cardiovascular;  Laterality: N/A;  . CARDIOVASCULAR STRESS TEST  06/10/2008   EF  68%  . CARPAL TUNNEL RELEASE Bilateral   . CORONARY ANGIOPLASTY WITH STENT PLACEMENT  06/21/2013   "3"  . EXCISION MORTON'S NEUROMA Left   . EYE SURGERY Left    "removed film over my eye"  . FRACTIONAL FLOW RESERVE WIRE N/A 06/26/2013   Procedure: FRACTIONAL FLOW RESERVE WIRE;  Surgeon: [name], MD;  Location: [place] CATH LAB;  Service: Cardiovascular;  Laterality: N/A;  . HERNIA REPAIR Left   . JOINT REPLACEMENT Left 03/2013   "thumb"  . LEFT HEART CATHETERIZATION WITH CORONARY ANGIOGRAM N/A 06/21/2013   Procedure: LEFT HEART CATHETERIZATION WITH CORONARY ANGIOGRAM;  Surgeon: Burnell Blanks, MD;  Location: [facility] CATH LAB;  Service: Cardiovascular;   Laterality: N/A;  . LEFT HEART CATHETERIZATION WITH CORONARY ANGIOGRAM N/A 06/26/2013   Procedure: LEFT HEART CATHETERIZATION WITH CORONARY ANGIOGRAM;  Surgeon: [name], MD;  Location: [place] CATH LAB;  Service: Cardiovascular;  Laterality: N/A;  . TONSILLECTOMY    . TOTAL SHOULDER ARTHROPLASTY  03/01/2012   Procedure: TOTAL SHOULDER ARTHROPLASTY;  Surgeon: Marin Shutter, MD;  Location: [place];  Service: Orthopedics;  Laterality: Right;  . UMBILICAL HERNIA REPAIR      No prescriptions prior to admission.   Allergies  Allergen Reactions  . Vicodin [Hydrocodone-Acetaminophen] Nausea And Vomiting    Social History  Substance Use Topics  . Smoking status: Never Smoker  . Smokeless tobacco: Never Used  . Alcohol use Yes    Family History  Problem Relation Age of Onset  . Diabetes type II Mother   . Heart attack Father   . Coronary artery disease Cousin   . Heart disease Brother      Review of Systems  Constitutional: Negative.   HENT: Negative.   Eyes: Negative.   Respiratory: Negative.   Cardiovascular: Negative.   Gastrointestinal: Positive for constipation and heartburn.  Genitourinary: Negative.   Musculoskeletal: Positive for back pain and joint pain.  Skin: Negative.   Neurological: Negative.   Endo/Heme/Allergies: Negative.   Psychiatric/Behavioral: Negative.     Objective:  Physical Exam  Constitutional: He is oriented to person, place, and time. He appears well-developed.  HENT:  Head: Normocephalic.  Eyes: Pupils are equal, round, and reactive to light.  Neck: Neck supple. No JVD present. No tracheal deviation present. No thyromegaly present.  Cardiovascular: Normal rate, regular rhythm, normal heart sounds and intact distal pulses.   Respiratory: Effort normal and breath sounds normal. No stridor. No respiratory distress. He has no wheezes.  GI: Soft. There is no tenderness. There is no guarding.  Musculoskeletal:       Right hip: He exhibits decreased  range of motion, decreased strength, tenderness and bony tenderness. He exhibits no swelling, no crepitus and no laceration.  Lymphadenopathy:    He has no cervical adenopathy.  Neurological: He is alert and oriented to person, place, and time.  Skin: Skin is warm and dry.  Psychiatric: He has a normal mood and affect.     Labs:  Estimated body mass index is 29.15 kg/m as calculated from the following:   Height as of 03/09/16: '5\' 9"'$  (1.753 m).   Weight as of 03/09/16: 89.5 kg (197 lb 6.4 oz).   Imaging Review Plain radiographs demonstrate severe degenerative joint disease of the right hip(s). The bone quality appears to be good for age and reported activity level.  Assessment/Plan:  End stage arthritis, right hip(s)  The patient history, physical examination, clinical judgement of the provider and imaging studies are  consistent with end stage degenerative joint disease of the right hip(s) and total hip arthroplasty is deemed medically necessary. The treatment options including medical management, injection therapy, arthroscopy and arthroplasty were discussed at length. The risks and benefits of total hip arthroplasty were presented and reviewed. The risks due to aseptic loosening, infection, stiffness, dislocation/subluxation,  thromboembolic complications and other imponderables were discussed.  The patient acknowledged the explanation, agreed to proceed with the plan and consent was signed. Patient is being admitted for inpatient treatment for surgery, pain control, PT, OT, prophylactic antibiotics, VTE prophylaxis, progressive ambulation and ADL's and discharge planning.The patient is planning to be discharged home.     Hunter Pugh Erinne Gillentine   PA-C  04/04/2016, 2:39 PM

## 2016-04-04 NOTE — Progress Notes (Addendum)
MEDICAL CLEAR DR PERINI ON CHART 04-04-16 LOV dr Joylene Draft 01-26-16 on chart Stress test 02-25-15 epic CXR/EKG 11-12-15

## 2016-04-05 ENCOUNTER — Encounter (HOSPITAL_COMMUNITY): Payer: Self-pay

## 2016-04-05 ENCOUNTER — Encounter (HOSPITAL_COMMUNITY)
Admission: RE | Admit: 2016-04-05 | Discharge: 2016-04-05 | Disposition: A | Discharge status: Home / Self Care | Payer: PPO | Source: Ambulatory Visit | Attending: Orthopedic Surgery | Admitting: Orthopedic Surgery

## 2016-04-05 ENCOUNTER — Telehealth: Payer: Self-pay | Admitting: *Deleted

## 2016-04-05 DIAGNOSIS — Z01812 Encounter for preprocedural laboratory examination: Secondary | ICD-10-CM | POA: Diagnosis not present

## 2016-04-05 LAB — BASIC METABOLIC PANEL
ANION GAP: 6 (ref 5–15)
BUN: 21 mg/dL — AB (ref 6–20)
CO2: 27 mmol/L (ref 22–32)
Calcium: 9.8 mg/dL (ref 8.9–10.3)
Chloride: 108 mmol/L (ref 101–111)
Creatinine, Ser: 0.99 mg/dL (ref 0.61–1.24)
GFR calc Af Amer: >60 mL/min (ref >60)
GLUCOSE: 158 mg/dL — AB (ref 65–99)
POTASSIUM: 4.2 mmol/L (ref 3.5–5.1)
Sodium: 141 mmol/L (ref 135–145)

## 2016-04-05 LAB — SURGICAL PCR SCREEN
MRSA, PCR: NEGATIVE
STAPHYLOCOCCUS AUREUS: NEGATIVE

## 2016-04-05 LAB — CBC
HEMATOCRIT: 42.9 % (ref 39.0–52.0)
Hemoglobin: 14.9 g/dL (ref 13.0–17.0)
MCH: 30.8 pg (ref 26.0–34.0)
MCHC: 34.7 g/dL (ref 30.0–36.0)
MCV: 88.8 fL (ref 78.0–100.0)
PLATELETS: 126 10*3/uL — AB (ref 150–400)
RBC: 4.83 MIL/uL (ref 4.22–5.81)
RDW: 13.1 % (ref 11.5–15.5)
WBC: 3.9 10*3/uL — ABNORMAL LOW (ref 4.0–10.5)

## 2016-04-05 LAB — ABO/RH: ABO/RH(D): O POS

## 2016-04-05 LAB — GLUCOSE, CAPILLARY: Glucose-Capillary: 175 mg/dL — ABNORMAL HIGH (ref 65–99)

## 2016-04-05 NOTE — Telephone Encounter (Signed)
lvm for pt to stay on medication till I t/w pt tomorrow.

## 2016-04-05 NOTE — Telephone Encounter (Signed)
S/w [facility], Dr. Alvan Dame is in surgery all day with 8 cases and no down time.  Ms. Eugenie Birks will give Dr. Alvan Dame message tomorrow am, Dr. Alvan Dame is in office all day.  If this doesn't work for either [name] or Dr. Alvan Dame, will call Ms. Wills back today, otherwise Dr. Alvan Dame will call tomorrow in am to s/w [facility].  Will send to [place] to [place].

## 2016-04-05 NOTE — Progress Notes (Signed)
CBC results routed via epic to Dr Paralee Cancel

## 2016-04-05 NOTE — Telephone Encounter (Signed)
S/w [facility]/Sherry Eugenie Birks will call back this am for Hunter Mcbride to t/w Dr. Alvan Dame.  Also t/w pt and was told to not hold any medications till further notice.

## 2016-04-06 LAB — HEMOGLOBIN A1C
Hgb A1c MFr Bld: 7.6 % — ABNORMAL HIGH (ref 4.8–5.6)
MEAN PLASMA GLUCOSE: 171

## 2016-04-06 NOTE — Progress Notes (Signed)
LVMM with Judeen Hammans at [place] office. Need instructions for plavix to determine if Alvan Dame needs patient to hold Plavix prior to surgery.

## 2016-04-06 NOTE — Telephone Encounter (Signed)
Faxed to [facility] surgical clearance paperwork.

## 2016-04-06 NOTE — Telephone Encounter (Signed)
lvm waiting for Dr. Alvan Dame to call back to speak with [name] about pt's upcoming surgery and what medication to hold.

## 2016-04-06 NOTE — Telephone Encounter (Signed)
I have faxed the clearance note back - with Dr. Elmarie Shiley recommendations. We would prefer to NOT hold Plavix but would hold for 5 days if absolutely necessary. He is to stay on aspirin therapy. With his severe diffuse CAD, he would be at higher risk for intraoperative MI.   Burtis Junes, RN, [place] [address] [place], [place]  [phone]

## 2016-04-11 ENCOUNTER — Other Ambulatory Visit (HOSPITAL_COMMUNITY): Payer: Self-pay | Admitting: Emergency Medicine

## 2016-04-11 NOTE — Progress Notes (Signed)
No response from Dr Honor Loh office about Plavix cessation for patient . Called patient today and patient reports that he was told to stop Plavix 5 days prior to surgery and to continue on aspirin. Patient reports he is compliant with these instructions

## 2016-04-12 ENCOUNTER — Inpatient Hospital Stay (HOSPITAL_COMMUNITY)
Admission: RE | Admit: 2016-04-12 | Discharge: 2016-04-13 | DRG: 470 | Disposition: A | Discharge status: Home / Self Care | Payer: PPO | Source: Ambulatory Visit | Attending: Orthopedic Surgery | Admitting: Orthopedic Surgery

## 2016-04-12 ENCOUNTER — Inpatient Hospital Stay (HOSPITAL_COMMUNITY): Payer: PPO

## 2016-04-12 ENCOUNTER — Encounter (HOSPITAL_COMMUNITY): Payer: Self-pay | Admitting: *Deleted

## 2016-04-12 ENCOUNTER — Encounter (HOSPITAL_COMMUNITY)
Admission: RE | Disposition: A | Discharge status: Home / Self Care | Payer: Self-pay | Source: Ambulatory Visit | Attending: Orthopedic Surgery

## 2016-04-12 ENCOUNTER — Inpatient Hospital Stay (HOSPITAL_COMMUNITY): Payer: PPO | Admitting: Anesthesiology

## 2016-04-12 DIAGNOSIS — M1611 Unilateral primary osteoarthritis, right hip: Principal | ICD-10-CM | POA: Diagnosis present

## 2016-04-12 DIAGNOSIS — E785 Hyperlipidemia, unspecified: Secondary | ICD-10-CM | POA: Diagnosis not present

## 2016-04-12 DIAGNOSIS — E663 Overweight: Secondary | ICD-10-CM | POA: Diagnosis not present

## 2016-04-12 DIAGNOSIS — G4733 Obstructive sleep apnea (adult) (pediatric): Secondary | ICD-10-CM | POA: Diagnosis present

## 2016-04-12 DIAGNOSIS — Z833 Family history of diabetes mellitus: Secondary | ICD-10-CM

## 2016-04-12 DIAGNOSIS — Z6825 Body mass index (BMI) 25.0-25.9, adult: Secondary | ICD-10-CM

## 2016-04-12 DIAGNOSIS — E119 Type 2 diabetes mellitus without complications: Secondary | ICD-10-CM | POA: Diagnosis present

## 2016-04-12 DIAGNOSIS — I10 Essential (primary) hypertension: Secondary | ICD-10-CM | POA: Diagnosis present

## 2016-04-12 DIAGNOSIS — Z8249 Family history of ischemic heart disease and other diseases of the circulatory system: Secondary | ICD-10-CM

## 2016-04-12 DIAGNOSIS — I2510 Atherosclerotic heart disease of native coronary artery without angina pectoris: Secondary | ICD-10-CM | POA: Diagnosis not present

## 2016-04-12 DIAGNOSIS — Z96641 Presence of right artificial hip joint: Secondary | ICD-10-CM | POA: Diagnosis not present

## 2016-04-12 DIAGNOSIS — Z96649 Presence of unspecified artificial hip joint: Secondary | ICD-10-CM

## 2016-04-12 DIAGNOSIS — K219 Gastro-esophageal reflux disease without esophagitis: Secondary | ICD-10-CM | POA: Diagnosis not present

## 2016-04-12 DIAGNOSIS — I252 Old myocardial infarction: Secondary | ICD-10-CM | POA: Diagnosis not present

## 2016-04-12 DIAGNOSIS — M25551 Pain in right hip: Secondary | ICD-10-CM

## 2016-04-12 DIAGNOSIS — I25119 Atherosclerotic heart disease of native coronary artery with unspecified angina pectoris: Secondary | ICD-10-CM | POA: Diagnosis not present

## 2016-04-12 DIAGNOSIS — Z471 Aftercare following joint replacement surgery: Secondary | ICD-10-CM | POA: Diagnosis not present

## 2016-04-12 HISTORY — PX: TOTAL HIP ARTHROPLASTY: SHX124

## 2016-04-12 LAB — GLUCOSE, CAPILLARY
GLUCOSE-CAPILLARY: 255 mg/dL — AB (ref 65–99)
Glucose-Capillary: 113 mg/dL — ABNORMAL HIGH (ref 65–99)
Glucose-Capillary: 134 mg/dL — ABNORMAL HIGH (ref 65–99)
Glucose-Capillary: 223 mg/dL — ABNORMAL HIGH (ref 65–99)

## 2016-04-12 LAB — TYPE AND SCREEN
ABO/RH(D): O POS
Antibody Screen: NEGATIVE

## 2016-04-12 SURGERY — ARTHROPLASTY, HIP, TOTAL, ANTERIOR APPROACH
Anesthesia: General | Site: Hip | Laterality: Right

## 2016-04-12 MED ORDER — FERROUS SULFATE 325 (65 FE) MG PO TABS: 325.0000 mg | ORAL_TABLET | Freq: Three times a day (TID) | ORAL | Status: DC

## 2016-04-12 MED ORDER — ACETAMINOPHEN 500 MG PO TABS: 1000.0000 mg | ORAL_TABLET | Freq: Three times a day (TID) | ORAL | 0 refills | Status: DC

## 2016-04-12 MED ORDER — POLYETHYLENE GLYCOL 3350 17 G PO PACK: 17.0000 g | PACK | Freq: Two times a day (BID) | ORAL | 0 refills | Status: DC

## 2016-04-12 MED ORDER — METHOCARBAMOL 500 MG PO TABS: 500.0000 mg | ORAL_TABLET | Freq: Four times a day (QID) | ORAL | 0 refills | Status: DC | PRN

## 2016-04-12 MED ORDER — ALUM & MAG HYDROXIDE-SIMETH 200-200-20 MG/5ML PO SUSP: 30.0000 mL | ORAL | Status: DC | PRN

## 2016-04-12 MED ORDER — FENTANYL CITRATE (PF) 100 MCG/2ML IJ SOLN
INTRAMUSCULAR | Status: DC | PRN
  Administered 2016-04-12: 50 ug via INTRAVENOUS
  Administered 2016-04-12: 25 ug via INTRAVENOUS
  Administered 2016-04-12: 100 ug via INTRAVENOUS
  Administered 2016-04-12: 50 ug via INTRAVENOUS
  Administered 2016-04-12: 25 ug via INTRAVENOUS
  Administered 2016-04-12: 50 ug via INTRAVENOUS

## 2016-04-12 MED ORDER — ONDANSETRON HCL 4 MG PO TABS: 4.0000 mg | ORAL_TABLET | Freq: Four times a day (QID) | ORAL | Status: DC | PRN

## 2016-04-12 MED ORDER — LACTATED RINGERS IV SOLN
INTRAVENOUS | Status: DC
  Administered 2016-04-12 (×2): via INTRAVENOUS

## 2016-04-12 MED ORDER — PROPOFOL 10 MG/ML IV BOLUS
INTRAVENOUS | Status: DC | PRN
  Administered 2016-04-12: 150 mg via INTRAVENOUS

## 2016-04-12 MED ORDER — PROPOFOL 10 MG/ML IV BOLUS
INTRAVENOUS | Status: AC
  Filled 2016-04-12: qty 60

## 2016-04-12 MED ORDER — LIDOCAINE 2% (20 MG/ML) 5 ML SYRINGE
INTRAMUSCULAR | Status: AC
  Filled 2016-04-12: qty 5

## 2016-04-12 MED ORDER — MENTHOL 3 MG MT LOZG: 1.0000 | LOZENGE | OROMUCOSAL | Status: DC | PRN

## 2016-04-12 MED ORDER — FENTANYL CITRATE (PF) 100 MCG/2ML IJ SOLN
INTRAMUSCULAR | Status: AC
  Administered 2016-04-12: 50 ug via INTRAVENOUS
  Filled 2016-04-12: qty 2

## 2016-04-12 MED ORDER — HYDROCODONE-ACETAMINOPHEN 5-325 MG PO TABS
1.0000 | ORAL_TABLET | ORAL | Status: DC | PRN
  Administered 2016-04-12: 1 via ORAL
  Administered 2016-04-12: 2 via ORAL
  Administered 2016-04-12: 1 via ORAL
  Administered 2016-04-13 (×3): 2 via ORAL
  Filled 2016-04-12 (×2): qty 2
  Filled 2016-04-12: qty 1
  Filled 2016-04-12: qty 2
  Filled 2016-04-12: qty 1
  Filled 2016-04-12: qty 2

## 2016-04-12 MED ORDER — TRAMADOL HCL 50 MG PO TABS
50.0000 mg | ORAL_TABLET | Freq: Four times a day (QID) | ORAL | 0 refills | Status: DC | PRN
Start: 2016-04-12 — End: 2016-07-18

## 2016-04-12 MED ORDER — EPHEDRINE SULFATE 50 MG/ML IJ SOLN
INTRAMUSCULAR | Status: DC | PRN
  Administered 2016-04-12 (×5): 10 mg via INTRAVENOUS

## 2016-04-12 MED ORDER — CANAGLIFLOZIN 300 MG PO TABS
300.0000 mg | ORAL_TABLET | Freq: Every day | ORAL | Status: DC
  Administered 2016-04-13: 300 mg via ORAL
  Filled 2016-04-12: qty 1

## 2016-04-12 MED ORDER — GABAPENTIN 300 MG PO CAPS
300.0000 mg | ORAL_CAPSULE | Freq: Every day | ORAL | Status: DC
  Administered 2016-04-12: 300 mg via ORAL
  Filled 2016-04-12: qty 1

## 2016-04-12 MED ORDER — DIPHENHYDRAMINE HCL 25 MG PO CAPS: 25.0000 mg | ORAL_CAPSULE | Freq: Four times a day (QID) | ORAL | Status: DC | PRN

## 2016-04-12 MED ORDER — FENTANYL CITRATE (PF) 100 MCG/2ML IJ SOLN
25.0000 ug | INTRAMUSCULAR | Status: DC | PRN
  Administered 2016-04-12 (×2): 50 ug via INTRAVENOUS

## 2016-04-12 MED ORDER — FENTANYL CITRATE (PF) 100 MCG/2ML IJ SOLN
INTRAMUSCULAR | Status: AC
  Filled 2016-04-12: qty 4

## 2016-04-12 MED ORDER — ATENOLOL 25 MG PO TABS
50.0000 mg | ORAL_TABLET | Freq: Every day | ORAL | Status: DC
  Administered 2016-04-12: 50 mg via ORAL
  Filled 2016-04-12: qty 2

## 2016-04-12 MED ORDER — SUCCINYLCHOLINE CHLORIDE 200 MG/10ML IV SOSY
PREFILLED_SYRINGE | INTRAVENOUS | Status: AC
  Filled 2016-04-12: qty 10

## 2016-04-12 MED ORDER — FENTANYL CITRATE (PF) 100 MCG/2ML IJ SOLN
INTRAMUSCULAR | Status: AC
  Filled 2016-04-12: qty 2

## 2016-04-12 MED ORDER — NITROGLYCERIN 0.4 MG SL SUBL: 0.4000 mg | SUBLINGUAL_TABLET | SUBLINGUAL | Status: DC | PRN

## 2016-04-12 MED ORDER — CLOPIDOGREL BISULFATE 75 MG PO TABS
75.0000 mg | ORAL_TABLET | Freq: Every day | ORAL | Status: DC
  Administered 2016-04-13: 75 mg via ORAL
  Filled 2016-04-12: qty 1

## 2016-04-12 MED ORDER — SUGAMMADEX SODIUM 200 MG/2ML IV SOLN
INTRAVENOUS | Status: AC
Start: 2016-04-12 — End: 2016-04-12
  Filled 2016-04-12: qty 2

## 2016-04-12 MED ORDER — EPHEDRINE 5 MG/ML INJ
INTRAVENOUS | Status: AC
  Filled 2016-04-12: qty 10

## 2016-04-12 MED ORDER — DOCUSATE SODIUM 100 MG PO CAPS
100.0000 mg | ORAL_CAPSULE | Freq: Two times a day (BID) | ORAL | Status: DC
  Administered 2016-04-12 – 2016-04-13 (×2): 100 mg via ORAL
  Filled 2016-04-12 (×2): qty 1

## 2016-04-12 MED ORDER — SUCCINYLCHOLINE CHLORIDE 200 MG/10ML IV SOSY
PREFILLED_SYRINGE | INTRAVENOUS | Status: DC | PRN
  Administered 2016-04-12: 100 mg via INTRAVENOUS

## 2016-04-12 MED ORDER — POLYETHYLENE GLYCOL 3350 17 G PO PACK
17.0000 g | PACK | Freq: Two times a day (BID) | ORAL | Status: DC
  Administered 2016-04-12 – 2016-04-13 (×2): 17 g via ORAL
  Filled 2016-04-12 (×2): qty 1

## 2016-04-12 MED ORDER — HYDROMORPHONE HCL 1 MG/ML IJ SOLN: 0.5000 mg | INTRAMUSCULAR | Status: DC | PRN

## 2016-04-12 MED ORDER — AMLODIPINE BESYLATE 5 MG PO TABS
5.0000 mg | ORAL_TABLET | Freq: Two times a day (BID) | ORAL | Status: DC
  Administered 2016-04-12 – 2016-04-13 (×2): 5 mg via ORAL
  Filled 2016-04-12 (×2): qty 1

## 2016-04-12 MED ORDER — TRAMADOL HCL 50 MG PO TABS
50.0000 mg | ORAL_TABLET | Freq: Four times a day (QID) | ORAL | Status: DC
  Administered 2016-04-12: 100 mg via ORAL
  Filled 2016-04-12: qty 2

## 2016-04-12 MED ORDER — METHOCARBAMOL 500 MG PO TABS: 500.0000 mg | ORAL_TABLET | Freq: Four times a day (QID) | ORAL | Status: DC | PRN

## 2016-04-12 MED ORDER — CELECOXIB 200 MG PO CAPS
200.0000 mg | ORAL_CAPSULE | Freq: Two times a day (BID) | ORAL | Status: DC
  Administered 2016-04-12 – 2016-04-13 (×2): 200 mg via ORAL
  Filled 2016-04-12 (×2): qty 1

## 2016-04-12 MED ORDER — ASPIRIN 81 MG PO CHEW
81.0000 mg | CHEWABLE_TABLET | Freq: Every day | ORAL | Status: DC
  Administered 2016-04-13: 81 mg via ORAL
  Filled 2016-04-12: qty 1

## 2016-04-12 MED ORDER — HYDROMORPHONE HCL 1 MG/ML IJ SOLN
INTRAMUSCULAR | Status: AC
  Administered 2016-04-12: 0.5 mg via INTRAVENOUS
  Filled 2016-04-12: qty 1

## 2016-04-12 MED ORDER — PANTOPRAZOLE SODIUM 40 MG PO TBEC
40.0000 mg | DELAYED_RELEASE_TABLET | Freq: Two times a day (BID) | ORAL | Status: DC
  Administered 2016-04-12 – 2016-04-13 (×2): 40 mg via ORAL
  Filled 2016-04-12 (×2): qty 1

## 2016-04-12 MED ORDER — FERROUS SULFATE 325 (65 FE) MG PO TABS
325.0000 mg | ORAL_TABLET | Freq: Three times a day (TID) | ORAL | Status: DC
  Administered 2016-04-13: 325 mg via ORAL
  Filled 2016-04-12: qty 1

## 2016-04-12 MED ORDER — ONDANSETRON HCL 4 MG/2ML IJ SOLN
4.0000 mg | Freq: Four times a day (QID) | INTRAMUSCULAR | Status: DC | PRN
  Administered 2016-04-12 – 2016-04-13 (×2): 4 mg via INTRAVENOUS
  Filled 2016-04-12 (×2): qty 2

## 2016-04-12 MED ORDER — MEPERIDINE HCL 50 MG/ML IJ SOLN: 6.2500 mg | INTRAMUSCULAR | Status: DC | PRN

## 2016-04-12 MED ORDER — METOCLOPRAMIDE HCL 5 MG/ML IJ SOLN: 10.0000 mg | Freq: Once | INTRAMUSCULAR | Status: DC | PRN

## 2016-04-12 MED ORDER — TRAMADOL HCL 50 MG PO TABS: 50.0000 mg | ORAL_TABLET | Freq: Four times a day (QID) | ORAL | Status: DC

## 2016-04-12 MED ORDER — ONDANSETRON HCL 4 MG/2ML IJ SOLN
INTRAMUSCULAR | Status: AC
  Filled 2016-04-12: qty 2

## 2016-04-12 MED ORDER — IRBESARTAN 150 MG PO TABS
150.0000 mg | ORAL_TABLET | Freq: Every day | ORAL | Status: DC
  Administered 2016-04-12 – 2016-04-13 (×2): 150 mg via ORAL
  Filled 2016-04-12 (×2): qty 1

## 2016-04-12 MED ORDER — LIDOCAINE 2% (20 MG/ML) 5 ML SYRINGE
INTRAMUSCULAR | Status: DC | PRN
  Administered 2016-04-12: 60 mg via INTRAVENOUS

## 2016-04-12 MED ORDER — SUGAMMADEX SODIUM 200 MG/2ML IV SOLN
INTRAVENOUS | Status: DC | PRN
  Administered 2016-04-12: 200 mg via INTRAVENOUS

## 2016-04-12 MED ORDER — ROCURONIUM BROMIDE 50 MG/5ML IV SOSY
PREFILLED_SYRINGE | INTRAVENOUS | Status: AC
  Filled 2016-04-12: qty 5

## 2016-04-12 MED ORDER — ONDANSETRON HCL 4 MG/2ML IJ SOLN
INTRAMUSCULAR | Status: DC | PRN
  Administered 2016-04-12: 4 mg via INTRAVENOUS

## 2016-04-12 MED ORDER — SODIUM CHLORIDE 0.9 % IR SOLN
Status: DC | PRN
  Administered 2016-04-12: 1000 mL

## 2016-04-12 MED ORDER — ISOSORBIDE MONONITRATE ER 30 MG PO TB24
30.0000 mg | ORAL_TABLET | Freq: Two times a day (BID) | ORAL | Status: DC
  Administered 2016-04-12 – 2016-04-13 (×2): 30 mg via ORAL
  Filled 2016-04-12 (×2): qty 1

## 2016-04-12 MED ORDER — DEXAMETHASONE SODIUM PHOSPHATE 10 MG/ML IJ SOLN: 10.0000 mg | Freq: Once | INTRAMUSCULAR | Status: DC

## 2016-04-12 MED ORDER — SODIUM CHLORIDE 0.9 % IV SOLN
100.0000 mL/h | INTRAVENOUS | Status: DC
  Administered 2016-04-12: 100 mL/h via INTRAVENOUS
  Filled 2016-04-12 (×3): qty 1000

## 2016-04-12 MED ORDER — CHLORHEXIDINE GLUCONATE 4 % EX LIQD: 60.0000 mL | Freq: Once | CUTANEOUS | Status: DC

## 2016-04-12 MED ORDER — ROCURONIUM BROMIDE 50 MG/5ML IV SOSY
PREFILLED_SYRINGE | INTRAVENOUS | Status: DC | PRN
  Administered 2016-04-12: 20 mg via INTRAVENOUS

## 2016-04-12 MED ORDER — DEXAMETHASONE SODIUM PHOSPHATE 10 MG/ML IJ SOLN
INTRAMUSCULAR | Status: AC
  Filled 2016-04-12: qty 1

## 2016-04-12 MED ORDER — RANOLAZINE ER 500 MG PO TB12
500.0000 mg | ORAL_TABLET | Freq: Two times a day (BID) | ORAL | Status: DC
  Administered 2016-04-12 – 2016-04-13 (×2): 500 mg via ORAL
  Filled 2016-04-12 (×2): qty 1

## 2016-04-12 MED ORDER — HYDROMORPHONE HCL 1 MG/ML IJ SOLN
0.2500 mg | INTRAMUSCULAR | Status: DC | PRN
  Administered 2016-04-12 (×2): 0.5 mg via INTRAVENOUS

## 2016-04-12 MED ORDER — HYDROCHLOROTHIAZIDE 12.5 MG PO CAPS
12.5000 mg | ORAL_CAPSULE | ORAL | Status: DC
  Administered 2016-04-13: 12.5 mg via ORAL
  Filled 2016-04-12: qty 1

## 2016-04-12 MED ORDER — TRANEXAMIC ACID 1000 MG/10ML IV SOLN
1000.0000 mg | INTRAVENOUS | Status: AC
  Administered 2016-04-12: 1000 mg via INTRAVENOUS
  Filled 2016-04-12: qty 1100

## 2016-04-12 MED ORDER — DOCUSATE SODIUM 100 MG PO CAPS: 100.0000 mg | ORAL_CAPSULE | Freq: Two times a day (BID) | ORAL | 0 refills | Status: DC

## 2016-04-12 MED ORDER — ACETAMINOPHEN 500 MG PO TABS: 1000.0000 mg | ORAL_TABLET | Freq: Three times a day (TID) | ORAL | Status: DC

## 2016-04-12 MED ORDER — CEFAZOLIN SODIUM-DEXTROSE 2-4 GM/100ML-% IV SOLN
2.0000 g | INTRAVENOUS | Status: AC
  Administered 2016-04-12: 2 g via INTRAVENOUS
  Filled 2016-04-12: qty 100

## 2016-04-12 MED ORDER — MIDAZOLAM HCL 2 MG/2ML IJ SOLN
INTRAMUSCULAR | Status: DC | PRN
  Administered 2016-04-12: 2 mg via INTRAVENOUS

## 2016-04-12 MED ORDER — METOCLOPRAMIDE HCL 5 MG/ML IJ SOLN
5.0000 mg | Freq: Three times a day (TID) | INTRAMUSCULAR | Status: DC | PRN
  Administered 2016-04-13: 10 mg via INTRAVENOUS
  Filled 2016-04-12: qty 2

## 2016-04-12 MED ORDER — BISACODYL 10 MG RE SUPP: 10.0000 mg | Freq: Every day | RECTAL | Status: DC | PRN

## 2016-04-12 MED ORDER — INSULIN ASPART 100 UNIT/ML ~~LOC~~ SOLN
0.0000 [IU] | Freq: Three times a day (TID) | SUBCUTANEOUS | Status: DC
  Administered 2016-04-13: 3 [IU] via SUBCUTANEOUS

## 2016-04-12 MED ORDER — ALPRAZOLAM 0.5 MG PO TABS: 0.5000 mg | ORAL_TABLET | Freq: Every day | ORAL | Status: DC | PRN

## 2016-04-12 MED ORDER — STERILE WATER FOR IRRIGATION IR SOLN
Status: DC | PRN
  Administered 2016-04-12: 2000 mL

## 2016-04-12 MED ORDER — MAGNESIUM CITRATE PO SOLN: 1.0000 | Freq: Once | ORAL | Status: DC | PRN

## 2016-04-12 MED ORDER — ATORVASTATIN CALCIUM 20 MG PO TABS
40.0000 mg | ORAL_TABLET | Freq: Every day | ORAL | Status: DC
  Administered 2016-04-12: 40 mg via ORAL
  Filled 2016-04-12: qty 2

## 2016-04-12 MED ORDER — PHENOL 1.4 % MT LIQD
1.0000 | OROMUCOSAL | Status: DC | PRN
  Filled 2016-04-12: qty 177

## 2016-04-12 MED ORDER — CEFAZOLIN SODIUM-DEXTROSE 2-4 GM/100ML-% IV SOLN
2.0000 g | Freq: Four times a day (QID) | INTRAVENOUS | Status: AC
  Administered 2016-04-12 (×2): 2 g via INTRAVENOUS
  Filled 2016-04-12 (×2): qty 100

## 2016-04-12 MED ORDER — METHOCARBAMOL 1000 MG/10ML IJ SOLN
500.0000 mg | Freq: Four times a day (QID) | INTRAVENOUS | Status: DC | PRN
  Administered 2016-04-12: 500 mg via INTRAVENOUS
  Filled 2016-04-12: qty 550
  Filled 2016-04-12: qty 5

## 2016-04-12 MED ORDER — METFORMIN HCL ER 500 MG PO TB24
1000.0000 mg | ORAL_TABLET | Freq: Two times a day (BID) | ORAL | Status: DC
  Administered 2016-04-12 – 2016-04-13 (×2): 1000 mg via ORAL
  Filled 2016-04-12 (×2): qty 2

## 2016-04-12 MED ORDER — MIDAZOLAM HCL 2 MG/2ML IJ SOLN
INTRAMUSCULAR | Status: AC
  Filled 2016-04-12: qty 2

## 2016-04-12 MED ORDER — DEXAMETHASONE SODIUM PHOSPHATE 10 MG/ML IJ SOLN
INTRAMUSCULAR | Status: DC | PRN
  Administered 2016-04-12: 10 mg via INTRAVENOUS

## 2016-04-12 MED ORDER — METOCLOPRAMIDE HCL 5 MG PO TABS
5.0000 mg | ORAL_TABLET | Freq: Three times a day (TID) | ORAL | Status: DC | PRN
  Administered 2016-04-12 – 2016-04-13 (×2): 10 mg via ORAL
  Filled 2016-04-12 (×3): qty 2

## 2016-04-12 SURGICAL SUPPLY — 43 items
ADH SKN CLS APL DERMABOND .7 (GAUZE/BANDAGES/DRESSINGS) ×1
BAG DECANTER FOR FLEXI CONT (MISCELLANEOUS) IMPLANT
BAG SPEC THK2 15X12 ZIP CLS (MISCELLANEOUS)
BAG ZIPLOCK 12X15 (MISCELLANEOUS) IMPLANT
BLADE SAG 18X100X1.27 (BLADE) ×2 IMPLANT
CLOTH BEACON ORANGE TIMEOUT ST (SAFETY) ×2 IMPLANT
COVER PERINEAL POST (MISCELLANEOUS) ×2 IMPLANT
CUP ACETBLR 52 OD PINNACLE (Hips) ×1 IMPLANT
DERMABOND ADVANCED (GAUZE/BANDAGES/DRESSINGS) ×1
DERMABOND ADVANCED .7 DNX12 (GAUZE/BANDAGES/DRESSINGS) ×1 IMPLANT
DRAPE STERI IOBAN 125X83 (DRAPES) ×2 IMPLANT
DRAPE U-SHAPE 47X51 STRL (DRAPES) ×4 IMPLANT
DRESSING AQUACEL AG SP 3.5X10 (GAUZE/BANDAGES/DRESSINGS) ×1 IMPLANT
DRSG AQUACEL AG SP 3.5X10 (GAUZE/BANDAGES/DRESSINGS) ×2
DURAPREP 26ML APPLICATOR (WOUND CARE) ×2 IMPLANT
ELECT REM PT RETURN 15FT ADLT (MISCELLANEOUS) IMPLANT
ELECT REM PT RETURN 9FT ADLT (ELECTROSURGICAL) ×2
ELECTRODE REM PT RTRN 9FT ADLT (ELECTROSURGICAL) ×1 IMPLANT
ELIMINATOR HOLE APEX DEPUY (Hips) ×1 IMPLANT
GLOVE BIOGEL M STRL SZ7.5 (GLOVE) ×2 IMPLANT
GLOVE BIOGEL PI IND STRL 7.5 (GLOVE) ×1 IMPLANT
GLOVE BIOGEL PI IND STRL 8.5 (GLOVE) ×1 IMPLANT
GLOVE BIOGEL PI INDICATOR 7.5 (GLOVE) ×1
GLOVE BIOGEL PI INDICATOR 8.5 (GLOVE)
GLOVE ECLIPSE 8.0 STRL XLNG CF (GLOVE) ×2 IMPLANT
GLOVE ORTHO TXT STRL SZ7.5 (GLOVE) ×2 IMPLANT
GOWN STRL REUS W/TWL LRG LVL3 (GOWN DISPOSABLE) ×2 IMPLANT
GOWN STRL REUS W/TWL XL LVL3 (GOWN DISPOSABLE) ×2 IMPLANT
HEAD CERAMIC DELTA 36 PLUS 1.5 (Hips) ×1 IMPLANT
HOLDER FOLEY CATH W/STRAP (MISCELLANEOUS) ×2 IMPLANT
LINER NEUTRAL 52X36MM PLUS 4 (Liner) ×1 IMPLANT
PACK ANTERIOR HIP CUSTOM (KITS) ×2 IMPLANT
SCREW 6.5MMX30MM (Screw) ×1 IMPLANT
STEM TRI LOC BPS GRIPTON SZ 2 IMPLANT
SUT MNCRL AB 4-0 PS2 18 (SUTURE) ×2 IMPLANT
SUT VIC AB 1 CT1 36 (SUTURE) ×6 IMPLANT
SUT VIC AB 2-0 CT1 27 (SUTURE) ×4
SUT VIC AB 2-0 CT1 TAPERPNT 27 (SUTURE) ×2 IMPLANT
SUT VLOC 180 0 24IN GS25 (SUTURE) ×2 IMPLANT
TRAY FOLEY W/METER SILVER 16FR (SET/KITS/TRAYS/PACK) ×1 IMPLANT
TRI LOC BPS W GRIPTON SZ 2 ×2 IMPLANT
WATER STERILE IRR 1500ML POUR (IV SOLUTION) ×2 IMPLANT
YANKAUER SUCT BULB TIP 10FT TU (MISCELLANEOUS) ×1 IMPLANT

## 2016-04-12 NOTE — Interval H&P Note (Signed)
History and Physical Interval Note:  04/12/2016 9:53 AM  Hunter Mcbride.  has presented today for surgery, with the diagnosis of Right hip osteoarthritis  The various methods of treatment have been discussed with the patient and family. After consideration of risks, benefits and other options for treatment, the patient has consented to  Procedure(s) with comments: RIGHT TOTAL HIP ARTHROPLASTY ANTERIOR APPROACH (Right) - requests 70 mins as a surgical intervention .  The patient's history has been reviewed, patient examined, no change in status, stable for surgery.  I have reviewed the patient's chart and labs.  Questions were answered to the patient's satisfaction.     Mauri Pole

## 2016-04-12 NOTE — Op Note (Signed)
NAME:  Hunter Mcbride.                ACCOUNT NO.: [id number]      MEDICAL RECORD NO.: [id number]      FACILITY:  [facility]      PHYSICIAN:  Paralee Cancel D  DATE OF BIRTH:  1945-11-01     DATE OF PROCEDURE:  04/12/2016                                 OPERATIVE REPORT         PREOPERATIVE DIAGNOSIS: Right  hip osteoarthritis.      POSTOPERATIVE DIAGNOSIS:  Right hip osteoarthritis.      PROCEDURE:  Right total hip replacement through an anterior approach   utilizing DePuy THR system, component size 31m pinnacle cup, a size 36+4 neutral   Altrex liner, a size 2 Hi Tri Lock stem with a 36+1.5 delta ceramic   ball.      SURGEON:  MPietro Cassis OAlvan Dame M.D.      ASSISTANT:  BNehemiah Massed PA-C     ANESTHESIA:  General.      SPECIMENS:  None.      COMPLICATIONS:  None.      BLOOD LOSS:  500 cc     DRAINS:  None.      INDICATION OF THE PROCEDURE:  Hunter Mcbride is a 71y.o. male who had   presented to office for evaluation of right hip pain.  Radiographs revealed   progressive degenerative changes with bone-on-bone   articulation to the  hip joint.  The patient had painful limited range of   motion significantly affecting their overall quality of life.  The patient was failing to    respond to conservative measures, and at this point was ready   to proceed with more definitive measures.  The patient has noted progressive   degenerative changes in his hip, progressive problems and dysfunction   with regarding the hip prior to surgery.  Consent was obtained for   benefit of pain relief.  Specific risk of infection, DVT, component   failure, dislocation, need for revision surgery, as well discussion of   the anterior versus posterior approach were reviewed.  Consent was   obtained for benefit of anterior pain relief through an anterior   approach.      PROCEDURE IN DETAIL:  The patient was brought to operative theater.   Once adequate anesthesia,  preoperative antibiotics, 2gm of Ancef, 1 gm of Tranexamic Acid, and 10 mg of Decadron administered.   The patient was positioned supine on the OSI Hanna table.  Once adequate   padding of boney process was carried out, we had predraped out the hip, and  used fluoroscopy to confirm orientation of the pelvis and position.      The right hip was then prepped and draped from proximal iliac crest to   mid thigh with shower curtain technique.      Time-out was performed identifying the patient, planned procedure, and   extremity.     An incision was then made 2 cm distal and lateral to the   anterior superior iliac spine extending over the orientation of the   tensor fascia lata muscle and sharp dissection was carried down to the   fascia of the muscle and protractor placed in the soft tissues.      The  fascia was then incised.  The muscle belly was identified and swept   laterally and retractor placed along the superior neck.  Following   cauterization of the circumflex vessels and removing some pericapsular   fat, a second cobra retractor was placed on the inferior neck.  A third   retractor was placed on the anterior acetabulum after elevating the   anterior rectus.  A L-capsulotomy was along the line of the   superior neck to the trochanteric fossa, then extended proximally and   distally.  Tag sutures were placed and the retractors were then placed   intracapsular.  We then identified the trochanteric fossa and   orientation of my neck cut, confirmed this radiographically   and then made a neck osteotomy with the femur on traction.  The femoral   head was removed without difficulty or complication.  Traction was let   off and retractors were placed posterior and anterior around the   acetabulum.      The labrum and foveal tissue were debrided.  I began reaming with a 33m   reamer and reamed up to 534mreamer with good bony bed preparation and a 5219m cup was chosen.  The final 51m33minnacle cup was then impacted under fluoroscopy  to confirm the depth of penetration and orientation with respect to   abduction.  A screw was placed followed by the hole eliminator.  The final   36+4 neutral Altrex liner was impacted with good visualized rim fit.  The cup was positioned anatomically within the acetabular portion of the pelvis.      At this point, the femur was rolled at 80 degrees.  Further capsule was   released off the inferior aspect of the femoral neck.  I then   released the superior capsule proximally.  The hook was placed laterally   along the femur and elevated manually and held in position with the bed   hook.  The leg was then extended and adducted with the leg rolled to 100   degrees of external rotation.  Once the proximal femur was fully   exposed, I used a box osteotome to set orientation.  I then began   broaching with the starting chili pepper broach and passed this by hand and then broached up to 2.  With the 2 broach in place I chose a high offset neck and did several trial reductions.  The offset was appropriate, leg lengths   appeared to be equal best matched with the +1.5 head ball confirmed radiographically.   Given these findings, I went ahead and dislocated the hip, repositioned all   retractors and positioned the right hip in the extended and abducted position.  The final 2 Hi Tri Lock stem was   chosen and it was impacted down to the level of neck cut.  Based on this   and the trial reduction, a 36+1.5 delta ceramic ball was chosen and   impacted onto a clean and dry trunnion, and the hip was reduced.  The   hip had been irrigated throughout the case again at this point.  I did   reapproximate the superior capsular leaflet to the anterior leaflet   using #1 Vicryl.  The fascia of the   tensor fascia lata muscle was then reapproximated using #1 Vicryl.  The   remaining wound was closed with 2-0 Vicryl and running 4-0 Monocryl.   The hip was cleaned,  dried, and dressed sterilely  using Dermabond and   Aquacel dressing.  He was then brought   to recovery room in stable condition tolerating the procedure well.    Nehemiah Massed, PA-C was present for the entirety of the case involved from   preoperative positioning, perioperative retractor management, general   facilitation of the case, as well as primary wound closure as assistant.            Pietro Cassis Alvan Dame, M.D.        04/12/2016 12:24 PM

## 2016-04-12 NOTE — Discharge Instructions (Signed)

## 2016-04-12 NOTE — Anesthesia Procedure Notes (Signed)
Procedure Name: Intubation Date/Time: 04/12/2016 11:01 AM Performed by: Dione Booze Pre-anesthesia Checklist: Emergency Drugs available, Suction available, Patient being monitored and Patient identified Patient Re-evaluated:Patient Re-evaluated prior to inductionOxygen Delivery Method: Circle system utilized Preoxygenation: Pre-oxygenation with 100% oxygen Intubation Type: IV induction Laryngoscope Size: Mac and 4 Grade View: Grade I Tube type: Oral Tube size: 7.5 mm Number of attempts: 1 Airway Equipment and Method: Stylet Placement Confirmation: ETT inserted through vocal cords under direct vision,  positive ETCO2 and breath sounds checked- equal and bilateral Secured at: 22 cm Tube secured with: Tape Dental Injury: Teeth and Oropharynx as per pre-operative assessment

## 2016-04-12 NOTE — Anesthesia Postprocedure Evaluation (Signed)
Anesthesia Post Note  Patient: Walter Grima.  Procedure(s) Performed: Procedure(s) (LRB): RIGHT TOTAL HIP ARTHROPLASTY ANTERIOR APPROACH (Right)  Patient location during evaluation: PACU Anesthesia Type: General Level of consciousness: awake and alert Pain management: pain level controlled Vital Signs Assessment: post-procedure vital signs reviewed and stable Respiratory status: spontaneous breathing, nonlabored ventilation, respiratory function stable and patient connected to nasal cannula oxygen Cardiovascular status: blood pressure returned to baseline and stable Postop Assessment: no signs of nausea or vomiting Anesthetic complications: no       Last Vitals:  Vitals:   04/12/16 1315 04/12/16 1330  BP: 118/70   Pulse: 84 78  Resp: 18 15  Temp:      Last Pain:  Vitals:   04/12/16 1315  TempSrc:   PainSc: 8         RLE Motor Response: Purposeful movement (04/12/16 1330) RLE Sensation: Full sensation (04/12/16 1330)      Montez Hageman

## 2016-04-12 NOTE — Transfer of Care (Signed)
Immediate Anesthesia Transfer of Care Note  Patient: Hunter Mcbride.  Procedure(s) Performed: Procedure(s) with comments: RIGHT TOTAL HIP ARTHROPLASTY ANTERIOR APPROACH (Right) - requests 70 mins  Patient Location: PACU  Anesthesia Type:General  Level of Consciousness: awake, alert , oriented and patient cooperative  Airway & Oxygen Therapy: Patient Spontanous Breathing and Patient connected to face mask oxygen  Post-op Assessment: Report given to RN and Post -op Vital signs reviewed and stable  Post vital signs: Reviewed and stable  Last Vitals:  Vitals:   04/12/16 0824  BP: (!) 145/78  Pulse: (!) 53  Resp: 18  Temp: 36.7 C    Last Pain:  Vitals:   04/12/16 0824  TempSrc: Oral      Patients Stated Pain Goal: 4 (21/30/86 5784)  Complications: No apparent anesthesia complications

## 2016-04-12 NOTE — Anesthesia Preprocedure Evaluation (Signed)
Anesthesia Evaluation  Patient identified by MRN, date of birth, ID band Patient awake    Reviewed: Allergy & Precautions, NPO status , Patient's Chart, lab work & pertinent test results  Airway Mallampati: II  TM Distance: >3 FB Neck ROM: Full    Dental no notable dental hx.    Pulmonary sleep apnea ,    Pulmonary exam normal breath sounds clear to auscultation       Cardiovascular Exercise Tolerance: Good hypertension, Pt. on medications + angina + CAD, + Past MI and + Cardiac Stents  Normal cardiovascular exam Rhythm:Regular Rate:Normal  Stress test 6/17 was negative. 13.3 METs   Neuro/Psych negative neurological ROS  negative psych ROS   GI/Hepatic negative GI ROS, Neg liver ROS,   Endo/Other  diabetes, Type 2  Renal/GU negative Renal ROS  negative genitourinary   Musculoskeletal negative musculoskeletal ROS (+)   Abdominal   Peds negative pediatric ROS (+)  Hematology negative hematology ROS (+)   Anesthesia Other Findings   Reproductive/Obstetrics negative OB ROS                             Anesthesia Physical Anesthesia Plan  ASA: III  Anesthesia Plan: General   Post-op Pain Management:    Induction: Intravenous  Airway Management Planned: Oral ETT  Additional Equipment:   Intra-op Plan:   Post-operative Plan: Extubation in OR  Informed Consent: I have reviewed the patients History and Physical, chart, labs and discussed the procedure including the risks, benefits and alternatives for the proposed anesthesia with the patient or authorized representative who has indicated his/her understanding and acceptance.   Dental advisory given  Plan Discussed with: CRNA  Anesthesia Plan Comments: (Not a candidate for SAB. Plavix 5 days ago. Risks of perioperative MI discussed at length. Will proceed under GA with tight hemodynamic control)        Anesthesia Quick  Evaluation

## 2016-04-13 DIAGNOSIS — E663 Overweight: Secondary | ICD-10-CM | POA: Diagnosis present

## 2016-04-13 LAB — BASIC METABOLIC PANEL
Anion gap: 5 (ref 5–15)
BUN: 21 mg/dL — AB (ref 6–20)
CHLORIDE: 108 mmol/L (ref 101–111)
CO2: 25 mmol/L (ref 22–32)
CREATININE: 1.1 mg/dL (ref 0.61–1.24)
Calcium: 8.7 mg/dL — ABNORMAL LOW (ref 8.9–10.3)
GFR calc Af Amer: >60 mL/min (ref >60)
GFR calc non Af Amer: >60 mL/min (ref >60)
GLUCOSE: 179 mg/dL — AB (ref 65–99)
Potassium: 4.4 mmol/L (ref 3.5–5.1)
Sodium: 138 mmol/L (ref 135–145)

## 2016-04-13 LAB — CBC
HEMATOCRIT: 30.5 % — AB (ref 39.0–52.0)
Hemoglobin: 10.7 g/dL — ABNORMAL LOW (ref 13.0–17.0)
MCH: 30.7 pg (ref 26.0–34.0)
MCHC: 35.1 g/dL (ref 30.0–36.0)
MCV: 87.6 fL (ref 78.0–100.0)
Platelets: 127 10*3/uL — ABNORMAL LOW (ref 150–400)
RBC: 3.48 MIL/uL — ABNORMAL LOW (ref 4.22–5.81)
RDW: 13 % (ref 11.5–15.5)
WBC: 6.8 10*3/uL (ref 4.0–10.5)

## 2016-04-13 LAB — GLUCOSE, CAPILLARY: GLUCOSE-CAPILLARY: 163 mg/dL — AB (ref 65–99)

## 2016-04-13 MED ORDER — ONDANSETRON HCL 4 MG PO TABS: 4.0000 mg | ORAL_TABLET | Freq: Four times a day (QID) | ORAL | 0 refills | Status: DC | PRN

## 2016-04-13 MED ORDER — METHOCARBAMOL 500 MG PO TABS: 500.0000 mg | ORAL_TABLET | Freq: Four times a day (QID) | ORAL | 0 refills | Status: DC | PRN

## 2016-04-13 MED ORDER — HYDROCODONE-ACETAMINOPHEN 5-325 MG PO TABS: 1.0000 | ORAL_TABLET | ORAL | 0 refills | Status: DC | PRN

## 2016-04-13 NOTE — Evaluation (Signed)
Physical Therapy One Time Evaluation Patient Details Name: Hunter Mcbride. MRN: [id number] DOB: [birth year] Today's Date: 04/13/2016   History of Present Illness  Pt is a 71 year old male s/p R direct anterior THA  Clinical Impression  Patient evaluated by Physical Therapy with no further acute PT needs identified. All education has been completed and the patient has no further questions.  Pt ambulating well with RW and practiced safe stair technique with family present.  Pt also performed LE exercises and provided with HEP handout.  Discussed proper use of cane as pt and family had questions however encouraged pt to use RW a couple days upon d/c for pain control.  No f/u PT per surgeon. PT is signing off. Thank you for this referral.     Follow Up Recommendations No PT follow up    Equipment Recommendations  None recommended by PT    Recommendations for Other Services       Precautions / Restrictions Precautions Precautions: None Restrictions Weight Bearing Restrictions: No Other Position/Activity Restrictions: WBAT      Mobility  Bed Mobility Overal bed mobility: Modified Independent             General bed mobility comments: increased time and effort  Transfers Overall transfer level: Needs assistance Equipment used: Rolling walker (2 wheeled) Transfers: Sit to/from Stand Sit to Stand: Min guard;Supervision         General transfer comment: verbal cues for hand placement  Ambulation/Gait Ambulation/Gait assistance: Min guard;Supervision Ambulation Distance (Feet): 180 Feet Assistive device: Rolling walker (2 wheeled) Gait Pattern/deviations: Step-through pattern;Decreased stance time - right;Antalgic     General Gait Details: verbal cues for sequencing, posture, step length, reports pain controlled  Stairs Stairs: Yes Stairs assistance: Min guard Stair Management: Step to pattern;Forwards;One rail Right Number of Stairs: 5 General stair comments: 2  up and 3 down then reversed for second time, verbal cues for hand placement and sequencing, family present and observed  Wheelchair Mobility    Modified Rankin (Stroke Patients Only)       Balance                                             Pertinent Vitals/Pain Pain Assessment: 0-10 Pain Score: 3  Pain Location: R hip Pain Descriptors / Indicators: Sore Pain Intervention(s): Monitored during session;Limited activity within patient's tolerance;Patient requesting pain meds-RN notified    Home Living Family/patient expects to be discharged to:: Private residence Living Arrangements: Spouse/significant other Available Help at Discharge: Family;Available 24 hours/day Type of Home: House Home Access: Stairs to enter Entrance Stairs-Rails: Right Entrance Stairs-Number of Steps: 4 Home Layout: One level Home Equipment: Walker - 2 wheels;Cane - single point      Prior Function Level of Independence: Independent               Hand Dominance        Extremity/Trunk Assessment        Lower Extremity Assessment Lower Extremity Assessment: RLE deficits/detail RLE Deficits / Details: anticipated post op hip weakness, good quad contraction       Communication   Communication: No difficulties  Cognition Arousal/Alertness: Awake/alert Behavior During Therapy: WFL for tasks assessed/performed Overall Cognitive Status: Within Functional Limits for tasks assessed  General Comments      Exercises Total Joint Exercises Peppel Arc Quad: AROM;Right;10 reps;Seated Knee Flexion: AROM;Standing;Right;10 reps (all standing exercises performed with UE support) Marching in Standing: AROM;Right;10 reps Standing Hip Extension: AROM;Right;10 reps;Standing General Exercises - Lower Extremity Heel Raises: AROM;Both;10 reps;Standing   Assessment/Plan    PT Assessment Patent does not need any further PT services  PT Problem List  Decreased mobility;Decreased strength;Decreased knowledge of use of DME;Pain       PT Treatment Interventions      PT Goals (Current goals can be found in the Care Plan section)  Acute Rehab PT Goals PT Goal Formulation: All assessment and education complete, DC therapy    Frequency     Barriers to discharge        Co-evaluation               End of Session   Activity Tolerance: Patient tolerated treatment well Patient left: in chair;with call bell/phone within reach;with family/visitor present Nurse Communication: Mobility status PT Visit Diagnosis: Other abnormalities of gait and mobility (R26.89)         Time: 7948-0165 PT Time Calculation (min) (ACUTE ONLY): 20 min   Charges:   PT Evaluation $PT Eval Low Complexity: 1 Procedure     PT G Codes:         Aneta Hendershott,KATHrine E 04/13/2016, 10:03 AM Carmelia Bake, PT, DPT 04/13/2016 Pager: [phone]

## 2016-04-13 NOTE — Progress Notes (Signed)
     Subjective: 1 Day Post-Op Procedure(s) (LRB): RIGHT TOTAL HIP ARTHROPLASTY ANTERIOR APPROACH (Right)   Patient reports pain as mild, pain controlled. No events throughout the night.  Discussed analgesic medication, and will need anti-nausea medication to take along with meds.  Ready to be discharged home.   Objective:   VITALS:   Vitals:   04/13/16 0145 04/13/16 0606  BP: 116/63 107/60  Pulse: 66 60  Resp: 16 16  Temp: 97.8 F (36.6 C) 98 F (36.7 C)    Dorsiflexion/Plantar flexion intact Incision: dressing C/D/I No cellulitis present Compartment soft  LABS  Recent Labs  04/13/16 0422  HGB 10.7*  HCT 30.5*  WBC 6.8  PLT 127*     Recent Labs  04/13/16 0422  NA 138  K 4.4  BUN 21*  CREATININE 1.10  GLUCOSE 179*     Assessment/Plan: 1 Day Post-Op Procedure(s) (LRB): RIGHT TOTAL HIP ARTHROPLASTY ANTERIOR APPROACH (Right) Foley cath d/c'ed Advance diet Up with therapy D/C IV fluids Discharge home Follow up in 2 weeks at [facility]. Follow up with OLIN,Mohmed Farver D in 2 weeks.  Contact information:  [facility] [address] [place] [phone]    Overweight (BMI 25-29.9) Estimated body mass index is 29.24 kg/m as calculated from the following:   Height as of this encounter: '5\' 9"'$  (1.753 m).   Weight as of this encounter: 89.8 kg (198 lb). Patient also counseled that weight may inhibit the healing process Patient counseled that losing weight will help with future health issues         West Pugh. Bahar Shelden   PAC  04/13/2016, 8:19 AM

## 2016-04-13 NOTE — Progress Notes (Signed)
RN reviewed discharge instructions with patient and family. All questions answered.   Paperwork and prescriptions given.   NT rolled patient down with all belongings to family car. 

## 2016-04-17 NOTE — Discharge Summary (Signed)
Physician Discharge Summary  Patient ID: Hunter Mcbride. MRN: [id number] DOB/AGE: 09-24-1945 71 y.o.  Admit date: 04/12/2016 Discharge date: 04/13/2016   Procedures:  Procedure(s) (LRB): RIGHT TOTAL HIP ARTHROPLASTY ANTERIOR APPROACH (Right)  Attending Physician:  Dr. Paralee Cancel   Admission Diagnoses:   Right hip primary OA / pain  Discharge Diagnoses:  Principal Problem:   S/P right THA, AA Active Problems:   Overweight (BMI 25.0-29.9)  Past Medical History:  Diagnosis Date  . Arthritis    "lower back; right shoulder; left thumb; joints" (06/21/2013)  . Asthma    "not sure if this is true or not" (06/21/2013)  . CAD (coronary artery disease) May 2015   s/p PTCA/DES x 3 to mid LAD, mod non-ob disease in Cx and RCA May 2015; s/p repeat caths - last study in November of 2015 - stents patent, - FFR - managed medically.   [name] GERD (gastroesophageal reflux disease)   . Gout    "maybe twice in my life"  . Headache(784.0)    "weekly for the last 3-4 months" (06/21/2013)  . Hyperlipidemia   . Hypertension   . Myocardial infarction    "/Dr. Katharina Caper I've had one between 2014-2015) (06/21/2013)  . Osteoarthritis   . Sleep apnea    WEIGHT LOSS, NO LONGER NEEDS PER PATIENT  . Type II diabetes mellitus (HCC)    TYPE 2    HPI:    Hunter Mcbride., 71 y.o. male, has a history of pain and functional disability in the right hip(s) due to arthritis and patient has failed non-surgical conservative treatments for greater than 12 weeks to include NSAID's and/or analgesics, corticosteriod injections and activity modification.  Onset of symptoms was gradual starting 1+ years ago with gradually worsening course since that time.The patient noted no past surgery on the right hip(s).  Patient currently rates pain in the right hip at 9 out of 10 with activity. Patient has night pain, worsening of pain with activity and weight bearing, trendelenberg gait, pain that interfers with activities of daily  living and pain with passive range of motion. Patient has evidence of periarticular osteophytes and joint space narrowing by imaging studies. This condition presents safety issues increasing the risk of falls. There is no current active infection.   Risks, benefits and expectations were discussed with the patient.  Risks including but not limited to the risk of anesthesia, blood clots, nerve damage, blood vessel damage, failure of the prosthesis, infection and up to and including death.  Patient understand the risks, benefits and expectations and wishes to proceed with surgery.   PCP: Jerlyn Ly, MD   Discharged Condition: good  Hospital Course:  Patient underwent the above stated procedure on 04/12/2016. Patient tolerated the procedure well and brought to the recovery room in good condition and subsequently to the floor.  POD #1 BP: 107/60 ; Pulse: 60 ; Temp: 98 F (36.7 C) ; Resp: 16 Patient reports pain as mild, pain controlled. No events throughout the night.  Discussed analgesic medication, and will need anti-nausea medication to take along with meds.  Ready to be discharged home. Dorsiflexion/plantar flexion intact, incision: dressing C/D/I, no cellulitis present and compartment soft.   LABS  Basename    HGB     10.7  HCT     30.5    Discharge Exam: General appearance: alert, cooperative and no distress Extremities: Homans sign is negative, no sign of DVT, no edema, redness or tenderness in the calves or  thighs and no ulcers, gangrene or trophic changes  Disposition: Home with follow up in 2 weeks   Follow-up Information    Mauri Pole, MD. Schedule an appointment as soon as possible for a visit in 2 week(s).   Specialty:  Orthopedic Surgery Contact information: [address] [place] [address] [phone]           Discharge Instructions    Call MD / Call 911    Complete by:  As directed    If you experience chest pain or shortness of  breath, CALL 911 and be transported to the hospital emergency room.  If you develope a fever above 101 F, pus (white drainage) or increased drainage or redness at the wound, or calf pain, call your surgeon's office.   Change dressing    Complete by:  As directed    Maintain surgical dressing until follow up in the clinic. If the edges start to pull up, may reinforce with tape. If the dressing is no longer working, may remove and cover with gauze and tape, but must keep the area dry and clean.  Call with any questions or concerns.   Constipation Prevention    Complete by:  As directed    Drink plenty of fluids.  Prune juice may be helpful.  You may use a stool softener, such as Colace (over the counter) 100 mg twice a day.  Use MiraLax (over the counter) for constipation as needed.   Diet - low sodium heart healthy    Complete by:  As directed    Discharge instructions    Complete by:  As directed    Maintain surgical dressing until follow up in the clinic. If the edges start to pull up, may reinforce with tape. If the dressing is no longer working, may remove and cover with gauze and tape, but must keep the area dry and clean.  Follow up in 2 weeks at [facility]. Call with any questions or concerns.   Increase activity slowly as tolerated    Complete by:  As directed    Weight bearing as tolerated with assist device (walker, cane, etc) as directed, use it as Rosch as suggested by your surgeon or therapist, typically at least 4-6 weeks.   TED hose    Complete by:  As directed    Use stockings (TED hose) for 2 weeks on both leg(s).  You may remove them at night for sleeping.      Allergies as of 04/13/2016      Reactions   Vicodin [hydrocodone-acetaminophen] Nausea And Vomiting      Medication List    STOP taking these medications   TYLENOL ARTHRITIS PAIN 650 MG CR tablet Generic drug:  acetaminophen Replaced by:  acetaminophen 500 MG tablet     TAKE these medications     acetaminophen 500 MG tablet Commonly known as:  TYLENOL Take 2 tablets (1,000 mg total) by mouth every 8 (eight) hours. Replaces:  TYLENOL ARTHRITIS PAIN 650 MG CR tablet Notes to patient:  Do not take supplemental tylenol while taking hydrocodone/norco because this medication already contains tylenol.    ALPRAZolam 0.5 MG tablet Commonly known as:  XANAX Take 0.5 mg by mouth daily as needed for anxiety.   amLODipine 5 MG tablet Commonly known as:  NORVASC Take 1 tablet (5 mg total) by mouth 2 (two) times daily.   aspirin 81 MG tablet Take 81 mg by mouth at bedtime.   atenolol 50  MG tablet Commonly known as:  TENORMIN Take 1 tablet (50 mg total) by mouth daily. What changed:  when to take this   atorvastatin 80 MG tablet Commonly known as:  LIPITOR Take 40 mg by mouth daily at 6 PM.   clopidogrel 75 MG tablet Commonly known as:  PLAVIX Take 1 tablet (75 mg total) by mouth daily with breakfast.   docusate sodium 100 MG capsule Commonly known as:  COLACE Take 1 capsule (100 mg total) by mouth 2 (two) times daily.   ferrous sulfate 325 (65 FE) MG tablet Commonly known as:  FERROUSUL Take 1 tablet (325 mg total) by mouth 3 (three) times daily with meals.   gabapentin 300 MG capsule Commonly known as:  NEURONTIN Take 300 mg by mouth at bedtime. Notes to patient:  Take after each meal for approximately 2 weeks, will turn stool a dark green/black color   hydrochlorothiazide 12.5 MG capsule Commonly known as:  MICROZIDE Take 12.5 mg by mouth every Monday, Wednesday, and Friday. Notes to patient:  Home regimen, given today, Wednesday 2/28   HYDROcodone-acetaminophen 5-325 MG tablet Commonly known as:  NORCO/VICODIN Take 1-2 tablets by mouth every 4 (four) hours as needed for moderate pain or severe pain.   INVOKANA 300 MG Tabs tablet Generic drug:  canagliflozin Take 300 mg by mouth daily before breakfast.   irbesartan 300 MG tablet Commonly known as:  AVAPRO Take  0.5 tablets (150 mg total) by mouth daily.   isosorbide mononitrate 30 MG 24 hr tablet Commonly known as:  IMDUR TAKE 1 TABLET BY MOUTH TWICE (2) DAILY   metFORMIN 500 MG 24 hr tablet Commonly known as:  GLUCOPHAGE-XR Take 1,000 mg by mouth 2 (two) times daily.   methocarbamol 500 MG tablet Commonly known as:  ROBAXIN Take 1 tablet (500 mg total) by mouth every 6 (six) hours as needed for muscle spasms.   nitroGLYCERIN 0.4 MG SL tablet Commonly known as:  NITROSTAT Place 1 tablet (0.4 mg total) under the tongue every 5 (five) minutes as needed. For chest pain   ondansetron 4 MG tablet Commonly known as:  ZOFRAN Take 1 tablet (4 mg total) by mouth every 6 (six) hours as needed for nausea. Notes to patient:  Nausea medication   pantoprazole 40 MG tablet Commonly known as:  PROTONIX Take 40 mg by mouth 2 (two) times daily.   polyethylene glycol packet Commonly known as:  MIRALAX / GLYCOLAX Take 17 g by mouth 2 (two) times daily.   ranolazine 500 MG 12 hr tablet Commonly known as:  RANEXA Take 1 tablet (500 mg total) by mouth 2 (two) times daily.   traMADol 50 MG tablet Commonly known as:  ULTRAM Take 1-2 tablets (50-100 mg total) by mouth every 6 (six) hours as needed. What changed:  how much to take  when to take this  reasons to take this        Signed: [place] Pugh. Fernando Torry   PA-C  04/17/2016, 1:46 PM

## 2016-05-06 DIAGNOSIS — Z125 Encounter for screening for malignant neoplasm of prostate: Secondary | ICD-10-CM | POA: Diagnosis not present

## 2016-05-06 DIAGNOSIS — E119 Type 2 diabetes mellitus without complications: Secondary | ICD-10-CM | POA: Diagnosis not present

## 2016-05-06 DIAGNOSIS — E784 Other hyperlipidemia: Secondary | ICD-10-CM | POA: Diagnosis not present

## 2016-05-06 DIAGNOSIS — I10 Essential (primary) hypertension: Secondary | ICD-10-CM | POA: Diagnosis not present

## 2016-05-11 DIAGNOSIS — Z1212 Encounter for screening for malignant neoplasm of rectum: Secondary | ICD-10-CM | POA: Diagnosis not present

## 2016-05-12 DIAGNOSIS — F418 Other specified anxiety disorders: Secondary | ICD-10-CM | POA: Diagnosis not present

## 2016-05-12 DIAGNOSIS — E119 Type 2 diabetes mellitus without complications: Secondary | ICD-10-CM | POA: Diagnosis not present

## 2016-05-12 DIAGNOSIS — I2510 Atherosclerotic heart disease of native coronary artery without angina pectoris: Secondary | ICD-10-CM | POA: Diagnosis not present

## 2016-05-12 DIAGNOSIS — M25551 Pain in right hip: Secondary | ICD-10-CM | POA: Diagnosis not present

## 2016-05-12 DIAGNOSIS — Z6828 Body mass index (BMI) 28.0-28.9, adult: Secondary | ICD-10-CM | POA: Diagnosis not present

## 2016-05-12 DIAGNOSIS — M25511 Pain in right shoulder: Secondary | ICD-10-CM | POA: Diagnosis not present

## 2016-05-12 DIAGNOSIS — Z0000 Encounter for general adult medical examination without abnormal findings: Secondary | ICD-10-CM | POA: Diagnosis not present

## 2016-05-12 DIAGNOSIS — H9193 Unspecified hearing loss, bilateral: Secondary | ICD-10-CM | POA: Diagnosis not present

## 2016-05-12 DIAGNOSIS — G4733 Obstructive sleep apnea (adult) (pediatric): Secondary | ICD-10-CM | POA: Diagnosis not present

## 2016-05-12 DIAGNOSIS — I208 Other forms of angina pectoris: Secondary | ICD-10-CM | POA: Diagnosis not present

## 2016-05-12 DIAGNOSIS — Z1389 Encounter for screening for other disorder: Secondary | ICD-10-CM | POA: Diagnosis not present

## 2016-05-12 DIAGNOSIS — J45998 Other asthma: Secondary | ICD-10-CM | POA: Diagnosis not present

## 2016-05-20 ENCOUNTER — Encounter: Payer: Self-pay | Admitting: Nurse Practitioner

## 2016-05-31 ENCOUNTER — Telehealth: Payer: Self-pay | Admitting: Nurse Practitioner

## 2016-05-31 MED ORDER — ISOSORBIDE MONONITRATE ER 30 MG PO TB24: ORAL_TABLET | ORAL | 2 refills | Status: DC

## 2016-05-31 NOTE — Telephone Encounter (Signed)
Patient calling the office for samples of medication:   1.  What medication and dosage are you requesting samples for? Ranexa '500mg'$   2.  Are you currently out of this medication?  Yes and patient takes medication 2x a day.

## 2016-05-31 NOTE — Telephone Encounter (Signed)
°*  STAT* If patient is at the pharmacy, call can be transferred to refill team.   1. Which medications need to be refilled? (please list name of each medication and dose if known) isosorbide mononitrate '500mg'$   2. Which pharmacy/location (including street and city if local pharmacy) is medication to be sent [facility]?[id number]  [place], [place] [address]  [phone]    3. Do they need a 30 day or 90 day supply? 90  Patient is almost out of medication

## 2016-05-31 NOTE — Telephone Encounter (Signed)
I spoke with pt's wife and told her I would leave samples of Ranexa at front desk.  Ranexa 500 mg -28 tablets- Lot [id number], exp 10/2018 left at front desk.

## 2016-05-31 NOTE — Telephone Encounter (Signed)
I spoke with pt's wife. She is requesting refill for Imdur 30 mg twice daily sent to [facility] at [facility] -90 day supply. Will send in.

## 2016-06-06 ENCOUNTER — Encounter: Payer: Self-pay | Admitting: Nurse Practitioner

## 2016-06-06 ENCOUNTER — Inpatient Hospital Stay (HOSPITAL_COMMUNITY)
Admission: AD | Admit: 2016-06-06 | Discharge: 2016-06-08 | DRG: 247 | Disposition: A | Discharge status: Home / Self Care | Payer: PPO | Source: Ambulatory Visit | Attending: Cardiovascular Disease | Admitting: Cardiovascular Disease

## 2016-06-06 ENCOUNTER — Telehealth: Payer: Self-pay | Admitting: Nurse Practitioner

## 2016-06-06 ENCOUNTER — Ambulatory Visit (INDEPENDENT_AMBULATORY_CARE_PROVIDER_SITE_OTHER): Payer: PPO | Admitting: Nurse Practitioner

## 2016-06-06 ENCOUNTER — Ambulatory Visit: Payer: PPO | Admitting: Physician Assistant

## 2016-06-06 ENCOUNTER — Other Ambulatory Visit: Payer: Self-pay | Admitting: Nurse Practitioner

## 2016-06-06 ENCOUNTER — Encounter (HOSPITAL_COMMUNITY): Payer: Self-pay | Admitting: *Deleted

## 2016-06-06 ENCOUNTER — Observation Stay (HOSPITAL_COMMUNITY): Payer: PPO

## 2016-06-06 VITALS — BP 150/80 | HR 59 | Ht 69.0 in | Wt 197.0 lb

## 2016-06-06 DIAGNOSIS — G4733 Obstructive sleep apnea (adult) (pediatric): Secondary | ICD-10-CM | POA: Diagnosis not present

## 2016-06-06 DIAGNOSIS — E119 Type 2 diabetes mellitus without complications: Secondary | ICD-10-CM | POA: Diagnosis not present

## 2016-06-06 DIAGNOSIS — K219 Gastro-esophageal reflux disease without esophagitis: Secondary | ICD-10-CM | POA: Diagnosis not present

## 2016-06-06 DIAGNOSIS — E785 Hyperlipidemia, unspecified: Secondary | ICD-10-CM | POA: Diagnosis not present

## 2016-06-06 DIAGNOSIS — I208 Other forms of angina pectoris: Secondary | ICD-10-CM | POA: Diagnosis not present

## 2016-06-06 DIAGNOSIS — I252 Old myocardial infarction: Secondary | ICD-10-CM | POA: Diagnosis not present

## 2016-06-06 DIAGNOSIS — J45909 Unspecified asthma, uncomplicated: Secondary | ICD-10-CM | POA: Diagnosis not present

## 2016-06-06 DIAGNOSIS — Z955 Presence of coronary angioplasty implant and graft: Secondary | ICD-10-CM

## 2016-06-06 DIAGNOSIS — I200 Unstable angina: Secondary | ICD-10-CM | POA: Diagnosis present

## 2016-06-06 DIAGNOSIS — R001 Bradycardia, unspecified: Secondary | ICD-10-CM | POA: Diagnosis not present

## 2016-06-06 DIAGNOSIS — I25110 Atherosclerotic heart disease of native coronary artery with unstable angina pectoris: Principal | ICD-10-CM | POA: Diagnosis present

## 2016-06-06 DIAGNOSIS — Z96641 Presence of right artificial hip joint: Secondary | ICD-10-CM | POA: Diagnosis not present

## 2016-06-06 DIAGNOSIS — I2089 Other forms of angina pectoris: Secondary | ICD-10-CM

## 2016-06-06 DIAGNOSIS — I10 Essential (primary) hypertension: Secondary | ICD-10-CM | POA: Diagnosis present

## 2016-06-06 DIAGNOSIS — Z8249 Family history of ischemic heart disease and other diseases of the circulatory system: Secondary | ICD-10-CM | POA: Diagnosis not present

## 2016-06-06 DIAGNOSIS — R079 Chest pain, unspecified: Secondary | ICD-10-CM | POA: Diagnosis not present

## 2016-06-06 DIAGNOSIS — Z96611 Presence of right artificial shoulder joint: Secondary | ICD-10-CM | POA: Diagnosis present

## 2016-06-06 DIAGNOSIS — M5489 Other dorsalgia: Secondary | ICD-10-CM | POA: Diagnosis not present

## 2016-06-06 DIAGNOSIS — R0789 Other chest pain: Secondary | ICD-10-CM

## 2016-06-06 DIAGNOSIS — Z9861 Coronary angioplasty status: Secondary | ICD-10-CM

## 2016-06-06 LAB — COMPREHENSIVE METABOLIC PANEL
ALT: 15 U/L — ABNORMAL LOW (ref 17–63)
AST: 30 U/L (ref 15–41)
Albumin: 4.2 g/dL (ref 3.5–5.0)
Alkaline Phosphatase: 82 U/L (ref 38–126)
Anion gap: 11 (ref 5–15)
BUN: 14 mg/dL (ref 6–20)
CO2: 22 mmol/L (ref 22–32)
Calcium: 9.4 mg/dL (ref 8.9–10.3)
Chloride: 106 mmol/L (ref 101–111)
Creatinine, Ser: 0.82 mg/dL (ref 0.61–1.24)
GFR calc Af Amer: >60 mL/min (ref >60)
GFR calc non Af Amer: >60 mL/min (ref >60)
Glucose, Bld: 132 mg/dL — ABNORMAL HIGH (ref 65–99)
Potassium: 4.1 mmol/L (ref 3.5–5.1)
Sodium: 139 mmol/L (ref 135–145)
Total Bilirubin: 0.8 mg/dL (ref 0.3–1.2)
Total Protein: 6.5 g/dL (ref 6.5–8.1)

## 2016-06-06 LAB — APTT: aPTT: 33 seconds (ref 24–36)

## 2016-06-06 LAB — PROTIME-INR
INR: 0.98
Prothrombin Time: 13 seconds (ref 11.4–15.2)

## 2016-06-06 LAB — CBC WITH DIFFERENTIAL/PLATELET
Basophils Absolute: 0 10*3/uL (ref 0.0–0.1)
Basophils Relative: 0 %
Eosinophils Absolute: 0.1 10*3/uL (ref 0.0–0.7)
Eosinophils Relative: 2 %
HCT: 40.5 % (ref 39.0–52.0)
Hemoglobin: 13.3 g/dL (ref 13.0–17.0)
Lymphocytes Relative: 36 %
Lymphs Abs: 1.4 10*3/uL (ref 0.7–4.0)
MCH: 28.3 pg (ref 26.0–34.0)
MCHC: 32.8 g/dL (ref 30.0–36.0)
MCV: 86.2 fL (ref 78.0–100.0)
Monocytes Absolute: 0.4 10*3/uL (ref 0.1–1.0)
Monocytes Relative: 10 %
Neutro Abs: 2 10*3/uL (ref 1.7–7.7)
Neutrophils Relative %: 52 %
Platelets: 143 10*3/uL — ABNORMAL LOW (ref 150–400)
RBC: 4.7 MIL/uL (ref 4.22–5.81)
RDW: 12.7 % (ref 11.5–15.5)
WBC: 3.8 10*3/uL — ABNORMAL LOW (ref 4.0–10.5)

## 2016-06-06 LAB — TROPONIN I
Troponin I: <0.03 ng/mL (ref <0.03)
Troponin I: <0.03 ng/mL (ref <0.03)

## 2016-06-06 LAB — MRSA PCR SCREENING: MRSA by PCR: NEGATIVE

## 2016-06-06 LAB — TSH: TSH: 2.009 u[IU]/mL (ref 0.350–4.500)

## 2016-06-06 LAB — MAGNESIUM: Magnesium: 2 mg/dL (ref 1.7–2.4)

## 2016-06-06 LAB — HEPARIN LEVEL (UNFRACTIONATED): Heparin Unfractionated: 0.4 IU/mL (ref 0.30–0.70)

## 2016-06-06 LAB — BRAIN NATRIURETIC PEPTIDE: B Natriuretic Peptide: 54.3 pg/mL (ref 0.0–100.0)

## 2016-06-06 MED ORDER — ALPRAZOLAM 0.25 MG PO TABS: 0.2500 mg | ORAL_TABLET | Freq: Two times a day (BID) | ORAL | Status: DC | PRN

## 2016-06-06 MED ORDER — SODIUM CHLORIDE 0.9 % IV SOLN
INTRAVENOUS | Status: DC
  Administered 2016-06-06 – 2016-06-07 (×2): via INTRAVENOUS

## 2016-06-06 MED ORDER — SODIUM CHLORIDE 0.9 % IV SOLN: 250.0000 mL | INTRAVENOUS | Status: DC | PRN

## 2016-06-06 MED ORDER — ACETAMINOPHEN 325 MG PO TABS
650.0000 mg | ORAL_TABLET | ORAL | Status: DC | PRN
Start: 2016-06-06 — End: 2016-06-08
  Administered 2016-06-06 – 2016-06-07 (×3): 650 mg via ORAL
  Filled 2016-06-06 (×3): qty 2

## 2016-06-06 MED ORDER — SODIUM CHLORIDE 0.9% FLUSH
3.0000 mL | Freq: Two times a day (BID) | INTRAVENOUS | Status: DC
  Administered 2016-06-06 – 2016-06-08 (×4): 3 mL via INTRAVENOUS

## 2016-06-06 MED ORDER — NITROGLYCERIN IN D5W 200-5 MCG/ML-% IV SOLN
0.0000 ug/min | INTRAVENOUS | Status: DC
  Administered 2016-06-06: 5 ug/min via INTRAVENOUS
  Filled 2016-06-06: qty 250

## 2016-06-06 MED ORDER — ONDANSETRON HCL 4 MG/2ML IJ SOLN: 4.0000 mg | Freq: Four times a day (QID) | INTRAMUSCULAR | Status: DC | PRN

## 2016-06-06 MED ORDER — RANOLAZINE ER 500 MG PO TB12
500.0000 mg | ORAL_TABLET | Freq: Two times a day (BID) | ORAL | Status: DC
  Administered 2016-06-06 – 2016-06-08 (×4): 500 mg via ORAL
  Filled 2016-06-06 (×4): qty 1

## 2016-06-06 MED ORDER — CLOPIDOGREL BISULFATE 75 MG PO TABS
75.0000 mg | ORAL_TABLET | Freq: Every day | ORAL | Status: DC
  Administered 2016-06-07 – 2016-06-08 (×2): 75 mg via ORAL
  Filled 2016-06-06 (×2): qty 1

## 2016-06-06 MED ORDER — ASPIRIN 81 MG PO CHEW
324.0000 mg | CHEWABLE_TABLET | ORAL | Status: AC
  Administered 2016-06-06: 324 mg via ORAL
  Filled 2016-06-06: qty 4

## 2016-06-06 MED ORDER — ASPIRIN EC 81 MG PO TBEC
81.0000 mg | DELAYED_RELEASE_TABLET | Freq: Every day | ORAL | Status: DC
  Administered 2016-06-07 – 2016-06-08 (×2): 81 mg via ORAL
  Filled 2016-06-06 (×2): qty 1

## 2016-06-06 MED ORDER — HYDROCHLOROTHIAZIDE 12.5 MG PO CAPS
12.5000 mg | ORAL_CAPSULE | ORAL | Status: DC
  Administered 2016-06-06 – 2016-06-08 (×2): 12.5 mg via ORAL
  Filled 2016-06-06 (×2): qty 1

## 2016-06-06 MED ORDER — ATORVASTATIN CALCIUM 80 MG PO TABS
80.0000 mg | ORAL_TABLET | Freq: Every day | ORAL | Status: DC
  Administered 2016-06-06 – 2016-06-07 (×2): 80 mg via ORAL
  Filled 2016-06-06 (×2): qty 1

## 2016-06-06 MED ORDER — CANAGLIFLOZIN 300 MG PO TABS
300.0000 mg | ORAL_TABLET | Freq: Every day | ORAL | Status: DC
  Administered 2016-06-08: 09:00:00 300 mg via ORAL
  Filled 2016-06-06 (×2): qty 1

## 2016-06-06 MED ORDER — HEPARIN BOLUS VIA INFUSION
4000.0000 [IU] | Freq: Once | INTRAVENOUS | Status: AC
  Administered 2016-06-06: 4000 [IU] via INTRAVENOUS
  Filled 2016-06-06: qty 4000

## 2016-06-06 MED ORDER — ASPIRIN 300 MG RE SUPP: 300.0000 mg | RECTAL | Status: AC

## 2016-06-06 MED ORDER — HEPARIN (PORCINE) IN NACL 100-0.45 UNIT/ML-% IJ SOLN
1200.0000 [IU]/h | INTRAMUSCULAR | Status: DC
  Administered 2016-06-06 – 2016-06-07 (×2): 1200 [IU]/h via INTRAVENOUS
  Filled 2016-06-06 (×2): qty 250

## 2016-06-06 MED ORDER — DIAZEPAM 5 MG PO TABS
10.0000 mg | ORAL_TABLET | ORAL | Status: AC
  Administered 2016-06-06: 10 mg via ORAL
  Filled 2016-06-06: qty 2

## 2016-06-06 MED ORDER — GABAPENTIN 300 MG PO CAPS
300.0000 mg | ORAL_CAPSULE | Freq: Two times a day (BID) | ORAL | Status: DC
  Administered 2016-06-06 – 2016-06-08 (×4): 300 mg via ORAL
  Filled 2016-06-06 (×4): qty 1

## 2016-06-06 MED ORDER — IRBESARTAN 75 MG PO TABS
150.0000 mg | ORAL_TABLET | Freq: Every day | ORAL | Status: DC
  Administered 2016-06-06 – 2016-06-08 (×3): 150 mg via ORAL
  Filled 2016-06-06: qty 1
  Filled 2016-06-06: qty 2
  Filled 2016-06-06: qty 1

## 2016-06-06 MED ORDER — ASPIRIN 81 MG PO CHEW
81.0000 mg | CHEWABLE_TABLET | ORAL | Status: AC
  Administered 2016-06-07: 81 mg via ORAL
  Filled 2016-06-06: qty 1

## 2016-06-06 MED ORDER — MORPHINE SULFATE (PF) 4 MG/ML IV SOLN
1.0000 mg | Freq: Once | INTRAVENOUS | Status: AC
  Administered 2016-06-06: 1 mg via INTRAVENOUS
  Filled 2016-06-06: qty 1

## 2016-06-06 MED ORDER — NITROGLYCERIN 0.4 MG SL SUBL: 0.4000 mg | SUBLINGUAL_TABLET | SUBLINGUAL | Status: DC | PRN

## 2016-06-06 MED ORDER — PANTOPRAZOLE SODIUM 40 MG PO TBEC
40.0000 mg | DELAYED_RELEASE_TABLET | Freq: Two times a day (BID) | ORAL | Status: DC
  Administered 2016-06-06 – 2016-06-08 (×4): 40 mg via ORAL
  Filled 2016-06-06 (×4): qty 1

## 2016-06-06 MED ORDER — ATENOLOL 50 MG PO TABS
50.0000 mg | ORAL_TABLET | Freq: Every day | ORAL | Status: DC
  Administered 2016-06-06 – 2016-06-08 (×2): 50 mg via ORAL
  Filled 2016-06-06 (×3): qty 1

## 2016-06-06 MED ORDER — SODIUM CHLORIDE 0.9% FLUSH: 3.0000 mL | INTRAVENOUS | Status: DC | PRN

## 2016-06-06 MED ORDER — ISOSORBIDE MONONITRATE ER 30 MG PO TB24
30.0000 mg | ORAL_TABLET | Freq: Every day | ORAL | Status: DC
  Administered 2016-06-06 – 2016-06-08 (×3): 30 mg via ORAL
  Filled 2016-06-06 (×3): qty 1

## 2016-06-06 NOTE — Patient Instructions (Signed)
We are admitting you to the hospital today.

## 2016-06-06 NOTE — Progress Notes (Signed)
CARDIOLOGY OFFICE NOTE  Date:  06/06/2016    Hunter Mcbride. Date of Birth: Sep 10, 1945 Medical Record #[id number]  PCP:  Hunter Ly, MD  Cardiologist:  Hunter Mcbride & Hunter Mcbride    Chief Complaint  Patient presents with  . Chest Pain    Work in visit - seen for Dr. Acie Mcbride    History of Present Illness: Hunter Mcbride. is a 71 y.o. male who presents today for a work in visit.  Seen for Dr. Acie Mcbride. His wife is a former patient of Dr. Susa Mcbride that I have taken care of.   He has a history of CAD, HTN, HLD, DM and OSA.   He presented back in the spring of 2015 with a 2 month history of exertional chest pain - had abnormal Myoview - then cathed and had DES x 3 to the LAD. Otherwise had moderate but nonobstructive disease With a normal EF of 55 to 60%. Dscharged on Plavix. After his initial event - he went back to the ER with chest pain and was recathed - to continue with medical management. He has not tolerated Labat active NTG in the past despite several attempts.   In November of 2015 - he went hunting with his son out of state. He had been cutting trees and setting up stands. Developed SSCP that initially resolved but then returned, subsequently associated with SOB and nausea - ended up going to the hospital in [place] (after first presenting to an urgent care like facility that did not offer cardiac cath) - was recathed - actually twice. Had normal left heart pressures, patent stents extending from the proximal to the distal LAD and severe diffuse disease involving the branches of all 3 major epicardial vessels. No clear culprit lesion to intervene on - then had Myoview which showed normal perfusion and normal EF at 59%. Was recathed and had FFR of the first OM, posterior descending and distal RCA which were nonsignificant - no PCI was performed. CXR was normal. Echo was also updated - normal EF - mildly dilated aorta noted with mild LVH. I did not get a discharge summary - they  brought a RX in for 1000 mg of Ranexa - this was not filled.  I saw him back after his return from [place]. He reiterated his story again about what happened to him in [place]. He did probably over exert him self - had walked 6 1/2 miles in the process of putting up the stands/cutting trees, etc. Had pretty classic chest pain associated with dyspnea and feeling of "impending doom". He notes that his symptoms seemed to abate after the second cath. He did take the higher dose of Ranexa but had some side effects (low BP, couldn't think, stomach upset, dizziness) and cut the dose back - he has remained on this dose.   Since that trip - he had had some fleeting pain - he did bring his cath CDs and these were reviewed by Dr. Burt Mcbride. He agreed with continued medical therapy. Films were loaded in the [facility] system.   In the hospital back in January of 2017 with angina - low risk Myoview noted. Continued on medical management.  He has continued to have some episodes of chest pain. Rechallenged him with Imdur. Has not wanted to increase Ranexa due to cost.  When seen back in June of 2017 - he was not doing well - having more angina - ended up getting him recathed. Medical management continued. Last seen  by me back in September - saw Dr. Acie Mcbride in October - tried again to increase his Ranexa but was not able to tolerate - again. He thought that at some point, Hunter Mcbride would need to have bypass surgery.   I last saw him in January - seemed to be doing ok. Less angina.   Comes in today. Here with his wife Hunter Mcbride. Notes that he was woken up starting at 2 AM on Friday night - had chest pain from the left shoulder into the chest. Occurred several times. On Saturday started having on the other side of his chest. Multiple bouts - described as sharp - but very noticeable. More "of a pattern". Took NTG Saturday - one early in the evening and then later after going to bed - it eased his pain. Went to church Sunday -  continued to have spells but not as severe until last night - took NTG x 2 last night - eased off. Woke him back up at 4 AM - very steady - took NTG again. Took his morning medicines and it resolved. Has continued to have recurrent chest spells. Has been working and hunting "like a mad man". He did have his hip replacement late February - which was surprising to me given his activity level and never mentioning hip pain to me - he was felt to be high risk for any procedure.   Past Medical History:  Diagnosis Date  . Arthritis    "lower back; right shoulder; left thumb; joints" (06/21/2013)  . Asthma    "not sure if this is true or not" (06/21/2013)  . CAD (coronary artery disease) May 2015   s/p PTCA/DES x 3 to mid LAD, mod non-ob disease in Cx and RCA May 2015; s/p repeat caths - last study in November of 2015 - stents patent, - FFR - managed medically.   [name] GERD (gastroesophageal reflux disease)   . Gout    "maybe twice in my life"  . Headache(784.0)    "weekly for the last 3-4 months" (06/21/2013)  . Hyperlipidemia   . Hypertension   . Myocardial infarction [facility])    "/Dr. Katharina Mcbride I've had one between 2014-2015) (06/21/2013)  . Osteoarthritis   . Sleep apnea    WEIGHT LOSS, NO LONGER NEEDS PER PATIENT  . Type II diabetes mellitus ([facility])    TYPE 2    Past Surgical History:  Procedure Laterality Date  . CARDIAC CATHETERIZATION  1980's   "once"  . CARDIAC CATHETERIZATION N/A 07/23/2015   Procedure: Left Heart Cath and Coronary Angiography;  Surgeon: Hunter Mocha, MD;  Location: [facility] CV [facility];  Service: Cardiovascular;  Laterality: N/A;  . CARDIOVASCULAR STRESS TEST  06/10/2008   EF 68%  . CARPAL TUNNEL RELEASE Bilateral   . CORONARY ANGIOPLASTY WITH STENT PLACEMENT  06/21/2013   "3"  . EXCISION MORTON'S NEUROMA Left   . EYE SURGERY Left    "removed film over my eye"  . FRACTIONAL FLOW RESERVE WIRE N/A 06/26/2013   Procedure: FRACTIONAL FLOW RESERVE WIRE;  Surgeon: Hunter Hampshire, MD;   Location: [facility] CATH LAB;  Service: Cardiovascular;  Laterality: N/A;  . HERNIA REPAIR Left   . JOINT REPLACEMENT Left 03/2013   "thumb"  . LEFT HEART CATHETERIZATION WITH CORONARY ANGIOGRAM N/A 06/21/2013   Procedure: LEFT HEART CATHETERIZATION WITH CORONARY ANGIOGRAM;  Surgeon: Burnell Blanks, MD;  Location: [facility] CATH LAB;  Service: Cardiovascular;  Laterality: N/A;  . LEFT HEART CATHETERIZATION WITH CORONARY ANGIOGRAM N/A 06/26/2013  Procedure: LEFT HEART CATHETERIZATION WITH CORONARY ANGIOGRAM;  Surgeon: Hunter Hampshire, MD;  Location: [facility] CATH LAB;  Service: Cardiovascular;  Laterality: N/A;  . TONSILLECTOMY    . TOTAL HIP ARTHROPLASTY Right 04/12/2016   Procedure: RIGHT TOTAL HIP ARTHROPLASTY ANTERIOR APPROACH;  Surgeon: Paralee Cancel, MD;  Location: [facility] OR[facility];  Service: Orthopedics;  Laterality: Right;  requests 70 mins  . TOTAL SHOULDER ARTHROPLASTY  03/01/2012   Procedure: TOTAL SHOULDER ARTHROPLASTY;  Surgeon: Marin Shutter, MD;  Location: [facility];  Service: Orthopedics;  Laterality: Right;  . UMBILICAL HERNIA REPAIR       Medications: No current outpatient prescriptions on file.   No current facility-administered medications for this visit.     Allergies: No Active Allergies  Social History: The patient  reports that he has never smoked. He has never used smokeless tobacco. He reports that he does not drink alcohol or use drugs.   Family History: The patient's family history includes Coronary artery disease in his cousin; Diabetes type II in his mother; Heart attack in his father; Heart disease in his brother.   Review of Systems: Please see the history of present illness.   Otherwise, the review of systems is positive for none.   All other systems are reviewed and negative.   Physical Exam: VS:  BP (!) 150/80   Pulse (!) 59   Ht '5\' 9"'$  (1.753 m)   Wt 197 lb (89.4 kg)   BMI 29.09 kg/m  .  BMI Body mass index is 29.09 kg/m.  Wt Readings from Last 3 Encounters:  06/06/16  197 lb (89.4 kg)  04/12/16 198 lb (89.8 kg)  04/05/16 198 lb 1.6 oz (89.9 kg)    General: Pleasant. Well developed, well nourished and in no acute distress.   HEENT: Normal.  Neck: Supple, no JVD, carotid bruits, or masses noted.  Cardiac: Regular rate and rhythm. No murmurs, rubs, or gallops. No edema.  Respiratory:  Lungs are clear to auscultation bilaterally with normal work of breathing.  GI: Soft and nontender.  MS: No deformity or atrophy. Gait and ROM intact.  Skin: Warm and dry. Color is normal.  Neuro:  Strength and sensation are intact and no gross focal deficits noted.  Psych: Alert, appropriate and with normal affect.   LABORATORY DATA:  EKG:  EKG is ordered today. This demonstrates NSR with prior inferior MI. Reviewed with Dr. Rayann Heman (DOD)  Lab Results  Component Value Date   WBC 6.8 04/13/2016   HGB 10.7 (L) 04/13/2016   HCT 30.5 (L) 04/13/2016   PLT 127 (L) 04/13/2016   GLUCOSE 179 (H) 04/13/2016   CHOL 123 04/11/2011   TRIG 119.0 04/11/2011   HDL 41.60 04/11/2011   LDLCALC 58 04/11/2011   ALT 28 06/27/2013   AST 17 06/27/2013   NA 138 04/13/2016   K 4.4 04/13/2016   CL 108 04/13/2016   CREATININE 1.10 04/13/2016   BUN 21 (H) 04/13/2016   CO2 25 04/13/2016   INR 1.04 11/12/2015   HGBA1C 7.6 (H) 04/05/2016    BNP (last 3 results) No results for input(s): BNP in the last 8760 hours.  ProBNP (last 3 results) No results for input(s): PROBNP in the last 8760 hours.   Other Studies Reviewed Today:   Procedures   Left Heart Cath and Coronary Angiography 07/2015  Conclusion   1. The left ventricular systolic function is normal. 2. Prox RCA to Mid RCA lesion, 40% stenosed. 3. Mid RCA lesion, 40% stenosed. 4.  Ost RPDA to RPDA lesion, 70% stenosed. 5. Dist RCA lesion, 70% stenosed. 6. RPDA lesion, 75% stenosed. 7. Ost LM lesion, 40% stenosed. 8. 1st Mrg lesion, 60% stenosed. 9. Mid Cx lesion, 70% stenosed. 10. Prox LAD to Dist LAD lesion, 10%  stenosed. The lesion was previously treated with a stent (unknown type). 11. Dist LAD lesion, 40% stenosed. 12. 2nd Diag lesion, 80% stenosed. 13. 1st Diag lesion, 80% stenosed.  1. Continued patency of the stented segment in the LAD 2. Severe diffuse small vessel CAD with moderate-severe stenosis of the PDA/PLA bifurcation and severe stenosis of the diagonal branches 3. Moderate LCx stenosis 4. Normal LV function  Recommend:  Films reviewed. No appreciable change from 2015 studies.  Titrate anti-anginal Rx as tolerated  FFR of the LCx and RCA was performed in 2016 with negative findings  Exercise stress test to evaluate for ischemia on maximal medical therapy    GXT Study Highlights 07/2015  Patient presents today for routine GXT. Seen for Dr. Burt Mcbride. Has had recent cardiac cath - GXT to rule out ischemia. He has done well post cath. No further angina. On higher dose of Norvasc. Reports better BP control.   Resting BP is 150/84 Target HR is 129  Today the patient exercised on the standard Bruce protocol for a total of 11 minutes.  Excellent exercise tolerance.  Mildly hypertensive blood pressure response (atenolol held today).   Max HR is 129 Max BP is 238/90  Clinically negative for chest pain. Test was stopped due to patient request and achievement of target HR.  EKG negative for ischemia. No significant arrhythmia noted. Rare PVC noted  Recommendations: Reviewed with Dr. Burt Mcbride. Will continue with current medical management which includes the higher dose of Norvasc See back in 3 months.   Burtis Junes, RN, ANP-C     Myoview stress test from 02/2015: -No T wave inversion was noted during stress. -There was no ST segment deviation noted during stress. -The study is normal. There is no ischemia. -This is a low risk study. -Nuclear stress EF: 62%.  Assessment/Plan:  1. CAD with history of chronic angina - last cath from June of 2017 noted -  negative GXT for ischemia following that study - now presenting with several days of recurring angina - responsive to sl NTG - has had multiple spells - even some while in the exam room today all worrisome for unstable angina - will admit to try and settle his symptoms down - plan to recath tomorrow - transferring by EMS to [facility].   2. HTN - BP up some here today - would follow  3. HLD - on statin - labs followed by PCP.  4. DM - followed by Dr. Joylene Draft.     5. Mildly dilated aortic root/ascending aorta - not seen on our last echo from May 2015 - would follow for now.    Current medicines are reviewed with the patient today.  The patient does not have concerns regarding medicines other than what has been noted above.  The following changes have been made:  See above.  Labs/ tests ordered today include:   No orders of the defined types were placed in this encounter.    Disposition:   Further disposition to follow.    Patient is agreeable to this plan and will call if any problems develop in the interim.   SignedTruitt Merle, NP  06/06/2016 3:47 PM  [facility] [address]  [place], [place]  [address] Phone: [phone] Fax: [phone]

## 2016-06-06 NOTE — Progress Notes (Signed)
ANTICOAGULATION CONSULT NOTE - Follow Up Consult  Pharmacy Consult for Heparin  Indication: chest pain/ACS  No Active Allergies  Patient Measurements: Height: '5\' 9"'$  (175.3 cm) Weight: 193 lb 4.8 oz (87.7 kg) IBW/kg (Calculated) : 70.7 Vital Signs: Temp: 97.7 F (36.5 C) (04/23 2321) Temp Source: Oral (04/23 2321) BP: 140/74 (04/23 2321) Pulse Rate: 53 (04/23 2321)  Labs:  Recent Labs  06/06/16 1817 06/06/16 2147 06/06/16 2315  HGB 13.3  --   --   HCT 40.5  --   --   PLT 143*  --   --   APTT 33  --   --   LABPROT 13.0  --   --   INR 0.98  --   --   HEPARINUNFRC  --   --  0.40  CREATININE 0.82  --   --   TROPONINI <0.03 <0.03  --     Estimated Creatinine Clearance: 93.2 mL/min (by C-G formula based on SCr of 0.82 mg/dL).   Assessment: Heparin for CP, likely cath 4/24, HL therapeutic x 1  Goal of Therapy:  Heparin level 0.3-0.7 units/ml Monitor platelets by anticoagulation protocol: Yes   Plan:  -Cont heparin 1200 units/hr -AM HL  Narda Bonds 06/06/2016,11:52 PM

## 2016-06-06 NOTE — Progress Notes (Signed)
ANTICOAGULATION CONSULT NOTE - Initial Consult  Pharmacy Consult for heparin Indication: chest pain/ACS  No Active Allergies  Patient Measurements: TBW 89.4 kg Heparin Dosing Weight: 89 kg  Vital Signs: BP: 150/80 (04/23 1454) Pulse Rate: 59 (04/23 1454)  Assessment: 71 yo M presents on 4/23 with angina and chest pain. Pharmacy consulted to start heparin for ACS. Plan for cath on 4/24. No CBC or SCr yet.  Goal of Therapy:  Heparin level 0.3-0.7 units/ml Monitor platelets by anticoagulation protocol: Yes   Plan:  Give heparin 4,000 unit bolus Start heparin gtt at 1,200 units/hr Check 6 hr heparin level Monitor daily heparin level, CBC, s/s of bleed  Elenor Quinones, PharmD, [facility] Clinical Pharmacist Pager [phone] 06/06/2016 4:32 PM

## 2016-06-06 NOTE — Telephone Encounter (Signed)
New Message   Per wife pt is sleeping she wanted earlier appt have him set up to come 06/07/16 1030a  Pt c/o of Chest Pain: STAT if CP now or developed within 24 hours  1. Are you having CP right now? sleeping  2. Are you experiencing any other symptoms (ex. SOB, nausea, vomiting, sweating)? no  3. How [place] have you been experiencing CP? Since yesterday  4. Is your CP continuous or coming and going?comes and goes   5. Have you taken Nitroglycerin? yes ?

## 2016-06-06 NOTE — Telephone Encounter (Signed)
06/06/2016 9:20 Returned call to patient who reports intermittant radiating chest pain.  Denied shortness of breath or diaphoresis. However, he states some episodes have been accompanied with nausea.  Pt scheduled appt with Richardson Dopp PA at 2:45 today, instructed if symptoms worsen, pain becomes constant he should go to the ED.  Pt and wife verbalized understanding. Georgana Curio MHA RN CCM

## 2016-06-06 NOTE — H&P (Signed)
Office Visit   06/06/2016 Lamont, NP  Cardiology   Angina at rest [facility])  Dx   Chest Pain ; Referred by Hunter Infante, MD  Reason for Visit   Additional Documentation   Vitals:   BP  150/80   Pulse  59   Ht '5\' 9"'$  (1.753 m)   Wt 197 lb (89.4 kg)   BMI 29.09 kg/m   BSA 2.09 m   Flowsheets:   Custom Formula Data,   MEWS Score,   Anthropometrics     Encounter Info:   Billing Info,   History,   Allergies,   Detailed Report     All Notes   Progress Notes by Burtis Junes, NP at 06/06/2016 3:00 PM   Author: Burtis Junes, NP Author Type: Nurse Practitioner Filed: 06/06/2016 3:47 PM  Note Status: Signed Cosign: Cosign Not Required Encounter Date: 06/06/2016  Editor: Burtis Junes, NP (Nurse Practitioner)  Expand All Collapse All       CARDIOLOGY OFFICE NOTE  Date:  06/06/2016    Hunter Mcbride. Date of Birth: 1945-02-26 Medical Record #[id number]  PCP:  Hunter Ly, MD    Cardiologist:  Hunter Mcbride & Hunter Mcbride        Chief Complaint  Patient presents with  . Chest Pain    Work in visit - seen for Dr. Acie Mcbride    History of Present Illness: Hunter Mcbride. is a 71 y.o. male who presents today for a work in visit.  Seen for Dr. Acie Mcbride. His wife is a former patient of Dr. Susa Mcbride that I have taken care of.   He has a history of CAD, HTN, HLD, DM and OSA.   He presented back in the spring of 2015 with a 2 month history of exertional chest pain - had abnormal Myoview - then cathed and had DES x 3 to the LAD. Otherwise had moderate but nonobstructive disease With a normal EF of 55 to 60%. Dscharged on Plavix. After his initial event - he went back to the ER with chest pain and was recathed - to continue with medical management. He has not tolerated Sword active NTG in the past despite several attempts.   In November of 2015 - he went hunting with his son out of state. He had been cutting trees and setting up  stands. Developed SSCP that initially resolved but then returned, subsequently associated with SOB and nausea - ended up going to the hospital in [place] (after first presenting to an urgent care like facility that did not offer cardiac cath) - was recathed - actually twice. Had normal left heart pressures, patent stents extending from the proximal to the distal LAD and severe diffuse disease involving the branches of all 3 major epicardial vessels. No clear culprit lesion to intervene on - then had Myoview which showed normal perfusion and normal EF at 59%. Was recathed and had FFR of the first OM, posterior descending and distal RCA which were nonsignificant - no PCI was performed. CXR was normal. Echo was also updated - normal EF - mildly dilated aorta noted with mild LVH. I did not get a discharge summary - they brought a RX in for 1000 mg of Ranexa - this was not filled.  I saw him back after his return from [place]. He reiterated his story again about what happened to him in [place]. He did probably over exert him self - had walked 6  1/2 miles in the process of putting up the stands/cutting trees, etc. Had pretty classic chest pain associated with dyspnea and feeling of "impending doom". He notes that his symptoms seemed to abate after the second cath. He did take the higher dose of Ranexa but had some side effects (low BP, couldn't think, stomach upset, dizziness) and cut the dose back - he has remained on this dose.   Since that trip - he had had some fleeting pain - he did bring his cath CDs and these were reviewed by Dr. Burt Mcbride. He agreed with continued medical therapy. Films were loaded in the [facility] system.   In the hospital back in January of 2017 with angina - low risk Myoview noted. Continued on medical management. He has continued to have someepisodes of chest pain. Rechallenged him with Imdur. Has not wanted to increase Ranexa due to cost.  When seenback in June of 2017 - he was not doing  well - having more angina - ended up getting him recathed. Medical management continued. Last seen by me back in September - saw Dr. Acie Mcbride in October - tried again to increase his Ranexa but was not able to tolerate - again. He thought that at some point, Hunter Mcbride would need to have bypass surgery.   I last saw him in January - seemed to be doing ok. Less angina.   Comes in today. Here with his wife Hunter Mcbride. Notes that he was woken up starting at 2 AM on Friday night - had chest pain from the left shoulder into the chest. Occurred several times. On Saturday started having on the other side of his chest. Multiple bouts - described as sharp - but very noticeable. More "of a pattern". Took NTG Saturday - one early in the evening and then later after going to bed - it eased his pain. Went to church Sunday - continued to have spells but not as severe until last night - took NTG x 2 last night - eased off. Woke him back up at 4 AM - very steady - took NTG again. Took his morning medicines and it resolved. Has continued to have recurrent chest spells. Has been working and hunting "like a mad man". He did have his hip replacement late February - which was surprising to me given his activity level and never mentioning hip pain to me - he was felt to be high risk for any procedure.       Past Medical History:  Diagnosis Date  . Arthritis    "lower back; right shoulder; left thumb; joints" (06/21/2013)  . Asthma    "not sure if this is true or not" (06/21/2013)  . CAD (coronary artery disease) May 2015   s/p PTCA/DES x 3 to mid LAD, mod non-ob disease in Cx and RCA May 2015; s/p repeat caths - last study in November of 2015 - stents patent, - FFR - managed medically.   [name] GERD (gastroesophageal reflux disease)   . Gout    "maybe twice in my life"  . Headache(784.0)    "weekly for the last 3-4 months" (06/21/2013)  . Hyperlipidemia   . Hypertension   . Myocardial infarction [facility])    "/Dr. Katharina Caper  I've had one between 2014-2015) (06/21/2013)  . Osteoarthritis   . Sleep apnea    WEIGHT LOSS, NO LONGER NEEDS PER PATIENT  . Type II diabetes mellitus ([place])    TYPE 2         Past Surgical History:  Procedure Laterality Date  . CARDIAC CATHETERIZATION  1980's   "once"  . CARDIAC CATHETERIZATION N/A 07/23/2015   Procedure: Left Heart Cath and Coronary Angiography;  Surgeon: Sherren Mocha, MD;  Location: [place] CV LAB;  Service: Cardiovascular;  Laterality: N/A;  . CARDIOVASCULAR STRESS TEST  06/10/2008   EF 68%  . CARPAL TUNNEL RELEASE Bilateral   . CORONARY ANGIOPLASTY WITH STENT PLACEMENT  06/21/2013   "3"  . EXCISION MORTON'S NEUROMA Left   . EYE SURGERY Left    "removed film over my eye"  . FRACTIONAL FLOW RESERVE WIRE N/A 06/26/2013   Procedure: FRACTIONAL FLOW RESERVE WIRE;  Surgeon: [name], MD;  Location: [place] CATH LAB;  Service: Cardiovascular;  Laterality: N/A;  . HERNIA REPAIR Left   . JOINT REPLACEMENT Left 03/2013   "thumb"  . LEFT HEART CATHETERIZATION WITH CORONARY ANGIOGRAM N/A 06/21/2013   Procedure: LEFT HEART CATHETERIZATION WITH CORONARY ANGIOGRAM;  Surgeon: Burnell Blanks, MD;  Location[facility] CATH LAB;  Service: Cardiovascular;  Laterality: N/A;  . LEFT HEART CATHETERIZATION WITH CORONARY ANGIOGRAM N/A 06/26/2013   Procedure: LEFT HEART CATHETERIZATION WITH CORONARY ANGIOGRAM;  Surgeon: [name], MD;  Location: [place] CATH LAB;  Service: Cardiovascular;  Laterality: N/A;  . TONSILLECTOMY    . TOTAL HIP ARTHROPLASTY Right 04/12/2016   Procedure: RIGHT TOTAL HIP ARTHROPLASTY ANTERIOR APPROACH;  Surgeon: Paralee Cancel, MD;  Location: [facility];  Service: Orthopedics;  Laterality: Right;  requests 70 mins  . TOTAL SHOULDER ARTHROPLASTY  03/01/2012   Procedure: TOTAL SHOULDER ARTHROPLASTY;  Surgeon: Marin Shutter, MD;  Location: [place];  Service: Orthopedics;  Laterality: Right;  . UMBILICAL HERNIA REPAIR        Medications: No current outpatient prescriptions on file.   No current facility-administered medications for this visit.     Allergies: No Active Allergies  Social History: The patient  reports that he has never smoked. He has never used smokeless tobacco. He reports that he does not drink alcohol or use drugs.   Family History: The patient's family history includes Coronary artery disease in his cousin; Diabetes type II in his mother; Heart attack in his father; Heart disease in his brother.   Review of Systems: Please see the history of present illness.   Otherwise, the review of systems is positive for none.   All other systems are reviewed and negative.   Physical Exam: VS:  BP (!) 150/80   Pulse (!) 59   Ht '5\' 9"'$  (1.753 m)   Wt 197 lb (89.4 kg)   BMI 29.09 kg/m  .  BMI Body mass index is 29.09 kg/m.     Wt Readings from Last 3 Encounters:  06/06/16 197 lb (89.4 kg)  04/12/16 198 lb (89.8 kg)  04/05/16 198 lb 1.6 oz (89.9 kg)    General: Pleasant. Well developed, well nourished and in no acute distress.   HEENT: Normal.  Neck: Supple, no JVD, carotid bruits, or masses noted.  Cardiac: Regular rate and rhythm. No murmurs, rubs, or gallops. No edema.  Respiratory:  Lungs are clear to auscultation bilaterally with normal work of breathing.  GI: Soft and nontender.  MS: No deformity or atrophy. Gait and ROM intact.  Skin: Warm and dry. Color is normal.  Neuro:  Strength and sensation are intact and no gross focal deficits noted.  Psych: Alert, appropriate and with normal affect.   LABORATORY DATA:  EKG:  EKG is ordered today. This demonstrates NSR with prior inferior  MI. Reviewed with Dr. Rayann Heman (DOD)  RecentLabs       Lab Results  Component Value Date   WBC 6.8 04/13/2016   HGB 10.7 (L) 04/13/2016   HCT 30.5 (L) 04/13/2016   PLT 127 (L) 04/13/2016   GLUCOSE 179 (H) 04/13/2016   CHOL 123 04/11/2011   TRIG 119.0 04/11/2011    HDL 41.60 04/11/2011   LDLCALC 58 04/11/2011   ALT 28 06/27/2013   AST 17 06/27/2013   NA 138 04/13/2016   K 4.4 04/13/2016   CL 108 04/13/2016   CREATININE 1.10 04/13/2016   BUN 21 (H) 04/13/2016   CO2 25 04/13/2016   INR 1.04 11/12/2015   HGBA1C 7.6 (H) 04/05/2016      BNP (last 3 results) RecentLabs(withinlast365days)  No results for input(s): BNP in the last 8760 hours.    ProBNP (last 3 results) RecentLabs(withinlast365days)  No results for input(s): PROBNP in the last 8760 hours.     Other Studies Reviewed Today:   Procedures   Left Heart Cath and Coronary Angiography 07/2015  Conclusion   1. The left ventricular systolic function is normal. 2. Prox RCA to Mid RCA lesion, 40% stenosed. 3. Mid RCA lesion, 40% stenosed. 4. Ost RPDA to RPDA lesion, 70% stenosed. 5. Dist RCA lesion, 70% stenosed. 6. RPDA lesion, 75% stenosed. 7. Ost LM lesion, 40% stenosed. 8. 1st Mrg lesion, 60% stenosed. 9. Mid Cx lesion, 70% stenosed. 10. Prox LAD to Dist LAD lesion, 10% stenosed. The lesion was previously treated with a stent (unknown type). 11. Dist LAD lesion, 40% stenosed. 12. 2nd Diag lesion, 80% stenosed. 13. 1st Diag lesion, 80% stenosed.  1. Continued patency of the stented segment in the LAD 2. Severe diffuse small vessel CAD with moderate-severe stenosis of the PDA/PLA bifurcation and severe stenosis of the diagonal branches 3. Moderate LCx stenosis 4. Normal LV function  Recommend:  Films reviewed. No appreciable change from 2015 studies.  Titrate anti-anginal Rx as tolerated  FFR of the LCx and RCA was performed in 2016 with negative findings  Exercise stress test to evaluate for ischemia on maximal medical therapy    GXT Study Highlights 07/2015  Patient presents today for routine GXT. Seen for Dr. Burt Mcbride. Has had recent cardiac cath - GXT to rule out ischemia. He has done well post cath. No further angina. On higher  dose of Norvasc. Reports better BP control.   Resting BP is 150/84 Target HR is 129  Today the patient exercised on the standard Bruce protocol for a total of 11 minutes.  Excellent exercise tolerance.  Mildly hypertensive blood pressure response (atenolol held today).   Max HR is 129 Max BP is 238/90  Clinically negative for chest pain. Test was stopped due to patient request and achievement of target HR.  EKG negative for ischemia. No significant arrhythmia noted. Rare PVC noted  Recommendations: Reviewed with Dr. Burt Mcbride. Will continue with current medical management which includes the higher dose of Norvasc See back in 3 months.   Burtis Junes, RN, ANP-C     Myoview stress test from 02/2015: -No T wave inversion was noted during stress. -There was no ST segment deviation noted during stress. -The study is normal. There is no ischemia. -This is a low risk study. -Nuclear stress EF: 62%.  Assessment/Plan:  1. CAD with history of chronic angina - last cath from June of 2017 noted - negative GXT for ischemia following that study - now presenting with several days of  recurring angina - responsive to sl NTG - has had multiple spells - even some while in the exam room today all worrisome for unstable angina - will admit to try and settle his symptoms down - plan to recath tomorrow - transferring by EMS to [facility]. Discussed patient with Dr. Rayann Heman (DOD) - EKG reviewed. He is in agreement with this plan of care. Will start IV Heparin and NTG. Plan for cardiac cath tomorrow at noon with Dr. Burt Mcbride - The patient understands that risks include but are not limited to stroke (1 in 1000), death (1 in 1000), kidney failure [usually temporary] (1 in 500), bleeding (1 in 200), allergic reaction [possibly serious] (1 in 200), and agrees to proceed.   2. HTN - BP up some here today - would follow - have room on his HCTZ and ARB if needed.   3. HLD - on statin - labs followed by  PCP.  4. DM - followed by Dr. Joylene Draft. Holding Glucophage.   5. Mildly dilated aortic root/ascending aorta - not seen on our last echo from May 2015 - would follow for now.    Current medicines are reviewed with the patient today.  The patient does not have concerns regarding medicines other than what has been noted above.  The following changes have been made:  See above.  Labs/ tests ordered today include:   No orders of the defined types were placed in this encounter.    Disposition:   Further disposition to follow. Transferred by EMS.    Patient is agreeable to this plan and will call if any problems develop in the interim.   SignedTruitt Merle, NP  06/06/2016 3:47 PM  [facility] [address] [place], [place]  [address] Phone: [phone] Fax: [phone]           Patient Instructions by Burtis Junes, NP at 06/06/2016 3:00 PM   Author: Burtis Junes, NP Author Type: Nurse Practitioner Filed: 06/06/2016 3:32 PM  Note Status: Signed Cosign: Cosign Not Required Encounter Date: 06/06/2016  Editor: Burtis Junes, NP (Nurse Practitioner)    We are admitting you to the hospital today.    Instructions   We are admitting you to the hospital today.  Communications      [facility] Provider CC Chart Rep sent to Hunter Infante, MD     Chart Routed to Thayer Headings, MD and Sherren Mocha, MD  Media   Electronic signature on 06/06/2016 2:30 PM   Routing History   Recipient Method Sent by Date Sent  Hunter Infante, MD Fax Burtis Junes, NP 06/06/2016  Fax: [phone] Phone: [phone]   Orders Placed    None  Medication Changes      Atenolol          Patient taking differently:  Take 50 mg by mouth at bedtime.      50 mg Oral Daily at bedtime       (Error)       (Error)       (Error)       (Error)    Medication List  Visit Diagnoses      Angina at rest [facility])    Problem List   Level of Service   Level of Service  PR OFFICE OUTPATIENT VISIT 25 MINUTES [70962]  All Charges for This Encounter   Code  99214  Description: PR OFFICE OUTPATIENT VISIT 25 MINUTES  Service Date: 06/06/2016  Service Provider: Burtis Junes, NP  Qty: 1

## 2016-06-07 ENCOUNTER — Ambulatory Visit: Payer: PPO | Admitting: Nurse Practitioner

## 2016-06-07 ENCOUNTER — Encounter (HOSPITAL_COMMUNITY)
Admission: AD | Disposition: A | Discharge status: Home / Self Care | Payer: Self-pay | Source: Ambulatory Visit | Attending: Cardiovascular Disease

## 2016-06-07 DIAGNOSIS — I25110 Atherosclerotic heart disease of native coronary artery with unstable angina pectoris: Secondary | ICD-10-CM | POA: Diagnosis not present

## 2016-06-07 DIAGNOSIS — I252 Old myocardial infarction: Secondary | ICD-10-CM | POA: Diagnosis not present

## 2016-06-07 DIAGNOSIS — R001 Bradycardia, unspecified: Secondary | ICD-10-CM

## 2016-06-07 DIAGNOSIS — Z96611 Presence of right artificial shoulder joint: Secondary | ICD-10-CM | POA: Diagnosis not present

## 2016-06-07 DIAGNOSIS — Z96641 Presence of right artificial hip joint: Secondary | ICD-10-CM | POA: Diagnosis not present

## 2016-06-07 DIAGNOSIS — K219 Gastro-esophageal reflux disease without esophagitis: Secondary | ICD-10-CM | POA: Diagnosis not present

## 2016-06-07 DIAGNOSIS — J45909 Unspecified asthma, uncomplicated: Secondary | ICD-10-CM | POA: Diagnosis not present

## 2016-06-07 DIAGNOSIS — Z955 Presence of coronary angioplasty implant and graft: Secondary | ICD-10-CM | POA: Diagnosis not present

## 2016-06-07 DIAGNOSIS — Z8249 Family history of ischemic heart disease and other diseases of the circulatory system: Secondary | ICD-10-CM | POA: Diagnosis not present

## 2016-06-07 DIAGNOSIS — G4733 Obstructive sleep apnea (adult) (pediatric): Secondary | ICD-10-CM | POA: Diagnosis not present

## 2016-06-07 DIAGNOSIS — E119 Type 2 diabetes mellitus without complications: Secondary | ICD-10-CM | POA: Diagnosis not present

## 2016-06-07 DIAGNOSIS — I10 Essential (primary) hypertension: Secondary | ICD-10-CM | POA: Diagnosis not present

## 2016-06-07 DIAGNOSIS — E785 Hyperlipidemia, unspecified: Secondary | ICD-10-CM | POA: Diagnosis not present

## 2016-06-07 DIAGNOSIS — I200 Unstable angina: Secondary | ICD-10-CM | POA: Diagnosis not present

## 2016-06-07 HISTORY — PX: CORONARY STENT INTERVENTION: CATH118234

## 2016-06-07 HISTORY — PX: LEFT HEART CATH AND CORONARY ANGIOGRAPHY: CATH118249

## 2016-06-07 LAB — CBC
HEMATOCRIT: 36.8 % — AB (ref 39.0–52.0)
HEMOGLOBIN: 12.4 g/dL — AB (ref 13.0–17.0)
MCH: 28.9 pg (ref 26.0–34.0)
MCHC: 33.7 g/dL (ref 30.0–36.0)
MCV: 85.8 fL (ref 78.0–100.0)
Platelets: 121 10*3/uL — ABNORMAL LOW (ref 150–400)
RBC: 4.29 MIL/uL (ref 4.22–5.81)
RDW: 12.6 % (ref 11.5–15.5)
WBC: 3.9 10*3/uL — AB (ref 4.0–10.5)

## 2016-06-07 LAB — GLUCOSE, CAPILLARY: Glucose-Capillary: 225 mg/dL — ABNORMAL HIGH (ref 65–99)

## 2016-06-07 LAB — LIPID PANEL
Cholesterol: 87 mg/dL (ref 0–200)
HDL: 35 mg/dL — ABNORMAL LOW (ref >40)
LDL Cholesterol: 37 mg/dL (ref 0–99)
Total CHOL/HDL Ratio: 2.5 RATIO
Triglycerides: 74 mg/dL (ref <150)
VLDL: 15 mg/dL (ref 0–40)

## 2016-06-07 LAB — TROPONIN I: Troponin I: <0.03 ng/mL (ref <0.03)

## 2016-06-07 LAB — POCT ACTIVATED CLOTTING TIME: Activated Clotting Time: 351 seconds

## 2016-06-07 LAB — HEPARIN LEVEL (UNFRACTIONATED): HEPARIN UNFRACTIONATED: 0.44 [IU]/mL (ref 0.30–0.70)

## 2016-06-07 SURGERY — LEFT HEART CATH AND CORONARY ANGIOGRAPHY
Anesthesia: LOCAL

## 2016-06-07 MED ORDER — HEPARIN (PORCINE) IN NACL 2-0.9 UNIT/ML-% IJ SOLN
INTRAMUSCULAR | Status: DC | PRN
  Administered 2016-06-07: 1000 mL

## 2016-06-07 MED ORDER — SODIUM CHLORIDE 0.9 % IV SOLN: 250.0000 mL | INTRAVENOUS | Status: DC | PRN

## 2016-06-07 MED ORDER — FENTANYL CITRATE (PF) 100 MCG/2ML IJ SOLN
INTRAMUSCULAR | Status: DC | PRN
  Administered 2016-06-07: 25 ug via INTRAVENOUS
  Administered 2016-06-07: 50 ug via INTRAVENOUS

## 2016-06-07 MED ORDER — LIDOCAINE HCL 1 % IJ SOLN
INTRAMUSCULAR | Status: AC
  Filled 2016-06-07: qty 20

## 2016-06-07 MED ORDER — VERAPAMIL HCL 2.5 MG/ML IV SOLN
INTRAVENOUS | Status: DC | PRN
  Administered 2016-06-07: 10 mL via INTRA_ARTERIAL

## 2016-06-07 MED ORDER — LIDOCAINE HCL (PF) 1 % IJ SOLN
INTRAMUSCULAR | Status: DC | PRN
  Administered 2016-06-07: 2 mL

## 2016-06-07 MED ORDER — INSULIN ASPART 100 UNIT/ML ~~LOC~~ SOLN: 0.0000 [IU] | Freq: Three times a day (TID) | SUBCUTANEOUS | Status: DC

## 2016-06-07 MED ORDER — SODIUM CHLORIDE 0.9 % IV SOLN: INTRAVENOUS | Status: AC

## 2016-06-07 MED ORDER — HEPARIN SODIUM (PORCINE) 1000 UNIT/ML IJ SOLN
INTRAMUSCULAR | Status: DC | PRN
  Administered 2016-06-07: 4500 [IU] via INTRAVENOUS
  Administered 2016-06-07: 5500 [IU] via INTRAVENOUS
  Administered 2016-06-07: 2000 [IU] via INTRAVENOUS

## 2016-06-07 MED ORDER — IOPAMIDOL (ISOVUE-370) INJECTION 76%
INTRAVENOUS | Status: DC | PRN
  Administered 2016-06-07: 190 mL via INTRA_ARTERIAL

## 2016-06-07 MED ORDER — HEPARIN SODIUM (PORCINE) 1000 UNIT/ML IJ SOLN
INTRAMUSCULAR | Status: AC
  Filled 2016-06-07: qty 1

## 2016-06-07 MED ORDER — VERAPAMIL HCL 2.5 MG/ML IV SOLN
INTRAVENOUS | Status: AC
  Filled 2016-06-07: qty 2

## 2016-06-07 MED ORDER — MIDAZOLAM HCL 2 MG/2ML IJ SOLN
INTRAMUSCULAR | Status: DC | PRN
  Administered 2016-06-07: 1 mg via INTRAVENOUS
  Administered 2016-06-07: 2 mg via INTRAVENOUS

## 2016-06-07 MED ORDER — SODIUM CHLORIDE 0.9% FLUSH: 3.0000 mL | INTRAVENOUS | Status: DC | PRN

## 2016-06-07 MED ORDER — INSULIN ASPART 100 UNIT/ML ~~LOC~~ SOLN
0.0000 [IU] | Freq: Every day | SUBCUTANEOUS | Status: DC
Start: 2016-06-07 — End: 2016-06-08
  Administered 2016-06-07: 21:00:00 2 [IU] via SUBCUTANEOUS

## 2016-06-07 MED ORDER — HEPARIN (PORCINE) IN NACL 2-0.9 UNIT/ML-% IJ SOLN
INTRAMUSCULAR | Status: AC
  Filled 2016-06-07: qty 1000

## 2016-06-07 MED ORDER — LABETALOL HCL 5 MG/ML IV SOLN: 10.0000 mg | INTRAVENOUS | Status: AC | PRN

## 2016-06-07 MED ORDER — IOPAMIDOL (ISOVUE-370) INJECTION 76%
INTRAVENOUS | Status: AC
  Filled 2016-06-07: qty 100

## 2016-06-07 MED ORDER — FENTANYL CITRATE (PF) 100 MCG/2ML IJ SOLN
INTRAMUSCULAR | Status: AC
  Filled 2016-06-07: qty 2

## 2016-06-07 MED ORDER — ANGIOPLASTY BOOK
Freq: Once | Status: AC
  Administered 2016-06-07: 21:00:00
  Filled 2016-06-07: qty 1

## 2016-06-07 MED ORDER — HYDRALAZINE HCL 20 MG/ML IJ SOLN: 5.0000 mg | INTRAMUSCULAR | Status: AC | PRN

## 2016-06-07 MED ORDER — SODIUM CHLORIDE 0.9% FLUSH
3.0000 mL | Freq: Two times a day (BID) | INTRAVENOUS | Status: DC
  Administered 2016-06-07 – 2016-06-08 (×2): 3 mL via INTRAVENOUS

## 2016-06-07 MED ORDER — MIDAZOLAM HCL 2 MG/2ML IJ SOLN
INTRAMUSCULAR | Status: AC
  Filled 2016-06-07: qty 2

## 2016-06-07 SURGICAL SUPPLY — 20 items
BALLN EMERGE MR 2.0X12 (BALLOONS) ×2
BALLN ~~LOC~~ EMERGE MR 2.25X15 (BALLOONS) ×2
BALLN ~~LOC~~ MOZEC 3.0X10 (BALLOONS) ×2
BALLOON EMERGE MR 2.0X12 (BALLOONS) IMPLANT
BALLOON ~~LOC~~ EMERGE MR 2.25X15 (BALLOONS) IMPLANT
BALLOON ~~LOC~~ MOZEC 3.0X10 (BALLOONS) IMPLANT
CATH 5FR JL3.5 JR4 ANG PIG MP (CATHETERS) ×1 IMPLANT
CATH LAUNCHER 6FR AL.75 (CATHETERS) ×1 IMPLANT
DEVICE RAD COMP TR BAND LRG (VASCULAR PRODUCTS) ×1 IMPLANT
GLIDESHEATH SLEND SS 6F .021 (SHEATH) ×1 IMPLANT
GUIDEWIRE INQWIRE 1.5J.035X260 (WIRE) IMPLANT
INQWIRE 1.5J .035X260CM (WIRE) ×2
KIT ENCORE 26 ADVANTAGE (KITS) ×1 IMPLANT
KIT HEART LEFT (KITS) ×2 IMPLANT
PACK CARDIAC CATHETERIZATION (CUSTOM PROCEDURE TRAY) ×2 IMPLANT
STENT SYNERGY DES 2.75X12 (Permanent Stent) ×1 IMPLANT
SYR MEDRAD MARK V 150ML (SYRINGE) ×2 IMPLANT
TRANSDUCER W/STOPCOCK (MISCELLANEOUS) ×2 IMPLANT
TUBING CIL FLEX 10 FLL-RA (TUBING) ×2 IMPLANT
WIRE COUGAR XT STRL 190CM (WIRE) ×2 IMPLANT

## 2016-06-07 NOTE — Interval H&P Note (Signed)
History and Physical Interval Note:  06/07/2016 2:21 PM  Hunter Mcbride.  has presented today for cardiac cath with the diagnosis of unstable angina  The various methods of treatment have been discussed with the patient and family. After consideration of risks, benefits and other options for treatment, the patient has consented to  Procedure(s): Left Heart Cath and Coronary Angiography (N/A) as a surgical intervention .  The patient's history has been reviewed, patient examined, no change in status, stable for surgery.  I have reviewed the patient's chart and labs.  Questions were answered to the patient's satisfaction.    Cath Lab Visit (complete for each Cath Lab visit)  Clinical Evaluation Leading to the Procedure:   ACS: No.  Non-ACS:    Anginal Classification: CCS III  Anti-ischemic medical therapy: Maximal Therapy (2 or more classes of medications)  Non-Invasive Test Results: No non-invasive testing performed  Prior CABG: No previous CABG         Lauree Chandler

## 2016-06-07 NOTE — Progress Notes (Signed)
TR BAND REMOVAL  LOCATION:    right radial  DEFLATED PER PROTOCOL:    Yes.    TIME BAND OFF / DRESSING APPLIED:    2215   SITE UPON ARRIVAL:    Level 0  SITE AFTER BAND REMOVAL:    Level 0  CIRCULATION SENSATION AND MOVEMENT:    Within Normal Limits   Yes.    COMMENTS:   Radial site teaching reinforced.

## 2016-06-07 NOTE — Progress Notes (Signed)
ANTICOAGULATION CONSULT NOTE - Follow Up Consult  Pharmacy Consult for Heparin  Indication: chest pain/ACS  No Active Allergies  Patient Measurements: Height: '5\' 9"'$  (175.3 cm) Weight: 193 lb 4.8 oz (87.7 kg) IBW/kg (Calculated) : 70.7 Vital Signs: Temp: 98.4 F (36.9 C) (04/24 0749) Temp Source: Oral (04/24 0334) BP: 144/79 (04/24 0700) Pulse Rate: 52 (04/24 0700)  Labs:  Recent Labs  06/06/16 1817 06/06/16 2147 06/06/16 2315 06/07/16 0425  HGB 13.3  --   --  12.4*  HCT 40.5  --   --  36.8*  PLT 143*  --   --  121*  APTT 33  --   --   --   LABPROT 13.0  --   --   --   INR 0.98  --   --   --   HEPARINUNFRC  --   --  0.40 0.44  CREATININE 0.82  --   --   --   TROPONINI <0.03 <0.03  --  <0.03    Estimated Creatinine Clearance: 93.2 mL/min (by C-G formula based on SCr of 0.82 mg/dL).  . sodium chloride    . sodium chloride 50 mL/hr at 06/07/16 0700  . heparin 1,200 Units/hr (06/07/16 0700)  . nitroGLYCERIN Stopped (06/07/16 0400)    Assessment: 71 yo male admitted with chest pain, on IV heparin.  Heparin level remains at goal this morning.  Planning for cath lab this afternoon.  CBC stable, no bleeding or complications noted.  Goal of Therapy:  Heparin level 0.3-0.7 units/ml Monitor platelets by anticoagulation protocol: Yes   Plan:  -Continue IV Heparin at current rate. -Daily heparin level and CBC. -F/u plans for heparin after cath lab today.  Uvaldo Rising, BCPS  Clinical Pharmacist Pager [phone]  06/07/2016 7:59 AM

## 2016-06-07 NOTE — Progress Notes (Signed)
Progress Note  Patient Name: Hunter Mcbride. Date of Encounter: 06/07/2016  Primary Cardiologist:  Dr. Acie Fredrickson  Subjective   He did have some chest pain last night and NTG was increased.  Currently pain free  Inpatient Medications    Scheduled Meds: . aspirin EC  81 mg Oral Daily  . atenolol  50 mg Oral Daily  . atorvastatin  80 mg Oral q1800  . canagliflozin  300 mg Oral QAC breakfast  . clopidogrel  75 mg Oral Q breakfast  . gabapentin  300 mg Oral BID  . hydrochlorothiazide  12.5 mg Oral Once per day on Mon Wed Fri  . irbesartan  150 mg Oral Daily  . isosorbide mononitrate  30 mg Oral Daily  . pantoprazole  40 mg Oral BID  . ranolazine  500 mg Oral BID  . sodium chloride flush  3 mL Intravenous Q12H   Continuous Infusions: . sodium chloride    . sodium chloride 50 mL/hr at 06/07/16 0800  . heparin 1,200 Units/hr (06/07/16 0800)  . nitroGLYCERIN Stopped (06/07/16 0400)   PRN Meds: sodium chloride, acetaminophen, ALPRAZolam, nitroGLYCERIN, ondansetron (ZOFRAN) IV, sodium chloride flush   Vital Signs    Vitals:   06/07/16 0600 06/07/16 0700 06/07/16 0749 06/07/16 0800  BP: (!) 151/83 (!) 144/79  (!) 152/79  Pulse: (!) 54 (!) 52  (!) 49  Resp: '13 17  13  '$ Temp:   98.4 F (36.9 C)   TempSrc:      SpO2: 100% 98%  98%  Weight:      Height:        Intake/Output Summary (Last 24 hours) at 06/07/16 0825 Last data filed at 06/07/16 0800  Gross per 24 hour  Intake          1230.75 ml  Output             2376 ml  Net         -1145.25 ml   Filed Weights   06/06/16 1629  Weight: 193 lb 4.8 oz (87.7 kg)    Telemetry    SB with rare ectopy - Personally Reviewed  ECG    NA - Personally Reviewed  Physical Exam   GEN: No acute distress.  No distress Neck: No  JVD Cardiac: RRR, no murmurs, rubs, or gallops.  Respiratory: Clear  to auscultation bilaterally. GI: Soft, nontender, non-distended  MS: No  edema; No deformity. Neuro:  Nonfocal  Psych: Normal  affect   Labs    Chemistry Recent Labs Lab 06/06/16 1817  NA 139  K 4.1  CL 106  CO2 22  GLUCOSE 132*  BUN 14  CREATININE 0.82  CALCIUM 9.4  PROT 6.5  ALBUMIN 4.2  AST 30  ALT 15*  ALKPHOS 82  BILITOT 0.8  GFRNONAA >60  GFRAA >60  ANIONGAP 11     Hematology Recent Labs Lab 06/06/16 1817 06/07/16 0425  WBC 3.8* 3.9*  RBC 4.70 4.29  HGB 13.3 12.4*  HCT 40.5 36.8*  MCV 86.2 85.8  MCH 28.3 28.9  MCHC 32.8 33.7  RDW 12.7 12.6  PLT 143* 121*    Cardiac Enzymes Recent Labs Lab 06/06/16 1817 06/06/16 2147 06/07/16 0425  TROPONINI <0.03 <0.03 <0.03   No results for input(s): TROPIPOC in the last 168 hours.   BNP Recent Labs Lab 06/06/16 1818  BNP 54.3    Lab Results  Component Value Date   CHOL 87 06/07/2016   TRIG 74 06/07/2016  HDL 35 (L) 06/07/2016   LDLCALC 37 06/07/2016    DDimer No results for input(s): DDIMER in the last 168 hours.   Radiology    X-ray Chest Pa And Lateral  Result Date: 06/07/2016 CLINICAL DATA:  Acute onset of right-sided chest pain and nausea. Initial encounter. EXAM: CHEST  2 VIEW COMPARISON:  Chest radiograph performed 11/12/2015 FINDINGS: The lungs are well-aerated and clear. There is no evidence of focal opacification, pleural effusion or pneumothorax. The heart is normal in size; the mediastinal contour is within normal limits. No acute osseous abnormalities are seen. A right shoulder arthroplasty is incompletely imaged, but appears grossly unremarkable. Anterior bridging osteophytes are noted along the lower thoracic spine. IMPRESSION: No acute cardiopulmonary process seen. Electronically Signed   By: Garald Balding M.D.   On: 06/07/2016 00:46    Cardiac Studies   NA  Patient Profile     71 y.o. male a [place] history of CAD s/p PTCA/DESx 3 to mid LAD May 2015 with multiple repeat caths managed medically.  He presented to the office with [place] and is readmitted.  Assessment & Plan    UNSTABLE ANGINA:   Enzymes  negative x 3.   Cath later today.   HTN:   HR is low today so not getting beta blocker this morning.   DM:  Continue current meds with out patient therapy per Jerlyn Ly, MD.  Metformin on hold.  Consider empagliflozin as an out patient. Lab Results  Component Value Date   HGBA1C 7.6 (H) 04/05/2016   DYSLIPIDEMIA:    Excellent lipid profile. Continue current therapy.   Signed, Minus Breeding, MD  06/07/2016, 8:25 AM

## 2016-06-07 NOTE — Care Management Obs Status (Signed)
[facility] [facility] NOTIFICATION   Patient Details  Name: Hunter Mcbride. MRN: [id number] Date of Birth: 1946-02-11   Medicare Observation Status Notification Given:  Yes    Carles Collet, RN 06/07/2016, 1:45 PM

## 2016-06-07 NOTE — Care Management Note (Signed)
Case Management Note  Patient Details  Name: Hunter Mcbride. MRN: [id number] Date of Birth: 1946/02/10  Subjective/Objective:      s/p  Coronary Stent Intervention, will be on plavix.             Action/Plan: NCM will follow for dc needs.  Expected Discharge Date:                  Expected Discharge Plan:     In-House Referral:     Discharge planning Services  CM Consult  Post Acute Care Choice:    Choice offered to:     DME Arranged:    DME Agency:     HH Arranged:    HH Agency:     Status of Service:  In process, will continue to follow  If discussed at Portal Length of Stay Meetings, dates discussed:    Additional Comments:  Zenon Mayo, RN 06/07/2016, 4:51 PM

## 2016-06-07 NOTE — H&P (View-Only) (Signed)
Progress Note  Patient Name: Hunter Mcbride. Date of Encounter: 06/07/2016  Primary Cardiologist:  Dr. Acie Fredrickson  Subjective   He did have some chest pain last night and NTG was increased.  Currently pain free  Inpatient Medications    Scheduled Meds: . aspirin EC  81 mg Oral Daily  . atenolol  50 mg Oral Daily  . atorvastatin  80 mg Oral q1800  . canagliflozin  300 mg Oral QAC breakfast  . clopidogrel  75 mg Oral Q breakfast  . gabapentin  300 mg Oral BID  . hydrochlorothiazide  12.5 mg Oral Once per day on Mon Wed Fri  . irbesartan  150 mg Oral Daily  . isosorbide mononitrate  30 mg Oral Daily  . pantoprazole  40 mg Oral BID  . ranolazine  500 mg Oral BID  . sodium chloride flush  3 mL Intravenous Q12H   Continuous Infusions: . sodium chloride    . sodium chloride 50 mL/hr at 06/07/16 0800  . heparin 1,200 Units/hr (06/07/16 0800)  . nitroGLYCERIN Stopped (06/07/16 0400)   PRN Meds: sodium chloride, acetaminophen, ALPRAZolam, nitroGLYCERIN, ondansetron (ZOFRAN) IV, sodium chloride flush   Vital Signs    Vitals:   06/07/16 0600 06/07/16 0700 06/07/16 0749 06/07/16 0800  BP: (!) 151/83 (!) 144/79  (!) 152/79  Pulse: (!) 54 (!) 52  (!) 49  Resp: '13 17  13  '$ Temp:   98.4 F (36.9 C)   TempSrc:      SpO2: 100% 98%  98%  Weight:      Height:        Intake/Output Summary (Last 24 hours) at 06/07/16 0825 Last data filed at 06/07/16 0800  Gross per 24 hour  Intake          1230.75 ml  Output             2376 ml  Net         -1145.25 ml   Filed Weights   06/06/16 1629  Weight: 193 lb 4.8 oz (87.7 kg)    Telemetry    SB with rare ectopy - Personally Reviewed  ECG    NA - Personally Reviewed  Physical Exam   GEN: No acute distress.  No distress Neck: No  JVD Cardiac: RRR, no murmurs, rubs, or gallops.  Respiratory: Clear  to auscultation bilaterally. GI: Soft, nontender, non-distended  MS: No  edema; No deformity. Neuro:  Nonfocal  Psych: Normal  affect   Labs    Chemistry Recent Labs Lab 06/06/16 1817  NA 139  K 4.1  CL 106  CO2 22  GLUCOSE 132*  BUN 14  CREATININE 0.82  CALCIUM 9.4  PROT 6.5  ALBUMIN 4.2  AST 30  ALT 15*  ALKPHOS 82  BILITOT 0.8  GFRNONAA >60  GFRAA >60  ANIONGAP 11     Hematology Recent Labs Lab 06/06/16 1817 06/07/16 0425  WBC 3.8* 3.9*  RBC 4.70 4.29  HGB 13.3 12.4*  HCT 40.5 36.8*  MCV 86.2 85.8  MCH 28.3 28.9  MCHC 32.8 33.7  RDW 12.7 12.6  PLT 143* 121*    Cardiac Enzymes Recent Labs Lab 06/06/16 1817 06/06/16 2147 06/07/16 0425  TROPONINI <0.03 <0.03 <0.03   No results for input(s): TROPIPOC in the last 168 hours.   BNP Recent Labs Lab 06/06/16 1818  BNP 54.3    Lab Results  Component Value Date   CHOL 87 06/07/2016   TRIG 74 06/07/2016  HDL 35 (L) 06/07/2016   LDLCALC 37 06/07/2016    DDimer No results for input(s): DDIMER in the last 168 hours.   Radiology    X-ray Chest Pa And Lateral  Result Date: 06/07/2016 CLINICAL DATA:  Acute onset of right-sided chest pain and nausea. Initial encounter. EXAM: CHEST  2 VIEW COMPARISON:  Chest radiograph performed 11/12/2015 FINDINGS: The lungs are well-aerated and clear. There is no evidence of focal opacification, pleural effusion or pneumothorax. The heart is normal in size; the mediastinal contour is within normal limits. No acute osseous abnormalities are seen. A right shoulder arthroplasty is incompletely imaged, but appears grossly unremarkable. Anterior bridging osteophytes are noted along the lower thoracic spine. IMPRESSION: No acute cardiopulmonary process seen. Electronically Signed   By: Garald Balding M.D.   On: 06/07/2016 00:46    Cardiac Studies   NA  Patient Profile     71 y.o. male a [place] history of CAD s/p PTCA/DESx 3 to mid LAD May 2015 with multiple repeat caths managed medically.  He presented to the office with [place] and is readmitted.  Assessment & Plan    UNSTABLE ANGINA:   Enzymes  negative x 3.   Cath later today.   HTN:   HR is low today so not getting beta blocker this morning.   DM:  Continue current meds with out patient therapy per Jerlyn Ly, MD.  Metformin on hold.  Consider empagliflozin as an out patient. Lab Results  Component Value Date   HGBA1C 7.6 (H) 04/05/2016   DYSLIPIDEMIA:    Excellent lipid profile. Continue current therapy.   Signed, Minus Breeding, MD  06/07/2016, 8:25 AM

## 2016-06-08 ENCOUNTER — Encounter (HOSPITAL_COMMUNITY): Payer: Self-pay | Admitting: Cardiovascular Disease

## 2016-06-08 ENCOUNTER — Ambulatory Visit: Payer: PPO | Admitting: Nurse Practitioner

## 2016-06-08 DIAGNOSIS — I200 Unstable angina: Secondary | ICD-10-CM

## 2016-06-08 LAB — BASIC METABOLIC PANEL
Anion gap: 6 (ref 5–15)
BUN: 11 mg/dL (ref 6–20)
CHLORIDE: 106 mmol/L (ref 101–111)
CO2: 28 mmol/L (ref 22–32)
CREATININE: 0.8 mg/dL (ref 0.61–1.24)
Calcium: 8.9 mg/dL (ref 8.9–10.3)
GFR calc Af Amer: >60 mL/min (ref >60)
GFR calc non Af Amer: >60 mL/min (ref >60)
Glucose, Bld: 93 mg/dL (ref 65–99)
Potassium: 3.9 mmol/L (ref 3.5–5.1)
Sodium: 140 mmol/L (ref 135–145)

## 2016-06-08 LAB — CBC
HCT: 37.1 % — ABNORMAL LOW (ref 39.0–52.0)
HEMOGLOBIN: 12.5 g/dL — AB (ref 13.0–17.0)
MCH: 28.8 pg (ref 26.0–34.0)
MCHC: 33.7 g/dL (ref 30.0–36.0)
MCV: 85.5 fL (ref 78.0–100.0)
Platelets: 112 10*3/uL — ABNORMAL LOW (ref 150–400)
RBC: 4.34 MIL/uL (ref 4.22–5.81)
RDW: 12.7 % (ref 11.5–15.5)
WBC: 3.6 10*3/uL — AB (ref 4.0–10.5)

## 2016-06-08 LAB — HEMOGLOBIN A1C
Hgb A1c MFr Bld: 7.1 % — ABNORMAL HIGH (ref 4.8–5.6)
Mean Plasma Glucose: 157 mg/dL

## 2016-06-08 LAB — GLUCOSE, CAPILLARY: GLUCOSE-CAPILLARY: 88 mg/dL (ref 65–99)

## 2016-06-08 MED ORDER — ACETAMINOPHEN 500 MG PO TABS: 1000.0000 mg | ORAL_TABLET | Freq: Three times a day (TID) | ORAL | 0 refills | Status: DC | PRN

## 2016-06-08 MED ORDER — METFORMIN HCL ER 500 MG PO TB24
1000.0000 mg | ORAL_TABLET | Freq: Two times a day (BID) | ORAL | Status: DC
Start: 2016-06-08 — End: 2016-11-12

## 2016-06-08 NOTE — Progress Notes (Signed)
Progress Note  Patient Name: Hunter Mcbride. Date of Encounter: 06/08/2016  Primary Cardiologist:  Dr. Acie Fredrickson  Subjective   Patient is feeling well this morning. No further chest discomfort since stent placement.   Inpatient Medications    Scheduled Meds: . aspirin EC  81 mg Oral Daily  . atenolol  50 mg Oral Daily  . atorvastatin  80 mg Oral q1800  . canagliflozin  300 mg Oral QAC breakfast  . clopidogrel  75 mg Oral Q breakfast  . gabapentin  300 mg Oral BID  . hydrochlorothiazide  12.5 mg Oral Once per day on Mon Wed Fri  . insulin aspart  0-5 Units Subcutaneous QHS  . insulin aspart  0-9 Units Subcutaneous TID WC  . irbesartan  150 mg Oral Daily  . isosorbide mononitrate  30 mg Oral Daily  . pantoprazole  40 mg Oral BID  . ranolazine  500 mg Oral BID  . sodium chloride flush  3 mL Intravenous Q12H  . sodium chloride flush  3 mL Intravenous Q12H   Continuous Infusions: . sodium chloride    . sodium chloride     PRN Meds: sodium chloride, sodium chloride, acetaminophen, ALPRAZolam, nitroGLYCERIN, ondansetron (ZOFRAN) IV, sodium chloride flush, sodium chloride flush   Vital Signs    Vitals:   06/08/16 0348 06/08/16 0802 06/08/16 0900 06/08/16 1006  BP: (!) 141/86 (!) 150/73 (!) 175/79 137/81  Pulse: (!) 54 61 64   Resp: 16 20    Temp: 97.7 F (36.5 C) 97.6 F (36.4 C)    TempSrc: Oral Oral    SpO2: 96% 95%    Weight: 189 lb 9.5 oz (86 kg)     Height:        Intake/Output Summary (Last 24 hours) at 06/08/16 1009 Last data filed at 06/08/16 1007  Gross per 24 hour  Intake             1596 ml  Output             2475 ml  Net             -879 ml   Filed Weights   06/06/16 1629 06/08/16 0348  Weight: 193 lb 4.8 oz (87.7 kg) 189 lb 9.5 oz (86 kg)    Telemetry    SB with heart rates as low as 48 when asleep, mostly in the 50's-60's while awake - Personally Reviewed  ECG    NSR 1st degree AVB, 52 bpm - Personally Reviewed  Physical Exam   GEN:  No acute distress.  No distress Neck: No  JVD Cardiac: RRR, no murmurs, rubs, or gallops.  Respiratory: Clear  to auscultation bilaterally. GI: Soft, nontender, non-distended  MS: No  edema; No deformity. Neuro:  Nonfocal  Psych: Normal affect   Labs    Chemistry  Recent Labs Lab 06/06/16 1817 06/08/16 0341  NA 139 140  K 4.1 3.9  CL 106 106  CO2 22 28  GLUCOSE 132* 93  BUN 14 11  CREATININE 0.82 0.80  CALCIUM 9.4 8.9  PROT 6.5  --   ALBUMIN 4.2  --   AST 30  --   ALT 15*  --   ALKPHOS 82  --   BILITOT 0.8  --   GFRNONAA >60 >60  GFRAA >60 >60  ANIONGAP 11 6     Hematology  Recent Labs Lab 06/06/16 1817 06/07/16 0425 06/08/16 0341  WBC 3.8* 3.9* 3.6*  RBC 4.70 4.29  4.34  HGB 13.3 12.4* 12.5*  HCT 40.5 36.8* 37.1*  MCV 86.2 85.8 85.5  MCH 28.3 28.9 28.8  MCHC 32.8 33.7 33.7  RDW 12.7 12.6 12.7  PLT 143* 121* 112*    Cardiac Enzymes  Recent Labs Lab 06/06/16 1817 06/06/16 2147 06/07/16 0425  TROPONINI <0.03 <0.03 <0.03   No results for input(s): TROPIPOC in the last 168 hours.   BNP  Recent Labs Lab 06/06/16 1818  BNP 54.3    Lab Results  Component Value Date   CHOL 87 06/07/2016   TRIG 74 06/07/2016   HDL 35 (L) 06/07/2016   LDLCALC 37 06/07/2016    DDimer No results for input(s): DDIMER in the last 168 hours.   Radiology    X-ray Chest Pa And Lateral  Result Date: 06/07/2016 CLINICAL DATA:  Acute onset of right-sided chest pain and nausea. Initial encounter. EXAM: CHEST  2 VIEW COMPARISON:  Chest radiograph performed 11/12/2015 FINDINGS: The lungs are well-aerated and clear. There is no evidence of focal opacification, pleural effusion or pneumothorax. The heart is normal in size; the mediastinal contour is within normal limits. No acute osseous abnormalities are seen. A right shoulder arthroplasty is incompletely imaged, but appears grossly unremarkable. Anterior bridging osteophytes are noted along the lower thoracic spine.  IMPRESSION: No acute cardiopulmonary process seen. Electronically Signed   By: Garald Balding M.D.   On: 06/07/2016 00:46    Cardiac Studies   Coronary Stent Intervention  Left Heart Cath and Coronary Angiography  Conclusion     RPDA lesion, 75 %stenosed.  Ost LM lesion, 40 %stenosed.  1st Mrg lesion, 60 %stenosed.  Prox LAD to Dist LAD lesion, 10 %stenosed.  Dist LAD lesion, 40 %stenosed.  2nd Diag lesion, 80 %stenosed.  1st Diag lesion, 80 %stenosed.  Dist RCA lesion, 90 %stenosed.  Ost RPDA to RPDA lesion, 95 %stenosed.  Mid RCA lesion, 40 %stenosed.  Prox RCA to Mid RCA lesion, 40 %stenosed.  Mid Cx lesion, 60 %stenosed.  The left ventricular systolic function is normal.  LV end diastolic pressure is normal.  The left ventricular ejection fraction is 55-65% by visual estimate.  There is no mitral valve regurgitation.   1. Triple vessel CAD 2. Severe stenosis ostium of the moderate caliber posterolateral branch. Successful PTCA/DES x 1 proximal posterolateral artery.  3. Severe stenosis small to moderate caliber PDA. Successful PTCA with balloon angioplasty only proximal PDA.  4. Patent stent mid LAD.  5. Diffuse disease in the small caliber diagonal branches.  6. Normal LV systolic function 7. Mild left main stenosis.   Recommendations: Continue DAPT for at least one year. Continue statin, beta blocker, Norvasc and Ranexa.    Diagnostic Diagram       Patient Profile     71 y.o. male a [place] history of CAD s/p PTCA/DESx 3 to mid LAD May 2015 with multiple repeat caths managed medically.  He presented to the office with [place] and was readmitted.  Assessment & Plan    UNSTABLE ANGINA:   Enzymes negative x 3.   Cardiac catheterization on 06/07/16 showed triple vessel disease with severe stenosis of the ostial RPDA which was successfully stented with DES and severe stenosis of the proximal PDA, angioplastiesd. The previous stent to the LAD is patent. See  full cath report. Recommendations include continuation of DAPT for at least 1 year and continuation of statin, beta blocker, norvasc and Ranexa. Pt is doing well, no further chest pain. Plan for discharge home  today.   HTN:   HR is low and on atenolol, but pt has been tolerating atenolol at home with low HR for a [place] time.   DM:  Continue current meds with out patient therapy per Crist Infante A, MD.  Metformin on hold- resume 48 hrs post cath.  Consider empagliflozin as an out patient.  Lab Results  Component Value Date   HGBA1C 7.1 (H) 06/06/2016   DYSLIPIDEMIA:    Excellent lipid profile. LDL 37 at goal <70. Continue current therapy.   Signed, Daune Perch, NP  06/08/2016, 10:09 AM    History and all data above reviewed.  Patient examined.  I agree with the findings as above. No chest pain.  No SOB.   The patient exam reveals COR:RRR  ,  Lungs: Clear,  Abd: Positive bowel sounds, no rebound no guarding, Ext No edema, right wrist without bruising or bleeding.    All available labs, radiology testing, previous records reviewed. Agree with documented assessment and plan. OK to discharge.  Meds as listed above.  Minus Breeding  10:51 AM  06/08/2016

## 2016-06-08 NOTE — Care Management Note (Signed)
Case Management Note  Patient Details  Name: Hunter Mcbride. MRN: [id number] Date of Birth: 12-28-1945  Subjective/Objective:      s/p  Coronary Stent Intervention, will be on plavix, for dc today, no needs.               Action/Plan:   Expected Discharge Date:  06/08/16               Expected Discharge Plan:  Home/Self Care  In-House Referral:     Discharge planning Services  CM Consult  Post Acute Care Choice:    Choice offered to:     DME Arranged:    DME Agency:     HH Arranged:    HH Agency:     Status of Service:  Completed, signed off  If discussed at [employer] of Stay Meetings, dates discussed:    Additional Comments:  Zenon Mayo, RN 06/08/2016, 11:16 AM

## 2016-06-08 NOTE — Discharge Summary (Signed)
Discharge Summary    Patient ID: Hunter Mcbride.,  MRN: [id number], DOB/AGE: March 15, 1945 71 y.o.  Admit date: 06/06/2016 Discharge date: 06/08/2016  Primary Care Provider: [facility] Primary Cardiologist: Dr Acie Fredrickson  Discharge Diagnoses    Active Problems:   Unstable angina ([place])   Allergies No Known Allergies   History of Present Illness     Hunter Mcbrideis a 71 y.o.malewith a Crear history of CAD s/p PTCA/DESx 3 to mid LAD May 2015 with multiple repeat caths managed medically and HTN, HLD, DM and OSA. He had developed discomfort in the back moving to the anterior right chest that was different than his chronic angina and was relieved by NTG. His discomfort began to come back quicker and quicker after the NTG so he presented to the office on 4/23 and was sent to the hospital for symptoms worrisome for unstable angina.   Hospital Course     Consultants: None  Heparin and NTG were initiated and the patient continued to have intermittent chest discomfort. Cardiac enzymes were negative x 3. Cardiac catheterization on 06/07/16 showed triple vessel disease with severe stenosis of the ostial RPDA which was successfully stented with DES and severe stenosis of the proximal PDA, angioplastied. The previous stent to the LAD is patent. See full cath report. Recommendations include continuation of DAPT for at least 1 year and continuation of statin, beta blocker, norvasc and Ranexa. Pt is doing well, no further chest pain.  HR is low and on atenolol, but pt has been tolerating atenolol at home with low HR for a Mcwright time. His Hemoglobin A1c is 7.1. Continue current meds with out patient therapy per Crist Infante A, MD. Metformin on hold- resume 48 hrs post cath.  Consider empagliflozin as an out patient. He has an excellent lipid profile with LDL 37 at goal <70. Continue current therapy.  EKG and labs are stable today.   Patient has been seen by Dr. Percival Spanish today and deemed ready for  discharge home. All follow up appointments have been scheduled. Discharge medications are listed below.  _____________  Discharge Vitals Blood pressure 137/81, pulse 64, temperature 97.6 F (36.4 C), temperature source Oral, resp. rate 20, height '5\' 9"'$  (1.753 m), weight 189 lb 9.5 oz (86 kg), SpO2 95 %.  Filed Weights   06/06/16 1629 06/08/16 0348  Weight: 193 lb 4.8 oz (87.7 kg) 189 lb 9.5 oz (86 kg)    Diagnostic Studies/Procedures    Coronary Stent Intervention  Left Heart Cath and Coronary Angiography  Conclusion     RPDA lesion, 75 %stenosed.  Ost LM lesion, 40 %stenosed.  1st Mrg lesion, 60 %stenosed.  Prox LAD to Dist LAD lesion, 10 %stenosed.  Dist LAD lesion, 40 %stenosed.  2nd Diag lesion, 80 %stenosed.  1st Diag lesion, 80 %stenosed.  Dist RCA lesion, 90 %stenosed.  Ost RPDA to RPDA lesion, 95 %stenosed.  Mid RCA lesion, 40 %stenosed.  Prox RCA to Mid RCA lesion, 40 %stenosed.  Mid Cx lesion, 60 %stenosed.  The left ventricular systolic function is normal.  LV end diastolic pressure is normal.  The left ventricular ejection fraction is 55-65% by visual estimate.  There is no mitral valve regurgitation.  1. Triple vessel CAD 2. Severe stenosis ostium of the moderate caliber posterolateral branch. Successful PTCA/DES x 1 proximal posterolateral artery.  3. Severe stenosis small to moderate caliber PDA. Successful PTCA with balloon angioplasty only proximal PDA.  4. Patent stent mid LAD.  5.  Diffuse disease in the small caliber diagonal branches.  6. Normal LV systolic function 7. Mild left main stenosis.   Recommendations: Continue DAPT for at least one year. Continue statin, beta blocker, Norvasc and Ranexa.    Diagnostic Diagram       _____________  Labs & Radiologic Studies    CBC  Recent Labs  06/06/16 1817 06/07/16 0425 06/08/16 0341  WBC 3.8* 3.9* 3.6*  NEUTROABS 2.0  --   --   HGB 13.3 12.4* 12.5*  HCT 40.5 36.8*  37.1*  MCV 86.2 85.8 85.5  PLT 143* 121* 785*   Basic Metabolic Panel  Recent Labs  06/06/16 1817 06/08/16 0341  NA 139 140  K 4.1 3.9  CL 106 106  CO2 22 28  GLUCOSE 132* 93  BUN 14 11  CREATININE 0.82 0.80  CALCIUM 9.4 8.9  MG 2.0  --    Liver Function Tests  Recent Labs  06/06/16 1817  AST 30  ALT 15*  ALKPHOS 82  BILITOT 0.8  PROT 6.5  ALBUMIN 4.2   No results for input(s): LIPASE, AMYLASE in the last 72 hours. Cardiac Enzymes  Recent Labs  06/06/16 1817 06/06/16 2147 06/07/16 0425  TROPONINI <0.03 <0.03 <0.03   BNP Invalid input(s): POCBNP D-Dimer No results for input(s): DDIMER in the last 72 hours. Hemoglobin A1C  Recent Labs  06/06/16 1818  HGBA1C 7.1*   Fasting Lipid Panel  Recent Labs  06/07/16 0425  CHOL 87  HDL 35*  LDLCALC 37  TRIG 74  CHOLHDL 2.5   Thyroid Function Tests  Recent Labs  06/06/16 1818  TSH 2.009   _____________  X-ray Chest Pa And Lateral  Result Date: 06/07/2016 CLINICAL DATA:  Acute onset of right-sided chest pain and nausea. Initial encounter. EXAM: CHEST  2 VIEW COMPARISON:  Chest radiograph performed 11/12/2015 FINDINGS: The lungs are well-aerated and clear. There is no evidence of focal opacification, pleural effusion or pneumothorax. The heart is normal in size; the mediastinal contour is within normal limits. No acute osseous abnormalities are seen. A right shoulder arthroplasty is incompletely imaged, but appears grossly unremarkable. Anterior bridging osteophytes are noted along the lower thoracic spine. IMPRESSION: No acute cardiopulmonary process seen. Electronically Signed   By: Garald Balding M.D.   On: 06/07/2016 00:46   Disposition   Pt is being discharged home today in good condition.  Follow-up Plans & Appointments    Follow-up Information    Truitt Merle, NP Follow up on 06/15/2016.   Specialties:  Nurse Practitioner, Interventional Cardiology, Cardiology, Radiology Why:  at 2:30 for  cardiology hospital follow up.  Contact information: [address] [place] [address] [phone]          Discharge Instructions    Amb Referral to Cardiac Rehabilitation    Complete by:  As directed    Diagnosis:   Coronary Stents PTCA     Diet - low sodium heart healthy    Complete by:  As directed    Discharge instructions    Complete by:  As directed    PLEASE REMEMBER TO BRING ALL OF YOUR MEDICATIONS TO EACH OF YOUR FOLLOW-UP OFFICE VISITS.  PLEASE ATTEND ALL SCHEDULED FOLLOW-UP APPOINTMENTS.   Activity: Increase activity slowly as tolerated. You may shower, but no soaking baths (or swimming) for 1 week. No driving for 24 hours. No lifting over 5 lbs for 1 week. No sexual activity for 1 week.   You May Return to Work:  in 1 week (if applicable)  Wound Care: You may wash cath site gently with soap and water. Keep cath site clean and dry. If you notice pain, swelling, bleeding or pus at your cath site, please call [phone].   Increase activity slowly    Complete by:  As directed       Discharge Medications   Current Discharge Medication List    CONTINUE these medications which have CHANGED   Details  acetaminophen (TYLENOL) 500 MG tablet Take 2 tablets (1,000 mg total) by mouth every 8 (eight) hours as needed. Qty: 30 tablet, Refills: 0    metFORMIN (GLUCOPHAGE-XR) 500 MG 24 hr tablet Take 2 tablets (1,000 mg total) by mouth 2 (two) times daily.      CONTINUE these medications which have NOT CHANGED   Details  ALPRAZolam (XANAX) 0.5 MG tablet Take 0.5 mg by mouth daily as needed for anxiety.     amLODipine (NORVASC) 5 MG tablet Take 1 tablet (5 mg total) by mouth 2 (two) times daily. Qty: 180 tablet, Refills: 3    aspirin 81 MG tablet Take 81 mg by mouth at bedtime.     atenolol (TENORMIN) 50 MG tablet Take 50 mg by mouth at bedtime.    atorvastatin (LIPITOR) 80 MG tablet Take 40 mg by mouth daily at 6 PM.     canagliflozin  (INVOKANA) 300 MG TABS tablet Take 300 mg by mouth daily before breakfast.    Cholecalciferol (VITAMIN D PO) Take 1 capsule by mouth daily.    clopidogrel (PLAVIX) 75 MG tablet Take 1 tablet (75 mg total) by mouth daily with breakfast. Qty: 90 tablet, Refills: 1    docusate sodium (COLACE) 100 MG capsule Take 1 capsule (100 mg total) by mouth 2 (two) times daily. Qty: 10 capsule, Refills: 0    gabapentin (NEURONTIN) 300 MG capsule Take 300 mg by mouth 2 (two) times daily.     hydrochlorothiazide (MICROZIDE) 12.5 MG capsule Take 12.5 mg by mouth every Monday, Wednesday, and Friday.    irbesartan (AVAPRO) 300 MG tablet Take 0.5 tablets (150 mg total) by mouth daily. Qty: 90 tablet, Refills: 3    isosorbide mononitrate (IMDUR) 30 MG 24 hr tablet TAKE 1 TABLET BY MOUTH TWICE (2) DAILY Qty: 180 tablet, Refills: 2    nitroGLYCERIN (NITROSTAT) 0.4 MG SL tablet Place 1 tablet (0.4 mg total) under the tongue every 5 (five) minutes as needed. For chest pain Qty: 25 tablet, Refills: 5   Associated Diagnoses: Chest tightness    pantoprazole (PROTONIX) 40 MG tablet Take 40 mg by mouth 2 (two) times daily.    polyethylene glycol (MIRALAX / GLYCOLAX) packet Take 17 g by mouth 2 (two) times daily. Qty: 14 each, Refills: 0    ranolazine (RANEXA) 500 MG 12 hr tablet Take 1 tablet (500 mg total) by mouth 2 (two) times daily. Qty: 60 tablet, Refills: 6    traMADol (ULTRAM) 50 MG tablet Take 1-2 tablets (50-100 mg total) by mouth every 6 (six) hours as needed. Qty: 40 tablet, Refills: 0      STOP taking these medications     acetaminophen (TYLENOL) 650 MG CR tablet            Outstanding Labs/Studies   None  Duration of Discharge Encounter   Greater than 30 minutes including physician time.  Signed, Daune Perch NP 06/08/2016, 11:14 AM

## 2016-06-08 NOTE — Progress Notes (Signed)
CARDIAC REHAB PHASE I   PRE:  Rate/Rhythm: 78 SR  BP:  Sitting: 150/73      MODE:  Ambulation: 800 ft   POST:  Rate/Rhythm: 78 SR  BP:  Sitting: 175/79       Pt ambulated 800 ft on RA, independent, steady gait, tolerated well with no complaints. Completed PCI /stent education with pt and wife at bedside.  Reviewed risk factors, PCI book, anti-platelet therapy, stent card, activity restrictions, ntg, exercise, heart healthy diet, carb counting, and phase 2 cardiac rehab. Pt verbalized understanding, very receptive. Pt agrees to phase 2 cardiac rehab referral, will send to [facility] per pt request. Pt to recliner after walk, call bell within reach.     [phone] Lenna Sciara, RN, BSN 06/08/2016 8:52 AM

## 2016-06-10 ENCOUNTER — Telehealth (HOSPITAL_COMMUNITY): Payer: Self-pay | Admitting: Internal Medicine

## 2016-06-10 NOTE — Telephone Encounter (Signed)
S/w LaShandra with [employer]. Verifying insurance benefits. Copay $15.00 No Coinsurance or Deductible. Out of Pocket $3400.00, pt has met $340.00. Reference #[id number].... KJ

## 2016-06-14 NOTE — Progress Notes (Signed)
CARDIOLOGY OFFICE NOTE  Date:  06/15/2016    [name] Date of Birth: Dec 28, 1945 Medical Record #[id number]  PCP:  Jerlyn Ly, MD  Cardiologist:  [facility]    Chief Complaint  Patient presents with  . Coronary Artery Disease    Post hospital visit - seen for Dr. Acie Fredrickson    History of Present Illness: Leon Montoya. is a 71 y.o. male who presents today for a post hospital visit. Seen for Dr. Acie Fredrickson.   He has a history of CAD, HTN, HLD, DM and OSA.   He presented back in the spring of 2015 with a 2 month history of exertional chest pain - had abnormal Myoview - then cathed and had DES x 3 to the LAD. Otherwise had moderate but nonobstructive disease With a normal EF of 55 to 60%. Dscharged on Plavix. After his initial event - he went back to the ER with chest pain and was recathed - to continue with medical management. He has not tolerated [facility] active NTG in the past despite several attempts.   In November of 2015 - he went hunting with his son out of state. He had been cutting trees and setting up stands. Developed SSCP that initially resolved but then returned, subsequently associated with SOB and nausea - ended up going to the hospital in [place] (after first presenting to an urgent care like facility that did not offer cardiac cath) - was recathed - actually twice. Had normal left heart pressures, patent stents extending from the proximal to the distal LAD and severe diffuse disease involving the branches of all 3 major epicardial vessels. No clear culprit lesion to intervene on - then had Myoview which showed normal perfusion and normal EF at 59%. Was recathed and had FFR of the first OM, posterior descending and distal RCA which were nonsignificant - no PCI was performed. CXR was normal. Echo was also updated - normal EF - mildly dilated aorta noted with mild LVH. I did not get a discharge summary - they brought a RX in for 1000 mg of Ranexa - this was not  filled.  I saw him back after his return from [place]. He reiterated his story again about what happened to him in [place]. He did probably over exert him self - had walked 6 1/2 miles in the process of putting up the stands/cutting trees, etc. Had pretty classic chest pain associated with dyspnea and feeling of "impending doom". He notes that his symptoms seemed to abate after the second cath. He did take the higher dose of Ranexa but had some side effects (low BP, couldn't think, stomach upset, dizziness) and cut the dose back - he has remained on this dose.   Since that trip - he had had some fleeting pain - he did bring his cath CDs and these were reviewed by Dr. Burt Knack. He agreed with continued medical therapy. Films were loaded in the [facility] system.   In the hospital back in January of 2017 with angina - low risk Myoview noted. Continued on medical management.  Rechallenged him with Imdur. Has not wanted to increase Ranexa due to cost. When seenback in June of 2017 - he was not doing well - having more angina - ended up getting him recathed. Medical management continued. Saw Dr. Acie Fredrickson in October - tried again to increase his Ranexa but was not able to tolerate - again. He thought that at some point, [facility] would need  to have bypass surgery.   Less angina noted on visit from January.   Then seen as a work in about 2 weeks ago - active chest pain at rest - ended up admitting him - was recathed and had PCI by Dr. Angelena Form. It is surprising how well his exercise tolerance is and that his angina is typically always at rest.   Comes in today. Here with his wife [name]. I am seeing her today as well. He is doing well. Feeling much better. No more chest pain but "had one pain this morning" - sharp - lasted maybe a minute or so. But he notes that immediately after his PCI that he was better. Each day seems "to be getting better". BP has improved. Still worried about the costs of his medicines -  nearing the doughnut hole. He did have some issues with his care - talked about in depth today. Overall, he does seem like he is better.   Past Medical History:  Diagnosis Date  . Arthritis    "lower back; right shoulder; left thumb; joints" (06/21/2013)  . Asthma    "not sure if this is true or not" (06/21/2013)  . CAD (coronary artery disease) May 2015   s/p PTCA/DES x 3 to mid LAD, mod non-ob disease in Cx and RCA May 2015; s/p repeat caths - last study in November of 2015 - stents patent, - FFR - managed medically.   [name] GERD (gastroesophageal reflux disease)   . Gout    "maybe twice in my life"  . Headache(784.0)    "weekly for the last 3-4 months" (06/21/2013)  . Hyperlipidemia   . Hypertension   . Myocardial infarction [facility])    "/Dr. Katharina Caper I've had one between 2014-2015) (06/21/2013)  . Osteoarthritis   . Sleep apnea    WEIGHT LOSS, NO LONGER NEEDS PER PATIENT  . Type II diabetes mellitus ([place])    TYPE 2    Past Surgical History:  Procedure Laterality Date  . CARDIAC CATHETERIZATION  1980's   "once"  . CARDIAC CATHETERIZATION N/A 07/23/2015   Procedure: Left Heart Cath and Coronary Angiography;  Surgeon: Sherren Mocha, MD;  Location: [place] [facility];  Service: Cardiovascular;  Laterality: N/A;  . CARDIOVASCULAR STRESS TEST  06/10/2008   EF 68%  . CARPAL TUNNEL RELEASE Bilateral   . CORONARY ANGIOPLASTY WITH STENT PLACEMENT  06/21/2013   "3"  . CORONARY STENT INTERVENTION N/A 06/07/2016   Procedure: Coronary Stent Intervention;  Surgeon: Burnell Blanks, MD;  Location: [place] [facility];  Service: Cardiovascular;  Laterality: N/A;  . EXCISION MORTON'S NEUROMA Left   . EYE SURGERY Left    "removed film over my eye"  . FRACTIONAL FLOW RESERVE WIRE N/A 06/26/2013   Procedure: FRACTIONAL FLOW RESERVE WIRE;  Surgeon: [name], MD;  Location: [place] [facility];  Service: Cardiovascular;  Laterality: N/A;  . HERNIA REPAIR Left   . JOINT REPLACEMENT Left 03/2013   "thumb"  .  LEFT HEART CATH AND CORONARY ANGIOGRAPHY N/A 06/07/2016   Procedure: Left Heart Cath and Coronary Angiography;  Surgeon: Burnell Blanks, MD;  Location: [place] [facility];  Service: Cardiovascular;  Laterality: N/A;  . LEFT HEART CATHETERIZATION WITH CORONARY ANGIOGRAM N/A 06/21/2013   Procedure: LEFT HEART CATHETERIZATION WITH CORONARY ANGIOGRAM;  Surgeon: Burnell Blanks, MD;  Location: [facility] CATH LAB;  Service: Cardiovascular;  Laterality: N/A;  . LEFT HEART CATHETERIZATION WITH CORONARY ANGIOGRAM N/A 06/26/2013   Procedure: LEFT HEART CATHETERIZATION  WITH CORONARY ANGIOGRAM;  Surgeon: [name], MD;  Location: [facility] CATH LAB;  Service: Cardiovascular;  Laterality: N/A;  . TONSILLECTOMY    . TOTAL HIP ARTHROPLASTY Right 04/12/2016   Procedure: RIGHT TOTAL HIP ARTHROPLASTY ANTERIOR APPROACH;  Surgeon: Paralee Cancel, MD;  Location: [facility];  Service: Orthopedics;  Laterality: Right;  requests 70 mins  . TOTAL SHOULDER ARTHROPLASTY  03/01/2012   Procedure: TOTAL SHOULDER ARTHROPLASTY;  Surgeon: Marin Shutter, MD;  Location: [facility];  Service: Orthopedics;  Laterality: Right;  . UMBILICAL HERNIA REPAIR       Medications: Current Outpatient Prescriptions  Medication Sig Dispense Refill  . acetaminophen (TYLENOL) 500 MG tablet Take 2 tablets (1,000 mg total) by mouth every 8 (eight) hours as needed. 30 tablet 0  . ALPRAZolam (XANAX) 0.5 MG tablet Take 0.5 mg by mouth daily as needed for anxiety.     [name] amLODipine (NORVASC) 5 MG tablet Take 1 tablet (5 mg total) by mouth 2 (two) times daily. 180 tablet 3  . aspirin 81 MG tablet Take 81 mg by mouth at bedtime.     [name] atenolol (TENORMIN) 50 MG tablet Take 1 tablet (50 mg total) by mouth at bedtime. 90 tablet 3  . atorvastatin (LIPITOR) 80 MG tablet Take 40 mg by mouth daily at 6 PM.     . canagliflozin (INVOKANA) 300 MG TABS tablet Take 300 mg by mouth daily before breakfast.    . Cholecalciferol (VITAMIN D PO) Take 1 capsule by mouth daily.     . clopidogrel (PLAVIX) 75 MG tablet Take 1 tablet (75 mg total) by mouth daily with breakfast. 90 tablet 3  . docusate sodium (COLACE) 100 MG capsule Take 100 mg by mouth daily as needed for mild constipation.    . gabapentin (NEURONTIN) 300 MG capsule Take 300 mg by mouth daily.    . hydrochlorothiazide (MICROZIDE) 12.5 MG capsule Take 1 capsule (12.5 mg total) by mouth every Monday, Wednesday, and Friday. 36 capsule 3  . irbesartan (AVAPRO) 300 MG tablet Take 0.5 tablets (150 mg total) by mouth daily. 90 tablet 3  . isosorbide mononitrate (IMDUR) 30 MG 24 hr tablet TAKE 1 TABLET BY MOUTH TWICE (2) DAILY 180 tablet 2  . metFORMIN (GLUCOPHAGE-XR) 500 MG 24 hr tablet Take 2 tablets (1,000 mg total) by mouth 2 (two) times daily.    . nitroGLYCERIN (NITROSTAT) 0.4 MG SL tablet Place 1 tablet (0.4 mg total) under the tongue every 5 (five) minutes as needed. For chest pain 25 tablet 5  . pantoprazole (PROTONIX) 40 MG tablet Take 40 mg by mouth 2 (two) times daily.    . polyethylene glycol (MIRALAX / GLYCOLAX) packet Take 17 g by mouth daily as needed for mild constipation.    . ranolazine (RANEXA) 500 MG 12 hr tablet Take 1 tablet (500 mg total) by mouth 2 (two) times daily. 60 tablet 6  . traMADol (ULTRAM) 50 MG tablet Take 1-2 tablets (50-100 mg total) by mouth every 6 (six) hours as needed. 40 tablet 0   No current facility-administered medications for this visit.     Allergies: No Known Allergies  Social History: The patient  reports that he has never smoked. He has never used smokeless tobacco. He reports that he does not drink alcohol or use drugs.   Family History: The patient's family history includes Coronary artery disease in his cousin; Diabetes type II in his mother; Heart attack in his father; Heart disease in his brother.  Review of Systems: Please see the history of present illness.   Otherwise, the review of systems is positive for none.   All other systems are reviewed and  negative.   Physical Exam: VS:  BP 118/76 (BP Location: Left Arm, Patient Position: Sitting, Cuff Size: Normal)   Pulse 62   Ht '5\' 9"'$  (1.753 m)   Wt 191 lb 12.8 oz (87 kg)   SpO2 96% Comment: at rest  BMI 28.32 kg/m  .  BMI Body mass index is 28.32 kg/m.  Wt Readings from Last 3 Encounters:  06/15/16 191 lb 12.8 oz (87 kg)  06/08/16 189 lb 9.5 oz (86 kg)  06/06/16 197 lb (89.4 kg)    General: Pleasant. Well developed, well nourished and in no acute distress.   HEENT: Normal.  Neck: Supple, no JVD, carotid bruits, or masses noted.  Cardiac: Regular rate and rhythm. No murmurs, rubs, or gallops. No edema.  Respiratory:  Lungs are clear to auscultation bilaterally with normal work of breathing.  GI: Soft and nontender.  MS: No deformity or atrophy. Gait and ROM intact.  Skin: Warm and dry. Color is normal.  Neuro:  Strength and sensation are intact and no gross focal deficits noted.  Psych: Alert, appropriate and with normal affect. His right wrist cath site is fine.   LABORATORY DATA:  EKG:  EKG is not ordered today.  Lab Results  Component Value Date   WBC 3.6 (L) 06/08/2016   HGB 12.5 (L) 06/08/2016   HCT 37.1 (L) 06/08/2016   PLT 112 (L) 06/08/2016   GLUCOSE 93 06/08/2016   CHOL 87 06/07/2016   TRIG 74 06/07/2016   HDL 35 (L) 06/07/2016   LDLCALC 37 06/07/2016   ALT 15 (L) 06/06/2016   AST 30 06/06/2016   NA 140 06/08/2016   K 3.9 06/08/2016   CL 106 06/08/2016   CREATININE 0.80 06/08/2016   BUN 11 06/08/2016   CO2 28 06/08/2016   TSH 2.009 06/06/2016   INR 0.98 06/06/2016   HGBA1C 7.1 (H) 06/06/2016    BNP (last 3 results)  Recent Labs  06/06/16 1818  BNP 54.3    ProBNP (last 3 results) No results for input(s): PROBNP in the last 8760 hours.   Other Studies Reviewed Today: Procedures   Coronary Stent Intervention 05/2016  Left Heart Cath and Coronary Angiography  Conclusion     RPDA lesion, 75 %stenosed.  Ost LM lesion, 40  %stenosed.  1st Mrg lesion, 60 %stenosed.  Prox LAD to Dist LAD lesion, 10 %stenosed.  Dist LAD lesion, 40 %stenosed.  2nd Diag lesion, 80 %stenosed.  1st Diag lesion, 80 %stenosed.  Dist RCA lesion, 90 %stenosed.  Ost RPDA to RPDA lesion, 95 %stenosed.  Mid RCA lesion, 40 %stenosed.  Prox RCA to Mid RCA lesion, 40 %stenosed.  Mid Cx lesion, 60 %stenosed.  The left ventricular systolic function is normal.  LV end diastolic pressure is normal.  The left ventricular ejection fraction is 55-65% by visual estimate.  There is no mitral valve regurgitation.   1. Triple vessel CAD 2. Severe stenosis ostium of the moderate caliber posterolateral branch. Successful PTCA/DES x 1 proximal posterolateral artery.  3. Severe stenosis small to moderate caliber PDA. Successful PTCA with balloon angioplasty only proximal PDA.  4. Patent stent mid LAD.  5. Diffuse disease in the small caliber diagonal branches.  6. Normal LV systolic function 7. Mild left main stenosis.   Recommendations: Continue DAPT for at least one year.  Continue statin, beta blocker, Norvasc and Ranexa.       Assessment/Plan:  1. CAD with history of chronic angina - recent cath 2 weeks ago due to escalation of symptoms - s/p PCI with DES x 1 to the proximal PLA and balloon PCI to the proximal PDA.  Overall he seems improved. No change with current regimen. Lab today.   2. HTN - BP much better.    3. HLD - on statin - labs typically followed by PCP. His most recent lipid panel shows LDL of 37. Don't really see a role for PCSK9 therapy at this time but I told him that if angina/recurrent caths become a more frequent occurrence - then this needs to be considered - again - he is worried about the cost.   4. DM - followed by Dr. Joylene Draft.  5. Previously noted mildly dilated aortic root/ascending aorta - not seen on our last echo from May 2015 - would follow for now.    Current medicines are reviewed with  the patient today.  The patient does not have concerns regarding medicines other than what has been noted above.  The following changes have been made:  See above.  Labs/ tests ordered today include:    Orders Placed This Encounter  Procedures  . Basic metabolic panel  . CBC     Disposition:   FU with me in 3 months.   Patient is agreeable to this plan and will call if any problems develop in the interim.   SignedTruitt Merle, NP  06/15/2016 11:17 AM  [facility] [address] [place], [place]  [address] Phone: [phone] Fax: [phone]

## 2016-06-15 ENCOUNTER — Encounter: Payer: Self-pay | Admitting: Nurse Practitioner

## 2016-06-15 ENCOUNTER — Ambulatory Visit: Payer: PPO | Admitting: Nurse Practitioner

## 2016-06-15 ENCOUNTER — Ambulatory Visit (INDEPENDENT_AMBULATORY_CARE_PROVIDER_SITE_OTHER): Payer: PPO | Admitting: Nurse Practitioner

## 2016-06-15 VITALS — BP 118/76 | HR 62 | Ht 69.0 in | Wt 191.8 lb

## 2016-06-15 DIAGNOSIS — I259 Chronic ischemic heart disease, unspecified: Secondary | ICD-10-CM | POA: Diagnosis not present

## 2016-06-15 DIAGNOSIS — I2510 Atherosclerotic heart disease of native coronary artery without angina pectoris: Secondary | ICD-10-CM | POA: Diagnosis not present

## 2016-06-15 DIAGNOSIS — R0789 Other chest pain: Secondary | ICD-10-CM | POA: Diagnosis not present

## 2016-06-15 LAB — BASIC METABOLIC PANEL
BUN/Creatinine Ratio: 19 (ref 10–24)
BUN: 19 mg/dL (ref 8–27)
CO2: 21 mmol/L (ref 18–29)
Calcium: 10.2 mg/dL (ref 8.6–10.2)
Chloride: 99 mmol/L (ref 96–106)
Creatinine, Ser: 0.98 mg/dL (ref 0.76–1.27)
GFR calc Af Amer: 91 mL/min/{1.73_m2} (ref >59)
GFR calc non Af Amer: 78 mL/min/{1.73_m2} (ref >59)
Glucose: 136 mg/dL — ABNORMAL HIGH (ref 65–99)
Potassium: 4.3 mmol/L (ref 3.5–5.2)
Sodium: 141 mmol/L (ref 134–144)

## 2016-06-15 LAB — CBC
Hematocrit: 39.9 % (ref 37.5–51.0)
Hemoglobin: 13.9 g/dL (ref 13.0–17.7)
MCH: 29.3 pg (ref 26.6–33.0)
MCHC: 34.8 g/dL (ref 31.5–35.7)
MCV: 84 fL (ref 79–97)
Platelets: 146 10*3/uL — ABNORMAL LOW (ref 150–379)
RBC: 4.74 x10E6/uL (ref 4.14–5.80)
RDW: 12.9 % (ref 12.3–15.4)
WBC: 4.6 10*3/uL (ref 3.4–10.8)

## 2016-06-15 MED ORDER — CLOPIDOGREL BISULFATE 75 MG PO TABS: 75.0000 mg | ORAL_TABLET | Freq: Every day | ORAL | 3 refills | Status: DC

## 2016-06-15 MED ORDER — HYDROCHLOROTHIAZIDE 12.5 MG PO CAPS: 12.5000 mg | ORAL_CAPSULE | ORAL | 3 refills | Status: DC

## 2016-06-15 MED ORDER — AMLODIPINE BESYLATE 5 MG PO TABS: 5.0000 mg | ORAL_TABLET | Freq: Two times a day (BID) | ORAL | 3 refills | Status: DC

## 2016-06-15 MED ORDER — NITROGLYCERIN 0.4 MG SL SUBL: 0.4000 mg | SUBLINGUAL_TABLET | SUBLINGUAL | 5 refills | Status: DC | PRN

## 2016-06-15 MED ORDER — ATENOLOL 50 MG PO TABS: 50.0000 mg | ORAL_TABLET | Freq: Every day | ORAL | 3 refills | Status: DC

## 2016-06-15 NOTE — Patient Instructions (Addendum)
We will be checking the following labs today - BMET and CBC   Medication Instructions:    Continue with your current medicines.   Your refills have been sent in.     Testing/Procedures To Be Arranged:  N/A  Follow-Up:   See me in 3 months    Other Special Instructions:   N/A    If you need a refill on your cardiac medications before your next appointment, please call your pharmacy.   Call the [place] office at [phone] if you have any questions, problems or concerns.

## 2016-06-29 DIAGNOSIS — Z471 Aftercare following joint replacement surgery: Secondary | ICD-10-CM | POA: Diagnosis not present

## 2016-06-29 DIAGNOSIS — Z96641 Presence of right artificial hip joint: Secondary | ICD-10-CM | POA: Diagnosis not present

## 2016-06-30 ENCOUNTER — Other Ambulatory Visit: Payer: Self-pay | Admitting: *Deleted

## 2016-06-30 MED ORDER — RANOLAZINE ER 500 MG PO TB12: 500.0000 mg | ORAL_TABLET | Freq: Two times a day (BID) | ORAL | 3 refills | Status: DC

## 2016-07-01 ENCOUNTER — Other Ambulatory Visit: Payer: Self-pay | Admitting: *Deleted

## 2016-07-01 MED ORDER — RANOLAZINE ER 500 MG PO TB12: 500.0000 mg | ORAL_TABLET | Freq: Two times a day (BID) | ORAL | 3 refills | Status: DC

## 2016-07-06 ENCOUNTER — Telehealth (HOSPITAL_COMMUNITY): Payer: Self-pay

## 2016-07-06 NOTE — Telephone Encounter (Signed)
I called and left message on patient voicemail to call office about scheduling for cardiac rehab. I left office contact information on patient voicemail to return call.  ° °

## 2016-07-08 ENCOUNTER — Emergency Department (HOSPITAL_COMMUNITY): Payer: PPO

## 2016-07-08 ENCOUNTER — Encounter (HOSPITAL_COMMUNITY): Payer: Self-pay

## 2016-07-08 DIAGNOSIS — Z96641 Presence of right artificial hip joint: Secondary | ICD-10-CM | POA: Insufficient documentation

## 2016-07-08 DIAGNOSIS — M549 Dorsalgia, unspecified: Secondary | ICD-10-CM | POA: Diagnosis not present

## 2016-07-08 DIAGNOSIS — Z7984 Long term (current) use of oral hypoglycemic drugs: Secondary | ICD-10-CM | POA: Insufficient documentation

## 2016-07-08 DIAGNOSIS — Z7982 Long term (current) use of aspirin: Secondary | ICD-10-CM | POA: Diagnosis not present

## 2016-07-08 DIAGNOSIS — J45909 Unspecified asthma, uncomplicated: Secondary | ICD-10-CM | POA: Diagnosis not present

## 2016-07-08 DIAGNOSIS — R072 Precordial pain: Secondary | ICD-10-CM | POA: Diagnosis not present

## 2016-07-08 DIAGNOSIS — Z955 Presence of coronary angioplasty implant and graft: Secondary | ICD-10-CM | POA: Insufficient documentation

## 2016-07-08 DIAGNOSIS — I252 Old myocardial infarction: Secondary | ICD-10-CM | POA: Diagnosis not present

## 2016-07-08 DIAGNOSIS — R079 Chest pain, unspecified: Secondary | ICD-10-CM | POA: Diagnosis not present

## 2016-07-08 DIAGNOSIS — I2510 Atherosclerotic heart disease of native coronary artery without angina pectoris: Secondary | ICD-10-CM | POA: Insufficient documentation

## 2016-07-08 DIAGNOSIS — E119 Type 2 diabetes mellitus without complications: Secondary | ICD-10-CM | POA: Insufficient documentation

## 2016-07-08 DIAGNOSIS — I10 Essential (primary) hypertension: Secondary | ICD-10-CM | POA: Insufficient documentation

## 2016-07-08 LAB — BASIC METABOLIC PANEL
Anion gap: 8 (ref 5–15)
BUN: 20 mg/dL (ref 6–20)
CHLORIDE: 106 mmol/L (ref 101–111)
CO2: 23 mmol/L (ref 22–32)
Calcium: 9.5 mg/dL (ref 8.9–10.3)
Creatinine, Ser: 1.12 mg/dL (ref 0.61–1.24)
GFR calc Af Amer: >60 mL/min (ref >60)
GFR calc non Af Amer: >60 mL/min (ref >60)
Glucose, Bld: 330 mg/dL — ABNORMAL HIGH (ref 65–99)
POTASSIUM: 3.7 mmol/L (ref 3.5–5.1)
SODIUM: 137 mmol/L (ref 135–145)

## 2016-07-08 LAB — I-STAT TROPONIN, ED: Troponin i, poc: 0 ng/mL (ref 0.00–0.08)

## 2016-07-08 LAB — CBC
HEMATOCRIT: 40.9 % (ref 39.0–52.0)
Hemoglobin: 13.6 g/dL (ref 13.0–17.0)
MCH: 28.6 pg (ref 26.0–34.0)
MCHC: 33.3 g/dL (ref 30.0–36.0)
MCV: 86.1 fL (ref 78.0–100.0)
Platelets: 139 10*3/uL — ABNORMAL LOW (ref 150–400)
RBC: 4.75 MIL/uL (ref 4.22–5.81)
RDW: 13.5 % (ref 11.5–15.5)
WBC: 3.2 10*3/uL — ABNORMAL LOW (ref 4.0–10.5)

## 2016-07-08 NOTE — ED Triage Notes (Signed)
Pt complaining of L chest pain that radiates to L shoulder and back x 2 days. Pt states had stent placed 3 weeks ago. Pt states some improvement with nitro. Pt states increased fatigue x 2 days.

## 2016-07-09 ENCOUNTER — Encounter (HOSPITAL_COMMUNITY): Payer: Self-pay | Admitting: Emergency Medicine

## 2016-07-09 ENCOUNTER — Emergency Department (HOSPITAL_COMMUNITY): Payer: PPO

## 2016-07-09 ENCOUNTER — Emergency Department (HOSPITAL_COMMUNITY)
Admission: EM | Admit: 2016-07-09 | Discharge: 2016-07-09 | Disposition: A | Discharge status: Home / Self Care | Payer: PPO | Attending: Emergency Medicine | Admitting: Emergency Medicine

## 2016-07-09 DIAGNOSIS — R072 Precordial pain: Secondary | ICD-10-CM

## 2016-07-09 DIAGNOSIS — M549 Dorsalgia, unspecified: Secondary | ICD-10-CM | POA: Diagnosis not present

## 2016-07-09 LAB — I-STAT TROPONIN, ED: Troponin i, poc: 0 ng/mL (ref 0.00–0.08)

## 2016-07-09 MED ORDER — NITROGLYCERIN 0.4 MG SL SUBL
0.4000 mg | SUBLINGUAL_TABLET | SUBLINGUAL | Status: DC | PRN
  Administered 2016-07-09: 0.4 mg via SUBLINGUAL
  Filled 2016-07-09: qty 1

## 2016-07-09 MED ORDER — KETOROLAC TROMETHAMINE 30 MG/ML IJ SOLN
15.0000 mg | Freq: Once | INTRAMUSCULAR | Status: AC
  Administered 2016-07-09: 15 mg via INTRAVENOUS
  Filled 2016-07-09: qty 1

## 2016-07-09 MED ORDER — DICLOFENAC SODIUM 1 % TD GEL: 4.0000 g | Freq: Four times a day (QID) | TRANSDERMAL | 0 refills | Status: DC

## 2016-07-09 MED ORDER — IOPAMIDOL (ISOVUE-370) INJECTION 76%
INTRAVENOUS | Status: AC
  Administered 2016-07-09: 100 mL
  Filled 2016-07-09: qty 100

## 2016-07-09 MED ORDER — SODIUM CHLORIDE 0.9 % IV BOLUS (SEPSIS)
500.0000 mL | Freq: Once | INTRAVENOUS | Status: AC
  Administered 2016-07-09: 500 mL via INTRAVENOUS

## 2016-07-09 MED ORDER — GI COCKTAIL ~~LOC~~
30.0000 mL | Freq: Once | ORAL | Status: AC
  Administered 2016-07-09: 30 mL via ORAL
  Filled 2016-07-09: qty 30

## 2016-07-09 MED ORDER — FENTANYL CITRATE (PF) 100 MCG/2ML IJ SOLN
50.0000 ug | Freq: Once | INTRAMUSCULAR | Status: AC
  Administered 2016-07-09: 50 ug via INTRAVENOUS
  Filled 2016-07-09: qty 2

## 2016-07-09 NOTE — ED Notes (Signed)
Patient transported to CT 

## 2016-07-09 NOTE — ED Provider Notes (Signed)
[facility] DEPT Provider Note   CSN: [id number] Arrival date & time: 07/08/16  2212 By signing my name below, I, Dyke Brackett, attest that this documentation has been prepared under the direction and in the presence of Bland, Calen Geister, MD . Electronically Signed: Dyke Brackett, Scribe. 07/09/2016. 1:29 AM.   History   Chief Complaint Chief Complaint  Patient presents with  . Chest Pain   HPI Hunter Mcbride. is a 71 y.o. male with a history of CAD, HTN/HLD, DM, who presents to the Emergency Department complaining of waxing and waning left-sided chest pain onset 24 hours ago. Pt describes this as aching, burning pain that radiates to his left shoulder and back. He currently rates his pain 7/10 in severity. He took 2 nitroglycerin last night that relieved the pain for ~20 minutes until the pain gradually returned. He notes associated nausea and fatigue. He states this feels similar to his prior cardiac pain. He denies any increased flatulence or burping. No suspicious food intake. Pt denies any vomiting, SOB, diarrhea, constipation, or any other associated symptoms.   The history is provided by the patient. No language interpreter was used.  Chest Pain   This is a recurrent problem. The current episode started 2 days ago. The problem occurs constantly. The problem has not changed since onset.The pain is associated with raising an arm. The pain is present in the lateral region. The pain is moderate. The quality of the pain is described as dull. Associated symptoms include back pain. Pertinent negatives include no diaphoresis, no near-syncope, no numbness, no palpitations, no shortness of breath and no vomiting.   Past Medical History:  Diagnosis Date  . Arthritis    "lower back; right shoulder; left thumb; joints" (06/21/2013)  . Asthma    "not sure if this is true or not" (06/21/2013)  . CAD (coronary artery disease) May 2015   s/p PTCA/DES x 3 to mid LAD, mod non-ob disease in Cx and RCA  May 2015; s/p repeat caths - last study in November of 2015 - stents patent, - FFR - managed medically.   [name] GERD (gastroesophageal reflux disease)   . Gout    "maybe twice in my life"  . Headache(784.0)    "weekly for the last 3-4 months" (06/21/2013)  . Hyperlipidemia   . Hypertension   . Myocardial infarction [facility])    "/Dr. Katharina Caper I've had one between 2014-2015) (06/21/2013)  . Osteoarthritis   . Sleep apnea    WEIGHT LOSS, NO LONGER NEEDS PER PATIENT  . Type II diabetes mellitus ([place])    TYPE 2    Patient Active Problem List   Diagnosis Date Noted  . Overweight (BMI 25.0-29.9) 04/13/2016  . S/P right THA, AA 04/12/2016  . Angina at rest [facility]) 02/25/2015  . Type 2 diabetes mellitus treated without insulin ([employer])   . Coronary atherosclerosis of native coronary artery 06/27/2013  . Unstable angina ([place]) 06/21/2013  . Chest pain 06/20/2013  . Shoulder arthritis 03/02/2012  . Hypertension 04/11/2011  . Hyperlipidemia 04/11/2011  . Diabetes mellitus ([place]) 04/11/2011  . Chest tightness 10/13/2010    Past Surgical History:  Procedure Laterality Date  . CARDIAC CATHETERIZATION  1980's   "once"  . CARDIAC CATHETERIZATION N/A 07/23/2015   Procedure: Left Heart Cath and Coronary Angiography;  Surgeon: Sherren Mocha, MD;  Location: [facility] CV LAB;  Service: Cardiovascular;  Laterality: N/A;  . CARDIOVASCULAR STRESS TEST  06/10/2008   EF 68%  . CARPAL TUNNEL RELEASE Bilateral   .  CORONARY ANGIOPLASTY WITH STENT PLACEMENT  06/21/2013   "3"  . CORONARY STENT INTERVENTION N/A 06/07/2016   Procedure: Coronary Stent Intervention;  Surgeon: Burnell Blanks, MD;  Location: [place] CV LAB;  Service: Cardiovascular;  Laterality: N/A;  . EXCISION MORTON'S NEUROMA Left   . EYE SURGERY Left    "removed film over my eye"  . FRACTIONAL FLOW RESERVE WIRE N/A 06/26/2013   Procedure: FRACTIONAL FLOW RESERVE WIRE;  Surgeon: [name], MD;  Location: [place] CATH LAB;  Service:  Cardiovascular;  Laterality: N/A;  . HERNIA REPAIR Left   . JOINT REPLACEMENT Left 03/2013   "thumb"  . LEFT HEART CATH AND CORONARY ANGIOGRAPHY N/A 06/07/2016   Procedure: Left Heart Cath and Coronary Angiography;  Surgeon: Burnell Blanks, MD;  Location: [place] LAB;  Service: Cardiovascular;  Laterality: N/A;  . LEFT HEART CATHETERIZATION WITH CORONARY ANGIOGRAM N/A 06/21/2013   Procedure: LEFT HEART CATHETERIZATION WITH CORONARY ANGIOGRAM;  Surgeon: Burnell Blanks, MD;  Location: [facility] CATH LAB;  Service: Cardiovascular;  Laterality: N/A;  . LEFT HEART CATHETERIZATION WITH CORONARY ANGIOGRAM N/A 06/26/2013   Procedure: LEFT HEART CATHETERIZATION WITH CORONARY ANGIOGRAM;  Surgeon: [name], MD;  Location: [place] CATH LAB;  Service: Cardiovascular;  Laterality: N/A;  . TONSILLECTOMY    . TOTAL HIP ARTHROPLASTY Right 04/12/2016   Procedure: RIGHT TOTAL HIP ARTHROPLASTY ANTERIOR APPROACH;  Surgeon: Paralee Cancel, MD;  Location: [facility];  Service: Orthopedics;  Laterality: Right;  requests 70 mins  . TOTAL SHOULDER ARTHROPLASTY  03/01/2012   Procedure: TOTAL SHOULDER ARTHROPLASTY;  Surgeon: Marin Shutter, MD;  Location: [facility];  Service: Orthopedics;  Laterality: Right;  . UMBILICAL HERNIA REPAIR      Home Medications    Prior to Admission medications   Medication Sig Start Date End Date Taking? Authorizing Provider  acetaminophen (TYLENOL) 500 MG tablet Take 2 tablets (1,000 mg total) by mouth every 8 (eight) hours as needed. 06/08/16   Daune Perch, NP  ALPRAZolam Duanne Moron) 0.5 MG tablet Take 0.5 mg by mouth daily as needed for anxiety.     [provider]  amLODipine (NORVASC) 5 MG tablet Take 1 tablet (5 mg total) by mouth 2 (two) times daily. 06/15/16   Burtis Junes, NP  aspirin 81 MG tablet Take 81 mg by mouth at bedtime.     [provider]  atenolol (TENORMIN) 50 MG tablet Take 1 tablet (50 mg total) by mouth at bedtime. 06/15/16   Burtis Junes, NP   atorvastatin (LIPITOR) 80 MG tablet Take 40 mg by mouth daily at 6 PM.     [provider]  canagliflozin (INVOKANA) 300 MG TABS tablet Take 300 mg by mouth daily before breakfast.    [provider]  Cholecalciferol (VITAMIN D PO) Take 1 capsule by mouth daily.    [provider]  clopidogrel (PLAVIX) 75 MG tablet Take 1 tablet (75 mg total) by mouth daily with breakfast. 06/15/16   Burtis Junes, NP  docusate sodium (COLACE) 100 MG capsule Take 100 mg by mouth daily as needed for mild constipation.    [provider]  gabapentin (NEURONTIN) 300 MG capsule Take 300 mg by mouth daily.    [provider]  hydrochlorothiazide (MICROZIDE) 12.5 MG capsule Take 1 capsule (12.5 mg total) by mouth every Monday, Wednesday, and Friday. 06/15/16   Burtis Junes, NP  irbesartan (AVAPRO) 300 MG tablet Take 0.5 tablets (150 mg total) by mouth  daily. 02/25/15   Barton Dubois, MD  isosorbide mononitrate (IMDUR) 30 MG 24 hr tablet TAKE 1 TABLET BY MOUTH TWICE (2) DAILY 05/31/16   Burtis Junes, NP  metFORMIN (GLUCOPHAGE-XR) 500 MG 24 hr tablet Take 2 tablets (1,000 mg total) by mouth 2 (two) times daily. 06/08/16   Daune Perch, NP  nitroGLYCERIN (NITROSTAT) 0.4 MG SL tablet Place 1 tablet (0.4 mg total) under the tongue every 5 (five) minutes as needed. For chest pain 06/15/16   Burtis Junes, NP  pantoprazole (PROTONIX) 40 MG tablet Take 40 mg by mouth 2 (two) times daily.    [provider]  polyethylene glycol (MIRALAX / GLYCOLAX) packet Take 17 g by mouth daily as needed for mild constipation.    [provider]  ranolazine (RANEXA) 500 MG 12 hr tablet Take 1 tablet (500 mg total) by mouth 2 (two) times daily. 07/01/16   Nahser, Wonda Cheng, MD  traMADol (ULTRAM) 50 MG tablet Take 1-2 tablets (50-100 mg total) by mouth every 6 (six) hours as needed. 04/12/16   Danae Orleans, PA-C    Family History Family History  Problem Relation Age of  Onset  . Diabetes type II Mother   . Heart attack Father   . Coronary artery disease Cousin   . Heart disease Brother    Social History Social History  Substance Use Topics  . Smoking status: Never Smoker  . Smokeless tobacco: Never Used  . Alcohol use No    Allergies   Patient has no known allergies.  Review of Systems Review of Systems  Constitutional: Negative for diaphoresis.  Respiratory: Negative for shortness of breath.   Cardiovascular: Positive for chest pain. Negative for palpitations, leg swelling and near-syncope.  Gastrointestinal: Negative for constipation, diarrhea and vomiting.  Musculoskeletal: Positive for back pain and myalgias.  Neurological: Negative for numbness.  All other systems reviewed and are negative.  Physical Exam Updated Vital Signs BP (!) 148/84 (BP Location: Right Arm)   Pulse 72   Temp 98.2 F (36.8 C) (Oral)   Resp 18   Ht 5\' 9"  (1.753 m)   Wt 189 lb (85.7 kg)   SpO2 99%   BMI 27.91 kg/m   Physical Exam  Constitutional: He is oriented to person, place, and time. He appears well-developed and well-nourished. No distress.  HENT:  Head: Normocephalic and atraumatic.  Mouth/Throat: Oropharynx is clear and moist. No oropharyngeal exudate.  Moist mucous membranes   Eyes: Conjunctivae and EOM are normal. Pupils are equal, round, and reactive to light.  Neck: Normal range of motion. Neck supple. No JVD present.  Trachea midline No bruit  Cardiovascular: Normal rate, regular rhythm, normal heart sounds and intact distal pulses.   Pulmonary/Chest: Effort normal and breath sounds normal. No stridor. No respiratory distress. He has no wheezes. He has no rales. He exhibits tenderness.  Abdominal: Soft. Bowel sounds are normal. He exhibits no distension and no mass. There is no tenderness. There is no rebound and no guarding.  Extremely hyperactive bowel sounds  Musculoskeletal: Normal range of motion. He exhibits no edema, tenderness or  deformity.  Neurological: He is alert and oriented to person, place, and time. He has normal reflexes. He displays normal reflexes. He exhibits normal muscle tone. Coordination normal.  Skin: Skin is warm and dry. Capillary refill takes less than 2 seconds.  Psychiatric: He has a normal mood and affect. His behavior is normal.  Nursing note and vitals reviewed.  ED Treatments / Results  Vitals:   07/09/16 0500 07/09/16 0515  BP: 117/65 127/78  Pulse: (!) 48 (!) 48  Resp: 14 16  Temp:    ; DIAGNOSTIC STUDIES:  Oxygen Saturation is 99% on RA, normal by my interpretation.    COORDINATION OF CARE:  1:25 AM Discussed treatment plan with pt at bedside and pt agreed to plan.   Labs (all labs ordered are listed, but only abnormal results are displayed)  Results for orders placed or performed during the hospital encounter of 16/10/96  Basic metabolic panel  Result Value Ref Range   Sodium 137 135 - 145 mmol/L   Potassium 3.7 3.5 - 5.1 mmol/L   Chloride 106 101 - 111 mmol/L   CO2 23 22 - 32 mmol/L   Glucose, Bld 330 (H) 65 - 99 mg/dL   BUN 20 6 - 20 mg/dL   Creatinine, Ser 1.12 0.61 - 1.24 mg/dL   Calcium 9.5 8.9 - 10.3 mg/dL   GFR calc non Af Amer >60 >60 mL/min   GFR calc Af Amer >60 >60 mL/min   Anion gap 8 5 - 15  CBC  Result Value Ref Range   WBC 3.2 (L) 4.0 - 10.5 K/uL   RBC 4.75 4.22 - 5.81 MIL/uL   Hemoglobin 13.6 13.0 - 17.0 g/dL   HCT 40.9 39.0 - 52.0 %   MCV 86.1 78.0 - 100.0 fL   MCH 28.6 26.0 - 34.0 pg   MCHC 33.3 30.0 - 36.0 g/dL   RDW 13.5 11.5 - 15.5 %   Platelets 139 (L) 150 - 400 K/uL  I-stat troponin, ED  Result Value Ref Range   Troponin i, poc 0.00 0.00 - 0.08 ng/mL   Comment 3          I-Stat Troponin, ED (not at [facility])  Result Value Ref Range   Troponin i, poc 0.00 0.00 - 0.08 ng/mL   Comment 3           Dg Chest 2 View  Result Date: 07/08/2016 CLINICAL DATA:  Chest pain EXAM: CHEST  2 VIEW COMPARISON:  Chest radiograph 06/06/2016 FINDINGS:  The heart size and mediastinal contours are within normal limits. Both lungs are clear. The visualized skeletal structures are unremarkable aside from right shoulder hemiarthroplasty. IMPRESSION: No active cardiopulmonary disease. Electronically Signed   By: Ulyses Jarred M.D.   On: 07/08/2016 22:59   Ct Angio Chest/abd/pel For Dissection W And/or Wo Contrast  Result Date: 07/09/2016 CLINICAL DATA:  Back pain. History of diabetes, myocardial infarct, hypertension, gastroesophageal reflux disease, coronary artery disease, asthma, umbilical hernia repair, hip arthroplasty, cardiac catheterization, and cardiac stent placement. EXAM: CT ANGIOGRAPHY CHEST, ABDOMEN AND PELVIS TECHNIQUE: Multidetector CT imaging through the chest, abdomen and pelvis was performed using the standard protocol during bolus administration of intravenous contrast. Multiplanar reconstructed images and MIPs were obtained and reviewed to evaluate the vascular anatomy. CONTRAST:  100 mL Isovue 370 COMPARISON:  CT chest 06/26/2013 FINDINGS: CTA CHEST FINDINGS Cardiovascular: Noncontrast images of the chest demonstrate coronary artery and aortic calcifications. Coronary artery stents. No intramural hematoma. Images obtained during arterial phase after contrast administration demonstrate normal caliber thoracic aorta. No evidence of aortic aneurysm or dissection. Great vessel origins are patent. Central pulmonary arteries are well opacified. No evidence of significant pulmonary embolus. Normal heart size. No pericardial effusion. Mediastinum/Nodes: No enlarged mediastinal, hilar, or axillary lymph nodes. Thyroid gland, trachea, and esophagus demonstrate no significant findings. Lungs/Pleura: Lungs are clear. No pleural effusion or pneumothorax. Musculoskeletal: Degenerative  changes in the spine. No destructive bone lesions. Review of the MIP images confirms the above findings. CTA ABDOMEN AND PELVIS FINDINGS VASCULAR Aorta: Scattered aortic  calcification. No evidence of aortic aneurysm or dissection. No significant stenosis. Celiac: Patent without evidence of aneurysm, dissection, vasculitis or significant stenosis. SMA: Patent without evidence of aneurysm, dissection, vasculitis or significant stenosis. Renals: 3 right and single left renal arteries are patent. No evidence of significant stenosis or dissection. Renal nephrograms are symmetrical. IMA: Patent without evidence of aneurysm, dissection, vasculitis or significant stenosis. Inflow: Patent without evidence of aneurysm, dissection, vasculitis or significant stenosis. Veins: No obvious venous abnormality within the limitations of this arterial phase study. Review of the MIP images confirms the above findings. NON-VASCULAR Hepatobiliary: No focal liver abnormality is seen. No gallstones, gallbladder wall thickening, or biliary dilatation. Pancreas: Pancreas is mostly atrophic. No inflammatory changes or fluid collections. Spleen: Normal in size without focal abnormality. Adrenals/Urinary Tract: Adrenal glands are unremarkable. Kidneys are normal, without renal calculi, focal lesion, or hydronephrosis. Bladder is unremarkable. Stomach/Bowel: Stomach is within normal limits. Appendix appears normal. No evidence of bowel wall thickening, distention, or inflammatory changes. Scattered colonic diverticula. Lymphatic: No significant lymphadenopathy. Reproductive: Prostate is unremarkable. Other: No abdominal wall hernia or abnormality. No abdominopelvic ascites. Musculoskeletal: Degenerative changes in the spine. Schmorl's node at L5. No destructive bone lesions. Right hip arthroplasty. Review of the MIP images confirms the above findings. IMPRESSION: No evidence of aneurysm or dissection involving the thoracic or abdominal aorta. No evidence of active pulmonary disease. No acute process demonstrated in the abdomen or pelvis. Electronically Signed   By: Lucienne Capers M.D.   On: 07/09/2016 03:41     EKG  EKG Interpretation  Date/Time:  Friday Jul 08 2016 22:18:08 EDT Ventricular Rate:  67 PR Interval:  202 QRS Duration: 96 QT Interval:  388 QTC Calculation: 409 R Axis:   23 Text Interpretation:  Normal sinus rhythm Confirmed by Randal Buba, [place] Minor ([id number]) on 07/09/2016 1:12:09 AM       Procedures Procedures (including critical care time)  Medications Ordered in ED  Medications  nitroGLYCERIN (NITROSTAT) SL tablet 0.4 mg (0.4 mg Sublingual Given 07/09/16 0204)  sodium chloride 0.9 % bolus 500 mL (0 mLs Intravenous Stopped 07/09/16 0328)  gi cocktail (Maalox,Lidocaine,Donnatal) (30 mLs Oral Given 07/09/16 0204)  iopamidol (ISOVUE-370) 76 % injection (100 mLs  Contrast Given 07/09/16 0303)  fentaNYL (SUBLIMAZE) injection 50 mcg (50 mcg Intravenous Given 07/09/16 0356)  ketorolac (TORADOL) 30 MG/ML injection 15 mg (15 mg Intravenous Given 07/09/16 0448)    Case d/w Cardiology, Dr/ Aundra Dubin, with negative EKG and troponins and CT scan this is not cardiac and patient is stable for discharge with close follow up  I have discussed my discussion with cardiology with the patient and his wife and they are reassured.    Final Clinical Impressions(s) / ED Diagnoses  Precordial pain: worse with raising arm.  Ruled out for MI, PNA and dissection in the ED.  I do not believe this is cardiac at this time.  Follow up with your family doctor and cardiologist on Monday.  Strict return precautions given.  Return immediately for exertional pain or shortness of breath sweatiness, intractable pain, weakness, bleeding or any concerns. Follow up with your own doctor for ongoing concerns.    The patient is nontoxic-appearing on exam and vital signs are within normal limits.   I have reviewed the triage vital signs and the nursing notes. Pertinent labs &imaging results that were  available during my care of the patient were reviewed by me and considered in my medical decision making (see chart for  details).  After history, exam, and medical workup I feel the patient has been appropriately medically screened and is safe for discharge home. Pertinent diagnoses were discussed with the patient. Patient was given return precautions.    I personally performed the services described in this documentation, which was scribed in my presence. The recorded information has been reviewed and is accurate.      Raquelle Pietro, MD 07/09/16 2820

## 2016-07-09 NOTE — ED Notes (Signed)
Spouse states that pt needs something for pain; Md notified; Pt himself did not make request for pain med

## 2016-07-12 ENCOUNTER — Telehealth: Payer: Self-pay | Admitting: Nurse Practitioner

## 2016-07-12 NOTE — Telephone Encounter (Signed)
Jennings was here in the office today with his wife for her visit. Informs me of his recent ER visit this past weekend. Having lots of chest burning, going down the arm and into the back. "eating NTG like candy". Went on to the ER - was told "maybe a nerve was twisted when they did your cardiac cath". He was given NTG in the ER and subsequently Fentanyl. His troponins were negative. He was asked to follow up here.    In light of his anatomy, continued symptoms, I am really at a loss of what to do for Hunter Mcbride - I think he needs an opinion and I have arranged a visit with Dr. Burt Knack for next week. I do think this is his angina. Maybe its time to proceed on with CABG. I think our options for medical management have been exhausted - he cannot tolerate Ducre acting nitrates, he has not wished to take higher doses of Ranexa due to expense, etc.   Burtis Junes, RN, [facility] [address] [place], [place]  [phone]

## 2016-07-13 ENCOUNTER — Telehealth (HOSPITAL_COMMUNITY): Payer: Self-pay

## 2016-07-13 NOTE — Telephone Encounter (Signed)
I called and left message on patient voicemail to call office about scheduling for cardiac rehab. I left office contact information on patient voicemail to return call.  ° °

## 2016-07-18 ENCOUNTER — Encounter: Payer: Self-pay | Admitting: Cardiovascular Disease

## 2016-07-18 ENCOUNTER — Ambulatory Visit (INDEPENDENT_AMBULATORY_CARE_PROVIDER_SITE_OTHER): Payer: PPO | Admitting: Cardiovascular Disease

## 2016-07-18 VITALS — BP 140/76 | HR 66 | Ht 69.0 in | Wt 192.0 lb

## 2016-07-18 DIAGNOSIS — I25119 Atherosclerotic heart disease of native coronary artery with unspecified angina pectoris: Secondary | ICD-10-CM | POA: Diagnosis not present

## 2016-07-18 NOTE — Patient Instructions (Signed)
Medication Instructions:  Your physician recommends that you continue on your current medications as directed. Please refer to the Current Medication list given to you today.  Labwork: No new orders.   Testing/Procedures: No new orders.   Follow-Up: Your physician recommends that you keep your scheduled follow-up appointment with Truitt Merle NP in August.   Any Other Special Instructions Will Be Listed Below (If Applicable).     If you need a refill on your cardiac medications before your next appointment, please call your pharmacy.

## 2016-07-18 NOTE — Progress Notes (Signed)
Cardiology Office Note Date:  07/18/2016   ID:  Hunter Stakes., DOB [birth year], MRN [id number]  PCP:  Crist Infante, MD  Cardiologist:  Sherren Mocha, MD    Chief Complaint  Patient presents with  . Follow-up    from ED visit-CP   History of Present Illness: Hunter Mcbride. is a 71 y.o. male who presents for evaluation of chest pain. He's been followed in our practice for several years for CAD with hx of PCI of the LAD in 2015 with overlapping DES. He's had repeated cardiac cath evaluations with FFR of the PDA and first OM branches of the circumflex in 2016 which was negative. He returned with worsening angina in April 2018 and underwent PCI of the distal RCA bifurcation with stenting of the distal RCA into the PLA branch.   Following his last PCI procedure in April 2018, he felt immediate relief from his chest pain. He has chronic atypical angina not generally associated with physical exertion.   The patient is here with his wife today after another ER evaluation for chest pain when he ruled out for MI and had a negative CT angio of the chest. During that visit, he was given a GI cocktail which he says didn't help him at all (except it made his mouth and throat numb). He was treated with Fentanyl and that relieved the chest discomfort.   He describes pain in the back into the chest which feels like pressure and stabbing pains intermittently. He had a few more pains during that same 24 hour period but none since then. He's been physically active without problems over the past week. States he feels better when he's active.   Past Medical History:  Diagnosis Date  . Arthritis    "lower back; right shoulder; left thumb; joints" (06/21/2013)  . Asthma    "not sure if this is true or not" (06/21/2013)  . CAD (coronary artery disease) May 2015   s/p PTCA/DES x 3 to mid LAD, mod non-ob disease in Cx and RCA May 2015; s/p repeat caths - last study in November of 2015 - stents patent, - FFR -  managed medically.   [name] GERD (gastroesophageal reflux disease)   . Gout    "maybe twice in my life"  . Headache(784.0)    "weekly for the last 3-4 months" (06/21/2013)  . Hyperlipidemia   . Hypertension   . Myocardial infarction [facility])    "/Dr. Katharina Caper I've had one between 2014-2015) (06/21/2013)  . Osteoarthritis   . Sleep apnea    WEIGHT LOSS, NO LONGER NEEDS PER PATIENT  . Type II diabetes mellitus ([place])    TYPE 2    Past Surgical History:  Procedure Laterality Date  . CARDIAC CATHETERIZATION  1980's   "once"  . CARDIAC CATHETERIZATION N/A 07/23/2015   Procedure: Left Heart Cath and Coronary Angiography;  Surgeon: Sherren Mocha, MD;  Location: [place] [facility];  Service: Cardiovascular;  Laterality: N/A;  . CARDIOVASCULAR STRESS TEST  06/10/2008   EF 68%  . CARPAL TUNNEL RELEASE Bilateral   . CORONARY ANGIOPLASTY WITH STENT PLACEMENT  06/21/2013   "3"  . CORONARY STENT INTERVENTION N/A 06/07/2016   Procedure: Coronary Stent Intervention;  Surgeon: Burnell Blanks, MD;  Location: [place] [facility];  Service: Cardiovascular;  Laterality: N/A;  . EXCISION MORTON'S NEUROMA Left   . EYE SURGERY Left    "removed film over my eye"  . FRACTIONAL FLOW RESERVE WIRE N/A 06/26/2013  Procedure: FRACTIONAL FLOW RESERVE WIRE;  Surgeon: [name], MD;  Location: [facility] CATH LAB;  Service: Cardiovascular;  Laterality: N/A;  . HERNIA REPAIR Left   . JOINT REPLACEMENT Left 03/2013   "thumb"  . LEFT HEART CATH AND CORONARY ANGIOGRAPHY N/A 06/07/2016   Procedure: Left Heart Cath and Coronary Angiography;  Surgeon: Burnell Blanks, MD;  Location: [place] [facility];  Service: Cardiovascular;  Laterality: N/A;  . LEFT HEART CATHETERIZATION WITH CORONARY ANGIOGRAM N/A 06/21/2013   Procedure: LEFT HEART CATHETERIZATION WITH CORONARY ANGIOGRAM;  Surgeon: Burnell Blanks, MD;  Location: [facility] CATH LAB;  Service: Cardiovascular;  Laterality: N/A;  . LEFT HEART CATHETERIZATION WITH CORONARY  ANGIOGRAM N/A 06/26/2013   Procedure: LEFT HEART CATHETERIZATION WITH CORONARY ANGIOGRAM;  Surgeon: [name], MD;  Location: [facility] CATH LAB;  Service: Cardiovascular;  Laterality: N/A;  . TONSILLECTOMY    . TOTAL HIP ARTHROPLASTY Right 04/12/2016   Procedure: RIGHT TOTAL HIP ARTHROPLASTY ANTERIOR APPROACH;  Surgeon: Paralee Cancel, MD;  Location: [facility];  Service: Orthopedics;  Laterality: Right;  requests 70 mins  . TOTAL SHOULDER ARTHROPLASTY  03/01/2012   Procedure: TOTAL SHOULDER ARTHROPLASTY;  Surgeon: Marin Shutter, MD;  Location: [place];  Service: Orthopedics;  Laterality: Right;  . UMBILICAL HERNIA REPAIR      Current Outpatient Prescriptions  Medication Sig Dispense Refill  . acetaminophen (TYLENOL) 500 MG tablet Take 500 mg by mouth every 8 (eight) hours as needed (pain).    [name] ALPRAZolam (XANAX) 0.5 MG tablet Take 0.5 mg by mouth daily as needed for anxiety.     [name] amLODipine (NORVASC) 5 MG tablet Take 1 tablet (5 mg total) by mouth 2 (two) times daily. 180 tablet 3  . aspirin 81 MG tablet Take 81 mg by mouth at bedtime.     [name] atenolol (TENORMIN) 50 MG tablet Take 1 tablet (50 mg total) by mouth at bedtime. 90 tablet 3  . atorvastatin (LIPITOR) 80 MG tablet Take 40 mg by mouth daily at 6 PM.     . canagliflozin (INVOKANA) 300 MG TABS tablet Take 300 mg by mouth daily before breakfast.    . Cholecalciferol (VITAMIN D PO) Take 1 capsule by mouth daily.    . clopidogrel (PLAVIX) 75 MG tablet Take 1 tablet (75 mg total) by mouth daily with breakfast. 90 tablet 3  . docusate sodium (COLACE) 100 MG capsule Take 100 mg by mouth daily as needed for mild constipation.    . gabapentin (NEURONTIN) 300 MG capsule Take 300 mg by mouth daily.    . hydrochlorothiazide (MICROZIDE) 12.5 MG capsule Take 1 capsule (12.5 mg total) by mouth every Monday, Wednesday, and Friday. 36 capsule 3  . irbesartan (AVAPRO) 300 MG tablet Take 0.5 tablets (150 mg total) by mouth daily. 90 tablet 3  . isosorbide  mononitrate (IMDUR) 30 MG 24 hr tablet TAKE 1 TABLET BY MOUTH TWICE (2) DAILY 180 tablet 2  . metFORMIN (GLUCOPHAGE-XR) 500 MG 24 hr tablet Take 2 tablets (1,000 mg total) by mouth 2 (two) times daily.    . nitroGLYCERIN (NITROSTAT) 0.4 MG SL tablet Place 1 tablet (0.4 mg total) under the tongue every 5 (five) minutes as needed. For chest pain 25 tablet 5  . pantoprazole (PROTONIX) 40 MG tablet Take 40 mg by mouth 2 (two) times daily.    . polyethylene glycol (MIRALAX / GLYCOLAX) packet Take 17 g by mouth daily as needed for mild constipation.    . ranolazine (RANEXA)  500 MG 12 hr tablet Take 1 tablet (500 mg total) by mouth 2 (two) times daily. 180 tablet 3  . traMADol (ULTRAM) 50 MG tablet Take 50-100 mg by mouth every 6 (six) hours as needed (pain).     No current facility-administered medications for this visit.     Allergies:   Patient has no known allergies.   Social History:  The patient  reports that he has never smoked. He has never used smokeless tobacco. He reports that he does not drink alcohol or use drugs.   Family History:  The patient's  family history includes Coronary artery disease in his cousin; Diabetes type II in his mother; Heart attack in his father; Heart disease in his brother.    ROS:  Please see the history of present illness.  All other systems are reviewed and negative.    PHYSICAL EXAM: VS:  BP 140/76   Pulse 66   Ht 5\' 9"  (1.753 m)   Wt 192 lb (87.1 kg)   BMI 28.35 kg/m  , BMI Body mass index is 28.35 kg/m. GEN: Well nourished, well developed, in no acute distress  HEENT: normal  Neck: no JVD, no masses. No carotid bruits Cardiac: RRR without murmur or gallop                Respiratory:  clear to auscultation bilaterally, normal work of breathing GI: soft, nontender, nondistended, + BS MS: no deformity or atrophy  Ext: no pretibial edema, pedal pulses 2+= bilaterally Skin: warm and dry, no rash Neuro:  Strength and sensation are intact Psych:  euthymic mood, full affect  EKG:  EKG is not ordered today.  Recent Labs: 06/06/2016: ALT 15; B Natriuretic Peptide 54.3; Magnesium 2.0; TSH 2.009 07/08/2016: BUN 20; Creatinine, Ser 1.12; Hemoglobin 13.6; Platelets 139; Potassium 3.7; Sodium 137   Lipid Panel     Component Value Date/Time   CHOL 87 06/07/2016 0425   TRIG 74 06/07/2016 0425   HDL 35 (L) 06/07/2016 0425   CHOLHDL 2.5 06/07/2016 0425   VLDL 15 06/07/2016 0425   LDLCALC 37 06/07/2016 0425      Wt Readings from Last 3 Encounters:  07/18/16 192 lb (87.1 kg)  07/08/16 189 lb (85.7 kg)  06/15/16 191 lb 12.8 oz (87 kg)     Cardiac Studies Reviewed: GXT June 2017: Study Highlights   Patient presents today for routine GXT. Seen for Dr. Burt Knack. Has had recent cardiac cath - GXT to rule out ischemia. He has done well post cath. No further angina. On higher dose of Norvasc. Reports better BP control.   Resting BP is 150/84 Target HR is 129  Today the patient exercised on the standard Bruce protocol for a total of 11 minutes.  Excellent exercise tolerance.  Mildly hypertensive blood pressure response (atenolol held today).   Max HR is 129 Max BP is 238/90  Clinically negative for chest pain. Test was stopped due to patient request and achievement of target HR.  EKG negative for ischemia. No significant arrhythmia noted. Rare PVC noted  Recommendations: Reviewed with Dr. Burt Knack. Will continue with current medical management which includes the higher dose of Norvasc See back in 3 months.    ASSESSMENT AND PLAN: CAD, native vessel, with angina: The patient has chronic atypical angina which has been very difficult to manage over time. He's had multiple cath procedures most recently with a PCI performed with at least some immediate relief of his symptoms. He's had a recurrent episode  of chest pain and I have reviewed the emergency room records when he ruled out for MI and had no EKG changes. The patient is on an  excellent medical regimen and really does not want to increase his antianginal therapy any further. He takes isosorbide, amlodipine, atenolol, and ranolazine. He remains on dual antiplatelet therapy with aspirin and clopidogrel.  I have reviewed his most recent cardiac catheterization and PCI films. They have reviewed his last stress test from one year ago when he was able to exercise 11 minutes according to the Bruce protocol. I discussed treatment options at length with the patient and his wife, including considerations of continued medical therapy, repeat angiography/PCI, and consideration of CABG. In my opinion he does not appear to have enough high-grade large vessel epicardial disease to make CABG a very attractive option. However, if he has another significant episode of chest pain, I would be inclined to repeat his cardiac catheterization and perform pressure wire analysis of all major coronary vessels in order to make a definitive recommendation about whether to consider CABG, PCI, or continued medical therapy. The patient has significant disease in the diagonal branches which are small and diffusely diseased, unlikely to be treatable by PCI or bypass grafting. For now he will continue on his current medicines without change. I also counseled him extensively about the fact that he seems to be physically overdoing it. While he doesn't have symptoms during physical exertion, he becomes exhausted afterwards and oftentimes has chest discomfort after he has done hard work. I asked him to reduce his physical work to a more modest workload.   Current medicines are reviewed with the patient today.  The patient does not have concerns regarding medicines.  Labs/ tests ordered today include:  No orders of the defined types were placed in this encounter.  Disposition:   FU as planned with Truitt Merle, NP  Signed, Sherren Mocha, MD  07/18/2016 12:41 PM    [address], [place], [place]  [address] Phone: [phone]; Fax: [phone]

## 2016-08-04 ENCOUNTER — Encounter (HOSPITAL_COMMUNITY): Payer: Self-pay

## 2016-08-04 NOTE — Progress Notes (Signed)
*  Final notice* I have mailed patient a letter with information about cardiac rehab. Patient has been called 2X. If patient does not respond to letter, referral will be canceled.

## 2016-08-27 IMAGING — CR DG ANKLE COMPLETE 3+V*L*
3 series · 3 of 3 positions shown · non-contrast
Comparison: None.

CLINICAL DATA: Generalized left foot and ankle pain for 5 weeks.
Injury 1 week ago with bruising across the forefoot.

EXAM:
LEFT ANKLE COMPLETE - 3+ VIEW

[t ankle joint lat left]
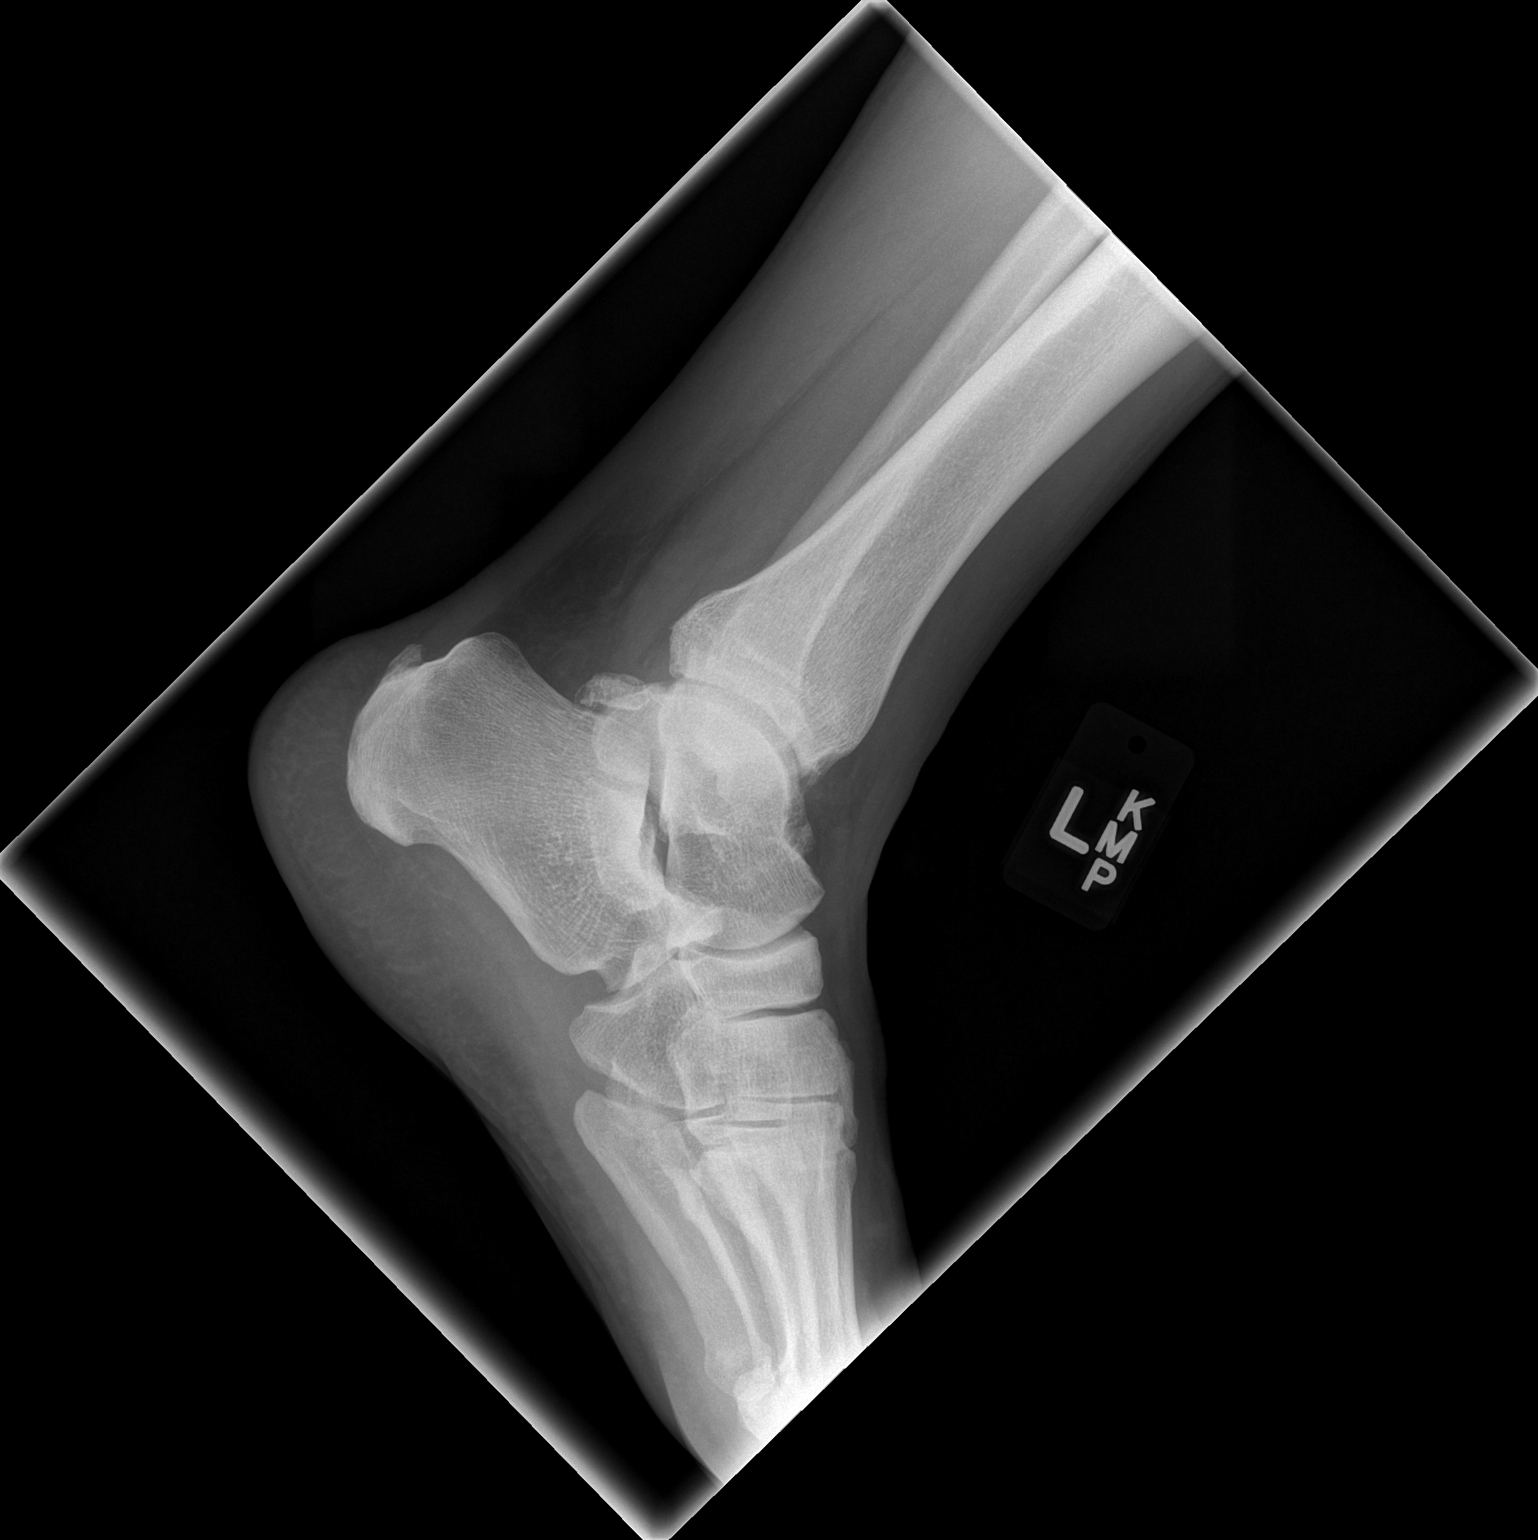

[t ankle joint ap left]
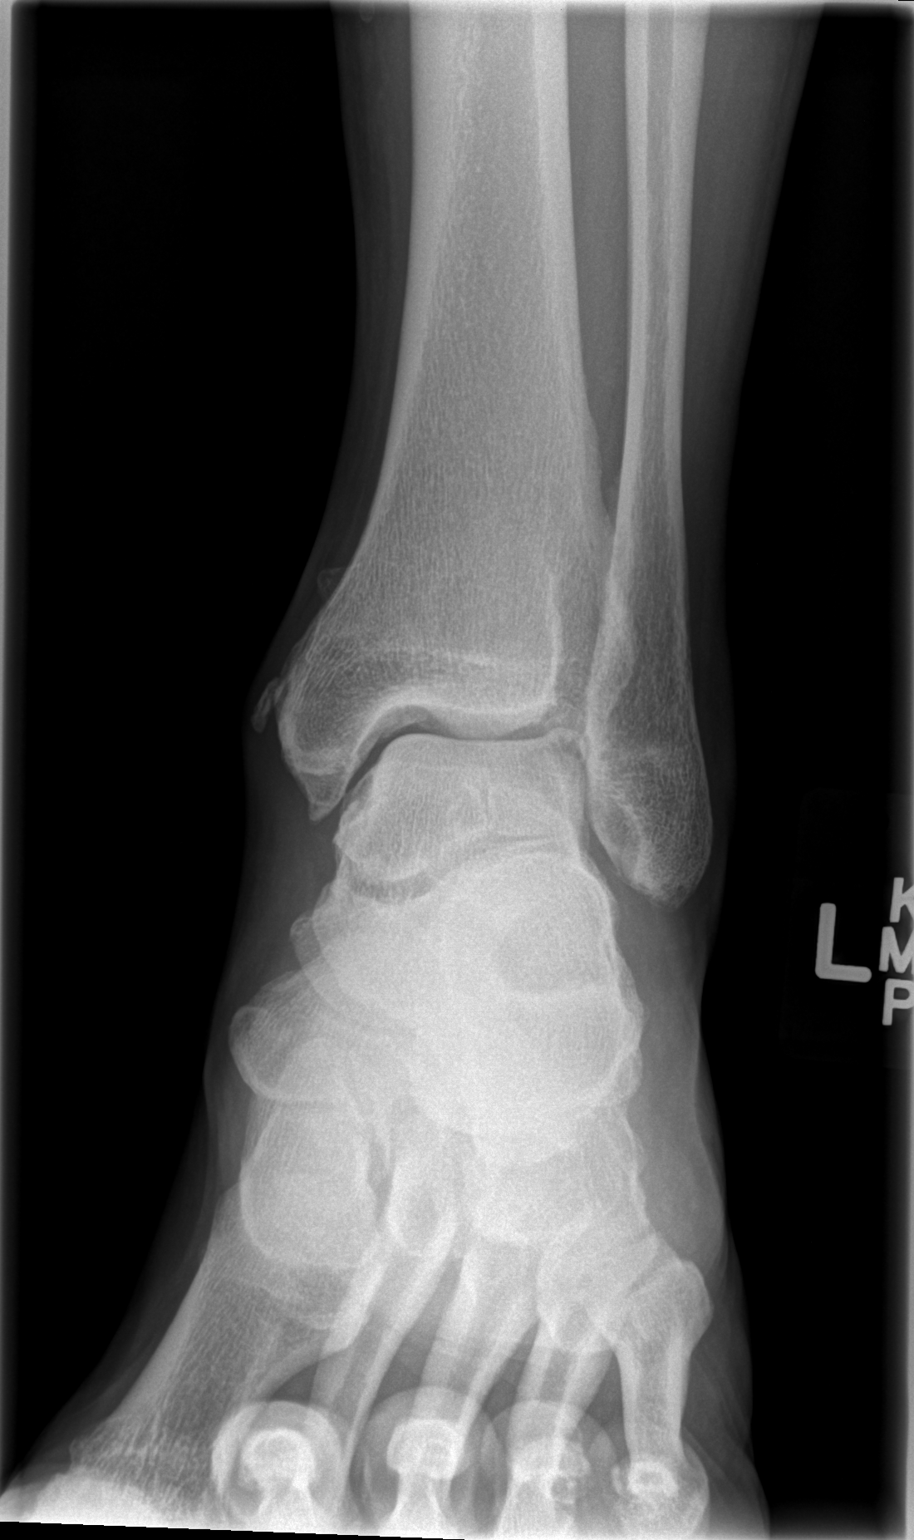

[t ankle joint oblique left]
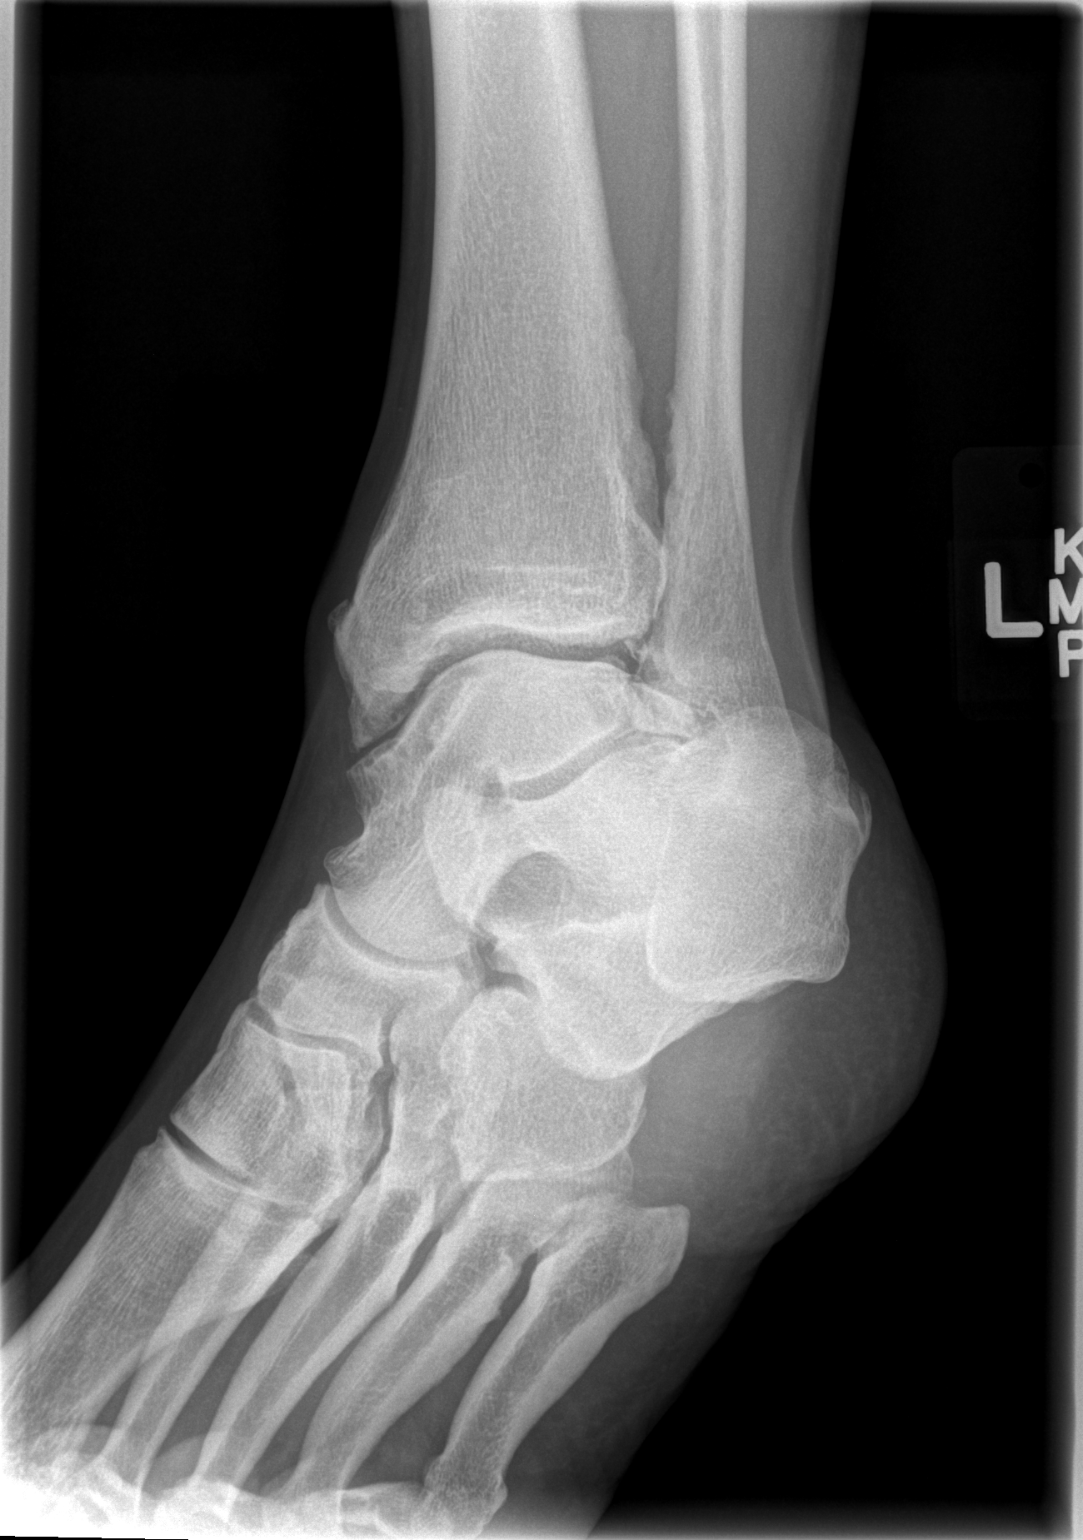

[3 of 3 positions shown; findings below may reference images not displayed]

FINDINGS: The mineralization and alignment are normal. There is no evidence of
acute fracture or dislocation at the ankle. There is mild spurring
of the medial malleolus and along the distal interosseous membrane.
There is no widening of the ankle mortise. Mild tibiotalar
degenerative changes are present. There is a small posterior
calcaneal spur.
IMPRESSION: No acute osseous findings at the ankle.

## 2016-08-29 DIAGNOSIS — Z471 Aftercare following joint replacement surgery: Secondary | ICD-10-CM | POA: Diagnosis not present

## 2016-08-29 DIAGNOSIS — M25551 Pain in right hip: Secondary | ICD-10-CM | POA: Diagnosis not present

## 2016-08-29 DIAGNOSIS — Z96641 Presence of right artificial hip joint: Secondary | ICD-10-CM | POA: Diagnosis not present

## 2016-08-29 DIAGNOSIS — M5136 Other intervertebral disc degeneration, lumbar region: Secondary | ICD-10-CM | POA: Diagnosis not present

## 2016-08-29 DIAGNOSIS — M7061 Trochanteric bursitis, right hip: Secondary | ICD-10-CM | POA: Diagnosis not present

## 2016-09-03 DIAGNOSIS — M5136 Other intervertebral disc degeneration, lumbar region: Secondary | ICD-10-CM | POA: Diagnosis not present

## 2016-09-12 DIAGNOSIS — M25551 Pain in right hip: Secondary | ICD-10-CM | POA: Diagnosis not present

## 2016-09-14 DIAGNOSIS — I2510 Atherosclerotic heart disease of native coronary artery without angina pectoris: Secondary | ICD-10-CM | POA: Diagnosis not present

## 2016-09-14 DIAGNOSIS — Z96641 Presence of right artificial hip joint: Secondary | ICD-10-CM | POA: Diagnosis not present

## 2016-09-14 DIAGNOSIS — M48061 Spinal stenosis, lumbar region without neurogenic claudication: Secondary | ICD-10-CM | POA: Diagnosis not present

## 2016-09-14 DIAGNOSIS — Z6828 Body mass index (BMI) 28.0-28.9, adult: Secondary | ICD-10-CM | POA: Diagnosis not present

## 2016-09-14 DIAGNOSIS — E119 Type 2 diabetes mellitus without complications: Secondary | ICD-10-CM | POA: Diagnosis not present

## 2016-09-14 DIAGNOSIS — Z471 Aftercare following joint replacement surgery: Secondary | ICD-10-CM | POA: Diagnosis not present

## 2016-09-14 DIAGNOSIS — M5410 Radiculopathy, site unspecified: Secondary | ICD-10-CM | POA: Diagnosis not present

## 2016-09-14 DIAGNOSIS — E784 Other hyperlipidemia: Secondary | ICD-10-CM | POA: Diagnosis not present

## 2016-09-14 DIAGNOSIS — M5136 Other intervertebral disc degeneration, lumbar region: Secondary | ICD-10-CM | POA: Diagnosis not present

## 2016-09-14 DIAGNOSIS — I10 Essential (primary) hypertension: Secondary | ICD-10-CM | POA: Diagnosis not present

## 2016-09-28 ENCOUNTER — Ambulatory Visit (INDEPENDENT_AMBULATORY_CARE_PROVIDER_SITE_OTHER): Payer: PPO | Admitting: Nurse Practitioner

## 2016-09-28 ENCOUNTER — Encounter: Payer: Self-pay | Admitting: Nurse Practitioner

## 2016-09-28 VITALS — BP 132/80 | HR 59 | Ht 69.0 in | Wt 189.8 lb

## 2016-09-28 DIAGNOSIS — I10 Essential (primary) hypertension: Secondary | ICD-10-CM | POA: Diagnosis not present

## 2016-09-28 DIAGNOSIS — I259 Chronic ischemic heart disease, unspecified: Secondary | ICD-10-CM | POA: Diagnosis not present

## 2016-09-28 DIAGNOSIS — I2510 Atherosclerotic heart disease of native coronary artery without angina pectoris: Secondary | ICD-10-CM

## 2016-09-28 DIAGNOSIS — E7800 Pure hypercholesterolemia, unspecified: Secondary | ICD-10-CM

## 2016-09-28 NOTE — Patient Instructions (Addendum)
We will be checking the following labs today - NONE   Medication Instructions:    Continue with your current medicines.     Testing/Procedures To Be Arranged:  N/A  Follow-Up:   See me in 3 to 4 months.     Other Special Instructions:   Use your NTG as needed.     If you need a refill on your cardiac medications before your next appointment, please call your pharmacy.   Call the [place] office at [phone] if you have any questions, problems or concerns.

## 2016-09-28 NOTE — Progress Notes (Signed)
CARDIOLOGY OFFICE NOTE  Date:  09/28/2016    Hunter Mcbride. Date of Birth: [birth year] Medical Record #[id number]  PCP:  Hunter Infante, MD  Cardiologist:  Hunter Mcbride & Hunter Mcbride  Chief Complaint  Patient presents with  . Coronary Artery Disease    Follow up visit - seen for Dr. Acie Mcbride & Hunter Mcbride    History of Present Illness: Hunter Mcbride. is a 71 y.o. male who presents today for a follow up visit. Seen for Dr. Acie Mcbride & Hunter Mcbride.   He has a history of CAD, HTN, HLD, DM and OSA. He has had prior DES x 3 to the LAD back in 2015. Several caths since. Intolerant to higher doses of nitrates despite multiple attempts. Has not wanted to increase Ranexa due to cost. Has had chronic tendency to really overexert himself.  It is surprising how very well his exercise tolerance is and that his angina is typically always at rest - this is chronic. He has had prior echo showing mild dilatation of the aorta. His last cath was back in April after having prolonged episodes of chest pain - had angioplasty at that time to a severe stenosis in the proximal PDA that was small to moderate caliber. Had DES x 1 to the proximal posterolateral artery.   I last saw him in May - still with some intermittent chest pain - had him seen by Dr. Burt Mcbride -  who "discussed treatment options at length with the patient and his wife, including considerations of continued medical therapy, repeat angiography/PCI, and consideration of CABG. In my opinion he does not appear to have enough high-grade large vessel epicardial disease to make CABG a very attractive option. However, if he has another significant episode of chest pain, I would be inclined to repeat his cardiac catheterization and perform pressure wire analysis of all major coronary vessels in order to make a definitive recommendation about whether to consider CABG, PCI, or continued medical therapy".   Comes in today. Here with his wife Hunter Mcbride. Lots of issues  again. Says he is doing "alright". He has had a "few pains". Some upper back burning - that he associates with his angina. Woke up last week one morning with anterior chest pain - took NTG just once. Says he is "trying to just forget it". Says "it burns in there right much" in the back.  Tells me he may be having more hip surgery - the right one - which was replaced earlier this year. Cannot sit on the right buttock. Sitting really aggravates him. Has seen ortho and going back next month. It seems that his hip issue is more pressing than his recurrent angina. He did not seem to understand that he is committed to DAPT for the next year ideally. Would remain at increased risk from our standpoint.   Past Medical History:  Diagnosis Date  . Arthritis    "lower back; right shoulder; left thumb; joints" (06/21/2013)  . Asthma    "not sure if this is true or not" (06/21/2013)  . CAD (coronary artery disease) May 2015   s/p PTCA/DES x 3 to mid LAD, mod non-ob disease in Cx and RCA May 2015; s/p repeat caths - last study in November of 2015 - stents patent, - FFR - managed medically.   [name] GERD (gastroesophageal reflux disease)   . Gout    "maybe twice in my life"  . Headache(784.0)    "weekly for the last 3-4 months" (  06/21/2013)  . Hyperlipidemia   . Hypertension   . Myocardial infarction [facility])    "/Dr. Katharina Caper I've had one between 2014-2015) (06/21/2013)  . Osteoarthritis   . Sleep apnea    WEIGHT LOSS, NO LONGER NEEDS PER PATIENT  . Type II diabetes mellitus ([place])    TYPE 2    Past Surgical History:  Procedure Laterality Date  . CARDIAC CATHETERIZATION  1980's   "once"  . CARDIAC CATHETERIZATION N/A 07/23/2015   Procedure: Left Heart Cath and Coronary Angiography;  Surgeon: Sherren Mocha, MD;  Location: [facility];  Service: Cardiovascular;  Laterality: N/A;  . CARDIOVASCULAR STRESS TEST  06/10/2008   EF 68%  . CARPAL TUNNEL RELEASE Bilateral   . CORONARY ANGIOPLASTY WITH STENT PLACEMENT   06/21/2013   "3"  . CORONARY STENT INTERVENTION N/A 06/07/2016   Procedure: Coronary Stent Intervention;  Surgeon: Burnell Blanks, MD;  Location: [facility];  Service: Cardiovascular;  Laterality: N/A;  . EXCISION MORTON'S NEUROMA Left   . EYE SURGERY Left    "removed film over my eye"  . FRACTIONAL FLOW RESERVE WIRE N/A 06/26/2013   Procedure: FRACTIONAL FLOW RESERVE WIRE;  Surgeon: [name], MD;  Location: [facility];  Service: Cardiovascular;  Laterality: N/A;  . HERNIA REPAIR Left   . JOINT REPLACEMENT Left 03/2013   "thumb"  . LEFT HEART CATH AND CORONARY ANGIOGRAPHY N/A 06/07/2016   Procedure: Left Heart Cath and Coronary Angiography;  Surgeon: Burnell Blanks, MD;  Location: [facility];  Service: Cardiovascular;  Laterality: N/A;  . LEFT HEART CATHETERIZATION WITH CORONARY ANGIOGRAM N/A 06/21/2013   Procedure: LEFT HEART CATHETERIZATION WITH CORONARY ANGIOGRAM;  Surgeon: Burnell Blanks, MD;  Location: [facility];  Service: Cardiovascular;  Laterality: N/A;  . LEFT HEART CATHETERIZATION WITH CORONARY ANGIOGRAM N/A 06/26/2013   Procedure: LEFT HEART CATHETERIZATION WITH CORONARY ANGIOGRAM;  Surgeon: [name], MD;  Location: [facility];  Service: Cardiovascular;  Laterality: N/A;  . TONSILLECTOMY    . TOTAL HIP ARTHROPLASTY Right 04/12/2016   Procedure: RIGHT TOTAL HIP ARTHROPLASTY ANTERIOR APPROACH;  Surgeon: Paralee Cancel, MD;  Location: [facility];  Service: Orthopedics;  Laterality: Right;  requests 70 mins  . TOTAL SHOULDER ARTHROPLASTY  03/01/2012   Procedure: TOTAL SHOULDER ARTHROPLASTY;  Surgeon: Marin Shutter, MD;  Location: [facility];  Service: Orthopedics;  Laterality: Right;  . UMBILICAL HERNIA REPAIR       Medications: No outpatient prescriptions have been marked as taking for the 09/28/16 encounter (Office Visit) with Burtis Junes, NP.     Allergies: No Known Allergies  Social History: The patient  reports that he has  never smoked. He has never used smokeless tobacco. He reports that he does not drink alcohol or use drugs.   Family History: The patient's family history includes Coronary artery disease in his cousin; Diabetes type II in his mother; Heart attack in his father; Heart disease in his brother.   Review of Systems: Please see the history of present illness.   Otherwise, the review of systems is positive for none.   All other systems are reviewed and negative.   Physical Exam: VS:  BP 132/80 (BP Location: Left Arm, Patient Position: Sitting, Cuff Size: Normal)   Pulse (!) 59   Ht 5\' 9"  (1.753 m)   Wt 189 lb 12.8 oz (86.1 kg)   BMI 28.03 kg/m  .  BMI Body mass index is 28.03 kg/m.  Wt Readings from Last 3 Encounters:  09/28/16 189 lb 12.8 oz (86.1 kg)  07/18/16 192 lb (87.1 kg)  07/08/16 189 lb (85.7 kg)    General: Pleasant. Quite tanned. Very talkative. He is alert and in no acute distress.   HEENT: Normal.  Neck: Supple, no JVD, carotid bruits, or masses noted.  Cardiac: Regular rate and rhythm. No murmurs, rubs, or gallops. No edema.  Respiratory:  Lungs are clear to auscultation bilaterally with normal work of breathing.  GI: Soft and nontender.  MS: No deformity or atrophy. Gait and ROM intact.  Skin: Warm and dry. Color is normal.  Neuro:  Strength and sensation are intact and no gross focal deficits noted.  Psych: Alert, appropriate and with normal affect.   LABORATORY DATA:  EKG:  EKG is ordered today. This shows NSR.   Lab Results  Component Value Date   WBC 3.2 (L) 07/08/2016   HGB 13.6 07/08/2016   HCT 40.9 07/08/2016   PLT 139 (L) 07/08/2016   GLUCOSE 330 (H) 07/08/2016   CHOL 87 06/07/2016   TRIG 74 06/07/2016   HDL 35 (L) 06/07/2016   LDLCALC 37 06/07/2016   ALT 15 (L) 06/06/2016   AST 30 06/06/2016   NA 137 07/08/2016   K 3.7 07/08/2016   CL 106 07/08/2016   CREATININE 1.12 07/08/2016   BUN 20 07/08/2016   CO2 23 07/08/2016   TSH 2.009 06/06/2016    INR 0.98 06/06/2016   HGBA1C 7.1 (H) 06/06/2016     BNP (last 3 results)  Recent Labs  06/06/16 1818  BNP 54.3    ProBNP (last 3 results) No results for input(s): PROBNP in the last 8760 hours.   Other Studies Reviewed Today:  Procedures   Coronary Stent Intervention 05/2016  Left Heart Cath and Coronary Angiography  Conclusion     RPDA lesion, 75 %stenosed.  Ost LM lesion, 40 %stenosed.  1st Mrg lesion, 60 %stenosed.  Prox LAD to Dist LAD lesion, 10 %stenosed.  Dist LAD lesion, 40 %stenosed.  2nd Diag lesion, 80 %stenosed.  1st Diag lesion, 80 %stenosed.  Dist RCA lesion, 90 %stenosed.  Ost RPDA to RPDA lesion, 95 %stenosed.  Mid RCA lesion, 40 %stenosed.  Prox RCA to Mid RCA lesion, 40 %stenosed.  Mid Cx lesion, 60 %stenosed.  The left ventricular systolic function is normal.  LV end diastolic pressure is normal.  The left ventricular ejection fraction is 55-65% by visual estimate.  There is no mitral valve regurgitation.  1. Triple vessel CAD 2. Severe stenosis ostium of the moderate caliber posterolateral branch. Successful PTCA/DES x 1 proximal posterolateral artery.  3. Severe stenosis small to moderate caliber PDA. Successful PTCA with balloon angioplasty only proximal PDA.  4. Patent stent mid LAD.  5. Diffuse disease in the small caliber diagonal branches.  6. Normal LV systolic function 7. Mild left main stenosis.   Recommendations: Continue DAPT for at least one year. Continue statin, beta blocker, Norvasc and Ranexa.       Assessment/Plan:  1. CAD with history of chronic angina - prior DES x 3 to the LAD, he has had PCI with DES x 1 to the proximal PLA and balloon PCI to the proximal PDA - last cath in April of this year. Committed to DAPT for at least 6 months - preferably a year. Needs to use NTG - advised to "let it be your friend". Right now, his hip seems to be more of the issue that his  heart. He understands Dr.  Antionette Char recommendation - I have elected to continue to follow for now.    2. HTN - BP ok on current regimen  3. HLD - on statin - labs typically followed by PCP. His most recent lipid panel shows LDL of 37. Don't really see a role for PCSK9 therapy at this time but I told him that if angina/recurrent caths become a more frequent occurrence - then this needs to be considered - again - he is worried about the cost.   4. DM - followed by Dr. Joylene Draft.  5. Previously noted mildly dilated aortic root/ascending aorta from outside institution - not seen on our last echo from May 2015 - would follow for now. Continue with BP control.   6. Right hip pain - seeing ortho again next month - explained that he is committed to DAPT - ideally thru next April without interruption - he would be at increased risk regardless.    Current medicines are reviewed with the patient today.  The patient does not have concerns regarding medicines other than what has been noted above.  The following changes have been made:  See above.  Labs/ tests ordered today include:   No orders of the defined types were placed in this encounter.    Disposition:   FU with me in 4 months.   Patient is agreeable to this plan and will call if any problems develop in the interim.   SignedTruitt Merle, NP  09/28/2016 8:59 AM  [facility] [address] [place], [place]  [address] Phone: [phone] Fax: [phone]

## 2016-10-07 DIAGNOSIS — M25512 Pain in left shoulder: Secondary | ICD-10-CM | POA: Diagnosis not present

## 2016-10-07 DIAGNOSIS — G8929 Other chronic pain: Secondary | ICD-10-CM | POA: Diagnosis not present

## 2016-10-19 DIAGNOSIS — Z96641 Presence of right artificial hip joint: Secondary | ICD-10-CM | POA: Diagnosis not present

## 2016-10-19 DIAGNOSIS — M48061 Spinal stenosis, lumbar region without neurogenic claudication: Secondary | ICD-10-CM | POA: Diagnosis not present

## 2016-10-19 DIAGNOSIS — Z471 Aftercare following joint replacement surgery: Secondary | ICD-10-CM | POA: Diagnosis not present

## 2016-10-26 ENCOUNTER — Telehealth: Payer: Self-pay | Admitting: Nurse Practitioner

## 2016-10-26 NOTE — Telephone Encounter (Signed)
Spoke with patient and made him aware that we do not have any samples available at this time. I asked him if he would be interested in applying for patient assistance and he states that he has already applied and he did not qualify. He asked if I could give him the number to contact the drug rep and I informed him that I could not give out that information. He then asked me if I would have her contact him and I told him that they would not contact patients. I informed him that per the rep, the medication is going generic soon and as a result we are receiving less samples from the company.  I made him aware that if we do get samples in, we would not be able to supply him with enough for the remainder of the year. He said that he is in the donut hole and it will cost him $900 to refill it. He stated that he could only afford to refill it one more time this year and if he cannot get samples then he just won't take the medication. He will call back later in the month to see if we have gotten any samples in. Patient appreciative of my call.

## 2016-10-26 NOTE — Telephone Encounter (Signed)
New message     Patient calling the office for samples of medication:   1.  What medication and dosage are you requesting samples for?  ranolazine (RANEXA) 500 MG 12 hr tablet Take 1 tablet (500 mg total) by mouth 2 (two) times daily.     2.  Are you currently out of this medication?  A week left

## 2016-10-27 ENCOUNTER — Observation Stay (HOSPITAL_COMMUNITY)
Admission: EM | Admit: 2016-10-27 | Discharge: 2016-10-29 | Disposition: A | Discharge status: Home / Self Care | Payer: PPO | Attending: Cardiology | Admitting: Cardiology

## 2016-10-27 ENCOUNTER — Emergency Department (HOSPITAL_COMMUNITY): Payer: PPO

## 2016-10-27 ENCOUNTER — Encounter (HOSPITAL_COMMUNITY): Payer: Self-pay

## 2016-10-27 DIAGNOSIS — Z7902 Long term (current) use of antithrombotics/antiplatelets: Secondary | ICD-10-CM | POA: Insufficient documentation

## 2016-10-27 DIAGNOSIS — R079 Chest pain, unspecified: Secondary | ICD-10-CM | POA: Diagnosis present

## 2016-10-27 DIAGNOSIS — G4733 Obstructive sleep apnea (adult) (pediatric): Secondary | ICD-10-CM | POA: Insufficient documentation

## 2016-10-27 DIAGNOSIS — E119 Type 2 diabetes mellitus without complications: Secondary | ICD-10-CM | POA: Diagnosis not present

## 2016-10-27 DIAGNOSIS — I25110 Atherosclerotic heart disease of native coronary artery with unstable angina pectoris: Secondary | ICD-10-CM | POA: Diagnosis not present

## 2016-10-27 DIAGNOSIS — Z79899 Other long term (current) drug therapy: Secondary | ICD-10-CM | POA: Insufficient documentation

## 2016-10-27 DIAGNOSIS — M1990 Unspecified osteoarthritis, unspecified site: Secondary | ICD-10-CM | POA: Insufficient documentation

## 2016-10-27 DIAGNOSIS — Z8249 Family history of ischemic heart disease and other diseases of the circulatory system: Secondary | ICD-10-CM | POA: Diagnosis not present

## 2016-10-27 DIAGNOSIS — Z96641 Presence of right artificial hip joint: Secondary | ICD-10-CM | POA: Diagnosis not present

## 2016-10-27 DIAGNOSIS — K219 Gastro-esophageal reflux disease without esophagitis: Secondary | ICD-10-CM | POA: Diagnosis not present

## 2016-10-27 DIAGNOSIS — J45909 Unspecified asthma, uncomplicated: Secondary | ICD-10-CM | POA: Insufficient documentation

## 2016-10-27 DIAGNOSIS — Z955 Presence of coronary angioplasty implant and graft: Secondary | ICD-10-CM | POA: Insufficient documentation

## 2016-10-27 DIAGNOSIS — I2510 Atherosclerotic heart disease of native coronary artery without angina pectoris: Secondary | ICD-10-CM | POA: Diagnosis present

## 2016-10-27 DIAGNOSIS — I10 Essential (primary) hypertension: Secondary | ICD-10-CM | POA: Diagnosis not present

## 2016-10-27 DIAGNOSIS — I252 Old myocardial infarction: Secondary | ICD-10-CM | POA: Diagnosis not present

## 2016-10-27 DIAGNOSIS — I200 Unstable angina: Secondary | ICD-10-CM | POA: Diagnosis not present

## 2016-10-27 DIAGNOSIS — E785 Hyperlipidemia, unspecified: Secondary | ICD-10-CM | POA: Diagnosis present

## 2016-10-27 DIAGNOSIS — E7800 Pure hypercholesterolemia, unspecified: Secondary | ICD-10-CM | POA: Diagnosis not present

## 2016-10-27 DIAGNOSIS — R001 Bradycardia, unspecified: Secondary | ICD-10-CM | POA: Insufficient documentation

## 2016-10-27 DIAGNOSIS — Z7982 Long term (current) use of aspirin: Secondary | ICD-10-CM | POA: Diagnosis not present

## 2016-10-27 DIAGNOSIS — I209 Angina pectoris, unspecified: Secondary | ICD-10-CM

## 2016-10-27 LAB — BASIC METABOLIC PANEL
Anion gap: 8 (ref 5–15)
BUN: 20 mg/dL (ref 6–20)
CO2: 24 mmol/L (ref 22–32)
CREATININE: 1.04 mg/dL (ref 0.61–1.24)
Calcium: 9.9 mg/dL (ref 8.9–10.3)
Chloride: 105 mmol/L (ref 101–111)
GFR calc non Af Amer: >60 mL/min (ref >60)
GLUCOSE: 262 mg/dL — AB (ref 65–99)
Potassium: 4.3 mmol/L (ref 3.5–5.1)
SODIUM: 137 mmol/L (ref 135–145)

## 2016-10-27 LAB — I-STAT TROPONIN, ED: Troponin i, poc: 0 ng/mL (ref 0.00–0.08)

## 2016-10-27 LAB — CBC
HCT: 43.2 % (ref 39.0–52.0)
Hemoglobin: 15 g/dL (ref 13.0–17.0)
MCH: 30.6 pg (ref 26.0–34.0)
MCHC: 34.7 g/dL (ref 30.0–36.0)
MCV: 88.2 fL (ref 78.0–100.0)
Platelets: 155 10*3/uL (ref 150–400)
RBC: 4.9 MIL/uL (ref 4.22–5.81)
RDW: 14.4 % (ref 11.5–15.5)
WBC: 5.3 10*3/uL (ref 4.0–10.5)

## 2016-10-27 LAB — TROPONIN I

## 2016-10-27 LAB — CBG MONITORING, ED: GLUCOSE-CAPILLARY: 223 mg/dL — AB (ref 65–99)

## 2016-10-27 MED ORDER — ASPIRIN 81 MG PO CHEW
324.0000 mg | CHEWABLE_TABLET | Freq: Once | ORAL | Status: AC
  Administered 2016-10-27: 324 mg via ORAL
  Filled 2016-10-27: qty 4

## 2016-10-27 MED ORDER — DOCUSATE SODIUM 100 MG PO CAPS: 100.0000 mg | ORAL_CAPSULE | Freq: Every day | ORAL | Status: DC | PRN

## 2016-10-27 MED ORDER — SODIUM CHLORIDE 0.9 % IV SOLN
INTRAVENOUS | Status: DC
  Administered 2016-10-28 (×3): via INTRAVENOUS

## 2016-10-27 MED ORDER — HEPARIN (PORCINE) IN NACL 100-0.45 UNIT/ML-% IJ SOLN
1000.0000 [IU]/h | INTRAMUSCULAR | Status: DC
  Administered 2016-10-28: 1000 [IU]/h via INTRAVENOUS
  Filled 2016-10-27: qty 250

## 2016-10-27 MED ORDER — ISOSORBIDE MONONITRATE ER 30 MG PO TB24: 30.0000 mg | ORAL_TABLET | Freq: Every day | ORAL | Status: DC

## 2016-10-27 MED ORDER — CLOPIDOGREL BISULFATE 75 MG PO TABS
75.0000 mg | ORAL_TABLET | Freq: Every day | ORAL | Status: DC
  Administered 2016-10-28: 75 mg via ORAL
  Filled 2016-10-27: qty 1

## 2016-10-27 MED ORDER — ACETAMINOPHEN 325 MG PO TABS
650.0000 mg | ORAL_TABLET | ORAL | Status: DC | PRN
  Administered 2016-10-29: 650 mg via ORAL
  Filled 2016-10-27: qty 2

## 2016-10-27 MED ORDER — HEPARIN BOLUS VIA INFUSION
4000.0000 [IU] | Freq: Once | INTRAVENOUS | Status: AC
  Administered 2016-10-28: 4000 [IU] via INTRAVENOUS
  Filled 2016-10-27: qty 4000

## 2016-10-27 MED ORDER — ATORVASTATIN CALCIUM 80 MG PO TABS
80.0000 mg | ORAL_TABLET | Freq: Every day | ORAL | Status: DC
  Administered 2016-10-28: 80 mg via ORAL
  Filled 2016-10-27: qty 1

## 2016-10-27 MED ORDER — NITROGLYCERIN 0.4 MG SL SUBL
0.4000 mg | SUBLINGUAL_TABLET | SUBLINGUAL | Status: DC | PRN
  Administered 2016-10-27: 0.4 mg via SUBLINGUAL
  Filled 2016-10-27: qty 1

## 2016-10-27 MED ORDER — INSULIN ASPART 100 UNIT/ML ~~LOC~~ SOLN
0.0000 [IU] | Freq: Three times a day (TID) | SUBCUTANEOUS | Status: DC
  Administered 2016-10-28: 3 [IU] via SUBCUTANEOUS
  Administered 2016-10-29: 2 [IU] via SUBCUTANEOUS

## 2016-10-27 MED ORDER — PANTOPRAZOLE SODIUM 40 MG PO TBEC
40.0000 mg | DELAYED_RELEASE_TABLET | Freq: Two times a day (BID) | ORAL | Status: DC
  Administered 2016-10-28 – 2016-10-29 (×3): 40 mg via ORAL
  Filled 2016-10-27 (×4): qty 1

## 2016-10-27 MED ORDER — ONDANSETRON HCL 4 MG/2ML IJ SOLN
4.0000 mg | Freq: Four times a day (QID) | INTRAMUSCULAR | Status: DC | PRN
  Administered 2016-10-28 (×2): 4 mg via INTRAVENOUS
  Filled 2016-10-27 (×2): qty 2

## 2016-10-27 MED ORDER — ASPIRIN EC 81 MG PO TBEC
81.0000 mg | DELAYED_RELEASE_TABLET | Freq: Every day | ORAL | Status: DC
  Administered 2016-10-28 (×2): 81 mg via ORAL
  Filled 2016-10-27: qty 1

## 2016-10-27 MED ORDER — ALPRAZOLAM 0.5 MG PO TABS
0.5000 mg | ORAL_TABLET | Freq: Every day | ORAL | Status: DC | PRN
  Administered 2016-10-28 (×2): 0.5 mg via ORAL
  Filled 2016-10-27: qty 1
  Filled 2016-10-27: qty 2

## 2016-10-27 MED ORDER — ATENOLOL 50 MG PO TABS
50.0000 mg | ORAL_TABLET | Freq: Every day | ORAL | Status: DC
  Administered 2016-10-28 (×2): 50 mg via ORAL
  Filled 2016-10-27 (×3): qty 1

## 2016-10-27 MED ORDER — GI COCKTAIL ~~LOC~~: 30.0000 mL | Freq: Four times a day (QID) | ORAL | Status: DC | PRN

## 2016-10-27 MED ORDER — GABAPENTIN 300 MG PO CAPS
300.0000 mg | ORAL_CAPSULE | Freq: Every day | ORAL | Status: DC
  Administered 2016-10-28 (×2): 300 mg via ORAL
  Filled 2016-10-27 (×2): qty 1

## 2016-10-27 MED ORDER — MORPHINE SULFATE (PF) 4 MG/ML IV SOLN
2.0000 mg | INTRAVENOUS | Status: DC | PRN
  Administered 2016-10-28 (×6): 2 mg via INTRAVENOUS
  Filled 2016-10-27 (×6): qty 1

## 2016-10-27 MED ORDER — ACETAMINOPHEN 325 MG PO TABS
650.0000 mg | ORAL_TABLET | Freq: Once | ORAL | Status: AC
  Administered 2016-10-27: 650 mg via ORAL
  Filled 2016-10-27: qty 2

## 2016-10-27 MED ORDER — INSULIN ASPART 100 UNIT/ML ~~LOC~~ SOLN: 0.0000 [IU] | Freq: Every day | SUBCUTANEOUS | Status: DC

## 2016-10-27 MED ORDER — HYDROCHLOROTHIAZIDE 12.5 MG PO CAPS: 12.5000 mg | ORAL_CAPSULE | ORAL | Status: DC

## 2016-10-27 NOTE — ED Notes (Signed)
Pt requesting update; Spoke to Dr. Ladell Pier to update patient.

## 2016-10-27 NOTE — ED Notes (Signed)
Dr. Smith at bedside.

## 2016-10-27 NOTE — ED Notes (Signed)
Patient transported to X-ray 

## 2016-10-27 NOTE — ED Notes (Signed)
Admit md at bedside. Pt and wife are ready to go home and frustrated that the md hasn't updated them on his admission.

## 2016-10-27 NOTE — Progress Notes (Signed)
ANTICOAGULATION CONSULT NOTE - Initial Consult  Pharmacy Consult for Heparin Indication: chest pain/ACS  No Known Allergies  Patient Measurements: Height: 5\' 9"  (175.3 cm) Weight: 181 lb (82.1 kg) IBW/kg (Calculated) : 70.7  Vital Signs: Temp: 97.9 F (36.6 C) (09/13 1817) Temp Source: Oral (09/13 1817) BP: 156/78 (09/13 2315) Pulse Rate: 50 (09/13 2315)  Labs:  Recent Labs  10/27/16 1821 10/27/16 2248  HGB 15.0  --   HCT 43.2  --   PLT 155  --   CREATININE 1.04  --   TROPONINI  --  <0.03    Estimated Creatinine Clearance: 67 mL/min (by C-G formula based on SCr of 1.04 mg/dL).   Medical History: Past Medical History:  Diagnosis Date  . Arthritis    "lower back; right shoulder; left thumb; joints" (06/21/2013)  . Asthma    "not sure if this is true or not" (06/21/2013)  . CAD (coronary artery disease) May 2015   s/p PTCA/DES x 3 to mid LAD, mod non-ob disease in Cx and RCA May 2015; s/p repeat caths - last study in November of 2015 - stents patent, - FFR - managed medically.   [name] GERD (gastroesophageal reflux disease)   . Gout    "maybe twice in my life"  . Headache(784.0)    "weekly for the last 3-4 months" (06/21/2013)  . Hyperlipidemia   . Hypertension   . Myocardial infarction [facility])    "/Dr. Katharina Caper I've had one between 2014-2015) (06/21/2013)  . Osteoarthritis   . Sleep apnea    WEIGHT LOSS, NO LONGER NEEDS PER PATIENT  . Type II diabetes mellitus ([facility])    TYPE 2    Medications:  No current facility-administered medications on file prior to encounter.    Current Outpatient Prescriptions on File Prior to Encounter  Medication Sig Dispense Refill  . acetaminophen (TYLENOL) 500 MG tablet Take 500 mg by mouth every 8 (eight) hours as needed (pain).    [name] ALPRAZolam (XANAX) 0.5 MG tablet Take 0.5 mg by mouth daily as needed for anxiety.     [name] amLODipine (NORVASC) 5 MG tablet Take 1 tablet (5 mg total) by mouth 2 (two) times daily. 180 tablet 3  . aspirin 81 MG  tablet Take 81 mg by mouth at bedtime.     [name] atenolol (TENORMIN) 50 MG tablet Take 1 tablet (50 mg total) by mouth at bedtime. 90 tablet 3  . atorvastatin (LIPITOR) 80 MG tablet Take 80 mg by mouth daily at 6 PM.    . canagliflozin (INVOKANA) 300 MG TABS tablet Take 300 mg by mouth daily before breakfast.    . Cholecalciferol (VITAMIN D PO) Take 1 capsule by mouth daily.    . clopidogrel (PLAVIX) 75 MG tablet Take 1 tablet (75 mg total) by mouth daily with breakfast. 90 tablet 3  . docusate sodium (COLACE) 100 MG capsule Take 100 mg by mouth daily as needed for mild constipation.    . gabapentin (NEURONTIN) 300 MG capsule Take 300 mg by mouth at bedtime.     . hydrochlorothiazide (MICROZIDE) 12.5 MG capsule Take 1 capsule (12.5 mg total) by mouth every Monday, Wednesday, and Friday. 36 capsule 3  . irbesartan (AVAPRO) 300 MG tablet Take 0.5 tablets (150 mg total) by mouth daily. 90 tablet 3  . isosorbide mononitrate (IMDUR) 30 MG 24 hr tablet TAKE 1 TABLET BY MOUTH TWICE (2) DAILY (Patient taking differently: Take 30 mg by mouth daily. TAKE 1 TABLET BY MOUTH TWICE (2)  DAILY) 180 tablet 2  . metFORMIN (GLUCOPHAGE-XR) 500 MG 24 hr tablet Take 2 tablets (1,000 mg total) by mouth 2 (two) times daily.    . nitroGLYCERIN (NITROSTAT) 0.4 MG SL tablet Place 1 tablet (0.4 mg total) under the tongue every 5 (five) minutes as needed. For chest pain 25 tablet 5  . pantoprazole (PROTONIX) 40 MG tablet Take 40 mg by mouth 2 (two) times daily.    . polyethylene glycol (MIRALAX / GLYCOLAX) packet Take 17 g by mouth daily as needed for mild constipation.    . ranolazine (RANEXA) 500 MG 12 hr tablet Take 1 tablet (500 mg total) by mouth 2 (two) times daily. 180 tablet 3  . traMADol (ULTRAM) 50 MG tablet Take 50-100 mg by mouth every 6 (six) hours as needed (pain).       Assessment: 71 y.o. male with chest pain for heparin Goal of Therapy:  Heparin level 0.3-0.7 units/ml Monitor platelets by anticoagulation  protocol: Yes   Plan:  Heparin 4000 units IV bolus, then start heparin 1000 units/hr Check heparin level in 8 hours.   Caryl Pina 10/27/2016,11:42 PM

## 2016-10-27 NOTE — ED Notes (Signed)
Pt c/o intermittent sharp pains with chest discomforts between episodes; radiates into back; pt reports some relief when taking nitro at home for pain. Dr.Briggs made aware

## 2016-10-27 NOTE — H&P (Signed)
History and Physical    Hunter Mcbride. UXL:[id number] DOB: [birth year] DOA: 10/27/2016  Referring MD/NP/PA: Dr. Judieth Keens) PCP: Crist Infante, MD  Patient coming from: Home  Chief Complaint: left sided chest pain  HPI: Hunter Mcbride. is a 71 y.o. male with medical history significant of HTN, HLD, CAD( s/p PTCA/DES x 3 to mid LAD May 2015, then PTCA/DES x 1 proximal posterolateral artery in April 2018), DM type II, and [name]; who presents with complaints of left-sided chest pain since yesterday night. Patient notes that he was at rest when symptoms first started. He reports feeling a sharp pain that went across the left side of his chest and radiated down his left arm. Patient took approximately 3 nitroglycerin with mild relief for a few minutes before return of symptoms. Associated symptoms included nausea, lightheadedness, and feelings of fatigue for the last 2 days. When he woke this morning he was working out in the yard and reports symptoms returning every fifth 15 minutes or so which he took a total of approximately 5 nitroglycerin. Patient felt symptoms were slightly different and more intense as he normally patient has pain in the middle of his back between his shoulders blades that he describes as a burning sensation when it is pain related to his heart. He admits that the pain between his shoulder blades has also been present over the last 1-2 days. Denies any diaphoresis, vomiting, shortness of breath, loss of consciousness, leg swelling, recent travel, or travel. He is followed by the Dr. Burt Knack and Cathie Olden of cardiology. During patient's last hospitalization in April 2018 for chest pain appears patient had negative cardiac enzymes 3 with no significant EKG changes.  However, was found to have significant disease on cardiac cath requiring stent placement and balloon angioplasty. Patient reports being compliant with all home medications.   ED Course: Upon admission to the  emergency department patient was noted to be afebrile, heart rates 50-60, blood pressure 127/78 to 178/80, and all other vitals within normal limits. Labs revealed glucose 262 without elevated anion gap and initial troponin 0.00. EKG showed no significant ischemic changes. Patient was given nitroglycerin and 324 mg of aspirin to chew. TR[facility] called to admit. Patient reports having frequent episodes of repeat chest pain while in the emergency department.  Review of Systems  Constitutional: Positive for malaise/fatigue and weight loss. Negative for chills and fever.  HENT: Negative for ear discharge and nosebleeds.   Eyes: Negative for double vision and pain.  Respiratory: Negative for hemoptysis and shortness of breath.   Cardiovascular: Positive for chest pain. Negative for leg swelling.  Gastrointestinal: Positive for nausea. Negative for constipation and vomiting.  Genitourinary: Negative for frequency and urgency.  Musculoskeletal: Positive for back pain. Negative for falls.  Skin: Negative for itching and rash.  Neurological: Positive for dizziness. Negative for speech change.  Psychiatric/Behavioral: Negative for hallucinations and substance abuse.    Past Medical History:  Diagnosis Date  . Arthritis    "lower back; right shoulder; left thumb; joints" (06/21/2013)  . Asthma    "not sure if this is true or not" (06/21/2013)  . CAD (coronary artery disease) May 2015   s/p PTCA/DES x 3 to mid LAD, mod non-ob disease in Cx and RCA May 2015; s/p repeat caths - last study in November of 2015 - stents patent, - FFR - managed medically.   [name] GERD (gastroesophageal reflux disease)   . Gout    "maybe twice in my life"  .  Headache(784.0)    "weekly for the last 3-4 months" (06/21/2013)  . Hyperlipidemia   . Hypertension   . Myocardial infarction [facility])    "/Dr. Katharina Caper I've had one between 2014-2015) (06/21/2013)  . Osteoarthritis   . Sleep apnea    WEIGHT LOSS, NO LONGER NEEDS PER PATIENT  . Type  II diabetes mellitus ([place])    TYPE 2    Past Surgical History:  Procedure Laterality Date  . CARDIAC CATHETERIZATION  1980's   "once"  . CARDIAC CATHETERIZATION N/A 07/23/2015   Procedure: Left Heart Cath and Coronary Angiography;  Surgeon: Sherren Mocha, MD;  Location: [place] [facility];  Service: Cardiovascular;  Laterality: N/A;  . CARDIOVASCULAR STRESS TEST  06/10/2008   EF 68%  . CARPAL TUNNEL RELEASE Bilateral   . CORONARY ANGIOPLASTY WITH STENT PLACEMENT  06/21/2013   "3"  . CORONARY STENT INTERVENTION N/A 06/07/2016   Procedure: Coronary Stent Intervention;  Surgeon: Burnell Blanks, MD;  Location: [place] [facility];  Service: Cardiovascular;  Laterality: N/A;  . EXCISION MORTON'S NEUROMA Left   . EYE SURGERY Left    "removed film over my eye"  . FRACTIONAL FLOW RESERVE WIRE N/A 06/26/2013   Procedure: FRACTIONAL FLOW RESERVE WIRE;  Surgeon: [name], MD;  Location: [place] [facility];  Service: Cardiovascular;  Laterality: N/A;  . HERNIA REPAIR Left   . JOINT REPLACEMENT Left 03/2013   "thumb"  . LEFT HEART CATH AND CORONARY ANGIOGRAPHY N/A 06/07/2016   Procedure: Left Heart Cath and Coronary Angiography;  Surgeon: Burnell Blanks, MD;  Location: [place] [facility];  Service: Cardiovascular;  Laterality: N/A;  . LEFT HEART CATHETERIZATION WITH CORONARY ANGIOGRAM N/A 06/21/2013   Procedure: LEFT HEART CATHETERIZATION WITH CORONARY ANGIOGRAM;  Surgeon: Burnell Blanks, MD;  Location: [facility];  Service: Cardiovascular;  Laterality: N/A;  . LEFT HEART CATHETERIZATION WITH CORONARY ANGIOGRAM N/A 06/26/2013   Procedure: LEFT HEART CATHETERIZATION WITH CORONARY ANGIOGRAM;  Surgeon: [name], MD;  Location: [place] [facility];  Service: Cardiovascular;  Laterality: N/A;  . TONSILLECTOMY    . TOTAL HIP ARTHROPLASTY Right 04/12/2016   Procedure: RIGHT TOTAL HIP ARTHROPLASTY ANTERIOR APPROACH;  Surgeon: Paralee Cancel, MD;  Location: [facility];  Service: Orthopedics;   Laterality: Right;  requests 70 mins  . TOTAL SHOULDER ARTHROPLASTY  03/01/2012   Procedure: TOTAL SHOULDER ARTHROPLASTY;  Surgeon: Marin Shutter, MD;  Location: [place];  Service: Orthopedics;  Laterality: Right;  . UMBILICAL HERNIA REPAIR       reports that he has never smoked. He has never used smokeless tobacco. He reports that he does not drink alcohol or use drugs.  No Known Allergies  Family History  Problem Relation Age of Onset  . Diabetes type II Mother   . Heart attack Father   . Coronary artery disease Cousin   . Heart disease Brother     Prior to Admission medications   Medication Sig Start Date End Date Taking? Authorizing Provider  acetaminophen (TYLENOL) 500 MG tablet Take 500 mg by mouth every 8 (eight) hours as needed (pain).   Yes [provider]  ALPRAZolam Duanne Moron) 0.5 MG tablet Take 0.5 mg by mouth daily as needed for anxiety.    Yes [provider]  amLODipine (NORVASC) 5 MG tablet Take 1 tablet (5 mg total) by mouth 2 (two) times daily. 06/15/16  Yes Burtis Junes, NP  aspirin 81 MG tablet Take 81 mg by mouth at bedtime.  Yes [provider]  atenolol (TENORMIN) 50 MG tablet Take 1 tablet (50 mg total) by mouth at bedtime. 06/15/16  Yes Burtis Junes, NP  atorvastatin (LIPITOR) 80 MG tablet Take 80 mg by mouth daily at 6 PM.   Yes [provider]  canagliflozin (INVOKANA) 300 MG TABS tablet Take 300 mg by mouth daily before breakfast.   Yes [provider]  Cholecalciferol (VITAMIN D PO) Take 1 capsule by mouth daily.   Yes [provider]  clopidogrel (PLAVIX) 75 MG tablet Take 1 tablet (75 mg total) by mouth daily with breakfast. 06/15/16  Yes Burtis Junes, NP  docusate sodium (COLACE) 100 MG capsule Take 100 mg by mouth daily as needed for mild constipation.   Yes [provider]  gabapentin (NEURONTIN) 300 MG capsule Take 300 mg by mouth at bedtime.    Yes [provider]    hydrochlorothiazide (MICROZIDE) 12.5 MG capsule Take 1 capsule (12.5 mg total) by mouth every Monday, Wednesday, and Friday. 06/15/16  Yes Burtis Junes, NP  irbesartan (AVAPRO) 300 MG tablet Take 0.5 tablets (150 mg total) by mouth daily. 02/25/15  Yes Barton Dubois, MD  isosorbide mononitrate (IMDUR) 30 MG 24 hr tablet TAKE 1 TABLET BY MOUTH TWICE (2) DAILY Patient taking differently: Take 30 mg by mouth daily. TAKE 1 TABLET BY MOUTH TWICE (2) DAILY 05/31/16  Yes Burtis Junes, NP  metFORMIN (GLUCOPHAGE-XR) 500 MG 24 hr tablet Take 2 tablets (1,000 mg total) by mouth 2 (two) times daily. 06/08/16  Yes Daune Perch, NP  nitroGLYCERIN (NITROSTAT) 0.4 MG SL tablet Place 1 tablet (0.4 mg total) under the tongue every 5 (five) minutes as needed. For chest pain 06/15/16  Yes Burtis Junes, NP  pantoprazole (PROTONIX) 40 MG tablet Take 40 mg by mouth 2 (two) times daily.   Yes [provider]  polyethylene glycol (MIRALAX / GLYCOLAX) packet Take 17 g by mouth daily as needed for mild constipation.   Yes [provider]  ranolazine (RANEXA) 500 MG 12 hr tablet Take 1 tablet (500 mg total) by mouth 2 (two) times daily. 07/01/16  Yes Nahser, Wonda Cheng, MD  traMADol (ULTRAM) 50 MG tablet Take 50-100 mg by mouth every 6 (six) hours as needed (pain).   Yes [provider]    Physical Exam:  Constitutional: NAD, calm, comfortable Vitals:   10/27/16 1819 10/27/16 1900 10/27/16 1915 10/27/16 2022  BP:  130/75 127/78 (!) 146/83  Pulse:  (!) 54 (!) 57 (!) 50  Resp:  14 15 14   Temp:      TempSrc:      SpO2:  97% 98% 99%  Weight: 82.1 kg (181 lb)     Height: 5\' 9"  (1.753 m)      Eyes: PERRL, lids and conjunctivae normal ENMT: Mucous membranes are moist. Posterior pharynx clear of any exudate or lesions.Normal dentition.  Neck: normal, supple, no masses, no thyromegaly Respiratory: clear to auscultation bilaterally, no wheezing, no crackles. Normal respiratory effort. No  accessory muscle use.  Cardiovascular: Bradycardic, no murmurs / rubs / gallops. No extremity edema. 2+ pedal pulses. No carotid bruits. Tenderness to palpation not reproducible. Abdomen: no tenderness, no masses palpated. No hepatosplenomegaly. Bowel sounds positive.  Musculoskeletal: no clubbing / cyanosis. No joint deformity upper and lower extremities. Good ROM, no contractures. Normal muscle tone.  Skin: no rashes, lesions, ulcers. No induration Neurologic: CN 2-12 grossly intact. Sensation intact, DTR normal. Strength 5/5 in all 4.  Psychiatric: Normal judgment and insight. Alert and oriented x 3. Normal mood.     Labs on Admission: I have personally reviewed following labs and imaging studies  CBC:  Recent Labs Lab 10/27/16 1821  WBC 5.3  HGB 15.0  HCT 43.2  MCV 88.2  PLT 951   Basic Metabolic Panel:  Recent Labs Lab 10/27/16 1821  NA 137  K 4.3  CL 105  CO2 24  GLUCOSE 262*  BUN 20  CREATININE 1.04  CALCIUM 9.9   GFR: Estimated Creatinine Clearance: 67 mL/min (by C-G formula based on SCr of 1.04 mg/dL). Liver Function Tests: No results for input(s): AST, ALT, ALKPHOS, BILITOT, PROT, ALBUMIN in the last 168 hours. No results for input(s): LIPASE, AMYLASE in the last 168 hours. No results for input(s): AMMONIA in the last 168 hours. Coagulation Profile: No results for input(s): INR, PROTIME in the last 168 hours. Cardiac Enzymes: No results for input(s): CKTOTAL, CKMB, CKMBINDEX, TROPONINI in the last 168 hours. BNP (last 3 results) No results for input(s): PROBNP in the last 8760 hours. HbA1C: No results for input(s): HGBA1C in the last 72 hours. CBG:  Recent Labs Lab 10/27/16 1848  GLUCAP 223*   Lipid Profile: No results for input(s): CHOL, HDL, LDLCALC, TRIG, CHOLHDL, LDLDIRECT in the last 72 hours. Thyroid Function Tests: No results for input(s): TSH, T4TOTAL, FREET4, T3FREE, THYROIDAB in the last 72 hours. Anemia Panel: No results for  input(s): VITAMINB12, FOLATE, FERRITIN, TIBC, IRON, RETICCTPCT in the last 72 hours. Urine analysis: No results found for: COLORURINE, APPEARANCEUR, LABSPEC, PHURINE, GLUCOSEU, HGBUR, BILIRUBINUR, KETONESUR, PROTEINUR, UROBILINOGEN, NITRITE, LEUKOCYTESUR Sepsis Labs: No results found for this or any previous visit (from the past 240 hour(s)).   Radiological Exams on Admission: Dg Chest 2 View  Result Date: 10/27/2016 CLINICAL DATA:  chest pain. Pt has had cp since yesterday. He said it happens off and on a lot but, has been bad since yesterday. He feels pain in his left side chest from left nipple to center of chest. He said it will also hurt straight through into his back as well. Pt mentioned usually he'll feel the pain start in his back first, then into his chest. Pt explained he has experienced a lot of trouble with his heart in the past. Hx of CAD, HTN, MI, DM2, pt has had heart numerous heart catheterizations and stents EXAM: CHEST  2 VIEW COMPARISON:  07/08/2016 FINDINGS: Cardiac silhouette is normal in size and configuration. No mediastinal or hilar masses. No evidence of adenopathy. Clear lungs. No pleural effusion or pneumothorax. Right shoulder prosthesis, partly imaged appears well-seated and aligned. IMPRESSION: 1. No active cardiopulmonary disease. Electronically Signed   By: Lajean Manes M.D.   On: 10/27/2016 19:50    EKG: Independently reviewed. Bradycardia with T-wave abnormality  Assessment/Plan Chest pain/Question Unstable angina:Acute. Patient history is concerning given previous need for stenting in 05/2016 with negative cardiac enzymes, EKG without significant ischemic changes. Initial troponin negative. Heart score = 6. Patient received 325 mg aspirin. - Admit to stepdown bed - Trend cardiac enzymes 3 - Heparin drip per pharmacy - Continue Plavix and aspirin - Repeat EKG in a.m. - Check urine drug screen - check Lipid panel in a.m - Message sent to [facility]iller for  cards to see in a.m  Coronary artery disease:  s/p PTCA/DES x 3 to mid LAD May 2015, then PTCA/DES x 1 proximal posterolateral artery in April 2018.    Essential Hypertension  - Continue hydrochlorothiazide and  irbesartan - Hold atenolol for possible need of repeat stress tests  Diabetes mellitus type 2 with hyperglycemia: Patient's initial blood sugar elevated up to 262 on admission. Last hemoglobin A1c available with 7.1 in 05/2016. - Hypoglycemic protocols - Hold metformin and Invokona - Continue gabapentin  - CBG q Ac and HS SSI  Hyperlipidemia - Continue atorvastatin   GERD - Continue Protonix  DVT prophylaxis: heparin Code Status: Full Family Communication: Discussed plan of care with patient and family present at bedside Disposition Plan: TBD  Consults called: none Admission status: observation  Norval Morton MD [facility] Pager [phone]   If 7PM-7AM, please contact night-coverage [email or web address] Password [facility]  10/27/2016, 10:23 PM

## 2016-10-27 NOTE — ED Notes (Signed)
[place] sandwich and diet coke given

## 2016-10-27 NOTE — ED Triage Notes (Signed)
Pt arrives to ED pov with complaints of sharp left sided chest pain and pain in back between shoulder blades. PT has extensive cardiac hx. Endorses fatigue, nausea, sob. Took 5 NTG pta with some relief

## 2016-10-27 NOTE — ED Notes (Signed)
While in talking with pt, pt c/o pain to left chest that "hits" him and then slowly eases. C/O pain in middle of his back that is constant and the nitro pills do nothing for. Ask this RN to tell Dr. Vanita Panda about the pain in his chest. Pt does not want to try the 2nd or 3rd nitro. States that he doesn't think its going to help his chest pain. States that it will ease it, but not take it away. Md aware at this time. VSS.

## 2016-10-27 NOTE — ED Notes (Signed)
Pt up to restroom. Steady on feet. Wife at bedside. VSS. Minimal pain at this time. Awaiting re-eval by md.

## 2016-10-27 NOTE — ED Provider Notes (Signed)
[place] DEPT Provider Note   CSN: [id number] Arrival date & time: 10/27/16  1812     History   Chief Complaint Chief Complaint  Patient presents with  . Chest Pain    HPI Hunter Mcbride. is a 71 y.o. male.  This is a 70 year old male with PMH of CAD status post 4 stents, most recent in 05/2016, angina at rest, T2 DM, HTN, HLD who presents with shortness of breath for the past 2 days.  Patient states he was at home sitting on his couch when he had a sudden onset of left-sided chest pain that radiates to his left side of his back.  He denies any dyspnea, cough, blurry vision, numbness or tingling his extremities, vomiting.  He stated he took progressive doses of nitroglycerin which relieved his chest pain intermittently but it returned and was persistent.  He continues to endorse chest pain at this time which restarted earlier this afternoon.  No leg swelling, recent travel.     The history is provided by the patient and the spouse.    Past Medical History:  Diagnosis Date  . Arthritis    "lower back; right shoulder; left thumb; joints" (06/21/2013)  . Asthma    "not sure if this is true or not" (06/21/2013)  . CAD (coronary artery disease) May 2015   s/p PTCA/DES x 3 to mid LAD, mod non-ob disease in Cx and RCA May 2015; s/p repeat caths - last study in November of 2015 - stents patent, - FFR - managed medically.   [name] GERD (gastroesophageal reflux disease)   . Gout    "maybe twice in my life"  . Headache(784.0)    "weekly for the last 3-4 months" (06/21/2013)  . Hyperlipidemia   . Hypertension   . Myocardial infarction [facility])    "/Dr. Katharina Caper I've had one between 2014-2015) (06/21/2013)  . Osteoarthritis   . Sleep apnea    WEIGHT LOSS, NO LONGER NEEDS PER PATIENT  . Type II diabetes mellitus ([place])    TYPE 2    Patient Active Problem List   Diagnosis Date Noted  . Overweight (BMI 25.0-29.9) 04/13/2016  . S/P right THA, AA 04/12/2016  . Angina at rest [facility]) 02/25/2015    . Type 2 diabetes mellitus treated without insulin ([place])   . Coronary atherosclerosis of native coronary artery 06/27/2013  . Unstable angina ([place]) 06/21/2013  . Chest pain 06/20/2013  . Shoulder arthritis 03/02/2012  . Hypertension 04/11/2011  . Hyperlipidemia 04/11/2011  . Diabetes mellitus ([place]) 04/11/2011  . Chest tightness 10/13/2010    Past Surgical History:  Procedure Laterality Date  . CARDIAC CATHETERIZATION  1980's   "once"  . CARDIAC CATHETERIZATION N/A 07/23/2015   Procedure: Left Heart Cath and Coronary Angiography;  Surgeon: Sherren Mocha, MD;  Location: [place] CV LAB;  Service: Cardiovascular;  Laterality: N/A;  . CARDIOVASCULAR STRESS TEST  06/10/2008   EF 68%  . CARPAL TUNNEL RELEASE Bilateral   . CORONARY ANGIOPLASTY WITH STENT PLACEMENT  06/21/2013   "3"  . CORONARY STENT INTERVENTION N/A 06/07/2016   Procedure: Coronary Stent Intervention;  Surgeon: Burnell Blanks, MD;  Location: [place] CV LAB;  Service: Cardiovascular;  Laterality: N/A;  . EXCISION MORTON'S NEUROMA Left   . EYE SURGERY Left    "removed film over my eye"  . FRACTIONAL FLOW RESERVE WIRE N/A 06/26/2013   Procedure: FRACTIONAL FLOW RESERVE WIRE;  Surgeon: [name], MD;  Location: [place] CATH LAB;  Service:  Cardiovascular;  Laterality: N/A;  . HERNIA REPAIR Left   . JOINT REPLACEMENT Left 03/2013   "thumb"  . LEFT HEART CATH AND CORONARY ANGIOGRAPHY N/A 06/07/2016   Procedure: Left Heart Cath and Coronary Angiography;  Surgeon: Burnell Blanks, MD;  Location: [place] CV LAB;  Service: Cardiovascular;  Laterality: N/A;  . LEFT HEART CATHETERIZATION WITH CORONARY ANGIOGRAM N/A 06/21/2013   Procedure: LEFT HEART CATHETERIZATION WITH CORONARY ANGIOGRAM;  Surgeon: Burnell Blanks, MD;  Location: [facility] CATH LAB;  Service: Cardiovascular;  Laterality: N/A;  . LEFT HEART CATHETERIZATION WITH CORONARY ANGIOGRAM N/A 06/26/2013   Procedure: LEFT HEART CATHETERIZATION WITH CORONARY  ANGIOGRAM;  Surgeon: [name], MD;  Location: [place] CATH LAB;  Service: Cardiovascular;  Laterality: N/A;  . TONSILLECTOMY    . TOTAL HIP ARTHROPLASTY Right 04/12/2016   Procedure: RIGHT TOTAL HIP ARTHROPLASTY ANTERIOR APPROACH;  Surgeon: Paralee Cancel, MD;  Location: [facility] ORS;  Service: Orthopedics;  Laterality: Right;  requests 70 mins  . TOTAL SHOULDER ARTHROPLASTY  03/01/2012   Procedure: TOTAL SHOULDER ARTHROPLASTY;  Surgeon: Marin Shutter, MD;  Location: [place];  Service: Orthopedics;  Laterality: Right;  . UMBILICAL HERNIA REPAIR         Home Medications    Prior to Admission medications   Medication Sig Start Date End Date Taking? Authorizing Provider  acetaminophen (TYLENOL) 500 MG tablet Take 500 mg by mouth every 8 (eight) hours as needed (pain).   Yes [provider]  ALPRAZolam Duanne Moron) 0.5 MG tablet Take 0.5 mg by mouth daily as needed for anxiety.    Yes [provider]  amLODipine (NORVASC) 5 MG tablet Take 1 tablet (5 mg total) by mouth 2 (two) times daily. 06/15/16  Yes Burtis Junes, NP  aspirin 81 MG tablet Take 81 mg by mouth at bedtime.    Yes [provider]  atenolol (TENORMIN) 50 MG tablet Take 1 tablet (50 mg total) by mouth at bedtime. 06/15/16  Yes Burtis Junes, NP  atorvastatin (LIPITOR) 80 MG tablet Take 80 mg by mouth daily at 6 PM.   Yes [provider]  canagliflozin (INVOKANA) 300 MG TABS tablet Take 300 mg by mouth daily before breakfast.   Yes [provider]  Cholecalciferol (VITAMIN D PO) Take 1 capsule by mouth daily.   Yes [provider]  clopidogrel (PLAVIX) 75 MG tablet Take 1 tablet (75 mg total) by mouth daily with breakfast. 06/15/16  Yes Burtis Junes, NP  docusate sodium (COLACE) 100 MG capsule Take 100 mg by mouth daily as needed for mild constipation.   Yes [provider]  gabapentin (NEURONTIN) 300 MG capsule Take 300 mg by mouth at bedtime.    Yes [provider]    hydrochlorothiazide (MICROZIDE) 12.5 MG capsule Take 1 capsule (12.5 mg total) by mouth every Monday, Wednesday, and Friday. 06/15/16  Yes Burtis Junes, NP  irbesartan (AVAPRO) 300 MG tablet Take 0.5 tablets (150 mg total) by mouth daily. 02/25/15  Yes Barton Dubois, MD  isosorbide mononitrate (IMDUR) 30 MG 24 hr tablet TAKE 1 TABLET BY MOUTH TWICE (2) DAILY Patient taking differently: Take 30 mg by mouth daily. TAKE 1 TABLET BY MOUTH TWICE (2) DAILY 05/31/16  Yes Burtis Junes, NP  metFORMIN (GLUCOPHAGE-XR) 500 MG 24 hr tablet Take 2 tablets (1,000 mg total) by mouth 2 (two) times daily. 06/08/16  Yes Daune Perch, NP  nitroGLYCERIN (NITROSTAT) 0.4 MG SL tablet Place 1 tablet (0.4 mg  total) under the tongue every 5 (five) minutes as needed. For chest pain 06/15/16  Yes Burtis Junes, NP  pantoprazole (PROTONIX) 40 MG tablet Take 40 mg by mouth 2 (two) times daily.   Yes [provider]  polyethylene glycol (MIRALAX / GLYCOLAX) packet Take 17 g by mouth daily as needed for mild constipation.   Yes [provider]  ranolazine (RANEXA) 500 MG 12 hr tablet Take 1 tablet (500 mg total) by mouth 2 (two) times daily. 07/01/16  Yes Nahser, Wonda Cheng, MD  traMADol (ULTRAM) 50 MG tablet Take 50-100 mg by mouth every 6 (six) hours as needed (pain).   Yes [provider]    Family History Family History  Problem Relation Age of Onset  . Diabetes type II Mother   . Heart attack Father   . Coronary artery disease Cousin   . Heart disease Brother     Social History Social History  Substance Use Topics  . Smoking status: Never Smoker  . Smokeless tobacco: Never Used  . Alcohol use No     Allergies   Patient has no known allergies.   Review of Systems Review of Systems  Constitutional: Positive for fatigue. Negative for chills, diaphoresis and fever.  HENT: Negative for ear pain and sore throat.   Eyes: Negative for pain and visual disturbance.  Respiratory:  Negative for cough, chest tightness, shortness of breath and wheezing.   Cardiovascular: Positive for chest pain. Negative for palpitations and leg swelling.  Gastrointestinal: Positive for nausea. Negative for abdominal pain, constipation, diarrhea and vomiting.  Genitourinary: Negative for dysuria and hematuria.  Musculoskeletal: Negative for arthralgias and back pain.  Skin: Negative for color change and rash.  Neurological: Negative for seizures and syncope.  All other systems reviewed and are negative.    Physical Exam Updated Vital Signs BP (!) 156/78   Pulse (!) 50   Temp 97.9 F (36.6 C) (Oral)   Resp 14   Ht 5\' 9"  (1.753 m)   Wt 82.1 kg (181 lb)   SpO2 100%   BMI 26.73 kg/m   Physical Exam  Constitutional: He appears well-developed and well-nourished.  HENT:  Head: Normocephalic and atraumatic.  Eyes: Conjunctivae are normal.  Neck: Neck supple.  Cardiovascular: Normal rate, regular rhythm and intact distal pulses.   No murmur heard. Pulmonary/Chest: Effort normal and breath sounds normal. No respiratory distress. He exhibits no tenderness.  Abdominal: Soft. There is no tenderness.  Musculoskeletal: He exhibits no edema.       Thoracic back: He exhibits tenderness and pain. He exhibits no bony tenderness and no deformity.  Tenderness to left back, T4 region. No rashes noted.   Neurological: He is alert.  Skin: Skin is warm and dry.  Psychiatric: He has a normal mood and affect.  Nursing note and vitals reviewed.    ED Treatments / Results  Labs (all labs ordered are listed, but only abnormal results are displayed) Labs Reviewed  BASIC METABOLIC PANEL - Abnormal; Notable for the following:       Result Value   Glucose, Bld 262 (*)    All other components within normal limits  CBG MONITORING, ED - Abnormal; Notable for the following:    Glucose-Capillary 223 (*)    All other components within normal limits  CBC  TROPONIN I  TROPONIN I  TROPONIN I    TROPONIN I  LIPID PANEL  CBC WITH DIFFERENTIAL/PLATELET  PROTIME-INR  BASIC METABOLIC PANEL  HEPARIN LEVEL (  UNFRACTIONATED)  I-STAT TROPONIN, ED    EKG  EKG Interpretation  Date/Time:  Thursday October 27 2016 18:15:49 EDT Ventricular Rate:  62 PR Interval:  198 QRS Duration: 94 QT Interval:  394 QTC Calculation: 399 R Axis:   32 Text Interpretation:  Normal sinus rhythm Artifact T wave abnormality Borderline ECG Confirmed by Carmin Muskrat [phone]) on 10/27/2016 7:15:29 PM       Radiology Dg Chest 2 View  Result Date: 10/27/2016 CLINICAL DATA:  chest pain. Pt has had cp since yesterday. He said it happens off and on a lot but, has been bad since yesterday. He feels pain in his left side chest from left nipple to center of chest. He said it will also hurt straight through into his back as well. Pt mentioned usually he'll feel the pain start in his back first, then into his chest. Pt explained he has experienced a lot of trouble with his heart in the past. Hx of CAD, HTN, MI, DM2, pt has had heart numerous heart catheterizations and stents EXAM: CHEST  2 VIEW COMPARISON:  07/08/2016 FINDINGS: Cardiac silhouette is normal in size and configuration. No mediastinal or hilar masses. No evidence of adenopathy. Clear lungs. No pleural effusion or pneumothorax. Right shoulder prosthesis, partly imaged appears well-seated and aligned. IMPRESSION: 1. No active cardiopulmonary disease. Electronically Signed   By: Lajean Manes M.D.   On: 10/27/2016 19:50    Procedures Procedures (including critical care time)  Medications Ordered in ED Medications  nitroGLYCERIN (NITROSTAT) SL tablet 0.4 mg (0.4 mg Sublingual Given 10/27/16 2030)  atenolol (TENORMIN) tablet 50 mg (not administered)  clopidogrel (PLAVIX) tablet 75 mg (not administered)  hydrochlorothiazide (MICROZIDE) capsule 12.5 mg (not administered)  gabapentin (NEURONTIN) capsule 300 mg (not administered)  docusate sodium (COLACE)  capsule 100 mg (not administered)  isosorbide mononitrate (IMDUR) 24 hr tablet 30 mg (not administered)  pantoprazole (PROTONIX) EC tablet 40 mg (not administered)  atorvastatin (LIPITOR) tablet 80 mg (not administered)  aspirin tablet 81 mg (not administered)  ALPRAZolam (XANAX) tablet 0.5 mg (not administered)  acetaminophen (TYLENOL) tablet 650 mg (not administered)  ondansetron (ZOFRAN) injection 4 mg (not administered)  0.9 %  sodium chloride infusion (not administered)  morphine 4 MG/ML injection 2 mg (not administered)  gi cocktail (Maalox,Lidocaine,Donnatal) (not administered)  insulin aspart (novoLOG) injection 0-15 Units (not administered)  insulin aspart (novoLOG) injection 0-5 Units (not administered)  heparin bolus via infusion 4,000 Units (not administered)  heparin ADULT infusion 100 units/mL (25000 units/213mL sodium chloride 0.45%) (not administered)  aspirin chewable tablet 324 mg (324 mg Oral Given 10/27/16 1854)  acetaminophen (TYLENOL) tablet 650 mg (650 mg Oral Given 10/27/16 2030)     Initial Impression / Assessment and Plan / ED Course  I have reviewed the triage vital signs and the nursing notes.  Pertinent labs & imaging results that were available during my care of the patient were reviewed by me and considered in my medical decision making (see chart for details).     This is a 71 year old male with PMH of CAD status post 4 stents, most recent in 05/2016, angina at rest, T2 DM, HTN, HLD who presents with shortness of breath for the past 2 days.   Exam as noted above.  No discrepancy between upper extremity pulses or lower extremity pulses with upper.  ASA was given. Nitro was given along with Tylenol. Chest XR and EKG performed. EKG: unchanged from previous tracings, normal sinus rhythm with chronic T wave inversions  in Lead II compared to prior EKG.   HEART score calculation: cannot perform due to active CAD.  Unlikely PNA as CXR shows no acute  cardiopulmonary abnormality, no leukocytosis, no cough, no fever. Unlikely PE as atypical presentation, low risk per [place].  Discussed admission with hospitalist due to high risk CAD and continued chest pain. Will admit for observation and discussion for further cardiac testing.   Labs and imaging reviewed by myself and considered in medical decision making.  Imaging interpreted by radiology.   Final Clinical Impressions(s) / ED Diagnoses   Final diagnoses:  Unstable angina pectoris [facility])    New Prescriptions New Prescriptions   No medications on file     Aldona Lento, MD 10/28/16 0009    Carmin Muskrat, MD 10/31/16 1128

## 2016-10-28 ENCOUNTER — Other Ambulatory Visit: Payer: Self-pay | Admitting: *Deleted

## 2016-10-28 ENCOUNTER — Encounter (HOSPITAL_COMMUNITY)
Admission: EM | Disposition: A | Discharge status: Home / Self Care | Payer: Self-pay | Source: Home / Self Care | Attending: Emergency Medicine

## 2016-10-28 ENCOUNTER — Observation Stay (HOSPITAL_BASED_OUTPATIENT_CLINIC_OR_DEPARTMENT_OTHER): Payer: PPO

## 2016-10-28 DIAGNOSIS — I200 Unstable angina: Secondary | ICD-10-CM | POA: Diagnosis not present

## 2016-10-28 DIAGNOSIS — I2584 Coronary atherosclerosis due to calcified coronary lesion: Secondary | ICD-10-CM | POA: Diagnosis not present

## 2016-10-28 DIAGNOSIS — Z01810 Encounter for preprocedural cardiovascular examination: Secondary | ICD-10-CM

## 2016-10-28 DIAGNOSIS — Z8249 Family history of ischemic heart disease and other diseases of the circulatory system: Secondary | ICD-10-CM | POA: Diagnosis not present

## 2016-10-28 DIAGNOSIS — Z7982 Long term (current) use of aspirin: Secondary | ICD-10-CM | POA: Diagnosis not present

## 2016-10-28 DIAGNOSIS — G4733 Obstructive sleep apnea (adult) (pediatric): Secondary | ICD-10-CM | POA: Diagnosis not present

## 2016-10-28 DIAGNOSIS — I10 Essential (primary) hypertension: Secondary | ICD-10-CM | POA: Diagnosis not present

## 2016-10-28 DIAGNOSIS — M1990 Unspecified osteoarthritis, unspecified site: Secondary | ICD-10-CM | POA: Diagnosis not present

## 2016-10-28 DIAGNOSIS — I2510 Atherosclerotic heart disease of native coronary artery without angina pectoris: Secondary | ICD-10-CM

## 2016-10-28 DIAGNOSIS — Z7902 Long term (current) use of antithrombotics/antiplatelets: Secondary | ICD-10-CM | POA: Diagnosis not present

## 2016-10-28 DIAGNOSIS — E785 Hyperlipidemia, unspecified: Secondary | ICD-10-CM | POA: Diagnosis not present

## 2016-10-28 DIAGNOSIS — I25110 Atherosclerotic heart disease of native coronary artery with unstable angina pectoris: Secondary | ICD-10-CM | POA: Diagnosis not present

## 2016-10-28 DIAGNOSIS — Z955 Presence of coronary angioplasty implant and graft: Secondary | ICD-10-CM | POA: Diagnosis not present

## 2016-10-28 DIAGNOSIS — K219 Gastro-esophageal reflux disease without esophagitis: Secondary | ICD-10-CM | POA: Diagnosis present

## 2016-10-28 DIAGNOSIS — Z79899 Other long term (current) drug therapy: Secondary | ICD-10-CM | POA: Diagnosis not present

## 2016-10-28 DIAGNOSIS — J45909 Unspecified asthma, uncomplicated: Secondary | ICD-10-CM | POA: Diagnosis not present

## 2016-10-28 HISTORY — PX: LEFT HEART CATH AND CORONARY ANGIOGRAPHY: CATH118249

## 2016-10-28 HISTORY — PX: CORONARY PRESSURE/FFR STUDY: CATH118243

## 2016-10-28 LAB — CBC WITH DIFFERENTIAL/PLATELET
Basophils Absolute: 0 10*3/uL (ref 0.0–0.1)
Basophils Relative: 0 %
EOS PCT: 1 %
Eosinophils Absolute: 0.1 10*3/uL (ref 0.0–0.7)
HCT: 37.9 % — ABNORMAL LOW (ref 39.0–52.0)
Hemoglobin: 12.9 g/dL — ABNORMAL LOW (ref 13.0–17.0)
LYMPHS ABS: 2 10*3/uL (ref 0.7–4.0)
LYMPHS PCT: 45 %
MCH: 29.7 pg (ref 26.0–34.0)
MCHC: 34 g/dL (ref 30.0–36.0)
MCV: 87.1 fL (ref 78.0–100.0)
MONOS PCT: 7 %
Monocytes Absolute: 0.3 10*3/uL (ref 0.1–1.0)
Neutro Abs: 2 10*3/uL (ref 1.7–7.7)
Neutrophils Relative %: 47 %
PLATELETS: 125 10*3/uL — AB (ref 150–400)
RBC: 4.35 MIL/uL (ref 4.22–5.81)
RDW: 14.3 % (ref 11.5–15.5)
WBC: 4.3 10*3/uL (ref 4.0–10.5)

## 2016-10-28 LAB — GLUCOSE, CAPILLARY
Glucose-Capillary: 99 mg/dL (ref 65–99)
Glucose-Capillary: 162 mg/dL — ABNORMAL HIGH (ref 65–99)
Glucose-Capillary: 147 mg/dL — ABNORMAL HIGH (ref 65–99)

## 2016-10-28 LAB — LIPID PANEL
CHOLESTEROL: 84 mg/dL (ref 0–200)
HDL: 36 mg/dL — AB (ref >40)
LDL Cholesterol: 42 mg/dL (ref 0–99)
TRIGLYCERIDES: 31 mg/dL (ref <150)
Total CHOL/HDL Ratio: 2.3 RATIO
VLDL: 6 mg/dL (ref 0–40)

## 2016-10-28 LAB — BASIC METABOLIC PANEL
ANION GAP: 6 (ref 5–15)
BUN: 17 mg/dL (ref 6–20)
CHLORIDE: 107 mmol/L (ref 101–111)
CO2: 24 mmol/L (ref 22–32)
CREATININE: 0.95 mg/dL (ref 0.61–1.24)
Calcium: 8.8 mg/dL — ABNORMAL LOW (ref 8.9–10.3)
GFR calc non Af Amer: >60 mL/min (ref >60)
Glucose, Bld: 130 mg/dL — ABNORMAL HIGH (ref 65–99)
Potassium: 3.5 mmol/L (ref 3.5–5.1)
Sodium: 137 mmol/L (ref 135–145)

## 2016-10-28 LAB — MRSA PCR SCREENING: MRSA BY PCR: NEGATIVE

## 2016-10-28 LAB — TROPONIN I: Troponin I: <0.03 ng/mL (ref <0.03)

## 2016-10-28 LAB — POCT ACTIVATED CLOTTING TIME
ACTIVATED CLOTTING TIME: 274 s
ACTIVATED CLOTTING TIME: 274 s

## 2016-10-28 LAB — PROTIME-INR
INR: 1.2
Prothrombin Time: 15.1 seconds (ref 11.4–15.2)

## 2016-10-28 LAB — HEPARIN LEVEL (UNFRACTIONATED): Heparin Unfractionated: 0.45 IU/mL (ref 0.30–0.70)

## 2016-10-28 LAB — CBG MONITORING, ED: Glucose-Capillary: 109 mg/dL — ABNORMAL HIGH (ref 65–99)

## 2016-10-28 SURGERY — LEFT HEART CATH AND CORONARY ANGIOGRAPHY
Anesthesia: LOCAL

## 2016-10-28 MED ORDER — SODIUM CHLORIDE 0.9 % IV SOLN: 250.0000 mL | INTRAVENOUS | Status: DC | PRN

## 2016-10-28 MED ORDER — MIDAZOLAM HCL 2 MG/2ML IJ SOLN
INTRAMUSCULAR | Status: AC
  Filled 2016-10-28: qty 2

## 2016-10-28 MED ORDER — IOPAMIDOL (ISOVUE-370) INJECTION 76%
INTRAVENOUS | Status: AC
  Filled 2016-10-28: qty 100

## 2016-10-28 MED ORDER — HEPARIN (PORCINE) IN NACL 2-0.9 UNIT/ML-% IJ SOLN
INTRAMUSCULAR | Status: DC | PRN
  Administered 2016-10-28: 12:00:00 via INTRA_ARTERIAL

## 2016-10-28 MED ORDER — ADENOSINE 12 MG/4ML IV SOLN
INTRAVENOUS | Status: AC
  Filled 2016-10-28: qty 16

## 2016-10-28 MED ORDER — FENTANYL CITRATE (PF) 100 MCG/2ML IJ SOLN
INTRAMUSCULAR | Status: AC
  Filled 2016-10-28: qty 2

## 2016-10-28 MED ORDER — ASPIRIN 81 MG PO CHEW: 81.0000 mg | CHEWABLE_TABLET | ORAL | Status: DC

## 2016-10-28 MED ORDER — HEPARIN (PORCINE) IN NACL 2-0.9 UNIT/ML-% IJ SOLN
INTRAMUSCULAR | Status: AC | PRN
  Administered 2016-10-28: 1000 mL

## 2016-10-28 MED ORDER — IOPAMIDOL (ISOVUE-370) INJECTION 76%
INTRAVENOUS | Status: DC | PRN
  Administered 2016-10-28: 130 mL via INTRA_ARTERIAL

## 2016-10-28 MED ORDER — MIDAZOLAM HCL 2 MG/2ML IJ SOLN
INTRAMUSCULAR | Status: DC | PRN
  Administered 2016-10-28: 2 mg via INTRAVENOUS
  Administered 2016-10-28: 1 mg via INTRAVENOUS

## 2016-10-28 MED ORDER — FENTANYL CITRATE (PF) 100 MCG/2ML IJ SOLN
INTRAMUSCULAR | Status: DC | PRN
  Administered 2016-10-28: 25 ug via INTRAVENOUS
  Administered 2016-10-28: 50 ug via INTRAVENOUS

## 2016-10-28 MED ORDER — SODIUM CHLORIDE 0.9% FLUSH: 3.0000 mL | Freq: Two times a day (BID) | INTRAVENOUS | Status: DC

## 2016-10-28 MED ORDER — SODIUM CHLORIDE 0.9 % WEIGHT BASED INFUSION: 3.0000 mL/kg/h | INTRAVENOUS | Status: DC

## 2016-10-28 MED ORDER — SODIUM CHLORIDE 0.9 % IV SOLN
INTRAVENOUS | Status: AC
  Administered 2016-10-28: 15:00:00 via INTRAVENOUS

## 2016-10-28 MED ORDER — HEPARIN SODIUM (PORCINE) 1000 UNIT/ML IJ SOLN
INTRAMUSCULAR | Status: DC | PRN
  Administered 2016-10-28: 5000 [IU] via INTRAVENOUS
  Administered 2016-10-28: 4000 [IU] via INTRAVENOUS

## 2016-10-28 MED ORDER — VERAPAMIL HCL 2.5 MG/ML IV SOLN
INTRAVENOUS | Status: AC
  Filled 2016-10-28: qty 2

## 2016-10-28 MED ORDER — NITROGLYCERIN IN D5W 200-5 MCG/ML-% IV SOLN
0.0000 ug/min | INTRAVENOUS | Status: DC
  Administered 2016-10-28: 5 ug/min via INTRAVENOUS
  Administered 2016-10-28: 40 ug/min via INTRAVENOUS
  Administered 2016-10-29: 20 ug/min via INTRAVENOUS
  Filled 2016-10-28 (×2): qty 250

## 2016-10-28 MED ORDER — MORPHINE SULFATE (PF) 4 MG/ML IV SOLN
INTRAVENOUS | Status: AC
  Filled 2016-10-28: qty 1

## 2016-10-28 MED ORDER — HEPARIN SODIUM (PORCINE) 1000 UNIT/ML IJ SOLN
INTRAMUSCULAR | Status: AC
  Filled 2016-10-28: qty 1

## 2016-10-28 MED ORDER — ADENOSINE (DIAGNOSTIC) 140MCG/KG/MIN
INTRAVENOUS | Status: DC | PRN
  Administered 2016-10-28: 140 ug/kg/min via INTRAVENOUS

## 2016-10-28 MED ORDER — SODIUM CHLORIDE 0.9% FLUSH: 3.0000 mL | INTRAVENOUS | Status: DC | PRN

## 2016-10-28 MED ORDER — MORPHINE SULFATE (PF) 2 MG/ML IV SOLN
INTRAVENOUS | Status: DC | PRN
  Administered 2016-10-28: 2 mg via INTRAVENOUS

## 2016-10-28 MED ORDER — HEPARIN (PORCINE) IN NACL 2-0.9 UNIT/ML-% IJ SOLN
INTRAMUSCULAR | Status: AC
  Filled 2016-10-28: qty 1000

## 2016-10-28 MED ORDER — LIDOCAINE HCL (PF) 1 % IJ SOLN
INTRAMUSCULAR | Status: DC | PRN
  Administered 2016-10-28: 5 mL

## 2016-10-28 MED ORDER — LIDOCAINE HCL (PF) 1 % IJ SOLN
INTRAMUSCULAR | Status: AC
  Filled 2016-10-28: qty 30

## 2016-10-28 MED ORDER — SODIUM CHLORIDE 0.9 % WEIGHT BASED INFUSION: 1.0000 mL/kg/h | INTRAVENOUS | Status: DC

## 2016-10-28 SURGICAL SUPPLY — 15 items
CATH INFINITI 5 FR JL3.5 (CATHETERS) ×1 IMPLANT
CATH INFINITI 5FR ANG PIGTAIL (CATHETERS) ×1 IMPLANT
CATH INFINITI JR4 5F (CATHETERS) ×1 IMPLANT
CATH LAUNCHER 6FR EBU3.5 (CATHETERS) ×1 IMPLANT
CATH MICROCATH NAVVUS (MICROCATHETER) IMPLANT
DEVICE RAD COMP TR BAND LRG (VASCULAR PRODUCTS) ×1 IMPLANT
GLIDESHEATH SLEND SS 6F .021 (SHEATH) ×1 IMPLANT
GUIDEWIRE INQWIRE 1.5J.035X260 (WIRE) IMPLANT
INQWIRE 1.5J .035X260CM (WIRE) ×2
KIT HEART LEFT (KITS) ×2 IMPLANT
MICROCATHETER NAVVUS (MICROCATHETER) ×2
PACK CARDIAC CATHETERIZATION (CUSTOM PROCEDURE TRAY) ×2 IMPLANT
TRANSDUCER W/STOPCOCK (MISCELLANEOUS) ×2 IMPLANT
TUBING CIL FLEX 10 FLL-RA (TUBING) ×2 IMPLANT
WIRE ASAHI PROWATER 180CM (WIRE) ×1 IMPLANT

## 2016-10-28 NOTE — ED Notes (Signed)
Pt signed permit for Cardiac cath and angiography.

## 2016-10-28 NOTE — Progress Notes (Signed)
ANTICOAGULATION CONSULT NOTE - Follow Up Consult  Pharmacy Consult for Heparin Indication: chest pain/ACS  No Known Allergies  Patient Measurements: Height: 5\' 9"  (175.3 cm) Weight: 185 lb (83.9 kg) IBW/kg (Calculated) : 70.7  Vital Signs: Temp: 98.1 F (36.7 C) (09/14 0956) Temp Source: Oral (09/14 0956) BP: 129/83 (09/14 0956) Pulse Rate: 48 (09/14 0730)  Labs:  Recent Labs  10/27/16 1821  10/28/16 0126 10/28/16 0454 10/28/16 0911  HGB 15.0  --   --  12.9*  --   HCT 43.2  --   --  37.9*  --   PLT 155  --   --  125*  --   LABPROT  --   --   --  15.1  --   INR  --   --   --  1.20  --   HEPARINUNFRC  --   --   --   --  0.45  CREATININE 1.04  --   --  0.95  --   TROPONINI  --   < > <0.03 <0.03 <0.03  < > = values in this interval not displayed.  Estimated Creatinine Clearance: 73.4 mL/min (by C-G formula based on SCr of 0.95 mg/dL).   Medical History: Past Medical History:  Diagnosis Date  . Arthritis    "lower back; right shoulder; left thumb; joints" (06/21/2013)  . Asthma    "not sure if this is true or not" (06/21/2013)  . CAD (coronary artery disease) May 2015   s/p PTCA/DES x 3 to mid LAD, mod non-ob disease in Cx and RCA May 2015; s/p repeat caths - last study in November of 2015 - stents patent, - FFR - managed medically.   [name] GERD (gastroesophageal reflux disease)   . Gout    "maybe twice in my life"  . Headache(784.0)    "weekly for the last 3-4 months" (06/21/2013)  . Hyperlipidemia   . Hypertension   . Myocardial infarction [facility])    "/Dr. Katharina Caper I've had one between 2014-2015) (06/21/2013)  . Osteoarthritis   . Sleep apnea    WEIGHT LOSS, NO LONGER NEEDS PER PATIENT  . Type II diabetes mellitus ([facility])    TYPE 2    Medications:  No current facility-administered medications on file prior to encounter.    Current Outpatient Prescriptions on File Prior to Encounter  Medication Sig Dispense Refill  . acetaminophen (TYLENOL) 500 MG tablet Take 500 mg by  mouth every 8 (eight) hours as needed (pain).    [name] ALPRAZolam (XANAX) 0.5 MG tablet Take 0.5 mg by mouth daily as needed for anxiety.     [name] amLODipine (NORVASC) 5 MG tablet Take 1 tablet (5 mg total) by mouth 2 (two) times daily. 180 tablet 3  . aspirin 81 MG tablet Take 81 mg by mouth at bedtime.     [name] atenolol (TENORMIN) 50 MG tablet Take 1 tablet (50 mg total) by mouth at bedtime. 90 tablet 3  . atorvastatin (LIPITOR) 80 MG tablet Take 80 mg by mouth daily at 6 PM.    . canagliflozin (INVOKANA) 300 MG TABS tablet Take 300 mg by mouth daily before breakfast.    . Cholecalciferol (VITAMIN D PO) Take 1 capsule by mouth daily.    . clopidogrel (PLAVIX) 75 MG tablet Take 1 tablet (75 mg total) by mouth daily with breakfast. 90 tablet 3  . docusate sodium (COLACE) 100 MG capsule Take 100 mg by mouth daily as needed for mild constipation.    [name]  gabapentin (NEURONTIN) 300 MG capsule Take 300 mg by mouth at bedtime.     . hydrochlorothiazide (MICROZIDE) 12.5 MG capsule Take 1 capsule (12.5 mg total) by mouth every Monday, Wednesday, and Friday. 36 capsule 3  . irbesartan (AVAPRO) 300 MG tablet Take 0.5 tablets (150 mg total) by mouth daily. 90 tablet 3  . isosorbide mononitrate (IMDUR) 30 MG 24 hr tablet TAKE 1 TABLET BY MOUTH TWICE (2) DAILY (Patient taking differently: Take 30 mg by mouth daily. TAKE 1 TABLET BY MOUTH TWICE (2) DAILY) 180 tablet 2  . metFORMIN (GLUCOPHAGE-XR) 500 MG 24 hr tablet Take 2 tablets (1,000 mg total) by mouth 2 (two) times daily.    . nitroGLYCERIN (NITROSTAT) 0.4 MG SL tablet Place 1 tablet (0.4 mg total) under the tongue every 5 (five) minutes as needed. For chest pain 25 tablet 5  . pantoprazole (PROTONIX) 40 MG tablet Take 40 mg by mouth 2 (two) times daily.    . polyethylene glycol (MIRALAX / GLYCOLAX) packet Take 17 g by mouth daily as needed for mild constipation.    . ranolazine (RANEXA) 500 MG 12 hr tablet Take 1 tablet (500 mg total) by mouth 2 (two) times daily. 180  tablet 3  . traMADol (ULTRAM) 50 MG tablet Take 50-100 mg by mouth every 6 (six) hours as needed (pain).       Assessment: 71 y.o. male with chest pain for heparin. HL this AM is therapeutic at 0.45 on 1000 units/hr. H/H and plt trended down.   Goal of Therapy:  Heparin level 0.3-0.7 units/ml Monitor platelets by anticoagulation protocol: Yes   Plan:  Continue heparin 1000 units/hr Check heparin level in 8 hours.  Daily HL, CBC and s/s of bleeding   Albertina Parr, PharmD., BCPS Clinical Pharmacist Pager [phone]

## 2016-10-28 NOTE — ED Notes (Signed)
Admitting md(Cardiology) at bedside.

## 2016-10-28 NOTE — Interval H&P Note (Signed)
History and Physical Interval Note:  10/28/2016 11:18 AM  [name]  has presented today for surgery, with the diagnosis of unstable angina and known CAD  The various methods of treatment have been discussed with the patient and family. After consideration of risks, benefits and other options for treatment, the patient has consented to  Procedure(s): LEFT HEART CATH AND CORONARY ANGIOGRAPHY (N/A) with possible Percutaneous Coronary Intervention as a surgical intervention .  The patient's history has been reviewed, patient examined, no change in status, stable for surgery.  I have reviewed the patient's chart and labs.  Questions were answered to the patient's satisfaction.    Cath Lab Visit (complete for each Cath Lab visit)  Clinical Evaluation Leading to the Procedure:   ACS: Yes.    Non-ACS:    Anginal Classification: CCS IV  Anti-ischemic medical therapy: Maximal Therapy (2 or more classes of medications)  Non-Invasive Test Results: No non-invasive testing performed  Prior CABG: No previous CABG    Glenetta Hew

## 2016-10-28 NOTE — ED Notes (Signed)
Report given to charge RN on [place].

## 2016-10-28 NOTE — H&P (View-Only) (Signed)
Cardiology Admission History and Physical:   Patient ID: Hunter Mcbride.; MRN: [id number]; DOB: Jun 10, 1945   Admission date: 10/27/2016  Primary Care Provider: Crist Infante, MD Primary Cardiologist: Dr. Burt Knack Primary Electrophysiologist:  none  Chief Complaint:  Chest pain   Patient Profile:   Hunter Mcbride. is a 71 y.o. male with a history of extensive CAD who presents to the ER with complaints of chest pain and Cardiology is now asked to evaluate patient.  History of Present Illness:   Hunter Mcbride is a 71yo WM and extensive history of CAD, HTN, HLD, DM and OSA.   He presented back in the spring of 2015 with a 2 month history of exertional chest pain - had abnormal Myoview - then cathed and had DES x 3 to the LAD. Otherwise had moderate but nonobstructive disease With a normal EF of 55 to 60%. After his initial event - he went back to the ER with chest pain and was recathed - to continue with medical management. He has not tolerated Roh active NTG in the past despite several attempts.   In November of 2015 - he went hunting with his son out of state. He had been cutting trees and setting up stands. Developed SSCP that initially resolved but then returned, subsequently associated with SOB and nausea - ended up going to the hospital in [place] (after first presenting to an urgent care like facility that did not offer cardiac cath) - was recathed - actually twice. Had normal left heart pressures, patent stents extending from the proximal to the distal LAD and severe diffuse disease involving the branches of all 3 major epicardial vessels. No clear culprit lesion to intervene on - then had Myoview which showed normal perfusion and normal EF at 59%. Was recathed and had FFR of the first OM, posterior descending and distal RCA which were nonsignificant - no PCI was performed. CXR was normal. Echo was also updated - normal EF - mildly dilated aorta noted with mild LVH.   In the hospital  back in January of 2017 with angina - low risk Myoview noted. Continued on medical management. He has continued to have someepisodes of chest pain. Rechallenged him with Imdur. Has not wanted to increase Ranexa due to cost.  When seenback in June of 2017 - he was not doing well - having more angina - ended up getting him recathed. Medical management continued. On several OV after that complained of CP and tried again to increase his Ranexa but was not able to tolerate - again. Dr. Burt Knack thought that at some point, Jakoby would need to have bypass surgery.    Had recurrent CP and underwent repeat cath 06/07/2016 showing triple vessel CAD with severe stenosis of the PL and severe stenosis of a small to moderate PDA and underwent PTA of the PDA and PCI with DES to the PL.  The mid LAD stent was patent.    He has continued to have upper back burning that he calls his angina along with anterior CP.  At last OV with Dr. Burt Knack he was continuing to have chronic atypical CP that he states had been very difficult to manage and has been seen multiple times for CP.  His last note stated that if he continued to have CP he would have to consider repeat cath with FFR of all major corornary vessels to determine whether he would be a candidate for CABG vs. PCI vs ongoing medical Rx.  Tonight  he comes to the ER with continued complaints of chest pain and burning in his back.  Since yesterday he has had left sided CP at rest that is sharp across his chest with radiation down his arm. He has had tingling and pin prick sensations in his arm as well.  He has had some very sharp pains in his chest that are new and not like his typical angina although his back pain is very similar to prior angina.   He took 3 SL NTG with some relief for a few minutes but then reocurred.  His sx were associated with N/lightheadedness and fatigue.  This am he was working out in the yard and states that his symptoms returned every 15 minutes and  would take another NTG and took a total of 5 SL NTG.  He says his symptoms are more intense than usual in his back.  In ER he was given MSO4 that significantly improved his pain.  EKG shows no acute changes and Trop is negative x 2.  NTG helps mildly and only transiently but MSO4 seems to help well.      Past Medical History:  Diagnosis Date  . Arthritis    "lower back; right shoulder; left thumb; joints" (06/21/2013)  . Asthma    "not sure if this is true or not" (06/21/2013)  . CAD (coronary artery disease) May 2015   s/p PTCA/DES x 3 to mid LAD, mod non-ob disease in Cx and RCA May 2015; s/p repeat caths - last study in November of 2015 - stents patent, - FFR - managed medically.   [name] GERD (gastroesophageal reflux disease)   . Gout    "maybe twice in my life"  . Headache(784.0)    "weekly for the last 3-4 months" (06/21/2013)  . Hyperlipidemia   . Hypertension   . Myocardial infarction [facility])    "/Dr. Katharina Caper I've had one between 2014-2015) (06/21/2013)  . Osteoarthritis   . Sleep apnea    WEIGHT LOSS, NO LONGER NEEDS PER PATIENT  . Type II diabetes mellitus ([place])    TYPE 2    Past Surgical History:  Procedure Laterality Date  . CARDIAC CATHETERIZATION  1980's   "once"  . CARDIAC CATHETERIZATION N/A 07/23/2015   Procedure: Left Heart Cath and Coronary Angiography;  Surgeon: Sherren Mocha, MD;  Location: [place] [facility];  Service: Cardiovascular;  Laterality: N/A;  . CARDIOVASCULAR STRESS TEST  06/10/2008   EF 68%  . CARPAL TUNNEL RELEASE Bilateral   . CORONARY ANGIOPLASTY WITH STENT PLACEMENT  06/21/2013   "3"  . CORONARY STENT INTERVENTION N/A 06/07/2016   Procedure: Coronary Stent Intervention;  Surgeon: Burnell Blanks, MD;  Location: [place] [facility];  Service: Cardiovascular;  Laterality: N/A;  . EXCISION MORTON'S NEUROMA Left   . EYE SURGERY Left    "removed film over my eye"  . FRACTIONAL FLOW RESERVE WIRE N/A 06/26/2013   Procedure: FRACTIONAL FLOW RESERVE WIRE;   Surgeon: [name], MD;  Location: [place] CATH [facility];  Service: Cardiovascular;  Laterality: N/A;  . HERNIA REPAIR Left   . JOINT REPLACEMENT Left 03/2013   "thumb"  . LEFT HEART CATH AND CORONARY ANGIOGRAPHY N/A 06/07/2016   Procedure: Left Heart Cath and Coronary Angiography;  Surgeon: Burnell Blanks, MD;  Location: [place] [facility];  Service: Cardiovascular;  Laterality: N/A;  . LEFT HEART CATHETERIZATION WITH CORONARY ANGIOGRAM N/A 06/21/2013   Procedure: LEFT HEART CATHETERIZATION WITH CORONARY ANGIOGRAM;  Surgeon: Annita Brod  Angelena Form, MD;  Location: [place] [facility];  Service: Cardiovascular;  Laterality: N/A;  . LEFT HEART CATHETERIZATION WITH CORONARY ANGIOGRAM N/A 06/26/2013   Procedure: LEFT HEART CATHETERIZATION WITH CORONARY ANGIOGRAM;  Surgeon: [name], MD;  Location: [place] [facility];  Service: Cardiovascular;  Laterality: N/A;  . TONSILLECTOMY    . TOTAL HIP ARTHROPLASTY Right 04/12/2016   Procedure: RIGHT TOTAL HIP ARTHROPLASTY ANTERIOR APPROACH;  Surgeon: Paralee Cancel, MD;  Location: [facility];  Service: Orthopedics;  Laterality: Right;  requests 70 mins  . TOTAL SHOULDER ARTHROPLASTY  03/01/2012   Procedure: TOTAL SHOULDER ARTHROPLASTY;  Surgeon: Marin Shutter, MD;  Location: [place];  Service: Orthopedics;  Laterality: Right;  . UMBILICAL HERNIA REPAIR       Medications Prior to Admission: Prior to Admission medications   Medication Sig Start Date End Date Taking? Authorizing Provider  acetaminophen (TYLENOL) 500 MG tablet Take 500 mg by mouth every 8 (eight) hours as needed (pain).   Yes [provider]  ALPRAZolam Duanne Moron) 0.5 MG tablet Take 0.5 mg by mouth daily as needed for anxiety.    Yes [provider]  amLODipine (NORVASC) 5 MG tablet Take 1 tablet (5 mg total) by mouth 2 (two) times daily. 06/15/16  Yes Burtis Junes, NP  aspirin 81 MG tablet Take 81 mg by mouth at bedtime.    Yes [provider]  atenolol (TENORMIN) 50 MG tablet Take  1 tablet (50 mg total) by mouth at bedtime. 06/15/16  Yes Burtis Junes, NP  atorvastatin (LIPITOR) 80 MG tablet Take 80 mg by mouth daily at 6 PM.   Yes [provider]  canagliflozin (INVOKANA) 300 MG TABS tablet Take 300 mg by mouth daily before breakfast.   Yes [provider]  Cholecalciferol (VITAMIN D PO) Take 1 capsule by mouth daily.   Yes [provider]  clopidogrel (PLAVIX) 75 MG tablet Take 1 tablet (75 mg total) by mouth daily with breakfast. 06/15/16  Yes Burtis Junes, NP  docusate sodium (COLACE) 100 MG capsule Take 100 mg by mouth daily as needed for mild constipation.   Yes [provider]  gabapentin (NEURONTIN) 300 MG capsule Take 300 mg by mouth at bedtime.    Yes [provider]  hydrochlorothiazide (MICROZIDE) 12.5 MG capsule Take 1 capsule (12.5 mg total) by mouth every Monday, Wednesday, and Friday. 06/15/16  Yes Burtis Junes, NP  irbesartan (AVAPRO) 300 MG tablet Take 0.5 tablets (150 mg total) by mouth daily. 02/25/15  Yes Barton Dubois, MD  isosorbide mononitrate (IMDUR) 30 MG 24 hr tablet TAKE 1 TABLET BY MOUTH TWICE (2) DAILY Patient taking differently: Take 30 mg by mouth daily. TAKE 1 TABLET BY MOUTH TWICE (2) DAILY 05/31/16  Yes Burtis Junes, NP  metFORMIN (GLUCOPHAGE-XR) 500 MG 24 hr tablet Take 2 tablets (1,000 mg total) by mouth 2 (two) times daily. 06/08/16  Yes Daune Perch, NP  nitroGLYCERIN (NITROSTAT) 0.4 MG SL tablet Place 1 tablet (0.4 mg total) under the tongue every 5 (five) minutes as needed. For chest pain 06/15/16  Yes Burtis Junes, NP  pantoprazole (PROTONIX) 40 MG tablet Take 40 mg by mouth 2 (two) times daily.   Yes [provider]  polyethylene glycol (MIRALAX / GLYCOLAX) packet Take 17 g by mouth daily as needed for mild constipation.   Yes [provider]  ranolazine (RANEXA) 500 MG 12 hr tablet Take 1 tablet (500 mg total) by mouth 2 (two) times  daily. 07/01/16  Yes Nahser,  Wonda Cheng, MD  traMADol (ULTRAM) 50 MG tablet Take 50-100 mg by mouth every 6 (six) hours as needed (pain).   Yes [provider]     Allergies:   No Known Allergies  Social History:   Social History   Social History  . Marital status: Married    Spouse name: N/A  . Number of children: N/A  . Years of education: N/A   Occupational History  . Not on file.   Social History Main Topics  . Smoking status: Never Smoker  . Smokeless tobacco: Never Used  . Alcohol use No  . Drug use: No  . Sexual activity: Yes   Other Topics Concern  . Not on file   Social History Narrative  . No narrative on file    Family History:   The patient's family history includes Coronary artery disease in his cousin; Diabetes type II in his mother; Heart attack in his father; Heart disease in his brother.    ROS:  Please see the history of present illness.  All other ROS reviewed and negative.     Physical Exam/Data:   Vitals:   10/27/16 2130 10/27/16 2230 10/27/16 2250 10/27/16 2315  BP: 130/70  (!) 146/80 (!) 156/78  Pulse: (!) 52 (!) 49 (!) 54 (!) 50  Resp: 14 14 17 14   Temp:      TempSrc:      SpO2: 99% 97% 98% 100%  Weight:      Height:       No intake or output data in the 24 hours ending 10/28/16 0014 Filed Weights   10/27/16 1819  Weight: 181 lb (82.1 kg)   Body mass index is 26.73 kg/m.  General:  Well nourished, well developed, in no acute distress HEENT: normal Lymph: no adenopathy Neck: no JVD Endocrine:  No thryomegaly Vascular: No carotid bruits; FA pulses 2+ bilaterally without bruits  Cardiac:  normal S1, S2; RRR; no murmur  Lungs:  clear to auscultation bilaterally, no wheezing, rhonchi or rales  Abd: soft, nontender, no hepatomegaly  Ext: no edema Musculoskeletal:  No deformities, BUE and BLE strength normal and equal Skin: warm and dry  Neuro:  CNs 2-12 intact, no focal abnormalities noted Psych:  Normal affect    EKG:  The ECG that was done today  was personally reviewed and demonstrates NSR with no St changes  Relevant CV Studies: Cath 05/2016 Conclusion     RPDA lesion, 75 %stenosed.  Ost LM lesion, 40 %stenosed.  1st Mrg lesion, 60 %stenosed.  Prox LAD to Dist LAD lesion, 10 %stenosed.  Dist LAD lesion, 40 %stenosed.  2nd Diag lesion, 80 %stenosed.  1st Diag lesion, 80 %stenosed.  Dist RCA lesion, 90 %stenosed.  Ost RPDA to RPDA lesion, 95 %stenosed.  Mid RCA lesion, 40 %stenosed.  Prox RCA to Mid RCA lesion, 40 %stenosed.  Mid Cx lesion, 60 %stenosed.  The left ventricular systolic function is normal.  LV end diastolic pressure is normal.  The left ventricular ejection fraction is 55-65% by visual estimate.  There is no mitral valve regurgitation.   1. Triple vessel CAD 2. Severe stenosis ostium of the moderate caliber posterolateral branch. Successful PTCA/DES x 1 proximal posterolateral artery.  3. Severe stenosis small to moderate caliber PDA. Successful PTCA with balloon angioplasty only proximal PDA.  4. Patent stent mid LAD.  5. Diffuse disease in the small caliber diagonal branches.  6. Normal LV systolic function 7.  Mild left main stenosis.   Recommendations: Continue DAPT for at least one year. Continue statin, beta blocker, Norvasc and Ranexa.       Laboratory Data:  Chemistry Recent Labs Lab 10/27/16 1821  NA 137  K 4.3  CL 105  CO2 24  GLUCOSE 262*  BUN 20  CREATININE 1.04  CALCIUM 9.9  GFRNONAA >60  GFRAA >60  ANIONGAP 8    No results for input(s): PROT, ALBUMIN, AST, ALT, ALKPHOS, BILITOT in the last 168 hours. Hematology Recent Labs Lab 10/27/16 1821  WBC 5.3  RBC 4.90  HGB 15.0  HCT 43.2  MCV 88.2  MCH 30.6  MCHC 34.7  RDW 14.4  PLT 155   Cardiac Enzymes Recent Labs Lab 10/27/16 2248  TROPONINI <0.03    Recent Labs Lab 10/27/16 1834  TROPIPOC 0.00    BNPNo results for input(s): BNP, PROBNP in the last 168 hours.  DDimer No results for  input(s): DDIMER in the last 168 hours.  Radiology/Studies:  Dg Chest 2 View  Result Date: 10/27/2016 CLINICAL DATA:  chest pain. Pt has had cp since yesterday. He said it happens off and on a lot but, has been bad since yesterday. He feels pain in his left side chest from left nipple to center of chest. He said it will also hurt straight through into his back as well. Pt mentioned usually he'll feel the pain start in his back first, then into his chest. Pt explained he has experienced a lot of trouble with his heart in the past. Hx of CAD, HTN, MI, DM2, pt has had heart numerous heart catheterizations and stents EXAM: CHEST  2 VIEW COMPARISON:  07/08/2016 FINDINGS: Cardiac silhouette is normal in size and configuration. No mediastinal or hilar masses. No evidence of adenopathy. Clear lungs. No pleural effusion or pneumothorax. Right shoulder prosthesis, partly imaged appears well-seated and aligned. IMPRESSION: 1. No active cardiopulmonary disease. Electronically Signed   By: Lajean Manes M.D.   On: 10/27/2016 19:50    Assessment and Plan:   1.  Chest pain - he has some symptoms that are consistent with his typical angina mainly in his back but the sharp pain in his chest is new.  He also is having some tingling and pin prick sensations in his left arm.  EKG is nonischemic.  Trop neg x 2.  His pain is improved more with MSO4 than with NTG.   - continue to cycle trop - he is being [facility] - IV heparin gtt until rule out complete - IV NTG gtt for CP - can use PRN MSO4 for severe pain - continue ASA/Plavix/statin/Ranexa/BB - NPO after MN - recommend repeat cath in am as outlined in Dr. Antionette Char note with FFR of all main coronary arteries to try to define reduced flow and make determination of whether he needs CABG vs. PCI vs. Ongoing medical management.   2.  HTN - BP borderline controlled - continue BB - cannot titrate further due to bradycardia - hold diuretic for cath  3.   Hyperlipidemia - continue high dose statin  Severity of Illness: The appropriate patient status for this patient is OBSERVATION. Observation status is judged to be reasonable and necessary in order to provide the required intensity of service to ensure the patient's safety. The patient's presenting symptoms, physical exam findings, and initial radiographic and laboratory data in the context of their medical condition is felt to place them at decreased risk for further clinical deterioration. Furthermore,  it is anticipated that the patient will be medically stable for discharge from the hospital within 2 midnights of admission. The following factors support the patient status of observation.   " The patient's presenting symptoms include chest pain. " The physical exam findings include none. " The initial radiographic and laboratory data are normal.     For questions or updates, please contact [place] Please consult [email or web address] for contact info under Cardiology/STEMI.    Signed, Fransico Him, MD  10/28/2016 12:14 AM

## 2016-10-28 NOTE — Progress Notes (Signed)
Pt admitted to 2c05 from ED. Pt oriented to room. VSS. Call bell with in reach.

## 2016-10-28 NOTE — Progress Notes (Signed)
Inpatient Diabetes Program Recommendations  AACE/ADA: New Consensus Statement on Inpatient Glycemic Control (2015)  Target Ranges:  Prepandial:   less than 140 mg/dL      Peak postprandial:   less than 180 mg/dL (1-2 hours)      Critically ill patients:  140 - 180 mg/dL   Lab Results  Component Value Date   GLUCAP 109 (H) 10/28/2016   HGBA1C 7.1 (H) 06/06/2016    Review of Glycemic Control  Results for Vierra, ROYALE SWAMY (MRN [id number]) as of 10/28/2016 10:55  Ref. Range 10/27/2016 18:48 10/28/2016 07:06  Glucose-Capillary Latest Ref Range: 65 - 99 mg/dL 223 (H) 109 (H)    Diabetes history: Type 2 Outpatient Diabetes medications: Invokana 300mg  qam, Metformin 1000mg  bid Current orders for Inpatient glycemic control: Novolog 0-15 units tid, Novolog 0-5 units qhs,   Inpatient Diabetes Program Recommendations:   Agree with current medications for blood sugar management.    Gentry Fitz, RN, BA, MHA, CDE Diabetes Coordinator Inpatient Diabetes Program  [phone] (Team Pager) [phone] ([place]) 10/28/2016 11:03 AM

## 2016-10-28 NOTE — Progress Notes (Signed)
Patient admitted few hours ago for unstable angina, on heparin drip going straight to cath lab. No other major medical problems. Case discussed with Dr. Radford Pax will assume the care of the patient and she will be the primary attending on this patient, kindly call hospitalist service for any medical questions.

## 2016-10-28 NOTE — ED Notes (Signed)
Cath lab notified with inquiry regarding plavix and ASA administration-- ok to give per cath lab.

## 2016-10-28 NOTE — Consult Note (Addendum)
Cardiology Admission History and Physical:   Patient ID: Hunter Mcbride.; MRN: [id number]; DOB: Nov 19, 1945   Admission date: 10/27/2016  Primary Care Provider: Crist Infante, MD Primary Cardiologist: Dr. Burt Knack Primary Electrophysiologist:  none  Chief Complaint:  Chest pain   Patient Profile:   Hunter Mcbride. is a 71 y.o. male with a history of extensive CAD who presents to the ER with complaints of chest pain and Cardiology is now asked to evaluate patient at the request of Fuller Plan, MD  History of Present Illness:   Hunter Mcbride is a 71yo WM and extensive history of CAD, HTN, HLD, DM and OSA.   He presented back in the spring of 2015 with a 2 month history of exertional chest pain - had abnormal Myoview - then cathed and had DES x 3 to the LAD. Otherwise had moderate but nonobstructive disease With a normal EF of 55 to 60%. After his initial event - he went back to the ER with chest pain and was recathed - to continue with medical management. He has not tolerated [place] active NTG in the past despite several attempts.   In November of 2015 - he went hunting with his son out of state. He had been cutting trees and setting up stands. Developed SSCP that initially resolved but then returned, subsequently associated with SOB and nausea - ended up going to the hospital in [place] (after first presenting to an urgent care like facility that did not offer cardiac cath) - was recathed - actually twice. Had normal left heart pressures, patent stents extending from the proximal to the distal LAD and severe diffuse disease involving the branches of all 3 major epicardial vessels. No clear culprit lesion to intervene on - then had Myoview which showed normal perfusion and normal EF at 59%. Was recathed and had FFR of the first OM, posterior descending and distal RCA which were nonsignificant - no PCI was performed. CXR was normal. Echo was also updated - normal EF - mildly dilated aorta noted  with mild LVH.   In the hospital back in January of 2017 with angina - low risk Myoview noted. Continued on medical management. He has continued to have someepisodes of chest pain. Rechallenged him with Imdur. Has not wanted to increase Ranexa due to cost.  When seenback in June of 2017 - he was not doing well - having more angina - ended up getting him recathed. Medical management continued. On several OV after that complained of CP and tried again to increase his Ranexa but was not able to tolerate - again. Dr. Burt Knack thought that at some point, Hunter Mcbride would need to have bypass surgery.    Had recurrent CP and underwent repeat cath 06/07/2016 showing triple vessel CAD with severe stenosis of the PL and severe stenosis of a small to moderate PDA and underwent PTA of the PDA and PCI with DES to the PL.  The mid LAD stent was patent.    He has continued to have upper back burning that he calls his angina along with anterior CP.  At last OV with Dr. Burt Knack he was continuing to have chronic atypical CP that he states had been very difficult to manage and has been seen multiple times for CP.  His last note stated that if he continued to have CP he would have to consider repeat cath with FFR of all major corornary vessels to determine whether he would be a candidate for CABG vs.  PCI vs ongoing medical Rx.  Tonight he comes to the ER with continued complaints of chest pain and burning in his back.  Since yesterday he has had left sided CP at rest that is sharp across his chest with radiation down his arm. He has had tingling and pin prick sensations in his arm as well.  He has had some very sharp pains in his chest that are new and not like his typical angina although his back pain is very similar to prior angina.   He took 3 SL NTG with some relief for a few minutes but then reocurred.  His sx were associated with N/lightheadedness and fatigue.  This am he was working out in the yard and states that his  symptoms returned every 15 minutes and would take another NTG and took a total of 5 SL NTG.  He says his symptoms are more intense than usual in his back.  In ER he was given MSO4 that significantly improved his pain.  EKG shows no acute changes and Trop is negative x 2.  NTG helps mildly and only transiently but MSO4 seems to help well.      Past Medical History:  Diagnosis Date  . Arthritis    "lower back; right shoulder; left thumb; joints" (06/21/2013)  . Asthma    "not sure if this is true or not" (06/21/2013)  . CAD (coronary artery disease) May 2015   s/p PTCA/DES x 3 to mid LAD, mod non-ob disease in Cx and RCA May 2015; s/p repeat caths - last study in November of 2015 - stents patent, - FFR - managed medically.   [name] GERD (gastroesophageal reflux disease)   . Gout    "maybe twice in my life"  . Headache(784.0)    "weekly for the last 3-4 months" (06/21/2013)  . Hyperlipidemia   . Hypertension   . Myocardial infarction [facility])    "/Dr. Katharina Caper I've had one between 2014-2015) (06/21/2013)  . Osteoarthritis   . Sleep apnea    WEIGHT LOSS, NO LONGER NEEDS PER PATIENT  . Type II diabetes mellitus ([place])    TYPE 2    Past Surgical History:  Procedure Laterality Date  . CARDIAC CATHETERIZATION  1980's   "once"  . CARDIAC CATHETERIZATION N/A 07/23/2015   Procedure: Left Heart Cath and Coronary Angiography;  Surgeon: Sherren Mocha, MD;  Location: [facility] CV LAB;  Service: Cardiovascular;  Laterality: N/A;  . CARDIOVASCULAR STRESS TEST  06/10/2008   EF 68%  . CARPAL TUNNEL RELEASE Bilateral   . CORONARY ANGIOPLASTY WITH STENT PLACEMENT  06/21/2013   "3"  . CORONARY STENT INTERVENTION N/A 06/07/2016   Procedure: Coronary Stent Intervention;  Surgeon: Burnell Blanks, MD;  Location: [facility] CV LAB;  Service: Cardiovascular;  Laterality: N/A;  . EXCISION MORTON'S NEUROMA Left   . EYE SURGERY Left    "removed film over my eye"  . FRACTIONAL FLOW RESERVE WIRE N/A 06/26/2013    Procedure: FRACTIONAL FLOW RESERVE WIRE;  Surgeon: [name], MD;  Location: [facility] CATH LAB;  Service: Cardiovascular;  Laterality: N/A;  . HERNIA REPAIR Left   . JOINT REPLACEMENT Left 03/2013   "thumb"  . LEFT HEART CATH AND CORONARY ANGIOGRAPHY N/A 06/07/2016   Procedure: Left Heart Cath and Coronary Angiography;  Surgeon: Burnell Blanks, MD;  Location: [facility] CV LAB;  Service: Cardiovascular;  Laterality: N/A;  . LEFT HEART CATHETERIZATION WITH CORONARY ANGIOGRAM N/A 06/21/2013   Procedure: LEFT HEART CATHETERIZATION  WITH CORONARY ANGIOGRAM;  Surgeon: Burnell Blanks, MD;  Location: [facility] CATH LAB;  Service: Cardiovascular;  Laterality: N/A;  . LEFT HEART CATHETERIZATION WITH CORONARY ANGIOGRAM N/A 06/26/2013   Procedure: LEFT HEART CATHETERIZATION WITH CORONARY ANGIOGRAM;  Surgeon: [name], MD;  Location: [facility] CATH LAB;  Service: Cardiovascular;  Laterality: N/A;  . TONSILLECTOMY    . TOTAL HIP ARTHROPLASTY Right 04/12/2016   Procedure: RIGHT TOTAL HIP ARTHROPLASTY ANTERIOR APPROACH;  Surgeon: Paralee Cancel, MD;  Location: [facility];  Service: Orthopedics;  Laterality: Right;  requests 70 mins  . TOTAL SHOULDER ARTHROPLASTY  03/01/2012   Procedure: TOTAL SHOULDER ARTHROPLASTY;  Surgeon: Marin Shutter, MD;  Location: [facility];  Service: Orthopedics;  Laterality: Right;  . UMBILICAL HERNIA REPAIR       Medications Prior to Admission: Prior to Admission medications   Medication Sig Start Date End Date Taking? Authorizing Provider  acetaminophen (TYLENOL) 500 MG tablet Take 500 mg by mouth every 8 (eight) hours as needed (pain).   Yes [provider]  ALPRAZolam Duanne Moron) 0.5 MG tablet Take 0.5 mg by mouth daily as needed for anxiety.    Yes [provider]  amLODipine (NORVASC) 5 MG tablet Take 1 tablet (5 mg total) by mouth 2 (two) times daily. 06/15/16  Yes Burtis Junes, NP  aspirin 81 MG tablet Take 81 mg by mouth at bedtime.    Yes [provider]  atenolol (TENORMIN) 50 MG tablet Take 1 tablet (50 mg total) by mouth at bedtime. 06/15/16  Yes Burtis Junes, NP  atorvastatin (LIPITOR) 80 MG tablet Take 80 mg by mouth daily at 6 PM.   Yes [provider]  canagliflozin (INVOKANA) 300 MG TABS tablet Take 300 mg by mouth daily before breakfast.   Yes [provider]  Cholecalciferol (VITAMIN D PO) Take 1 capsule by mouth daily.   Yes [provider]  clopidogrel (PLAVIX) 75 MG tablet Take 1 tablet (75 mg total) by mouth daily with breakfast. 06/15/16  Yes Burtis Junes, NP  docusate sodium (COLACE) 100 MG capsule Take 100 mg by mouth daily as needed for mild constipation.   Yes [provider]  gabapentin (NEURONTIN) 300 MG capsule Take 300 mg by mouth at bedtime.    Yes [provider]  hydrochlorothiazide (MICROZIDE) 12.5 MG capsule Take 1 capsule (12.5 mg total) by mouth every Monday, Wednesday, and Friday. 06/15/16  Yes Burtis Junes, NP  irbesartan (AVAPRO) 300 MG tablet Take 0.5 tablets (150 mg total) by mouth daily. 02/25/15  Yes Barton Dubois, MD  isosorbide mononitrate (IMDUR) 30 MG 24 hr tablet TAKE 1 TABLET BY MOUTH TWICE (2) DAILY Patient taking differently: Take 30 mg by mouth daily. TAKE 1 TABLET BY MOUTH TWICE (2) DAILY 05/31/16  Yes Burtis Junes, NP  metFORMIN (GLUCOPHAGE-XR) 500 MG 24 hr tablet Take 2 tablets (1,000 mg total) by mouth 2 (two) times daily. 06/08/16  Yes Daune Perch, NP  nitroGLYCERIN (NITROSTAT) 0.4 MG SL tablet Place 1 tablet (0.4 mg total) under the tongue every 5 (five) minutes as needed. For chest pain 06/15/16  Yes Burtis Junes, NP  pantoprazole (PROTONIX) 40 MG tablet Take 40 mg by mouth 2 (two) times daily.   Yes [provider]  polyethylene glycol (MIRALAX / GLYCOLAX) packet Take 17 g by mouth daily as needed for mild constipation.   Yes [provider]  ranolazine (RANEXA) 500 MG 12 hr tablet Take 1 tablet (500  mg total) by mouth  2 (two) times daily. 07/01/16  Yes Nahser, Wonda Cheng, MD  traMADol (ULTRAM) 50 MG tablet Take 50-100 mg by mouth every 6 (six) hours as needed (pain).   Yes [provider]     Allergies:   No Known Allergies  Social History:   Social History   Social History  . Marital status: Married    Spouse name: N/A  . Number of children: N/A  . Years of education: N/A   Occupational History  . Not on file.   Social History Main Topics  . Smoking status: Never Smoker  . Smokeless tobacco: Never Used  . Alcohol use No  . Drug use: No  . Sexual activity: Yes   Other Topics Concern  . Not on file   Social History Narrative  . No narrative on file    Family History:   The patient's family history includes Coronary artery disease in his cousin; Diabetes type II in his mother; Heart attack in his father; Heart disease in his brother.    ROS:  Please see the history of present illness.  All other ROS reviewed and negative.     Physical Exam/Data:   Vitals:   10/27/16 2130 10/27/16 2230 10/27/16 2250 10/27/16 2315  BP: 130/70  (!) 146/80 (!) 156/78  Pulse: (!) 52 (!) 49 (!) 54 (!) 50  Resp: 14 14 17 14   Temp:      TempSrc:      SpO2: 99% 97% 98% 100%  Weight:      Height:       No intake or output data in the 24 hours ending 10/28/16 0014 Filed Weights   10/27/16 1819  Weight: 181 lb (82.1 kg)   Body mass index is 26.73 kg/m.  General:  Well nourished, well developed, in no acute distress HEENT: normal Lymph: no adenopathy Neck: no JVD Endocrine:  No thryomegaly Vascular: No carotid bruits; FA pulses 2+ bilaterally without bruits  Cardiac:  normal S1, S2; RRR; no murmur  Lungs:  clear to auscultation bilaterally, no wheezing, rhonchi or rales  Abd: soft, nontender, no hepatomegaly  Ext: no edema Musculoskeletal:  No deformities, BUE and BLE strength normal and equal Skin: warm and dry  Neuro:  CNs 2-12 intact, no focal abnormalities noted Psych:  Normal  affect    EKG:  The ECG that was done today was personally reviewed and demonstrates NSR with no St changes  Relevant CV Studies: Cath 05/2016 Conclusion     RPDA lesion, 75 %stenosed.  Ost LM lesion, 40 %stenosed.  1st Mrg lesion, 60 %stenosed.  Prox LAD to Dist LAD lesion, 10 %stenosed.  Dist LAD lesion, 40 %stenosed.  2nd Diag lesion, 80 %stenosed.  1st Diag lesion, 80 %stenosed.  Dist RCA lesion, 90 %stenosed.  Ost RPDA to RPDA lesion, 95 %stenosed.  Mid RCA lesion, 40 %stenosed.  Prox RCA to Mid RCA lesion, 40 %stenosed.  Mid Cx lesion, 60 %stenosed.  The left ventricular systolic function is normal.  LV end diastolic pressure is normal.  The left ventricular ejection fraction is 55-65% by visual estimate.  There is no mitral valve regurgitation.   1. Triple vessel CAD 2. Severe stenosis ostium of the moderate caliber posterolateral branch. Successful PTCA/DES x 1 proximal posterolateral artery.  3. Severe stenosis small to moderate caliber PDA. Successful PTCA with balloon angioplasty only proximal PDA.  4. Patent stent mid LAD.  5. Diffuse disease in the small caliber diagonal branches.  6. Normal LV systolic function 7. Mild left main stenosis.   Recommendations: Continue DAPT for at least one year. Continue statin, beta blocker, Norvasc and Ranexa.       Laboratory Data:  Chemistry Recent Labs Lab 10/27/16 1821  NA 137  K 4.3  CL 105  CO2 24  GLUCOSE 262*  BUN 20  CREATININE 1.04  CALCIUM 9.9  GFRNONAA >60  GFRAA >60  ANIONGAP 8    No results for input(s): PROT, ALBUMIN, AST, ALT, ALKPHOS, BILITOT in the last 168 hours. Hematology Recent Labs Lab 10/27/16 1821  WBC 5.3  RBC 4.90  HGB 15.0  HCT 43.2  MCV 88.2  MCH 30.6  MCHC 34.7  RDW 14.4  PLT 155   Cardiac Enzymes Recent Labs Lab 10/27/16 2248  TROPONINI <0.03    Recent Labs Lab 10/27/16 1834  TROPIPOC 0.00    BNPNo results for input(s): BNP, PROBNP in the  last 168 hours.  DDimer No results for input(s): DDIMER in the last 168 hours.  Radiology/Studies:  Dg Chest 2 View  Result Date: 10/27/2016 CLINICAL DATA:  chest pain. Pt has had cp since yesterday. He said it happens off and on a lot but, has been bad since yesterday. He feels pain in his left side chest from left nipple to center of chest. He said it will also hurt straight through into his back as well. Pt mentioned usually he'll feel the pain start in his back first, then into his chest. Pt explained he has experienced a lot of trouble with his heart in the past. Hx of CAD, HTN, MI, DM2, pt has had heart numerous heart catheterizations and stents EXAM: CHEST  2 VIEW COMPARISON:  07/08/2016 FINDINGS: Cardiac silhouette is normal in size and configuration. No mediastinal or hilar masses. No evidence of adenopathy. Clear lungs. No pleural effusion or pneumothorax. Right shoulder prosthesis, partly imaged appears well-seated and aligned. IMPRESSION: 1. No active cardiopulmonary disease. Electronically Signed   By: Lajean Manes M.D.   On: 10/27/2016 19:50    Assessment and Plan:   1.  Chest pain - he has some symptoms that are consistent with his typical angina mainly in his back but the sharp pain in his chest is new.  He also is having some tingling and pin prick sensations in his left arm.  EKG is nonischemic.  Trop neg x 2.  His pain is improved more with MSO4 than with NTG.   - continue to cycle trop - he is being admitted to [facility] - IV heparin gtt until rule out complete - IV NTG gtt for CP - can use PRN MSO4 for severe pain - continue ASA/Plavix/statin/Ranexa/BB - NPO after MN - recommend repeat cath in am as outlined in Dr. Antionette Char note with FFR of all main coronary arteries to try to define reduced flow and make determination of whether he needs CABG vs. PCI vs. Ongoing medical management.   2.  HTN - BP borderline controlled - continue BB - cannot titrate further due to bradycardia -  hold diuretic for cath  3.  Hyperlipidemia - continue high dose statin  Severity of Illness: The appropriate patient status for this patient is OBSERVATION. Observation status is judged to be reasonable and necessary in order to provide the required intensity of service to ensure the patient's safety. The patient's presenting symptoms, physical exam findings, and initial radiographic and laboratory data in the context of their medical condition is felt to place them at decreased  risk for further clinical deterioration. Furthermore, it is anticipated that the patient will be medically stable for discharge from the hospital within 2 midnights of admission. The following factors support the patient status of observation.   " The patient's presenting symptoms include chest pain. " The physical exam findings include none. " The initial radiographic and laboratory data are normal.     For questions or updates, please contact [place] Please consult [email or web address] for contact info under Cardiology/STEMI.    Signed, Fransico Him, MD  10/28/2016 12:14 AM

## 2016-10-28 NOTE — Progress Notes (Signed)
Patient ID: Hunter Mcbride., male   DOB: 1945-11-22, 71 y.o.   MRN: [id number]      [address]       [place] [address]             [place] #[id number] Date of Birth: [birth year]  Referring:Dr Ellyn Hack  Primary Cardiology: Dr Burt Knack  Primary Care: Crist Infante, MD  Chief Complaint:    Chief Complaint  Patient presents with  . Chest Pain    History of Present Illness:     Patient admitted with unstable angina pain two days ago. He has Feider history of recurrent chest pian and previous stent and angioplasties.  In the  spring of 2015 with  history of exertional chest pain he - had abnormal Myoview -At  cathed  DES x 3 to the LAD was done . Otherwise had moderate but nonobstructive disease With a normal EF of 55 to 60%.  In November of 2015 - while  h[place] of state.  He Developed SSCP that initially resolved but then returned, subsequently associated with SOB and nausea - ended up going to the hospital in [place] , repeat cath done x2 . Had normal left heart pressures, patent stents extending from the proximal to the distal LAD and severe diffuse disease involving the branches of all 3 major epicardial vessels. Was recathed and had FFR of the first OM, posterior descending and distal RCA which were nonsignificant - no PCI was performed. CXR was normal. Echo was also updated - normal EF - mildly dilated aorta noted with mild LVH.   January of 2017 with angina - low risk Myoview noted. Continued on medical management. He has continued to have someepisodes of chest pain.  June of 2017 - he was not doing well - having more angina - ended up getting him recathed. Medical management continued.    Had recurrent CP and underwent repeat cath 06/07/2016 showing triple vessel CAD with severe stenosis of the PL and severe stenosis of a small to moderate PDA and underwent PTA of the PDA and PCI with DES to the PL.  The mid  LAD stent was patent.    He has continued to have upper back burning that he calls his angina along with anterior CP.   Dr. Burt Knack noted  continuing to have chronic atypical CP that he states had been very difficult to manage and has been seen multiple times for CP.   This admission  he comes to the ER with continued complaints of chest pain and burning in his back.    He took 3 SL NTG with some relief for a few minutes but then reocurred.  His sx were associated withfatigue.  Day of admission  he was working out in the yard he had recurrent cp  took a total of 5 SL NTG. Came to ER and was admitted .  Current Activity/ Functional Status: Patient is independent with mobility/ambulation, transfers, ADL's, IADL's.   Zubrod Score: At the time of surgery this patient's most appropriate activity status/level should be described as: []     0    Normal activity, no symptoms [x]     1    Restricted in physical strenuous activity but ambulatory, able to do out light work []     2    Ambulatory and capable of self care, unable to do work activities, up and about  more than 50%  Of the time                            []     3    Only limited self care, in bed greater than 50% of waking hours []     4    Completely disabled, no self care, confined to bed or chair []     5    Moribund  Past Medical History:  Diagnosis Date  . Arthritis    "lower back; right shoulder; left thumb; joints" (06/21/2013)  . Asthma    "not sure if this is true or not" (06/21/2013)  . CAD (coronary artery disease) May 2015   s/p PTCA/DES x 3 to mid LAD, mod non-ob disease in Cx and RCA May 2015; s/p repeat caths - last study in November of 2015 - stents patent, - FFR - managed medically.   [name] GERD (gastroesophageal reflux disease)   . Gout    "maybe twice in my life"  . Headache(784.0)    "weekly for the last 3-4 months" (06/21/2013)  . Hyperlipidemia   . Hypertension   . Myocardial infarction [facility])    "/Dr. Katharina Caper  I've had one between 2014-2015) (06/21/2013)  . Osteoarthritis   . Sleep apnea    WEIGHT LOSS, NO LONGER NEEDS PER PATIENT  . Type II diabetes mellitus ([place])    TYPE 2    Past Surgical History:  Procedure Laterality Date  . CARDIAC CATHETERIZATION  1980's   "once"  . CARDIAC CATHETERIZATION N/A 07/23/2015   Procedure: Left Heart Cath and Coronary Angiography;  Surgeon: Sherren Mocha, MD;  Location: [place] [facility];  Service: Cardiovascular;  Laterality: N/A;  . CARDIOVASCULAR STRESS TEST  06/10/2008   EF 68%  . CARPAL TUNNEL RELEASE Bilateral   . CORONARY ANGIOPLASTY WITH STENT PLACEMENT  06/21/2013   "3"  . CORONARY STENT INTERVENTION N/A 06/07/2016   Procedure: Coronary Stent Intervention;  Surgeon: Burnell Blanks, MD;  Location: [place] [facility];  Service: Cardiovascular;  Laterality: N/A;  . EXCISION MORTON'S NEUROMA Left   . EYE SURGERY Left    "removed film over my eye"  . FRACTIONAL FLOW RESERVE WIRE N/A 06/26/2013   Procedure: FRACTIONAL FLOW RESERVE WIRE;  Surgeon: [name], MD;  Location: [place] [facility];  Service: Cardiovascular;  Laterality: N/A;  . HERNIA REPAIR Left   . JOINT REPLACEMENT Left 03/2013   "thumb"  . LEFT HEART CATH AND CORONARY ANGIOGRAPHY N/A 06/07/2016   Procedure: Left Heart Cath and Coronary Angiography;  Surgeon: Burnell Blanks, MD;  Location: [place] [facility];  Service: Cardiovascular;  Laterality: N/A;  . LEFT HEART CATHETERIZATION WITH CORONARY ANGIOGRAM N/A 06/21/2013   Procedure: LEFT HEART CATHETERIZATION WITH CORONARY ANGIOGRAM;  Surgeon: Burnell Blanks, MD;  Location: [facility];  Service: Cardiovascular;  Laterality: N/A;  . LEFT HEART CATHETERIZATION WITH CORONARY ANGIOGRAM N/A 06/26/2013   Procedure: LEFT HEART CATHETERIZATION WITH CORONARY ANGIOGRAM;  Surgeon: [name], MD;  Location: [facility];  Service: Cardiovascular;  Laterality: N/A;  . TONSILLECTOMY    . TOTAL HIP ARTHROPLASTY Right 04/12/2016    Procedure: RIGHT TOTAL HIP ARTHROPLASTY ANTERIOR APPROACH;  Surgeon: Paralee Cancel, MD;  Location: [facility];  Service: Orthopedics;  Laterality: Right;  requests 70 mins  . TOTAL SHOULDER ARTHROPLASTY  03/01/2012   Procedure: TOTAL SHOULDER ARTHROPLASTY;  Surgeon: Marin Shutter, MD;  Location: [place];  Service: Orthopedics;  Laterality: Right;  . UMBILICAL HERNIA REPAIR      History  Smoking Status  . Never Smoker  Smokeless Tobacco  . Never Used    History  Alcohol Use No    Social History   Social History  . Marital status: Married    Spouse name: N/A  . Number of children: N/A  . Years of education: N/A   Occupational History  . Not on file.   Social History Main Topics  . Smoking status: Never Smoker  . Smokeless tobacco: Never Used  . Alcohol use No  . Drug use: No  . Sexual activity: Yes   Other Topics Concern  . Not on file   Social History Narrative  . No narrative on file    No Known Allergies  Current Facility-Administered Medications  Medication Dose Route Frequency Provider Last Rate Last Dose  . 0.9 %  sodium chloride infusion   Intravenous Continuous Fuller Plan A, MD 75 mL/hr at 10/28/16 1058    . 0.9 %  sodium chloride infusion   Intravenous Continuous Leonie Man, MD   Stopped at 10/28/16 1707  . 0.9 %  sodium chloride infusion  250 mL Intravenous PRN Leonie Man, MD      . acetaminophen (TYLENOL) tablet 650 mg  650 mg Oral Q4H PRN Fuller Plan A, MD      . ALPRAZolam Duanne Moron) tablet 0.5 mg  0.5 mg Oral Daily PRN Fuller Plan A, MD   0.5 mg at 10/28/16 0218  . aspirin EC tablet 81 mg  81 mg Oral QHS Smith, Rondell A, MD   81 mg at 10/28/16 0804  . atenolol (TENORMIN) tablet 50 mg  50 mg Oral QHS Smith, Rondell A, MD   50 mg at 10/28/16 0208  . atorvastatin (LIPITOR) tablet 80 mg  80 mg Oral q1800 Fuller Plan A, MD   80 mg at 10/28/16 1711  . docusate sodium (COLACE) capsule 100 mg  100 mg Oral Daily PRN Fuller Plan A, MD      .  gabapentin (NEURONTIN) capsule 300 mg  300 mg Oral QHS Smith, Rondell A, MD   300 mg at 10/28/16 0209  . gi cocktail (Maalox,Lidocaine,Donnatal)  30 mL Oral QID PRN Fuller Plan A, MD      . insulin aspart (novoLOG) injection 0-15 Units  0-15 Units Subcutaneous TID WC Fuller Plan A, MD   3 Units at 10/28/16 1711  . insulin aspart (novoLOG) injection 0-5 Units  0-5 Units Subcutaneous QHS Norval Morton, MD   Stopped at 10/27/16 2330  . morphine 4 MG/ML injection 2 mg  2 mg Intravenous Q2H PRN Fuller Plan A, MD   2 mg at 10/28/16 1530  . nitroGLYCERIN (NITROSTAT) SL tablet 0.4 mg  0.4 mg Sublingual Q5 min PRN Aldona Lento, MD   0.4 mg at 10/27/16 2030  . nitroGLYCERIN 50 mg in dextrose 5 % 250 mL (0.2 mg/mL) infusion  0-200 mcg/min Intravenous Titrated Fransico Him R, MD 12 mL/hr at 10/28/16 1600 40 mcg/min at 10/28/16 1600  . ondansetron (ZOFRAN) injection 4 mg  4 mg Intravenous Q6H PRN Fuller Plan A, MD   4 mg at 10/28/16 0010  . pantoprazole (PROTONIX) EC tablet 40 mg  40 mg Oral BID Fuller Plan A, MD   40 mg at 10/28/16 0209  . sodium chloride flush (NS) 0.9 % injection 3 mL  3 mL Intravenous Q12H Leonie Man, MD      .  sodium chloride flush (NS) 0.9 % injection 3 mL  3 mL Intravenous PRN Leonie Man, MD        Prescriptions Prior to Admission  Medication Sig Dispense Refill Last Dose  . acetaminophen (TYLENOL) 500 MG tablet Take 500 mg by mouth every 8 (eight) hours as needed (pain).   10/27/2016 at PRN  . ALPRAZolam (XANAX) 0.5 MG tablet Take 0.5 mg by mouth daily as needed for anxiety.    10/26/2016 at PRN  . amLODipine (NORVASC) 5 MG tablet Take 1 tablet (5 mg total) by mouth 2 (two) times daily. 180 tablet 3 10/27/2016 at Unknown time  . aspirin 81 MG tablet Take 81 mg by mouth at bedtime.    10/26/2016 at Unknown time  . atenolol (TENORMIN) 50 MG tablet Take 1 tablet (50 mg total) by mouth at bedtime. 90 tablet 3 10/26/2016 at Unknown time  . atorvastatin (LIPITOR)  80 MG tablet Take 80 mg by mouth daily at 6 PM.   10/26/2016 at Unknown time  . canagliflozin (INVOKANA) 300 MG TABS tablet Take 300 mg by mouth daily before breakfast.   10/27/2016 at Unknown time  . Cholecalciferol (VITAMIN D PO) Take 1 capsule by mouth daily.   10/27/2016 at Unknown time  . clopidogrel (PLAVIX) 75 MG tablet Take 1 tablet (75 mg total) by mouth daily with breakfast. 90 tablet 3 10/27/2016 at Unknown time  . docusate sodium (COLACE) 100 MG capsule Take 100 mg by mouth daily as needed for mild constipation.   Past Month at PRN  . gabapentin (NEURONTIN) 300 MG capsule Take 300 mg by mouth at bedtime.    10/26/2016 at Unknown time  . hydrochlorothiazide (MICROZIDE) 12.5 MG capsule Take 1 capsule (12.5 mg total) by mouth every Monday, Wednesday, and Friday. 36 capsule 3 10/26/2016 at Unknown time  . irbesartan (AVAPRO) 300 MG tablet Take 0.5 tablets (150 mg total) by mouth daily. 90 tablet 3 10/27/2016 at Unknown time  . isosorbide mononitrate (IMDUR) 30 MG 24 hr tablet TAKE 1 TABLET BY MOUTH TWICE (2) DAILY (Patient taking differently: Take 30 mg by mouth daily. TAKE 1 TABLET BY MOUTH TWICE (2) DAILY) 180 tablet 2 10/27/2016 at Unknown time  . metFORMIN (GLUCOPHAGE-XR) 500 MG 24 hr tablet Take 2 tablets (1,000 mg total) by mouth 2 (two) times daily.   10/27/2016 at Unknown time  . nitroGLYCERIN (NITROSTAT) 0.4 MG SL tablet Place 1 tablet (0.4 mg total) under the tongue every 5 (five) minutes as needed. For chest pain 25 tablet 5 10/27/2016 at PRN  . pantoprazole (PROTONIX) 40 MG tablet Take 40 mg by mouth 2 (two) times daily.   10/27/2016 at Unknown time  . polyethylene glycol (MIRALAX / GLYCOLAX) packet Take 17 g by mouth daily as needed for mild constipation.   Past Week at PRN  . ranolazine (RANEXA) 500 MG 12 hr tablet Take 1 tablet (500 mg total) by mouth 2 (two) times daily. 180 tablet 3 10/27/2016 at Unknown time  . traMADol (ULTRAM) 50 MG tablet Take 50-100 mg by mouth every 6 (six) hours as  needed (pain).   10/26/2016 at PRN    Family History  Problem Relation Age of Onset  . Diabetes type II Mother   . Heart attack Father   . Coronary artery disease Cousin   . Heart disease Brother      Review of Systems:  Pertinent items are noted in HPI.     Cardiac Review of Systems: Y or N  Chest Pain [ y   ]  Resting SOB [   n] Exertional SOB  [ y ]  Orthopnea [ n ]   Pedal Edema [ n  ]    Palpitations [n ] Syncope  [ n ]   Presyncope [ n  ]  General Review of Systems: [Y] = yes [  ]=no Constitional: recent weight change [ n ]; anorexia [  ]; fatigue [ y ]; nausea [  ]; night sweats [  ]; fever [  ]; or chills [  ]                                                               Dental: poor dentition[  ]; Last Dentist visit:   Eye : blurred vision [  ]; diplopia [   ]; vision changes [  ];  Amaurosis fugax[  ]; Resp: cough [ n ];  wheezing[n  ];  hemoptysis[n  ]; shortness of breath[ y ]; paroxysmal nocturnal dyspnea[ y ]; dyspnea on exertion[  ]; or orthopnea[  ];  GI:  gallstones[  ], vomiting[  ];  dysphagia[  ]; melena[  ];  hematochezia [  ]; heartburn[  ];   Hx of  Colonoscopy[  ]; GU: kidney stones [  ]; hematuria[  ];   dysuria [  ];  nocturia[  ];  history of     obstruction [  ]; urinary frequency [  ]             Skin: rash, swelling[  ];, hair loss[  ];  peripheral edema[  ];  or itching[  ]; Musculosketetal: myalgias[  ];  joint swelling[  ];  joint erythema[  ];  joint pain[  ];  back pain[ y ];  Heme/Lymph: bruising[  ];  bleeding[  ];  anemia[  ];  Neuro: TIA[  ];  headaches[  ];  stroke[  ];  vertigo[  ];  seizures[  ];   paresthesias[  ];  difficulty walking[ n ];  Psych:depression[  ]; anxiety[  ];  Endocrine: diabetes[  ];  thyroid dysfunction[  ];  Immunizations: Flu [ n ]; Pneumococcal[n  ];  Other:  Physical Exam: BP 106/82   Pulse 62   Temp 98 F (36.7 C) (Oral)   Resp 18   Ht 5\' 9"  (1.753 m)   Wt 185 lb (83.9 kg)   SpO2 96%   BMI 27.32 kg/m     General appearance: alert, cooperative, appears stated age and no distress Head: Normocephalic, without obvious abnormality, atraumatic Neck: no adenopathy, no carotid bruit, no JVD, supple, symmetrical, trachea midline and thyroid not enlarged, symmetric, no tenderness/mass/nodules Lymph nodes: Cervical, supraclavicular, and axillary nodes normal. Resp: clear to auscultation bilaterally Back: symmetric, no curvature. ROM normal. No CVA tenderness. Cardio: regular rate and rhythm, S1, S2 normal, no murmur, click, rub or gallop GI: soft, non-tender; bowel sounds normal; no masses,  no organomegaly Extremities: extremities normal, atraumatic, no cyanosis or edema and Homans sign is negative, no sign of DVT Neurologic: Grossly normal  Diagnostic Studies & Laboratory data:     Recent Radiology Findings:   Dg Chest 2 View  Result Date: 10/27/2016 CLINICAL DATA:  chest pain. Pt has had cp since yesterday. He  said it happens off and on a lot but, has been bad since yesterday. He feels pain in his left side chest from left nipple to center of chest. He said it will also hurt straight through into his back as well. Pt mentioned usually he'll feel the pain start in his back first, then into his chest. Pt explained he has experienced a lot of trouble with his heart in the past. Hx of CAD, HTN, MI, DM2, pt has had heart numerous heart catheterizations and stents EXAM: CHEST  2 VIEW COMPARISON:  07/08/2016 FINDINGS: Cardiac silhouette is normal in size and configuration. No mediastinal or hilar masses. No evidence of adenopathy. Clear lungs. No pleural effusion or pneumothorax. Right shoulder prosthesis, partly imaged appears well-seated and aligned. IMPRESSION: 1. No active cardiopulmonary disease. Electronically Signed   By: Lajean Manes M.D.   On: 10/27/2016 19:50     I have independently reviewed the above radiologic studies.  Recent Lab Findings: Lab Results  Component Value Date   WBC 4.3  10/28/2016   HGB 12.9 (L) 10/28/2016   HCT 37.9 (L) 10/28/2016   PLT 125 (L) 10/28/2016   GLUCOSE 130 (H) 10/28/2016   CHOL 84 10/28/2016   TRIG 31 10/28/2016   HDL 36 (L) 10/28/2016   LDLCALC 42 10/28/2016   ALT 15 (L) 06/06/2016   AST 30 06/06/2016   NA 137 10/28/2016   K 3.5 10/28/2016   CL 107 10/28/2016   CREATININE 0.95 10/28/2016   BUN 17 10/28/2016   CO2 24 10/28/2016   TSH 2.009 06/06/2016   INR 1.20 10/28/2016   HGBA1C 7.1 (H) 06/06/2016   CATH: Procedures   INTRAVASCULAR PRESSURE WIRE/FFR STUDY  LEFT HEART CATH AND CORONARY ANGIOGRAPHY  Conclusion     Ost LM lesion, 40 %stenosed.  Mid Cx lesion, 60 %stenosed. Likely not flow-limiting  Dist LAD lesion, 40 %stenosed. Distal to stent segment  1st Diag lesion, 80 %stenosed. Relatively small caliber vessel  Ost RPDA to RPDA lesion, 40 %stenosed.  RPDA lesion, 85 %stenosed.  2nd Diag lesion, 70 %stenosed. Relatively small caliber  Proximal and mid RCA 30-40% lesions with mild calcification.  1st Mrg lesion, 75 %stenosed. FFR 0.8 - significant  Dist RCA Synergy DES from April 2018, 0 %stenosed.  Prox LAD to Dist LAD stented segment (3 overlapping DES), ~ diffuse 50 %stenosed. FFR post stent 0.73, mid stent 0.76, pre stent 0.82  The left ventricular ejection fraction is 55-65% by visual estimate.   Angiographically stable coronary arteries but with FFR significance in the LAD and OM1 suggesting progression of intraluminal disease  Due to the extent of disease segment in the LAD, likely not a good redo PCI target. I discussed with Dr. Burt Knack, he feels strongly that the best option is for the patient be referred for coronary artery bypass surgery, and I agree.  He'll be transferred back to his unit for ongoing care with TR band removal.  Recommendation upper discussion with Dr. Burt Knack: The patient does not have critical coronary artery disease stenosis, and really has no significant disease change  from before. Some of his symptoms may be atypical in nature and eventually nonanginal however he still does have classic anginal symptoms.  He has no acute requirement for inpatient monitoring pending bypass evaluation. We feel it safe for him to be discharged with pain management plan (with likely recommend narcotic pain management for short-term) and follow-up with Dr. Servando Snare early next week.  Would otherwise continue his other antianginal regimen.  I have discussed scheduling with CT surgery. It is unlikely that they will go see him as an inpatient and therefore would be preferable to set up outpatient evaluation. I would discontinue Plavix with the potential of possible CABG early next week. If this would not be possible, then would continue Plavix until's 5 days pre-op.   Glenetta Hew, M.D., M.S.   I have independently reviewed the above  cath films and reviewed the findings with the  patient .   Previous cath: Procedures   Coronary Stent Intervention  Left Heart Cath and Coronary Angiography  Conclusion     RPDA lesion, 75 %stenosed.  Ost LM lesion, 40 %stenosed.  1st Mrg lesion, 60 %stenosed.  Prox LAD to Dist LAD lesion, 10 %stenosed.  Dist LAD lesion, 40 %stenosed.  2nd Diag lesion, 80 %stenosed.  1st Diag lesion, 80 %stenosed.  Dist RCA lesion, 90 %stenosed.  Ost RPDA to RPDA lesion, 95 %stenosed.  Mid RCA lesion, 40 %stenosed.  Prox RCA to Mid RCA lesion, 40 %stenosed.  Mid Cx lesion, 60 %stenosed.  The left ventricular systolic function is normal.  LV end diastolic pressure is normal.  The left ventricular ejection fraction is 55-65% by visual estimate.  There is no mitral valve regurgitation.   1. Triple vessel CAD 2. Severe stenosis ostium of the moderate caliber posterolateral branch. Successful PTCA/DES x 1 proximal posterolateral artery.  3. Severe stenosis small to moderate caliber PDA. Successful PTCA with balloon angioplasty only  proximal PDA.  4. Patent stent mid LAD.  5. Diffuse disease in the small caliber diagonal branches.  6. Normal LV systolic function 7. Mild left main stenosis.   Recommendations: Continue DAPT for at least one year. Continue statin, beta blocker, Norvasc and Ranexa.     I have independently reviewed the above  cath films and reviewed the findings with the  patient .     Assessment / Plan:    Diffuse Coronary Artery Disease with unstable angina on admission. Films have been reviewed, he has CADdisease but unclear what is the culprit lesion. I have discussed possible CABG with the patient next Monday September 14, and stopping Plavix September 19. I will review films with Dr Burt Knack on his return Monday before making final decision.         I  spent 40 minutes counseling the patient face to face and 50% or more the  time was spent in counseling and coordination of care. The total time spent in the appointment was 60 minutes.    Grace Isaac MD      [address] [place] [address] Office [phone]   Beeper [phone]  10/28/2016 5:23 PM

## 2016-10-28 NOTE — ED Notes (Signed)
FSBS 109

## 2016-10-28 NOTE — Progress Notes (Signed)
Pre-op Cardiac Surgery  Carotid Findings:  Bilateral 1-39% ICA stenosis, tortuous distal ICA's bilaterally, antegrade vertebral flow.   Upper Extremity Right Left  Brachial Pressures 130  Triphasic 139  Triphasic  Radial Waveforms Triphasic Triphasic  Ulnar Waveforms Triphasic Triphasic  Palmar Arch (Allen's Test) TR compression band present on the radial artery. Radial artery is compressed throughout the entirety of this study. Palmar waveform of the ulnar artery is obliterated with compression. Palmar waveforms are obliterated with radial and ulnar compression.  Findings:     Lower  Extremity Right Left  Dorsalis Pedis 163 172  Posterior Tibial 167 175  Ankle/Brachial Indices 1.2 1.26   Findings:   Right ABI of 1.2 and left ABI of 1.26 are suggestive of arterial flow within normal limits at rest.  [place], RVT 4:10 PM  10/28/2016

## 2016-10-29 DIAGNOSIS — I209 Angina pectoris, unspecified: Secondary | ICD-10-CM

## 2016-10-29 DIAGNOSIS — I208 Other forms of angina pectoris: Secondary | ICD-10-CM | POA: Diagnosis not present

## 2016-10-29 LAB — GLUCOSE, CAPILLARY
GLUCOSE-CAPILLARY: 109 mg/dL — AB (ref 65–99)
Glucose-Capillary: 145 mg/dL — ABNORMAL HIGH (ref 65–99)

## 2016-10-29 LAB — VAS US DOPPLER PRE CABG
LEFT ECA DIAS: -13 cm/s
LEFT VERTEBRAL DIAS: -17 cm/s
Left CCA dist dias: -19 cm/s
Left CCA dist sys: -88 cm/s
Left CCA prox dias: 15 cm/s
Left CCA prox sys: 124 cm/s
Left ICA dist dias: -24 cm/s
Left ICA dist sys: -74 cm/s
Left ICA prox dias: -19 cm/s
Left ICA prox sys: -71 cm/s
RIGHT ECA DIAS: -22 cm/s
RIGHT VERTEBRAL DIAS: -8 cm/s
Right CCA prox dias: 13 cm/s
Right CCA prox sys: 77 cm/s
Right cca dist sys: -60 cm/s

## 2016-10-29 MED ORDER — AMLODIPINE BESYLATE 5 MG PO TABS: 2.5000 mg | ORAL_TABLET | Freq: Every day | ORAL | Status: DC

## 2016-10-29 MED ORDER — ISOSORBIDE MONONITRATE ER 60 MG PO TB24
60.0000 mg | ORAL_TABLET | Freq: Every day | ORAL | Status: DC
  Administered 2016-10-29: 60 mg via ORAL
  Filled 2016-10-29: qty 1

## 2016-10-29 MED ORDER — ISOSORBIDE MONONITRATE ER 30 MG PO TB24: 60.0000 mg | ORAL_TABLET | Freq: Every day | ORAL | Status: DC

## 2016-10-29 MED ORDER — TRAMADOL HCL 50 MG PO TABS: 50.0000 mg | ORAL_TABLET | Freq: Four times a day (QID) | ORAL | 0 refills | Status: DC | PRN

## 2016-10-29 NOTE — Progress Notes (Signed)
CARDIAC REHAB PHASE I   Pt ambulates independently, hopeful for discharge today. Completed cardiac surgery pre-op education with pt and wife at bedside. Reviewed IS, sternal precautions, activity progression, cardiac surgery booklet and cardiac surgery guidelines. Pt verbalized understanding, declined cardiac surgery videos at this time. Pt in recliner, call bell within reach. Will follow post-op.  [phone] Lenna Sciara, RN, BSN 10/29/2016 10:46 AM

## 2016-10-29 NOTE — Discharge Summary (Signed)
Discharge Summary    Patient ID: Hunter Mcbride.,  MRN: [id number], DOB/AGE: [birth year] 71 y.o.  Admit date: 10/27/2016 Discharge date: 10/29/2016  Primary Care Provider: Crist Infante Primary Cardiologist: Dr. Burt Knack  Discharge Diagnoses    Principal Problem:   Chest pain Active Problems:   Hypertension   Hyperlipidemia   Coronary atherosclerosis of native coronary artery   Type 2 diabetes mellitus treated without insulin (HCC)   GERD (gastroesophageal reflux disease)   Angina pectoris [facility])    Diagnostic Studies/Procedures    Cardiac catheterization this admission, please see full report and below for summary. _____________     History of Present Illness     Hunter Mcbride. is a 71 y.o. male with history of extensive history of CAD s/p DES x3 to LAD in 2015, HTN, HLD, DM, OSA, arthritis, asthma who was admitted with chest pain concerning for angina. He has had multiple evaluations with caths and nuclear stress tests since his PCI in 2015, [place]. Medical therapy has been limited by success as well as cost with Ranexa. In 05/2016 he underwent repeat cath 06/07/2016 showing triple vessel CAD with severe stenosis of the PL and severe stenosis of a small to moderate PDA and underwent PTA of the PDA and PCI with DES to the PL.  The mid LAD stent was patent. The plan was to consider CABG vs. PCI vs ongoing medical rx. He was admitted with recurrent chest pain and burning in his back, with brief relief from NTG.  Hospital Course    Troponins were negative. He underwent cath demonstrating:   Ost LM lesion, 40 %stenosed.  Mid Cx lesion, 60 %stenosed. Likely not flow-limiting  Dist LAD lesion, 40 %stenosed. Distal to stent segment  1st Diag lesion, 80 %stenosed. Relatively small caliber vessel  Ost RPDA to RPDA lesion, 40 %stenosed.  RPDA lesion, 85 %stenosed.  2nd Diag lesion, 70 %stenosed. Relatively small caliber  Proximal and mid RCA 30-40% lesions  with mild calcification.  1st Mrg lesion, 75 %stenosed. FFR 0.8 - significant  Dist RCA Synergy DES from April 2018, 0 %stenosed.  Prox LAD to Dist LAD stented segment (3 overlapping DES), ~ diffuse 50 %stenosed. FFR post stent 0.73, mid stent 0.76, pre stent 0.82  The left ventricular ejection fraction is 55-65% by visual estimate.  Dr. Ellyn Hack felt he had angiographically stable coronary arteries but with FFR significance in the LAD and OM1 suggesting progression of intraluminal disease. He reviewed films with Dr. Burt Knack - due to the extent of disease segment in the LAD, likely not a good redo PCI target. His pain has been difficult to sort out given typical and atypical features. They felt strongly best option is for the patient be referred for coronary artery bypass surgery. He was evaluated by Dr. Servando Snare who discussed possible CABG with the patient next Monday, and stopping Plavix September 19th. He plans to review films with Dr. Burt Knack upon his return Monday 10/31/16 before making final decision.  As there was no sign of ACS this admission, heparin was stopped - Dr. Caryl Comes has seen the patient today - he has seen and examined the patient today and feels he is stable for discharge. The patient has a pre-operative testing appointment on 11/03/16 and is likely to return for CABG 11/07/16 - Dr. Everrett Coombe office to assist with arranging. Per Dr. Servando Snare, may use Tramadol for pain. Last fill via [facility] was 03/2016, no inappropriate fills recently. The patient's BP was  somewhat low yesterday. Losartan and HCTZ were held during this admission. Imdur was increased to 60mg  daily. Per discussion with Dr. Caryl Comes will resume amlodipine but at lower dose (was on 5mg  BID which was held here, resuming 2.5mg  daily). Patient and his wife planned to use the tabs they have at home and declined new rx to be sent in at this time. Also sent Dr. Servando Snare a staff message informing him of patient's discharge. Dr. Prescott Gum  visited the patient and his wife today and recommended his last dose of Plavix be 11/02/16. If BP improved at f/u visit, could consider future resumption of ARB due to DM. He was also advised to restart Metformin on 10/31/16.   _____________  Discharge Vitals Blood pressure 133/77, pulse (!) 48, temperature 98.3 F (36.8 C), temperature source Oral, resp. rate 15, height 5\' 9"  (1.753 m), weight 185 lb (83.9 kg), SpO2 99 %.  Filed Weights   10/27/16 1819 10/28/16 0956  Weight: 181 lb (82.1 kg) 185 lb (83.9 kg)    Labs & Radiologic Studies    CBC  Recent Labs  10/27/16 1821 10/28/16 0454  WBC 5.3 4.3  NEUTROABS  --  2.0  HGB 15.0 12.9*  HCT 43.2 37.9*  MCV 88.2 87.1  PLT 155 742*   Basic Metabolic Panel  Recent Labs  10/27/16 1821 10/28/16 0454  NA 137 137  K 4.3 3.5  CL 105 107  CO2 24 24  GLUCOSE 262* 130*  BUN 20 17  CREATININE 1.04 0.95  CALCIUM 9.9 8.8*   Cardiac Enzymes  Recent Labs  10/28/16 0126 10/28/16 0454 10/28/16 0911  TROPONINI <0.03 <0.03 <0.03   Fasting Lipid Panel  Recent Labs  10/28/16 0455  CHOL 84  HDL 36*  LDLCALC 42  TRIG 31  CHOLHDL 2.3   _____________  Dg Chest 2 View  Result Date: 10/27/2016 CLINICAL DATA:  chest pain. Pt has had cp since yesterday. He said it happens off and on a lot but, has been bad since yesterday. He feels pain in his left side chest from left nipple to center of chest. He said it will also hurt straight through into his back as well. Pt mentioned usually he'll feel the pain start in his back first, then into his chest. Pt explained he has experienced a lot of trouble with his heart in the past. Hx of CAD, HTN, MI, DM2, pt has had heart numerous heart catheterizations and stents EXAM: CHEST  2 VIEW COMPARISON:  07/08/2016 FINDINGS: Cardiac silhouette is normal in size and configuration. No mediastinal or hilar masses. No evidence of adenopathy. Clear lungs. No pleural effusion or pneumothorax. Right shoulder  prosthesis, partly imaged appears well-seated and aligned. IMPRESSION: 1. No active cardiopulmonary disease. Electronically Signed   By: Lajean Manes M.D.   On: 10/27/2016 19:50   Disposition   Pt is being discharged home today in good condition.  Follow-up Plans & Appointments    Follow-up Information    Grace Isaac, MD Follow up.   Specialty:  Cardiothoracic Surgery Why:  Dr. Everrett Coombe office has discussed your follow-up plan which includes pre-operative testing on 11/03/16. Please call his office on Monday to finalize details for this appointment. Feel free to call Dr. Antionette Char office as well if you have any questions. Contact information: [place] [address] [phone]          Discharge Instructions    Diet - low sodium heart healthy  Complete by:  As directed    Increase activity slowly    Complete by:  As directed    Your amlodipine was decreased to 2.5mg  daily - this is half of a 5mg  pill once a day.   Your isosorbide was increased to 60mg  daily - this would be 2 of your 30mg  tablets once a day.  Your losartan and hydrochlorothiazide were both stopped for now. These might be resumed at a later time. Please discuss at follow-up visit.  Your last dose of Plavix (Clopidogrel) will be 11/02/16.  Do not restart your Metformin until Monday 10/31/16 due to your recent heart cath.  No driving for 2 days. No lifting over 5 lbs for sexual activity until cleared by your doctor. Avoid strenuous physical activity for now. Keep procedure site clean & dry. If you notice increased pain, swelling, bleeding or pus, call/return!  You may shower, but no soaking baths/hot tubs/pools for 1 week.      Discharge Medications   Allergies as of 10/29/2016   No Known Allergies     Medication List    STOP taking these medications   hydrochlorothiazide 12.5 MG capsule Commonly known as:  MICROZIDE   irbesartan 300 MG tablet Commonly known as:   AVAPRO     TAKE these medications   acetaminophen 500 MG tablet Commonly known as:  TYLENOL Take 500 mg by mouth every 8 (eight) hours as needed (pain).   ALPRAZolam 0.5 MG tablet Commonly known as:  XANAX Take 0.5 mg by mouth daily as needed for anxiety.   amLODipine 5 MG tablet Commonly known as:  NORVASC Take 0.5 tablets (2.5 mg total) by mouth daily. What changed:  how much to take  when to take this   aspirin 81 MG tablet Take 81 mg by mouth at bedtime.   atenolol 50 MG tablet Commonly known as:  TENORMIN Take 1 tablet (50 mg total) by mouth at bedtime.   atorvastatin 80 MG tablet Commonly known as:  LIPITOR Take 80 mg by mouth daily at 6 PM.   clopidogrel 75 MG tablet Commonly known as:  PLAVIX Take 1 tablet (75 mg total) by mouth daily with breakfast.   docusate sodium 100 MG capsule Commonly known as:  COLACE Take 100 mg by mouth daily as needed for mild constipation.   gabapentin 300 MG capsule Commonly known as:  NEURONTIN Take 300 mg by mouth at bedtime.   INVOKANA 300 MG Tabs tablet Generic drug:  canagliflozin Take 300 mg by mouth daily before breakfast.   isosorbide mononitrate 30 MG 24 hr tablet Commonly known as:  IMDUR Take 2 tablets (60 mg total) by mouth daily. What changed:  how much to take  how to take this  when to take this  additional instructions   metFORMIN 500 MG 24 hr tablet Commonly known as:  GLUCOPHAGE-XR Take 2 tablets (1,000 mg total) by mouth 2 (two) times daily.   nitroGLYCERIN 0.4 MG SL tablet Commonly known as:  NITROSTAT Place 1 tablet (0.4 mg total) under the tongue every 5 (five) minutes as needed. For chest pain   pantoprazole 40 MG tablet Commonly known as:  PROTONIX Take 40 mg by mouth 2 (two) times daily.   polyethylene glycol packet Commonly known as:  MIRALAX / GLYCOLAX Take 17 g by mouth daily as needed for mild constipation.   ranolazine 500 MG 12 hr tablet Commonly known as:  RANEXA Take 1  tablet (500 mg total) by mouth 2 (two)  times daily.   traMADol 50 MG tablet Commonly known as:  ULTRAM Take 1-2 tablets (50-100 mg total) by mouth every 6 (six) hours as needed (pain).   VITAMIN D PO Take 1 capsule by mouth daily.            Discharge Care Instructions        Start     Ordered   10/29/16 0000  amLODipine (NORVASC) 5 MG tablet  Daily    Question:  Supervising Provider  Answer:  Jolaine Artist   10/29/16 1443   10/29/16 0000  isosorbide mononitrate (IMDUR) 30 MG 24 hr tablet  Daily    Question:  Supervising Provider  Answer:  Jolaine Artist   10/29/16 1443   10/29/16 0000  traMADol (ULTRAM) 50 MG tablet  Every 6 hours PRN    Question:  Supervising Provider  Answer:  Jolaine Artist   10/29/16 1443   10/29/16 0000  Diet - low sodium heart healthy     10/29/16 1443   10/29/16 0000  Increase activity slowly     10/29/16 1444       Allergies:  No Known Allergies   Outstanding Labs/Studies   N/a  Duration of Discharge Encounter   Greater than 30 minutes including physician time.  Signed, Nedra Hai Dunn PA-C 10/29/2016, 2:46 PM  Pt sen and examined Note amended as necessary

## 2016-10-29 NOTE — Progress Notes (Signed)
Progress Note  Patient Name: Hunter Mcbride. Date of Encounter: 10/29/2016  Primary Cardiologist: [facility]     Patient Profile       Hunter Mcbride. is a 71 y.o. male with a history of extensive CAD >>hx of PCI of the LAD in 2015 with overlapping DES. He's had repeated cardiac cath evaluations with FFR of the PDA and first OM branches of the circumflex in 2016 which was negative. He returned with worsening angina in April 2018 and underwent PCI of the distal RCA bifurcation with stenting of the distal RCA into the PLA branch with immediate relief from his chest pain  Now w recurrent CP  Seen by Dr Gerilyn Pilgrim with consideration of CABG   Dr Darcus Pester notes also reviewed  Not sure if this is ischemic pain   He has different chest pain syndromes-- most recently one distinct from his chronic angina  Subjective  No chest  Pain since procedure yesterday Pt reports DR EG suggested he use tramadol at home for pain  Inpatient Medications    Scheduled Meds: . aspirin EC  81 mg Oral QHS  . atenolol  50 mg Oral QHS  . atorvastatin  80 mg Oral q1800  . gabapentin  300 mg Oral QHS  . insulin aspart  0-15 Units Subcutaneous TID WC  . insulin aspart  0-5 Units Subcutaneous QHS  . pantoprazole  40 mg Oral BID  . sodium chloride flush  3 mL Intravenous Q12H   Continuous Infusions: . sodium chloride 75 mL/hr at 10/29/16 0400  . sodium chloride    . nitroGLYCERIN 15 mcg/min (10/29/16 0742)   PRN Meds: sodium chloride, acetaminophen, ALPRAZolam, docusate sodium, gi cocktail, morphine injection, nitroGLYCERIN, ondansetron (ZOFRAN) IV, sodium chloride flush   Vital Signs    Vitals:   10/29/16 0000 10/29/16 0300 10/29/16 0400 10/29/16 0700  BP: (!) 95/52 115/70 (!) 99/50   Pulse: (!) 48 (!) 53 (!) 49 (!) 47  Resp: 13 (!) 21 17 16   Temp:  97.9 F (36.6 C)  98 F (36.7 C)  TempSrc:  Oral  Oral  SpO2: 94% 99% 95% 98%  Weight:      Height:        Intake/Output Summary (Last 24 hours) at  10/29/16 0939 Last data filed at 10/29/16 0400  Gross per 24 hour  Intake          3101.71 ml  Output              300 ml  Net          2801.71 ml   Filed Weights   10/27/16 1819 10/28/16 0956  Weight: 181 lb (82.1 kg) 185 lb (83.9 kg)    Telemetry    sinus Personally Reviewed    Physical Exam   GEN: No acute distress.   Neck: JVDflat Cardiac: RRR, no  murmurs, rubs, or gallops.  Respiratory: Clear to auscultation bilaterally. GI: Soft, nontender, non-distended  MS: no edema; No deformity. Neuro:  Nonfocal  Psych: Normal affect  Skin Warm and dry   Labs    Chemistry Recent Labs Lab 10/27/16 1821 10/28/16 0454  NA 137 137  K 4.3 3.5  CL 105 107  CO2 24 24  GLUCOSE 262* 130*  BUN 20 17  CREATININE 1.04 0.95  CALCIUM 9.9 8.8*  GFRNONAA >60 >60  GFRAA >60 >60  ANIONGAP 8 6     Hematology Recent Labs Lab 10/27/16 1821 10/28/16 0454  WBC  5.3 4.3  RBC 4.90 4.35  HGB 15.0 12.9*  HCT 43.2 37.9*  MCV 88.2 87.1  MCH 30.6 29.7  MCHC 34.7 34.0  RDW 14.4 14.3  PLT 155 125*    Cardiac Enzymes Recent Labs Lab 10/27/16 2248 10/28/16 0126 10/28/16 0454 10/28/16 0911  TROPONINI <0.03 <0.03 <0.03 <0.03    Recent Labs Lab 10/27/16 1834  TROPIPOC 0.00     BNPNo results for input(s): BNP, PROBNP in the last 168 hours.   DDimer No results for input(s): DDIMER in the last 168 hours.   Radiology    Dg Chest 2 View  Result Date: 10/27/2016 CLINICAL DATA:  chest pain. Pt has had cp since yesterday. He said it happens off and on a lot but, has been bad since yesterday. He feels pain in his left side chest from left nipple to center of chest. He said it will also hurt straight through into his back as well. Pt mentioned usually he'll feel the pain start in his back first, then into his chest. Pt explained he has experienced a lot of trouble with his heart in the past. Hx of CAD, HTN, MI, DM2, pt has had heart numerous heart catheterizations and stents EXAM:  CHEST  2 VIEW COMPARISON:  07/08/2016 FINDINGS: Cardiac silhouette is normal in size and configuration. No mediastinal or hilar masses. No evidence of adenopathy. Clear lungs. No pleural effusion or pneumothorax. Right shoulder prosthesis, partly imaged appears well-seated and aligned. IMPRESSION: 1. No active cardiopulmonary disease. Electronically Signed   By: Lajean Manes M.D.   On: 10/27/2016 19:50    Cardiac Studies   Cath revieweed and surgical consult    Assessment & Plan    CAD complex with prior revascularization  Chronic pain  Pt for CABG 9/24 with plan to stop plavix on 9/19 and see EG 9/20 Will discharge today  Have stopped IV NTG And have returned him to Imdur 60 and will use daily Per EG can use tramadol for pain O/w continue current meds   Signed, Virl Axe, MD  10/29/2016, 9:39 AM

## 2016-10-29 NOTE — Progress Notes (Signed)
Discharge note. Patient, wife, and son educated at bedside. RN educated on follow up appointments, medications and when to take them, educated specifically on when to stop taking Plavix and when able to restart taking metformin due to recent heart cath, signs and symptoms to look for, when to call the MD, and when to seek immediate medical attention. PIV removed without complications and all patient's questions answered.   Patient discharged by wheelchair with RN.

## 2016-10-31 ENCOUNTER — Other Ambulatory Visit: Payer: Self-pay | Admitting: *Deleted

## 2016-10-31 ENCOUNTER — Encounter (HOSPITAL_COMMUNITY): Payer: Self-pay | Admitting: Cardiology

## 2016-10-31 DIAGNOSIS — I2510 Atherosclerotic heart disease of native coronary artery without angina pectoris: Secondary | ICD-10-CM

## 2016-10-31 NOTE — Research (Signed)
OPTIMIZE Informed Consent   Subject Name: Hunter Mcbride.  Subject met inclusion and exclusion criteria.  The informed consent form, study requirements and expectations were reviewed with the subject and questions and concerns were addressed prior to the signing of the consent form.  The subject verbalized understanding of the trail requirements.  The subject agreed to participate in the OPTIMIZE trial and signed the informed consent.  The informed consent was obtained prior to performance of any protocol-specific procedures for the subject.  A copy of the signed informed consent was given to the subject and a copy was placed in the subject's medical record.  [name] D 10/28/16 3491PH

## 2016-11-01 ENCOUNTER — Telehealth: Payer: Self-pay | Admitting: *Deleted

## 2016-11-01 NOTE — Telephone Encounter (Signed)
S/w pt's wife per DPR made appt for post cabg per [name].  [name] also s/w pt and pt's wife.

## 2016-11-02 NOTE — Pre-Procedure Instructions (Signed)
Hunter Garnet Jr.  11/02/2016      [facility] # [address] [place], [place] [address] [place] [address] Phone: [phone] Fax: [phone]  [facility] [place], [place] - [address] [place] [address] Phone: [phone] Fax: [place], [place] - [address] [place] [address] Phone: [phone] Fax: [phone]    Your procedure is scheduled on Monday, Sept. 24th   Report to [facility] at 5:30 Am             (posted surgery time 7:30a- 2p)   Call this number if you have problems the MORNING of surgery:  [phone].   Remember:  Do not eat food or drink liquids after midnight Sunday.              4-5 days prior to surgery, STOP TAKING any Vitamins, Herbal Supplements, Anti-inflammatories.   Take these medicines the morning of surgery with A SIP OF WATER : Xanax, Amlodipine, Gabapentin, Isosorbide.              Last dose of Plavix to be taken ___________________________    Do not wear jewelry - no rings or watches.  Do not wear lotions, colognes, or deoderant.             Men may shave face and neck.   Do not bring valuables to the hospital.  [facility] is not responsible for any belongings or valuables.  Contacts, dentures or bridgework may not be worn into surgery.  Leave your suitcase in the car.  After surgery it may be brought to your room.  For patients admitted to the hospital, discharge time will be determined by your treatment team.  Please read over the following fact sheets that you were given. Pain Booklet, MRSA Information and Surgical Site Infection Prevention         How to Manage Your Diabetes Before and After Surgery  Why is it important to control my blood sugar before and after surgery? . Improving blood sugar levels before and after surgery helps healing  and can limit problems. . A way of improving blood sugar control is eating a healthy diet by: o  Eating less sugar and carbohydrates o  Increasing activity/exercise o  Talking with your doctor about reaching your blood sugar goals . High blood sugars (greater than 180 mg/dL) can raise your risk of infections and slow your recovery, so you will need to focus on controlling your diabetes during the weeks before surgery. . Make sure that the doctor who takes care of your diabetes knows about your planned surgery including the date and location.  How do I manage my blood sugar before surgery? . Check your blood sugar at least 4 times a day, starting 2 days before surgery, to make sure that the level is not too high or low. [name]  o Check your blood sugar the morning of your surgery when you wake up and every 2 hours until you get to the Short Stay unit. o  . If your blood sugar is less than 70 mg/dL, you will need to treat for low blood sugar: o Do not take insulin. o Treat a low blood sugar (less than 70 mg/dL) with  cup of clear juice (cranberry or apple), 4 glucose tablets, OR glucose gel. o  o Recheck blood sugar in 15  minutes after treatment (to make sure it is greater than 70 mg/dL). If your blood sugar is not greater than 70 mg/dL on recheck, call [phone] for further instructions. . Report your blood sugar to the short stay nurse when you get to Short Stay.  . If you are admitted to the hospital after surgery: o Your blood sugar will be checked by the staff and you will probably be given insulin after surgery (instead of oral diabetes medicines) to make sure you have good blood sugar levels. o The goal for blood sugar control after surgery is 80-180 mg/dL.  WHAT DO I DO ABOUT MY DIABETES MEDICATION?   [name] Do not take oral diabetes medicines (pills) the morning of surgery.  . The day of surgery, do not take other diabetes injectables, including Byetta (exenatide), Bydureon (exenatide  ER), Victoza (liraglutide), or Trulicity (dulaglutide).  . If your CBG is greater than 220 mg/dL, you may take  of your sliding scale (correction) dose of insulin.  Other Instructions:         Patient Signature:  Date:   Nurse Signature:  Date:    [facility]- Preparing For Surgery  Before surgery, you can play an important role. Because skin is not sterile, your skin needs to be as free of germs as possible. You can reduce the number of germs on your skin by washing with CHG (chlorahexidine gluconate) Soap before surgery.  CHG is an antiseptic cleaner which kills germs and bonds with the skin to continue killing germs even after washing.  Please do not use if you have an allergy to CHG or antibacterial soaps. If your skin becomes reddened/irritated stop using the CHG.  Do not shave (including legs and underarms) for at least 48 hours prior to first CHG shower. It is OK to shave your face.  Please follow these instructions carefully.   1. Shower the NIGHT BEFORE SURGERY and the MORNING OF SURGERY with CHG.   2. If you chose to wash your hair, wash your hair first as usual with your normal shampoo.  3. After you shampoo, rinse your hair and body thoroughly to remove the shampoo.  4. Use CHG as you would any other liquid soap. You can apply CHG directly to the skin and wash gently with a scrungie or a clean washcloth.   5. Apply the CHG Soap to your body ONLY FROM THE NECK DOWN.  Do not use on open wounds or open sores. Avoid contact with your eyes, ears, mouth and genitals (private parts). Wash genitals (private parts) with your normal soap.  6. Wash thoroughly, paying special attention to the area where your surgery will be performed.  7. Thoroughly rinse your body with warm water from the neck down.  8. DO NOT shower/wash with your normal soap after using and rinsing off the CHG Soap.  9. Pat yourself dry with a CLEAN TOWEL.   10. Wear CLEAN PAJAMAS   11. Place CLEAN  SHEETS on your bed the night of your first shower and DO NOT SLEEP WITH PETS.    Day of Surgery: Do not apply any deodorants/lotions. Please wear clean clothes to the hospital/surgery center.

## 2016-11-03 ENCOUNTER — Other Ambulatory Visit: Payer: Self-pay | Admitting: Cardiothoracic Surgery

## 2016-11-03 ENCOUNTER — Encounter (HOSPITAL_COMMUNITY)
Admission: RE | Admit: 2016-11-03 | Discharge: 2016-11-03 | Disposition: A | Discharge status: Home / Self Care | Payer: PPO | Source: Ambulatory Visit | Attending: Cardiothoracic Surgery | Admitting: Cardiothoracic Surgery

## 2016-11-03 ENCOUNTER — Encounter: Payer: Self-pay | Admitting: Cardiothoracic Surgery

## 2016-11-03 ENCOUNTER — Ambulatory Visit (INDEPENDENT_AMBULATORY_CARE_PROVIDER_SITE_OTHER): Payer: PPO | Admitting: Cardiothoracic Surgery

## 2016-11-03 ENCOUNTER — Ambulatory Visit (HOSPITAL_BASED_OUTPATIENT_CLINIC_OR_DEPARTMENT_OTHER)
Admission: RE | Admit: 2016-11-03 | Discharge: 2016-11-03 | Disposition: A | Discharge status: Home / Self Care | Payer: PPO | Source: Ambulatory Visit | Attending: Cardiothoracic Surgery | Admitting: Cardiothoracic Surgery

## 2016-11-03 ENCOUNTER — Encounter (HOSPITAL_COMMUNITY): Payer: Self-pay

## 2016-11-03 ENCOUNTER — Ambulatory Visit (HOSPITAL_COMMUNITY)
Admission: RE | Admit: 2016-11-03 | Discharge: 2016-11-03 | Disposition: A | Discharge status: Home / Self Care | Payer: PPO | Source: Ambulatory Visit | Attending: Cardiothoracic Surgery | Admitting: Cardiothoracic Surgery

## 2016-11-03 ENCOUNTER — Other Ambulatory Visit (HOSPITAL_COMMUNITY): Payer: PPO

## 2016-11-03 VITALS — BP 133/80 | HR 63 | Resp 16 | Ht 69.0 in | Wt 185.0 lb

## 2016-11-03 DIAGNOSIS — I252 Old myocardial infarction: Secondary | ICD-10-CM | POA: Diagnosis not present

## 2016-11-03 DIAGNOSIS — E785 Hyperlipidemia, unspecified: Secondary | ICD-10-CM | POA: Diagnosis not present

## 2016-11-03 DIAGNOSIS — I25110 Atherosclerotic heart disease of native coronary artery with unstable angina pectoris: Secondary | ICD-10-CM

## 2016-11-03 DIAGNOSIS — Z01818 Encounter for other preprocedural examination: Secondary | ICD-10-CM | POA: Insufficient documentation

## 2016-11-03 DIAGNOSIS — I2510 Atherosclerotic heart disease of native coronary artery without angina pectoris: Secondary | ICD-10-CM

## 2016-11-03 DIAGNOSIS — I25119 Atherosclerotic heart disease of native coronary artery with unspecified angina pectoris: Secondary | ICD-10-CM

## 2016-11-03 DIAGNOSIS — E119 Type 2 diabetes mellitus without complications: Secondary | ICD-10-CM | POA: Insufficient documentation

## 2016-11-03 DIAGNOSIS — I10 Essential (primary) hypertension: Secondary | ICD-10-CM | POA: Diagnosis not present

## 2016-11-03 DIAGNOSIS — Z01812 Encounter for preprocedural laboratory examination: Secondary | ICD-10-CM | POA: Insufficient documentation

## 2016-11-03 DIAGNOSIS — I361 Nonrheumatic tricuspid (valve) insufficiency: Secondary | ICD-10-CM | POA: Insufficient documentation

## 2016-11-03 LAB — URINALYSIS, ROUTINE W REFLEX MICROSCOPIC
Bacteria, UA: NONE SEEN
Bilirubin Urine: NEGATIVE
Glucose, UA: >=500 mg/dL — AB
Hgb urine dipstick: NEGATIVE
Ketones, ur: NEGATIVE mg/dL
Leukocytes, UA: NEGATIVE
Nitrite: NEGATIVE
Protein, ur: NEGATIVE mg/dL
Specific Gravity, Urine: 1.03 (ref 1.005–1.030)
Squamous Epithelial / LPF: NONE SEEN
pH: 5 (ref 5.0–8.0)

## 2016-11-03 LAB — COMPREHENSIVE METABOLIC PANEL
ALT: 44 U/L (ref 17–63)
AST: 27 U/L (ref 15–41)
Albumin: 4 g/dL (ref 3.5–5.0)
Alkaline Phosphatase: 62 U/L (ref 38–126)
Anion gap: 11 (ref 5–15)
BUN: 13 mg/dL (ref 6–20)
CO2: 19 mmol/L — ABNORMAL LOW (ref 22–32)
Calcium: 9 mg/dL (ref 8.9–10.3)
Chloride: 110 mmol/L (ref 101–111)
Creatinine, Ser: 1.07 mg/dL (ref 0.61–1.24)
GFR calc Af Amer: >60 mL/min (ref >60)
GFR calc non Af Amer: >60 mL/min (ref >60)
Glucose, Bld: 126 mg/dL — ABNORMAL HIGH (ref 65–99)
Potassium: 4 mmol/L (ref 3.5–5.1)
Sodium: 140 mmol/L (ref 135–145)
Total Bilirubin: 1.2 mg/dL (ref 0.3–1.2)
Total Protein: 6.1 g/dL — ABNORMAL LOW (ref 6.5–8.1)

## 2016-11-03 LAB — BLOOD GAS, ARTERIAL
Acid-base deficit: 1.2 mmol/L (ref 0.0–2.0)
Bicarbonate: 22.7 mmol/L (ref 20.0–28.0)
Drawn by: 470591
FIO2: 21
O2 Saturation: 95.2 %
Patient temperature: 98.6
pCO2 arterial: 35.9 mmHg (ref 32.0–48.0)
pH, Arterial: 7.417 (ref 7.350–7.450)
pO2, Arterial: 76.8 mmHg — ABNORMAL LOW (ref 83.0–108.0)

## 2016-11-03 LAB — CBC
HCT: 39.4 % (ref 39.0–52.0)
Hemoglobin: 13.2 g/dL (ref 13.0–17.0)
MCH: 29.3 pg (ref 26.0–34.0)
MCHC: 33.5 g/dL (ref 30.0–36.0)
MCV: 87.6 fL (ref 78.0–100.0)
Platelets: 127 10*3/uL — ABNORMAL LOW (ref 150–400)
RBC: 4.5 MIL/uL (ref 4.22–5.81)
RDW: 14.5 % (ref 11.5–15.5)
WBC: 3.6 10*3/uL — ABNORMAL LOW (ref 4.0–10.5)

## 2016-11-03 LAB — APTT: aPTT: 34 seconds (ref 24–36)

## 2016-11-03 LAB — PULMONARY FUNCTION TEST
DL/VA % pred: 113 %
DL/VA: 5.14 ml/min/mmHg/L
DLCO unc % pred: 98 %
DLCO unc: 30.58 ml/min/mmHg
FEF 25-75 Post: 4.67 L/sec
FEF 25-75 Pre: 3.75 L/sec
FEF2575-%Change-Post: 24 %
FEF2575-%Pred-Post: 196 %
FEF2575-%Pred-Pre: 157 %
FEV1-%Change-Post: 7 %
FEV1-%Pred-Post: 118 %
FEV1-%Pred-Pre: 109 %
FEV1-Post: 3.7 L
FEV1-Pre: 3.44 L
FEV1FVC-%Change-Post: 1 %
FEV1FVC-%Pred-Pre: 113 %
FEV6-%Change-Post: 5 %
FEV6-%Pred-Post: 106 %
FEV6-%Pred-Pre: 101 %
FEV6-Post: 4.3 L
FEV6-Pre: 4.09 L
FEV6FVC-%Change-Post: 0 %
FEV6FVC-%Pred-Post: 105 %
FEV6FVC-%Pred-Pre: 105 %
FVC-%Change-Post: 5 %
FVC-%Pred-Post: 101 %
FVC-%Pred-Pre: 96 %
FVC-Post: 4.34 L
FVC-Pre: 4.11 L
Post FEV1/FVC ratio: 85 %
Post FEV6/FVC ratio: 99 %
Pre FEV1/FVC ratio: 84 %
Pre FEV6/FVC Ratio: 99 %
RV % pred: 98 %
RV: 2.36 L
TLC % pred: 98 %
TLC: 6.75 L

## 2016-11-03 LAB — ECHOCARDIOGRAM COMPLETE
Height: 69 in
Weight: 2960 oz

## 2016-11-03 LAB — HEMOGLOBIN A1C
Hgb A1c MFr Bld: 7.5 % — ABNORMAL HIGH (ref 4.8–5.6)
Mean Plasma Glucose: 168.55 mg/dL

## 2016-11-03 LAB — PLATELET INHIBITION P2Y12

## 2016-11-03 LAB — TYPE AND SCREEN
ABO/RH(D): O POS
Antibody Screen: NEGATIVE

## 2016-11-03 LAB — PROTIME-INR
INR: 1.01
Prothrombin Time: 13.2 seconds (ref 11.4–15.2)

## 2016-11-03 LAB — GLUCOSE, CAPILLARY: Glucose-Capillary: 135 mg/dL — ABNORMAL HIGH (ref 65–99)

## 2016-11-03 MED ORDER — ALBUTEROL SULFATE (2.5 MG/3ML) 0.083% IN NEBU
2.5000 mg | INHALATION_SOLUTION | Freq: Once | RESPIRATORY_TRACT | Status: AC
  Administered 2016-11-03: 2.5 mg via RESPIRATORY_TRACT

## 2016-11-03 NOTE — Progress Notes (Signed)
  Echocardiogram 2D Echocardiogram has been performed.  Jennette Dubin 11/03/2016, 12:55 PM

## 2016-11-03 NOTE — Progress Notes (Signed)
PCP Dr. Mable Paris    Cardio is Dr. Evern Bio 07/2016  Currently denies any cp, sob Checks sugar on daily basis, runs 100 - 180. A1C 05/2016  Was 7.1 EKG was just done 9/14

## 2016-11-03 NOTE — Progress Notes (Signed)
Patient ID: Hunter Mcbride., male   DOB: 03/15/1945, 71 y.o.   MRN: [id number]      Hunter Mcbride       Hunter Mcbride             Hunter Mcbride Date of Birth: Feb 23, 1945  Referring:Dr Hunter Mcbride  Primary Cardiology: Dr Hunter Mcbride  Primary Care: Hunter Infante, MD  Chief Complaint:    Chief Complaint  Patient presents with  . Follow-up    to further discuss scheduled CABG    History of Present Illness:     Patient admitted with unstable angina pain two days ago recently admitted and under went repeat cath. . He has Hunter Mcbride history of recurrent chest pian and previous stent and angioplasties.  In the  spring of 2015 with  history of exertional chest pain he - had abnormal Myoview -At  cathed  DES x 3 to the LAD was done . Otherwise had moderate but nonobstructive disease With a normal EF of 55 to 60%.  In November of 2015 - while  [place] of state.  He developed SSCP that initially resolved but then returned, subsequently associated with SOB and nausea - ended up going to the hospital in [place] , repeat cath done x2 . Had normal left heart pressures, patent stents extending from the proximal to the distal LAD and severe diffuse disease involving the branches of all 3 major epicardial vessels. Was recathed and had FFR of the first OM, posterior descending and distal RCA which were nonsignificant - no PCI was performed. CXR was normal. Echo was also updated - normal EF - mildly dilated aorta noted with mild LVH.   January of 2017 with angina - low risk Myoview noted. Continued on medical management. He has continued to have someepisodes of chest pain.  June of 2017 - he was not doing well - having more angina - ended up getting him recathed. Medical management continued.    Had recurrent CP and underwent repeat cath 06/07/2016 showing triple vessel CAD with severe stenosis of the PL and severe stenosis of a  small to moderate PDA and underwent PTA of the PDA and PCI with DES to the PL.  The mid LAD stent was patent.    He has continued to have upper back burning that he calls his angina along with anterior CP.   Dr. Burt Mcbride noted  continuing to have chronic atypical CP that he states had been very difficult to manage and has been seen multiple times for CP.   Last wee   he came  to the ER with continued complaints of chest pain and burning in his back.    He took 3 SL NTG with some relief for a few minutes but then reocurred.  His sx were associated withfatigue.  Day of admission  he was working out in the yard he had recurrent cp  took a total of 5 SL NTG. Came to ER and was admitted .  Since the patient was seen in the hospital several days ago he's had no further angina but has significantly cut back his physical activity.  Lab Results  Component Value Date   TROPONINI <0.03 10/28/2016     Current Activity/ Functional Status: Patient is independent with mobility/ambulation, transfers, ADL's, IADL's.   Zubrod Score: At the time of surgery this patient's most appropriate activity status/level should be  described as: []     0    Normal activity, no symptoms [x]     1    Restricted in physical strenuous activity but ambulatory, able to do out light work []     2    Ambulatory and capable of self care, unable to do work activities, up and about                 more than 50%  Of the time                            []     3    Only limited self care, in bed greater than 50% of waking hours []     4    Completely disabled, no self care, confined to bed or chair []     5    Moribund  Past Medical History:  Diagnosis Date  . Arthritis    "lower back; right shoulder; left thumb; joints" (06/21/2013)  . Asthma    "not sure if this is true or not" (06/21/2013)  . CAD (coronary artery disease) May 2015   s/p PTCA/DES x 3 to mid LAD, mod non-ob disease in Cx and RCA May 2015; s/p repeat caths - last study  in November of 2015 - stents patent, - FFR - managed medically.   [name] GERD (gastroesophageal reflux disease)   . Gout    "maybe twice in my life"  . Headache(784.0)    "weekly for the last 3-4 months" (06/21/2013)  . Hyperlipidemia   . Hypertension   . Myocardial infarction [facility])    "/Dr. Katharina Mcbride I've had one between 2014-2015) (06/21/2013)  . Osteoarthritis   . Sleep apnea    WEIGHT LOSS, NO LONGER NEEDS PER PATIENT  . Type II diabetes mellitus ([place])    TYPE 2    Past Surgical History:  Procedure Laterality Date  . CARDIAC CATHETERIZATION  1980's   "once"  . CARDIAC CATHETERIZATION N/A 07/23/2015   Procedure: Left Heart Cath and Coronary Angiography;  Surgeon: Hunter Mocha, MD;  Location: [place] [facility];  Service: Cardiovascular;  Laterality: N/A;  . CARDIOVASCULAR STRESS TEST  06/10/2008   EF 68%  . CARPAL TUNNEL RELEASE Bilateral   . CORONARY ANGIOPLASTY WITH STENT PLACEMENT  06/21/2013   "3"  . CORONARY STENT INTERVENTION N/A 06/07/2016   Procedure: Coronary Stent Intervention;  Surgeon: Hunter Blanks, MD;  Location: [place] [facility];  Service: Cardiovascular;  Laterality: N/A;  . EXCISION MORTON'S NEUROMA Left   . EYE SURGERY Left    "removed film over my eye"  . FRACTIONAL FLOW RESERVE WIRE N/A 06/26/2013   Procedure: FRACTIONAL FLOW RESERVE WIRE;  Surgeon: Hunter Hampshire, MD;  Location: [place] CATH [facility];  Service: Cardiovascular;  Laterality: N/A;  . HERNIA REPAIR Left   . INTRAVASCULAR PRESSURE WIRE/FFR STUDY N/A 10/28/2016   Procedure: INTRAVASCULAR PRESSURE WIRE/FFR STUDY;  Surgeon: Hunter Man, MD;  Location: [place] [facility];  Service: Cardiovascular;  Laterality: N/A;  . JOINT REPLACEMENT Left 03/2013   "thumb"  . LEFT HEART CATH AND CORONARY ANGIOGRAPHY N/A 06/07/2016   Procedure: Left Heart Cath and Coronary Angiography;  Surgeon: Hunter Blanks, MD;  Location: [place] [facility];  Service: Cardiovascular;  Laterality: N/A;  . LEFT HEART CATH AND  CORONARY ANGIOGRAPHY N/A 10/28/2016   Procedure: LEFT HEART CATH AND CORONARY ANGIOGRAPHY;  Surgeon: Hunter Man, MD;  Location: [place]  [facility];  Service: Cardiovascular;  Laterality: N/A;  . LEFT HEART CATHETERIZATION WITH CORONARY ANGIOGRAM N/A 06/21/2013   Procedure: LEFT HEART CATHETERIZATION WITH CORONARY ANGIOGRAM;  Surgeon: Hunter Blanks, MD;  Location: [facility] C[facility];  Service: Cardiovascular;  Laterality: N/A;  . LEFT HEART CATHETERIZATION WITH CORONARY ANGIOGRAM N/A 06/26/2013   Procedure: LEFT HEART CATHETERIZATION WITH CORONARY ANGIOGRAM;  Surgeon: Hunter Hampshire, MD;  Location: [place] CATH LAB;  Service: Cardiovascular;  Laterality: N/A;  . TONSILLECTOMY    . TOTAL HIP ARTHROPLASTY Right 04/12/2016   Procedure: RIGHT TOTAL HIP ARTHROPLASTY ANTERIOR APPROACH;  Surgeon: Paralee Cancel, MD;  Location: [facility];  Service: Orthopedics;  Laterality: Right;  requests 70 mins  . TOTAL SHOULDER ARTHROPLASTY  03/01/2012   Procedure: TOTAL SHOULDER ARTHROPLASTY;  Surgeon: Marin Shutter, MD;  Location: [place];  Service: Orthopedics;  Laterality: Right;  . UMBILICAL HERNIA REPAIR      History  Smoking Status  . Never Smoker  Smokeless Tobacco  . Never Used    History  Alcohol Use No    Social History   Social History  . Marital status: Married    Spouse name: N/A  . Number of children: N/A  . Years of education: N/A   Occupational History  . Not on file.   Social History Main Topics  . Smoking status: Never Smoker  . Smokeless tobacco: Never Used  . Alcohol use No  . Drug use: No  . Sexual activity: Yes   Other Topics Concern  . Not on file   Social History Narrative  . No narrative on file    No Known Allergies  Current Outpatient Prescriptions  Medication Sig Dispense Refill  . acetaminophen (TYLENOL) 500 MG tablet Take 1,000 mg by mouth every 8 (eight) hours as needed (pain).     [name] ALPRAZolam (XANAX) 0.5 MG tablet Take 0.5 mg by mouth daily as needed for  anxiety.     [name] amLODipine (NORVASC) 5 MG tablet Take 0.5 tablets (2.5 mg total) by mouth daily.    [name] aspirin 81 MG tablet Take 81 mg by mouth at bedtime.     [name] atenolol (TENORMIN) 50 MG tablet Take 1 tablet (50 mg total) by mouth at bedtime. 90 tablet 3  . atorvastatin (LIPITOR) 80 MG tablet Take 80 mg by mouth daily at 6 PM.    . canagliflozin (INVOKANA) 300 MG TABS tablet Take 300 mg by mouth daily before breakfast.    . Cholecalciferol (VITAMIN D PO) Take 1 capsule by mouth daily.    [name] docusate sodium (COLACE) 100 MG capsule Take 100 mg by mouth daily as needed for mild constipation.    . gabapentin (NEURONTIN) 300 MG capsule Take 300 mg by mouth at bedtime.     . isosorbide mononitrate (IMDUR) 30 MG 24 hr tablet Take 2 tablets (60 mg total) by mouth daily.    . metFORMIN (GLUCOPHAGE-XR) 500 MG 24 hr tablet Take 2 tablets (1,000 mg total) by mouth 2 (two) times daily.    . nitroGLYCERIN (NITROSTAT) 0.4 MG SL tablet Place 1 tablet (0.4 mg total) under the tongue every 5 (five) minutes as needed. For chest pain 25 tablet 5  . pantoprazole (PROTONIX) 40 MG tablet Take 40 mg by mouth 2 (two) times daily.    . polyethylene glycol (MIRALAX / GLYCOLAX) packet Take 17 g by mouth daily as needed for mild constipation.    . ranolazine (RANEXA) 500 MG 12 hr tablet Take 1  tablet (500 mg total) by mouth 2 (two) times daily. 180 tablet 3  . traMADol (ULTRAM) 50 MG tablet Take 1-2 tablets (50-100 mg total) by mouth every 6 (six) hours as needed (pain). 30 tablet 0  . clopidogrel (PLAVIX) 75 MG tablet Take 1 tablet (75 mg total) by mouth daily with breakfast. (Patient not taking: Reported on 11/03/2016) 90 tablet 3   No current facility-administered medications for this visit.      (Not in a hospital admission)  Family History  Problem Relation Age of Onset  . Diabetes type II Mother   . Heart attack Father   . Coronary artery disease Cousin   . Heart disease Brother      Review of Systems:    Pertinent items are noted in HPI.     Cardiac Review of Systems: Y or N  Chest Pain [ y   ]  Resting SOB [   n] Exertional SOB  [ y ]  Orthopnea [ n ]   Pedal Edema [ n  ]    Palpitations [n ] Syncope  [ n ]   Presyncope [ n  ]  General Review of Systems: [Y] = yes [  ]=no Constitional: recent weight change [ n ]; anorexia [  ]; fatigue [ y ]; nausea [  ]; night sweats [  ]; fever [  ]; or chills [  ]                                                               Dental: poor dentition[  ]; Last Dentist visit:   Eye : blurred vision [  ]; diplopia [   ]; vision changes [  ];  Amaurosis fugax[  ]; Resp: cough [ n ];  wheezing[n  ];  hemoptysis[n  ]; shortness of breath[ y ]; paroxysmal nocturnal dyspnea[ y ]; dyspnea on exertion[  ]; or orthopnea[  ];  GI:  gallstones[  ], vomiting[  ];  dysphagia[  ]; melena[  ];  hematochezia [  ]; heartburn[  ];   Hx of  Colonoscopy[  ]; GU: kidney stones [  ]; hematuria[  ];   dysuria [  ];  nocturia[  ];  history of     obstruction [  ]; urinary frequency [  ]             Skin: rash, swelling[  ];, hair loss[  ];  peripheral edema[  ];  or itching[  ]; Musculosketetal: myalgias[  ];  joint swelling[  ];  joint erythema[  ];  joint pain[  ];  back pain[ y ];  Heme/Lymph: bruising[  ];  bleeding[  ];  anemia[  ];  Neuro: TIA[  ];  headaches[  ];  stroke[  ];  vertigo[  ];  seizures[  ];   paresthesias[  ];  difficulty walking[ n ];  Psych:depression[  ]; anxiety[  ];  Endocrine: diabetes[  ];  thyroid dysfunction[  ];  Immunizations: Flu [ n ]; Pneumococcal[n  ];  Other:  Physical Exam: BP 133/80 (BP Location: Right Arm, Patient Position: Sitting, Cuff Size: Large)   Pulse 63   Resp 16   Ht 5\' 9"  (1.753 m)   Wt 185 lb (83.9 kg)  SpO2 97% Comment: ON RA  BMI 27.32 kg/m    General appearance: alert, cooperative, appears stated age and no distress Head: Normocephalic, without obvious abnormality, atraumatic Neck: no adenopathy, no carotid bruit,  no JVD, supple, symmetrical, trachea midline and thyroid not enlarged, symmetric, no tenderness/mass/nodules Lymph nodes: Cervical, supraclavicular, and axillary nodes normal. Resp: clear to auscultation bilaterally Back: symmetric, no curvature. ROM normal. No CVA tenderness. Cardio: regular rate and rhythm, S1, S2 normal, no murmur, click, rub or gallop GI: soft, non-tender; bowel sounds normal; no masses,  no organomegaly Extremities: extremities normal, atraumatic, no cyanosis or edema and Homans sign is negative, no sign of DVT Neurologic: Grossly normal  Diagnostic Studies & Laboratory data:     Recent Radiology Findings:  Dg Chest 2 View  Result Date: 10/27/2016 CLINICAL DATA:  chest pain. Pt has had cp since yesterday. He said it happens off and on a lot but, has been bad since yesterday. He feels pain in his left side chest from left nipple to center of chest. He said it will also hurt straight through into his back as well. Pt mentioned usually he'll feel the pain start in his back first, then into his chest. Pt explained he has experienced a lot of trouble with his heart in the past. Hx of CAD, HTN, MI, DM2, pt has had heart numerous heart catheterizations and stents EXAM: CHEST  2 VIEW COMPARISON:  07/08/2016 FINDINGS: Cardiac silhouette is normal in size and configuration. No mediastinal or hilar masses. No evidence of adenopathy. Clear lungs. No pleural effusion or pneumothorax. Right shoulder prosthesis, partly imaged appears well-seated and aligned. IMPRESSION: 1. No active cardiopulmonary disease. Electronically Signed   By: Lajean Manes M.D.   On: 10/27/2016 19:50    I have independently reviewed the above radiologic studies.  Recent Lab Findings: Lab Results  Component Value Date   WBC 4.3 10/28/2016   HGB 12.9 (L) 10/28/2016   HCT 37.9 (L) 10/28/2016   PLT 125 (L) 10/28/2016   GLUCOSE 130 (H) 10/28/2016   CHOL 84 10/28/2016   TRIG 31 10/28/2016   HDL 36 (L) 10/28/2016    LDLCALC 42 10/28/2016   ALT 15 (L) 06/06/2016   AST 30 06/06/2016   NA 137 10/28/2016   K 3.5 10/28/2016   CL 107 10/28/2016   CREATININE 0.95 10/28/2016   BUN 17 10/28/2016   CO2 24 10/28/2016   TSH 2.009 06/06/2016   INR 1.20 10/28/2016   HGBA1C 7.1 (H) 06/06/2016   CATH: Procedures   INTRAVASCULAR PRESSURE WIRE/FFR STUDY  LEFT HEART CATH AND CORONARY ANGIOGRAPHY  Conclusion     Ost LM lesion, 40 %stenosed.  Mid Cx lesion, 60 %stenosed. Likely not flow-limiting  Dist LAD lesion, 40 %stenosed. Distal to stent segment  1st Diag lesion, 80 %stenosed. Relatively small caliber vessel  Ost RPDA to RPDA lesion, 40 %stenosed.  RPDA lesion, 85 %stenosed.  2nd Diag lesion, 70 %stenosed. Relatively small caliber  Proximal and mid RCA 30-40% lesions with mild calcification.  1st Mrg lesion, 75 %stenosed. FFR 0.8 - significant  Dist RCA Synergy DES from April 2018, 0 %stenosed.  Prox LAD to Dist LAD stented segment (3 overlapping DES), ~ diffuse 50 %stenosed. FFR post stent 0.73, mid stent 0.76, pre stent 0.82  The left ventricular ejection fraction is 55-65% by visual estimate.   Angiographically stable coronary arteries but with FFR significance in the LAD and OM1 suggesting progression of intraluminal disease  Due to the extent of disease  segment in the LAD, likely not a good redo PCI target. I discussed with Dr. Burt Mcbride, he feels strongly that the best option is for the patient be referred for coronary artery bypass surgery, and I agree.  He'll be transferred back to his unit for ongoing care with TR band removal.  Recommendation upper discussion with Dr. Burt Mcbride: The patient does not have critical coronary artery disease stenosis, and really has no significant disease change from before. Some of his symptoms may be atypical in nature and eventually nonanginal however he still does have classic anginal symptoms.  He has no acute requirement for inpatient  monitoring pending bypass evaluation. We feel it safe for him to be discharged with pain management plan (with likely recommend narcotic pain management for short-term) and follow-up with Dr. Servando Snare early next week.  Would otherwise continue his other antianginal regimen.  I have discussed scheduling with CT surgery. It is unlikely that they will go see him as an inpatient and therefore would be preferable to set up outpatient evaluation. I would discontinue Plavix with the potential of possible CABG early next week. If this would not be possible, then would continue Plavix until's 5 days pre-op.   Glenetta Hew, M.D., M.S.   I have independently reviewed the above  cath films and reviewed the findings with the  patient .   Previous cath: Procedures   Coronary Stent Intervention  Left Heart Cath and Coronary Angiography  Conclusion     RPDA lesion, 75 %stenosed.  Ost LM lesion, 40 %stenosed.  1st Mrg lesion, 60 %stenosed.  Prox LAD to Dist LAD lesion, 10 %stenosed.  Dist LAD lesion, 40 %stenosed.  2nd Diag lesion, 80 %stenosed.  1st Diag lesion, 80 %stenosed.  Dist RCA lesion, 90 %stenosed.  Ost RPDA to RPDA lesion, 95 %stenosed.  Mid RCA lesion, 40 %stenosed.  Prox RCA to Mid RCA lesion, 40 %stenosed.  Mid Cx lesion, 60 %stenosed.  The left ventricular systolic function is normal.  LV end diastolic pressure is normal.  The left ventricular ejection fraction is 55-65% by visual estimate.  There is no mitral valve regurgitation.   1. Triple vessel CAD 2. Severe stenosis ostium of the moderate caliber posterolateral branch. Successful PTCA/DES x 1 proximal posterolateral artery.  3. Severe stenosis small to moderate caliber PDA. Successful PTCA with balloon angioplasty only proximal PDA.  4. Patent stent mid LAD.  5. Diffuse disease in the small caliber diagonal branches.  6. Normal LV systolic function 7. Mild left main stenosis.   Recommendations:  Continue DAPT for at least one year. Continue statin, beta blocker, Norvasc and Ranexa.     I have independently reviewed the above  cath films and reviewed the findings with the  patient .     Assessment / Plan:    Diffuse Coronary Artery Disease with unstable angina on admission. Films have been reviewed, he has CADdisease but unclear what is the culprit lesion. I reviewed the cath films with Dr. Burt Mcbride we both agree with the patient's continued anginal symptoms, positive FFR, proceeding with coronary artery bypass grafting has the greatest chance of relief of symptoms for the patient. Risks and options of surgery have been reviewed with the patient and his family in detail including reviewing the cine films with them. We will of retained patient's preop laboratories today, including an echocardiogram since he has not had one for 3-4 years. We'll plan to proceed with surgery on September 24.    Grace Isaac MD  [facility] [facility] [place] [address] Office [phone]   Beeper [phone]  11/03/2016 9:35 AM

## 2016-11-04 ENCOUNTER — Other Ambulatory Visit: Payer: Self-pay | Admitting: *Deleted

## 2016-11-04 DIAGNOSIS — I2510 Atherosclerotic heart disease of native coronary artery without angina pectoris: Secondary | ICD-10-CM

## 2016-11-04 NOTE — Progress Notes (Signed)
Anesthesia chart review: Patient is a 71 year old male scheduled for CABG on 11/07/2016 by Dr. Lanelle Bal.  History includes CAD/MI (s/p PTCA/DES X 3 mid LAD 06/21/13), DM2, never smoker, hyperlipidemia, hypertension, GERD, gout, arthritis, OSA (no CPAP since weight loss), asthma, tonsillectomy, hernia repair, right total shoulder arthroplasty '14, right THA 04/02/16.  PCP is Dr. Crist Infante. Cardiologist is Dr. Sherren Mocha.  Meds include Plavix (instructed to stop 11/02/17 by Dr. Servando Snare), Xanax, amlodipine, ASA 81 mg, atenolol, Lipitor, Invokana, Neurontin, Imdur, metformin, Nitro, Protonix, Ranexa, tramadol.   BP 132/75   Pulse (!) 57   Temp 36.4 C   Resp 18   Ht 5\' 9"  (1.753 m)   Wt 187 lb 8 oz (85 kg)   SpO2 100%   BMI 27.69 kg/m    Cardiac cath 10/28/16:  Ost LM lesion, 40 %stenosed.  Mid Cx lesion, 60 %stenosed. Likely not flow-limiting  Dist LAD lesion, 40 %stenosed. Distal to stent segment  1st Diag lesion, 80 %stenosed. Relatively small caliber vessel  Ost RPDA to RPDA lesion, 40 %stenosed.  RPDA lesion, 85 %stenosed.  2nd Diag lesion, 70 %stenosed. Relatively small caliber  Proximal and mid RCA 30-40% lesions with mild calcification.  1st Mrg lesion, 75 %stenosed. FFR 0.8 - significant  Dist RCA Synergy DES from April 2018, 0 %stenosed.  Prox LAD to Dist LAD stented segment (3 overlapping DES), ~ diffuse 50 %stenosed. FFR post stent 0.73, mid stent 0.76, pre stent 0.82  The left ventricular ejection fraction is 55-65% by visual estimate. Angiographically stable coronary arteries but with FFR significance in the LAD and OM1 suggesting progression of intraluminal disease. Due to the extent of disease segment in the LAD, likely not a good redo PCI target. I discussed with Dr. Burt Knack, he feels strongly that the best option is for the patient be referred for coronary artery bypass surgery, and I agree.  Echo 11/03/16: Study Conclusions - Left ventricle: The  cavity size was normal. Systolic function was   normal. The estimated ejection fraction was in the range of 60%   to 65%. Wall motion was normal; there were no regional wall   motion abnormalities. Doppler parameters are consistent with   abnormal left ventricular relaxation (grade 1 diastolic   dysfunction). There was no evidence of elevated ventricular   filling pressure by Doppler parameters. - Aortic valve: There was no regurgitation. - Aortic root: The aortic root was normal in size. - Mitral valve: There was trivial regurgitation. - Left atrium: The atrium was normal in size. - Right ventricle: Systolic function was normal. - Tricuspid valve: There was mild regurgitation. - Pulmonic valve: There was trivial regurgitation. - Pulmonary arteries: Systolic pressure was within the normal   range. - Inferior vena cava: The vessel was normal in size. - Pericardium, extracardiac: There was no pericardial effusion.  Carotid U/S 10/28/16: Summary: - The vertebral arteries appear patent with antegrade flow. - Findings consistent with a 1-39 percent stenosis involving the   right internal carotid artery and the left internal carotid   artery. Tortuous distal ICA&'s bialterally.  Preoperative EKG and CXR reviewed.  ABG 11/03/16: FVC 4.11 (96%), FEV1 3.44 (109%), DLCO unc  30.58 (98%).  Preoperative labs noted. Cr 1.07. WBC 3.6, PLT 127K.  H/H, PT/PTT WNL. P2y12 could not be resulted due to "interfering substance." A1c 7.5. TCTS RN Ryan notified that p2y12 could not be resulted. She reviewed with Dr. Servando Snare to ensure not needed before the day of surgery--okay to get p2y12  STAT morning of surgery.  [name] Short Stay Center/Anesthesiology Phone [phone] 11/04/2016 12:22 PM

## 2016-11-06 MED ORDER — POTASSIUM CHLORIDE 2 MEQ/ML IV SOLN
80.0000 meq | INTRAVENOUS | Status: DC
  Filled 2016-11-06: qty 40

## 2016-11-06 MED ORDER — CHLORHEXIDINE GLUCONATE 0.12 % MT SOLN
15.0000 mL | Freq: Once | OROMUCOSAL | Status: AC
  Administered 2016-11-07: 15 mL via OROMUCOSAL
  Filled 2016-11-06: qty 15

## 2016-11-06 MED ORDER — SODIUM CHLORIDE 0.9 % IV SOLN
INTRAVENOUS | Status: DC
  Filled 2016-11-06: qty 30

## 2016-11-06 MED ORDER — DEXTROSE 5 % IV SOLN
1.5000 g | INTRAVENOUS | Status: AC
  Administered 2016-11-07: 1.5 g via INTRAVENOUS
  Filled 2016-11-06 (×2): qty 1.5

## 2016-11-06 MED ORDER — DOPAMINE-DEXTROSE 3.2-5 MG/ML-% IV SOLN
0.0000 ug/kg/min | INTRAVENOUS | Status: DC
  Filled 2016-11-06: qty 250

## 2016-11-06 MED ORDER — TRANEXAMIC ACID 1000 MG/10ML IV SOLN
1.5000 mg/kg/h | INTRAVENOUS | Status: AC
  Administered 2016-11-07: 1.5 mg/kg/h via INTRAVENOUS
  Filled 2016-11-06: qty 25

## 2016-11-06 MED ORDER — TRANEXAMIC ACID (OHS) PUMP PRIME SOLUTION
2.0000 mg/kg | INTRAVENOUS | Status: DC
  Filled 2016-11-06: qty 1.7

## 2016-11-06 MED ORDER — DEXMEDETOMIDINE HCL IN NACL 400 MCG/100ML IV SOLN
0.1000 ug/kg/h | INTRAVENOUS | Status: AC
  Administered 2016-11-07: .3 ug/kg/h via INTRAVENOUS
  Filled 2016-11-06: qty 100

## 2016-11-06 MED ORDER — SODIUM CHLORIDE 0.9 % IV SOLN
30.0000 ug/min | INTRAVENOUS | Status: AC
  Administered 2016-11-07: 20 ug/min via INTRAVENOUS
  Filled 2016-11-06: qty 2

## 2016-11-06 MED ORDER — PLASMA-LYTE 148 IV SOLN
INTRAVENOUS | Status: AC
  Administered 2016-11-07: 500 mL
  Filled 2016-11-06: qty 2.5

## 2016-11-06 MED ORDER — TRANEXAMIC ACID (OHS) BOLUS VIA INFUSION
15.0000 mg/kg | INTRAVENOUS | Status: AC
  Administered 2016-11-07: 1275 mg via INTRAVENOUS
  Filled 2016-11-06: qty 1275

## 2016-11-06 MED ORDER — NITROGLYCERIN IN D5W 200-5 MCG/ML-% IV SOLN
2.0000 ug/min | INTRAVENOUS | Status: AC
  Administered 2016-11-07: 16.7 ug/min via INTRAVENOUS
  Filled 2016-11-06: qty 250

## 2016-11-06 MED ORDER — EPINEPHRINE PF 1 MG/ML IJ SOLN
0.0000 ug/min | INTRAVENOUS | Status: DC
  Filled 2016-11-06: qty 4

## 2016-11-06 MED ORDER — VANCOMYCIN HCL 10 G IV SOLR
1500.0000 mg | INTRAVENOUS | Status: AC
  Administered 2016-11-07: 1500 mg via INTRAVENOUS
  Filled 2016-11-06: qty 1500

## 2016-11-06 MED ORDER — MAGNESIUM SULFATE 50 % IJ SOLN
40.0000 meq | INTRAMUSCULAR | Status: DC
  Filled 2016-11-06: qty 10

## 2016-11-06 MED ORDER — METOPROLOL TARTRATE 12.5 MG HALF TABLET: 12.5000 mg | ORAL_TABLET | Freq: Once | ORAL | Status: DC

## 2016-11-06 MED ORDER — SODIUM CHLORIDE 0.9 % IV SOLN
INTRAVENOUS | Status: AC
  Administered 2016-11-07: 1.5 [IU]/h via INTRAVENOUS
  Filled 2016-11-06: qty 1

## 2016-11-06 MED ORDER — DEXTROSE 5 % IV SOLN
750.0000 mg | INTRAVENOUS | Status: DC
  Filled 2016-11-06: qty 750

## 2016-11-07 ENCOUNTER — Inpatient Hospital Stay (HOSPITAL_COMMUNITY)
Admission: RE | Admit: 2016-11-07 | Discharge: 2016-11-12 | DRG: 236 | Disposition: A | Discharge status: Home / Self Care | Payer: PPO | Source: Ambulatory Visit | Attending: Cardiothoracic Surgery | Admitting: Cardiothoracic Surgery

## 2016-11-07 ENCOUNTER — Inpatient Hospital Stay (HOSPITAL_COMMUNITY): Payer: PPO | Admitting: Anesthesiology

## 2016-11-07 ENCOUNTER — Inpatient Hospital Stay (HOSPITAL_COMMUNITY): Payer: PPO | Admitting: Vascular Surgery

## 2016-11-07 ENCOUNTER — Inpatient Hospital Stay (HOSPITAL_COMMUNITY)
Admission: RE | Disposition: A | Discharge status: Home / Self Care | Payer: Self-pay | Source: Ambulatory Visit | Attending: Cardiothoracic Surgery

## 2016-11-07 ENCOUNTER — Inpatient Hospital Stay (HOSPITAL_COMMUNITY): Payer: PPO

## 2016-11-07 ENCOUNTER — Encounter (HOSPITAL_COMMUNITY): Payer: Self-pay | Admitting: *Deleted

## 2016-11-07 DIAGNOSIS — J90 Pleural effusion, not elsewhere classified: Secondary | ICD-10-CM | POA: Diagnosis not present

## 2016-11-07 DIAGNOSIS — Z96641 Presence of right artificial hip joint: Secondary | ICD-10-CM | POA: Diagnosis not present

## 2016-11-07 DIAGNOSIS — Z7982 Long term (current) use of aspirin: Secondary | ICD-10-CM | POA: Diagnosis not present

## 2016-11-07 DIAGNOSIS — G4730 Sleep apnea, unspecified: Secondary | ICD-10-CM | POA: Diagnosis present

## 2016-11-07 DIAGNOSIS — I493 Ventricular premature depolarization: Secondary | ICD-10-CM | POA: Diagnosis present

## 2016-11-07 DIAGNOSIS — I25110 Atherosclerotic heart disease of native coronary artery with unstable angina pectoris: Secondary | ICD-10-CM | POA: Diagnosis not present

## 2016-11-07 DIAGNOSIS — I080 Rheumatic disorders of both mitral and aortic valves: Secondary | ICD-10-CM | POA: Diagnosis not present

## 2016-11-07 DIAGNOSIS — K219 Gastro-esophageal reflux disease without esophagitis: Secondary | ICD-10-CM | POA: Diagnosis present

## 2016-11-07 DIAGNOSIS — Z4682 Encounter for fitting and adjustment of non-vascular catheter: Secondary | ICD-10-CM | POA: Diagnosis not present

## 2016-11-07 DIAGNOSIS — J9811 Atelectasis: Secondary | ICD-10-CM | POA: Diagnosis not present

## 2016-11-07 DIAGNOSIS — Z955 Presence of coronary angioplasty implant and graft: Secondary | ICD-10-CM

## 2016-11-07 DIAGNOSIS — Z23 Encounter for immunization: Secondary | ICD-10-CM

## 2016-11-07 DIAGNOSIS — Z09 Encounter for follow-up examination after completed treatment for conditions other than malignant neoplasm: Secondary | ICD-10-CM

## 2016-11-07 DIAGNOSIS — E119 Type 2 diabetes mellitus without complications: Secondary | ICD-10-CM | POA: Diagnosis not present

## 2016-11-07 DIAGNOSIS — I252 Old myocardial infarction: Secondary | ICD-10-CM | POA: Diagnosis not present

## 2016-11-07 DIAGNOSIS — Z833 Family history of diabetes mellitus: Secondary | ICD-10-CM

## 2016-11-07 DIAGNOSIS — Z79899 Other long term (current) drug therapy: Secondary | ICD-10-CM | POA: Diagnosis not present

## 2016-11-07 DIAGNOSIS — M1990 Unspecified osteoarthritis, unspecified site: Secondary | ICD-10-CM | POA: Diagnosis not present

## 2016-11-07 DIAGNOSIS — Z96692 Finger-joint replacement of left hand: Secondary | ICD-10-CM | POA: Diagnosis present

## 2016-11-07 DIAGNOSIS — Z7984 Long term (current) use of oral hypoglycemic drugs: Secondary | ICD-10-CM

## 2016-11-07 DIAGNOSIS — I10 Essential (primary) hypertension: Secondary | ICD-10-CM | POA: Diagnosis present

## 2016-11-07 DIAGNOSIS — D62 Acute posthemorrhagic anemia: Secondary | ICD-10-CM | POA: Diagnosis not present

## 2016-11-07 DIAGNOSIS — Z8249 Family history of ischemic heart disease and other diseases of the circulatory system: Secondary | ICD-10-CM | POA: Diagnosis not present

## 2016-11-07 DIAGNOSIS — E8770 Fluid overload, unspecified: Secondary | ICD-10-CM | POA: Diagnosis present

## 2016-11-07 DIAGNOSIS — I77819 Aortic ectasia, unspecified site: Secondary | ICD-10-CM | POA: Diagnosis not present

## 2016-11-07 DIAGNOSIS — Z951 Presence of aortocoronary bypass graft: Secondary | ICD-10-CM

## 2016-11-07 DIAGNOSIS — Z9889 Other specified postprocedural states: Secondary | ICD-10-CM

## 2016-11-07 DIAGNOSIS — I2510 Atherosclerotic heart disease of native coronary artery without angina pectoris: Secondary | ICD-10-CM

## 2016-11-07 HISTORY — PX: CORONARY ARTERY BYPASS GRAFT: SHX141

## 2016-11-07 HISTORY — PX: TEE WITHOUT CARDIOVERSION: SHX5443

## 2016-11-07 LAB — CBC
HCT: 31.4 % — ABNORMAL LOW (ref 39.0–52.0)
HCT: 31.3 % — ABNORMAL LOW (ref 39.0–52.0)
Hemoglobin: 10.8 g/dL — ABNORMAL LOW (ref 13.0–17.0)
Hemoglobin: 10.6 g/dL — ABNORMAL LOW (ref 13.0–17.0)
MCH: 29.7 pg (ref 26.0–34.0)
MCH: 29.7 pg (ref 26.0–34.0)
MCHC: 34.4 g/dL (ref 30.0–36.0)
MCHC: 33.9 g/dL (ref 30.0–36.0)
MCV: 86.3 fL (ref 78.0–100.0)
MCV: 87.7 fL (ref 78.0–100.0)
PLATELETS: 81 10*3/uL — AB (ref 150–400)
Platelets: 94 10*3/uL — ABNORMAL LOW (ref 150–400)
RBC: 3.64 MIL/uL — AB (ref 4.22–5.81)
RBC: 3.57 MIL/uL — ABNORMAL LOW (ref 4.22–5.81)
RDW: 13.9 % (ref 11.5–15.5)
RDW: 14.3 % (ref 11.5–15.5)
WBC: 5.6 10*3/uL (ref 4.0–10.5)
WBC: 5.2 10*3/uL (ref 4.0–10.5)

## 2016-11-07 LAB — PROTIME-INR
INR: 1.51
Prothrombin Time: 18.1 seconds — ABNORMAL HIGH (ref 11.4–15.2)

## 2016-11-07 LAB — POCT I-STAT 4, (NA,K, GLUC, HGB,HCT)
Glucose, Bld: 102 mg/dL — ABNORMAL HIGH (ref 65–99)
HCT: 27 % — ABNORMAL LOW (ref 39.0–52.0)
Hemoglobin: 9.2 g/dL — ABNORMAL LOW (ref 13.0–17.0)
Potassium: 3.4 mmol/L — ABNORMAL LOW (ref 3.5–5.1)
Sodium: 142 mmol/L (ref 135–145)

## 2016-11-07 LAB — POCT I-STAT 3, ART BLOOD GAS (G3+)
Acid-Base Excess: 2 mmol/L (ref 0.0–2.0)
Acid-base deficit: 4 mmol/L — ABNORMAL HIGH (ref 0.0–2.0)
Acid-base deficit: 4 mmol/L — ABNORMAL HIGH (ref 0.0–2.0)
Bicarbonate: 26.3 mmol/L (ref 20.0–28.0)
Bicarbonate: 23.8 mmol/L (ref 20.0–28.0)
Bicarbonate: 22.2 mmol/L (ref 20.0–28.0)
Bicarbonate: 22.5 mmol/L (ref 20.0–28.0)
O2 Saturation: 100 %
O2 Saturation: 99 %
O2 Saturation: 95 %
O2 Saturation: 96 %
Patient temperature: 35.6
Patient temperature: 37.4
Patient temperature: 38.1
TCO2: 28 mmol/L (ref 22–32)
TCO2: 25 mmol/L (ref 22–32)
TCO2: 24 mmol/L (ref 22–32)
TCO2: 24 mmol/L (ref 22–32)
pCO2 arterial: 39.6 mmHg (ref 32.0–48.0)
pCO2 arterial: 33.8 mmHg (ref 32.0–48.0)
pCO2 arterial: 47.1 mmHg (ref 32.0–48.0)
pCO2 arterial: 47.4 mmHg (ref 32.0–48.0)
pH, Arterial: 7.431 (ref 7.350–7.450)
pH, Arterial: 7.45 (ref 7.350–7.450)
pH, Arterial: 7.284 — ABNORMAL LOW (ref 7.350–7.450)
pH, Arterial: 7.29 — ABNORMAL LOW (ref 7.350–7.450)
pO2, Arterial: 366 mmHg — ABNORMAL HIGH (ref 83.0–108.0)
pO2, Arterial: 123 mmHg — ABNORMAL HIGH (ref 83.0–108.0)
pO2, Arterial: 86 mmHg (ref 83.0–108.0)
pO2, Arterial: 97 mmHg (ref 83.0–108.0)

## 2016-11-07 LAB — POCT I-STAT, CHEM 8
BUN: 11 mg/dL (ref 6–20)
BUN: 10 mg/dL (ref 6–20)
BUN: 9 mg/dL (ref 6–20)
BUN: 8 mg/dL (ref 6–20)
BUN: 8 mg/dL (ref 6–20)
Calcium, Ion: 1.21 mmol/L (ref 1.15–1.40)
Calcium, Ion: 1.24 mmol/L (ref 1.15–1.40)
Calcium, Ion: 1.05 mmol/L — ABNORMAL LOW (ref 1.15–1.40)
Calcium, Ion: 1.1 mmol/L — ABNORMAL LOW (ref 1.15–1.40)
Calcium, Ion: 1.17 mmol/L (ref 1.15–1.40)
Chloride: 105 mmol/L (ref 101–111)
Chloride: 105 mmol/L (ref 101–111)
Chloride: 100 mmol/L — ABNORMAL LOW (ref 101–111)
Chloride: 103 mmol/L (ref 101–111)
Chloride: 107 mmol/L (ref 101–111)
Creatinine, Ser: 0.6 mg/dL — ABNORMAL LOW (ref 0.61–1.24)
Creatinine, Ser: 0.5 mg/dL — ABNORMAL LOW (ref 0.61–1.24)
Creatinine, Ser: 0.5 mg/dL — ABNORMAL LOW (ref 0.61–1.24)
Creatinine, Ser: 0.6 mg/dL — ABNORMAL LOW (ref 0.61–1.24)
Creatinine, Ser: 0.6 mg/dL — ABNORMAL LOW (ref 0.61–1.24)
Glucose, Bld: 136 mg/dL — ABNORMAL HIGH (ref 65–99)
Glucose, Bld: 131 mg/dL — ABNORMAL HIGH (ref 65–99)
Glucose, Bld: 111 mg/dL — ABNORMAL HIGH (ref 65–99)
Glucose, Bld: 133 mg/dL — ABNORMAL HIGH (ref 65–99)
Glucose, Bld: 121 mg/dL — ABNORMAL HIGH (ref 65–99)
HCT: 32 % — ABNORMAL LOW (ref 39.0–52.0)
HCT: 29 % — ABNORMAL LOW (ref 39.0–52.0)
HCT: 27 % — ABNORMAL LOW (ref 39.0–52.0)
HCT: 27 % — ABNORMAL LOW (ref 39.0–52.0)
HCT: 27 % — ABNORMAL LOW (ref 39.0–52.0)
Hemoglobin: 10.9 g/dL — ABNORMAL LOW (ref 13.0–17.0)
Hemoglobin: 9.9 g/dL — ABNORMAL LOW (ref 13.0–17.0)
Hemoglobin: 9.2 g/dL — ABNORMAL LOW (ref 13.0–17.0)
Hemoglobin: 9.2 g/dL — ABNORMAL LOW (ref 13.0–17.0)
Hemoglobin: 9.2 g/dL — ABNORMAL LOW (ref 13.0–17.0)
Potassium: 3.8 mmol/L (ref 3.5–5.1)
Potassium: 3.8 mmol/L (ref 3.5–5.1)
Potassium: 3.8 mmol/L (ref 3.5–5.1)
Potassium: 3.7 mmol/L (ref 3.5–5.1)
Potassium: 4.2 mmol/L (ref 3.5–5.1)
Sodium: 140 mmol/L (ref 135–145)
Sodium: 139 mmol/L (ref 135–145)
Sodium: 139 mmol/L (ref 135–145)
Sodium: 139 mmol/L (ref 135–145)
Sodium: 139 mmol/L (ref 135–145)
TCO2: 28 mmol/L (ref 22–32)
TCO2: 28 mmol/L (ref 22–32)
TCO2: 26 mmol/L (ref 22–32)
TCO2: 25 mmol/L (ref 22–32)
TCO2: 23 mmol/L (ref 22–32)

## 2016-11-07 LAB — GLUCOSE, CAPILLARY
Glucose-Capillary: 132 mg/dL — ABNORMAL HIGH (ref 65–99)
Glucose-Capillary: 85 mg/dL (ref 65–99)
Glucose-Capillary: 103 mg/dL — ABNORMAL HIGH (ref 65–99)
Glucose-Capillary: 125 mg/dL — ABNORMAL HIGH (ref 65–99)
Glucose-Capillary: 115 mg/dL — ABNORMAL HIGH (ref 65–99)
Glucose-Capillary: 120 mg/dL — ABNORMAL HIGH (ref 65–99)
Glucose-Capillary: 142 mg/dL — ABNORMAL HIGH (ref 65–99)
Glucose-Capillary: 118 mg/dL — ABNORMAL HIGH (ref 65–99)
Glucose-Capillary: 104 mg/dL — ABNORMAL HIGH (ref 65–99)

## 2016-11-07 LAB — PLATELET INHIBITION P2Y12: Platelet Function  P2Y12: 197 [PRU] (ref 194–418)

## 2016-11-07 LAB — CREATININE, SERUM
Creatinine, Ser: 0.81 mg/dL (ref 0.61–1.24)
GFR calc Af Amer: >60 mL/min (ref >60)
GFR calc non Af Amer: >60 mL/min (ref >60)

## 2016-11-07 LAB — HEMOGLOBIN AND HEMATOCRIT, BLOOD
HCT: 25.4 % — ABNORMAL LOW (ref 39.0–52.0)
Hemoglobin: 8.8 g/dL — ABNORMAL LOW (ref 13.0–17.0)

## 2016-11-07 LAB — MAGNESIUM: Magnesium: 2.6 mg/dL — ABNORMAL HIGH (ref 1.7–2.4)

## 2016-11-07 LAB — PLATELET COUNT: Platelets: 108 10*3/uL — ABNORMAL LOW (ref 150–400)

## 2016-11-07 LAB — APTT: APTT: 42 s — AB (ref 24–36)

## 2016-11-07 SURGERY — CORONARY ARTERY BYPASS GRAFTING (CABG)
Anesthesia: General | Site: Chest

## 2016-11-07 MED ORDER — FAMOTIDINE IN NACL 20-0.9 MG/50ML-% IV SOLN
20.0000 mg | Freq: Two times a day (BID) | INTRAVENOUS | Status: AC
  Administered 2016-11-07: 20 mg via INTRAVENOUS

## 2016-11-07 MED ORDER — ASPIRIN 81 MG PO CHEW: 324.0000 mg | CHEWABLE_TABLET | Freq: Every day | ORAL | Status: DC

## 2016-11-07 MED ORDER — MORPHINE SULFATE (PF) 4 MG/ML IV SOLN
2.0000 mg | INTRAVENOUS | Status: DC | PRN
  Administered 2016-11-07: 4 mg via INTRAVENOUS
  Administered 2016-11-07: 2 mg via INTRAVENOUS
  Administered 2016-11-08 (×3): 4 mg via INTRAVENOUS
  Filled 2016-11-07 (×5): qty 1

## 2016-11-07 MED ORDER — PROTAMINE SULFATE 10 MG/ML IV SOLN
INTRAVENOUS | Status: AC
  Filled 2016-11-07: qty 5

## 2016-11-07 MED ORDER — FENTANYL CITRATE (PF) 250 MCG/5ML IJ SOLN
INTRAMUSCULAR | Status: AC
  Filled 2016-11-07: qty 5

## 2016-11-07 MED ORDER — ACETAMINOPHEN 500 MG PO TABS
1000.0000 mg | ORAL_TABLET | Freq: Four times a day (QID) | ORAL | Status: DC
  Administered 2016-11-08 – 2016-11-09 (×6): 1000 mg via ORAL
  Filled 2016-11-07 (×6): qty 2

## 2016-11-07 MED ORDER — SODIUM CHLORIDE 0.9% FLUSH
10.0000 mL | Freq: Two times a day (BID) | INTRAVENOUS | Status: DC
  Administered 2016-11-08 – 2016-11-09 (×2): 10 mL

## 2016-11-07 MED ORDER — ACETAMINOPHEN 650 MG RE SUPP
650.0000 mg | Freq: Once | RECTAL | Status: AC
  Administered 2016-11-07: 650 mg via RECTAL

## 2016-11-07 MED ORDER — ALBUMIN HUMAN 5 % IV SOLN
INTRAVENOUS | Status: DC | PRN
  Administered 2016-11-07: 13:00:00 via INTRAVENOUS

## 2016-11-07 MED ORDER — CHLORHEXIDINE GLUCONATE CLOTH 2 % EX PADS
6.0000 | MEDICATED_PAD | Freq: Every day | CUTANEOUS | Status: DC
Start: 2016-11-07 — End: 2016-11-08
  Administered 2016-11-07: 6 via TOPICAL

## 2016-11-07 MED ORDER — CALCIUM CHLORIDE 10 % IV SOLN
INTRAVENOUS | Status: DC | PRN
  Administered 2016-11-07: 400 mg via INTRAVENOUS

## 2016-11-07 MED ORDER — SODIUM CHLORIDE 0.9 % IV SOLN
0.0000 ug/min | INTRAVENOUS | Status: DC
  Filled 2016-11-07: qty 2

## 2016-11-07 MED ORDER — PANTOPRAZOLE SODIUM 40 MG PO TBEC
40.0000 mg | DELAYED_RELEASE_TABLET | Freq: Every day | ORAL | Status: DC
  Administered 2016-11-09: 40 mg via ORAL
  Filled 2016-11-07: qty 1

## 2016-11-07 MED ORDER — MIDAZOLAM HCL 10 MG/2ML IJ SOLN
INTRAMUSCULAR | Status: AC
  Filled 2016-11-07: qty 2

## 2016-11-07 MED ORDER — CHLORHEXIDINE GLUCONATE 0.12% ORAL RINSE (MEDLINE KIT)
15.0000 mL | Freq: Two times a day (BID) | OROMUCOSAL | Status: DC
  Administered 2016-11-07: 15 mL via OROMUCOSAL

## 2016-11-07 MED ORDER — METOPROLOL TARTRATE 12.5 MG HALF TABLET
12.5000 mg | ORAL_TABLET | Freq: Two times a day (BID) | ORAL | Status: DC
  Administered 2016-11-08 – 2016-11-09 (×3): 12.5 mg via ORAL
  Filled 2016-11-07 (×3): qty 1

## 2016-11-07 MED ORDER — MORPHINE SULFATE (PF) 2 MG/ML IV SOLN: 1.0000 mg | INTRAVENOUS | Status: DC | PRN

## 2016-11-07 MED ORDER — MAGNESIUM SULFATE 4 GM/100ML IV SOLN
4.0000 g | Freq: Once | INTRAVENOUS | Status: AC
  Administered 2016-11-07: 4 g via INTRAVENOUS
  Filled 2016-11-07: qty 100

## 2016-11-07 MED ORDER — SODIUM CHLORIDE 0.9 % IV SOLN
0.0000 ug/kg/h | INTRAVENOUS | Status: DC
  Filled 2016-11-07: qty 2

## 2016-11-07 MED ORDER — ONDANSETRON HCL 4 MG/2ML IJ SOLN
4.0000 mg | Freq: Four times a day (QID) | INTRAMUSCULAR | Status: DC | PRN
Start: 2016-11-07 — End: 2016-11-09
  Administered 2016-11-08 (×2): 4 mg via INTRAVENOUS
  Filled 2016-11-07 (×2): qty 2

## 2016-11-07 MED ORDER — SODIUM CHLORIDE 0.9 % IV SOLN
INTRAVENOUS | Status: DC
  Filled 2016-11-07: qty 1

## 2016-11-07 MED ORDER — FENTANYL CITRATE (PF) 250 MCG/5ML IJ SOLN
INTRAMUSCULAR | Status: AC
  Filled 2016-11-07: qty 30

## 2016-11-07 MED ORDER — PROTAMINE SULFATE 10 MG/ML IV SOLN
INTRAVENOUS | Status: DC | PRN
  Administered 2016-11-07: 270 mg via INTRAVENOUS

## 2016-11-07 MED ORDER — OXYCODONE HCL 5 MG PO TABS
5.0000 mg | ORAL_TABLET | ORAL | Status: DC | PRN
  Administered 2016-11-07 – 2016-11-08 (×7): 10 mg via ORAL
  Filled 2016-11-07 (×7): qty 2

## 2016-11-07 MED ORDER — HEMOSTATIC AGENTS (NO CHARGE) OPTIME
TOPICAL | Status: DC | PRN
  Administered 2016-11-07 (×2): 1 via TOPICAL

## 2016-11-07 MED ORDER — LACTATED RINGERS IV SOLN
INTRAVENOUS | Status: DC | PRN
  Administered 2016-11-07: 07:00:00 via INTRAVENOUS

## 2016-11-07 MED ORDER — DIPHENHYDRAMINE HCL 50 MG/ML IJ SOLN
INTRAMUSCULAR | Status: AC
  Filled 2016-11-07: qty 1

## 2016-11-07 MED ORDER — DOCUSATE SODIUM 100 MG PO CAPS
200.0000 mg | ORAL_CAPSULE | Freq: Every day | ORAL | Status: DC
  Administered 2016-11-08 – 2016-11-09 (×2): 200 mg via ORAL
  Filled 2016-11-07 (×2): qty 2

## 2016-11-07 MED ORDER — LIDOCAINE 2% (20 MG/ML) 5 ML SYRINGE
INTRAMUSCULAR | Status: AC
  Filled 2016-11-07: qty 5

## 2016-11-07 MED ORDER — FENTANYL CITRATE (PF) 250 MCG/5ML IJ SOLN
INTRAMUSCULAR | Status: DC | PRN
  Administered 2016-11-07: 750 ug via INTRAVENOUS
  Administered 2016-11-07 (×2): 250 ug via INTRAVENOUS
  Administered 2016-11-07: 100 ug via INTRAVENOUS
  Administered 2016-11-07 (×2): 250 ug via INTRAVENOUS
  Administered 2016-11-07: 150 ug via INTRAVENOUS
  Administered 2016-11-07: 250 ug via INTRAVENOUS

## 2016-11-07 MED ORDER — NITROGLYCERIN IN D5W 200-5 MCG/ML-% IV SOLN: 0.0000 ug/min | INTRAVENOUS | Status: DC

## 2016-11-07 MED ORDER — SODIUM CHLORIDE 0.9% FLUSH: 3.0000 mL | INTRAVENOUS | Status: DC | PRN

## 2016-11-07 MED ORDER — PROTAMINE SULFATE 10 MG/ML IV SOLN
INTRAVENOUS | Status: AC
  Filled 2016-11-07: qty 25

## 2016-11-07 MED ORDER — SODIUM CHLORIDE 0.9 % IJ SOLN
INTRAMUSCULAR | Status: AC
  Filled 2016-11-07: qty 10

## 2016-11-07 MED ORDER — ROCURONIUM BROMIDE 10 MG/ML (PF) SYRINGE
PREFILLED_SYRINGE | INTRAVENOUS | Status: AC
  Filled 2016-11-07: qty 10

## 2016-11-07 MED ORDER — LACTATED RINGERS IV SOLN: INTRAVENOUS | Status: DC

## 2016-11-07 MED ORDER — SODIUM CHLORIDE 0.9% FLUSH: 10.0000 mL | INTRAVENOUS | Status: DC | PRN

## 2016-11-07 MED ORDER — ROCURONIUM BROMIDE 10 MG/ML (PF) SYRINGE
PREFILLED_SYRINGE | INTRAVENOUS | Status: AC
  Filled 2016-11-07: qty 5

## 2016-11-07 MED ORDER — MIDAZOLAM HCL 2 MG/2ML IJ SOLN
2.0000 mg | INTRAMUSCULAR | Status: DC | PRN
  Administered 2016-11-07 (×2): 2 mg via INTRAVENOUS
  Filled 2016-11-07 (×2): qty 2

## 2016-11-07 MED ORDER — DEXMEDETOMIDINE HCL IN NACL 400 MCG/100ML IV SOLN
0.1000 ug/kg/h | INTRAVENOUS | Status: DC
  Filled 2016-11-07: qty 100

## 2016-11-07 MED ORDER — BISACODYL 5 MG PO TBEC
10.0000 mg | DELAYED_RELEASE_TABLET | Freq: Every day | ORAL | Status: DC
  Administered 2016-11-08 – 2016-11-09 (×2): 10 mg via ORAL
  Filled 2016-11-07 (×2): qty 2

## 2016-11-07 MED ORDER — ORAL CARE MOUTH RINSE: 15.0000 mL | Freq: Four times a day (QID) | OROMUCOSAL | Status: DC

## 2016-11-07 MED ORDER — ARTIFICIAL TEARS OPHTHALMIC OINT
TOPICAL_OINTMENT | OPHTHALMIC | Status: AC
  Filled 2016-11-07: qty 3.5

## 2016-11-07 MED ORDER — LABETALOL HCL 5 MG/ML IV SOLN
10.0000 mg | Freq: Once | INTRAVENOUS | Status: AC
  Administered 2016-11-07: 10 mg via INTRAVENOUS

## 2016-11-07 MED ORDER — 0.9 % SODIUM CHLORIDE (POUR BTL) OPTIME
TOPICAL | Status: DC | PRN
  Administered 2016-11-07: 5000 mL

## 2016-11-07 MED ORDER — ATORVASTATIN CALCIUM 80 MG PO TABS
80.0000 mg | ORAL_TABLET | Freq: Every day | ORAL | Status: DC
  Administered 2016-11-08 – 2016-11-11 (×4): 80 mg via ORAL
  Filled 2016-11-07 (×4): qty 1

## 2016-11-07 MED ORDER — MORPHINE SULFATE (PF) 4 MG/ML IV SOLN
1.0000 mg | INTRAVENOUS | Status: AC | PRN
  Administered 2016-11-07: 2 mg via INTRAVENOUS
  Filled 2016-11-07: qty 1

## 2016-11-07 MED ORDER — BISACODYL 10 MG RE SUPP: 10.0000 mg | Freq: Every day | RECTAL | Status: DC

## 2016-11-07 MED ORDER — GLYCOPYRROLATE 0.2 MG/ML IJ SOLN
INTRAMUSCULAR | Status: DC | PRN
  Administered 2016-11-07: 0.2 mg via INTRAVENOUS

## 2016-11-07 MED ORDER — ROCURONIUM BROMIDE 10 MG/ML (PF) SYRINGE
PREFILLED_SYRINGE | INTRAVENOUS | Status: DC | PRN
  Administered 2016-11-07: 50 mg via INTRAVENOUS
  Administered 2016-11-07: 20 mg via INTRAVENOUS
  Administered 2016-11-07 (×3): 50 mg via INTRAVENOUS

## 2016-11-07 MED ORDER — MIDAZOLAM HCL 2 MG/2ML IJ SOLN
INTRAMUSCULAR | Status: AC
  Filled 2016-11-07: qty 2

## 2016-11-07 MED ORDER — METOPROLOL TARTRATE 25 MG/10 ML ORAL SUSPENSION
12.5000 mg | Freq: Two times a day (BID) | ORAL | Status: DC
  Filled 2016-11-07: qty 5

## 2016-11-07 MED ORDER — MIDAZOLAM HCL 5 MG/5ML IJ SOLN
INTRAMUSCULAR | Status: DC | PRN
  Administered 2016-11-07: 2 mg via INTRAVENOUS
  Administered 2016-11-07: 3 mg via INTRAVENOUS
  Administered 2016-11-07: 7 mg via INTRAVENOUS

## 2016-11-07 MED ORDER — SODIUM CHLORIDE 0.9 % IV SOLN
INTRAVENOUS | Status: DC
  Administered 2016-11-07: 15:00:00 via INTRAVENOUS

## 2016-11-07 MED ORDER — DEXTROSE 5 % IV SOLN
1.5000 g | Freq: Two times a day (BID) | INTRAVENOUS | Status: AC
  Administered 2016-11-07 – 2016-11-09 (×4): 1.5 g via INTRAVENOUS
  Filled 2016-11-07 (×4): qty 1.5

## 2016-11-07 MED ORDER — METOPROLOL TARTRATE 5 MG/5ML IV SOLN
2.5000 mg | INTRAVENOUS | Status: DC | PRN
  Administered 2016-11-07: 5 mg via INTRAVENOUS

## 2016-11-07 MED ORDER — THROMBIN 20000 UNITS EX SOLR
CUTANEOUS | Status: AC
  Filled 2016-11-07: qty 20000

## 2016-11-07 MED ORDER — ALBUMIN HUMAN 5 % IV SOLN
250.0000 mL | INTRAVENOUS | Status: AC | PRN
  Administered 2016-11-07 – 2016-11-08 (×4): 250 mL via INTRAVENOUS
  Filled 2016-11-07 (×2): qty 250

## 2016-11-07 MED ORDER — NITROGLYCERIN 0.2 MG/ML ON CALL CATH LAB
INTRAVENOUS | Status: DC | PRN
  Administered 2016-11-07: 80 ug via INTRAVENOUS

## 2016-11-07 MED ORDER — SUCCINYLCHOLINE CHLORIDE 200 MG/10ML IV SOSY
PREFILLED_SYRINGE | INTRAVENOUS | Status: AC
  Filled 2016-11-07: qty 10

## 2016-11-07 MED ORDER — CALCIUM CHLORIDE 10 % IV SOLN
INTRAVENOUS | Status: AC
  Filled 2016-11-07: qty 10

## 2016-11-07 MED ORDER — ASPIRIN EC 325 MG PO TBEC
325.0000 mg | DELAYED_RELEASE_TABLET | Freq: Every day | ORAL | Status: DC
  Administered 2016-11-08 – 2016-11-09 (×2): 325 mg via ORAL
  Filled 2016-11-07 (×2): qty 1

## 2016-11-07 MED ORDER — SODIUM CHLORIDE 0.9 % IJ SOLN
OROMUCOSAL | Status: DC | PRN
  Administered 2016-11-07: 4 mL via TOPICAL

## 2016-11-07 MED ORDER — PHENYLEPHRINE 40 MCG/ML (10ML) SYRINGE FOR IV PUSH (FOR BLOOD PRESSURE SUPPORT)
PREFILLED_SYRINGE | INTRAVENOUS | Status: AC
  Filled 2016-11-07: qty 10

## 2016-11-07 MED ORDER — CHLORHEXIDINE GLUCONATE 0.12 % MT SOLN
15.0000 mL | OROMUCOSAL | Status: AC
  Administered 2016-11-07: 15 mL via OROMUCOSAL

## 2016-11-07 MED ORDER — INSULIN REGULAR BOLUS VIA INFUSION
0.0000 [IU] | Freq: Three times a day (TID) | INTRAVENOUS | Status: DC
  Filled 2016-11-07: qty 10

## 2016-11-07 MED ORDER — PROPOFOL 10 MG/ML IV BOLUS
INTRAVENOUS | Status: AC
  Filled 2016-11-07: qty 20

## 2016-11-07 MED ORDER — LABETALOL HCL 5 MG/ML IV SOLN
INTRAVENOUS | Status: AC
  Filled 2016-11-07: qty 4

## 2016-11-07 MED ORDER — ACETAMINOPHEN 160 MG/5ML PO SOLN: 1000.0000 mg | Freq: Four times a day (QID) | ORAL | Status: DC

## 2016-11-07 MED ORDER — SODIUM CHLORIDE 0.45 % IV SOLN
INTRAVENOUS | Status: DC | PRN
  Administered 2016-11-07: 15:00:00 via INTRAVENOUS

## 2016-11-07 MED ORDER — HEPARIN SODIUM (PORCINE) 1000 UNIT/ML IJ SOLN
INTRAMUSCULAR | Status: DC | PRN
  Administered 2016-11-07: 27000 [IU] via INTRAVENOUS

## 2016-11-07 MED ORDER — PROPOFOL 10 MG/ML IV BOLUS
INTRAVENOUS | Status: DC | PRN
  Administered 2016-11-07: 100 mg via INTRAVENOUS

## 2016-11-07 MED ORDER — SODIUM CHLORIDE 0.9 % IJ SOLN
INTRAMUSCULAR | Status: DC | PRN
  Administered 2016-11-07: 4 mL via TOPICAL

## 2016-11-07 MED ORDER — ORAL CARE MOUTH RINSE
15.0000 mL | Freq: Two times a day (BID) | OROMUCOSAL | Status: DC
Start: 2016-11-07 — End: 2016-11-12

## 2016-11-07 MED ORDER — TRAMADOL HCL 50 MG PO TABS
50.0000 mg | ORAL_TABLET | ORAL | Status: DC | PRN
  Administered 2016-11-07 – 2016-11-09 (×8): 100 mg via ORAL
  Filled 2016-11-07 (×8): qty 2

## 2016-11-07 MED ORDER — VECURONIUM BROMIDE 10 MG IV SOLR: INTRAVENOUS | Status: DC | PRN

## 2016-11-07 MED ORDER — LACTATED RINGERS IV SOLN
INTRAVENOUS | Status: DC | PRN
  Administered 2016-11-07: 06:00:00 via INTRAVENOUS

## 2016-11-07 MED ORDER — HEPARIN SODIUM (PORCINE) 1000 UNIT/ML IJ SOLN
INTRAMUSCULAR | Status: AC
  Filled 2016-11-07: qty 1

## 2016-11-07 MED ORDER — CHLORHEXIDINE GLUCONATE 4 % EX LIQD: 30.0000 mL | CUTANEOUS | Status: DC

## 2016-11-07 MED ORDER — LACTATED RINGERS IV SOLN: 500.0000 mL | Freq: Once | INTRAVENOUS | Status: DC | PRN

## 2016-11-07 MED ORDER — SODIUM CHLORIDE 0.9 % IV SOLN: 250.0000 mL | INTRAVENOUS | Status: DC

## 2016-11-07 MED ORDER — VANCOMYCIN HCL IN DEXTROSE 1-5 GM/200ML-% IV SOLN
1000.0000 mg | Freq: Once | INTRAVENOUS | Status: AC
  Administered 2016-11-07: 1000 mg via INTRAVENOUS
  Filled 2016-11-07: qty 200

## 2016-11-07 MED ORDER — ACETAMINOPHEN 160 MG/5ML PO SOLN: 650.0000 mg | Freq: Once | ORAL | Status: AC

## 2016-11-07 MED ORDER — POTASSIUM CHLORIDE 10 MEQ/50ML IV SOLN
10.0000 meq | INTRAVENOUS | Status: AC
  Administered 2016-11-07 (×3): 10 meq via INTRAVENOUS

## 2016-11-07 MED ORDER — GABAPENTIN 300 MG PO CAPS
300.0000 mg | ORAL_CAPSULE | Freq: Every day | ORAL | Status: DC
  Administered 2016-11-08 – 2016-11-11 (×4): 300 mg via ORAL
  Filled 2016-11-07 (×4): qty 1

## 2016-11-07 MED ORDER — MORPHINE SULFATE (PF) 2 MG/ML IV SOLN: 2.0000 mg | INTRAVENOUS | Status: DC | PRN

## 2016-11-07 MED ORDER — SODIUM CHLORIDE 0.9% FLUSH
3.0000 mL | Freq: Two times a day (BID) | INTRAVENOUS | Status: DC
  Administered 2016-11-08 – 2016-11-09 (×2): 3 mL via INTRAVENOUS

## 2016-11-07 SURGICAL SUPPLY — 97 items
ADH SKN CLS APL DERMABOND .7 (GAUZE/BANDAGES/DRESSINGS) ×2
AGENT HMST MTR 8 SURGIFLO (HEMOSTASIS) ×2
BAG DECANTER FOR FLEXI CONT (MISCELLANEOUS) ×3 IMPLANT
BANDAGE ACE 4X5 VEL STRL LF (GAUZE/BANDAGES/DRESSINGS) ×3 IMPLANT
BANDAGE ACE 6X5 VEL STRL LF (GAUZE/BANDAGES/DRESSINGS) ×3 IMPLANT
BLADE STERNUM SYSTEM 6 (BLADE) ×3 IMPLANT
BLADE SURG 11 STRL SS (BLADE) ×1 IMPLANT
BNDG GAUZE ELAST 4 BULKY (GAUZE/BANDAGES/DRESSINGS) ×3 IMPLANT
CANISTER SUCT 3000ML PPV (MISCELLANEOUS) ×3 IMPLANT
CATH CPB KIT GERHARDT (MISCELLANEOUS) ×3 IMPLANT
CATH THORACIC 28FR (CATHETERS) ×3 IMPLANT
CRADLE DONUT ADULT HEAD (MISCELLANEOUS) ×3 IMPLANT
DERMABOND ADVANCED (GAUZE/BANDAGES/DRESSINGS) ×1
DERMABOND ADVANCED .7 DNX12 (GAUZE/BANDAGES/DRESSINGS) IMPLANT
DRAIN CHANNEL 28F RND 3/8 FF (WOUND CARE) ×3 IMPLANT
DRAPE CARDIOVASCULAR INCISE (DRAPES) ×3
DRAPE SLUSH/WARMER DISC (DRAPES) ×3 IMPLANT
DRAPE SRG 135X102X78XABS (DRAPES) ×2 IMPLANT
DRSG AQUACEL AG ADV 3.5X14 (GAUZE/BANDAGES/DRESSINGS) ×3 IMPLANT
ELECT BLADE 4.0 EZ CLEAN MEGAD (MISCELLANEOUS) ×3
ELECT REM PT RETURN 9FT ADLT (ELECTROSURGICAL) ×6
ELECTRODE BLDE 4.0 EZ CLN MEGD (MISCELLANEOUS) ×2 IMPLANT
ELECTRODE REM PT RTRN 9FT ADLT (ELECTROSURGICAL) ×4 IMPLANT
FELT TEFLON 1X6 (MISCELLANEOUS) ×5 IMPLANT
GAUZE SPONGE 4X4 12PLY STRL (GAUZE/BANDAGES/DRESSINGS) ×6 IMPLANT
GAUZE SPONGE 4X4 12PLY STRL LF (GAUZE/BANDAGES/DRESSINGS) ×2 IMPLANT
GLOVE BIO SURGEON STRL SZ 6.5 (GLOVE) ×9 IMPLANT
GLOVE BIOGEL M 6.5 STRL (GLOVE) ×3 IMPLANT
GLOVE BIOGEL M STER SZ 6 (GLOVE) ×2 IMPLANT
GLOVE BIOGEL PI IND STRL 6 (GLOVE) IMPLANT
GLOVE BIOGEL PI IND STRL 6.5 (GLOVE) IMPLANT
GLOVE BIOGEL PI INDICATOR 6 (GLOVE) ×1
GLOVE BIOGEL PI INDICATOR 6.5 (GLOVE) ×5
GOWN STRL REUS W/ TWL LRG LVL3 (GOWN DISPOSABLE) ×8 IMPLANT
GOWN STRL REUS W/TWL LRG LVL3 (GOWN DISPOSABLE) ×30
HEMOSTAT POWDER SURGIFOAM 1G (HEMOSTASIS) ×9 IMPLANT
HEMOSTAT SURGICEL 2X14 (HEMOSTASIS) ×3 IMPLANT
KIT BASIN OR (CUSTOM PROCEDURE TRAY) ×3 IMPLANT
KIT CATH SUCT 8FR (CATHETERS) ×3 IMPLANT
KIT ROOM TURNOVER OR (KITS) ×3 IMPLANT
KIT SUCTION CATH 14FR (SUCTIONS) ×6 IMPLANT
KIT VASOVIEW HEMOPRO VH 3000 (KITS) ×3 IMPLANT
LEAD PACING MYOCARDI (MISCELLANEOUS) ×3 IMPLANT
MARKER GRAFT CORONARY BYPASS (MISCELLANEOUS) ×9 IMPLANT
NS IRRIG 1000ML POUR BTL (IV SOLUTION) ×15 IMPLANT
PACK OPEN HEART (CUSTOM PROCEDURE TRAY) ×3 IMPLANT
PAD ARMBOARD 7.5X6 YLW CONV (MISCELLANEOUS) ×6 IMPLANT
PAD ELECT DEFIB RADIOL ZOLL (MISCELLANEOUS) ×3 IMPLANT
PENCIL BUTTON HOLSTER BLD 10FT (ELECTRODE) ×3 IMPLANT
PUNCH AORTIC ROT 4.0MM RCL 40 (MISCELLANEOUS) ×1 IMPLANT
PUNCH AORTIC ROTATE  4.5MM 8IN (MISCELLANEOUS) ×1 IMPLANT
SET CARDIOPLEGIA MPS 5001102 (MISCELLANEOUS) ×1 IMPLANT
SPOGE SURGIFLO 8M (HEMOSTASIS) ×1
SPONGE LAP 18X18 X RAY DECT (DISPOSABLE) ×4 IMPLANT
SPONGE SURGIFLO 8M (HEMOSTASIS) IMPLANT
SUT BONE WAX W31G (SUTURE) ×3 IMPLANT
SUT ETHIBOND 2 0 SH (SUTURE) ×15
SUT ETHIBOND 2 0 SH 36X2 (SUTURE) IMPLANT
SUT MNCRL AB 4-0 PS2 18 (SUTURE) ×1 IMPLANT
SUT PROLENE 3 0 SH1 36 (SUTURE) ×3 IMPLANT
SUT PROLENE 4 0 RB 1 (SUTURE) ×3
SUT PROLENE 4 0 TF (SUTURE) ×6 IMPLANT
SUT PROLENE 4-0 RB1 .5 CRCL 36 (SUTURE) IMPLANT
SUT PROLENE 5 0 C 1 36 (SUTURE) ×2 IMPLANT
SUT PROLENE 6 0 C 1 30 (SUTURE) ×3 IMPLANT
SUT PROLENE 6 0 CC (SUTURE) ×8 IMPLANT
SUT PROLENE 7 0 BV1 MDA (SUTURE) ×5 IMPLANT
SUT PROLENE 8 0 BV175 6 (SUTURE) ×7 IMPLANT
SUT SILK  1 MH (SUTURE) ×3
SUT SILK 1 MH (SUTURE) IMPLANT
SUT SILK 1 TIES 10X30 (SUTURE) ×1 IMPLANT
SUT SILK 2 0 SH CR/8 (SUTURE) ×2 IMPLANT
SUT SILK 2 0 TIES 10X30 (SUTURE) ×1 IMPLANT
SUT SILK 2 0 TIES 17X18 (SUTURE) ×3
SUT SILK 2-0 18XBRD TIE BLK (SUTURE) IMPLANT
SUT SILK 3 0 SH CR/8 (SUTURE) ×1 IMPLANT
SUT SILK 4 0 TIE 10X30 (SUTURE) ×2 IMPLANT
SUT STEEL 6MS V (SUTURE) ×3 IMPLANT
SUT STEEL SZ 6 DBL 3X14 BALL (SUTURE) ×3 IMPLANT
SUT TEM PAC WIRE 2 0 SH (SUTURE) ×4 IMPLANT
SUT VIC AB 1 CTX 18 (SUTURE) ×6 IMPLANT
SUT VIC AB 2-0 CT1 27 (SUTURE) ×3
SUT VIC AB 2-0 CT1 TAPERPNT 27 (SUTURE) IMPLANT
SUT VIC AB 2-0 CTX 27 (SUTURE) ×2 IMPLANT
SUT VIC AB 3-0 X1 27 (SUTURE) ×2 IMPLANT
SUTURE E-PAK OPEN HEART (SUTURE) ×2 IMPLANT
SYSTEM SAHARA CHEST DRAIN ATS (WOUND CARE) ×3 IMPLANT
TAPE CLOTH SURG 4X10 WHT LF (GAUZE/BANDAGES/DRESSINGS) ×1 IMPLANT
TAPE PAPER 2X10 WHT MICROPORE (GAUZE/BANDAGES/DRESSINGS) ×1 IMPLANT
TOWEL GREEN STERILE (TOWEL DISPOSABLE) ×12 IMPLANT
TOWEL GREEN STERILE FF (TOWEL DISPOSABLE) ×5 IMPLANT
TOWEL OR 17X24 6PK STRL BLUE (TOWEL DISPOSABLE) ×4 IMPLANT
TOWEL OR 17X26 10 PK STRL BLUE (TOWEL DISPOSABLE) ×4 IMPLANT
TRAY FOLEY SILVER 16FR TEMP (SET/KITS/TRAYS/PACK) ×3 IMPLANT
TUBING INSUFFLATION (TUBING) ×3 IMPLANT
UNDERPAD 30X30 (UNDERPADS AND DIAPERS) ×3 IMPLANT
WATER STERILE IRR 1000ML POUR (IV SOLUTION) ×6 IMPLANT

## 2016-11-07 NOTE — Transfer of Care (Signed)
Immediate Anesthesia Transfer of Care Note  Patient: Hunter Mcbride.  Procedure(s) Performed: Procedure(s): CORONARY ARTERY BYPASS GRAFTING (CABG) x 5 (LIMA to DISTAL LAD, SVG to DIAGONAL, SVG to CIRCUMFLEX, and SVG SEQUENTIALLY to PLB and DISTAL PDA) with EVH of the RIGHT GREATER SAPHENOUS VEIN and LEFT INTERNAL MAMMARY ARTERY HARVEST (N/A) TRANSESOPHAGEAL ECHOCARDIOGRAM (TEE) (N/A)  Patient Location: PACU  Anesthesia Type:General  Level of Consciousness: Patient remains intubated per anesthesia plan  Airway & Oxygen Therapy: Patient remains intubated per anesthesia plan and Patient placed on Ventilator (see vital sign flow sheet for setting)  Post-op Assessment: Report given to RN and Post -op Vital signs reviewed and stable  Post vital signs: Reviewed and stable  Last Vitals:  Vitals:   11/07/16 0549  BP: (!) 142/80  Pulse: (!) 52  Resp: 18  Temp: 36.6 C  SpO2: 99%    Last Pain:  Vitals:   11/07/16 0549  TempSrc: Oral      Patients Stated Pain Goal: 6 (99/83/38 2505)  Complications: No apparent anesthesia complications

## 2016-11-07 NOTE — Progress Notes (Signed)
Ph of 7.28 on ABG / all other parameters wnl / Dr. Servando Snare advised to extubate.

## 2016-11-07 NOTE — H&P (Signed)
Patient ID: Hunter Mcbride., male   DOB: 1945/09/03, 71 y.o.   MRN: [id number]      [address]       [place] [address]             [place] Record #[id number] Date of Birth: [birth year]  Referring:Dr Ellyn Hack  Primary Cardiology: Dr Burt Knack  Primary Care: Crist Infante, MD  Chief Complaint:    Angina  History of Present Illness:     Patient admitted with unstable angina pain two days ago recently admitted and under went repeat cath. . He has Beining history of recurrent chest pian and previous stent and angioplasties.  In the  spring of 2015 with  history of exertional chest pain he - had abnormal Myoview -At  cathed  DES x 3 to the LAD was done . Otherwise had moderate but nonobstructive disease With a normal EF of 55 to 60%.  In November of 2015 - while  [place] of [place].  He developed SSCP that initially resolved but then returned, subsequently associated with SOB and nausea - ended up going to the hospital in [place] , repeat cath done x2 . Had normal left heart pressures, patent stents extending from the proximal to the distal LAD and severe diffuse disease involving the branches of all 3 major epicardial vessels. Was recathed and had FFR of the first OM, posterior descending and distal RCA which were nonsignificant - no PCI was performed. CXR was normal. Echo was also updated - normal EF - mildly dilated aorta noted with mild LVH.   January of 2017 with angina - low risk Myoview noted. Continued on medical management. He has continued to have someepisodes of chest pain.  June of 2017 - he was not doing well - having more angina - ended up getting him recathed. Medical management continued.    Had recurrent CP and underwent repeat cath 06/07/2016 showing triple vessel CAD with severe stenosis of the PL and severe stenosis of a small to moderate PDA and underwent PTA of the PDA and PCI with DES to the PL.  The mid LAD  stent was patent.    He has continued to have upper back burning that he calls his angina along with anterior CP.   Dr. Burt Knack noted  continuing to have chronic atypical CP that he states had been very difficult to manage and has been seen multiple times for CP.   Last week   he came  to the ER with continued complaints of chest pain and burning in his back.    He took 3 SL NTG with some relief for a few minutes but then reocurred.  His sx were associated withfatigue.  Day of admission  he was working out in the yard he had recurrent cp  took a total of 5 SL NTG. Came to ER and was admitted .  Since the patient was seen in the hospital several days ago he's had no further angina but has significantly cut back his physical activity.  Lab Results  Component Value Date   TROPONINI <0.03 10/28/2016     Current Activity/ Functional Status: Patient is independent with mobility/ambulation, transfers, ADL's, IADL's.   Zubrod Score: At the time of surgery this patient's most appropriate activity status/level should be described as: []     0    Normal activity, no symptoms [x]   1    Restricted in physical strenuous activity but ambulatory, able to do out light work []     2    Ambulatory and capable of self care, unable to do work activities, up and about                 more than 50%  Of the time                            []     3    Only limited self care, in bed greater than 50% of waking hours []     4    Completely disabled, no self care, confined to bed or chair []     5    Moribund  Past Medical History:  Diagnosis Date  . Arthritis    "lower back; right shoulder; left thumb; joints" (06/21/2013)  . Asthma    "not sure if this is true or not" (06/21/2013)  . CAD (coronary artery disease) May 2015   s/p PTCA/DES x 3 to mid LAD, mod non-ob disease in Cx and RCA May 2015; s/p repeat caths - last study in November of 2015 - stents patent, - FFR - managed medically.   [name] GERD (gastroesophageal  reflux disease)   . Gout    "maybe twice in my life"  . Headache(784.0)    "weekly for the last 3-4 months" (06/21/2013)  . Hyperlipidemia   . Hypertension   . Myocardial infarction [facility])    "/Dr. Katharina Caper I've had one between 2014-2015) (06/21/2013)  . Osteoarthritis   . Sleep apnea    WEIGHT LOSS, NO LONGER NEEDS PER PATIENT  . Type II diabetes mellitus ([place])    TYPE 2    Past Surgical History:  Procedure Laterality Date  . CARDIAC CATHETERIZATION  1980's   "once"  . CARDIAC CATHETERIZATION N/A 07/23/2015   Procedure: Left Heart Cath and Coronary Angiography;  Surgeon: Sherren Mocha, MD;  Location: [place] [facility];  Service: Cardiovascular;  Laterality: N/A;  . CARDIOVASCULAR STRESS TEST  06/10/2008   EF 68%  . CARPAL TUNNEL RELEASE Bilateral   . CORONARY ANGIOPLASTY WITH STENT PLACEMENT  06/21/2013   "3"  . CORONARY STENT INTERVENTION N/A 06/07/2016   Procedure: Coronary Stent Intervention;  Surgeon: Burnell Blanks, MD;  Location: [place] [facility];  Service: Cardiovascular;  Laterality: N/A;  . EXCISION MORTON'S NEUROMA Left   . EYE SURGERY Left    "removed film over my eye"  . FRACTIONAL FLOW RESERVE WIRE N/A 06/26/2013   Procedure: FRACTIONAL FLOW RESERVE WIRE;  Surgeon: [name], MD;  Location: [place] C[facility];  Service: Cardiovascular;  Laterality: N/A;  . HERNIA REPAIR Left   . INTRAVASCULAR PRESSURE WIRE/FFR STUDY N/A 10/28/2016   Procedure: INTRAVASCULAR PRESSURE WIRE/FFR STUDY;  Surgeon: Leonie Man, MD;  Location: [place] [facility];  Service: Cardiovascular;  Laterality: N/A;  . JOINT REPLACEMENT Left 03/2013   "thumb"  . LEFT HEART CATH AND CORONARY ANGIOGRAPHY N/A 06/07/2016   Procedure: Left Heart Cath and Coronary Angiography;  Surgeon: Burnell Blanks, MD;  Location: [place] [facility];  Service: Cardiovascular;  Laterality: N/A;  . LEFT HEART CATH AND CORONARY ANGIOGRAPHY N/A 10/28/2016   Procedure: LEFT HEART CATH AND CORONARY ANGIOGRAPHY;   Surgeon: Leonie Man, MD;  Location: [place] [facility];  Service: Cardiovascular;  Laterality: N/A;  . LEFT HEART CATHETERIZATION WITH CORONARY ANGIOGRAM N/A 06/21/2013  Procedure: LEFT HEART CATHETERIZATION WITH CORONARY ANGIOGRAM;  Surgeon: Burnell Blanks, MD;  Location: [facility];  Service: Cardiovascular;  Laterality: N/A;  . LEFT HEART CATHETERIZATION WITH CORONARY ANGIOGRAM N/A 06/26/2013   Procedure: LEFT HEART CATHETERIZATION WITH CORONARY ANGIOGRAM;  Surgeon: [name], MD;  Location: [place] C[facility];  Service: Cardiovascular;  Laterality: N/A;  . TONSILLECTOMY    . TOTAL HIP ARTHROPLASTY Right 04/12/2016   Procedure: RIGHT TOTAL HIP ARTHROPLASTY ANTERIOR APPROACH;  Surgeon: Paralee Cancel, MD;  Location: [facility];  Service: Orthopedics;  Laterality: Right;  requests 70 mins  . TOTAL SHOULDER ARTHROPLASTY  03/01/2012   Procedure: TOTAL SHOULDER ARTHROPLASTY;  Surgeon: Marin Shutter, MD;  Location: [place];  Service: Orthopedics;  Laterality: Right;  . UMBILICAL HERNIA REPAIR      History  Smoking Status  . Never Smoker  Smokeless Tobacco  . Never Used    History  Alcohol Use No    Social History   Social History  . Marital status: Married    Spouse name: N/A  . Number of children: N/A  . Years of education: N/A   Occupational History  . Not on file.   Social History Main Topics  . Smoking status: Never Smoker  . Smokeless tobacco: Never Used  . Alcohol use No  . Drug use: No  . Sexual activity: Yes   Other Topics Concern  . Not on file   Social History Narrative  . No narrative on file    No Known Allergies  Current Facility-Administered Medications  Medication Dose Route Frequency Provider Last Rate Last Dose  . cefUROXime (ZINACEF) 1.5 g in dextrose 5 % 50 mL IVPB  1.5 g Intravenous To OR Grace Isaac, MD      . cefUROXime (ZINACEF) 750 mg in dextrose 5 % 50 mL IVPB  750 mg Intravenous To OR Grace Isaac, MD      . chlorhexidine  (HIBICLENS) 4 % liquid 2 application  30 mL Topical UD Grace Isaac, MD      . dexmedetomidine (PRECEDEX) 400 MCG/100ML (4 mcg/mL) infusion  0.1-0.7 mcg/kg/hr Intravenous To OR Grace Isaac, MD      . DOPamine (INTROPIN) 800 mg in dextrose 5 % 250 mL (3.2 mg/mL) infusion  0-10 mcg/kg/min Intravenous To OR Grace Isaac, MD      . EPINEPHrine (ADRENALIN) 4 mg in dextrose 5 % 250 mL (0.016 mg/mL) infusion  0-10 mcg/min Intravenous To OR Grace Isaac, MD      . heparin 2,500 Units, papaverine 30 mg in electrolyte-148 (PLASMALYTE-148) 500 mL irrigation   Irrigation To OR Grace Isaac, MD      . heparin 30,000 units/NS 1000 mL solution for CELLSAVER   Other To OR Grace Isaac, MD      . insulin regular (NOVOLIN R,HUMULIN R) 100 Units in sodium chloride 0.9 % 100 mL (1 Units/mL) infusion   Intravenous To OR Grace Isaac, MD      . magnesium sulfate (IV Push/IM) injection 40 mEq  40 mEq Other To OR Grace Isaac, MD      . metoprolol tartrate (LOPRESSOR) tablet 12.5 mg  12.5 mg Oral Once Grace Isaac, MD      . nitroGLYCERIN 50 mg in dextrose 5 % 250 mL (0.2 mg/mL) infusion  2-200 mcg/min Intravenous To OR Grace Isaac, MD      . phenylephrine (NEO-SYNEPHRINE) 20 mg in sodium chloride 0.9 %  250 mL (0.08 mg/mL) infusion  30-200 mcg/min Intravenous To OR Grace Isaac, MD      . potassium chloride injection 80 mEq  80 mEq Other To OR Grace Isaac, MD      . tranexamic acid (CYKLOKAPRON) 2,500 mg in sodium chloride 0.9 % 250 mL (10 mg/mL) infusion  1.5 mg/kg/hr Intravenous To OR Grace Isaac, MD      . tranexamic acid (CYKLOKAPRON) bolus via infusion - over 30 minutes 1,275 mg  15 mg/kg Intravenous To OR Grace Isaac, MD      . tranexamic acid (CYKLOKAPRON) pump prime solution 170 mg  2 mg/kg Intracatheter To OR Grace Isaac, MD      . vancomycin (VANCOCIN) 1,500 mg in sodium chloride 0.9 % 250 mL IVPB  1,500 mg Intravenous  To OR Grace Isaac, MD        Prescriptions Prior to Admission  Medication Sig Dispense Refill Last Dose  . acetaminophen (TYLENOL) 500 MG tablet Take 1,000 mg by mouth every 8 (eight) hours as needed (pain).    Past Week at Unknown time  . ALPRAZolam (XANAX) 0.5 MG tablet Take 0.5 mg by mouth daily as needed for anxiety.    11/07/2016 at 0415  . amLODipine (NORVASC) 5 MG tablet Take 0.5 tablets (2.5 mg total) by mouth daily.   11/07/2016 at 0415  . aspirin 81 MG tablet Take 81 mg by mouth at bedtime.    11/06/2016 at Unknown time  . atenolol (TENORMIN) 50 MG tablet Take 1 tablet (50 mg total) by mouth at bedtime. 90 tablet 3 11/06/2016 at 2100  . atorvastatin (LIPITOR) 80 MG tablet Take 80 mg by mouth daily at 6 PM.   11/06/2016 at Unknown time  . canagliflozin (INVOKANA) 300 MG TABS tablet Take 300 mg by mouth daily before breakfast.   Past Week at Unknown time  . Cholecalciferol (VITAMIN D PO) Take 1 capsule by mouth daily.   11/06/2016 at Unknown time  . clopidogrel (PLAVIX) 75 MG tablet Take 1 tablet (75 mg total) by mouth daily with breakfast. 90 tablet 3 Past Month at Unknown time  . docusate sodium (COLACE) 100 MG capsule Take 100 mg by mouth daily as needed for mild constipation.   11/06/2016 at Unknown time  . gabapentin (NEURONTIN) 300 MG capsule Take 300 mg by mouth at bedtime.    11/06/2016 at Unknown time  . isosorbide mononitrate (IMDUR) 30 MG 24 hr tablet Take 2 tablets (60 mg total) by mouth daily.   11/07/2016 at 0415  . metFORMIN (GLUCOPHAGE-XR) 500 MG 24 hr tablet Take 2 tablets (1,000 mg total) by mouth 2 (two) times daily.   11/05/2016  . nitroGLYCERIN (NITROSTAT) 0.4 MG SL tablet Place 1 tablet (0.4 mg total) under the tongue every 5 (five) minutes as needed. For chest pain 25 tablet 5 Past Month at Unknown time  . pantoprazole (PROTONIX) 40 MG tablet Take 40 mg by mouth 2 (two) times daily.   11/07/2016 at 0415  . polyethylene glycol (MIRALAX / GLYCOLAX) packet Take 17 g by  mouth daily as needed for mild constipation.   Past Week at Unknown time  . ranolazine (RANEXA) 500 MG 12 hr tablet Take 1 tablet (500 mg total) by mouth 2 (two) times daily. 180 tablet 3 11/06/2016 at Unknown time  . traMADol (ULTRAM) 50 MG tablet Take 1-2 tablets (50-100 mg total) by mouth every 6 (six) hours as needed (pain). 30 tablet 0 Past Month  at Unknown time    Family History  Problem Relation Age of Onset  . Diabetes type II Mother   . Heart attack Father   . Coronary artery disease Cousin   . Heart disease Brother      Review of Systems:  Pertinent items are noted in HPI.     Cardiac Review of Systems: Y or N  Chest Pain [ y   ]  Resting SOB [   n] Exertional SOB  [ y ]  Orthopnea [ n ]   Pedal Edema [ n  ]    Palpitations [n ] Syncope  [ n ]   Presyncope [ n  ]  General Review of Systems: [Y] = yes [  ]=no Constitional: recent weight change [ n ]; anorexia [  ]; fatigue [ y ]; nausea [  ]; night sweats [  ]; fever [  ]; or chills [  ]                                                               Dental: poor dentition[  ]; Last Dentist visit:   Eye : blurred vision [  ]; diplopia [   ]; vision changes [  ];  Amaurosis fugax[  ]; Resp: cough [ n ];  wheezing[n  ];  hemoptysis[n  ]; shortness of breath[ y ]; paroxysmal nocturnal dyspnea[ y ]; dyspnea on exertion[  ]; or orthopnea[  ];  GI:  gallstones[  ], vomiting[  ];  dysphagia[  ]; melena[  ];  hematochezia [  ]; heartburn[  ];   Hx of  Colonoscopy[  ]; GU: kidney stones [  ]; hematuria[  ];   dysuria [  ];  nocturia[  ];  history of     obstruction [  ]; urinary frequency [  ]             Skin: rash, swelling[  ];, hair loss[  ];  peripheral edema[  ];  or itching[  ]; Musculosketetal: myalgias[  ];  joint swelling[  ];  joint erythema[  ];  joint pain[  ];  back pain[ y ];  Heme/Lymph: bruising[  ];  bleeding[  ];  anemia[  ];  Neuro: TIA[  ];  headaches[  ];  stroke[  ];  vertigo[  ];  seizures[  ];   paresthesias[  ];   difficulty walking[ n ];  Psych:depression[  ]; anxiety[  ];  Endocrine: diabetes[  ];  thyroid dysfunction[  ];  Immunizations: Flu [ n ]; Pneumococcal[n  ];  Other:  Physical Exam: BP (!) 142/80   Pulse (!) 52   Temp 97.8 F (36.6 C) (Oral)   Resp 18   Wt 187 lb (84.8 kg)   SpO2 99%   BMI 27.62 kg/m    General appearance: alert, cooperative, appears stated age and no distress Head: Normocephalic, without obvious abnormality, atraumatic Neck: no adenopathy, no carotid bruit, no JVD, supple, symmetrical, trachea midline and thyroid not enlarged, symmetric, no tenderness/mass/nodules Lymph nodes: Cervical, supraclavicular, and axillary nodes normal. Resp: clear to auscultation bilaterally Back: symmetric, no curvature. ROM normal. No CVA tenderness. Cardio: regular rate and rhythm, S1, S2 normal, no murmur, click, rub or gallop GI: soft, non-tender; bowel sounds  normal; no masses,  no organomegaly Extremities: extremities normal, atraumatic, no cyanosis or edema and Homans sign is negative, no sign of DVT Neurologic: Grossly normal  Diagnostic Studies & Laboratory data:     Recent Radiology Findings:  Dg Chest 2 View  Result Date: 11/04/2016 CLINICAL DATA:  Preoperative evaluation for upcoming cardiac bypass surgery EXAM: CHEST  2 VIEW COMPARISON:  10/27/2016 FINDINGS: The heart size and mediastinal contours are within normal limits. Both lungs are clear. The visualized skeletal structures show degenerative change of the thoracic spine. Right shoulder replacement is noted. IMPRESSION: No active cardiopulmonary disease. Electronically Signed   By: Inez Catalina M.D.   On: 11/04/2016 08:56   Dg Chest 2 View  Result Date: 10/27/2016 CLINICAL DATA:  chest pain. Pt has had cp since yesterday. He said it happens off and on a lot but, has been bad since yesterday. He feels pain in his left side chest from left nipple to center of chest. He said it will also hurt straight through into his  back as well. Pt mentioned usually he'll feel the pain start in his back first, then into his chest. Pt explained he has experienced a lot of trouble with his heart in the past. Hx of CAD, HTN, MI, DM2, pt has had heart numerous heart catheterizations and stents EXAM: CHEST  2 VIEW COMPARISON:  07/08/2016 FINDINGS: Cardiac silhouette is normal in size and configuration. No mediastinal or hilar masses. No evidence of adenopathy. Clear lungs. No pleural effusion or pneumothorax. Right shoulder prosthesis, partly imaged appears well-seated and aligned. IMPRESSION: 1. No active cardiopulmonary disease. Electronically Signed   By: Lajean Manes M.D.   On: 10/27/2016 19:50    I have independently reviewed the above radiologic studies.  Recent Lab Findings: Lab Results  Component Value Date   WBC 3.6 (L) 11/03/2016   HGB 13.2 11/03/2016   HCT 39.4 11/03/2016   PLT 127 (L) 11/03/2016   GLUCOSE 126 (H) 11/03/2016   CHOL 84 10/28/2016   TRIG 31 10/28/2016   HDL 36 (L) 10/28/2016   LDLCALC 42 10/28/2016   ALT 44 11/03/2016   AST 27 11/03/2016   NA 140 11/03/2016   K 4.0 11/03/2016   CL 110 11/03/2016   CREATININE 1.07 11/03/2016   BUN 13 11/03/2016   CO2 19 (L) 11/03/2016   TSH 2.009 06/06/2016   INR 1.01 11/03/2016   HGBA1C 7.5 (H) 11/03/2016   CATH: Procedures   INTRAVASCULAR PRESSURE WIRE/FFR STUDY  LEFT HEART CATH AND CORONARY ANGIOGRAPHY  Conclusion     Ost LM lesion, 40 %stenosed.  Mid Cx lesion, 60 %stenosed. Likely not flow-limiting  Dist LAD lesion, 40 %stenosed. Distal to stent segment  1st Diag lesion, 80 %stenosed. Relatively small caliber vessel  Ost RPDA to RPDA lesion, 40 %stenosed.  RPDA lesion, 85 %stenosed.  2nd Diag lesion, 70 %stenosed. Relatively small caliber  Proximal and mid RCA 30-40% lesions with mild calcification.  1st Mrg lesion, 75 %stenosed. FFR 0.8 - significant  Dist RCA Synergy DES from April 2018, 0 %stenosed.  Prox LAD to Dist LAD  stented segment (3 overlapping DES), ~ diffuse 50 %stenosed. FFR post stent 0.73, mid stent 0.76, pre stent 0.82  The left ventricular ejection fraction is 55-65% by visual estimate.   Angiographically stable coronary arteries but with FFR significance in the LAD and OM1 suggesting progression of intraluminal disease  Due to the extent of disease segment in the LAD, likely not a good redo PCI target.  I discussed with Dr. Burt Knack, he feels strongly that the best option is for the patient be referred for coronary artery bypass surgery, and I agree.  He'll be transferred back to his unit for ongoing care with TR band removal.  Recommendation upper discussion with Dr. Burt Knack: The patient does not have critical coronary artery disease stenosis, and really has no significant disease change from before. Some of his symptoms may be atypical in nature and eventually nonanginal however he still does have classic anginal symptoms.  He has no acute requirement for inpatient monitoring pending bypass evaluation. We feel it safe for him to be discharged with pain management plan (with likely recommend narcotic pain management for short-term) and follow-up with Dr. Servando Snare early next week.  Would otherwise continue his other antianginal regimen.  I have discussed scheduling with CT surgery. It is unlikely that they will go see him as an inpatient and therefore would be preferable to set up outpatient evaluation. I would discontinue Plavix with the potential of possible CABG early next week. If this would not be possible, then would continue Plavix until's 5 days pre-op.   Glenetta Hew, M.D., M.S.   I have independently reviewed the above  cath films and reviewed the findings with the  patient .   Previous cath: Procedures   Coronary Stent Intervention  Left Heart Cath and Coronary Angiography  Conclusion     RPDA lesion, 75 %stenosed.  Ost LM lesion, 40 %stenosed.  1st Mrg lesion, 60  %stenosed.  Prox LAD to Dist LAD lesion, 10 %stenosed.  Dist LAD lesion, 40 %stenosed.  2nd Diag lesion, 80 %stenosed.  1st Diag lesion, 80 %stenosed.  Dist RCA lesion, 90 %stenosed.  Ost RPDA to RPDA lesion, 95 %stenosed.  Mid RCA lesion, 40 %stenosed.  Prox RCA to Mid RCA lesion, 40 %stenosed.  Mid Cx lesion, 60 %stenosed.  The left ventricular systolic function is normal.  LV end diastolic pressure is normal.  The left ventricular ejection fraction is 55-65% by visual estimate.  There is no mitral valve regurgitation.   1. Triple vessel CAD 2. Severe stenosis ostium of the moderate caliber posterolateral branch. Successful PTCA/DES x 1 proximal posterolateral artery.  3. Severe stenosis small to moderate caliber PDA. Successful PTCA with balloon angioplasty only proximal PDA.  4. Patent stent mid LAD.  5. Diffuse disease in the small caliber diagonal branches.  6. Normal LV systolic function 7. Mild left main stenosis.   Recommendations: Continue DAPT for at least one year. Continue statin, beta blocker, Norvasc and Ranexa.     I have independently reviewed the above  cath films and reviewed the findings with the  patient .  Transthoracic Echocardiography  Patient:    Hunter Mcbride, Hunter Mcbride MR #:       [id number] Study Date: 11/03/2016 Gender:     M Age:        89 Height:     175.3 cm Weight:     83.9 kg BSA:        2.04 m^2 Pt. Status: Room:   ATTENDING    Lanelle Bal MD  [place] MD  [place] MD  PERFORMING   [facility], Outpatient  SONOGRAPHER  Mikki Santee  cc:  ------------------------------------------------------------------- LV EF: 60% -   65%  ------------------------------------------------------------------- Indications:      CAD of native vessels 414.01.  ------------------------------------------------------------------- History:   PMH:   Myocardial infarction.  Risk factors: Hypertension.  Diabetes mellitus. Dyslipidemia.  ------------------------------------------------------------------- Study Conclusions  - Left ventricle: The cavity size was normal. Systolic function was   normal. The estimated ejection fraction was in the range of 60%   to 65%. Wall motion was normal; there were no regional wall   motion abnormalities. Doppler parameters are consistent with   abnormal left ventricular relaxation (grade 1 diastolic   dysfunction). There was no evidence of elevated ventricular   filling pressure by Doppler parameters. - Aortic valve: There was no regurgitation. - Aortic root: The aortic root was normal in size. - Mitral valve: There was trivial regurgitation. - Left atrium: The atrium was normal in size. - Right ventricle: Systolic function was normal. - Tricuspid valve: There was mild regurgitation. - Pulmonic valve: There was trivial regurgitation. - Pulmonary arteries: Systolic pressure was within the normal   range. - Inferior vena cava: The vessel was normal in size. - Pericardium, extracardiac: There was no pericardial effusion.  ------------------------------------------------------------------- Study data:  No prior study was available for comparison.  Study status:  Routine.  Procedure:  The patient reported no pain pre or post test. Transthoracic echocardiography. Image quality was adequate.  Study completion:  There were no complications. Transthoracic echocardiography.  M-mode, complete 2D, spectral Doppler, and color Doppler.  Birthdate:  Patient birthdate: 1945-04-01.  Age:  Patient is 71 yr old.  Sex:  Gender: male. BMI: 27.3 kg/m^2.  Blood pressure:     123/68  Patient status: Inpatient.  Study date:  Study date: 11/03/2016. Study time: 12:12 PM.  Location:  Echo laboratory.  -------------------------------------------------------------------  ------------------------------------------------------------------- Left ventricle:  The cavity  size was normal. Systolic function was normal. The estimated ejection fraction was in the range of 60% to 65%. Wall motion was normal; there were no regional wall motion abnormalities. Doppler parameters are consistent with abnormal left ventricular relaxation (grade 1 diastolic dysfunction). There was no evidence of elevated ventricular filling pressure by Doppler parameters.  ------------------------------------------------------------------- Aortic valve:   Trileaflet; normal thickness leaflets. Mobility was not restricted.  Doppler:  Transvalvular velocity was within the normal range. There was no stenosis. There was no regurgitation.   ------------------------------------------------------------------- Aorta:  Aortic root: The aortic root was normal in size.  ------------------------------------------------------------------- Mitral valve:   Structurally normal valve.   Mobility was not restricted.  Doppler:  Transvalvular velocity was within the normal range. There was no evidence for stenosis. There was trivial regurgitation.  ------------------------------------------------------------------- Left atrium:  The atrium was normal in size.  ------------------------------------------------------------------- Right ventricle:  The cavity size was normal. Wall thickness was normal. Systolic function was normal.  ------------------------------------------------------------------- Pulmonic valve:    Structurally normal valve.   Cusp separation was normal.  Doppler:  Transvalvular velocity was within the normal range. There was no evidence for stenosis. There was trivial regurgitation.  ------------------------------------------------------------------- Tricuspid valve:   Structurally normal valve.    Doppler: Transvalvular velocity was within the normal range. There was mild regurgitation.  ------------------------------------------------------------------- Pulmonary  artery:   The main pulmonary artery was normal-sized. Systolic pressure was within the normal range.  ------------------------------------------------------------------- Right atrium:  The atrium was normal in size.  ------------------------------------------------------------------- Pericardium:  There was no pericardial effusion.  ------------------------------------------------------------------- Systemic veins: Inferior vena cava: The vessel was normal in size.  ------------------------------------------------------------------- Measurements   Left ventricle                          Value        Reference  LV ID,  ED, PLAX chordal          (L)    39.5  mm     43 - 52  LV ID, ES, PLAX chordal                 25.6  mm     23 - 38  LV fx shortening, PLAX chordal          35    %      >=29  LV PW thickness, ED                     10.4  mm     ----------  IVS/LV PW ratio, ED                     0.96         <=1.3  Stroke volume, 2D                       118   ml     ----------  Stroke volume/bsa, 2D                   58    ml/m^2 ----------  LV ejection fraction, 1-p A4C           58    %      ----------  LV end-diastolic volume, 2-p            127   ml     ----------  LV end-systolic volume, 2-p             49    ml     ----------  LV ejection fraction, 2-p               62    %      ----------  Stroke volume, 2-p                      78    ml     ----------  LV end-diastolic volume/bsa, 2-p        62    ml/m^2 ----------  LV end-systolic volume/bsa, 2-p         24    ml/m^2 ----------  Stroke volume/bsa, 2-p                  38.4  ml/m^2 ----------  LV e&', lateral                          10.9  cm/s   ----------  LV E/e&', lateral                        5.9          ----------  LV e&', medial                           5.33  cm/s   ----------  LV E/e&', medial                         12.06        ----------  LV e&', average                          8.12  cm/s   ----------  LV E/e&', average                        7.92         ----------  Longitudinal strain, TDI                23    %      ----------    Ventricular septum                      Value        Reference  IVS thickness, ED                       10    mm     ----------    LVOT                                    Value        Reference  LVOT ID, S                              23    mm     ----------  LVOT area                               4.15  cm^2   ----------  LVOT peak velocity, S                   110   cm/s   ----------  LVOT mean velocity, S                   67.4  cm/s   ----------  LVOT VTI, S                             28.4  cm     ----------  LVOT peak gradient, S                   5     mm Hg  ----------    Aorta                                   Value        Reference  Aortic root ID, ED                      41    mm     ----------    Left atrium                             Value        Reference  LA ID, A-P, ES                          39    mm     ----------  LA ID/bsa, A-P                          1.91  cm/m^2 <=2.2  LA volume, ES,  1-p A4C                  75.2  ml     ----------  LA volume/bsa, ES, 1-p A4C              36.9  ml/m^2 ----------  LA volume, ES, 1-p A2C                  53.9  ml     ----------  LA volume/bsa, ES, 1-p A2C              26.5  ml/m^2 ----------    Mitral valve                            Value        Reference  Mitral E-wave peak velocity             64.3  cm/s   ----------  Mitral A-wave peak velocity             63.8  cm/s   ----------  Mitral deceleration time         (H)    289   ms     150 - 230  Mitral E/A ratio, peak                  1            ----------    Pulmonary arteries                      Value        Reference  PA pressure, S, DP                      29    mm Hg  <=30    Tricuspid valve                         Value        Reference  Tricuspid regurg peak velocity          228   cm/s   ----------  Tricuspid peak RV-RA gradient            21    mm Hg  ----------    Right atrium                            Value        Reference  RA ID, S-I, ES, A4C              (H)    49.4  mm     34 - 49  RA area, ES, A4C                        17.1  cm^2   8.3 - 19.5  RA volume, ES, A/L                      47.7  ml     ----------  RA volume/bsa, ES, A/L                  23.4  ml/m^2 ----------    Systemic veins  Value        Reference  Estimated CVP                           8     mm Hg  ----------    Right ventricle                         Value        Reference  TAPSE                                   24.4  mm     ----------  RV pressure, S, DP                      29    mm Hg  <=30  RV s&', lateral, S                       15.1  cm/s   ----------  Legend: (L)  and  (H)  mark values outside specified reference range.  ------------------------------------------------------------------- Prepared and Electronically Authenticated by  Ena Dawley, M.D. 2018-09-20T13:28:44   Assessment / Plan:    Diffuse Coronary Artery Disease with unstable angina on admission. Films have been reviewed, he has CADdisease but unclear what is the culprit lesion. I reviewed the cath films with Dr. Burt Knack we both agree with the patient's continued anginal symptoms, positive FFR, proceeding with coronary artery bypass grafting has the greatest chance of relief of symptoms for the patient. Risks and options of surgery have been reviewed with the patient and his family in detail including reviewing the cine films with them.  The goals risks and alternatives of the planned surgical procedure Procedure(s): CORONARY ARTERY BYPASS GRAFTING (CABG) (N/A) TRANSESOPHAGEAL ECHOCARDIOGRAM (TEE) (N/A)  have been discussed with the patient in detail. The risks of the procedure including death, infection, stroke, myocardial infarction, bleeding, blood transfusion have all been discussed specifically.  I have quoted Hunter Mcbride. a 3  % of perioperative mortality and a complication rate as high as 40 %. The patient's questions have been answered.Hunter Mcbride. is willing  to proceed with the planned procedure.   Grace Isaac MD      [address] [place] [address] Office [phone]   Beeper [phone]  11/07/2016 7:14 AM

## 2016-11-07 NOTE — Progress Notes (Signed)
Patient ID: Hunter Mcbride., male   DOB: 05-05-1945, 71 y.o.   MRN: [id number] EVENING ROUNDS NOTE :     [address]       [place] [address]             [phone]                 Day of Surgery Procedure(s) (LRB): CORONARY ARTERY BYPASS GRAFTING (CABG) x 5 (LIMA to DISTAL LAD, SVG to DIAGONAL, SVG to CIRCUMFLEX, and SVG SEQUENTIALLY to PLB and DISTAL PDA) with EVH of the RIGHT GREATER SAPHENOUS VEIN and LEFT INTERNAL MAMMARY ARTERY HARVEST (N/A) TRANSESOPHAGEAL ECHOCARDIOGRAM (TEE) (N/A)  Total Length of Stay:  LOS: 0 days  BP 121/73   Pulse 88   Temp 100 F (37.8 C)   Resp (!) 22   Ht 5\' 9"  (1.753 m)   Wt 187 lb 6.3 oz (85 kg)   SpO2 96%   BMI 27.67 kg/m   .Intake/Output      09/24 0701 - 09/25 0700   I.V. (mL/kg) 2783.6 (32.7)   Blood 875   IV Piggyback 1600   Total Intake(mL/kg) 5258.6 (61.9)   Urine (mL/kg/hr) 2025 (2)   Blood 1200   Chest Tube 420   Total Output 3645   Net +1613.6         . sodium chloride 20 mL/hr at 11/07/16 1700  . [START ON 11/08/2016] sodium chloride    . sodium chloride 10 mL/hr at 11/07/16 1700  . albumin human    . cefUROXime (ZINACEF)  IV Stopped (11/07/16 1752)  . dexmedetomidine (PRECEDEX) IV infusion 0.2 mcg/kg/hr (11/07/16 1800)  . famotidine (PEPCID) IV Stopped (11/07/16 1515)  . insulin (NOVOLIN-R) infusion 0.7 Units/hr (11/07/16 1800)  . lactated ringers    . lactated ringers 20 mL/hr at 11/07/16 1700  . lactated ringers 20 mL/hr at 11/07/16 1700  . magnesium sulfate 4 g (11/07/16 1504)  . nitroGLYCERIN 70 mcg/min (11/07/16 1840)  . phenylephrine (NEO-SYNEPHRINE) Adult infusion Stopped (11/07/16 1455)  . vancomycin       Lab Results  Component Value Date   WBC 5.6 11/07/2016   HGB 10.8 (L) 11/07/2016   HCT 31.4 (L) 11/07/2016   PLT 81 (L) 11/07/2016   GLUCOSE 102 (H) 11/07/2016   CHOL 84 10/28/2016   TRIG 31 10/28/2016   HDL 36 (L) 10/28/2016   LDLCALC 42 10/28/2016   ALT 44 11/03/2016   AST 27 11/03/2016   NA 142 11/07/2016   K 3.4 (L) 11/07/2016   CL 103 11/07/2016   CREATININE 0.60 (L) 11/07/2016   BUN 8 11/07/2016   CO2 19 (L) 11/03/2016   TSH 2.009 06/06/2016   INR 1.51 11/07/2016   HGBA1C 7.5 (H) 11/03/2016   now extubated postop, neuro intact Not bleeding   Grace Isaac MD  Beeper [phone] Office [phone] 11/07/2016 7:03 PM

## 2016-11-07 NOTE — Anesthesia Procedure Notes (Signed)
Central Venous Catheter Insertion Performed by: Roberts Gaudy, anesthesiologist Start/End9/24/2018 7:00 AM, 11/07/2016 7:10 AM Preanesthetic checklist: patient identified, IV checked, site marked, risks and benefits discussed, surgical consent, monitors and equipment checked, pre-op evaluation and timeout performed Position: supine Patient sedated Hand hygiene performed , maximum sterile barriers used  and Seldinger technique used Catheter size: 9 Fr PA cath was placed.Procedure performed using ultrasound guided technique. Ultrasound Notes:anatomy identified, needle tip was noted to be adjacent to the nerve/plexus identified and no ultrasound evidence of intravascular and/or intraneural injection Attempts: 1 Following insertion, line sutured, dressing applied and Biopatch. Post procedure assessment: blood return through all ports, free fluid flow and no air  Patient tolerated the procedure well with no immediate complications.

## 2016-11-07 NOTE — Anesthesia Procedure Notes (Signed)
Procedure Name: Intubation Date/Time: 11/07/2016 7:55 AM Performed by: Izora Gala Pre-anesthesia Checklist: Emergency Drugs available, Patient identified, Suction available and Patient being monitored Patient Re-evaluated:Patient Re-evaluated prior to induction Oxygen Delivery Method: Circle system utilized Preoxygenation: Pre-oxygenation with 100% oxygen Induction Type: IV induction Ventilation: Mask ventilation without difficulty and Oral airway inserted - appropriate to patient size Laryngoscope Size: Miller and 3 Grade View: Grade I Tube type: Oral Tube size: 7.5 mm Number of attempts: 1 Airway Equipment and Method: Stylet Placement Confirmation: ETT inserted through vocal cords under direct vision,  positive ETCO2 and breath sounds checked- equal and bilateral

## 2016-11-07 NOTE — Anesthesia Procedure Notes (Signed)
Arterial Line Insertion Start/End9/24/2018 7:05 AM Performed by: Neldon Newport, CRNA  Patient location: Pre-op. Lidocaine 1% used for infiltration Right, radial was placed Catheter size: 20 G Hand hygiene performed  and maximum sterile barriers used   Procedure performed without using ultrasound guided technique.

## 2016-11-07 NOTE — Anesthesia Preprocedure Evaluation (Signed)
Anesthesia Evaluation  Patient identified by MRN, date of birth, ID band Patient awake    Reviewed: Allergy & Precautions, Patient's Chart, lab work & pertinent test results  Airway Mallampati: I  TM Distance: >3 FB Neck ROM: Full    Dental   Pulmonary sleep apnea ,    Pulmonary exam normal        Cardiovascular hypertension, + CAD and + Past MI  Normal cardiovascular exam     Neuro/Psych    GI/Hepatic GERD  Medicated and Controlled,  Endo/Other  diabetes, Type 2, Oral Hypoglycemic Agents  Renal/GU      Musculoskeletal   Abdominal   Peds  Hematology   Anesthesia Other Findings   Reproductive/Obstetrics                             Anesthesia Physical Anesthesia Plan  ASA: III  Anesthesia Plan: General   Post-op Pain Management:    Induction: Intravenous  PONV Risk Score and Plan: 2 and Ondansetron and Dexamethasone  Airway Management Planned: Oral ETT  Additional Equipment: Arterial line, CVP, PA Cath, TEE and Ultrasound Guidance Line Placement  Intra-op Plan:   Post-operative Plan: Post-operative intubation/ventilation  Informed Consent: I have reviewed the patients History and Physical, chart, labs and discussed the procedure including the risks, benefits and alternatives for the proposed anesthesia with the patient or authorized representative who has indicated his/her understanding and acceptance.     Plan Discussed with: CRNA and Surgeon  Anesthesia Plan Comments:         Anesthesia Quick Evaluation

## 2016-11-07 NOTE — Procedures (Signed)
Extubation Procedure Note  Patient Details:   Name: Hunter Mcbride. DOB: Nov 12, 1945 MRN: [id number]   Airway Documentation:     Evaluation  O2 sats: stable throughout Complications: No apparent complications Patient did tolerate procedure well. Bilateral Breath Sounds: Clear, Diminished   Yes   Positive cuff leak noted, VC 820, NIF - 22.  Pt placed on [place] 3 L with humidity, no stridor noted.  Hunter Mcbride 11/07/2016, 7:24 PM

## 2016-11-07 NOTE — Anesthesia Postprocedure Evaluation (Signed)
Anesthesia Post Note  Patient: Hunter Mcbride.  Procedure(s) Performed: Procedure(s) (LRB): CORONARY ARTERY BYPASS GRAFTING (CABG) x 5 (LIMA to DISTAL LAD, SVG to DIAGONAL, SVG to CIRCUMFLEX, and SVG SEQUENTIALLY to PLB and DISTAL PDA) with EVH of the RIGHT GREATER SAPHENOUS VEIN and LEFT INTERNAL MAMMARY ARTERY HARVEST (N/A) TRANSESOPHAGEAL ECHOCARDIOGRAM (TEE) (N/A)     Patient location during evaluation: SICU Anesthesia Type: General Level of consciousness: sedated Pain management: pain level controlled Vital Signs Assessment: post-procedure vital signs reviewed and stable Respiratory status: patient remains intubated per anesthesia plan Cardiovascular status: stable Postop Assessment: no apparent nausea or vomiting Anesthetic complications: no    Last Vitals:  Vitals:   11/07/16 0549 11/07/16 1424  BP: (!) 142/80 (!) 144/72  Pulse: (!) 52 90  Resp: 18 12  Temp: 36.6 C   SpO2: 99% 100%    Last Pain:  Vitals:   11/07/16 0549  TempSrc: Oral                 Quintara Bost DAVID

## 2016-11-07 NOTE — Brief Op Note (Addendum)
      [address]       [place] [address]             [phone]      11/07/2016  2:41 PM  PATIENT:  Hunter Mcbride.  70 y.o. male  PRE-OPERATIVE DIAGNOSIS:  CAD  POST-OPERATIVE DIAGNOSIS:  CAD  PROCEDURE:  TRANSESOPHAGEAL ECHOCARDIOGRAM (TEE), MEDIAN STERNOTOMY for CORONARY ARTERY BYPASS GRAFTING (CABG) x 5 (LIMA to DISTAL LAD, SVG to DIAGONAL, SVG to CIRCUMFLEX, and SVG SEQUENTIALLY to PLB and DISTAL PDA) with EVH of the RIGHT GREATER SAPHENOUS VEIN and LEFT INTERNAL MAMMARY ARTERY HARVEST   SURGEON:  Surgeon(s) and Role:    * Grace Isaac, MD - Primary  PHYSICIAN ASSISTANT: Lars Pinks PA-C  ANESTHESIA:   general  EBL:  Total I/O In: 5537 [I.V.:2450; Blood:875; IV Piggyback:750] Out: 4827 [Urine:550; Blood:1200]  DRAINS: Chest tubes placed in the mediastinal and pleural spaces   COUNTS CORRECT:  YES  DICTATION: .Dragon Dictation  PLAN OF CARE: Admit to inpatient   PATIENT DISPOSITION:  ICU - intubated and hemodynamically stable.   Delay start of Pharmacological VTE agent (>24hrs) due to surgical blood loss or risk of bleeding: yes  BASELINE WEIGHT: 84.8 kg

## 2016-11-08 ENCOUNTER — Encounter (HOSPITAL_COMMUNITY): Payer: Self-pay | Admitting: Cardiothoracic Surgery

## 2016-11-08 ENCOUNTER — Inpatient Hospital Stay (HOSPITAL_COMMUNITY): Payer: PPO

## 2016-11-08 LAB — GLUCOSE, CAPILLARY
Glucose-Capillary: 101 mg/dL — ABNORMAL HIGH (ref 65–99)
Glucose-Capillary: 102 mg/dL — ABNORMAL HIGH (ref 65–99)
Glucose-Capillary: 103 mg/dL — ABNORMAL HIGH (ref 65–99)
Glucose-Capillary: 104 mg/dL — ABNORMAL HIGH (ref 65–99)
Glucose-Capillary: 105 mg/dL — ABNORMAL HIGH (ref 65–99)
Glucose-Capillary: 114 mg/dL — ABNORMAL HIGH (ref 65–99)
Glucose-Capillary: 116 mg/dL — ABNORMAL HIGH (ref 65–99)
Glucose-Capillary: 116 mg/dL — ABNORMAL HIGH (ref 65–99)
Glucose-Capillary: 126 mg/dL — ABNORMAL HIGH (ref 65–99)
Glucose-Capillary: 134 mg/dL — ABNORMAL HIGH (ref 65–99)
Glucose-Capillary: 141 mg/dL — ABNORMAL HIGH (ref 65–99)
Glucose-Capillary: 157 mg/dL — ABNORMAL HIGH (ref 65–99)
Glucose-Capillary: 165 mg/dL — ABNORMAL HIGH (ref 65–99)
Glucose-Capillary: 93 mg/dL (ref 65–99)
Glucose-Capillary: 98 mg/dL (ref 65–99)

## 2016-11-08 LAB — BASIC METABOLIC PANEL
ANION GAP: 6 (ref 5–15)
BUN: 8 mg/dL (ref 6–20)
CALCIUM: 7.6 mg/dL — AB (ref 8.9–10.3)
CO2: 22 mmol/L (ref 22–32)
CREATININE: 0.87 mg/dL (ref 0.61–1.24)
Chloride: 109 mmol/L (ref 101–111)
GFR calc Af Amer: >60 mL/min (ref >60)
GLUCOSE: 101 mg/dL — AB (ref 65–99)
Potassium: 4.1 mmol/L (ref 3.5–5.1)
Sodium: 137 mmol/L (ref 135–145)

## 2016-11-08 LAB — CBC
HCT: 29.9 % — ABNORMAL LOW (ref 39.0–52.0)
HEMATOCRIT: 28 % — AB (ref 39.0–52.0)
HEMATOCRIT: 30.8 % — AB (ref 39.0–52.0)
HEMOGLOBIN: 10 g/dL — AB (ref 13.0–17.0)
Hemoglobin: 9.5 g/dL — ABNORMAL LOW (ref 13.0–17.0)
Hemoglobin: 10.1 g/dL — ABNORMAL LOW (ref 13.0–17.0)
MCH: 29.8 pg (ref 26.0–34.0)
MCH: 29.8 pg (ref 26.0–34.0)
MCH: 29 pg (ref 26.0–34.0)
MCHC: 33.9 g/dL (ref 30.0–36.0)
MCHC: 33.8 g/dL (ref 30.0–36.0)
MCHC: 32.5 g/dL (ref 30.0–36.0)
MCV: 87.8 fL (ref 78.0–100.0)
MCV: 88.2 fL (ref 78.0–100.0)
MCV: 89.3 fL (ref 78.0–100.0)
PLATELETS: 86 10*3/uL — AB (ref 150–400)
Platelets: 110 10*3/uL — ABNORMAL LOW (ref 150–400)
Platelets: 113 10*3/uL — ABNORMAL LOW (ref 150–400)
RBC: 3.19 MIL/uL — ABNORMAL LOW (ref 4.22–5.81)
RBC: 3.39 MIL/uL — ABNORMAL LOW (ref 4.22–5.81)
RBC: 3.45 MIL/uL — AB (ref 4.22–5.81)
RDW: 14.3 % (ref 11.5–15.5)
RDW: 14.5 % (ref 11.5–15.5)
RDW: 14.6 % (ref 11.5–15.5)
WBC: 7 10*3/uL (ref 4.0–10.5)
WBC: 8.9 10*3/uL (ref 4.0–10.5)
WBC: 8.3 10*3/uL (ref 4.0–10.5)

## 2016-11-08 LAB — CREATININE, SERUM
Creatinine, Ser: 0.89 mg/dL (ref 0.61–1.24)
Creatinine, Ser: 0.99 mg/dL (ref 0.61–1.24)
GFR calc Af Amer: >60 mL/min (ref >60)
GFR calc Af Amer: >60 mL/min (ref >60)
GFR calc non Af Amer: >60 mL/min (ref >60)
GFR calc non Af Amer: >60 mL/min (ref >60)

## 2016-11-08 LAB — MAGNESIUM
Magnesium: 2.2 mg/dL (ref 1.7–2.4)
Magnesium: 2.2 mg/dL (ref 1.7–2.4)

## 2016-11-08 LAB — POCT I-STAT, CHEM 8
BUN: 13 mg/dL (ref 6–20)
Calcium, Ion: 1.17 mmol/L (ref 1.15–1.40)
Chloride: 100 mmol/L — ABNORMAL LOW (ref 101–111)
Creatinine, Ser: 0.9 mg/dL (ref 0.61–1.24)
Glucose, Bld: 119 mg/dL — ABNORMAL HIGH (ref 65–99)
HCT: 29 % — ABNORMAL LOW (ref 39.0–52.0)
Hemoglobin: 9.9 g/dL — ABNORMAL LOW (ref 13.0–17.0)
Potassium: 4.1 mmol/L (ref 3.5–5.1)
Sodium: 137 mmol/L (ref 135–145)
TCO2: 25 mmol/L (ref 22–32)

## 2016-11-08 MED ORDER — INSULIN DETEMIR 100 UNIT/ML ~~LOC~~ SOLN
15.0000 [IU] | Freq: Once | SUBCUTANEOUS | Status: AC
  Administered 2016-11-08: 15 [IU] via SUBCUTANEOUS
  Filled 2016-11-08: qty 0.15

## 2016-11-08 MED ORDER — KETOROLAC TROMETHAMINE 15 MG/ML IJ SOLN
15.0000 mg | Freq: Once | INTRAMUSCULAR | Status: AC
  Administered 2016-11-08: 15 mg via INTRAVENOUS
  Filled 2016-11-08: qty 1

## 2016-11-08 MED ORDER — ALPRAZOLAM 0.25 MG PO TABS
0.2500 mg | ORAL_TABLET | Freq: Every evening | ORAL | Status: DC | PRN
  Administered 2016-11-08 – 2016-11-11 (×4): 0.25 mg via ORAL
  Filled 2016-11-08 (×4): qty 1

## 2016-11-08 MED ORDER — INSULIN ASPART 100 UNIT/ML ~~LOC~~ SOLN
0.0000 [IU] | Freq: Three times a day (TID) | SUBCUTANEOUS | Status: DC
  Administered 2016-11-08: 2 [IU] via SUBCUTANEOUS
  Administered 2016-11-08: 4 [IU] via SUBCUTANEOUS
  Administered 2016-11-09: 2 [IU] via SUBCUTANEOUS
  Administered 2016-11-09 (×3): 4 [IU] via SUBCUTANEOUS
  Administered 2016-11-10: 2 [IU] via SUBCUTANEOUS
  Administered 2016-11-10: 4 [IU] via SUBCUTANEOUS
  Administered 2016-11-10 – 2016-11-12 (×4): 2 [IU] via SUBCUTANEOUS

## 2016-11-08 MED ORDER — FENTANYL CITRATE (PF) 100 MCG/2ML IJ SOLN
50.0000 ug | INTRAMUSCULAR | Status: DC | PRN
  Administered 2016-11-09: 50 ug via INTRAVENOUS
  Filled 2016-11-08: qty 2

## 2016-11-08 MED ORDER — CHLORHEXIDINE GLUCONATE CLOTH 2 % EX PADS
6.0000 | MEDICATED_PAD | Freq: Every day | CUTANEOUS | Status: DC
  Administered 2016-11-09: 6 via TOPICAL

## 2016-11-08 MED ORDER — INSULIN DETEMIR 100 UNIT/ML ~~LOC~~ SOLN
15.0000 [IU] | Freq: Every day | SUBCUTANEOUS | Status: DC
  Administered 2016-11-09: 15 [IU] via SUBCUTANEOUS
  Filled 2016-11-08 (×2): qty 0.15

## 2016-11-08 MED ORDER — ENOXAPARIN SODIUM 30 MG/0.3ML ~~LOC~~ SOLN
30.0000 mg | Freq: Every day | SUBCUTANEOUS | Status: DC
  Administered 2016-11-08 – 2016-11-11 (×4): 30 mg via SUBCUTANEOUS
  Filled 2016-11-08 (×4): qty 0.3

## 2016-11-08 NOTE — Progress Notes (Signed)
Patient ID: [name], male   DOB: [birth year], 71 y.o.   MRN: [id number] [facility] DAILY ICU PROGRESS NOTE                   [address]            [place] [address]          [phone]   1 Day Post-Op Procedure(s) (LRB): CORONARY ARTERY BYPASS GRAFTING (CABG) x 5 (LIMA to DISTAL LAD, SVG to DIAGONAL, SVG to CIRCUMFLEX, and SVG SEQUENTIALLY to PLB and DISTAL PDA) with EVH of the RIGHT GREATER SAPHENOUS VEIN and LEFT INTERNAL MAMMARY ARTERY HARVEST (N/A) TRANSESOPHAGEAL ECHOCARDIOGRAM (TEE) (N/A)  Total Length of Stay:  LOS: 1 day   Subjective: Extubated, awake and alert, neuro intact  Objective: Vital signs in last 24 hours: Temp:  [95.9 F (35.5 C)-100.6 F (38.1 C)] 99 F (37.2 C) (09/25 0821) Pulse Rate:  [70-96] 89 (09/25 0821) Cardiac Rhythm: Normal sinus rhythm;Atrial paced (09/25 0400) Resp:  [12-30] 17 (09/25 0821) BP: (78-148)/(56-100) 139/71 (09/25 0800) SpO2:  [96 %-100 %] 99 % (09/25 0821) Arterial Line BP: (80-199)/(44-86) 166/63 (09/25 0821) FiO2 (%):  [40 %-50 %] 40 % (09/24 1725) Weight:  [187 lb 6.3 oz (85 kg)-197 lb 1.5 oz (89.4 kg)] 197 lb 1.5 oz (89.4 kg) (09/25 0400)  Filed Weights   11/07/16 0549 11/07/16 1500 11/08/16 0400  Weight: 187 lb (84.8 kg) 187 lb 6.3 oz (85 kg) 197 lb 1.5 oz (89.4 kg)    Weight change: 6.3 oz (0.177 kg)   Hemodynamic parameters for last 24 hours: PAP: (17-41)/(0-23) 24/8 CO:  [4.3 L/min-8.2 L/min] 7.8 L/min CI:  [0.2 L/min/m2-4.1 L/min/m2] 3.9 L/min/m2  Intake/Output from previous day: 09/24 0701 - 09/25 0700 In: 6797.2 [I.V.:3572.2; Blood:875; IV Piggyback:2350] Out: 8413 [Urine:2980; Blood:1200; Chest Tube:1060]  Intake/Output this shift: Total I/O In: 157.5 [P.O.:100; I.V.:57.5] Out: 275 [Urine:225; Chest Tube:50]  Current Meds: Scheduled Meds: . acetaminophen  1,000 mg Oral Q6H   Or  . acetaminophen (TYLENOL) oral liquid 160 mg/5 mL  1,000 mg Per Tube Q6H  . aspirin EC  325 mg Oral  Daily   Or  . aspirin  324 mg Per Tube Daily  . atorvastatin  80 mg Oral q1800  . bisacodyl  10 mg Oral Daily   Or  . bisacodyl  10 mg Rectal Daily  . Chlorhexidine Gluconate Cloth  6 each Topical Daily  . docusate sodium  200 mg Oral Daily  . enoxaparin (LOVENOX) injection  30 mg Subcutaneous QHS  . gabapentin  300 mg Oral QHS  . insulin aspart  0-24 Units Subcutaneous TID PC & HS  . insulin detemir  15 Units Subcutaneous Once  . [START ON 11/09/2016] insulin detemir  15 Units Subcutaneous Daily  . insulin regular  0-10 Units Intravenous TID WC  . mouth rinse  15 mL Mouth Rinse BID  . metoprolol tartrate  12.5 mg Oral BID   Or  . metoprolol tartrate  12.5 mg Per Tube BID  . [START ON 11/09/2016] pantoprazole  40 mg Oral Daily  . sodium chloride flush  10-40 mL Intracatheter Q12H  . sodium chloride flush  3 mL Intravenous Q12H   Continuous Infusions: . sodium chloride 20 mL/hr at 11/08/16 0800  . sodium chloride    . sodium chloride 10 mL/hr at 11/08/16 0800  . cefUROXime (ZINACEF)  IV Stopped (11/08/16 0349)  . dexmedetomidine (PRECEDEX) IV infusion Stopped (11/07/16 1840)  .  famotidine (PEPCID) IV Stopped (11/07/16 1515)  . insulin (NOVOLIN-R) infusion Stopped (11/08/16 0600)  . lactated ringers    . lactated ringers 20 mL/hr at 11/08/16 0800  . lactated ringers Stopped (11/07/16 1900)  . nitroGLYCERIN Stopped (11/08/16 0045)  . phenylephrine (NEO-SYNEPHRINE) Adult infusion 5 mcg/min (11/08/16 0800)   PRN Meds:.sodium chloride, lactated ringers, metoprolol tartrate, midazolam, morphine injection, ondansetron (ZOFRAN) IV, oxyCODONE, sodium chloride flush, sodium chloride flush, traMADol  General appearance: alert and cooperative Neurologic: intact Heart: regular rate and rhythm, S1, S2 normal, no murmur, click, rub or gallop Lungs: diminished breath sounds bibasilar Abdomen: soft, non-tender; bowel sounds normal; no masses,  no organomegaly Extremities: extremities normal,  atraumatic, no cyanosis or edema and Homans sign is negative, no sign of DVT Wound: dressing intact  Lab Results: CBC: Recent Labs  11/07/16 1949 11/07/16 2003 11/08/16 0248  WBC 5.2  --  7.0  HGB 10.6* 9.2* 9.5*  HCT 31.3* 27.0* 28.0*  PLT 94*  --  86*   BMET:  Recent Labs  11/07/16 2003 11/08/16 0248  NA 139 137  K 4.2 4.1  CL 107 109  CO2  --  22  GLUCOSE 121* 101*  BUN 8 8  CREATININE 0.60* 0.87  CALCIUM  --  7.6*    CMET: Lab Results  Component Value Date   WBC 7.0 11/08/2016   HGB 9.5 (L) 11/08/2016   HCT 28.0 (L) 11/08/2016   PLT 86 (L) 11/08/2016   GLUCOSE 101 (H) 11/08/2016   CHOL 84 10/28/2016   TRIG 31 10/28/2016   HDL 36 (L) 10/28/2016   LDLCALC 42 10/28/2016   ALT 44 11/03/2016   AST 27 11/03/2016   NA 137 11/08/2016   K 4.1 11/08/2016   CL 109 11/08/2016   CREATININE 0.87 11/08/2016   BUN 8 11/08/2016   CO2 22 11/08/2016   TSH 2.009 06/06/2016   INR 1.51 11/07/2016   HGBA1C 7.5 (H) 11/03/2016      PT/INR:  Recent Labs  11/07/16 1448  LABPROT 18.1*  INR 1.51   Radiology: Dg Chest Port 1 View  Result Date: 11/08/2016 CLINICAL DATA:  CABG.  Chest tube EXAM: PORTABLE CHEST 1 VIEW COMPARISON:  11/07/2016 FINDINGS: Endotracheal tube and NG tube removed. Left chest tube remains in place. No pneumothorax. Swan-Ganz catheter in the pulmonary outflow tract. Left lower lobe atelectasis and small left effusion unchanged. Right lung clear. Negative for heart failure IMPRESSION: Endotracheal tube removed. Chest tube in place without pneumothorax Left lower lobe atelectasis and effusion unchanged Electronically Signed   By: Franchot Gallo M.D.   On: 11/08/2016 07:37   Dg Chest Port 1 View  Result Date: 11/07/2016 CLINICAL DATA:  Post CABG EXAM: PORTABLE CHEST 1 VIEW COMPARISON:  Portable exam at 1423 hrs compared to 11/03/2016 FINDINGS: Tip of endotracheal tube projects 3.8 cm above carina. Tip of nasogastric tube projects over stomach. Tip of RIGHT  jugular Lauralee Evener catheter projects over main pulmonary artery. LEFT thoracostomy tube and epicardial pacing wires noted. Mild enlargement of cardiac silhouette post CABG. Coronary arterial calcifications noted. LEFT basilar atelectasis. No gross pleural effusion or pneumothorax. IMPRESSION: Postsurgical changes of CABG as above. Electronically Signed   By: Lavonia Dana M.D.   On: 11/07/2016 14:59     Assessment/Plan: S/P Procedure(s) (LRB): CORONARY ARTERY BYPASS GRAFTING (CABG) x 5 (LIMA to DISTAL LAD, SVG to DIAGONAL, SVG to CIRCUMFLEX, and SVG SEQUENTIALLY to PLB and DISTAL PDA) with EVH of the RIGHT GREATER SAPHENOUS VEIN and  LEFT INTERNAL MAMMARY ARTERY HARVEST (N/A) TRANSESOPHAGEAL ECHOCARDIOGRAM (TEE) (N/A) Mobilize Diuresis Diabetes control d/c tubes/lines See progression orders Expected Acute  Blood - loss Anemia    Grace Isaac 11/08/2016 9:10 AM

## 2016-11-08 NOTE — Plan of Care (Signed)
Problem: Activity: Goal: Risk for activity intolerance will decrease Pt getting out of bed and ambulating in hallway with minimal assist. Tolerating activity well, denies SOB, pain, or dizziness.   Problem: Cardiac: Goal: Hemodynamic stability will improve Outcome: Progressing Neosynephrine discontinued this am, pt continues to be paced AAI at 90. Vital signs remain within parameters.

## 2016-11-08 NOTE — Progress Notes (Signed)
CT surgery p.m. Rounds Patient examined and record reviewed.Hemodynamics stable,labs satisfactory.Patient had stable day.patient with some persistent incisional pain so will change morphine to  fentanyl  for  Prn dosing. Continue current care. Tharon Aquas Trigt III 11/08/2016

## 2016-11-08 NOTE — Progress Notes (Signed)
Chaplain stopped in to visit with patient while rounding the floor.  Family talking about their experience the first time they were here and how much improved their experience was this time.  Family and patient grateful for a wonderful medical team.  Chaplain continuing to provide prayer with family for continued recovery as he walks and begins moving around and about.  Patient is of [employer].  Please page Chaplain as needed for this patient or family.    Chaplain would like to thank the medical team for their care for this patient.    11/08/16 1100  Clinical Encounter Type  Visited With Patient and family together (Spouse and daughter at bedside)  Visit Type Initial;Psychological support;Spiritual support;Social support;Post-op  Referral From Chaplain  Consult/Referral To Chaplain  Spiritual Encounters  Spiritual Needs Prayer

## 2016-11-09 ENCOUNTER — Inpatient Hospital Stay (HOSPITAL_COMMUNITY): Payer: PPO

## 2016-11-09 LAB — BASIC METABOLIC PANEL
Anion gap: 5 (ref 5–15)
BUN: 19 mg/dL (ref 6–20)
CO2: 25 mmol/L (ref 22–32)
Calcium: 7.9 mg/dL — ABNORMAL LOW (ref 8.9–10.3)
Chloride: 103 mmol/L (ref 101–111)
Creatinine, Ser: 1.3 mg/dL — ABNORMAL HIGH (ref 0.61–1.24)
GFR calc Af Amer: >60 mL/min (ref >60)
GFR calc non Af Amer: 54 mL/min — ABNORMAL LOW (ref >60)
Glucose, Bld: 110 mg/dL — ABNORMAL HIGH (ref 65–99)
Potassium: 4.1 mmol/L (ref 3.5–5.1)
Sodium: 133 mmol/L — ABNORMAL LOW (ref 135–145)

## 2016-11-09 LAB — CBC
HCT: 28 % — ABNORMAL LOW (ref 39.0–52.0)
Hemoglobin: 9.2 g/dL — ABNORMAL LOW (ref 13.0–17.0)
MCH: 29.6 pg (ref 26.0–34.0)
MCHC: 32.9 g/dL (ref 30.0–36.0)
MCV: 90 fL (ref 78.0–100.0)
Platelets: 88 10*3/uL — ABNORMAL LOW (ref 150–400)
RBC: 3.11 MIL/uL — ABNORMAL LOW (ref 4.22–5.81)
RDW: 14.8 % (ref 11.5–15.5)
WBC: 5.2 10*3/uL (ref 4.0–10.5)

## 2016-11-09 LAB — GLUCOSE, CAPILLARY
Glucose-Capillary: 137 mg/dL — ABNORMAL HIGH (ref 65–99)
Glucose-Capillary: 197 mg/dL — ABNORMAL HIGH (ref 65–99)
Glucose-Capillary: 173 mg/dL — ABNORMAL HIGH (ref 65–99)
Glucose-Capillary: 184 mg/dL — ABNORMAL HIGH (ref 65–99)

## 2016-11-09 MED ORDER — ALUM & MAG HYDROXIDE-SIMETH 200-200-20 MG/5ML PO SUSP: 15.0000 mL | ORAL | Status: DC | PRN

## 2016-11-09 MED ORDER — ASPIRIN EC 81 MG PO TBEC
81.0000 mg | DELAYED_RELEASE_TABLET | Freq: Every day | ORAL | Status: DC
  Administered 2016-11-09 – 2016-11-11 (×3): 81 mg via ORAL
  Filled 2016-11-09 (×3): qty 1

## 2016-11-09 MED ORDER — INFLUENZA VAC SPLIT HIGH-DOSE 0.5 ML IM SUSY: 0.5000 mL | PREFILLED_SYRINGE | INTRAMUSCULAR | Status: DC

## 2016-11-09 MED ORDER — SODIUM CHLORIDE 0.9% FLUSH: 3.0000 mL | INTRAVENOUS | Status: DC | PRN

## 2016-11-09 MED ORDER — MOVING RIGHT ALONG BOOK
Freq: Once | Status: AC
  Administered 2016-11-09: 1
  Filled 2016-11-09: qty 1

## 2016-11-09 MED ORDER — SODIUM CHLORIDE 0.9 % IV SOLN: 250.0000 mL | INTRAVENOUS | Status: DC | PRN

## 2016-11-09 MED ORDER — BISACODYL 10 MG RE SUPP: 10.0000 mg | Freq: Every day | RECTAL | Status: DC | PRN

## 2016-11-09 MED ORDER — ONDANSETRON HCL 4 MG PO TABS
4.0000 mg | ORAL_TABLET | Freq: Four times a day (QID) | ORAL | Status: DC | PRN
  Administered 2016-11-12: 4 mg via ORAL
  Filled 2016-11-09: qty 1

## 2016-11-09 MED ORDER — BISACODYL 5 MG PO TBEC
10.0000 mg | DELAYED_RELEASE_TABLET | Freq: Every day | ORAL | Status: DC | PRN
  Administered 2016-11-10: 10 mg via ORAL
  Filled 2016-11-09: qty 2

## 2016-11-09 MED ORDER — CLOPIDOGREL BISULFATE 75 MG PO TABS
75.0000 mg | ORAL_TABLET | Freq: Every day | ORAL | Status: DC
  Administered 2016-11-10 – 2016-11-12 (×3): 75 mg via ORAL
  Filled 2016-11-09 (×3): qty 1

## 2016-11-09 MED ORDER — ATENOLOL 25 MG PO TABS
50.0000 mg | ORAL_TABLET | Freq: Every day | ORAL | Status: DC
  Administered 2016-11-09 – 2016-11-11 (×3): 50 mg via ORAL
  Filled 2016-11-09 (×3): qty 2

## 2016-11-09 MED ORDER — METFORMIN HCL ER 500 MG PO TB24
1000.0000 mg | ORAL_TABLET | Freq: Two times a day (BID) | ORAL | Status: DC
  Administered 2016-11-10 – 2016-11-12 (×5): 1000 mg via ORAL
  Filled 2016-11-09 (×7): qty 2

## 2016-11-09 MED ORDER — SODIUM CHLORIDE 0.9% FLUSH
3.0000 mL | Freq: Two times a day (BID) | INTRAVENOUS | Status: DC
  Administered 2016-11-09 – 2016-11-11 (×4): 3 mL via INTRAVENOUS

## 2016-11-09 MED ORDER — CHOLECALCIFEROL 10 MCG (400 UNIT) PO TABS
400.0000 [IU] | ORAL_TABLET | Freq: Every day | ORAL | Status: DC
Start: 2016-11-09 — End: 2016-11-12
  Administered 2016-11-09 – 2016-11-12 (×4): 400 [IU] via ORAL
  Filled 2016-11-09 (×4): qty 1

## 2016-11-09 MED ORDER — ONDANSETRON HCL 4 MG/2ML IJ SOLN
4.0000 mg | Freq: Four times a day (QID) | INTRAMUSCULAR | Status: DC | PRN
  Administered 2016-11-09 – 2016-11-10 (×2): 4 mg via INTRAVENOUS
  Filled 2016-11-09 (×4): qty 2

## 2016-11-09 MED ORDER — ACETAMINOPHEN 325 MG PO TABS
650.0000 mg | ORAL_TABLET | Freq: Four times a day (QID) | ORAL | Status: DC | PRN
  Administered 2016-11-11: 650 mg via ORAL
  Filled 2016-11-09: qty 2

## 2016-11-09 MED ORDER — PANTOPRAZOLE SODIUM 40 MG PO TBEC
40.0000 mg | DELAYED_RELEASE_TABLET | Freq: Every day | ORAL | Status: DC
  Administered 2016-11-10 – 2016-11-12 (×3): 40 mg via ORAL
  Filled 2016-11-09 (×3): qty 1

## 2016-11-09 MED ORDER — TRAMADOL HCL 50 MG PO TABS
50.0000 mg | ORAL_TABLET | ORAL | Status: DC | PRN
  Administered 2016-11-09 – 2016-11-10 (×4): 100 mg via ORAL
  Administered 2016-11-10: 50 mg via ORAL
  Administered 2016-11-11 (×2): 100 mg via ORAL
  Filled 2016-11-09 (×7): qty 2

## 2016-11-09 MED ORDER — OXYCODONE HCL 5 MG PO TABS
5.0000 mg | ORAL_TABLET | ORAL | Status: DC | PRN
  Administered 2016-11-09 – 2016-11-12 (×4): 10 mg via ORAL
  Filled 2016-11-09 (×5): qty 2

## 2016-11-09 MED ORDER — POLYETHYLENE GLYCOL 3350 17 G PO PACK
17.0000 g | PACK | Freq: Every day | ORAL | Status: DC | PRN
  Administered 2016-11-10: 17 g via ORAL
  Filled 2016-11-09: qty 1

## 2016-11-09 MED FILL — Potassium Chloride Inj 2 mEq/ML: INTRAVENOUS | Qty: 40 | Status: AC

## 2016-11-09 MED FILL — Magnesium Sulfate Inj 50%: INTRAMUSCULAR | Qty: 10 | Status: AC

## 2016-11-09 MED FILL — Heparin Sodium (Porcine) Inj 1000 Unit/ML: INTRAMUSCULAR | Qty: 30 | Status: AC

## 2016-11-09 NOTE — Progress Notes (Signed)
      [address]       [place] [address]             [phone]      POD # 2 CABG  BP 112/63 (BP Location: Left Arm)   Pulse 77   Temp 99.5 F (37.5 C) (Oral)   Resp 18   Ht 5\' 9"  (1.753 m)   Wt 197 lb 8.5 oz (89.6 kg)   SpO2 91%   BMI 29.17 kg/m    Intake/Output Summary (Last 24 hours) at 11/09/16 1743 Last data filed at 11/09/16 1600  Gross per 24 hour  Intake             1300 ml  Output              635 ml  Net              665 ml   Some back pain  Overall doing well.  Revonda Standard Roxan Hockey, MD [facility] [phone]

## 2016-11-09 NOTE — Progress Notes (Addendum)
Patient ID: Hunter Mcbride., male   DOB: May 20, 1945, 70 y.o.   MRN: [id number] TCTS DAILY ICU PROGRESS NOTE                   [address]            [place] [address]          [phone]   2 Days Post-Op Procedure(s) (LRB): CORONARY ARTERY BYPASS GRAFTING (CABG) x 5 (LIMA to DISTAL LAD, SVG to DIAGONAL, SVG to CIRCUMFLEX, and SVG SEQUENTIALLY to PLB and DISTAL PDA) with EVH of the RIGHT GREATER SAPHENOUS VEIN and LEFT INTERNAL MAMMARY ARTERY HARVEST (N/A) TRANSESOPHAGEAL ECHOCARDIOGRAM (TEE) (N/A)  Total Length of Stay:  LOS: 2 days   Subjective: Awake and alert , walked  around unit this am   Objective: Vital signs in last 24 hours: Temp:  [98 F (36.7 C)-99.3 F (37.4 C)] 98 F (36.7 C) (09/26 0357) Pulse Rate:  [88-91] 89 (09/26 0800) Cardiac Rhythm: Atrial paced (09/26 0400) Resp:  [10-25] 18 (09/26 0800) BP: (93-136)/(56-90) 116/77 (09/26 0800) SpO2:  [92 %-100 %] 95 % (09/26 0800) Arterial Line BP: (146-154)/(61-63) 146/63 (09/25 1000) Weight:  [197 lb 8.5 oz (89.6 kg)] 197 lb 8.5 oz (89.6 kg) (09/26 0400)  Filed Weights   11/07/16 1500 11/08/16 0400 11/09/16 0400  Weight: 187 lb 6.3 oz (85 kg) 197 lb 1.5 oz (89.4 kg) 197 lb 8.5 oz (89.6 kg)    Weight change: 10 lb 2.3 oz (4.6 kg)   Hemodynamic parameters for last 24 hours: PAP: (19-31)/(6-16) 31/16  Intake/Output from previous day: 09/25 0701 - 09/26 0700 In: 1466.8 [P.O.:820; I.V.:596.8; IV Piggyback:50] Out: 1395 [Urine:835; Chest Tube:560]  Intake/Output this shift: Total I/O In: -  Out: 75 [Urine:75]  Current Meds: Scheduled Meds: . acetaminophen  1,000 mg Oral Q6H   Or  . acetaminophen (TYLENOL) oral liquid 160 mg/5 mL  1,000 mg Per Tube Q6H  . aspirin EC  325 mg Oral Daily   Or  . aspirin  324 mg Per Tube Daily  . atorvastatin  80 mg Oral q1800  . bisacodyl  10 mg Oral Daily   Or  . bisacodyl  10 mg Rectal Daily  . Chlorhexidine Gluconate Cloth  6 each Topical Daily  .  docusate sodium  200 mg Oral Daily  . enoxaparin (LOVENOX) injection  30 mg Subcutaneous QHS  . gabapentin  300 mg Oral QHS  . [START ON 11/10/2016] Influenza vac split quadrivalent PF  0.5 mL Intramuscular Tomorrow-1000  . insulin aspart  0-24 Units Subcutaneous TID PC & HS  . insulin detemir  15 Units Subcutaneous Daily  . mouth rinse  15 mL Mouth Rinse BID  . metoprolol tartrate  12.5 mg Oral BID   Or  . metoprolol tartrate  12.5 mg Per Tube BID  . pantoprazole  40 mg Oral Daily  . sodium chloride flush  10-40 mL Intracatheter Q12H  . sodium chloride flush  3 mL Intravenous Q12H   Continuous Infusions: . sodium chloride Stopped (11/08/16 1100)  . sodium chloride    . sodium chloride Stopped (11/08/16 1100)  . dexmedetomidine (PRECEDEX) IV infusion Stopped (11/07/16 1840)  . lactated ringers    . lactated ringers 20 mL/hr at 11/08/16 2000  . lactated ringers Stopped (11/07/16 1900)  . nitroGLYCERIN Stopped (11/08/16 0045)  . phenylephrine (NEO-SYNEPHRINE) Adult infusion Stopped (11/08/16 0900)   PRN Meds:.sodium chloride, ALPRAZolam, fentaNYL (SUBLIMAZE) injection, lactated ringers, metoprolol  tartrate, midazolam, ondansetron (ZOFRAN) IV, oxyCODONE, sodium chloride flush, sodium chloride flush, traMADol  General appearance: alert, cooperative and no distress Neurologic: intact Heart: regular rate and rhythm, S1, S2 normal, no murmur, click, rub or gallop Lungs: diminished breath sounds bibasilar Abdomen: soft, non-tender; bowel sounds normal; no masses,  no organomegaly Extremities: extremities normal, atraumatic, no cyanosis or edema and Homans sign is negative, no sign of DVT Wound: sternum intact  Lab Results: CBC: Recent Labs  11/08/16 1734 11/09/16 0324  WBC 8.3 5.2  HGB 10.0* 9.2*  HCT 30.8* 28.0*  PLT 113* 88*   BMET:  Recent Labs  11/08/16 0248  11/08/16 1709 11/08/16 1734 11/09/16 0324  NA 137  --  137  --  133*  K 4.1  --  4.1  --  4.1  CL 109  --   100*  --  103  CO2 22  --   --   --  25  GLUCOSE 101*  --  119*  --  110*  BUN 8  --  13  --  19  CREATININE 0.87  < > 0.90 0.99 1.30*  CALCIUM 7.6*  --   --   --  7.9*  < > = values in this interval not displayed.  CMET: Lab Results  Component Value Date   WBC 5.2 11/09/2016   HGB 9.2 (L) 11/09/2016   HCT 28.0 (L) 11/09/2016   PLT 88 (L) 11/09/2016   GLUCOSE 110 (H) 11/09/2016   CHOL 84 10/28/2016   TRIG 31 10/28/2016   HDL 36 (L) 10/28/2016   LDLCALC 42 10/28/2016   ALT 44 11/03/2016   AST 27 11/03/2016   NA 133 (L) 11/09/2016   K 4.1 11/09/2016   CL 103 11/09/2016   CREATININE 1.30 (H) 11/09/2016   BUN 19 11/09/2016   CO2 25 11/09/2016   TSH 2.009 06/06/2016   INR 1.51 11/07/2016   HGBA1C 7.5 (H) 11/03/2016      PT/INR:  Recent Labs  11/07/16 1448  LABPROT 18.1*  INR 1.51   Radiology: Dg Chest Port 1 View  Result Date: 11/09/2016 CLINICAL DATA:  Status post CABG 2 days ago EXAM: PORTABLE CHEST 1 VIEW COMPARISON:  Portable chest x-ray of November 08, 2016 FINDINGS: The right lung is adequately inflated and clear. On the left there is persistent retrocardiac density but the interstitial markings have improved. The left chest tube is in stable position. The cardiac silhouette is enlarged. The Swan-Ganz catheter is been removed. The pulmonary vascularity is not engorged. The right internal jugular Cordis sheath tip projects over the proximal SVC. IMPRESSION: Persistent left lower lobe atelectasis. No pneumothorax or significant pleural effusion. Stable enlargement cardiac silhouette without pulmonary vascular congestion or pulmonary edema. Electronically Signed   By: David  Martinique M.D.   On: 11/09/2016 07:32     Assessment/Plan: S/P Procedure(s) (LRB): CORONARY ARTERY BYPASS GRAFTING (CABG) x 5 (LIMA to DISTAL LAD, SVG to DIAGONAL, SVG to CIRCUMFLEX, and SVG SEQUENTIALLY to PLB and DISTAL PDA) with EVH of the RIGHT GREATER SAPHENOUS VEIN and LEFT INTERNAL MAMMARY  ARTERY HARVEST (N/A) TRANSESOPHAGEAL ECHOCARDIOGRAM (TEE) (N/A) Mobilize Diuresis Diabetes control d/c tubes/lines Plan for transfer to step-down: see transfer orders Cr 1.3 835 uop stable  Watch plt counts  Anion gap low , no evidence of euglycemic ketoacidosis , had been on canagliflozin Anastasio Auerbach)   Hunter Mcbride 11/09/2016 8:38 AM

## 2016-11-09 NOTE — Plan of Care (Signed)
Problem: Pain Managment: Goal: General experience of comfort will improve Outcome: Progressing Pain management improved with PRN's.   Problem: Bowel/Gastric: Goal: Will not experience complications related to bowel motility Outcome: Progressing Patient is passing gas but has not had BM post surgery  Problem: Respiratory: Goal: Levels of oxygenation will improve Outcome: Progressing Patient requires 2L [place] when sleeping but able to be on room air when awake.  Goal: Respiratory status will improve Outcome: Progressing Patient uses IS independently and frequently  Problem: Urinary Elimination: Goal: Ability to achieve and maintain adequate renal perfusion and functioning will improve Outcome: Progressing Foley catheter removed, will monitor for UOP & retention

## 2016-11-09 NOTE — Care Management Note (Signed)
Case Management Note  Patient Details  Name: Hunter Mcbride. MRN: [id number] Date of Birth: [birth year]  Subjective/Objective:  From home with spouse, who will be with patient at discharge every day for assistance if needed. POD 2 CABG, ambulated around unit. Cont to diuresis, dc chest tubes, will transfer to SDU.                    Action/Plan: NCM will follow for dc needs.   Expected Discharge Date:                  Expected Discharge Plan:  Home/Self Care  In-House Referral:     Discharge planning Services  CM Consult  Post Acute Care Choice:    Choice offered to:     DME Arranged:    DME Agency:     HH Arranged:    HH Agency:     Status of Service:  In process, will continue to follow  If discussed at [place] Length of Stay Meetings, dates discussed:    Additional Comments:  Zenon Mayo, RN 11/09/2016, 3:23 PM

## 2016-11-09 NOTE — Op Note (Signed)
NAMETEDRICK, PORT                 ACCOUNT NO.:  [id number]  MEDICAL RECORD NO.:  [id number]  LOCATION:  [facility]                         FACILITY:  [facility]  PHYSICIAN:  Lanelle Bal, MD    DATE OF BIRTH:  25-Feb-1945  DATE OF PROCEDURE:  11/07/2016 DATE OF DISCHARGE:                              OPERATIVE REPORT   PREOPERATIVE DIAGNOSIS:  Three-vessel coronary occlusive disease with angina.  POSTOPERATIVE DIAGNOSIS:  Three-vessel coronary occlusive disease with angina.  SURGICAL PROCEDURE:  Coronary artery bypass grafting x5 with the left internal mammary to the distal left anterior descending coronary artery, reverse saphenous vein graft to the diagonal, reverse saphenous vein graft to the first obtuse marginal, sequential reverse saphenous vein graft to the proximal posterior lateral branch and to the distal posterior descending with right leg, thigh, and calf greater saphenous vein harvesting endoscopically.  SURGEON:  Lanelle Bal, M.D.  FIRST ASSISTANT:  Lars Pinks, PA.  BRIEF HISTORY:  The patient is a 71 year old male with Lienhard-standing diabetes, who presents with continued exertionally related anginal pain in spite of numerous previous angioplasties, stents placement, and aggressive medical therapy with risk modification, nitrates, and Ranexa. The patient underwent recent cardiac catheterization that confirmed preserved LV function with moderate narrowing of the left anterior descending in an area of Smoker stenting.  FloWire positive for hemodynamic significance.  High-grade stenosis greater than 80% in a moderate-sized diagonal branch.  The distal circumflex is a very small hypoplastic vessel.  The main lateral supply of the lateral wall is a large obtuse marginal with a proximal 60% to 70% stenosis, borderline FloWire positive.  The patient has diffuse disease throughout the right coronary artery with high-grade stenosis in the mid PD.  Several months ago,  an angioplasty was done without stenting of the proximal PD and PL branches.  After careful review of the patient's history and review of his previous studies with Dr. Burt Knack, it was decided to recommend proceeding with coronary artery bypass grafting because of the patient's continued ongoing symptomatology.  Risks and options were discussed with the patient in detail and he was agreeable and willing to proceed and signed informed consent.  DESCRIPTION OF PROCEDURE:  With Swan-Ganz and arterial line monitors in place, the patient underwent general endotracheal anesthesia without incident.  Skin of the chest and legs were prepped with Betadine and draped in usual sterile manner.  TEE probe was placed and confirmed functioning aortic and mitral valve, tricuspid valve with preserved left ventricular function.  Appropriate time-out was performed and then we proceeded with endoscopic harvesting of the right greater saphenous vein.  Endoscopically, a small incision was made at the knee, also in the groin and distally.  The vein was of adequate quality and caliber. Median sternotomy was performed.  Left internal mammary artery was dissected down as a pedicle graft.  The distal artery was divided and had good free flow.  Pericardium was opened.  Overall, ventricular function appeared preserved.  The patient was systemically heparinized. The ascending aorta was cannulated.  The right atrium was cannulated. An aortic root vent cardioplegia needle was introduced into the ascending aorta.  The patient was placed on cardiopulmonary bypass 2.4 L/min/m2.  Sites of anastomosis were dissected out of the epicardium. The patient's body temperature was cooled to 32 degrees.  Aortic crossclamp was applied and 600 mL of cold blood potassium cardioplegia was administered with diastolic arrest of the heart.  We turned our attention first to the right system.  The distal right coronary artery was severely  diseased and calcified.  The distal part of the posterior descending appeared suitable for bypass.  With the patient's previous history, recent angioplasty in the proximal, posterior lateral, and posterior descending, we decided to place a graft from the proximal posterolateral branch and side-to-side and to the distal posterior descending.  The posterolateral branch was opened and admitted a 1 mm probe distally.  Using a longitudinal side-to-side anastomosis, it was carried out.  The distal extent of the same vein was then carried to the distal posterior descending coronary artery, which was opened.  I admitted a 1 mm probe distally.  The vessel was approximately 1.2 to 1.3 mm in size.  Using a running 7-0 Prolene, distal anastomosis was performed.  The heart was then elevated and the large obtuse marginal, the predominant supplier of the lateral wall was opened and admitted a 1.5 mm probe distally.  Using a running 7-0 Prolene, distal anastomosis with a second reverse saphenous vein graft was carried out.  We then turned our attention to the diagonal coronary artery, which was a smaller thin-walled vessel, but did admit a 1 mm probe distally.  Using a running 8-0 Prolene, the left segment of reverse saphenous vein graft was anastomosed to the diagonal coronary artery.  Intermittently, cold blood cardioplegia was administered down to the vein grafts.  We then turned our attention to the distal third of the LAD.  The distal extent of the stent in the LAD was easily discernible and just past this vessel was opened and admitted a 1.5 mm probe distally.  Using a running 8-0 Prolene, left internal mammary artery was anastomosed to the left anterior descending coronary artery.  With the crossclamp still in place, 3 punch aortotomies were performed and each of the 3 vein grafts were anastomosed to the ascending aorta.  The bulldog was removed from the mammary artery with prompt rise of  myocardial septal temperature. The heart was allowed to passively fill and de-air and the proximal anastomoses were completed.  Aortic cross-clamp was removed.  Total cross-clamp time of 96 minutes.  Sites of anastomosis were inspected and were free of bleeding.  The fascia of the mammary was tacked to the epicardium.  Atrial and ventricular pacing wires were applied with body temperature rewarmed to 37 degrees.  The patient was then ventilated and weaned from cardiopulmonary bypass without difficulty.  He remained hemodynamically stable.  He was decannulated in usual fashion. Protamine sulfate was administered with operative fields hemostatic. Atrial and ventricular pacing wires had been applied.  Graft markers were applied.  A left pleural tube and a Blake mediastinal drain were left in place.  Sternum was closed with #6 stainless steel wire.  Fascia was closed with interrupted 0 Vicryl, running 3-0 Vicryl subcutaneous tissue, and 4-0 subcuticular stitch in skin edges.  Dry dressings were applied.  Sponge and needle count was reported as correct.  At the completion of the procedure, the patient tolerated the procedure without obvious complication.  No blood products were required during the procedure.     Lanelle Bal, MD     EG/MEDQ  D:  11/09/2016  T:  11/09/2016  Job:  [id number]

## 2016-11-09 NOTE — Progress Notes (Addendum)
Chaplain following up with patient and family.  Patient about to get unhooked to a lot of stuff per wife.  Chaplain will visit again this afternoon after unhooking.  :-)  Chaplain stopped back by and visited with patient, wife and daughter.  Very pleasant family.  Compassionate support and comforting conversation provided.    11/09/16 1001  Clinical Encounter Type  Visited With Patient and family together  Visit Type Follow-up;Psychological support;Spiritual support;Social support

## 2016-11-10 ENCOUNTER — Inpatient Hospital Stay (HOSPITAL_COMMUNITY): Payer: PPO

## 2016-11-10 LAB — BASIC METABOLIC PANEL
Anion gap: 6 (ref 5–15)
BUN: 20 mg/dL (ref 6–20)
CO2: 26 mmol/L (ref 22–32)
Calcium: 8.5 mg/dL — ABNORMAL LOW (ref 8.9–10.3)
Chloride: 102 mmol/L (ref 101–111)
Creatinine, Ser: 1.13 mg/dL (ref 0.61–1.24)
GFR calc Af Amer: >60 mL/min (ref >60)
GFR calc non Af Amer: >60 mL/min (ref >60)
Glucose, Bld: 114 mg/dL — ABNORMAL HIGH (ref 65–99)
Potassium: 3.9 mmol/L (ref 3.5–5.1)
Sodium: 134 mmol/L — ABNORMAL LOW (ref 135–145)

## 2016-11-10 LAB — CBC
HCT: 29.4 % — ABNORMAL LOW (ref 39.0–52.0)
Hemoglobin: 9.7 g/dL — ABNORMAL LOW (ref 13.0–17.0)
MCH: 29.3 pg (ref 26.0–34.0)
MCHC: 33 g/dL (ref 30.0–36.0)
MCV: 88.8 fL (ref 78.0–100.0)
Platelets: 133 10*3/uL — ABNORMAL LOW (ref 150–400)
RBC: 3.31 MIL/uL — ABNORMAL LOW (ref 4.22–5.81)
RDW: 14.6 % (ref 11.5–15.5)
WBC: 6 10*3/uL (ref 4.0–10.5)

## 2016-11-10 LAB — GLUCOSE, CAPILLARY
Glucose-Capillary: 87 mg/dL (ref 65–99)
Glucose-Capillary: 165 mg/dL — ABNORMAL HIGH (ref 65–99)
Glucose-Capillary: 144 mg/dL — ABNORMAL HIGH (ref 65–99)

## 2016-11-10 MED ORDER — CANAGLIFLOZIN 300 MG PO TABS
300.0000 mg | ORAL_TABLET | Freq: Every day | ORAL | Status: DC
  Administered 2016-11-10 – 2016-11-12 (×3): 300 mg via ORAL
  Filled 2016-11-10 (×3): qty 1

## 2016-11-10 NOTE — Discharge Instructions (Signed)

## 2016-11-10 NOTE — Progress Notes (Signed)
CARDIAC REHAB PHASE I   PRE:  Rate/Rhythm: 84  SR c/ PACs  BP:  Sitting: 108/60        SaO2: 96 RA  MODE:  Ambulation: 1090 ft   POST:  Rate/Rhythm: 86 SR  BP:  Sitting: 144/72         SaO2: 97 RA  Pt ambulated 1090 ft on RA, rolling walker, independent, steady gait, tolerated well with no complaints. Pt to bed per pt request after walk, call bell within reach. Will follow.   [place], RN, BSN 11/10/2016 3:28 PM

## 2016-11-10 NOTE — Discharge Summary (Addendum)
Physician Discharge Summary  Patient ID: Hunter Mcbride. MRN: [id number] DOB/AGE: 70-May-1947 70 y.o.  Admit date: 11/07/2016 Discharge date: 11/12/2016  Admission Diagnoses: Patient Active Problem List   Diagnosis Date Noted  . Coronary artery disease 11/07/2016    Discharge Diagnoses:  Active Problems:   Coronary artery disease   Discharged Condition: good  HPI:  Patient admitted with unstable angina pain two days ago recently admitted and under went repeat cath. . He has Buffin history of recurrent chest pian and previous stent and angioplasties.  In the  spring of 2015 with  history of exertional chest pain he - had abnormal Myoview -At  [facility]  DES x 3 to the LAD was done . Otherwise had moderate but nonobstructive disease With a normal EF of 55 to 60%.  In November of 2015 - while  [place] of state.  He developed SSCP that initially resolved but then returned, subsequently associated with SOB and nausea - ended up going to the hospital in [place] , repeat cath done x2 . Had normal left heart pressures, patent stents extending from the proximal to the distal LAD and severe diffuse disease involving the branches of all 3 major epicardial vessels. Was recathed and had FFR of the first OM, posterior descending and distal RCA which were nonsignificant - no PCI was performed. CXR was normal. Echo was also updated - normal EF - mildly dilated aorta noted with mild LVH.   January of 2017 with angina - low risk Myoview noted. Continued on medical management. He has continued to have someepisodes of chest pain.  June of 2017 - he was not doing well - having more angina - ended up getting him recathed. Medical management continued.    Had recurrent CP and underwent repeat cath 06/07/2016 showing triple vessel CAD with severe stenosis of the PL and severe stenosis of a small to moderate PDA and underwent PTA of the PDA and PCI with DES to the PL. The mid LAD stent was patent.   He  has continued to have upper back burning that he calls his angina along with anterior CP.  Dr. Burt Knack noted  continuing to have chronic atypical CP that he states had been very difficult to manage and has been seen multiple times for CP.   Last week   he came  to the ER with continued complaints of chest pain and burning in his back. He took 3 SL NTG with some relief for a few minutes but then reocurred. His sx were associated withfatigue. Day of admission  he was working out in the yard he had recurrent cp  took a total of 5 SL NTG. Came to ER and was admitted .  Since the patient was seen in the hospital several days ago he's had no further angina but has significantly cut back his physical activity.  Hospital Course:  On 11/07/2016 Hunter Mcbride underwent a coronary bypass grafting 3 by Dr. Servando Snare. He tolerated the procedure well and was transferred to the ICU for continued care. He was extubated and timely manner. Postop day 1 we began to mobilize the patient. We initiated diuretic regimen for fluid overload. We discontinued chest tubes and lines. There was some expected acute blood loss anemia which we trended. Postop day 2 he continued to progress. Creatinine increased to 1.3 however the patient was making good urine. We maintained good blood glucose level control. We continued to mobilize the patient and he was walking the unit  without issue. The patient was stable at this time to transfer to the telemetry unit for continued care. Postop day 3 the patient remains in the ICU due to lack of beds. We resumed his preop diabetes medications. He continued to be in normal sinus rhythm but did have occasional PACs. We did start him on a beta blocker. He continued to make good progress.  He maintained NSR and his pacing wires were removed.  He is ambulating independently.  He is tolerating a diet.  He is felt medically stable for discharge home today.  Consults: None  Significant Diagnostic Studies:   CLINICAL DATA:  Status post CABG three days ago.  EXAM: CHEST  2 VIEW  COMPARISON:  Portable chest x-ray of November 09, 2016  FINDINGS: The lungs are reasonably well inflated. There is persistent density in the retrocardiac region and partially obscuring the left hemidiaphragm. The cardiac silhouette is mildly enlarged. The pulmonary vascularity is normal. There is no pneumothorax. Tiny bilateral pleural effusions are present. The sternal wires are intact.  IMPRESSION: Interval improvement in left lower lobe atelectasis but persistent abnormality remains. Tiny bilateral pleural effusions. No pneumothorax since chest tube removal. No pulmonary edema.   Electronically Signed   By: David  Martinique M.D.   On: 11/10/2016 07:49   Treatments:  NAME:  FAWAZ, BORQUEZ                 ACCOUNT NO.:  [id number]  MEDICAL RECORD NO.:  [id number]  LOCATION:  [facility]                         FACILITY:  [facility]  PHYSICIAN:  Lanelle Bal, MD    DATE OF BIRTH:  08-13-1945  DATE OF PROCEDURE:  11/07/2016 DATE OF DISCHARGE:                              OPERATIVE REPORT   PREOPERATIVE DIAGNOSIS:  Three-vessel coronary occlusive disease with angina.  POSTOPERATIVE DIAGNOSIS:  Three-vessel coronary occlusive disease with angina.  SURGICAL PROCEDURE:  Coronary artery bypass grafting x5 with the left internal mammary to the distal left anterior descending coronary artery, reverse saphenous vein graft to the diagonal, reverse saphenous vein graft to the first obtuse marginal, sequential reverse saphenous vein graft to the proximal posterior lateral branch and to the distal posterior descending with right leg, thigh, and calf greater saphenous vein harvesting endoscopically.  SURGEON:  Lanelle Bal, M.D.  FIRST ASSISTANT:  Lars Pinks, PA.  Discharge Exam: Blood pressure 110/70, pulse 88, temperature 99.3 F (37.4 C), temperature source Oral, resp. rate 19, height  5\' 9"  (1.753 m), weight 197 lb (89.4 kg), SpO2 100 %.  General appearance: alert, cooperative and no distress Heart: regular rate and rhythm Lungs: clear to auscultation bilaterally Abdomen: benign Extremities: + LE edema Wound: incis healing well   Disposition: 01-Home or Self Care  Discharge Instructions    Amb Referral to Cardiac Rehabilitation    Complete by:  As directed    Diagnosis:  CABG   CABG X ___:  5     Allergies as of 11/12/2016   No Known Allergies     Medication List    STOP taking these medications   amLODipine 5 MG tablet Commonly known as:  NORVASC   isosorbide mononitrate 30 MG 24 hr tablet Commonly known as:  IMDUR   nitroGLYCERIN 0.4 MG SL tablet Commonly known as:  NITROSTAT   ranolazine 500 MG 12 hr tablet Commonly known as:  RANEXA   traMADol 50 MG tablet Commonly known as:  ULTRAM     TAKE these medications   acetaminophen 500 MG tablet Commonly known as:  TYLENOL Take 1,000 mg by mouth every 8 (eight) hours as needed (pain).   ALPRAZolam 0.5 MG tablet Commonly known as:  XANAX Take 0.5 tablets (0.25 mg total) by mouth daily as needed for anxiety. What changed:  how much to take Notes to patient:  Last dose 11/10/2016 @ 8:30pm   aspirin 81 MG tablet Take 81 mg by mouth at bedtime.   atenolol 50 MG tablet Commonly known as:  TENORMIN Take 1 tablet (50 mg total) by mouth at bedtime.   atorvastatin 80 MG tablet Commonly known as:  LIPITOR Take 80 mg by mouth daily at 6 PM.   clopidogrel 75 MG tablet Commonly known as:  PLAVIX Take 1 tablet (75 mg total) by mouth daily with breakfast.   docusate sodium 100 MG capsule Commonly known as:  COLACE Take 100 mg by mouth daily as needed for mild constipation.   furosemide 40 MG tablet Commonly known as:  LASIX Take 1 tablet (40 mg total) by mouth daily.   gabapentin 300 MG capsule Commonly known as:  NEURONTIN Take 300 mg by mouth at bedtime.   INVOKANA 300 MG Tabs  tablet Generic drug:  canagliflozin Take 300 mg by mouth daily before breakfast.   metformin 1000 MG (OSM) 24 hr tablet Commonly known as:  FORTAMET Take 1 tablet (1,000 mg total) by mouth 2 (two) times daily with a meal. What changed:  medication strength  when to take this   oxyCODONE 5 MG immediate release tablet Commonly known as:  Oxy IR/ROXICODONE Take 1-2 tablets (5-10 mg total) by mouth every 6 (six) hours as needed for severe pain.   pantoprazole 40 MG tablet Commonly known as:  PROTONIX Take 40 mg by mouth 2 (two) times daily.   polyethylene glycol packet Commonly known as:  MIRALAX / GLYCOLAX Take 17 g by mouth daily as needed for mild constipation.   potassium chloride SA 20 MEQ tablet Commonly known as:  K-DUR,KLOR-CON Take 1 tablet (20 mEq total) by mouth daily.   VITAMIN D PO Take 1 capsule by mouth daily.            Discharge Care Instructions        Start     Ordered   11/11/16 0000  Amb Referral to Cardiac Rehabilitation    Question Answer Comment  Diagnosis: CABG   CABG X ___ 5      11/11/16 1205   11/11/16 0000  ALPRAZolam (XANAX) 0.5 MG tablet  Daily PRN    Question:  Supervising Provider  Answer:  [place] Gum, PETER   11/11/16 1232   11/11/16 0000  furosemide (LASIX) 40 MG tablet  Daily    Question:  Supervising Provider  Answer:  Lanelle Bal B   11/11/16 1232   11/11/16 0000  metFORMIN (FORTAMET) 1000 MG (OSM) 24 hr tablet  2 times daily with meals    Question:  Supervising Provider  Answer:  Lanelle Bal B   11/11/16 1232   11/11/16 0000  oxyCODONE (OXY IR/ROXICODONE) 5 MG immediate release tablet  Every 6 hours PRN    Question:  Supervising Provider  Answer:  Lanelle Bal B   11/11/16 1232   11/11/16 0000  potassium chloride SA (K-DUR,KLOR-CON) 20 MEQ tablet  Daily    Question:  Supervising Provider  Answer:  Grace Isaac   11/11/16 1232     Follow-up Information    Crist Infante, MD. Call in 1 day(s).    Specialty:  Internal Medicine Contact information: [address] [place] [address] [phone]        Grace Isaac, MD Follow up.   Specialty:  Cardiothoracic Surgery Why:  Your appointment is on 12/08/2016 at 9:00am. Please report for a chest xray at 8:30am at [facility] which is located on the first floor of our building.  Contact information: [address] [place] [address] [phone]          The patient has been discharged on:   1.Beta Blocker:  Yes [  x ]                              No   [   ]                              If No, reason:  2.Ace Inhibitor/ARB: Yes [   ]                                     No  [  x  ]                                     If No, reason:AKI  3.Statin:   Yes [ x  ]                  No  [   ]                  If No, reason:  4.Ecasa:  Yes  [ x  ]                  No   [   ]                  If No, reason:   Signed: BARRETT, ERIN 11/12/2016, 8:40 AM

## 2016-11-10 NOTE — Plan of Care (Signed)
Problem: Pain Managment: Goal: General experience of comfort will improve Outcome: Progressing Pt c/o some moderate back pain ever since surgery, controlled w/ PRNs and repositioning. Pt calls for medications when necessary

## 2016-11-10 NOTE — Progress Notes (Addendum)
Patient ID: Hunter Stakes., male   DOB: 01-09-1946, 71 y.o.   MRN: [id number] [facility] DAILY ICU PROGRESS NOTE                   [address]            [place] [address]          [phone]   3 Days Post-Op Procedure(s) (LRB): CORONARY ARTERY BYPASS GRAFTING (CABG) x 5 (LIMA to DISTAL LAD, SVG to DIAGONAL, SVG to CIRCUMFLEX, and SVG SEQUENTIALLY to PLB and DISTAL PDA) with EVH of the RIGHT GREATER SAPHENOUS VEIN and LEFT INTERNAL MAMMARY ARTERY HARVEST (N/A) TRANSESOPHAGEAL ECHOCARDIOGRAM (TEE) (N/A)  Total Length of Stay:  LOS: 3 days   Subjective: Feels ok this am, walked this am  Objective: Vital signs in last 24 hours: Temp:  [97.6 F (36.4 C)-99.6 F (37.6 C)] 98.4 F (36.9 C) (09/27 0400) Pulse Rate:  [72-97] 77 (09/27 0400) Cardiac Rhythm: Normal sinus rhythm (09/27 0400) Resp:  [16-27] 19 (09/27 0400) BP: (101-126)/(51-91) 110/64 (09/27 0400) SpO2:  [90 %-98 %] 95 % (09/27 0400) Weight:  [198 lb 8 oz (90 kg)] 198 lb 8 oz (90 kg) (09/27 0500)  Filed Weights   11/08/16 0400 11/09/16 0400 11/10/16 0500  Weight: 197 lb 1.5 oz (89.4 kg) 197 lb 8.5 oz (89.6 kg) 198 lb 8 oz (90 kg)    Weight change: 15.5 oz (0.439 kg)   Hemodynamic parameters for last 24 hours:    Intake/Output from previous day: 09/26 0701 - 09/27 0700 In: 800 [P.O.:720; I.V.:80] Out: 310 [Urine:310]  Intake/Output this shift: No intake/output data recorded.  Current Meds: Scheduled Meds: . aspirin EC  81 mg Oral QHS  . atenolol  50 mg Oral QHS  . atorvastatin  80 mg Oral q1800  . cholecalciferol  400 Units Oral Daily  . clopidogrel  75 mg Oral Q breakfast  . enoxaparin (LOVENOX) injection  30 mg Subcutaneous QHS  . gabapentin  300 mg Oral QHS  . insulin aspart  0-24 Units Subcutaneous TID PC & HS  . insulin detemir  15 Units Subcutaneous Daily  . mouth rinse  15 mL Mouth Rinse BID  . metFORMIN  1,000 mg Oral BID WC  . pantoprazole  40 mg Oral QAC breakfast  . sodium  chloride flush  3 mL Intravenous Q12H   Continuous Infusions: . sodium chloride     PRN Meds:.sodium chloride, acetaminophen, ALPRAZolam, alum & mag hydroxide-simeth, bisacodyl **OR** bisacodyl, ondansetron **OR** ondansetron (ZOFRAN) IV, oxyCODONE, polyethylene glycol, sodium chloride flush, traMADol  General appearance: alert, cooperative and no distress Neurologic: intact Heart: regularly irregular rhythm Lungs: diminished breath sounds bibasilar Abdomen: soft, non-tender; bowel sounds normal; no masses,  no organomegaly Extremities: extremities normal, atraumatic, no cyanosis or edema and Homans sign is negative, no sign of DVT Wound: sternum intact  Lab Results: CBC: Recent Labs  11/09/16 0324 11/10/16 0450  WBC 5.2 6.0  HGB 9.2* 9.7*  HCT 28.0* 29.4*  PLT 88* 133*   BMET:  Recent Labs  11/09/16 0324 11/10/16 0450  NA 133* 134*  K 4.1 3.9  CL 103 102  CO2 25 26  GLUCOSE 110* 114*  BUN 19 20  CREATININE 1.30* 1.13  CALCIUM 7.9* 8.5*    CMET: Lab Results  Component Value Date   WBC 6.0 11/10/2016   HGB 9.7 (L) 11/10/2016   HCT 29.4 (L) 11/10/2016   PLT 133 (L) 11/10/2016  GLUCOSE 114 (H) 11/10/2016   CHOL 84 10/28/2016   TRIG 31 10/28/2016   HDL 36 (L) 10/28/2016   LDLCALC 42 10/28/2016   ALT 44 11/03/2016   AST 27 11/03/2016   NA 134 (L) 11/10/2016   K 3.9 11/10/2016   CL 102 11/10/2016   CREATININE 1.13 11/10/2016   BUN 20 11/10/2016   CO2 26 11/10/2016   TSH 2.009 06/06/2016   INR 1.51 11/07/2016   HGBA1C 7.5 (H) 11/03/2016      PT/INR:  Recent Labs  11/07/16 1448  LABPROT 18.1*  INR 1.51   Radiology: Dg Chest 2 View  Result Date: 11/10/2016 CLINICAL DATA:  Status post CABG three days ago. EXAM: CHEST  2 VIEW COMPARISON:  Portable chest x-ray of November 09, 2016 FINDINGS: The lungs are reasonably well inflated. There is persistent density in the retrocardiac region and partially obscuring the left hemidiaphragm. The cardiac  silhouette is mildly enlarged. The pulmonary vascularity is normal. There is no pneumothorax. Tiny bilateral pleural effusions are present. The sternal wires are intact. IMPRESSION: Interval improvement in left lower lobe atelectasis but persistent abnormality remains. Tiny bilateral pleural effusions. No pneumothorax since chest tube removal. No pulmonary edema. Electronically Signed   By: David  Martinique M.D.   On: 11/10/2016 07:49     Assessment/Plan: S/P Procedure(s) (LRB): CORONARY ARTERY BYPASS GRAFTING (CABG) x 5 (LIMA to DISTAL LAD, SVG to DIAGONAL, SVG to CIRCUMFLEX, and SVG SEQUENTIALLY to PLB and DISTAL PDA) with EVH of the RIGHT GREATER SAPHENOUS VEIN and LEFT INTERNAL MAMMARY ARTERY HARVEST (N/A) TRANSESOPHAGEAL ECHOCARDIOGRAM (TEE) (N/A) Mobilize Diabetes control plt count increasing  Pac's this am on betablocker no afib,  continue to monitor  Poss home sat if rhythm stable  Resume preop dm meds     Grace Isaac 11/10/2016 8:07 AM

## 2016-11-11 LAB — GLUCOSE, CAPILLARY
Glucose-Capillary: 91 mg/dL (ref 65–99)
Glucose-Capillary: 135 mg/dL — ABNORMAL HIGH (ref 65–99)
Glucose-Capillary: 204 mg/dL — ABNORMAL HIGH (ref 65–99)

## 2016-11-11 MED ORDER — FUROSEMIDE 40 MG PO TABS: 40.0000 mg | ORAL_TABLET | Freq: Every day | ORAL | 0 refills | Status: DC

## 2016-11-11 MED ORDER — FUROSEMIDE 40 MG PO TABS
40.0000 mg | ORAL_TABLET | Freq: Every day | ORAL | Status: DC
  Administered 2016-11-11: 40 mg via ORAL
  Filled 2016-11-11: qty 1

## 2016-11-11 MED ORDER — ALPRAZOLAM 0.5 MG PO TABS: 0.2500 mg | ORAL_TABLET | Freq: Every day | ORAL | 0 refills | Status: DC | PRN

## 2016-11-11 MED ORDER — POTASSIUM CHLORIDE CRYS ER 20 MEQ PO TBCR
20.0000 meq | EXTENDED_RELEASE_TABLET | Freq: Every day | ORAL | Status: DC
  Administered 2016-11-11 – 2016-11-12 (×2): 20 meq via ORAL
  Filled 2016-11-11 (×3): qty 1

## 2016-11-11 MED ORDER — METFORMIN HCL ER (OSM) 1000 MG PO TB24: 1000.0000 mg | ORAL_TABLET | Freq: Two times a day (BID) | ORAL | 1 refills | Status: DC

## 2016-11-11 MED ORDER — OXYCODONE HCL 5 MG PO TABS: 5.0000 mg | ORAL_TABLET | Freq: Four times a day (QID) | ORAL | 0 refills | Status: DC | PRN

## 2016-11-11 MED ORDER — POTASSIUM CHLORIDE CRYS ER 20 MEQ PO TBCR: 20.0000 meq | EXTENDED_RELEASE_TABLET | Freq: Every day | ORAL | 0 refills | Status: DC

## 2016-11-11 NOTE — Consult Note (Signed)
   [facility] CM Inpatient Consult   11/11/2016  Hunter Mcbride. 1945-06-20 [id number]   Patient was assessed for re-admission.  Met with the patient regarding the benefits of [facility] services  .Explained that [facility] is a covered benefit of his [employer] insurance. Review information for [facility] Care Management and a brochure with 24 hour magnet was provided with contact information.  Explained that [facility] does not interfere with or replace any services arranged by the inpatient care management staff. Wife endorses Dr. Crist Infante as his primary care provider, [facility].  She states,"I don't think we have any needs right now."   This practice is listed to provide the transition of care follow up and calls.   Patient declined any services needed with [facility] Care Management.  For questions, please contact:  Natividad Brood, RN BSN [facility] Liaison  [phone] business mobile phone Toll free office [phone]

## 2016-11-11 NOTE — Progress Notes (Signed)
Pt had expressed desire for discharge this morning, orders received.  However after 4hr observation period s/p pacing wire removal, pt stated he felt feverish, his back was aching and he was wheezy.  He said he would be more comfortable if he stayed another night.  [name], PA notified.  Discharge orders cancelled.

## 2016-11-11 NOTE — Progress Notes (Signed)
CARDIAC REHAB PHASE I   Pt on bedrest post EPW removal, has ambulated with family, no complaints. Cardiac surgery discharge education completed with pt, pt's wife and daughter at bedside. Reviewed IS, sternal precautions, activity progression, exercise, heart healthy and diabetes diet handouts, daily weights and phase 2 cardiac rehab. Pt and verbalized understanding, receptive to education. Pt agrees to phase 2 cardiac rehab referral, will send to [facility] per pt request. Pt in bed, call bell within reach.   [phone] Lenna Sciara, RN, BSN 11/11/2016 12:06 PM

## 2016-11-11 NOTE — Care Management Important Message (Signed)
Important Message  Patient Details  Name: Hunter Mcbride. MRN: [id number] Date of Birth: 03-05-1945   Medicare Important Message Given:  Yes    Nathen May 11/11/2016, 2:40 PM

## 2016-11-11 NOTE — Progress Notes (Signed)
[place] [address]       [place] [address]             [phone]      4 Days Post-Op Procedure(s) (LRB): CORONARY ARTERY BYPASS GRAFTING (CABG) x 5 (LIMA to DISTAL LAD, SVG to DIAGONAL, SVG to CIRCUMFLEX, and SVG SEQUENTIALLY to PLB and DISTAL PDA) with EVH of the RIGHT GREATER SAPHENOUS VEIN and LEFT INTERNAL MAMMARY ARTERY HARVEST (N/A) TRANSESOPHAGEAL ECHOCARDIOGRAM (TEE) (N/A) Subjective: Feels pretty well  Objective: Vital signs in last 24 hours: Temp:  [98.9 F (37.2 C)-99.4 F (37.4 C)] 99.3 F (37.4 C) (09/28 0450) Pulse Rate:  [44-74] 55 (09/28 0450) Cardiac Rhythm: Normal sinus rhythm (09/27 1900) Resp:  [17-23] 23 (09/28 0450) BP: (99-105)/(55-65) 105/55 (09/28 0450) SpO2:  [92 %-100 %] 100 % (09/28 0450) Weight:  [197 lb (89.4 kg)] 197 lb (89.4 kg) (09/28 0450)  Hemodynamic parameters for last 24 hours:    Intake/Output from previous day: 09/27 0701 - 09/28 0700 In: 600 [P.O.:600] Out: -  Intake/Output this shift: No intake/output data recorded.  General appearance: alert, cooperative and no distress Heart: regular rate and rhythm Lungs: clear to auscultation bilaterally Abdomen: benign Extremities: + LE edema Wound: incis healing well  Lab Results:  Recent Labs  11/09/16 0324 11/10/16 0450  WBC 5.2 6.0  HGB 9.2* 9.7*  HCT 28.0* 29.4*  PLT 88* 133*   BMET:  Recent Labs  11/09/16 0324 11/10/16 0450  NA 133* 134*  K 4.1 3.9  CL 103 102  CO2 25 26  GLUCOSE 110* 114*  BUN 19 20  CREATININE 1.30* 1.13  CALCIUM 7.9* 8.5*    PT/INR: No results for input(s): LABPROT, INR in the last 72 hours. ABG    Component Value Date/Time   PHART 7.290 (L) 11/07/2016 1912   HCO3 22.5 11/07/2016 1912   TCO2 25 11/08/2016 1709   ACIDBASEDEF 4.0 (H) 11/07/2016 1912   O2SAT 96.0 11/07/2016 1912   CBG (last 3)   Recent Labs  11/10/16 1208 11/10/16 1623 11/11/16 0643  GLUCAP 165* 144* 91    Meds Scheduled Meds: . aspirin  EC  81 mg Oral QHS  . atenolol  50 mg Oral QHS  . atorvastatin  80 mg Oral q1800  . canagliflozin  300 mg Oral QAC breakfast  . cholecalciferol  400 Units Oral Daily  . clopidogrel  75 mg Oral Q breakfast  . enoxaparin (LOVENOX) injection  30 mg Subcutaneous QHS  . gabapentin  300 mg Oral QHS  . insulin aspart  0-24 Units Subcutaneous TID PC & HS  . mouth rinse  15 mL Mouth Rinse BID  . metFORMIN  1,000 mg Oral BID WC  . pantoprazole  40 mg Oral QAC breakfast  . sodium chloride flush  3 mL Intravenous Q12H   Continuous Infusions: . sodium chloride     PRN Meds:.sodium chloride, acetaminophen, ALPRAZolam, alum & mag hydroxide-simeth, bisacodyl **OR** bisacodyl, ondansetron **OR** ondansetron (ZOFRAN) IV, oxyCODONE, polyethylene glycol, sodium chloride flush, traMADol  Xrays Dg Chest 2 View  Result Date: 11/10/2016 CLINICAL DATA:  Status post CABG three days ago. EXAM: CHEST  2 VIEW COMPARISON:  Portable chest x-ray of November 09, 2016 FINDINGS: The lungs are reasonably well inflated. There is persistent density in the retrocardiac region and partially obscuring the left hemidiaphragm. The cardiac silhouette is mildly enlarged. The pulmonary vascularity is normal. There is no pneumothorax. Tiny bilateral pleural effusions are present. The sternal wires  are intact. IMPRESSION: Interval improvement in left lower lobe atelectasis but persistent abnormality remains. Tiny bilateral pleural effusions. No pneumothorax since chest tube removal. No pulmonary edema. Electronically Signed   By: David  Martinique M.D.   On: 11/10/2016 07:49    Assessment/Plan: S/P Procedure(s) (LRB): CORONARY ARTERY BYPASS GRAFTING (CABG) x 5 (LIMA to DISTAL LAD, SVG to DIAGONAL, SVG to CIRCUMFLEX, and SVG SEQUENTIALLY to PLB and DISTAL PDA) with EVH of the RIGHT GREATER SAPHENOUS VEIN and LEFT INTERNAL MAMMARY ARTERY HARVEST (N/A) TRANSESOPHAGEAL ECHOCARDIOGRAM (TEE) (N/A)  1 doing well 2 d/c wires 3 sinus with some  pvc's, BP too low for ACE/ARB 4 diabetes management discussed 5 poss home later today or tomorrow 6 no new labs  LOS: 4 days    Hunter Mcbride,Hunter Mcbride 11/11/2016

## 2016-11-11 NOTE — Progress Notes (Signed)
Pacing wires and chest tube sutures removed as per orders.  Pt tolerated well, bedrest until 1130.

## 2016-11-12 LAB — GLUCOSE, CAPILLARY: Glucose-Capillary: 132 mg/dL — ABNORMAL HIGH (ref 65–99)

## 2016-11-12 MED ORDER — INFLUENZA VAC SPLIT HIGH-DOSE 0.5 ML IM SUSY
0.5000 mL | PREFILLED_SYRINGE | Freq: Once | INTRAMUSCULAR | Status: AC
Start: 2016-11-12 — End: 2016-11-12
  Administered 2016-11-12: 0.5 mL via INTRAMUSCULAR
  Filled 2016-11-12: qty 0.5

## 2016-11-12 MED ORDER — FUROSEMIDE 10 MG/ML IJ SOLN
40.0000 mg | Freq: Once | INTRAMUSCULAR | Status: AC
  Administered 2016-11-12: 40 mg via INTRAVENOUS
  Filled 2016-11-12: qty 4

## 2016-11-12 NOTE — Progress Notes (Signed)
      [address]       [place] [address]             [phone]      5 Days Post-Op Procedure(s) (LRB): CORONARY ARTERY BYPASS GRAFTING (CABG) x 5 (LIMA to DISTAL LAD, SVG to DIAGONAL, SVG to CIRCUMFLEX, and SVG SEQUENTIALLY to PLB and DISTAL PDA) with EVH of the RIGHT GREATER SAPHENOUS VEIN and LEFT INTERNAL MAMMARY ARTERY HARVEST (N/A) TRANSESOPHAGEAL ECHOCARDIOGRAM (TEE) (N/A)   Subjective:  Hunter Mcbride is feeling much better this morning.  Notes after pacing wire removal noted.  + ambulation  + BM  Objective: Vital signs in last 24 hours: Temp:  [99 F (37.2 C)-99.6 F (37.6 C)] 99.3 F (37.4 C) (09/28 2353) Pulse Rate:  [73-88] 88 (09/28 1500) Cardiac Rhythm: Normal sinus rhythm (09/29 0800) Resp:  [16-19] 19 (09/28 2353) BP: (99-124)/(63-74) 110/70 (09/28 2353) SpO2:  [93 %-100 %] 100 % (09/28 1500)  Intake/Output from previous day: 09/28 0701 - 09/29 0700 In: 480 [P.O.:480] Out: 450 [Urine:450]  General appearance: alert, cooperative and no distress Heart: regular rate and rhythm Lungs: clear to auscultation bilaterally Abdomen: soft, non-tender; bowel sounds normal; no masses,  no organomegaly Extremities: edema trace Wound: clean and dry  Lab Results:  Recent Labs  11/10/16 0450  WBC 6.0  HGB 9.7*  HCT 29.4*  PLT 133*   BMET:  Recent Labs  11/10/16 0450  NA 134*  K 3.9  CL 102  CO2 26  GLUCOSE 114*  BUN 20  CREATININE 1.13  CALCIUM 8.5*    PT/INR: No results for input(s): LABPROT, INR in the last 72 hours. ABG    Component Value Date/Time   PHART 7.290 (L) 11/07/2016 1912   HCO3 22.5 11/07/2016 1912   TCO2 25 11/08/2016 1709   ACIDBASEDEF 4.0 (H) 11/07/2016 1912   O2SAT 96.0 11/07/2016 1912   CBG (last 3)   Recent Labs  11/11/16 1631 11/11/16 2057 11/12/16 0744  GLUCAP 135* 204* 132*    Assessment/Plan: S/P Procedure(s) (LRB): CORONARY ARTERY BYPASS GRAFTING (CABG) x 5 (LIMA to DISTAL LAD, SVG to DIAGONAL,  SVG to CIRCUMFLEX, and SVG SEQUENTIALLY to PLB and DISTAL PDA) with EVH of the RIGHT GREATER SAPHENOUS VEIN and LEFT INTERNAL MAMMARY ARTERY HARVEST (N/A) TRANSESOPHAGEAL ECHOCARDIOGRAM (TEE) (N/A)   1. CV- NSR- continue Lopressor 2. Pulm- no acute issues, continue IS 3. Renal- creatinine improved, weight is 10 lbs above baseline with pitting edema, will give IV Lasix prior to discharge, continue oral regimen tomorrow 4. DM- sugars controlled continue current regimen 5. Dispo- patient stable, will d/c home today   LOS: 5 days    Ahmed Prima, Junie Panning 11/12/2016

## 2016-11-12 NOTE — Care Management (Signed)
Case Management Note Initial Note Started By Tomi Bamberger, RN,BSN 11-09-16   Patient Details  Name: Hunter Mcbride. MRN: [id number] Date of Birth: 04-26-1945  Subjective/Objective:  From home with spouse, who will be with patient at discharge every day for assistance if needed. POD 2 CABG, ambulated around unit. Cont to diuresis, dc chest tubes, will transfer to SDU.                    Action/Plan: NCM will follow for dc needs.   Expected Discharge Date:                         Expected Discharge Plan:  Home/Self Care  In-House Referral: N/A  Discharge planning Services  CM Consult  Post Acute Care Choice: N/A  Choice offered to: N/A  DME Arranged:N/A    DME Agency:  N/A  HH Arranged: N/A   HH Agency:  N/A  Status of Service: COMPLETED  If discussed at [place] Length of Stay Meetings, dates discussed:    Additional Comments: [phone] 11-12-16 Jacqlyn Krauss, RN,BSN [phone] S/p CABG- Pt ambulating in the halls with wife at the time of visit. No home needs identified. No further needs from CM at this time.

## 2016-11-12 NOTE — Progress Notes (Signed)
Pt. Discharged to home with family Pt. D/C'd via wheelchair with NT Discharge information reviewed and given All personal belongings given to Pt.  Education discussed with teach back  IV was d/c Tele d/c  Rx printed and given

## 2016-11-12 NOTE — Progress Notes (Signed)
Patient ambulating in hallway with spouse. Hunter Mcbride, Bettina Gavia RN

## 2016-11-14 ENCOUNTER — Encounter (HOSPITAL_COMMUNITY): Payer: Self-pay | Admitting: Cardiothoracic Surgery

## 2016-11-14 ENCOUNTER — Telehealth (HOSPITAL_COMMUNITY): Payer: Self-pay

## 2016-11-14 MED ORDER — THROMBIN 20000 UNITS EX SOLR
CUTANEOUS | Status: DC | PRN
  Administered 2016-11-14: 20000 [IU] via TOPICAL

## 2016-11-14 NOTE — Telephone Encounter (Signed)
Patient insurance is active and benefits verified. Patient insurance is [employer] - $15.00 co-payment, no deductible, out of pocket $3400/$800 has been met, no co-insurance and no pre-authorization. Spoke with Caren Griffins @ [employer] - on 11/14/16 - reference [id number].  Patient will be contacted and scheduled after their follow up appointment with the cardiologist on 11/28/16 and surgeon on 12/08/16, upon review by [facility] RN navigator.

## 2016-11-28 ENCOUNTER — Ambulatory Visit (INDEPENDENT_AMBULATORY_CARE_PROVIDER_SITE_OTHER): Payer: PPO | Admitting: Nurse Practitioner

## 2016-11-28 ENCOUNTER — Encounter: Payer: Self-pay | Admitting: Nurse Practitioner

## 2016-11-28 VITALS — BP 148/80 | HR 76 | Ht 69.0 in | Wt 188.4 lb

## 2016-11-28 DIAGNOSIS — I259 Chronic ischemic heart disease, unspecified: Secondary | ICD-10-CM | POA: Diagnosis not present

## 2016-11-28 DIAGNOSIS — Z951 Presence of aortocoronary bypass graft: Secondary | ICD-10-CM | POA: Diagnosis not present

## 2016-11-28 DIAGNOSIS — I10 Essential (primary) hypertension: Secondary | ICD-10-CM | POA: Diagnosis not present

## 2016-11-28 DIAGNOSIS — E7800 Pure hypercholesterolemia, unspecified: Secondary | ICD-10-CM | POA: Diagnosis not present

## 2016-11-28 DIAGNOSIS — I2510 Atherosclerotic heart disease of native coronary artery without angina pectoris: Secondary | ICD-10-CM

## 2016-11-28 LAB — CBC
Hematocrit: 31.7 % — ABNORMAL LOW (ref 37.5–51.0)
Hemoglobin: 10.5 g/dL — ABNORMAL LOW (ref 13.0–17.7)
MCH: 27.7 pg (ref 26.6–33.0)
MCHC: 33.1 g/dL (ref 31.5–35.7)
MCV: 84 fL (ref 79–97)
Platelets: 240 10*3/uL (ref 150–379)
RBC: 3.79 x10E6/uL — ABNORMAL LOW (ref 4.14–5.80)
RDW: 13.9 % (ref 12.3–15.4)
WBC: 4.1 10*3/uL (ref 3.4–10.8)

## 2016-11-28 LAB — BASIC METABOLIC PANEL
BUN/Creatinine Ratio: 16 (ref 10–24)
BUN: 13 mg/dL (ref 8–27)
CO2: 22 mmol/L (ref 20–29)
Calcium: 9.7 mg/dL (ref 8.6–10.2)
Chloride: 105 mmol/L (ref 96–106)
Creatinine, Ser: 0.82 mg/dL (ref 0.76–1.27)
GFR calc Af Amer: 104 mL/min/{1.73_m2} (ref >59)
GFR calc non Af Amer: 90 mL/min/{1.73_m2} (ref >59)
Glucose: 146 mg/dL — ABNORMAL HIGH (ref 65–99)
Potassium: 5.3 mmol/L — ABNORMAL HIGH (ref 3.5–5.2)
Sodium: 142 mmol/L (ref 134–144)

## 2016-11-28 NOTE — Patient Instructions (Addendum)
We will be checking the following labs today - BMET and CBC   Medication Instructions:    Continue with your current medicines.     Testing/Procedures To Be Arranged:  N/A  Follow-Up:   See me in a month  Other Special Instructions:   I have sent a message to cardiac rehab  Think about what we talked about today.     If you need a refill on your cardiac medications before your next appointment, please call your pharmacy.   Call the [place] office at [phone] if you have any questions, problems or concerns.

## 2016-11-28 NOTE — Progress Notes (Signed)
CARDIOLOGY OFFICE NOTE  Date:  11/28/2016    Hunter Mcbride. Date of Birth: [birth year] Medical Record #[id number]  PCP:  Crist Infante, MD  Cardiologist:  Jerel Shepherd    Chief Complaint  Patient presents with  . Coronary Artery Disease    Post CABG - seen for Dr. Nahser/Cooper    History of Present Illness: Hunter Mcbride. is a 71 y.o. male who presents today for a post hospital visit. Seen for Dr. Maryruth Eve.   He has a history of CAD, HTN, HLD, DM and OSA. He has had prior DES x 3 to the LAD back in 2015. Several caths since. Intolerant to higher doses of nitrates despite multiple attempts. Has not wanted to increase Ranexa due to cost. Has had chronic tendency to really overexert himself.  It is surprising how very well his exercise tolerance is and that his angina is typically always at rest - this is chronic. He has had prior echo showing mild dilatation of the aorta with repeat studies not identified.   He was cathed back in April of 2018 after having prolonged episodes of chest pain - had angioplasty at that time to a severe stenosis in the proximal PDA that was small to moderate caliber. Had DES x 1 to the proximal posterolateral artery. On return visit back in May still having some symptoms - discussed with Dr. Burt Knack and planned to have pressure wire analysis of all the coronaries.   He got admitted back at the end of September - again having angina - again after exertion - his typical presentation which has chronically seemed atypical for angina - referred on for CABG x 5 - this was done by EBG - uneventful post op course noted.   Comes in today. Here with his wife [name]. He is not doing as well as he expected to be. Good days and bad days noted. Fatigues easily. Still with lots of chest pain - using Tramadol with relief. But actually used a NTG yesterday. Had been out Saturday - most of the day - did too much - lots of anterior chest pain yesterday -  nothing like his prior chest pain syndrome - but did use NTG x 1 and says it helped. He is also taking pain medicine. He sweats intermittently. Appetite not great. Very anxious to go back to all of his lifting and prior "way of life". BP is fine at home.   Past Medical History:  Diagnosis Date  . Arthritis    "lower back; right shoulder; left thumb; joints" (06/21/2013)  . Asthma    "not sure if this is true or not" (06/21/2013)  . CAD (coronary artery disease) May 2015   s/p PTCA/DES x 3 to mid LAD, mod non-ob disease in Cx and RCA May 2015; s/p repeat caths - last study in November of 2015 - stents patent, - FFR - managed medically.   [name] GERD (gastroesophageal reflux disease)   . Gout    "maybe twice in my life"  . Headache(784.0)    "weekly for the last 3-4 months" (06/21/2013)  . Hyperlipidemia   . Hypertension   . Myocardial infarction [facility])    "/Dr. Katharina Caper I've had one between 2014-2015) (06/21/2013)  . Osteoarthritis   . Sleep apnea    WEIGHT LOSS, NO LONGER NEEDS PER PATIENT  . Type II diabetes mellitus ([place])    TYPE 2    Past Surgical History:  Procedure Laterality Date  .  CARDIAC CATHETERIZATION  1980's   "once"  . CARDIAC CATHETERIZATION N/A 07/23/2015   Procedure: Left Heart Cath and Coronary Angiography;  Surgeon: Sherren Mocha, MD;  Location: [place] [facility];  Service: Cardiovascular;  Laterality: N/A;  . CARDIOVASCULAR STRESS TEST  06/10/2008   EF 68%  . CARPAL TUNNEL RELEASE Bilateral   . CORONARY ANGIOPLASTY WITH STENT PLACEMENT  06/21/2013   "3"  . CORONARY ARTERY BYPASS GRAFT N/A 11/07/2016   Procedure: CORONARY ARTERY BYPASS GRAFTING (CABG) x 5 (LIMA to DISTAL LAD, SVG to DIAGONAL, SVG to CIRCUMFLEX, and SVG SEQUENTIALLY to PLB and DISTAL PDA) with EVH of the RIGHT GREATER SAPHENOUS VEIN and LEFT INTERNAL MAMMARY ARTERY HARVEST;  Surgeon: Grace Isaac, MD;  Location: [facility];  Service: Open Heart Surgery;  Laterality: N/A;  . CORONARY STENT INTERVENTION N/A  06/07/2016   Procedure: Coronary Stent Intervention;  Surgeon: Burnell Blanks, MD;  Location: [place] [facility];  Service: Cardiovascular;  Laterality: N/A;  . EXCISION MORTON'S NEUROMA Left   . EYE SURGERY Left    "removed film over my eye"  . FRACTIONAL FLOW RESERVE WIRE N/A 06/26/2013   Procedure: FRACTIONAL FLOW RESERVE WIRE;  Surgeon: [name], MD;  Location: [place] [facility]ATH [facility];  Service: Cardiovascular;  Laterality: N/A;  . HERNIA REPAIR Left   . INTRAVASCULAR PRESSURE WIRE/FFR STUDY N/A 10/28/2016   Procedure: INTRAVASCULAR PRESSURE WIRE/FFR STUDY;  Surgeon: Leonie Man, MD;  Location: [facility];  Service: Cardiovascular;  Laterality: N/A;  . JOINT REPLACEMENT Left 03/2013   "thumb"  . LEFT HEART CATH AND CORONARY ANGIOGRAPHY N/A 06/07/2016   Procedure: Left Heart Cath and Coronary Angiography;  Surgeon: Burnell Blanks, MD;  Location: [facility];  Service: Cardiovascular;  Laterality: N/A;  . LEFT HEART CATH AND CORONARY ANGIOGRAPHY N/A 10/28/2016   Procedure: LEFT HEART CATH AND CORONARY ANGIOGRAPHY;  Surgeon: Leonie Man, MD;  Location: [place] [facility];  Service: Cardiovascular;  Laterality: N/A;  . LEFT HEART CATHETERIZATION WITH CORONARY ANGIOGRAM N/A 06/21/2013   Procedure: LEFT HEART CATHETERIZATION WITH CORONARY ANGIOGRAM;  Surgeon: Burnell Blanks, MD;  Location: [facility];  Service: Cardiovascular;  Laterality: N/A;  . LEFT HEART CATHETERIZATION WITH CORONARY ANGIOGRAM N/A 06/26/2013   Procedure: LEFT HEART CATHETERIZATION WITH CORONARY ANGIOGRAM;  Surgeon: [name], MD;  Location: [facility] CATH [facility];  Service: Cardiovascular;  Laterality: N/A;  . TEE WITHOUT CARDIOVERSION N/A 11/07/2016   Procedure: TRANSESOPHAGEAL ECHOCARDIOGRAM (TEE);  Surgeon: Grace Isaac, MD;  Location: [place];  Service: Open Heart Surgery;  Laterality: N/A;  . TONSILLECTOMY    . TOTAL HIP ARTHROPLASTY Right 04/12/2016   Procedure: RIGHT TOTAL HIP  ARTHROPLASTY ANTERIOR APPROACH;  Surgeon: Paralee Cancel, MD;  Location: [facility];  Service: Orthopedics;  Laterality: Right;  requests 70 mins  . TOTAL SHOULDER ARTHROPLASTY  03/01/2012   Procedure: TOTAL SHOULDER ARTHROPLASTY;  Surgeon: Marin Shutter, MD;  Location: [facility];  Service: Orthopedics;  Laterality: Right;  . UMBILICAL HERNIA REPAIR       Medications: Current Meds  Medication Sig  . acetaminophen (TYLENOL) 500 MG tablet Take 1,000 mg by mouth every 8 (eight) hours as needed (pain).   [name] ALPRAZolam (XANAX) 0.5 MG tablet Take 0.5 tablets (0.25 mg total) by mouth daily as needed for anxiety.  [name] aspirin 81 MG tablet Take 81 mg by mouth at bedtime.   [name] atenolol (TENORMIN) 50 MG tablet Take 1 tablet (50 mg total)  by mouth at bedtime.  [name] atorvastatin (LIPITOR) 80 MG tablet Take 80 mg by mouth daily at 6 PM.  . canagliflozin (INVOKANA) 300 MG TABS tablet Take 300 mg by mouth daily before breakfast.  . Cholecalciferol (VITAMIN D PO) Take 1 capsule by mouth daily.  . clopidogrel (PLAVIX) 75 MG tablet Take 1 tablet (75 mg total) by mouth daily with breakfast.  . docusate sodium (COLACE) 100 MG capsule Take 100 mg by mouth daily as needed for mild constipation.  . gabapentin (NEURONTIN) 300 MG capsule Take 300 mg by mouth at bedtime.   . metFORMIN (FORTAMET) 1000 MG (OSM) 24 hr tablet Take 1 tablet (1,000 mg total) by mouth 2 (two) times daily with a meal.  . oxyCODONE (OXY IR/ROXICODONE) 5 MG immediate release tablet Take 1-2 tablets (5-10 mg total) by mouth every 6 (six) hours as needed for severe pain.  . pantoprazole (PROTONIX) 40 MG tablet Take 40 mg by mouth 2 (two) times daily.  . polyethylene glycol (MIRALAX / GLYCOLAX) packet Take 17 g by mouth daily as needed for mild constipation.  . [DISCONTINUED] furosemide (LASIX) 40 MG tablet Take 1 tablet (40 mg total) by mouth daily.  . [DISCONTINUED] potassium chloride SA (K-DUR,KLOR-CON) 20 MEQ tablet Take 1 tablet (20 mEq total) by mouth daily.      Allergies: No Known Allergies  Social History: The patient  reports that he has never smoked. He has never used smokeless tobacco. He reports that he does not drink alcohol or use drugs.   Family History: The patient's family history includes Coronary artery disease in his cousin; Diabetes type II in his mother; Heart attack in his father; Heart disease in his brother.   Review of Systems: Please see the history of present illness.   Otherwise, the review of systems is positive for none.   All other systems are reviewed and negative.   Physical Exam: VS:  BP (!) 148/80 (BP Location: Left Arm, Patient Position: Sitting, Cuff Size: Normal)   Pulse 76   Ht 5\' 9"  (1.753 m)   Wt 188 lb 6.4 oz (85.5 kg)   BMI 27.82 kg/m  .  BMI Body mass index is 27.82 kg/m.  Wt Readings from Last 3 Encounters:  11/28/16 188 lb 6.4 oz (85.5 kg)  11/11/16 197 lb (89.4 kg)  11/03/16 187 lb 8 oz (85 kg)    General: Anxious. Alert and in no acute distress.  Looks a little pale.  HEENT: Normal.  Neck: Supple, no JVD, carotid bruits, or masses noted.  Cardiac: Regular rate and rhythm. No murmurs, rubs, or gallops. No edema. His sternum looks fine.  Respiratory:  Lungs are clear to auscultation bilaterally with normal work of breathing.  GI: Soft and nontender.  MS: No deformity or atrophy. Gait and ROM intact.  Skin: Warm and dry. Color is normal.  Neuro:  Strength and sensation are intact and no gross focal deficits noted.  Psych: Alert, appropriate and with normal affect.   LABORATORY DATA:  EKG:  EKG is ordered today. This demonstrates NSR with lateral T wave changes.  Lab Results  Component Value Date   WBC 6.0 11/10/2016   HGB 9.7 (L) 11/10/2016   HCT 29.4 (L) 11/10/2016   PLT 133 (L) 11/10/2016   GLUCOSE 114 (H) 11/10/2016   CHOL 84 10/28/2016   TRIG 31 10/28/2016   HDL 36 (L) 10/28/2016   LDLCALC 42 10/28/2016   ALT 44 11/03/2016   AST 27 11/03/2016  NA 134 (L) 11/10/2016     K 3.9 11/10/2016   CL 102 11/10/2016   CREATININE 1.13 11/10/2016   BUN 20 11/10/2016   CO2 26 11/10/2016   TSH 2.009 06/06/2016   INR 1.51 11/07/2016   HGBA1C 7.5 (H) 11/03/2016     BNP (last 3 results)  Recent Labs  06/06/16 1818  BNP 54.3    ProBNP (last 3 results) No results for input(s): PROBNP in the last 8760 hours.   Other Studies Reviewed Today:  SURGICAL PROCEDURE 10/2016:  Coronary artery bypass grafting x5 with the left internal mammary to the distal left anterior descending coronary artery, reverse saphenous vein graft to the diagonal, reverse saphenous vein graft to the first obtuse marginal, sequential reverse saphenous vein graft to the proximal posterior lateral branch and to the distal posterior descending with right leg, thigh, and calf greater saphenous vein harvesting endoscopically.  Assessment/Plan: 1. CAD with history of chronic angina - prior DES x 3 to the LAD, he has had PCI with DES x 1 to the proximal PLA and balloon PCI to the proximal PDA - now s/p CABG x 5 - slow progress - he is trying to "speed it up" - discussed at length how he is still very early on in the post op phase. Needs to pace himself - discussed in great detail and at great length today.  Message sent to cardiac rehab. Lab today. Using the NTG x 1 is a little worrisome - hopefully this was a one time thing. I will see back in a month. Seeing EBG later this month.   2. HTN - BP better at home. Would follow for now.   3. HLD - on statin therapy  4. DM - followed by Dr. Joylene Draft.  5. Previously noted mildly dilated aortic root/ascending aorta from outside institution - not seen on our last echo from May 2015 - would follow for now. Continue with BP control.   6. Right hip pain - not discussed today.   Current medicines are reviewed with the patient today.  The patient does not have concerns regarding medicines other than what has been noted above.  The following  changes have been made:  See above.  Labs/ tests ordered today include:    Orders Placed This Encounter  Procedures  . Basic metabolic panel  . CBC  . EKG 12-Lead     Disposition:   FU with me in one month.    Patient is agreeable to this plan and will call if any problems develop in the interim.   SignedTruitt Merle, NP  11/28/2016 10:45 AM  [facility] [address] [place], [place]  [address] Phone: [phone] Fax: [phone]

## 2016-12-07 ENCOUNTER — Other Ambulatory Visit: Payer: Self-pay | Admitting: Cardiothoracic Surgery

## 2016-12-07 DIAGNOSIS — Z951 Presence of aortocoronary bypass graft: Secondary | ICD-10-CM

## 2016-12-08 ENCOUNTER — Ambulatory Visit (INDEPENDENT_AMBULATORY_CARE_PROVIDER_SITE_OTHER): Payer: Self-pay | Admitting: Cardiothoracic Surgery

## 2016-12-08 ENCOUNTER — Telehealth (HOSPITAL_COMMUNITY): Payer: Self-pay

## 2016-12-08 ENCOUNTER — Encounter: Payer: Self-pay | Admitting: Cardiothoracic Surgery

## 2016-12-08 ENCOUNTER — Ambulatory Visit
Admission: RE | Admit: 2016-12-08 | Discharge: 2016-12-08 | Disposition: A | Discharge status: Home / Self Care | Payer: PPO | Source: Ambulatory Visit | Attending: Cardiothoracic Surgery | Admitting: Cardiothoracic Surgery

## 2016-12-08 VITALS — BP 155/85 | HR 70 | Ht 69.0 in | Wt 186.0 lb

## 2016-12-08 DIAGNOSIS — J90 Pleural effusion, not elsewhere classified: Secondary | ICD-10-CM | POA: Diagnosis not present

## 2016-12-08 DIAGNOSIS — I25110 Atherosclerotic heart disease of native coronary artery with unstable angina pectoris: Secondary | ICD-10-CM

## 2016-12-08 DIAGNOSIS — Z951 Presence of aortocoronary bypass graft: Secondary | ICD-10-CM

## 2016-12-08 MED ORDER — TRAMADOL HCL 50 MG PO TABS: 50.0000 mg | ORAL_TABLET | Freq: Four times a day (QID) | ORAL | 0 refills | Status: DC | PRN

## 2016-12-08 NOTE — Patient Instructions (Signed)
[address]       [place] [address]             [phone]       Coronary Artery Bypass Grafting  No lifting over 25 lbs for 3 months   Care After  Refer to this sheet in the next few weeks. These instructions provide you with information on caring for yourself after your procedure. Your caregiver may also give you more specific instructions. Your treatment has been planned according to current medical practices, but problems sometimes occur. Call your caregiver if you have any problems or questions after your procedure.  Recovery from open heart surgery will be different for everyone. Some people feel well after 3 or 4 weeks, while for others it takes longer. After heart surgery, it may be normal to:  Not have an appetite, feel nauseated by the smell of food, or only want to eat a small amount.   Be constipated because of changes in your diet, activity, and medicines. Eat foods high in fiber. Add fresh fruits and vegetables to your diet. Stool softeners may be helpful.   Feel sad or unhappy. You may be frustrated or cranky. You may have good days and bad days. Do not give up. Talk to your caregiver if you do not feel better.   Feel weakness and fatigue. You many need physical therapy or cardiac rehabilitation to get your strength back.   Develop an irregular heartbeat called atrial fibrillation. Symptoms of atrial fibrillation are a fast, irregular heartbeat or feelings of fluttery heartbeats, shortness of breath, low blood pressure, and dizziness. If these symptoms develop, see your caregiver right away.  MEDICATION  Have a list of all the medicines you will be taking when you leave the hospital. For every medicine, know the following:   Name.   Exact dose.   Time of day to be taken.   How often it should be taken.   Why you are taking it.   Ask which medicines should or should not be taken together. If you take more than one heart medicine, ask  if it is okay to take them together. Some heart medicines should not be taken at the same time because they may lower your blood pressure too much.   Narcotic pain medicine can cause constipation. Eat fresh fruits and vegetables. Add fiber to your diet. Stool softener medicine may help relieve constipation.   Keep a copy of your medicines with you at all times.   Do not add or stop taking any medicine until you check with your caregiver.   Medicines can have side effects. Call your caregiver who prescribed the medicine if you:   Start throwing up, have diarrhea, or have stomach pain.   Feel dizzy or lightheaded when you stand up.   Feel your heart is skipping beats or is beating too fast or too slow.   Develop a rash.   Notice unusual bruising or bleeding.  HOME CARE INSTRUCTIONS  After heart surgery, it is important to learn how to take your pulse. Have your caregiver show you how to take your pulse.   Use your incentive spirometer. Ask your caregiver how [place] after surgery you need to use it.  Care of your chest incision  Tell your caregiver right away if you notice clicking in your chest (sternum).   Support your chest with a pillow or your arms when you take deep breaths and cough.  Follow your caregiver's instructions about when you can bathe or swim.   Protect your incision from sunlight during the first year to keep the scar from getting dark.   Tell your caregiver if you notice:   Increased tenderness of your incision.   Increased redness or swelling around your incision.   Drainage or pus from your incision.  Care of your leg incision(s)  Avoid crossing your legs.   Avoid sitting for [place] periods of time. Change positions every half hour.   Elevate your leg(s) when you are sitting.   Check your leg(s) daily for swelling. Check the incisions for redness or drainage.   Diet is very important to heart health.   Eat plenty of fresh fruits and vegetables.  Meats should be lean cut. Avoid canned, processed, and fried foods.   Talk to a dietician. They can teach you how to make healthy food and drink choices.  Weight  Weigh yourself every day. This is important because it helps to know if you are retaining fluid that may make your heart and lungs work harder.   Use the same scale each time.   Weigh yourself every morning at the same time. You should do this after you go to the bathroom, but before you eat breakfast.   Your weight will be more accurate if you do not wear any clothes.   Record your weight.   Tell your caregiver if you have gained 2 pounds or more overnight.  Activity Stop any activity at once if you have chest pain, shortness of breath, irregular heartbeats, or dizziness. Get help right away if you have any of these symptoms.  Bathing.  Avoid soaking in a bath or hot tub until your incisions are healed.   Rest. You need a balance of rest and activity.   Exercise. Exercise per your caregiver's advice. You may need physical therapy or cardiac rehabilitation to help strengthen your muscles and build your endurance.   Climbing stairs. Unless your caregiver tells you not to climb stairs, go up stairs slowly and rest if you tire. Do not pull yourself up by the handrail.   Driving a car. Follow your caregiver's advice on when you may drive. You may ride as a passenger at any time. When traveling for [place] periods of time in a car, get out of the car and walk around for a few minutes every 2 hours.   Lifting. Avoid lifting, pushing, or pulling anything heavier than 10 pounds for 6 weeks after surgery or as told by your caregiver.   Returning to work. Check with your caregiver. People heal at different rates. Most people will be able to go back to work 6 to 12 weeks after surgery.   Sexual activity. You may resume sexual relations as told by your caregiver.  SEEK MEDICAL CARE IF:  Any of your incisions are red, painful, or have  any type of drainage coming from them.   You have an oral temperature above 101.5 F .   You have ankle or leg swelling.   You have pain in your legs.   You have weight gain of 2 or more pounds a day.   You feel dizzy or lightheaded when you stand up.  SEEK IMMEDIATE MEDICAL CARE IF:  You have angina or chest pain that goes to your jaw or arms. Call your local emergency services right away.   You have shortness of breath at rest or with activity.   You have a  fast or irregular heartbeat (arrhythmia).   There is a "clicking" in your sternum when you move.   You have numbness or weakness in your arms or legs.  MAKE SURE YOU:  Understand these instructions.   Will watch your condition.   Will get help right away if you are not doing well or get worse.    No lifting over 25 lbs for 3 months

## 2016-12-08 NOTE — Progress Notes (Signed)
[address]       ,[place] [address]             [facility] Record #[id number] Date of Birth: 02/16/1945  Referring: Sherren Mocha, MD Primary Care: Crist Infante, MD  Chief Complaint:   POST OP FOLLOW UP 11/07/2016   OPERATIVE REPORT SURGICAL PROCEDURE:  Coronary artery bypass grafting x5 with the left internal mammary to the distal left anterior descending coronary artery, reverse saphenous vein graft to the diagonal, reverse saphenous vein graft to the first obtuse marginal, sequential reverse saphenous vein graft to the proximal posterior lateral branch and to the distal posterior descending with right leg, thigh, and calf greater saphenous vein harvesting endoscopically. SURGEON:  Lanelle Bal, M.D.  History of Present Illness:     Patient returns after recent coronary artery bypass grafting on September 24.  He is gradually improving.  He noted some increasing chest discomfort as he over exerted.  He has had no overt symptoms of congestive heart failure.  He notes that his appetite has improved.  He is walking 1-1-1/2 miles a day.  He did take nitroglycerin one time for sharp stabbing pain in the left anterior chest but this is not recurred. He is to start in cardiac rehab next week    Past Medical History:  Diagnosis Date  . Arthritis    "lower back; right shoulder; left thumb; joints" (06/21/2013)  . Asthma    "not sure if this is true or not" (06/21/2013)  . CAD (coronary artery disease) May 2015   s/p PTCA/DES x 3 to mid LAD, mod non-ob disease in Cx and RCA May 2015; s/p repeat caths - last study in November of 2015 - stents patent, - FFR - managed medically.   [name] GERD (gastroesophageal reflux disease)   . Gout    "maybe twice in my life"  . Headache(784.0)    "weekly for the last 3-4 months" (06/21/2013)  . Hyperlipidemia   . Hypertension   . Myocardial infarction [facility])    "/Dr. Katharina Caper I've had  one between 2014-2015) (06/21/2013)  . Osteoarthritis   . Sleep apnea    WEIGHT LOSS, NO LONGER NEEDS PER PATIENT  . Type II diabetes mellitus (HCC)    TYPE 2     History  Smoking Status  . Never Smoker  Smokeless Tobacco  . Never Used    History  Alcohol Use No     Allergies  Allergen Reactions  . Oxycodone     nausea    Current Outpatient Prescriptions  Medication Sig Dispense Refill  . acetaminophen (TYLENOL) 500 MG tablet Take 1,000 mg by mouth every 8 (eight) hours as needed (pain).     [name] ALPRAZolam (XANAX) 0.5 MG tablet Take 0.5 tablets (0.25 mg total) by mouth daily as needed for anxiety.  0  . aspirin 81 MG tablet Take 81 mg by mouth at bedtime.     [name] atenolol (TENORMIN) 50 MG tablet Take 1 tablet (50 mg total) by mouth at bedtime. 90 tablet 3  . atorvastatin (LIPITOR) 80 MG tablet Take 80 mg by mouth daily at 6 PM.    . canagliflozin (INVOKANA) 300 MG TABS tablet Take 300 mg by mouth daily before breakfast.    . Cholecalciferol (VITAMIN D PO) Take 1 capsule by mouth daily.    . clopidogrel (PLAVIX) 75 MG tablet Take 1 tablet (75 mg  total) by mouth daily with breakfast. 90 tablet 3  . docusate sodium (COLACE) 100 MG capsule Take 100 mg by mouth daily as needed for mild constipation.    . gabapentin (NEURONTIN) 300 MG capsule Take 300 mg by mouth at bedtime.     . metFORMIN (FORTAMET) 1000 MG (OSM) 24 hr tablet Take 1 tablet (1,000 mg total) by mouth 2 (two) times daily with a meal. 60 tablet 1  . pantoprazole (PROTONIX) 40 MG tablet Take 40 mg by mouth 2 (two) times daily.    . polyethylene glycol (MIRALAX / GLYCOLAX) packet Take 17 g by mouth daily as needed for mild constipation.    . traMADol (ULTRAM) 50 MG tablet Take 1 tablet (50 mg total) by mouth every 6 (six) hours as needed for moderate pain. 30 tablet 0   No current facility-administered medications for this visit.        Physical Exam: BP (!) 155/85   Pulse 70   Ht 5\' 9"  (1.753 m)   Wt 186 lb  (84.4 kg)   SpO2 98%   BMI 27.47 kg/m   General appearance: alert and cooperative Neurologic: intact Heart: regular rate and rhythm, S1, S2 normal, no murmur, click, rub or gallop Lungs: clear to auscultation bilaterally Abdomen: soft, non-tender; bowel sounds normal; no masses,  no organomegaly Extremities: extremities normal, atraumatic, no cyanosis or edema and Homans sign is negative, no sign of DVT Wound: Sternum is stable and well-healed   Diagnostic Studies & Laboratory data:     Recent Radiology Findings:   Dg Chest 2 View  Result Date: 12/08/2016 CLINICAL DATA:  One month postop from CABG. Followup pleural effusion and atelectasis. EXAM: CHEST  2 VIEW COMPARISON:  11/10/2016 FINDINGS: Heart size is normal. Both lungs are clear. There has been interval resolution of small left pleural effusion and left basilar atelectasis since previous study. No pneumothorax visualized. Prior CABG again noted as well as right shoulder prosthesis. IMPRESSION: Interval resolution of small left pleural effusion and left basilar atelectasis . No active cardiopulmonary disease. Electronically Signed   By: Earle Gell M.D.   On: 12/08/2016 08:44   I have independently reviewed the above radiology studies  and reviewed the findings with the patient.   Recent Lab Findings: Lab Results  Component Value Date   WBC 4.1 11/28/2016   HGB 10.5 (L) 11/28/2016   HCT 31.7 (L) 11/28/2016   PLT 240 11/28/2016   GLUCOSE 146 (H) 11/28/2016   CHOL 84 10/28/2016   TRIG 31 10/28/2016   HDL 36 (L) 10/28/2016   LDLCALC 42 10/28/2016   ALT 44 11/03/2016   AST 27 11/03/2016   NA 142 11/28/2016   K 5.3 (H) 11/28/2016   CL 105 11/28/2016   CREATININE 0.82 11/28/2016   BUN 13 11/28/2016   CO2 22 11/28/2016   TSH 2.009 06/06/2016   INR 1.51 11/07/2016   HGBA1C 7.5 (H) 11/03/2016      Assessment / Plan:      Patient doing well postoperatively without evidence of congestive heart failure.  Increasing  physical activity appropriately He noted oxycodone was making him nauseated, he was given 30 tablets of tramadol Continues on Plavix and statin therapy Plan to see back in 4-6 weeks     Grace Isaac MD      [address] [place] [address] Office [phone]   Beeper [phone]  12/08/2016 10:18 AM

## 2016-12-12 ENCOUNTER — Telehealth (HOSPITAL_COMMUNITY): Payer: Self-pay

## 2016-12-12 NOTE — Telephone Encounter (Signed)
UPDATED: Patients insurance is active and covered through [employer] - No co-pay, no deductible, out of pocket amount is $3,400/$510 has been met, no co-insurance, and no pre-authorization is required. Passport/reference # [id number]

## 2016-12-12 NOTE — Telephone Encounter (Signed)
Cardiac Rehab Medication Review by a Pharmacist  Does the patient  feel that his/her medications are working for him/her?  yes  Has the patient been experiencing any side effects to the medications prescribed?  no  Does the patient measure his/her own blood pressure or blood glucose at home?  yes   Does the patient have any problems obtaining medications due to transportation or finances?   no  Understanding of regimen: good Understanding of indications: good Potential of compliance: good    Pharmacist comments: Spoke with patient who passed the phone to his wife to talk about medications. She was knowledgeable about the patients current regimen. She reported no issues in obtaining meds and no adverse effects.  Wife and patient are currently enrolled in [facility] c.diff trial where they get weekly injections of study med or NS.    [name] A Hunter Mcbride 12/12/2016 9:09 AM

## 2016-12-13 ENCOUNTER — Encounter (HOSPITAL_COMMUNITY)
Admission: RE | Admit: 2016-12-13 | Discharge: 2016-12-13 | Disposition: A | Discharge status: Home / Self Care | Payer: PPO | Source: Ambulatory Visit | Attending: Cardiovascular Disease | Admitting: Cardiovascular Disease

## 2016-12-13 ENCOUNTER — Encounter (HOSPITAL_COMMUNITY): Payer: Self-pay

## 2016-12-13 ENCOUNTER — Telehealth: Payer: Self-pay | Admitting: Nurse Practitioner

## 2016-12-13 VITALS — BP 142/70 | HR 66 | Ht 68.25 in | Wt 190.5 lb

## 2016-12-13 DIAGNOSIS — Z951 Presence of aortocoronary bypass graft: Secondary | ICD-10-CM

## 2016-12-13 MED ORDER — AMLODIPINE BESYLATE 5 MG PO TABS: 5.0000 mg | ORAL_TABLET | Freq: Every day | ORAL | 3 refills | Status: DC

## 2016-12-13 NOTE — Progress Notes (Signed)
Cardiac Individual Treatment Plan  Patient Details  Name: Hunter Mcbride. MRN: [id number] Date of Birth: 1946-01-15 Referring Provider:     CARDIAC REHAB PHASE II ORIENTATION from 12/13/2016 in [place]  Referring Provider  Sherren Mocha, MD      Initial Encounter Date:    CARDIAC REHAB PHASE II ORIENTATION from 12/13/2016 in [place]  Date  12/13/16  Referring Provider  Sherren Mocha, MD      Visit Diagnosis: S/P CABG x 5  Patient's Home Medications on Admission:  Current Outpatient Prescriptions:  .  acetaminophen (TYLENOL) 500 MG tablet, Take 1,000 mg by mouth every 8 (eight) hours as needed (pain). , Disp: , Rfl:  .  ALPRAZolam (XANAX) 0.5 MG tablet, Take 0.5 tablets (0.25 mg total) by mouth daily as needed for anxiety., Disp: , Rfl: 0 .  aspirin 81 MG tablet, Take 81 mg by mouth at bedtime. , Disp: , Rfl:  .  atenolol (TENORMIN) 50 MG tablet, Take 1 tablet (50 mg total) by mouth at bedtime., Disp: 90 tablet, Rfl: 3 .  atorvastatin (LIPITOR) 80 MG tablet, Take 80 mg by mouth daily at 6 PM., Disp: , Rfl:  .  canagliflozin (INVOKANA) 300 MG TABS tablet, Take 300 mg by mouth daily before breakfast., Disp: , Rfl:  .  Cholecalciferol (VITAMIN D PO), Take 1 capsule by mouth daily., Disp: , Rfl:  .  clopidogrel (PLAVIX) 75 MG tablet, Take 1 tablet (75 mg total) by mouth daily with breakfast., Disp: 90 tablet, Rfl: 3 .  docusate sodium (COLACE) 100 MG capsule, Take 100 mg by mouth daily as needed for mild constipation., Disp: , Rfl:  .  gabapentin (NEURONTIN) 300 MG capsule, Take 300 mg by mouth at bedtime. , Disp: , Rfl:  .  metFORMIN (FORTAMET) 1000 MG (OSM) 24 hr tablet, Take 1 tablet (1,000 mg total) by mouth 2 (two) times daily with a meal., Disp: 60 tablet, Rfl: 1 .  pantoprazole (PROTONIX) 40 MG tablet, Take 40 mg by mouth 2 (two) times daily., Disp: , Rfl:  .  polyethylene glycol (MIRALAX / GLYCOLAX) packet,  Take 17 g by mouth daily as needed for mild constipation., Disp: , Rfl:  .  traMADol (ULTRAM) 50 MG tablet, Take 1 tablet (50 mg total) by mouth every 6 (six) hours as needed for moderate pain., Disp: 30 tablet, Rfl: 0  Past Medical History: Past Medical History:  Diagnosis Date  . Arthritis    "lower back; right shoulder; left thumb; joints" (06/21/2013)  . Asthma    "not sure if this is true or not" (06/21/2013)  . CAD (coronary artery disease) May 2015   s/p PTCA/DES x 3 to mid LAD, mod non-ob disease in Cx and RCA May 2015; s/p repeat caths - last study in November of 2015 - stents patent, - FFR - managed medically.   [name] GERD (gastroesophageal reflux disease)   . Gout    "maybe twice in my life"  . Headache(784.0)    "weekly for the last 3-4 months" (06/21/2013)  . Hyperlipidemia   . Hypertension   . Myocardial infarction [facility])    "/Dr. Katharina Caper I've had one between 2014-2015) (06/21/2013)  . Osteoarthritis   . Sleep apnea    WEIGHT LOSS, NO LONGER NEEDS PER PATIENT  . Type II diabetes mellitus (HCC)    TYPE 2    Tobacco Use: History  Smoking Status  . Never Smoker  Smokeless  Tobacco  . Never Used    Labs: Recent Review Flowsheet Data    Labs for ITP Cardiac and Pulmonary Rehab Latest Ref Rng & Units 11/07/2016 11/07/2016 11/07/2016 11/07/2016 11/08/2016   Cholestrol 0 - 200 mg/dL - - - - -   LDLCALC 0 - 99 mg/dL - - - - -   HDL >40 mg/dL - - - - -   Trlycerides <150 mg/dL - - - - -   Hemoglobin A1c 4.8 - 5.6 % - - - - -   PHART 7.350 - 7.450 7.450 7.284(L) 7.290(L) - -   PCO2ART 32.0 - 48.0 mmHg 33.8 47.1 47.4 - -   HCO3 20.0 - 28.0 mmol/L 23.8 22.2 22.5 - -   TCO2 22 - 32 mmol/L 25 24 24 23 25    ACIDBASEDEF 0.0 - 2.0 mmol/L - 4.0(H) 4.0(H) - -   O2SAT % 99.0 95.0 96.0 - -      Capillary Blood Glucose: Lab Results  Component Value Date   GLUCAP 132 (H) 11/12/2016   GLUCAP 204 (H) 11/11/2016   GLUCAP 135 (H) 11/11/2016   GLUCAP 91 11/11/2016   GLUCAP 144 (H) 11/10/2016      Exercise Target Goals: Date: 12/13/16  Exercise Program Goal: Individual exercise prescription set with THRR, safety & activity barriers. Participant demonstrates ability to understand and report RPE using BORG scale, to self-measure pulse accurately, and to acknowledge the importance of the exercise prescription.  Exercise Prescription Goal: Starting with aerobic activity 30 plus minutes a day, 3 days per week for initial exercise prescription. Provide home exercise prescription and guidelines that participant acknowledges understanding prior to discharge.  Activity Barriers & Risk Stratification:     Activity Barriers & Cardiac Risk Stratification - 12/13/16 0943      Activity Barriers & Cardiac Risk Stratification   Activity Barriers Right Hip Replacement;Deconditioning;Muscular Weakness;Joint Problems;Other (comment)   Comments R shoulde replacement   Cardiac Risk Stratification High      6 Minute Walk:     6 Minute Walk    Row Name 12/13/16 1023         6 Minute Walk   Phase Initial     Distance 1793 feet     Walk Time 6 minutes     # of Rest Breaks 0     MPH 3.4     METS 4.1     RPE 11     VO2 Peak 14.3     Symptoms Yes (comment)     Comments R hip pain, "clicking"     Resting HR 66 bpm     Resting BP 142/70     Resting Oxygen Saturation  100 %     Exercise Oxygen Saturation  during 6 min walk 99 %     Max Ex. HR 128 bpm     Max Ex. BP 142/84     2 Minute Post BP 142/70        Oxygen Initial Assessment:   Oxygen Re-Evaluation:   Oxygen Discharge (Final Oxygen Re-Evaluation):   Initial Exercise Prescription:     Initial Exercise Prescription - 12/13/16 1000      Date of Initial Exercise RX and Referring Provider   Date 12/13/16   Referring Provider Sherren Mocha, MD     Treadmill   MPH 2.8   Grade 1   Minutes 10   METs 3.53     Bike   Level 1   Minutes 10   METs 3.22  NuStep   Level 3   SPM 80   Minutes 10   METs 3      Prescription Details   Frequency (times per week) 3   Duration Progress to 30 minutes of continuous aerobic without signs/symptoms of physical distress     Intensity   THRR 40-80% of Max Heartrate 60-120   Ratings of Perceived Exertion 11-13   Perceived Dyspnea 0-4     Progression   Progression Continue to progress workloads to maintain intensity without signs/symptoms of physical distress.     Resistance Training   Training Prescription Yes   Weight 3lbs   Reps 10-15      Perform Capillary Blood Glucose checks as needed.  Exercise Prescription Changes:   Exercise Comments:   Exercise Goals and Review:     Exercise Goals    Row Name 12/13/16 0943             Exercise Goals   Increase Physical Activity Yes       Intervention Provide advice, education, support and counseling about physical activity/exercise needs.;Develop an individualized exercise prescription for aerobic and resistive training based on initial evaluation findings, risk stratification, comorbidities and participant's personal goals.       Expected Outcomes Achievement of increased cardiorespiratory fitness and enhanced flexibility, muscular endurance and strength shown through measurements of functional capacity and personal statement of participant.       Increase Strength and Stamina Yes   return to fishing and hunting. [place] term:  be able to climb trees, pull a bow & arrow and pull tiller and chain saw        Intervention Provide advice, education, support and counseling about physical activity/exercise needs.;Develop an individualized exercise prescription for aerobic and resistive training based on initial evaluation findings, risk stratification, comorbidities and participant's personal goals.       Expected Outcomes Achievement of increased cardiorespiratory fitness and enhanced flexibility, muscular endurance and strength shown through measurements of functional capacity and personal statement of  participant.       Able to understand and use rate of perceived exertion (RPE) scale Yes       Intervention Provide education and explanation on how to use RPE scale       Expected Outcomes Short Term: Able to use RPE daily in rehab to express subjective intensity level;Herman Term:  Able to use RPE to guide intensity level when exercising independently       Knowledge and understanding of Target Heart Rate Range (THRR) Yes       Intervention Provide education and explanation of THRR including how the numbers were predicted and where they are located for reference       Expected Outcomes Short Term: Able to state/look up THRR;[place]inski Term: Able to use THRR to govern intensity when exercising independently;Short Term: Able to use daily as guideline for intensity in rehab       Able to check pulse independently Yes       Intervention Provide education and demonstration on how to check pulse in carotid and radial arteries.;Review the importance of being able to check your own pulse for safety during independent exercise       Expected Outcomes Short Term: Able to explain why pulse checking is important during independent exercise;[place] Term: Able to check pulse independently and accurately       Understanding of Exercise Prescription Yes       Intervention Provide education, explanation, and written materials on patient's individual exercise  prescription       Expected Outcomes Short Term: Able to explain program exercise prescription;[place] Term: Able to explain home exercise prescription to exercise independently          Exercise Goals Re-Evaluation :    Discharge Exercise Prescription (Final Exercise Prescription Changes):   Nutrition:  Target Goals: Understanding of nutrition guidelines, daily intake of sodium 1500mg , cholesterol 200mg , calories 30% from fat and 7% or less from saturated fats, daily to have 5 or more servings of fruits and vegetables.  Biometrics:     Pre Biometrics -  12/13/16 1205      Pre Biometrics   Height 5' 8.25" (1.734 m)   Weight 190 lb 7.6 oz (86.4 kg)   Waist Circumference 37.75 inches   Hip Circumference 37.75 inches   Waist to Hip Ratio 1 %   BMI (Calculated) 28.74   Triceps Skinfold 20 mm   % Body Fat 28.1 %   Grip Strength 41 kg   Flexibility 11.75 in   Single Leg Stand 30 seconds       Nutrition Therapy Plan and Nutrition Goals:   Nutrition Discharge: Nutrition Scores:   Nutrition Goals Re-Evaluation:   Nutrition Goals Re-Evaluation:   Nutrition Goals Discharge (Final Nutrition Goals Re-Evaluation):   Psychosocial: Target Goals: Acknowledge presence or absence of significant depression and/or stress, maximize coping skills, provide positive support system. Participant is able to verbalize types and ability to use techniques and skills needed for reducing stress and depression.  Initial Review & Psychosocial Screening:     Initial Psych Review & Screening - 12/13/16 1027      Initial Review   Current issues with None Identified     Family Dynamics   Good Support System? Yes  spouse, children, family     Barriers   Psychosocial barriers to participate in program There are no identifiable barriers or psychosocial needs.     Screening Interventions   Interventions To provide support and resources with identified psychosocial needs;Encouraged to exercise      Quality of Life Scores:     Quality of Life - 12/13/16 1024      Quality of Life Scores   Health/Function Pre 23.6 %   Socioeconomic Pre 30 %   Psych/Spiritual Pre 29.14 %   Family Pre 28.8 %   GLOBAL Pre 26.91 %      PHQ-9: Recent Review Flowsheet Data    There is no flowsheet data to display.     Interpretation of Total Score  Total Score Depression Severity:  1-4 = Minimal depression, 5-9 = Mild depression, 10-14 = Moderate depression, 15-19 = Moderately severe depression, 20-27 = Severe depression   Psychosocial Evaluation and  Intervention:   Psychosocial Re-Evaluation:   Psychosocial Discharge (Final Psychosocial Re-Evaluation):   Vocational Rehabilitation: Provide vocational rehab assistance to qualifying candidates.   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 Participant will state understanding/return demonstration of topics presented.  Learning Barriers/Preferences:     Learning Barriers/Preferences - 12/13/16 4403      Learning Barriers/Preferences   Learning Barriers Sight  readers   Learning Preferences Video;Written Material;Pictoral;Computer/Internet;Skilled Demonstration      Education Topics: Count Your Pulse:  -  Group instruction provided by verbal instruction, demonstration, patient participation and written materials to support subject.  Instructors address importance of being able to find your pulse and how to count your pulse when at home without a heart monitor.  Patients get hands on experience counting their pulse with staff help and individually.   Heart Attack, Angina, and Risk Factor Modification:  -Group instruction provided by verbal instruction, video, and written materials to support subject.  Instructors address signs and symptoms of angina and heart attacks.    Also discuss risk factors for heart disease and how to make changes to improve heart health risk factors.   Functional Fitness:  -Group instruction provided by verbal instruction, demonstration, patient participation, and written materials to support subject.  Instructors address safety measures for doing things around the house.  Discuss how to get up and down off the floor, how to pick things up properly, how to  safely get out of a chair without assistance, and balance training.   Meditation and Mindfulness:  -Group instruction provided by verbal instruction, patient participation, and written materials to support subject.  Instructor addresses importance of mindfulness and meditation practice to help reduce stress and improve awareness.  Instructor also leads participants through a meditation exercise.    Stretching for Flexibility and Mobility:  -Group instruction provided by verbal instruction, patient participation, and written materials to support subject.  Instructors lead participants through series of stretches that are designed to increase flexibility thus improving mobility.  These stretches are additional exercise for major muscle groups that are typically performed during regular warm up and cool down.   Hands Only CPR:  -Group verbal, video, and participation provides a basic overview of AHA guidelines for community CPR. Role-play of emergencies allow participants the opportunity to practice calling for help and chest compression technique with discussion of AED use.   Hypertension: -Group verbal and written instruction that provides a basic overview of hypertension including the most recent diagnostic guidelines, risk factor reduction with self-care instructions and medication management.    Nutrition I class: Heart Healthy Eating:  -Group instruction provided by PowerPoint slides, verbal discussion, and written materials to support subject matter. The instructor gives an explanation and review of the Therapeutic Lifestyle Changes diet recommendations, which includes a discussion on lipid goals, dietary fat, sodium, fiber, plant stanol/sterol esters, sugar, and the components of a well-balanced, healthy diet.   Nutrition II class: Lifestyle Skills:  -Group instruction provided by PowerPoint slides, verbal discussion, and written materials to support subject matter. The instructor gives  an explanation and review of label reading, grocery shopping for heart health, heart healthy recipe modifications, and ways to make healthier choices when eating out.   Diabetes Question & Answer:  -Group instruction provided by PowerPoint slides, verbal discussion, and written materials to support subject matter. The instructor gives an explanation and review of diabetes co-morbidities, pre- and post-prandial blood glucose goals, pre-exercise blood glucose goals, signs, symptoms, and treatment of hypoglycemia and hyperglycemia, and foot care basics.   Diabetes Blitz:  -Group instruction provided by PowerPoint slides, verbal discussion, and written materials to support subject matter. The instructor gives an explanation and review of the physiology behind type 1 and type 2 diabetes, diabetes medications and rational behind using different medications, pre- and post-prandial blood glucose recommendations and Hemoglobin A1c goals, diabetes diet, and exercise including blood glucose guidelines for exercising safely.    Portion Distortion:  -Group instruction provided by PowerPoint slides, verbal discussion, written materials, and food  models to support subject matter. The instructor gives an explanation of serving size versus portion size, changes in portions sizes over the last 20 years, and what consists of a serving from each food group.   Stress Management:  -Group instruction provided by verbal instruction, video, and written materials to support subject matter.  Instructors review role of stress in heart disease and how to cope with stress positively.     Exercising on Your Own:  -Group instruction provided by verbal instruction, power point, and written materials to support subject.  Instructors discuss benefits of exercise, components of exercise, frequency and intensity of exercise, and end points for exercise.  Also discuss use of nitroglycerin and activating EMS.  Review options of places  to exercise outside of rehab.  Review guidelines for sex with heart disease.   Cardiac Drugs I:  -Group instruction provided by verbal instruction and written materials to support subject.  Instructor reviews cardiac drug classes: antiplatelets, anticoagulants, beta blockers, and statins.  Instructor discusses reasons, side effects, and lifestyle considerations for each drug class.   Cardiac Drugs II:  -Group instruction provided by verbal instruction and written materials to support subject.  Instructor reviews cardiac drug classes: angiotensin converting enzyme inhibitors (ACE-I), angiotensin II receptor blockers (ARBs), nitrates, and calcium channel blockers.  Instructor discusses reasons, side effects, and lifestyle considerations for each drug class.   Anatomy and Physiology of the Circulatory System:  Group verbal and written instruction and models provide basic cardiac anatomy and physiology, with the coronary electrical and arterial systems. Review of: AMI, Angina, Valve disease, Heart Failure, Peripheral Artery Disease, Cardiac Arrhythmia, Pacemakers, and the ICD.   Other Education:  -Group or individual verbal, written, or video instructions that support the educational goals of the cardiac rehab program.   Knowledge Questionnaire Score:     Knowledge Questionnaire Score - 12/13/16 0912      Knowledge Questionnaire Score   Pre Score 23/24      Core Components/Risk Factors/Patient Goals at Admission:     Personal Goals and Risk Factors at Admission - 12/13/16 1030      Core Components/Risk Factors/Patient Goals on Admission    Weight Management Yes;Weight Maintenance;Weight Loss   Intervention Weight Management: Develop a combined nutrition and exercise program designed to reach desired caloric intake, while maintaining appropriate intake of nutrient and fiber, sodium and fats, and appropriate energy expenditure required for the weight goal.;Weight Management: Provide  education and appropriate resources to help participant work on and attain dietary goals.;Weight Management/Obesity: Establish reasonable short term and Ho term weight goals.   Admit Weight 190 lb 7.6 oz (86.4 kg)   Goal Weight: Short Term 180 lb (81.6 kg)   Goal Weight: [place] Term 175 lb (79.4 kg)   Expected Outcomes Short Term: Continue to assess and modify interventions until short term weight is achieved;[place] Term: Adherence to nutrition and physical activity/exercise program aimed toward attainment of established weight goal;Weight Maintenance: Understanding of the daily nutrition guidelines, which includes 25-35% calories from fat, 7% or less cal from saturated fats, less than 200mg  cholesterol, less than 1.5gm of sodium, & 5 or more servings of fruits and vegetables daily;Weight Loss: Understanding of general recommendations for a balanced deficit meal plan, which promotes 1-2 lb weight loss per week and includes a negative energy balance of [phone] kcal/d;Understanding recommendations for meals to include 15-35% energy as protein, 25-35% energy from fat, 35-60% energy from carbohydrates, less than 200mg  of dietary cholesterol, 20-35 gm of total fiber daily;Understanding  of distribution of calorie intake throughout the day with the consumption of 4-5 meals/snacks   Diabetes Yes   Intervention Provide education about signs/symptoms and action to take for hypo/hyperglycemia.;Provide education about proper nutrition, including hydration, and aerobic/resistive exercise prescription along with prescribed medications to achieve blood glucose in normal ranges: Fasting glucose 65-99 mg/dL   Expected Outcomes Short Term: Participant verbalizes understanding of the signs/symptoms and immediate care of hyper/hypoglycemia, proper foot care and importance of medication, aerobic/resistive exercise and nutrition plan for blood glucose control.;[place] Term: Attainment of HbA1C < 7%.   Hypertension Yes   Intervention  Provide education on lifestyle modifcations including regular physical activity/exercise, weight management, moderate sodium restriction and increased consumption of fresh fruit, vegetables, and low fat dairy, alcohol moderation, and smoking cessation.;Monitor prescription use compliance.   Expected Outcomes Short Term: Continued assessment and intervention until BP is < 140/61mm HG in hypertensive participants. < 130/95mm HG in hypertensive participants with diabetes, heart failure or chronic kidney disease.;[place]y Term: Maintenance of blood pressure at goal levels.   Lipids Yes   Intervention Provide education and support for participant on nutrition & aerobic/resistive exercise along with prescribed medications to achieve LDL 70mg , HDL >40mg .   Expected Outcomes Short Term: Participant states understanding of desired cholesterol values and is compliant with medications prescribed. Participant is following exercise prescription and nutrition guidelines.;[place] Term: Cholesterol controlled with medications as prescribed, with individualized exercise RX and with personalized nutrition plan. Value goals: LDL < 70mg , HDL > 40 mg.      Core Components/Risk Factors/Patient Goals Review:    Core Components/Risk Factors/Patient Goals at Discharge (Final Review):    ITP Comments:     ITP Comments    Row Name 12/13/16 0851           ITP Comments Medical Director- Dr. Fransico Him, MD.          Comment Mr Alemany goes by the name [name]. [name] attended orientation 0800 to 1015 to review rules and guidelines for program. Completed 6 minute walk test, Intitial ITP, and exercise prescription.   Telemetry Sinus Rhythm with intermittent PVC's.. [name]'s resting and intermittent systolic blood pressures were in the 140's today. Will notify Truitt Merle NP. [name] said that he experiences clicking whenever he walks since he had his hip surgery. [name] says the orthopedic surgeon is aware.Will continue to monitor the  patient throughout  the program.Karl Erway Venetia Maxon, RN,BSN 12/13/2016 12:22 PM

## 2016-12-13 NOTE — Telephone Encounter (Signed)
Magda Kiel, RN sent to Burtis Junes, NP        Good afternoon [name],   Mr Martire, he goes by [facility], attended orientation this morning. Mr [name]'s blood pressure were as follows   BP 142/70 heart rate 66 entry  BP 142/84 Heart rate 128 immediately post walk test  BP 142/70 heart rate 80   Mr Macquarrie says he was on Amlodipine prior to his surgery. Mr Vittorio says his blood pressure has been running consistently in the 086'P systolic at home.   Mr Felicetti takes Atenolol 50 mg at HS.   Mr Bussiere is to follow up with again in the office in the next few weeks.   I didn't know if you wanted to adjust his BP med at this time but I want to bring it to your attention.   Thanks for your assistance,   Verdis Frederickson     Would add back Norvasc 5 mg a day. Continue to monitor BP.   Burtis Junes, RN, [place] [address] [place], [place]  [address] [phone]

## 2016-12-13 NOTE — Telephone Encounter (Signed)
Left message to call back  

## 2016-12-13 NOTE — Addendum Note (Signed)
Addended by: Thompson Grayer on: 12/13/2016 02:31 PM   Modules accepted: Orders

## 2016-12-13 NOTE — Telephone Encounter (Signed)
I spoke with pt's wife and gave her instructions for pt to start Norvasc 5 mg daily and monitor BP.  She reports pt does not need a new prescription.

## 2016-12-15 NOTE — Progress Notes (Signed)
Hunter Mcbride. 71 y.o. male DOB [birth year] MRN [id number]       Nutrition: Brief Note  1. S/P CABG x 5    Past Medical History:  Diagnosis Date  . Arthritis    "lower back; right shoulder; left thumb; joints" (06/21/2013)  . Asthma    "not sure if this is true or not" (06/21/2013)  . CAD (coronary artery disease) May 2015   s/p PTCA/DES x 3 to mid LAD, mod non-ob disease in Cx and RCA May 2015; s/p repeat caths - last study in November of 2015 - stents patent, - FFR - managed medically.   [name] GERD (gastroesophageal reflux disease)   . Gout    "maybe twice in my life"  . Headache(784.0)    "weekly for the last 3-4 months" (06/21/2013)  . Hyperlipidemia   . Hypertension   . Myocardial infarction [facility])    "/Dr. Katharina Caper I've had one between 2014-2015) (06/21/2013)  . Osteoarthritis   . Sleep apnea    WEIGHT LOSS, NO LONGER NEEDS PER PATIENT  . Type II diabetes mellitus ([place])    TYPE 2   Meds reviewed. Invokana, Metformin noted  HT: Ht Readings from Last 1 Encounters:  12/13/16 5' 8.25" (1.734 m)    WT: Wt Readings from Last 3 Encounters:  12/13/16 190 lb 7.6 oz (86.4 kg)  12/08/16 186 lb (84.4 kg)  11/28/16 188 lb 6.4 oz (85.5 kg)     BMI 28.7   Current tobacco use? No  Labs:  Lipid Panel     Component Value Date/Time   CHOL 84 10/28/2016 0455   TRIG 31 10/28/2016 0455   HDL 36 (L) 10/28/2016 0455   CHOLHDL 2.3 10/28/2016 0455   VLDL 6 10/28/2016 0455   LDLCALC 42 10/28/2016 0455    Lab Results  Component Value Date   HGBA1C 7.5 (H) 11/03/2016   CBG (last 3)  No results for input(s): GLUCAP in the last 72 hours.  Nutrition Diagnosis ? Food-and nutrition-related knowledge deficit related to lack of exposure to information as related to diagnosis of: ? CVD ? DM ? Overweight related to excessive energy intake as evidenced by a BMI of 28.7  Nutrition Goal(s):  ? Improved blood glucose control as evidenced by pt's A1c trending from 7.5 to 7.0 or less.  ? Pt to  identify and limit food sources of saturated fat, trans fat, and sodium  Plan:  Pt to attend nutrition classes ? Nutrition I ? Nutrition II ? Portion Distortion ? Diabetes Blitz ? Diabetes Q & A Will provide client-centered nutrition education as part of interdisciplinary care.   Monitor and evaluate progress toward nutrition goal with team.  Derek Mound, M.Ed, RD, LDN, CDE 12/15/2016 2:02 PM

## 2016-12-19 ENCOUNTER — Encounter (HOSPITAL_COMMUNITY)
Admission: RE | Admit: 2016-12-19 | Discharge: 2016-12-19 | Disposition: A | Discharge status: Home / Self Care | Payer: PPO | Source: Ambulatory Visit | Attending: Cardiovascular Disease | Admitting: Cardiovascular Disease

## 2016-12-19 ENCOUNTER — Telehealth: Payer: Self-pay | Admitting: Nurse Practitioner

## 2016-12-19 DIAGNOSIS — Z951 Presence of aortocoronary bypass graft: Secondary | ICD-10-CM | POA: Insufficient documentation

## 2016-12-19 LAB — GLUCOSE, CAPILLARY
Glucose-Capillary: 214 mg/dL — ABNORMAL HIGH (ref 65–99)
Glucose-Capillary: 155 mg/dL — ABNORMAL HIGH (ref 65–99)

## 2016-12-19 NOTE — Progress Notes (Signed)
Daily Session Note  Patient Details  Name: Hunter Mcbride. MRN: [id number] Date of Birth: 06/18/1945 Referring Provider:     CARDIAC REHAB PHASE II ORIENTATION from 12/13/2016 in [facility]  Referring Provider  Cooper Michael, MD      Encounter Date: 12/19/2016  Check In: Session Check In - 12/19/16 1038      Check-In   Location  [facility]    Staff Present   , RN, BSN;Carlette Carlton, RN, BSN;Olinty Richards, MS, ACSM CEP, Exercise Physiologist    Supervising physician immediately available to respond to emergencies  [facility] immediately available    Physician(s)  Dr. Joseph    Medication changes reported      No    Fall or balance concerns reported     No    Tobacco Cessation  No Change    Warm-up and Cool-down  Performed as group-led instruction    Resistance Training Performed  Yes    VAD Patient?  No      Pain Assessment   Currently in Pain?  No/denies       Capillary Blood Glucose: No results found for this or any previous visit (from the past 24 hour(s)).    Social History   Tobacco Use  Smoking Status Never Smoker  Smokeless Tobacco Never Used    Goals Met:  No report of cardiac concerns or symptoms  Goals Unmet:  BP  Comments: [name] started cardiac rehab today.  Pt tolerated light exercise without difficulty.Initial blood pressure 128/86, telemetry-Sinus Rhythm, asymptomatic.  Medication list reconciled. Pt denies barriers to medicaiton compliance.  PSYCHOSOCIAL ASSESSMENT:  PHQ-0. Pt exhibits positive coping skills, hopeful outlook with supportive family. No psychosocial needs identified at this time, no psychosocial interventions necessary.    Pt enjoys hunting, fishing and spending time with his grandchildren.   Pt oriented to exercise equipment and routine.    Understanding verbalized.  [name]'s blood pressure was noted at 172/80 on the airdyne. Patient was taken off the track and moved  to the walking track. [name] walked the track for about ten minutes. BP 142/70 . [name]'s blood pressure remained in the 140's the remainder of exercise. Will notify Lori Gerhardt NP about today's BP's. Will continue to monitor the patient throughout  the program.[name] is taking his medications as prescribed and is taking his newly added amlodipine.   , RN,BSN 12/19/2016 10:45 AM   Dr. Traci Turner is Medical Director for Cardiac Rehab at [facility]. 

## 2016-12-19 NOTE — Telephone Encounter (Signed)
Magda Kiel, RN sent to Burtis Junes, NP        Good afternoon [name],   Mr Interrante completed his first day of exercise of exercise today.    Mr Hinckley's initial blood pressure was 128/86 HR 80  Airdyne BP 172/80 heart rate 127 Patient taken off the bike and moved to the track.  Track 172/80 heart rate 99. I had Mr Feenstra to sit down for a few minutes with a recheck of 142/70.   I let Mr Montemayor proceed to exercise on the nustep  BP 142/70 heart rate 104   Exit BP 142/78 heart rate 86   Mr Cumberledge says his blood pressure are still running on the "high side" at home.   [name] is taking his med's as prescribed and has added the amlodipine as you ordered.   [name] will be seeing you for follow up in a few weeks for follow up.   [name] said he was taking a BP medication twice a day before the OHS.   Thanks for your assistance,   Sincerely,   Verdis Frederickson       The above was sent to me as a staff message.  Would restart Avapro 150 mg daily.  BMET in one week.  Continue with Norvasc 5 mg a day for now.  Continue to monitor.   Burtis Junes, RN, [place] [address] [place], [place]  [address] [phone]

## 2016-12-20 ENCOUNTER — Other Ambulatory Visit: Payer: Self-pay | Admitting: *Deleted

## 2016-12-20 MED ORDER — IRBESARTAN 150 MG PO TABS: 150.0000 mg | ORAL_TABLET | Freq: Every day | ORAL | 11 refills | Status: DC

## 2016-12-20 NOTE — Telephone Encounter (Signed)
S/w pt's wife per DPR is aware of Lori's recommendation's. Will restart avapro (150 mg) daily.  Medication list updated.  Pt did have this medication at home.  Will keep list of bp's and will bring in for ov appt next week with [name]. Bmet added to pt's appt notes.

## 2016-12-21 ENCOUNTER — Encounter (HOSPITAL_COMMUNITY)
Admission: RE | Admit: 2016-12-21 | Discharge: 2016-12-21 | Disposition: A | Discharge status: Home / Self Care | Payer: PPO | Source: Ambulatory Visit | Attending: Cardiovascular Disease | Admitting: Cardiovascular Disease

## 2016-12-21 DIAGNOSIS — Z951 Presence of aortocoronary bypass graft: Secondary | ICD-10-CM | POA: Diagnosis not present

## 2016-12-21 LAB — GLUCOSE, CAPILLARY
Glucose-Capillary: 134 mg/dL — ABNORMAL HIGH (ref 65–99)
Glucose-Capillary: 93 mg/dL (ref 65–99)

## 2016-12-23 ENCOUNTER — Encounter (HOSPITAL_COMMUNITY)
Admission: RE | Admit: 2016-12-23 | Discharge: 2016-12-23 | Disposition: A | Discharge status: Home / Self Care | Payer: PPO | Source: Ambulatory Visit | Attending: Cardiovascular Disease | Admitting: Cardiovascular Disease

## 2016-12-23 DIAGNOSIS — Z951 Presence of aortocoronary bypass graft: Secondary | ICD-10-CM

## 2016-12-23 LAB — GLUCOSE, CAPILLARY: GLUCOSE-CAPILLARY: 238 mg/dL — AB (ref 65–99)

## 2016-12-26 ENCOUNTER — Encounter: Payer: Self-pay | Admitting: Nurse Practitioner

## 2016-12-26 ENCOUNTER — Ambulatory Visit: Payer: PPO | Admitting: Nurse Practitioner

## 2016-12-26 VITALS — BP 160/80 | HR 70 | Ht 69.0 in | Wt 189.4 lb

## 2016-12-26 DIAGNOSIS — I10 Essential (primary) hypertension: Secondary | ICD-10-CM | POA: Diagnosis not present

## 2016-12-26 DIAGNOSIS — I259 Chronic ischemic heart disease, unspecified: Secondary | ICD-10-CM | POA: Diagnosis not present

## 2016-12-26 DIAGNOSIS — Z951 Presence of aortocoronary bypass graft: Secondary | ICD-10-CM | POA: Diagnosis not present

## 2016-12-26 DIAGNOSIS — E785 Hyperlipidemia, unspecified: Secondary | ICD-10-CM | POA: Diagnosis not present

## 2016-12-26 NOTE — Progress Notes (Signed)
CARDIOLOGY OFFICE NOTE  Date:  12/26/2016    Hunter Mcbride. Date of Birth: [birth year] Medical Record #[id number]  PCP:  Hunter Infante, MD  Cardiologist:  Hunter Mcbride & Hunter Mcbride   Chief Complaint  Patient presents with  . Coronary Artery Disease    1 month check - seen for Dr. Maryruth Mcbride    History of Present Illness: Hunter Mcbride. is a 71 y.o. male who presents today for a follow up visit. Seen for Dr. Maryruth Mcbride.   He has a history of CAD, HTN, HLD, DM and OSA. He has had prior DES x 3 to the LAD back in 2015. Several caths since. Intolerant to higher doses of nitrates despite multiple attempts. Has not wanted to increase Ranexa due to cost. Has had chronic tendency to really overexert himself. It is surprising how very well his exercise tolerance is and that his angina is typically always at rest - this is chronic. He has had prior echo showing mild dilatation of the aorta with repeat studies not identified.   He was cathed back in April of 2018 after having prolonged episodes of chest pain - had angioplasty at that time to a severe stenosis in the proximal PDA that was small to moderate caliber. Had DES x 1 to the proximal posterolateral artery. On return visit back in May still having some symptoms - discussed with Dr. Burt Mcbride and planned to have pressure wire analysis of all the coronaries.   He got admitted back at the end of September - again having angina - again after exertion - his typical presentation which has chronically seemed atypical for angina - referred on for CABG x 5 - this was done by EBG - uneventful post op course noted.   I then saw him back about a month ago - typical post op course to which he was having trouble adjusting/accepting - seemed to be overdoing things in my opinion. Had had some chest pain - had actually used a NTG. Now in cardiac rehab. BP has been up and we have added back medicines for his BP.   Comes in today. Here with  his wife Hunter Mcbride. Forgot the Avapro for the past 2 days - BP is up here today - just got back on this morning. Says he is "getting better". Less aches and pains. Had more issues with his hip this past weekend but walked 3 miles in the woods in the cold/wind. Had actually been out all day since 4:30am. Took tramadol and tylenol for chest pain that happened later. He has felt his sternal wire "flip" a couple of times - in the setting of coughing/pressing on the end of his incision. He is actually enjoying rehab. Overall, seems to be doing ok.   Past Medical History:  Diagnosis Date  . Arthritis    "lower back; right shoulder; left thumb; joints" (06/21/2013)  . Asthma    "not sure if this is true or not" (06/21/2013)  . CAD (coronary artery disease) May 2015   s/p PTCA/DES x 3 to mid LAD, mod non-ob disease in Cx and RCA May 2015; s/p repeat caths - last study in November of 2015 - stents patent, - FFR - managed medically.   [name] GERD (gastroesophageal reflux disease)   . Gout    "maybe twice in my life"  . Headache(784.0)    "weekly for the last 3-4 months" (06/21/2013)  . Hyperlipidemia   . Hypertension   . Myocardial infarction (  [place])    "/Dr. Katharina Mcbride I've had one between 2014-2015) (06/21/2013)  . Osteoarthritis   . Sleep apnea    WEIGHT LOSS, NO LONGER NEEDS PER PATIENT  . Type II diabetes mellitus ([place])    TYPE 2    Past Surgical History:  Procedure Laterality Date  . CARDIAC CATHETERIZATION  1980's   "once"  . CARDIOVASCULAR STRESS TEST  06/10/2008   EF 68%  . CARPAL TUNNEL RELEASE Bilateral   . CORONARY ANGIOPLASTY WITH STENT PLACEMENT  06/21/2013   "3"  . EXCISION MORTON'S NEUROMA Left   . EYE SURGERY Left    "removed film over my eye"  . HERNIA REPAIR Left   . JOINT REPLACEMENT Left 03/2013   "thumb"  . TONSILLECTOMY    . UMBILICAL HERNIA REPAIR       Medications: Current Meds  Medication Sig  . acetaminophen (TYLENOL) 500 MG tablet Take 1,000 mg by mouth every 8 (eight)  hours as needed (pain).   [name] ALPRAZolam (XANAX) 0.5 MG tablet Take 0.5 tablets (0.25 mg total) by mouth daily as needed for anxiety.  [name] amLODipine (NORVASC) 5 MG tablet Take 1 tablet (5 mg total) by mouth daily.  [name] aspirin 81 MG tablet Take 81 mg by mouth at bedtime.   [name] atenolol (TENORMIN) 50 MG tablet Take 1 tablet (50 mg total) by mouth at bedtime.  [name] atorvastatin (LIPITOR) 80 MG tablet Take 80 mg by mouth daily at 6 PM.  . canagliflozin (INVOKANA) 300 MG TABS tablet Take 300 mg by mouth daily before breakfast.  . Cholecalciferol (VITAMIN D PO) Take 1 capsule by mouth daily.  . clopidogrel (PLAVIX) 75 MG tablet Take 1 tablet (75 mg total) by mouth daily with breakfast.  . docusate sodium (COLACE) 100 MG capsule Take 100 mg by mouth daily as needed for mild constipation.  . gabapentin (NEURONTIN) 300 MG capsule Take 300 mg by mouth at bedtime.   . irbesartan (AVAPRO) 150 MG tablet Take 1 tablet (150 mg total) daily by mouth.  . metFORMIN (FORTAMET) 1000 MG (OSM) 24 hr tablet Take 1 tablet (1,000 mg total) by mouth 2 (two) times daily with a meal.  . pantoprazole (PROTONIX) 40 MG tablet Take 40 mg by mouth 2 (two) times daily.  . polyethylene glycol (MIRALAX / GLYCOLAX) packet Take 17 g by mouth daily as needed for mild constipation.  . traMADol (ULTRAM) 50 MG tablet Take 1 tablet (50 mg total) by mouth every 6 (six) hours as needed for moderate pain.     Allergies: Allergies  Allergen Reactions  . Oxycodone Nausea Only    Social History: The patient  reports that  has never smoked. he has never used smokeless tobacco. He reports that he does not drink alcohol or use drugs.   Family History: The patient's family history includes Coronary artery disease in his cousin; Diabetes type II in his mother; Heart attack in his father; Heart disease in his brother.   Review of Systems: Please see the history of present illness.   Otherwise, the review of systems is positive for none.   All  other systems are reviewed and negative.   Physical Exam: VS:  BP (!) 160/80 (BP Location: Left Arm, Patient Position: Sitting, Cuff Size: Normal)   Pulse 70   Ht 5\' 9"  (1.753 m)   Wt 189 lb 6.4 oz (85.9 kg)   BMI 27.97 kg/m  .  BMI Body mass index is 27.97 kg/m.  Wt Readings from Last  3 Encounters:  12/26/16 189 lb 6.4 oz (85.9 kg)  12/13/16 190 lb 7.6 oz (86.4 kg)  12/08/16 186 lb (84.4 kg)    General: Pleasant. Well developed, well nourished and in no acute distress.   HEENT: Normal.  Neck: Supple, no JVD, carotid bruits, or masses noted.  Cardiac: Regular rate and rhythm. No murmurs, rubs, or gallops. No edema.  Respiratory:  Lungs are clear to auscultation bilaterally with normal work of breathing.  GI: Soft and nontender.  MS: No deformity or atrophy. Gait and ROM intact.  Skin: Warm and dry. Color is normal.  Neuro:  Strength and sensation are intact and no gross focal deficits noted.  Psych: Alert, appropriate and with normal affect.   LABORATORY DATA:  EKG:  EKG is not ordered today.  Lab Results  Component Value Date   WBC 4.1 11/28/2016   HGB 10.5 (L) 11/28/2016   HCT 31.7 (L) 11/28/2016   PLT 240 11/28/2016   GLUCOSE 146 (H) 11/28/2016   CHOL 84 10/28/2016   TRIG 31 10/28/2016   HDL 36 (L) 10/28/2016   LDLCALC 42 10/28/2016   ALT 44 11/03/2016   AST 27 11/03/2016   NA 142 11/28/2016   K 5.3 (H) 11/28/2016   CL 105 11/28/2016   CREATININE 0.82 11/28/2016   BUN 13 11/28/2016   CO2 22 11/28/2016   TSH 2.009 06/06/2016   INR 1.51 11/07/2016   HGBA1C 7.5 (H) 11/03/2016     BNP (last 3 results) Recent Labs    06/06/16 1818  BNP 54.3    ProBNP (last 3 results) No results for input(s): PROBNP in the last 8760 hours.   Other Studies Reviewed Today:  SURGICAL PROCEDURE 10/2016:  Coronary artery bypass grafting x5 with the left internal mammary to the distal left anterior descending coronary artery, reverse saphenous vein graft to the  diagonal, reverse saphenous vein graft to the first obtuse marginal, sequential reverse saphenous vein graft to the proximal posterior lateral branch and to the distal posterior descending with right leg, thigh, and calf greater saphenous vein harvesting endoscopically.  Assessment/Plan:  1. CAD with history of chronic angina - prior DES x 3 to the LAD, he has had PCI with DES x 1 to the proximal PLA and balloon PCI to the proximal PDA - now s/p CABG x 5 - doing well. Making good progress. Still with tendency to overdo - this is chronic for him. Sternum looks ok but if this "flipping" of a wire persists - will get him back to see EBG.   2. HTN - back on Norvasc and Avapro - just at lower doses - missed last 2 days of ARB - monitor for now.   3. HLD - on statin therapy  4. DM - followed by Dr. Joylene Draft.  5. Hip pain - asking about timing for repeat surgery - would hold for now.   Current medicines are reviewed with the patient today.  The patient does not have concerns regarding medicines other than what has been noted above.  The following changes have been made:  See above.  Labs/ tests ordered today include:   No orders of the defined types were placed in this encounter.    Disposition:   FU with Dr. Burt Mcbride in about 2 to 3 months.   Patient is agreeable to this plan and will call if any problems develop in the interim.   SignedTruitt Merle, NP  12/26/2016 10:39 AM  [facility]  [address] [place], [place]  [address] Phone: [phone] Fax: [phone]

## 2016-12-26 NOTE — Patient Instructions (Addendum)
We will be checking the following labs today - NONE   Medication Instructions:    Continue with your current medicines.     Testing/Procedures To Be Arranged:  N/A  Follow-Up:   See Dr. Burt Knack in 2 to 3 months.     Other Special Instructions:   N/A    If you need a refill on your cardiac medications before your next appointment, please call your pharmacy.   Call the [place] office at [phone] if you have any questions, problems or concerns.

## 2016-12-28 ENCOUNTER — Encounter (HOSPITAL_COMMUNITY)
Admission: RE | Admit: 2016-12-28 | Discharge: 2016-12-28 | Disposition: A | Discharge status: Home / Self Care | Payer: PPO | Source: Ambulatory Visit | Attending: Cardiovascular Disease | Admitting: Cardiovascular Disease

## 2016-12-28 DIAGNOSIS — Z951 Presence of aortocoronary bypass graft: Secondary | ICD-10-CM

## 2016-12-30 ENCOUNTER — Encounter (HOSPITAL_COMMUNITY)
Admission: RE | Admit: 2016-12-30 | Discharge: 2016-12-30 | Disposition: A | Discharge status: Home / Self Care | Payer: PPO | Source: Ambulatory Visit | Attending: Cardiovascular Disease | Admitting: Cardiovascular Disease

## 2016-12-30 DIAGNOSIS — Z951 Presence of aortocoronary bypass graft: Secondary | ICD-10-CM

## 2017-01-02 ENCOUNTER — Encounter (HOSPITAL_COMMUNITY)
Admission: RE | Admit: 2017-01-02 | Discharge: 2017-01-02 | Disposition: A | Discharge status: Home / Self Care | Payer: PPO | Source: Ambulatory Visit | Attending: Cardiovascular Disease | Admitting: Cardiovascular Disease

## 2017-01-02 DIAGNOSIS — Z951 Presence of aortocoronary bypass graft: Secondary | ICD-10-CM

## 2017-01-02 NOTE — Progress Notes (Signed)
Reviewed home exercise guidelines with patient including endpoints, temperature precautions, target heart rate and rate of perceived exertion. Pt is walking daily as his mode of home exercise. Pt voices understanding of instructions given. Sol Passer, MS, ACSM CEP

## 2017-01-04 ENCOUNTER — Encounter (HOSPITAL_COMMUNITY): Payer: PPO

## 2017-01-09 ENCOUNTER — Encounter (HOSPITAL_COMMUNITY)
Admission: RE | Admit: 2017-01-09 | Discharge: 2017-01-09 | Disposition: A | Discharge status: Home / Self Care | Payer: PPO | Source: Ambulatory Visit | Attending: Cardiovascular Disease | Admitting: Cardiovascular Disease

## 2017-01-09 DIAGNOSIS — Z951 Presence of aortocoronary bypass graft: Secondary | ICD-10-CM

## 2017-01-10 DIAGNOSIS — Z6827 Body mass index (BMI) 27.0-27.9, adult: Secondary | ICD-10-CM | POA: Diagnosis not present

## 2017-01-10 DIAGNOSIS — I10 Essential (primary) hypertension: Secondary | ICD-10-CM | POA: Diagnosis not present

## 2017-01-10 DIAGNOSIS — J40 Bronchitis, not specified as acute or chronic: Secondary | ICD-10-CM | POA: Diagnosis not present

## 2017-01-10 DIAGNOSIS — I2510 Atherosclerotic heart disease of native coronary artery without angina pectoris: Secondary | ICD-10-CM | POA: Diagnosis not present

## 2017-01-10 DIAGNOSIS — E7849 Other hyperlipidemia: Secondary | ICD-10-CM | POA: Diagnosis not present

## 2017-01-10 DIAGNOSIS — E119 Type 2 diabetes mellitus without complications: Secondary | ICD-10-CM | POA: Diagnosis not present

## 2017-01-11 ENCOUNTER — Encounter (HOSPITAL_COMMUNITY): Payer: PPO

## 2017-01-13 ENCOUNTER — Encounter (HOSPITAL_COMMUNITY)
Admission: RE | Admit: 2017-01-13 | Discharge: 2017-01-13 | Disposition: A | Discharge status: Home / Self Care | Payer: PPO | Source: Ambulatory Visit | Attending: Cardiovascular Disease | Admitting: Cardiovascular Disease

## 2017-01-13 DIAGNOSIS — Z951 Presence of aortocoronary bypass graft: Secondary | ICD-10-CM | POA: Diagnosis not present

## 2017-01-13 NOTE — Progress Notes (Signed)
Cardiac Individual Treatment Plan  Patient Details  Name: Hunter Mcbride. MRN: [id number] Date of Birth: 1945-02-28 Referring Provider:     CARDIAC REHAB PHASE II ORIENTATION from 12/13/2016 in [place]  Referring Provider  Sherren Mocha, MD      Initial Encounter Date:    CARDIAC REHAB PHASE II ORIENTATION from 12/13/2016 in [place]  Date  12/13/16  Referring Provider  Sherren Mocha, MD      Visit Diagnosis: S/P CABG x 5  Patient's Home Medications on Admission:  Current Outpatient Medications:  .  acetaminophen (TYLENOL) 500 MG tablet, Take 1,000 mg by mouth every 8 (eight) hours as needed (pain). , Disp: , Rfl:  .  ALPRAZolam (XANAX) 0.5 MG tablet, Take 0.5 tablets (0.25 mg total) by mouth daily as needed for anxiety., Disp: , Rfl: 0 .  amLODipine (NORVASC) 5 MG tablet, Take 1 tablet (5 mg total) by mouth daily., Disp: 30 tablet, Rfl: 3 .  aspirin 81 MG tablet, Take 81 mg by mouth at bedtime. , Disp: , Rfl:  .  atenolol (TENORMIN) 50 MG tablet, Take 1 tablet (50 mg total) by mouth at bedtime., Disp: 90 tablet, Rfl: 3 .  atorvastatin (LIPITOR) 80 MG tablet, Take 80 mg by mouth daily at 6 PM., Disp: , Rfl:  .  canagliflozin (INVOKANA) 300 MG TABS tablet, Take 300 mg by mouth daily before breakfast., Disp: , Rfl:  .  Cholecalciferol (VITAMIN D PO), Take 1 capsule by mouth daily., Disp: , Rfl:  .  clopidogrel (PLAVIX) 75 MG tablet, Take 1 tablet (75 mg total) by mouth daily with breakfast., Disp: 90 tablet, Rfl: 3 .  docusate sodium (COLACE) 100 MG capsule, Take 100 mg by mouth daily as needed for mild constipation., Disp: , Rfl:  .  gabapentin (NEURONTIN) 300 MG capsule, Take 300 mg by mouth at bedtime. , Disp: , Rfl:  .  irbesartan (AVAPRO) 150 MG tablet, Take 1 tablet (150 mg total) daily by mouth., Disp: 30 tablet, Rfl: 11 .  metFORMIN (FORTAMET) 1000 MG (OSM) 24 hr tablet, Take 1 tablet (1,000 mg total) by  mouth 2 (two) times daily with a meal., Disp: 60 tablet, Rfl: 1 .  pantoprazole (PROTONIX) 40 MG tablet, Take 40 mg by mouth 2 (two) times daily., Disp: , Rfl:  .  polyethylene glycol (MIRALAX / GLYCOLAX) packet, Take 17 g by mouth daily as needed for mild constipation., Disp: , Rfl:  .  traMADol (ULTRAM) 50 MG tablet, Take 1 tablet (50 mg total) by mouth every 6 (six) hours as needed for moderate pain., Disp: 30 tablet, Rfl: 0  Past Medical History: Past Medical History:  Diagnosis Date  . Arthritis    "lower back; right shoulder; left thumb; joints" (06/21/2013)  . Asthma    "not sure if this is true or not" (06/21/2013)  . CAD (coronary artery disease) May 2015   s/p PTCA/DES x 3 to mid LAD, mod non-ob disease in Cx and RCA May 2015; s/p repeat caths - last study in November of 2015 - stents patent, - FFR - managed medically.   [name] GERD (gastroesophageal reflux disease)   . Gout    "maybe twice in my life"  . Headache(784.0)    "weekly for the last 3-4 months" (06/21/2013)  . Hyperlipidemia   . Hypertension   . Myocardial infarction [facility])    "/Dr. Katharina Caper I've had one between 2014-2015) (06/21/2013)  . Osteoarthritis   .  Sleep apnea    WEIGHT LOSS, NO LONGER NEEDS PER PATIENT  . Type II diabetes mellitus (HCC)    TYPE 2    Tobacco Use: Social History   Tobacco Use  Smoking Status Never Smoker  Smokeless Tobacco Never Used    Labs: Recent Review Flowsheet Data    Labs for ITP Cardiac and Pulmonary Rehab Latest Ref Rng & Units 11/07/2016 11/07/2016 11/07/2016 11/07/2016 11/08/2016   Cholestrol 0 - 200 mg/dL - - - - -   LDLCALC 0 - 99 mg/dL - - - - -   HDL >40 mg/dL - - - - -   Trlycerides <150 mg/dL - - - - -   Hemoglobin A1c 4.8 - 5.6 % - - - - -   PHART 7.350 - 7.450 7.450 7.284(L) 7.290(L) - -   PCO2ART 32.0 - 48.0 mmHg 33.8 47.1 47.4 - -   HCO3 20.0 - 28.0 mmol/L 23.8 22.2 22.5 - -   TCO2 22 - 32 mmol/L 25 24 24 23 25    ACIDBASEDEF 0.0 - 2.0 mmol/L - 4.0(H) 4.0(H) - -   O2SAT  % 99.0 95.0 96.0 - -      Capillary Blood Glucose: Lab Results  Component Value Date   GLUCAP 238 (H) 12/23/2016   GLUCAP 93 12/21/2016   GLUCAP 134 (H) 12/21/2016   GLUCAP 155 (H) 12/19/2016   GLUCAP 214 (H) 12/19/2016     Exercise Target Goals:    Exercise Program Goal: Individual exercise prescription set with THRR, safety & activity barriers. Participant demonstrates ability to understand and report RPE using BORG scale, to self-measure pulse accurately, and to acknowledge the importance of the exercise prescription.  Exercise Prescription Goal: Starting with aerobic activity 30 plus minutes a day, 3 days per week for initial exercise prescription. Provide home exercise prescription and guidelines that participant acknowledges understanding prior to discharge.  Activity Barriers & Risk Stratification: Activity Barriers & Cardiac Risk Stratification - 12/13/16 0943      Activity Barriers & Cardiac Risk Stratification   Activity Barriers  Right Hip Replacement;Deconditioning;Muscular Weakness;Joint Problems;Other (comment)    Comments  R shoulde replacement    Cardiac Risk Stratification  High       6 Minute Walk: 6 Minute Walk    Row Name 12/13/16 1023         6 Minute Walk   Phase  Initial     Distance  1793 feet     Walk Time  6 minutes     # of Rest Breaks  0     MPH  3.4     METS  4.1     RPE  11     VO2 Peak  14.3     Symptoms  Yes (comment)     Comments  R hip pain, "clicking"     Resting HR  66 bpm     Resting BP  142/70     Resting Oxygen Saturation   100 %     Exercise Oxygen Saturation  during 6 min walk  99 %     Max Ex. HR  128 bpm     Max Ex. BP  142/84     2 Minute Post BP  142/70        Oxygen Initial Assessment:   Oxygen Re-Evaluation:   Oxygen Discharge (Final Oxygen Re-Evaluation):   Initial Exercise Prescription: Initial Exercise Prescription - 12/13/16 1000      Date of Initial Exercise RX and  Referring Provider   Date   12/13/16    Referring Provider  Sherren Mocha, MD      Treadmill   MPH  2.8    Grade  1    Minutes  10    METs  3.53      Bike   Level  1    Minutes  10    METs  3.22      NuStep   Level  3    SPM  80    Minutes  10    METs  3      Prescription Details   Frequency (times per week)  3    Duration  Progress to 30 minutes of continuous aerobic without signs/symptoms of physical distress      Intensity   THRR 40-80% of Max Heartrate  60-120    Ratings of Perceived Exertion  11-13    Perceived Dyspnea  0-4      Progression   Progression  Continue to progress workloads to maintain intensity without signs/symptoms of physical distress.      Resistance Training   Training Prescription  Yes    Weight  3lbs    Reps  10-15       Perform Capillary Blood Glucose checks as needed.  Exercise Prescription Changes: Exercise Prescription Changes    Row Name 01/02/17 [phone] 01/09/17 0951           Response to Exercise   Blood Pressure (Admit)  130/70  144/78      Blood Pressure (Exercise)  142/60  180/80      Blood Pressure (Exit)  134/82  118/78      Heart Rate (Admit)  72 bpm  73 bpm      Heart Rate (Exercise)  113 bpm  122 bpm      Heart Rate (Exit)  72 bpm  83 bpm      Rating of Perceived Exertion (Exercise)  12  12      Symptoms  none  none      Duration  Continue with 30 min of aerobic exercise without signs/symptoms of physical distress.  Continue with 30 min of aerobic exercise without signs/symptoms of physical distress.      Intensity  THRR unchanged  THRR unchanged        Progression   Progression  Continue to progress workloads to maintain intensity without signs/symptoms of physical distress.  Continue to progress workloads to maintain intensity without signs/symptoms of physical distress.      Average METs  3.4  3.6        Resistance Training   Training Prescription  Yes  Yes      Weight  5lbs  5lbs      Reps  10-15  10-15      Time  10 Minutes  10  Minutes        Interval Training   Interval Training  No  No        Treadmill   MPH  -  -      Grade  -  -      Minutes  -  -      METs  -  -        Bike   Level  1.6  1.6      Minutes  10  10      METs  4.55  4.53        NuStep   Level  3  3      SPM  80  80      Minutes  10  10      METs  3.2  3.4        Track   Laps  8  23      Minutes  10  20      METs  2.39  3        Home Exercise Plan   Plans to continue exercise at  Home (comment)  Home (comment)      Frequency  Add 4 additional days to program exercise sessions.  Add 4 additional days to program exercise sessions.      Initial Home Exercises Provided  12/28/16  12/28/16         Exercise Comments: Exercise Comments    Row Name 12/28/16 1028 01/02/17 1032 01/09/17 0951       Exercise Comments  Reviewed home exercise guidelines with patient.  Reviewed goals with patient.  Reviewed METs with patient.        Exercise Goals and Review: Exercise Goals    Row Name 12/13/16 [phone]             Exercise Goals   Increase Physical Activity  Yes       Intervention  Provide advice, education, support and counseling about physical activity/exercise needs.;Develop an individualized exercise prescription for aerobic and resistive training based on initial evaluation findings, risk stratification, comorbidities and participant's personal goals.       Expected Outcomes  Achievement of increased cardiorespiratory fitness and enhanced flexibility, muscular endurance and strength shown through measurements of functional capacity and personal statement of participant.       Increase Strength and Stamina  Yes  return to fishing and hunting. [place] term:  be able to climb trees, pull a bow & arrow and pull tiller and chain saw        Intervention  Provide advice, education, support and counseling about physical activity/exercise needs.;Develop an individualized exercise prescription for aerobic and resistive training based on  initial evaluation findings, risk stratification, comorbidities and participant's personal goals.       Expected Outcomes  Achievement of increased cardiorespiratory fitness and enhanced flexibility, muscular endurance and strength shown through measurements of functional capacity and personal statement of participant.       Able to understand and use rate of perceived exertion (RPE) scale  Yes       Intervention  Provide education and explanation on how to use RPE scale       Expected Outcomes  Short Term: Able to use RPE daily in rehab to express subjective intensity level;[place] Term:  Able to use RPE to guide intensity level when exercising independently       Knowledge and understanding of Target Heart Rate Range (THRR)  Yes       Intervention  Provide education and explanation of THRR including how the numbers were predicted and where they are located for reference       Expected Outcomes  Short Term: Able to state/look up THRR;[place] Term: Able to use THRR to govern intensity when exercising independently;Short Term: Able to use daily as guideline for intensity in rehab       Able to check pulse independently  Yes       Intervention  Provide education and demonstration on how to check pulse in carotid and radial arteries.;Review the importance of being able to check your own pulse for safety  during independent exercise       Expected Outcomes  Short Term: Able to explain why pulse checking is important during independent exercise;[place] Term: Able to check pulse independently and accurately       Understanding of Exercise Prescription  Yes       Intervention  Provide education, explanation, and written materials on patient's individual exercise prescription       Expected Outcomes  Short Term: Able to explain program exercise prescription;[place] Term: Able to explain home exercise prescription to exercise independently          Exercise Goals Re-Evaluation : Exercise Goals Re-Evaluation    Row Name  12/28/16 1028 01/02/17 1032           Exercise Goal Re-Evaluation   Exercise Goals Review  Understanding of Exercise Prescription;Knowledge and understanding of Target Heart Rate Range (THRR);Able to understand and use rate of perceived exertion (RPE) scale  Increase Strength and Stamina      Comments  Reviewed home exercise guidelines with patient including THRR, RPE scale and endpoints for exercise. Pt states that he walks a lot when hunting  Patient wants to increase strength especially upper body strength.       Expected Outcomes  Patient plans to walk daily for his mode of home exercise.  Patient wil increase hand weight to help achieve personal fitness goals.          Discharge Exercise Prescription (Final Exercise Prescription Changes): Exercise Prescription Changes - 01/09/17 0951      Response to Exercise   Blood Pressure (Admit)  144/78    Blood Pressure (Exercise)  180/80    Blood Pressure (Exit)  118/78    Heart Rate (Admit)  73 bpm    Heart Rate (Exercise)  122 bpm    Heart Rate (Exit)  83 bpm    Rating of Perceived Exertion (Exercise)  12    Symptoms  none    Duration  Continue with 30 min of aerobic exercise without signs/symptoms of physical distress.    Intensity  THRR unchanged      Progression   Progression  Continue to progress workloads to maintain intensity without signs/symptoms of physical distress.    Average METs  3.6      Resistance Training   Training Prescription  Yes    Weight  5lbs    Reps  10-15    Time  10 Minutes      Interval Training   Interval Training  No      Bike   Level  1.6    Minutes  10    METs  4.53      NuStep   Level  3    SPM  80    Minutes  10    METs  3.4      Track   Laps  23    Minutes  20    METs  3      Home Exercise Plan   Plans to continue exercise at  Home (comment)    Frequency  Add 4 additional days to program exercise sessions.    Initial Home Exercises Provided  12/28/16       Nutrition:   Target Goals: Understanding of nutrition guidelines, daily intake of sodium 1500mg , cholesterol 200mg , calories 30% from fat and 7% or less from saturated fats, daily to have 5 or more servings of fruits and vegetables.  Biometrics: Pre Biometrics - 12/13/16 1205  Pre Biometrics   Height  5' 8.25" (1.734 m)    Weight  190 lb 7.6 oz (86.4 kg)    Waist Circumference  37.75 inches    Hip Circumference  37.75 inches    Waist to Hip Ratio  1 %    BMI (Calculated)  28.74    Triceps Skinfold  20 mm    % Body Fat  28.1 %    Grip Strength  41 kg    Flexibility  11.75 in    Single Leg Stand  30 seconds        Nutrition Therapy Plan and Nutrition Goals: Nutrition Therapy & Goals - 12/15/16 1402      Nutrition Therapy   Diet  Carb Modified, Therapeutic Lifestyle Changes      Personal Nutrition Goals   Nutrition Goal  Pt to identify and limit food sources of saturated fat, trans fat, and sodium    Personal Goal #2  Improved blood glucose control as evidenced by pt's A1c trending from 7.5 to 7.0 or less.       Intervention Plan   Intervention  Prescribe, educate and counsel regarding individualized specific dietary modifications aiming towards targeted core components such as weight, hypertension, lipid management, diabetes, heart failure and other comorbidities.    Expected Outcomes  Short Term Goal: Understand basic principles of dietary content, such as calories, fat, sodium, cholesterol and nutrients.;[place] Term Goal: Adherence to prescribed nutrition plan.       Nutrition Discharge: Nutrition Scores: Nutrition Assessments - 01/13/17 1132      MEDFICTS Scores   Pre Score  71       Nutrition Goals Re-Evaluation:   Nutrition Goals Re-Evaluation:   Nutrition Goals Discharge (Final Nutrition Goals Re-Evaluation):   Psychosocial: Target Goals: Acknowledge presence or absence of significant depression and/or stress, maximize coping skills, provide positive support  system. Participant is able to verbalize types and ability to use techniques and skills needed for reducing stress and depression.  Initial Review & Psychosocial Screening: Initial Psych Review & Screening - 12/13/16 1027      Initial Review   Current issues with  None Identified      Family Dynamics   Good Support System?  Yes spouse, children, family      Barriers   Psychosocial barriers to participate in program  There are no identifiable barriers or psychosocial needs.      Screening Interventions   Interventions  To provide support and resources with identified psychosocial needs;Encouraged to exercise       Quality of Life Scores: Quality of Life - 12/13/16 1024      Quality of Life Scores   Health/Function Pre  23.6 %    Socioeconomic Pre  30 %    Psych/Spiritual Pre  29.14 %    Family Pre  28.8 %    GLOBAL Pre  26.91 %       PHQ-9: Recent Review Flowsheet Data    Depression screen [facility] 2/9 12/19/2016   Decreased Interest 0   Down, Depressed, Hopeless 0   PHQ - 2 Score 0     Interpretation of Total Score  Total Score Depression Severity:  1-4 = Minimal depression, 5-9 = Mild depression, 10-14 = Moderate depression, 15-19 = Moderately severe depression, 20-27 = Severe depression   Psychosocial Evaluation and Intervention:   Psychosocial Re-Evaluation: Psychosocial Re-Evaluation    [place] Name 01/13/17 1715             Psychosocial  Re-Evaluation   Current issues with  None Identified       Interventions  Encouraged to attend Cardiac Rehabilitation for the exercise       Continue Psychosocial Services   No Follow up required          Psychosocial Discharge (Final Psychosocial Re-Evaluation): Psychosocial Re-Evaluation - 01/13/17 1715      Psychosocial Re-Evaluation   Current issues with  None Identified    Interventions  Encouraged to attend Cardiac Rehabilitation for the exercise    Continue Psychosocial Services   No Follow up required        Vocational Rehabilitation: Provide vocational rehab assistance to qualifying candidates.   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 Participant will state understanding/return demonstration of topics presented.  Learning Barriers/Preferences: Learning Barriers/Preferences - 12/13/16 0943      Learning Barriers/Preferences   Learning Barriers  Sight readers    Learning Preferences  Video;Written Material;Pictoral;Computer/Internet;Skilled Demonstration       Education Topics: Count Your Pulse:  -Group instruction provided by verbal instruction, demonstration, patient participation and written materials to support subject.  Instructors address importance of being able to find your pulse and how to count your pulse when at home without a heart monitor.  Patients get hands on experience counting their pulse with staff help and individually.   Heart Attack, Angina, and Risk Factor Modification:  -Group instruction provided by verbal instruction, video, and written materials to support subject.  Instructors address signs and symptoms of angina and heart attacks.    Also discuss risk factors for heart disease and how to make changes to improve heart health risk factors.   Functional Fitness:  -Group instruction provided by verbal instruction, demonstration, patient participation, and written materials to support subject.  Instructors address safety measures for doing things around the house.  Discuss how to get up and down off the floor, how to pick things up properly, how to safely get out of a chair without assistance, and balance training.   Meditation and Mindfulness:  -Group instruction  provided by verbal instruction, patient participation, and written materials to support subject.  Instructor addresses importance of mindfulness and meditation practice to help reduce stress and improve awareness.  Instructor also leads participants through a meditation exercise.    Stretching for Flexibility and Mobility:  -Group instruction provided by verbal instruction, patient participation, and written materials to support subject.  Instructors lead participants through series of stretches that are designed to increase flexibility thus improving mobility.  These stretches are additional exercise for major muscle groups that are typically performed during regular warm up and cool down.   Hands Only CPR:  -Group verbal, video, and participation provides a basic overview of AHA guidelines for community CPR. Role-play of emergencies allow participants the opportunity to practice calling for help and chest compression technique with discussion of AED use.   Hypertension: -Group verbal and written instruction that provides a basic overview of hypertension including the most recent diagnostic guidelines, risk factor reduction with self-care instructions and medication management.    Nutrition I class: Heart Healthy Eating:  -Group instruction provided by PowerPoint slides, verbal discussion, and written materials to support subject matter. The instructor gives an  explanation and review of the Therapeutic Lifestyle Changes diet recommendations, which includes a discussion on lipid goals, dietary fat, sodium, fiber, plant stanol/sterol esters, sugar, and the components of a well-balanced, healthy diet.   CARDIAC REHAB PHASE II EXERCISE from 01/13/2017 in [place]  Date  01/13/17  Educator  RD  Instruction Review Code  Not applicable      Nutrition II class: Lifestyle Skills:  -Group instruction provided by PowerPoint slides, verbal discussion, and written  materials to support subject matter. The instructor gives an explanation and review of label reading, grocery shopping for heart health, heart healthy recipe modifications, and ways to make healthier choices when eating out.   CARDIAC REHAB PHASE II EXERCISE from 01/13/2017 in [place]  Date  01/13/17  Educator  RD  Instruction Review Code  Not applicable      Diabetes Question & Answer:  -Group instruction provided by PowerPoint slides, verbal discussion, and written materials to support subject matter. The instructor gives an explanation and review of diabetes co-morbidities, pre- and post-prandial blood glucose goals, pre-exercise blood glucose goals, signs, symptoms, and treatment of hypoglycemia and hyperglycemia, and foot care basics.   Diabetes Blitz:  -Group instruction provided by PowerPoint slides, verbal discussion, and written materials to support subject matter. The instructor gives an explanation and review of the physiology behind type 1 and type 2 diabetes, diabetes medications and rational behind using different medications, pre- and post-prandial blood glucose recommendations and Hemoglobin A1c goals, diabetes diet, and exercise including blood glucose guidelines for exercising safely.    CARDIAC REHAB PHASE II EXERCISE from 01/13/2017 in [place]  Date  01/13/17  Educator  RD  Instruction Review Code  Not applicable      Portion Distortion:  -Group instruction provided by PowerPoint slides, verbal discussion, written materials, and food models to support subject matter. The instructor gives an explanation of serving size versus portion size, changes in portions sizes over the last 20 years, and what consists of a serving from each food group.   Stress Management:  -Group instruction provided by verbal instruction, video, and written materials to support subject matter.  Instructors review role of stress  in heart disease and how to cope with stress positively.     Exercising on Your Own:  -Group instruction provided by verbal instruction, power point, and written materials to support subject.  Instructors discuss benefits of exercise, components of exercise, frequency and intensity of exercise, and end points for exercise.  Also discuss use of nitroglycerin and activating EMS.  Review options of places to exercise outside of rehab.  Review guidelines for sex with heart disease.   Cardiac Drugs I:  -Group instruction provided by verbal instruction and written materials to support subject.  Instructor reviews cardiac drug classes: antiplatelets, anticoagulants, beta blockers, and statins.  Instructor discusses reasons, side effects, and lifestyle considerations for each drug class.   Cardiac Drugs II:  -Group instruction provided by verbal instruction and written materials to support subject.  Instructor reviews cardiac drug classes: angiotensin converting enzyme inhibitors (ACE-I), angiotensin II receptor blockers (ARBs), nitrates, and calcium channel blockers.  Instructor discusses reasons, side effects, and lifestyle considerations for each drug class.   Anatomy and Physiology of the Circulatory System:  Group verbal and written instruction and models provide basic cardiac anatomy and physiology, with the coronary electrical and arterial systems. Review of: AMI, Angina, Valve disease, Heart Failure, Peripheral Artery  Disease, Cardiac Arrhythmia, Pacemakers, and the ICD.   Other Education:  -Group or individual verbal, written, or video instructions that support the educational goals of the cardiac rehab program.   Knowledge Questionnaire Score: Knowledge Questionnaire Score - 12/13/16 0912      Knowledge Questionnaire Score   Pre Score  23/24       Core Components/Risk Factors/Patient Goals at Admission: Personal Goals and Risk Factors at Admission - 12/13/16 1030      Core  Components/Risk Factors/Patient Goals on Admission    Weight Management  Yes;Weight Maintenance;Weight Loss    Intervention  Weight Management: Develop a combined nutrition and exercise program designed to reach desired caloric intake, while maintaining appropriate intake of nutrient and fiber, sodium and fats, and appropriate energy expenditure required for the weight goal.;Weight Management: Provide education and appropriate resources to help participant work on and attain dietary goals.;Weight Management/Obesity: Establish reasonable short term and Summons term weight goals.    Admit Weight  190 lb 7.6 oz (86.4 kg)    Goal Weight: Short Term  180 lb (81.6 kg)    Goal Weight: Gallogly Term  175 lb (79.4 kg)    Expected Outcomes  Short Term: Continue to assess and modify interventions until short term weight is achieved;Tiedt Term: Adherence to nutrition and physical activity/exercise program aimed toward attainment of established weight goal;Weight Maintenance: Understanding of the daily nutrition guidelines, which includes 25-35% calories from fat, 7% or less cal from saturated fats, less than 200mg  cholesterol, less than 1.5gm of sodium, & 5 or more servings of fruits and vegetables daily;Weight Loss: Understanding of general recommendations for a balanced deficit meal plan, which promotes 1-2 lb weight loss per week and includes a negative energy balance of [phone] kcal/d;Understanding recommendations for meals to include 15-35% energy as protein, 25-35% energy from fat, 35-60% energy from carbohydrates, less than 200mg  of dietary cholesterol, 20-35 gm of total fiber daily;Understanding of distribution of calorie intake throughout the day with the consumption of 4-5 meals/snacks    Diabetes  Yes    Intervention  Provide education about signs/symptoms and action to take for hypo/hyperglycemia.;Provide education about proper nutrition, including hydration, and aerobic/resistive exercise prescription along with  prescribed medications to achieve blood glucose in normal ranges: Fasting glucose 65-99 mg/dL    Expected Outcomes  Short Term: Participant verbalizes understanding of the signs/symptoms and immediate care of hyper/hypoglycemia, proper foot care and importance of medication, aerobic/resistive exercise and nutrition plan for blood glucose control.;Babel Term: Attainment of HbA1C < 7%.    Hypertension  Yes    Intervention  Provide education on lifestyle modifcations including regular physical activity/exercise, weight management, moderate sodium restriction and increased consumption of fresh fruit, vegetables, and low fat dairy, alcohol moderation, and smoking cessation.;Monitor prescription use compliance.    Expected Outcomes  Short Term: Continued assessment and intervention until BP is < 140/35mm HG in hypertensive participants. < 130/46mm HG in hypertensive participants with diabetes, heart failure or chronic kidney disease.;H[place]cliff Term: Maintenance of blood pressure at goal levels.    Lipids  Yes    Intervention  Provide education and support for participant on nutrition & aerobic/resistive exercise along with prescribed medications to achieve LDL 70mg , HDL >40mg .    Expected Outcomes  Short Term: Participant states understanding of desired cholesterol values and is compliant with medications prescribed. Participant is following exercise prescription and nutrition guidelines.;Mccraw Term: Cholesterol controlled with medications as prescribed, with individualized exercise RX and with personalized nutrition plan. Value goals: LDL <  70mg , HDL > 40 mg.       Core Components/Risk Factors/Patient Goals Review:  Goals and Risk Factor Review    Row Name 01/13/17 1713             Core Components/Risk Factors/Patient Goals Review   Personal Goals Review  Weight Management/Obesity;Lipids;Hypertension;Diabetes       Review  [name]'s vital signs, weight and home reported CBG's have been stable at cardiac rehab        Expected Outcomes  [name] will continue to take his medications as presribed and come to cardiac rehab to exercise          Core Components/Risk Factors/Patient Goals at Discharge (Final Review):  Goals and Risk Factor Review - 01/13/17 1713      Core Components/Risk Factors/Patient Goals Review   Personal Goals Review  Weight Management/Obesity;Lipids;Hypertension;Diabetes    Review  [name]'s vital signs, weight and home reported CBG's have been stable at cardiac rehab    Expected Outcomes  [name] will continue to take his medications as presribed and come to cardiac rehab to exercise       ITP Comments: ITP Comments    Row Name 12/13/16 0851 01/13/17 1713         ITP Comments  Medical Director- Dr. Fransico Him, MD.  30 day ITP review. Patient with good participation and attendance at cardiac rehab.         Comments: See ITP comments.Barnet Pall, RN,BSN 01/13/2017 5:17 PM

## 2017-01-13 NOTE — Progress Notes (Signed)
Hunter Mcbride. 71 y.o. male DOB 1946/01/29 MRN [id number]       Nutrition  1. S/P CABG x 5    Meds reviewed. Invokana, Metformin noted  Note Spoke with pt. Nutrition Plan and Nutrition Survey reviewed with pt. Pt is not currently following a Heart Healthy diet. Pt feels he is following a healthy diet. Areas for improvement discussed. Pt is diabetic. Pt reports his last A1c was 6.7, which is down from 7.5 10/2016. This Probation officer went over Diabetes Education test results. Pt checks CBG's 1-2 times a day. Fasting CBG's reportedly 95-105 mg/dL. Pt expressed understanding of the information reviewed. Pt aware of nutrition education classes offered.  Nutrition Diagnosis ? Food-and nutrition-related knowledge deficit related to lack of exposure to information as related to diagnosis of: ? CVD ? DM ? Overweight related to excessive energy intake as evidenced by a BMI of 28.7  Nutrition Intervention ?  Pt's individual nutrition plan reviewed with pt. ? Benefits of adopting Heart Healthy diet discussed when Medficts reviewed.   ? Pt given handouts for: ? Nutrition I class ? Nutrition II class ? Diabetes Blitz Class   Nutrition Goal(s):  ? Improved blood glucose control as evidenced by pt's A1c trending from 7.5 to 7.0 or less. - met according to pt ? Pt to identify and limit food sources of saturated fat, trans fat, and sodium  Plan:  Pt to attend nutrition classes ? Portion Distortion ? Diabetes Q & A Will provide client-centered nutrition education as part of interdisciplinary care.   Monitor and evaluate progress toward nutrition goal with team.  Derek Mound, M.Ed, RD, LDN, CDE 01/13/2017 10:55 AM

## 2017-01-16 ENCOUNTER — Ambulatory Visit: Payer: PPO | Admitting: Nurse Practitioner

## 2017-01-16 ENCOUNTER — Encounter (HOSPITAL_COMMUNITY)
Admission: RE | Admit: 2017-01-16 | Discharge: 2017-01-16 | Disposition: A | Discharge status: Home / Self Care | Payer: PPO | Source: Ambulatory Visit | Attending: Cardiovascular Disease | Admitting: Cardiovascular Disease

## 2017-01-16 VITALS — BP 132/78 | HR 68 | Ht 68.25 in | Wt 186.5 lb

## 2017-01-16 DIAGNOSIS — Z951 Presence of aortocoronary bypass graft: Secondary | ICD-10-CM

## 2017-01-16 NOTE — Progress Notes (Signed)
Discharge Progress Report  Patient Details  Name: Hunter Mcbride. MRN: [id number] Date of Birth: July 21, 1945 Referring Provider:     CARDIAC REHAB PHASE II ORIENTATION from 12/13/2016 in [place]  Referring Provider  Sherren Mocha, MD       Number of Visits: 9  Reason for Discharge:  Early Exit:  Back to work  Smoking History:  Social History   Tobacco Use  Smoking Status Never Smoker  Smokeless Tobacco Never Used    Diagnosis:  S/P CABG x 5  ADL UCSD:   Initial Exercise Prescription: Initial Exercise Prescription - 12/13/16 1000      Date of Initial Exercise RX and Referring Provider   Date  12/13/16    Referring Provider  Sherren Mocha, MD      Treadmill   MPH  2.8    Grade  1    Minutes  10    METs  3.53      Bike   Level  1    Minutes  10    METs  3.22      NuStep   Level  3    SPM  80    Minutes  10    METs  3      Prescription Details   Frequency (times per week)  3    Duration  Progress to 30 minutes of continuous aerobic without signs/symptoms of physical distress      Intensity   THRR 40-80% of Max Heartrate  60-120    Ratings of Perceived Exertion  11-13    Perceived Dyspnea  0-4      Progression   Progression  Continue to progress workloads to maintain intensity without signs/symptoms of physical distress.      Resistance Training   Training Prescription  Yes    Weight  3lbs    Reps  10-15       Discharge Exercise Prescription (Final Exercise Prescription Changes): Exercise Prescription Changes - 01/16/17 0948      Response to Exercise   Blood Pressure (Admit)  132/78    Blood Pressure (Exercise)  192/90    Blood Pressure (Exit)  122/76    Heart Rate (Admit)  68 bpm    Heart Rate (Exercise)  116 bpm    Heart Rate (Exit)  76 bpm    Rating of Perceived Exertion (Exercise)  13    Symptoms  none    Duration  Continue with 30 min of aerobic exercise without signs/symptoms of physical  distress.    Intensity  THRR unchanged      Progression   Progression  Continue to progress workloads to maintain intensity without signs/symptoms of physical distress.    Average METs  3.9      Resistance Training   Training Prescription  Yes    Weight  5lbs    Reps  10-15    Time  10 Minutes      Interval Training   Interval Training  No      Bike   Level  1.6    Minutes  10    METs  4.57      NuStep   Level  3    SPM  80    Minutes  10    METs  3.4      Track   Laps  10    Minutes  6    METs  3.89      Home  Exercise Plan   Plans to continue exercise at  Home (comment)    Frequency  Add 4 additional days to program exercise sessions.    Initial Home Exercises Provided  12/28/16       Functional Capacity: 6 Minute Walk    Row Name 12/13/16 1023 01/16/17 0959       6 Minute Walk   Phase  Initial  Discharge    Distance  1793 feet  1996 feet    Distance % Change  -  11.32 %    Walk Time  6 minutes  6 minutes    # of Rest Breaks  0  0    MPH  3.4  3.78    METS  4.1  4.78    RPE  11  10    VO2 Peak  14.3  16.72    Symptoms  Yes (comment)  Yes (comment)    Comments  R hip pain, "clicking"  R hip pain, "clicking" "5/64" on the pain scale.    Resting HR  66 bpm  68 bpm    Resting BP  142/70  132/78    Resting Oxygen Saturation   100 %  -    Exercise Oxygen Saturation  during 6 min walk  99 %  -    Max Ex. HR  128 bpm  116 bpm    Max Ex. BP  142/84  192/90    2 Minute Post BP  142/70  182/82       Psychological, QOL, Others - Outcomes: PHQ 2/9: Depression screen [facility] 2/9 01/16/2017 12/19/2016  Decreased Interest 0 0  Down, Depressed, Hopeless 0 0  PHQ - 2 Score 0 0    Quality of Life: Quality of Life - 12/13/16 1024      Quality of Life Scores   Health/Function Pre  23.6 %    Socioeconomic Pre  30 %    Psych/Spiritual Pre  29.14 %    Family Pre  28.8 %    GLOBAL Pre  26.91 %       Personal Goals: Goals established at orientation with  interventions provided to work toward goal. Personal Goals and Risk Factors at Admission - 12/13/16 1030      Core Components/Risk Factors/Patient Goals on Admission    Weight Management  Yes;Weight Maintenance;Weight Loss    Intervention  Weight Management: Develop a combined nutrition and exercise program designed to reach desired caloric intake, while maintaining appropriate intake of nutrient and fiber, sodium and fats, and appropriate energy expenditure required for the weight goal.;Weight Management: Provide education and appropriate resources to help participant work on and attain dietary goals.;Weight Management/Obesity: Establish reasonable short term and Eldred[place] term weight goals.    Admit Weight  190 lb 7.6 oz (86.4 kg)    Goal Weight: Short Term  180 lb (81.6 kg)    Goal Weight: Higham Term  175 lb (79.4 kg)    Expected Outcomes  Short Term: Continue to assess and modify interventions until short term weight is achieved;Behan Term: Adherence to nutrition and physical activity/exercise program aimed toward attainment of established weight goal;Weight Maintenance: Understanding of the daily nutrition guidelines, which includes 25-35% calories from fat, 7% or less cal from saturated fats, less than 21m cholesterol, less than 1.5gm of sodium, & 5 or more servings of fruits and vegetables daily;Weight Loss: Understanding of general recommendations for a balanced deficit meal plan, which promotes 1-2 lb weight loss per week and includes a  negative energy balance of [phone] kcal/d;Understanding recommendations for meals to include 15-35% energy as protein, 25-35% energy from fat, 35-60% energy from carbohydrates, less than 262m of dietary cholesterol, 20-35 gm of total fiber daily;Understanding of distribution of calorie intake throughout the day with the consumption of 4-5 meals/snacks    Diabetes  Yes    Intervention  Provide education about signs/symptoms and action to take for  hypo/hyperglycemia.;Provide education about proper nutrition, including hydration, and aerobic/resistive exercise prescription along with prescribed medications to achieve blood glucose in normal ranges: Fasting glucose 65-99 mg/dL    Expected Outcomes  Short Term: Participant verbalizes understanding of the signs/symptoms and immediate care of hyper/hypoglycemia, proper foot care and importance of medication, aerobic/resistive exercise and nutrition plan for blood glucose control.;Burkes Term: Attainment of HbA1C < 7%.    Hypertension  Yes    Intervention  Provide education on lifestyle modifcations including regular physical activity/exercise, weight management, moderate sodium restriction and increased consumption of fresh fruit, vegetables, and low fat dairy, alcohol moderation, and smoking cessation.;Monitor prescription use compliance.    Expected Outcomes  Short Term: Continued assessment and intervention until BP is < 140/977mHG in hypertensive participants. < 130/8042mG in hypertensive participants with diabetes, heart failure or chronic kidney disease.;Wahlberg Term: Maintenance of blood pressure at goal levels.    Lipids  Yes    Intervention  Provide education and support for participant on nutrition & aerobic/resistive exercise along with prescribed medications to achieve LDL <58m1mDL >40mg15m Expected Outcomes  Short Term: Participant states understanding of desired cholesterol values and is compliant with medications prescribed. Participant is following exercise prescription and nutrition guidelines.;Weisgerber Term: Cholesterol controlled with medications as prescribed, with individualized exercise RX and with personalized nutrition plan. Value goals: LDL < 58mg,55m > 40 mg.        Personal Goals Discharge: Goals and Risk Factor Review    Row Name 01/13/17 1713             Core Components/Risk Factors/Patient Goals Review   Personal Goals Review  Weight  Management/Obesity;Lipids;Hypertension;Diabetes       Review  [name]'s vital signs, weight and home reported CBG's have been stable at cardiac rehab       Expected Outcomes  [name] will continue to take his medications as presribed and come to cardiac rehab to exercise          Exercise Goals and Review: Exercise Goals    Row Name 12/13/16 0943             Exercise Goals   Increase Physical Activity  Yes       Intervention  Provide advice, education, support and counseling about physical activity/exercise needs.;Develop an individualized exercise prescription for aerobic and resistive training based on initial evaluation findings, risk stratification, comorbidities and participant's personal goals.       Expected Outcomes  Achievement of increased cardiorespiratory fitness and enhanced flexibility, muscular endurance and strength shown through measurements of functional capacity and personal statement of participant.       Increase Strength and Stamina  Yes  return to fishing and hunting. Korol term:  be able to climb trees, pull a bow & arrow and pull tiller and chain saw        Intervention  Provide advice, education, support and counseling about physical activity/exercise needs.;Develop an individualized exercise prescription for aerobic and resistive training based on initial evaluation findings, risk stratification, comorbidities and participant's personal goals.  Expected Outcomes  Achievement of increased cardiorespiratory fitness and enhanced flexibility, muscular endurance and strength shown through measurements of functional capacity and personal statement of participant.       Able to understand and use rate of perceived exertion (RPE) scale  Yes       Intervention  Provide education and explanation on how to use RPE scale       Expected Outcomes  Short Term: Able to use RPE daily in rehab to express subjective intensity level;Smola Term:  Able to use RPE to guide intensity level when  exercising independently       Knowledge and understanding of Target Heart Rate Range (THRR)  Yes       Intervention  Provide education and explanation of THRR including how the numbers were predicted and where they are located for reference       Expected Outcomes  Short Term: Able to state/look up THRR;Aime Term: Able to use THRR to govern intensity when exercising independently;Short Term: Able to use daily as guideline for intensity in rehab       Able to check pulse independently  Yes       Intervention  Provide education and demonstration on how to check pulse in carotid and radial arteries.;Review the importance of being able to check your own pulse for safety during independent exercise       Expected Outcomes  Short Term: Able to explain why pulse checking is important during independent exercise;Twyford Term: Able to check pulse independently and accurately       Understanding of Exercise Prescription  Yes       Intervention  Provide education, explanation, and written materials on patient's individual exercise prescription       Expected Outcomes  Short Term: Able to explain program exercise prescription;Perdue Term: Able to explain home exercise prescription to exercise independently          Nutrition & Weight - Outcomes: Pre Biometrics - 12/13/16 1205      Pre Biometrics   Height  5' 8.25" (1.734 m)    Weight  190 lb 7.6 oz (86.4 kg)    Waist Circumference  37.75 inches    Hip Circumference  37.75 inches    Waist to Hip Ratio  1 %    BMI (Calculated)  28.74    Triceps Skinfold  20 mm    % Body Fat  28.1 %    Grip Strength  41 kg    Flexibility  11.75 in    Single Leg Stand  30 seconds      Post Biometrics - 01/16/17 0959       Post  Biometrics   Height  5' 8.25" (1.734 m)    Weight  186 lb 8.2 oz (84.6 kg)    Waist Circumference  37 inches    Hip Circumference  38 inches    Waist to Hip Ratio  0.97 %    BMI (Calculated)  28.14    Grip Strength  44 kg    Flexibility   11.5 in    Single Leg Stand  30 seconds       Nutrition: Nutrition Therapy & Goals - 12/15/16 1402      Nutrition Therapy   Diet  Carb Modified, Therapeutic Lifestyle Changes      Personal Nutrition Goals   Nutrition Goal  Pt to identify and limit food sources of saturated fat, trans fat, and sodium    Personal Goal #2  Improved  blood glucose control as evidenced by pt's A1c trending from 7.5 to 7.0 or less.       Intervention Plan   Intervention  Prescribe, educate and counsel regarding individualized specific dietary modifications aiming towards targeted core components such as weight, hypertension, lipid management, diabetes, heart failure and other comorbidities.    Expected Outcomes  Short Term Goal: Understand basic principles of dietary content, such as calories, fat, sodium, cholesterol and nutrients.;Stockinger Term Goal: Adherence to prescribed nutrition plan.       Nutrition Discharge: Nutrition Assessments - 01/13/17 1132      MEDFICTS Scores   Pre Score  71       Education Questionnaire Score: Knowledge Questionnaire Score - 12/13/16 0912      Knowledge Questionnaire Score   Pre Score  23/24       Goals reviewed with patient; copy given to patient. [name] graduated from cardiac rehab program today with completion of 10 exercise sessions in Phase II. Pt maintained good attendance and progressed nicely during his participation in rehab e as evidenced by increased MET level.   Medication list reconciled. Repeat  PHQ score-  0.  Pt has made significant lifestyle changes and should be commended for his success. Pt feels he has achieved his goals during cardiac rehab.   Pt plans to continue exercise by exercising on his own. [name] finished the program early so he can return to work. [name] did have some intermittent resting and exertional BP elevations I made Truitt Merle NP aware of. We are proud of [name]'s progress.Barnet Pall, RN,BSN 01/31/2017 3:44 PM

## 2017-01-18 ENCOUNTER — Encounter (HOSPITAL_COMMUNITY): Payer: PPO

## 2017-01-19 ENCOUNTER — Other Ambulatory Visit: Payer: Self-pay

## 2017-01-19 ENCOUNTER — Ambulatory Visit (INDEPENDENT_AMBULATORY_CARE_PROVIDER_SITE_OTHER): Payer: Self-pay | Admitting: Cardiothoracic Surgery

## 2017-01-19 VITALS — BP 143/81 | HR 62 | Ht 68.5 in | Wt 186.0 lb

## 2017-01-19 DIAGNOSIS — I25110 Atherosclerotic heart disease of native coronary artery with unstable angina pectoris: Secondary | ICD-10-CM

## 2017-01-19 NOTE — Progress Notes (Signed)
[address]       [place] [address]             Hunter Mcbride Record #[id number] Date of Birth: 02/13/1946  Referring: Leonie Man, MD Primary Care: Crist Infante, MD  Chief Complaint:   POST OP FOLLOW UP 11/07/2016   OPERATIVE REPORT SURGICAL PROCEDURE:  Coronary artery bypass grafting x5 with the left internal mammary to the distal left anterior descending coronary artery, reverse saphenous vein graft to the diagonal, reverse saphenous vein graft to the first obtuse marginal, sequential reverse saphenous vein graft to the proximal posterior lateral branch and to the distal posterior descending with right leg, thigh, and calf greater saphenous vein harvesting endoscopically. SURGEON:  Lanelle Bal, M.D.  History of Present Illness:     Patient returns after recent coronary artery bypass grafting on September 24.  He is making steady progress postoperatively returning to near normal activities.  Still remains somewhat frustrated that he cannot climb in his deer stand and move around as well as he could in the past.  He has had no evidence of congestive heart failure or recurrent angina   Past Medical History:  Diagnosis Date  . Arthritis    "lower back; right shoulder; left thumb; joints" (06/21/2013)  . Asthma    "not sure if this is true or not" (06/21/2013)  . CAD (coronary artery disease) May 2015   s/p PTCA/DES x 3 to mid LAD, mod non-ob disease in Cx and RCA May 2015; s/p repeat caths - last study in November of 2015 - stents patent, - FFR - managed medically.   [name] GERD (gastroesophageal reflux disease)   . Gout    "maybe twice in my life"  . Headache(784.0)    "weekly for the last 3-4 months" (06/21/2013)  . Hyperlipidemia   . Hypertension   . Myocardial infarction [facility])    "/Dr. Katharina Caper I've had one between 2014-2015) (06/21/2013)  . Osteoarthritis   . Sleep apnea    WEIGHT LOSS, NO LONGER NEEDS PER  PATIENT  . Type II diabetes mellitus (HCC)    TYPE 2     Social History   Tobacco Use  Smoking Status Never Smoker  Smokeless Tobacco Never Used    Social History   Substance and Sexual Activity  Alcohol Use No     Allergies  Allergen Reactions  . Oxycodone Nausea Only    Current Outpatient Medications  Medication Sig Dispense Refill  . acetaminophen (TYLENOL) 500 MG tablet Take 1,000 mg by mouth every 8 (eight) hours as needed (pain).     [name] ALPRAZolam (XANAX) 0.5 MG tablet Take 0.5 tablets (0.25 mg total) by mouth daily as needed for anxiety.  0  . amLODipine (NORVASC) 5 MG tablet Take 1 tablet (5 mg total) by mouth daily. 30 tablet 3  . aspirin 81 MG tablet Take 81 mg by mouth at bedtime.     [name] atenolol (TENORMIN) 50 MG tablet Take 1 tablet (50 mg total) by mouth at bedtime. 90 tablet 3  . atorvastatin (LIPITOR) 80 MG tablet Take 80 mg by mouth daily at 6 PM.    . canagliflozin (INVOKANA) 300 MG TABS tablet Take 300 mg by mouth daily before breakfast.    . Cholecalciferol (VITAMIN D PO) Take 1 capsule by mouth daily.    . clopidogrel (PLAVIX) 75 MG tablet Take 1 tablet (75 mg  total) by mouth daily with breakfast. 90 tablet 3  . docusate sodium (COLACE) 100 MG capsule Take 100 mg by mouth daily as needed for mild constipation.    . gabapentin (NEURONTIN) 300 MG capsule Take 300 mg by mouth at bedtime.     . irbesartan (AVAPRO) 150 MG tablet Take 1 tablet (150 mg total) daily by mouth. 30 tablet 11  . metFORMIN (FORTAMET) 1000 MG (OSM) 24 hr tablet Take 1 tablet (1,000 mg total) by mouth 2 (two) times daily with a meal. 60 tablet 1  . pantoprazole (PROTONIX) 40 MG tablet Take 40 mg by mouth 2 (two) times daily.    . polyethylene glycol (MIRALAX / GLYCOLAX) packet Take 17 g by mouth daily as needed for mild constipation.    . traMADol (ULTRAM) 50 MG tablet Take 1 tablet (50 mg total) by mouth every 6 (six) hours as needed for moderate pain. 30 tablet 0   No current  facility-administered medications for this visit.        Physical Exam: BP (!) 143/81   Pulse 62   Ht 5' 8.5" (1.74 m)   Wt 186 lb (84.4 kg)   SpO2 99%   BMI 27.87 kg/m   General appearance: alert and cooperative Neurologic: intact Heart: regular rate and rhythm, S1, S2 normal, no murmur, click, rub or gallop Lungs: clear to auscultation bilaterally Abdomen: soft, non-tender; bowel sounds normal; no masses,  no organomegaly Extremities: extremities normal, atraumatic, no cyanosis or edema and Homans sign is negative, no sign of DVT Wound: Sternum is stable and well-healed   Diagnostic Studies & Laboratory data:      Recent Lab Findings: Lab Results  Component Value Date   WBC 4.1 11/28/2016   HGB 10.5 (L) 11/28/2016   HCT 31.7 (L) 11/28/2016   PLT 240 11/28/2016   GLUCOSE 146 (H) 11/28/2016   CHOL 84 10/28/2016   TRIG 31 10/28/2016   HDL 36 (L) 10/28/2016   LDLCALC 42 10/28/2016   ALT 44 11/03/2016   AST 27 11/03/2016   NA 142 11/28/2016   K 5.3 (H) 11/28/2016   CL 105 11/28/2016   CREATININE 0.82 11/28/2016   BUN 13 11/28/2016   CO2 22 11/28/2016   TSH 2.009 06/06/2016   INR 1.51 11/07/2016   HGBA1C 7.5 (H) 11/03/2016      Assessment / Plan:      Patient doing well postoperatively without evidence of congestive heart failure.  Increasing physical activity appropriately without recurrent angina Patient graduated himself from cardiac rehab Continues on Plavix and statin therapy    Grace Isaac MD      [address] [place], [address] Office [phone]   Beeper [phone]  01/19/2017 11:58 AM

## 2017-01-20 ENCOUNTER — Encounter (HOSPITAL_COMMUNITY): Payer: PPO

## 2017-01-23 ENCOUNTER — Encounter (HOSPITAL_COMMUNITY): Payer: PPO

## 2017-01-25 ENCOUNTER — Encounter (HOSPITAL_COMMUNITY): Payer: PPO

## 2017-01-27 ENCOUNTER — Encounter (HOSPITAL_COMMUNITY): Payer: PPO

## 2017-01-30 ENCOUNTER — Encounter (HOSPITAL_COMMUNITY): Payer: PPO

## 2017-02-01 ENCOUNTER — Encounter (HOSPITAL_COMMUNITY): Payer: PPO

## 2017-02-03 ENCOUNTER — Encounter (HOSPITAL_COMMUNITY): Payer: PPO

## 2017-02-06 ENCOUNTER — Encounter (HOSPITAL_COMMUNITY): Payer: PPO

## 2017-02-08 ENCOUNTER — Encounter (HOSPITAL_COMMUNITY): Payer: PPO

## 2017-02-10 ENCOUNTER — Encounter (HOSPITAL_COMMUNITY): Payer: PPO

## 2017-02-13 ENCOUNTER — Encounter (HOSPITAL_COMMUNITY): Payer: PPO

## 2017-02-15 ENCOUNTER — Encounter (HOSPITAL_COMMUNITY): Payer: PPO

## 2017-02-17 ENCOUNTER — Encounter (HOSPITAL_COMMUNITY): Payer: PPO

## 2017-02-20 ENCOUNTER — Encounter (HOSPITAL_COMMUNITY): Payer: PPO

## 2017-02-22 ENCOUNTER — Encounter (HOSPITAL_COMMUNITY): Payer: PPO

## 2017-02-23 DIAGNOSIS — Z96641 Presence of right artificial hip joint: Secondary | ICD-10-CM | POA: Diagnosis not present

## 2017-02-24 ENCOUNTER — Encounter (HOSPITAL_COMMUNITY): Payer: PPO

## 2017-02-27 ENCOUNTER — Encounter (HOSPITAL_COMMUNITY): Payer: PPO

## 2017-03-01 ENCOUNTER — Encounter (HOSPITAL_COMMUNITY): Payer: PPO

## 2017-03-03 ENCOUNTER — Encounter (HOSPITAL_COMMUNITY): Payer: PPO

## 2017-03-06 ENCOUNTER — Encounter (HOSPITAL_COMMUNITY): Payer: PPO

## 2017-03-07 IMAGING — DX DG CHEST 2V
2 series · 2 of 2 positions shown · non-contrast
Comparison: Chest radiograph dated 02/24/2015

CLINICAL DATA: 70-year-old male with left-sided chest pain.

EXAM:
CHEST  2 VIEW

[chest pa]
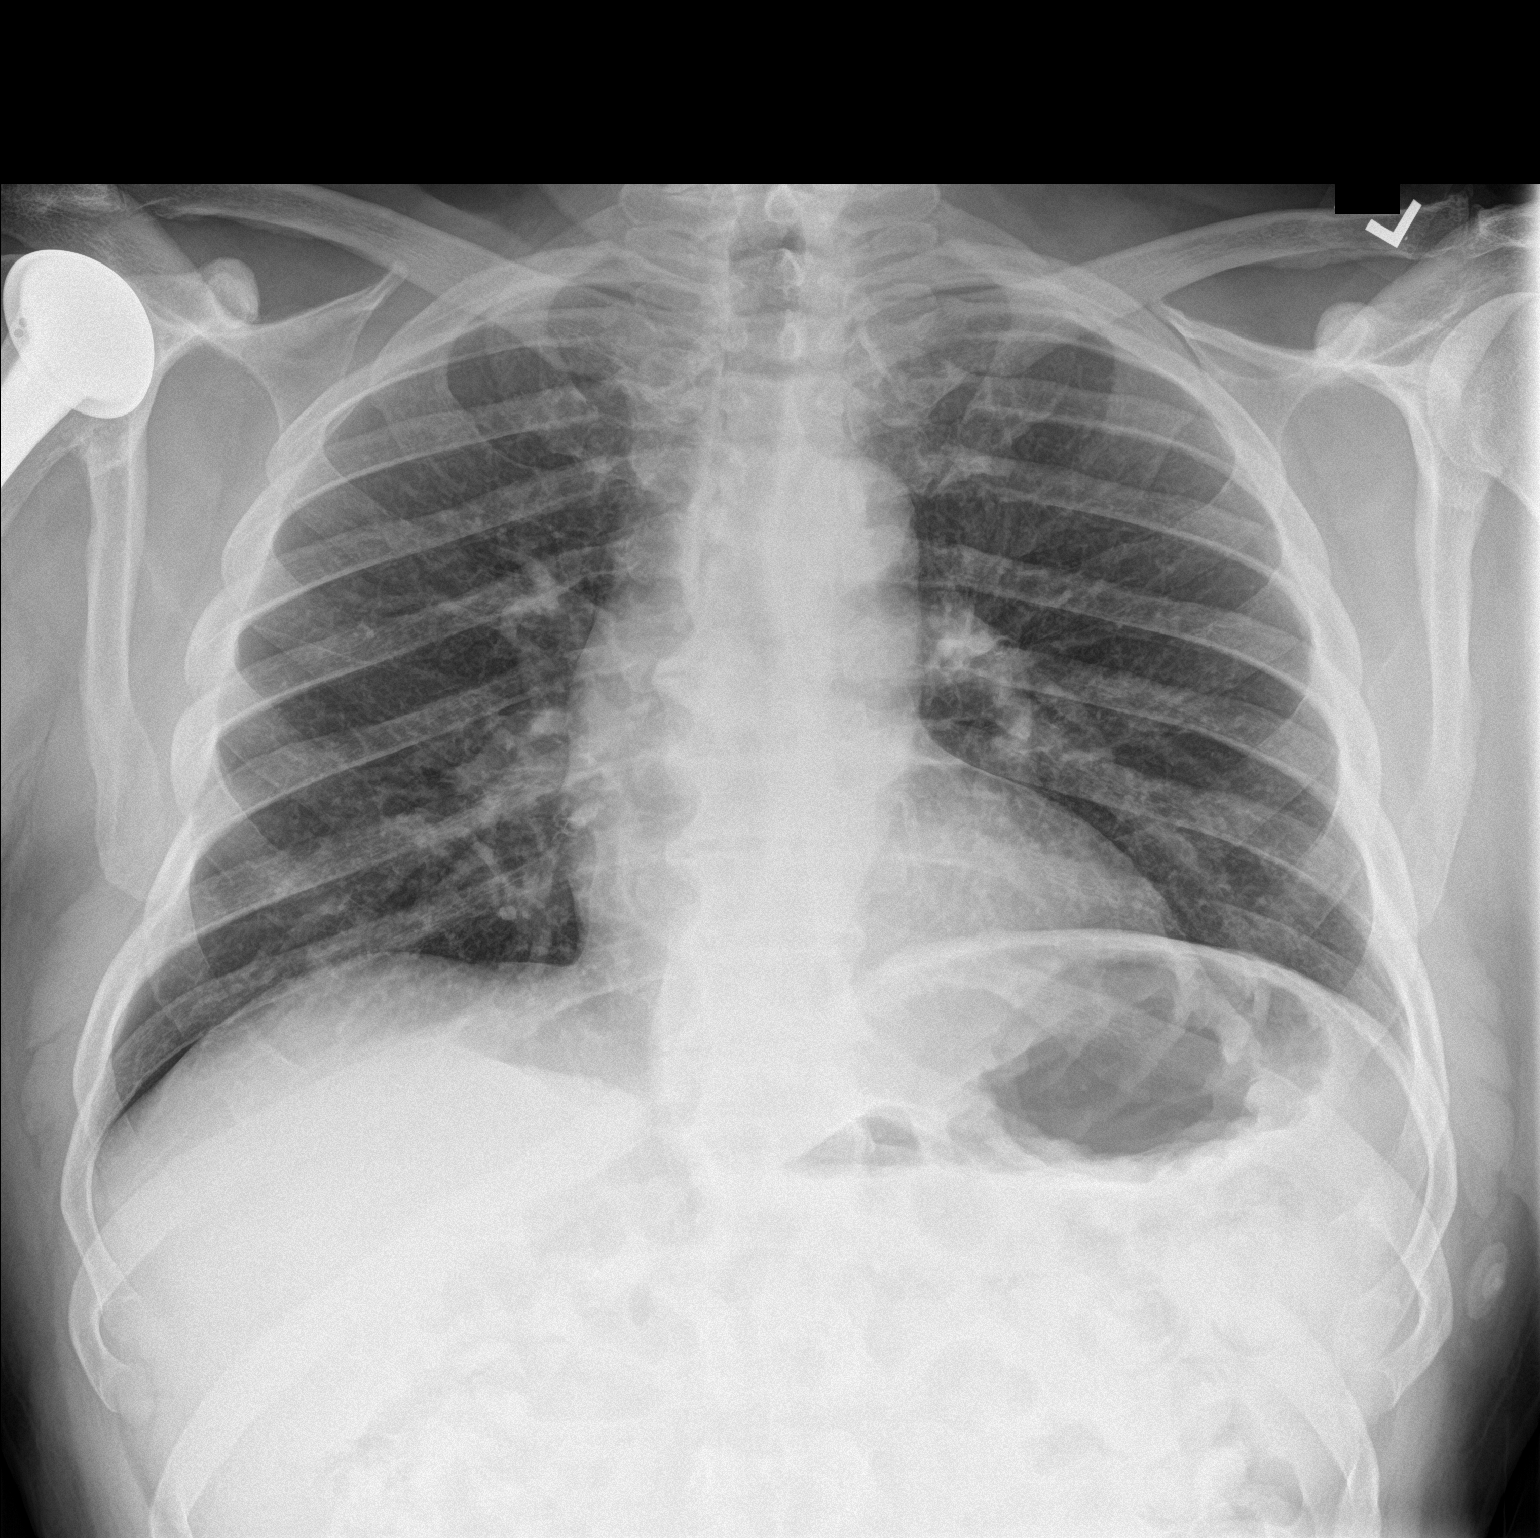

[chest lat]
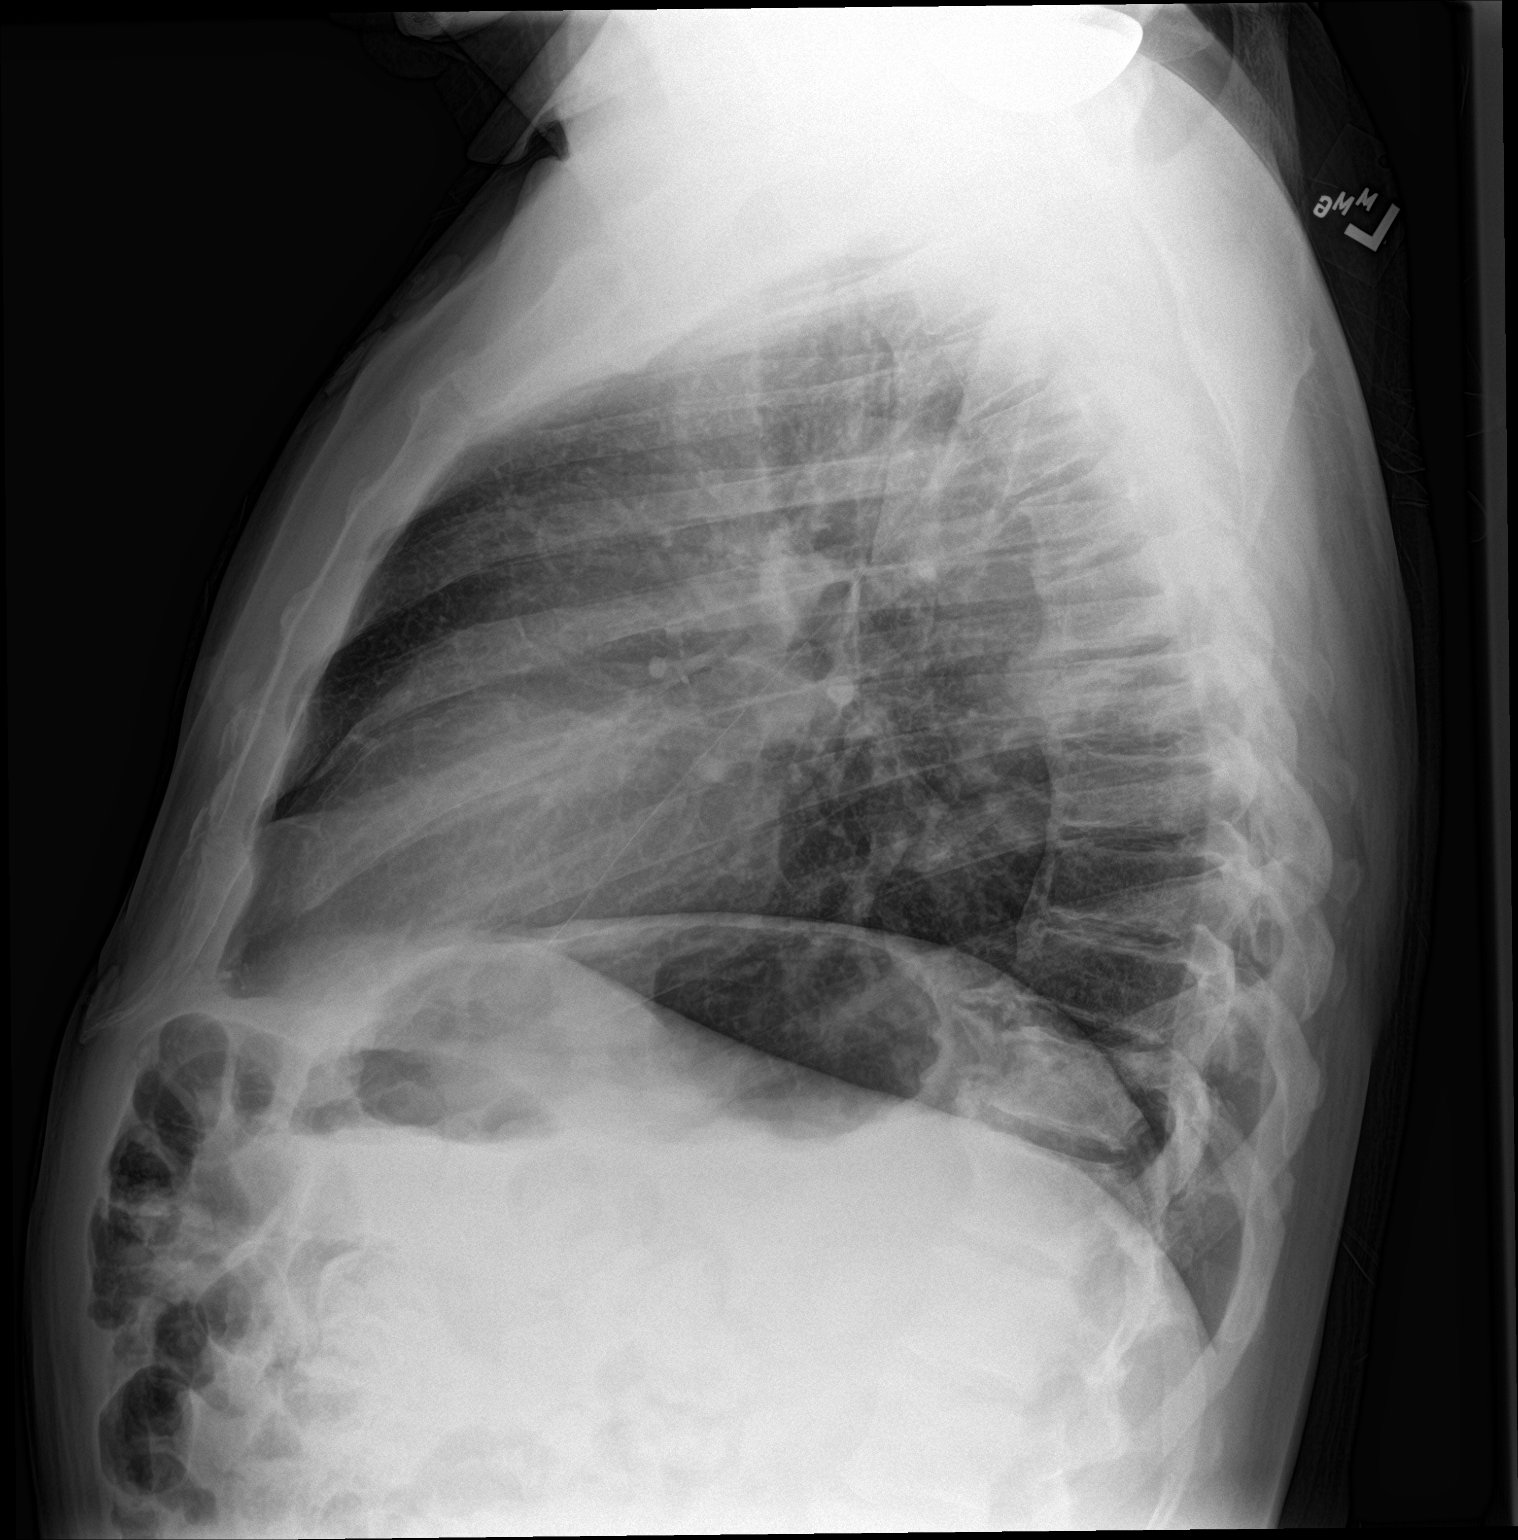

[2 of 2 positions shown; findings below may reference images not displayed]

FINDINGS: Two views of the chest do not demonstrate a focal consolidation.
There is no pleural effusion or pneumothorax. The cardiac silhouette
is within normal limits. No acute osseous pathology. Right shoulder
hemiarthroplasty.
IMPRESSION: No active cardiopulmonary disease.

## 2017-03-08 ENCOUNTER — Encounter (HOSPITAL_COMMUNITY): Payer: PPO

## 2017-03-10 ENCOUNTER — Encounter (HOSPITAL_COMMUNITY): Payer: PPO

## 2017-03-13 ENCOUNTER — Encounter (HOSPITAL_COMMUNITY): Payer: PPO

## 2017-03-15 ENCOUNTER — Encounter (HOSPITAL_COMMUNITY): Payer: PPO

## 2017-03-15 DIAGNOSIS — E119 Type 2 diabetes mellitus without complications: Secondary | ICD-10-CM | POA: Diagnosis not present

## 2017-03-15 DIAGNOSIS — H40003 Preglaucoma, unspecified, bilateral: Secondary | ICD-10-CM | POA: Diagnosis not present

## 2017-03-17 ENCOUNTER — Encounter (HOSPITAL_COMMUNITY): Payer: PPO

## 2017-03-20 ENCOUNTER — Encounter (HOSPITAL_COMMUNITY): Payer: PPO

## 2017-03-22 ENCOUNTER — Encounter (HOSPITAL_COMMUNITY): Payer: PPO

## 2017-04-06 ENCOUNTER — Encounter: Payer: Self-pay | Admitting: Cardiovascular Disease

## 2017-04-06 ENCOUNTER — Ambulatory Visit: Payer: PPO | Admitting: Cardiovascular Disease

## 2017-04-06 VITALS — BP 114/62 | HR 66 | Ht 69.0 in | Wt 195.0 lb

## 2017-04-06 DIAGNOSIS — E119 Type 2 diabetes mellitus without complications: Secondary | ICD-10-CM | POA: Diagnosis not present

## 2017-04-06 DIAGNOSIS — E785 Hyperlipidemia, unspecified: Secondary | ICD-10-CM

## 2017-04-06 DIAGNOSIS — I2510 Atherosclerotic heart disease of native coronary artery without angina pectoris: Secondary | ICD-10-CM | POA: Diagnosis not present

## 2017-04-06 DIAGNOSIS — I10 Essential (primary) hypertension: Secondary | ICD-10-CM

## 2017-04-06 DIAGNOSIS — I2584 Coronary atherosclerosis due to calcified coronary lesion: Secondary | ICD-10-CM

## 2017-04-06 NOTE — Progress Notes (Signed)
Cardiology Office Note Date:  04/06/2017   ID:  Hunter Stakes., DOB 12-08-1945, MRN [id number]  PCP:  Crist Infante, MD  Cardiologist:  Sherren Mocha, MD    Chief Complaint  Patient presents with  . Coronary Artery Disease     History of Present Illness: Hunter Allston. is a 72 y.o. male who presents for follow-up of coronary artery disease and chronic angina.  Patient had undergone stenting in 2015 and underwent repeated cardiac catheterizations because of persistent chest pain.  He was noted to have diffuse coronary artery disease with a lot of distal vessel and small vessel involvement.  He ultimately was treated with multivessel CABG in 2018.  He presents today for follow-up evaluation.  The patient is here with his wife today.  He is really doing very well.  She states that when he sneezes he sometimes feels his lowest sternal wire move.  He does not really have any pain associated with this.  He otherwise has no problems and has chest pain is markedly reduced.  He had an episode of discomfort about 3-4 weeks ago but he is not sure if this was cardiac related.  He had eaten a lot of peanuts prior to the pain which occurred in his left chest and left upper back.  He wonders if it could have been gallbladder related.  He has not had any recent exertional symptoms and fact feels quite well.  He denies shortness of breath, heart palpitations, orthopnea, or PND.  Past Medical History:  Diagnosis Date  . Arthritis    "lower back; right shoulder; left thumb; joints" (06/21/2013)  . Asthma    "not sure if this is true or not" (06/21/2013)  . CAD (coronary artery disease) May 2015   s/p PTCA/DES x 3 to mid LAD, mod non-ob disease in Cx and RCA May 2015; s/p repeat caths - last study in November of 2015 - stents patent, - FFR - managed medically.   [name] GERD (gastroesophageal reflux disease)   . Gout    "maybe twice in my life"  . Headache(784.0)    "weekly for the last 3-4 months"  (06/21/2013)  . Hyperlipidemia   . Hypertension   . Myocardial infarction [facility])    "/Dr. Katharina Caper I've had one between 2014-2015) (06/21/2013)  . Osteoarthritis   . Sleep apnea    WEIGHT LOSS, NO LONGER NEEDS PER PATIENT  . Type II diabetes mellitus ([place])    TYPE 2    Past Surgical History:  Procedure Laterality Date  . CARDIAC CATHETERIZATION  1980's   "once"  . CARDIAC CATHETERIZATION N/A 07/23/2015   Procedure: Left Heart Cath and Coronary Angiography;  Surgeon: Sherren Mocha, MD;  Location: [place] [facility];  Service: Cardiovascular;  Laterality: N/A;  . CARDIOVASCULAR STRESS TEST  06/10/2008   EF 68%  . CARPAL TUNNEL RELEASE Bilateral   . CORONARY ANGIOPLASTY WITH STENT PLACEMENT  06/21/2013   "3"  . CORONARY ARTERY BYPASS GRAFT N/A 11/07/2016   Procedure: CORONARY ARTERY BYPASS GRAFTING (CABG) x 5 (LIMA to DISTAL LAD, SVG to DIAGONAL, SVG to CIRCUMFLEX, and SVG SEQUENTIALLY to PLB and DISTAL PDA) with EVH of the RIGHT GREATER SAPHENOUS VEIN and LEFT INTERNAL MAMMARY ARTERY HARVEST;  Surgeon: Grace Isaac, MD;  Location: [place];  Service: Open Heart Surgery;  Laterality: N/A;  . CORONARY STENT INTERVENTION N/A 06/07/2016   Procedure: Coronary Stent Intervention;  Surgeon: Burnell Blanks, MD;  Location: [place] [facility]  [facility];  Service: Cardiovascular;  Laterality: N/A;  . EXCISION MORTON'S NEUROMA Left   . EYE SURGERY Left    "removed film over my eye"  . FRACTIONAL FLOW RESERVE WIRE N/A 06/26/2013   Procedure: FRACTIONAL FLOW RESERVE WIRE;  Surgeon: [name], MD;  Location: [place] C[facility];  Service: Cardiovascular;  Laterality: N/A;  . HERNIA REPAIR Left   . INTRAVASCULAR PRESSURE WIRE/FFR STUDY N/A 10/28/2016   Procedure: INTRAVASCULAR PRESSURE WIRE/FFR STUDY;  Surgeon: Leonie Man, MD;  Location: [place] [facility];  Service: Cardiovascular;  Laterality: N/A;  . JOINT REPLACEMENT Left 03/2013   "thumb"  . LEFT HEART CATH AND CORONARY ANGIOGRAPHY N/A 06/07/2016    Procedure: Left Heart Cath and Coronary Angiography;  Surgeon: Burnell Blanks, MD;  Location: [place] [facility];  Service: Cardiovascular;  Laterality: N/A;  . LEFT HEART CATH AND CORONARY ANGIOGRAPHY N/A 10/28/2016   Procedure: LEFT HEART CATH AND CORONARY ANGIOGRAPHY;  Surgeon: Leonie Man, MD;  Location: [place] [facility];  Service: Cardiovascular;  Laterality: N/A;  . LEFT HEART CATHETERIZATION WITH CORONARY ANGIOGRAM N/A 06/21/2013   Procedure: LEFT HEART CATHETERIZATION WITH CORONARY ANGIOGRAM;  Surgeon: Burnell Blanks, MD;  Location: [facility];  Service: Cardiovascular;  Laterality: N/A;  . LEFT HEART CATHETERIZATION WITH CORONARY ANGIOGRAM N/A 06/26/2013   Procedure: LEFT HEART CATHETERIZATION WITH CORONARY ANGIOGRAM;  Surgeon: [name], MD;  Location: [place] CATH [facility];  Service: Cardiovascular;  Laterality: N/A;  . TEE WITHOUT CARDIOVERSION N/A 11/07/2016   Procedure: TRANSESOPHAGEAL ECHOCARDIOGRAM (TEE);  Surgeon: Grace Isaac, MD;  Location: [place];  Service: Open Heart Surgery;  Laterality: N/A;  . TONSILLECTOMY    . TOTAL HIP ARTHROPLASTY Right 04/12/2016   Procedure: RIGHT TOTAL HIP ARTHROPLASTY ANTERIOR APPROACH;  Surgeon: Paralee Cancel, MD;  Location: [facility];  Service: Orthopedics;  Laterality: Right;  requests 70 mins  . TOTAL SHOULDER ARTHROPLASTY  03/01/2012   Procedure: TOTAL SHOULDER ARTHROPLASTY;  Surgeon: Marin Shutter, MD;  Location: [place];  Service: Orthopedics;  Laterality: Right;  . UMBILICAL HERNIA REPAIR      Current Outpatient Medications  Medication Sig Dispense Refill  . acetaminophen (TYLENOL) 500 MG tablet Take 1,000 mg by mouth every 8 (eight) hours as needed (pain).     [name] ALPRAZolam (XANAX) 0.5 MG tablet Take 0.5 tablets (0.25 mg total) by mouth daily as needed for anxiety.  0  . amLODipine (NORVASC) 5 MG tablet Take 5 mg by mouth daily.    [name] aspirin 81 MG tablet Take 81 mg by mouth at bedtime.     [name] atenolol (TENORMIN) 50 MG tablet  Take 1 tablet (50 mg total) by mouth at bedtime. 90 tablet 3  . atorvastatin (LIPITOR) 80 MG tablet Take 80 mg by mouth daily at 6 PM.    . canagliflozin (INVOKANA) 300 MG TABS tablet Take 300 mg by mouth daily before breakfast.    . Cholecalciferol (VITAMIN D PO) Take 1 capsule by mouth daily.    . clopidogrel (PLAVIX) 75 MG tablet Take 1 tablet (75 mg total) by mouth daily with breakfast. 90 tablet 3  . docusate sodium (COLACE) 100 MG capsule Take 100 mg by mouth daily as needed for mild constipation.    . gabapentin (NEURONTIN) 300 MG capsule Take 300 mg by mouth at bedtime.     . irbesartan (AVAPRO) 150 MG tablet Take 1 tablet (150 mg total) daily by mouth. 30 tablet 11  . metFORMIN (GLUCOPHAGE-XR)  500 MG 24 hr tablet Take 500 mg by mouth 3 (three) times daily.    . pantoprazole (PROTONIX) 40 MG tablet Take 40 mg by mouth 2 (two) times daily.    . polyethylene glycol (MIRALAX / GLYCOLAX) packet Take 17 g by mouth daily as needed for mild constipation.    . traMADol (ULTRAM) 50 MG tablet Take 1 tablet (50 mg total) by mouth every 6 (six) hours as needed for moderate pain. 30 tablet 0   No current facility-administered medications for this visit.     Allergies:   Oxycodone   Social History:  The patient  reports that  has never smoked. he has never used smokeless tobacco. He reports that he does not drink alcohol or use drugs.   Family History:  The patient's family history includes Coronary artery disease in his cousin; Diabetes type II in his mother; Heart attack in his father; Heart disease in his brother.    ROS:  Please see the history of present illness.   All other systems are reviewed and negative.    PHYSICAL EXAM: VS:  BP 114/62   Pulse 66   Ht 5\' 9"  (1.753 m)   Wt 195 lb (88.5 kg)   BMI 28.80 kg/m  , BMI Body mass index is 28.8 kg/m. GEN: Well nourished, well developed, in no acute distress  HEENT: normal  Neck: no JVD, no masses. No carotid bruits Cardiac: RRR  without murmur or gallop      Chest: the sternum appears stable with no deformity on palpation Respiratory:  clear to auscultation bilaterally, normal work of breathing GI: soft, nontender, nondistended, + BS MS: no deformity or atrophy  Ext: no pretibial edema, pedal pulses 2+= bilaterally Skin: warm and dry, no rash Neuro:  Strength and sensation are intact Psych: euthymic mood, full affect  EKG:  EKG is not ordered today.  Recent Labs: 06/06/2016: B Natriuretic Peptide 54.3; TSH 2.009 11/03/2016: ALT 44 11/08/2016: Magnesium 2.2 11/28/2016: BUN 13; Creatinine, Ser 0.82; Hemoglobin 10.5; Platelets 240; Potassium 5.3; Sodium 142   Lipid Panel     Component Value Date/Time   CHOL 84 10/28/2016 0455   TRIG 31 10/28/2016 0455   HDL 36 (L) 10/28/2016 0455   CHOLHDL 2.3 10/28/2016 0455   VLDL 6 10/28/2016 0455   LDLCALC 42 10/28/2016 0455      Wt Readings from Last 3 Encounters:  04/06/17 195 lb (88.5 kg)  01/19/17 186 lb (84.4 kg)  01/16/17 186 lb 8.2 oz (84.6 kg)    ASSESSMENT AND PLAN: 1.  CAD, native vessel, with angina: The patient seems to be doing very well after CABG.  His current medicines will be continued.  He is treated with aspirin and clopidogrel, and ARB in the setting of diabetes, a beta-blocker, and a high intensity statin drug.  2.  Hypertension: Blood pressure is well controlled on his current regimen which he seems to be tolerating well.  3.  Hyperlipidemia: Excellent lipids.  He will continue his current program which includes atorvastatin 80 mg daily.  Overall Hunter Mcbride seems to be doing really well.  He will follow-up with Truitt Merle in 3 months.  Current medicines are reviewed with the patient today.  The patient does not have concerns regarding medicines.  Labs/ tests ordered today include:  No orders of the defined types were placed in this encounter.   Disposition:   FU 3 months with Sindy Guadeloupe, Sherren Mocha, MD  04/06/2017 9:34 AM  [facility] [place], [place], [place]  [address] Phone: [phone]; Fax: [phone]

## 2017-04-06 NOTE — Patient Instructions (Signed)
Medication Instructions:  Your provider recommends that you continue on your current medications as directed. Please refer to the Current Medication list given to you today.    Labwork: None  Testing/Procedures: None  Follow-Up: Your provider recommends that you schedule a follow-up appointment in 3 MONTHS with Truitt Merle, NP.  Any Other Special Instructions Will Be Listed Below (If Applicable).     If you need a refill on your cardiac medications before your next appointment, please call your pharmacy.

## 2017-04-12 ENCOUNTER — Telehealth: Payer: Self-pay

## 2017-04-12 MED ORDER — NITROGLYCERIN 0.4 MG SL SUBL: 0.4000 mg | SUBLINGUAL_TABLET | SUBLINGUAL | 3 refills | Status: DC | PRN

## 2017-04-12 NOTE — Telephone Encounter (Signed)
Informed patient's wife NTG has been sent. She was grateful for call.

## 2017-04-12 NOTE — Telephone Encounter (Signed)
Ok to let him have.   Thanks Abdulrahman Bracey

## 2017-04-12 NOTE — Telephone Encounter (Signed)
The patient was seen recently and asked if NTG should be refilled (this was d/c'd at discharge from admission last September) just in case he needs it.

## 2017-07-04 ENCOUNTER — Ambulatory Visit: Payer: PPO | Admitting: Nurse Practitioner

## 2017-07-06 DIAGNOSIS — R82998 Other abnormal findings in urine: Secondary | ICD-10-CM | POA: Diagnosis not present

## 2017-07-06 DIAGNOSIS — E119 Type 2 diabetes mellitus without complications: Secondary | ICD-10-CM | POA: Diagnosis not present

## 2017-07-06 DIAGNOSIS — E7849 Other hyperlipidemia: Secondary | ICD-10-CM | POA: Diagnosis not present

## 2017-07-06 DIAGNOSIS — R5383 Other fatigue: Secondary | ICD-10-CM | POA: Diagnosis not present

## 2017-07-06 DIAGNOSIS — I10 Essential (primary) hypertension: Secondary | ICD-10-CM | POA: Diagnosis not present

## 2017-07-06 DIAGNOSIS — Z125 Encounter for screening for malignant neoplasm of prostate: Secondary | ICD-10-CM | POA: Diagnosis not present

## 2017-07-06 DIAGNOSIS — R718 Other abnormality of red blood cells: Secondary | ICD-10-CM | POA: Diagnosis not present

## 2017-07-13 DIAGNOSIS — Z6828 Body mass index (BMI) 28.0-28.9, adult: Secondary | ICD-10-CM | POA: Diagnosis not present

## 2017-07-13 DIAGNOSIS — M7021 Olecranon bursitis, right elbow: Secondary | ICD-10-CM | POA: Diagnosis not present

## 2017-07-13 DIAGNOSIS — I208 Other forms of angina pectoris: Secondary | ICD-10-CM | POA: Diagnosis not present

## 2017-07-13 DIAGNOSIS — G4733 Obstructive sleep apnea (adult) (pediatric): Secondary | ICD-10-CM | POA: Diagnosis not present

## 2017-07-13 DIAGNOSIS — E668 Other obesity: Secondary | ICD-10-CM | POA: Diagnosis not present

## 2017-07-13 DIAGNOSIS — M25551 Pain in right hip: Secondary | ICD-10-CM | POA: Diagnosis not present

## 2017-07-13 DIAGNOSIS — Z0000 Encounter for general adult medical examination without abnormal findings: Secondary | ICD-10-CM | POA: Diagnosis not present

## 2017-07-13 DIAGNOSIS — F418 Other specified anxiety disorders: Secondary | ICD-10-CM | POA: Diagnosis not present

## 2017-07-13 DIAGNOSIS — Z1389 Encounter for screening for other disorder: Secondary | ICD-10-CM | POA: Diagnosis not present

## 2017-07-13 DIAGNOSIS — E1169 Type 2 diabetes mellitus with other specified complication: Secondary | ICD-10-CM | POA: Diagnosis not present

## 2017-07-13 DIAGNOSIS — I2510 Atherosclerotic heart disease of native coronary artery without angina pectoris: Secondary | ICD-10-CM | POA: Diagnosis not present

## 2017-07-13 DIAGNOSIS — J45998 Other asthma: Secondary | ICD-10-CM | POA: Diagnosis not present

## 2017-07-13 DIAGNOSIS — E611 Iron deficiency: Secondary | ICD-10-CM | POA: Insufficient documentation

## 2017-07-18 DIAGNOSIS — Z1212 Encounter for screening for malignant neoplasm of rectum: Secondary | ICD-10-CM | POA: Diagnosis not present

## 2017-07-19 ENCOUNTER — Ambulatory Visit: Payer: PPO | Admitting: Nurse Practitioner

## 2017-07-19 ENCOUNTER — Encounter: Payer: Self-pay | Admitting: Nurse Practitioner

## 2017-07-19 VITALS — BP 150/90 | HR 53 | Ht 69.0 in | Wt 193.8 lb

## 2017-07-19 DIAGNOSIS — Z951 Presence of aortocoronary bypass graft: Secondary | ICD-10-CM | POA: Diagnosis not present

## 2017-07-19 DIAGNOSIS — M5136 Other intervertebral disc degeneration, lumbar region: Secondary | ICD-10-CM | POA: Insufficient documentation

## 2017-07-19 DIAGNOSIS — E785 Hyperlipidemia, unspecified: Secondary | ICD-10-CM | POA: Diagnosis not present

## 2017-07-19 DIAGNOSIS — M5416 Radiculopathy, lumbar region: Secondary | ICD-10-CM | POA: Diagnosis not present

## 2017-07-19 NOTE — Patient Instructions (Addendum)
We will be checking the following labs today - NONE   Medication Instructions:    Continue with your current medicines.     Testing/Procedures To Be Arranged:  N/A  Follow-Up:   See me in 6 months.    Other Special Instructions:   N/A    If you need a refill on your cardiac medications before your next appointment, please call your pharmacy.   Call the [facility] office at [phone] if you have any questions, problems or concerns.      

## 2017-07-19 NOTE — Progress Notes (Signed)
CARDIOLOGY OFFICE NOTE  Date:  07/19/2017    Hunter Mcbride. Date of Birth: Jun 28, 1945 Medical Record #[id number]  PCP:  Hunter Infante, MD  Cardiologist:  [facility]  Chief Complaint  Patient presents with  . Coronary Artery Disease    4 month check - seen for Dr. Burt Mcbride    History of Present Illness: Hunter Mcbride. is a 72 y.o. male who presents today for a follow up visit. Seen for Dr. Burt Mcbride.   He has a history of CAD, HTN, HLD, DM and OSA. He has had prior DES x 3 to the LAD back in 2015. Several caths since. Intolerant to higher doses of nitrates despite multiple attempts. Had not wanted to increase Ranexa due to cost. Has had chronic tendency to really overexert himself. It was surprising how very well his exercise tolerance is and that his angina is typically always at rest - this is chronic. He has had prior echo showing mild dilatation of the aortawith repeat studies was not identified.   He was cathedback in [place] 2018after having prolonged episodes of chest pain - had angioplasty at that time to a severe stenosis in the proximal PDA that was small to moderate caliber. Had DES x 1 to the proximal posterolateral artery.On return visit back in May still having some symptoms - discussed with Dr. Burt Mcbride and planned to have pressure wire analysis of all the coronaries.   He got admitted back at the end of September of 2018 -againhaving angina - again after exertion - his typical presentationwhich has chronically seemed atypical for angina- referred on for CABG x 5 - this was done by Hunter Mcbride - uneventful post op course noted.  I last saw in November. Saw Dr. Burt Mcbride in February. Some atypical chest pains but has used NTG again on occasion but overall was felt to be doing ok.   Comes in today. Here with his wife Hunter Mcbride.Asking if his scar will ever go away - planning on going to the beach. On more diabetic medicine - can't afford - on samples for 3  months. First denied chest pain - then notes that he had some spell 2 weeks ago. Some burning in his back and then in his upper chest - but now fine. Occurred at rest. Had been working/mowing/tilling prior to this. "Brought back memories" of the past. Otherwise has been ok. The whole episode was off and on for 2 days or so. Seemed to be more fleeting - certainly did not slow him down at all. No NTG taken. Didn't even tell Hunter Mcbride. He has had to have prednisone for some back pain - A1C has gone up. Now on some insulin - off Metformin now due to belly pain. BP has been much better at home. Concern for low iron levels - seeing GI for possible EGD/colonscopy.   Past Medical History:  Diagnosis Date  . Arthritis    "lower back; right shoulder; left thumb; joints" (06/21/2013)  . Asthma    "not sure if this is true or not" (06/21/2013)  . CAD (coronary artery disease) May 2015   s/p PTCA/DES x 3 to mid LAD, mod non-ob disease in Cx and RCA May 2015; s/p repeat caths - last study in November of 2015 - stents patent, - FFR - managed medically.   [name] GERD (gastroesophageal reflux disease)   . Gout    "maybe twice in my life"  . Headache(784.0)    "weekly for the  last 3-4 months" (06/21/2013)  . Hyperlipidemia   . Hypertension   . Myocardial infarction Hunter Mcbride)    "/Dr. Katharina Mcbride I've had one between 2014-2015) (06/21/2013)  . Osteoarthritis   . Sleep apnea    WEIGHT LOSS, NO LONGER NEEDS PER PATIENT  . Type II diabetes mellitus (Hunter Mcbride)    TYPE 2    Past Surgical History:  Procedure Laterality Date  . CARDIAC CATHETERIZATION  1980's   "once"  . CARDIAC CATHETERIZATION N/A 07/23/2015   Procedure: Left Heart Cath and Coronary Angiography;  Surgeon: Hunter Mocha, MD;  Location: [facility];  Service: Cardiovascular;  Laterality: N/A;  . CARDIOVASCULAR STRESS TEST  06/10/2008   EF 68%  . CARPAL TUNNEL RELEASE Bilateral   . CORONARY ANGIOPLASTY WITH STENT PLACEMENT  06/21/2013   "3"  . CORONARY ARTERY  BYPASS GRAFT N/A 11/07/2016   Procedure: CORONARY ARTERY BYPASS GRAFTING (CABG) x 5 (LIMA to DISTAL LAD, SVG to DIAGONAL, SVG to CIRCUMFLEX, and SVG SEQUENTIALLY to PLB and DISTAL PDA) with EVH of the RIGHT GREATER SAPHENOUS VEIN and LEFT INTERNAL MAMMARY ARTERY HARVEST;  Surgeon: Hunter Isaac, MD;  Location: [facility];  Service: Open Heart Surgery;  Laterality: N/A;  . CORONARY STENT INTERVENTION N/A 06/07/2016   Procedure: Coronary Stent Intervention;  Surgeon: Burnell Blanks, MD;  Location: [facility];  Service: Cardiovascular;  Laterality: N/A;  . EXCISION MORTON'S NEUROMA Left   . EYE SURGERY Left    "removed film over my eye"  . FRACTIONAL FLOW RESERVE WIRE N/A 06/26/2013   Procedure: FRACTIONAL FLOW RESERVE WIRE;  Surgeon: [name], MD;  Location: [facility];  Service: Cardiovascular;  Laterality: N/A;  . HERNIA REPAIR Left   . INTRAVASCULAR PRESSURE WIRE/FFR STUDY N/A 10/28/2016   Procedure: INTRAVASCULAR PRESSURE WIRE/FFR STUDY;  Surgeon: Leonie Man, MD;  Location: [facility];  Service: Cardiovascular;  Laterality: N/A;  . JOINT REPLACEMENT Left 03/2013   "thumb"  . LEFT HEART CATH AND CORONARY ANGIOGRAPHY N/A 06/07/2016   Procedure: Left Heart Cath and Coronary Angiography;  Surgeon: Burnell Blanks, MD;  Location: [facility];  Service: Cardiovascular;  Laterality: N/A;  . LEFT HEART CATH AND CORONARY ANGIOGRAPHY N/A 10/28/2016   Procedure: LEFT HEART CATH AND CORONARY ANGIOGRAPHY;  Surgeon: Leonie Man, MD;  Location: [facility];  Service: Cardiovascular;  Laterality: N/A;  . LEFT HEART CATHETERIZATION WITH CORONARY ANGIOGRAM N/A 06/21/2013   Procedure: LEFT HEART CATHETERIZATION WITH CORONARY ANGIOGRAM;  Surgeon: Burnell Blanks, MD;  Location: [facility];  Service: Cardiovascular;  Laterality: N/A;  . LEFT HEART CATHETERIZATION WITH CORONARY ANGIOGRAM N/A 06/26/2013   Procedure: LEFT HEART CATHETERIZATION WITH  CORONARY ANGIOGRAM;  Surgeon: [name], MD;  Location: [facility];  Service: Cardiovascular;  Laterality: N/A;  . TEE WITHOUT CARDIOVERSION N/A 11/07/2016   Procedure: TRANSESOPHAGEAL ECHOCARDIOGRAM (TEE);  Surgeon: Hunter Isaac, MD;  Location: [facility];  Service: Open Heart Surgery;  Laterality: N/A;  . TONSILLECTOMY    . TOTAL HIP ARTHROPLASTY Right 04/12/2016   Procedure: RIGHT TOTAL HIP ARTHROPLASTY ANTERIOR APPROACH;  Surgeon: Paralee Cancel, MD;  Location: [facility];  Service: Orthopedics;  Laterality: Right;  requests 70 mins  . TOTAL SHOULDER ARTHROPLASTY  03/01/2012   Procedure: TOTAL SHOULDER ARTHROPLASTY;  Surgeon: Marin Shutter, MD;  Location: [facility];  Service: Orthopedics;  Laterality: Right;  . UMBILICAL HERNIA REPAIR       Medications: Current Meds  Medication Sig  . acetaminophen (TYLENOL) 500 MG tablet Take 1,000 mg by mouth every 8 (eight) hours as needed (pain).   [name] ALPRAZolam (XANAX) 0.5 MG tablet Take 0.5 tablets (0.25 mg total) by mouth daily as needed for anxiety.  [name] amLODipine (NORVASC) 5 MG tablet Take 5 mg by mouth daily.  [name] aspirin 81 MG tablet Take 81 mg by mouth at bedtime.   [name] atenolol (TENORMIN) 50 MG tablet Take 1 tablet (50 mg total) by mouth at bedtime.  [name] atorvastatin (LIPITOR) 80 MG tablet Take 80 mg by mouth daily at 6 PM.  . canagliflozin (INVOKANA) 300 MG TABS tablet Take 300 mg by mouth daily before breakfast.  . Cholecalciferol (VITAMIN D PO) Take 1 capsule by mouth daily.  . clopidogrel (PLAVIX) 75 MG tablet Take 1 tablet (75 mg total) by mouth daily with breakfast.  . docusate sodium (COLACE) 100 MG capsule Take 100 mg by mouth daily as needed for mild constipation.  . gabapentin (NEURONTIN) 300 MG capsule Take 300 mg by mouth at bedtime.   . Insulin Degludec (TRESIBA [place]) Inject 10 Units into the skin every morning.  . irbesartan (AVAPRO) 150 MG tablet Take 1 tablet (150 mg total) daily by mouth.  . metFORMIN (GLUCOPHAGE-XR) 500 MG 24 hr  tablet Take 500 mg by mouth 2 (two) times daily.  . nitroGLYCERIN (NITROSTAT) 0.4 MG SL tablet Place 1 tablet (0.4 mg total) under the tongue every 5 (five) minutes as needed for chest pain.  . pantoprazole (PROTONIX) 40 MG tablet Take 40 mg by mouth 2 (two) times daily.  . polyethylene glycol (MIRALAX / GLYCOLAX) packet Take 17 g by mouth daily as needed for mild constipation.  . traMADol (ULTRAM) 50 MG tablet Take 1 tablet (50 mg total) by mouth every 6 (six) hours as needed for moderate pain.  . [DISCONTINUED] metFORMIN (GLUCOPHAGE-XR) 500 MG 24 hr tablet Take 500 mg by mouth 3 (three) times daily.     Allergies: Allergies  Allergen Reactions  . Oxycodone Nausea Only    Social History: The patient  reports that he has never smoked. He has never used smokeless tobacco. He reports that he does not drink alcohol or use drugs.   Family History: The patient's family history includes Coronary artery disease in his cousin; Diabetes type II in his mother; Heart attack in his father; Heart disease in his brother.   Review of Systems: Please see the history of present illness.   Otherwise, the review of systems is positive for none.   All other systems are reviewed and negative.   Physical Exam: VS:  BP (!) 150/90 (BP Location: Left Arm, Patient Position: Sitting, Cuff Size: Normal)   Pulse (!) 53   Ht 5\' 9"  (1.753 m)   Wt 193 lb 12.8 oz (87.9 kg)   BMI 28.62 kg/m  .  BMI Body mass index is 28.62 kg/m.  Wt Readings from Last 3 Encounters:  07/19/17 193 lb 12.8 oz (87.9 kg)  04/06/17 195 lb (88.5 kg)  01/19/17 186 lb (84.4 kg)    General: Pleasant. Well developed, well nourished and in no acute distress.   HEENT: Normal.  Neck: Supple, no JVD, carotid bruits, or masses noted.  Cardiac: Regular rate and rhythm. No murmurs, rubs, or gallops. No edema.  Respiratory:  Lungs are clear to auscultation bilaterally with normal work of breathing.  GI: Soft and nontender.  MS: No deformity  or atrophy. Gait and ROM intact.  Skin: Warm and  dry. Color is normal.  Neuro:  Strength and sensation are intact and no gross focal deficits noted.  Psych: Alert, appropriate and with normal affect.   LABORATORY DATA:  EKG:  EKG is ordered today. This demonstrates sinus brady with 1st degree AV block - prior inferior infarct.  Lab Results  Component Value Date   WBC 4.1 11/28/2016   HGB 10.5 (L) 11/28/2016   HCT 31.7 (L) 11/28/2016   PLT 240 11/28/2016   GLUCOSE 146 (H) 11/28/2016   CHOL 84 10/28/2016   TRIG 31 10/28/2016   HDL 36 (L) 10/28/2016   LDLCALC 42 10/28/2016   ALT 44 11/03/2016   AST 27 11/03/2016   NA 142 11/28/2016   K 5.3 (H) 11/28/2016   CL 105 11/28/2016   CREATININE 0.82 11/28/2016   BUN 13 11/28/2016   CO2 22 11/28/2016   TSH 2.009 06/06/2016   INR 1.51 11/07/2016   HGBA1C 7.5 (H) 11/03/2016       BNP (last 3 results) No results for input(s): BNP in the last 8760 hours.  ProBNP (last 3 results) No results for input(s): PROBNP in the last 8760 hours.   Other Studies Reviewed Today:  SURGICAL PROCEDURE9/2018:  Coronary artery bypass grafting x5 with the left internal mammary to the distal left anterior descending coronary artery, reverse saphenous vein graft to the diagonal, reverse saphenous vein graft to the first obtuse marginal, sequential reverse saphenous vein graft to the proximal posterior lateral branch and to the distal posterior descending with right leg, thigh, and calf greater saphenous vein harvesting endoscopically.  Assessment/Plan:  1. CAD with history of chronic angina - prior DES x 3 to the LAD, he has had PCI with DES x 1 to the proximal PLA and balloon PCI to the proximal PDA -now s/p CABG x 5 - doing well - he is almost one year out. Some recurrent atypical chest pain but they both feel that he is doing great. I have elected to not make any changes at this time.   2. HTN - reports better BP control at home.     3. HLD - on statintherapy - recent labs noted from his physical.   4. DM - followed by Dr. Joylene Draft. Now on insulin.   5. Hip pain - not discussed today  6. Low iron - ok to proceed with EGD/colonoscopy - would hold Plavix for 5 days if needed. Would stay on aspirin.   Current medicines are reviewed with the patient today.  The patient does not have concerns regarding medicines other than what has been noted above.  The following changes have been made:  See above.  Labs/ tests ordered today include:    Orders Placed This Encounter  Procedures  . EKG 12-Lead     Disposition:   FU with me in 6 months.   Patient is agreeable to this plan and will call if any problems develop in the interim.   SignedTruitt Merle, NP  07/19/2017 9:15 AM  [facility] [address] [place], [place]  [address] Phone: [phone] Fax: [phone]

## 2017-07-24 DIAGNOSIS — I10 Essential (primary) hypertension: Secondary | ICD-10-CM | POA: Diagnosis not present

## 2017-07-24 DIAGNOSIS — R51 Headache: Secondary | ICD-10-CM | POA: Diagnosis not present

## 2017-07-24 DIAGNOSIS — Z6828 Body mass index (BMI) 28.0-28.9, adult: Secondary | ICD-10-CM | POA: Diagnosis not present

## 2017-07-25 ENCOUNTER — Encounter: Payer: Self-pay | Admitting: Physician Assistant

## 2017-07-25 DIAGNOSIS — M5416 Radiculopathy, lumbar region: Secondary | ICD-10-CM | POA: Diagnosis not present

## 2017-07-26 ENCOUNTER — Other Ambulatory Visit: Payer: Self-pay

## 2017-07-26 MED ORDER — CLOPIDOGREL BISULFATE 75 MG PO TABS: 75.0000 mg | ORAL_TABLET | Freq: Every day | ORAL | 0 refills | Status: DC

## 2017-08-03 DIAGNOSIS — M5416 Radiculopathy, lumbar region: Secondary | ICD-10-CM | POA: Diagnosis not present

## 2017-08-04 ENCOUNTER — Encounter: Payer: Self-pay | Admitting: Physician Assistant

## 2017-08-04 ENCOUNTER — Ambulatory Visit: Payer: PPO | Admitting: Physician Assistant

## 2017-08-04 VITALS — BP 116/70 | HR 74 | Ht 69.0 in | Wt 193.1 lb

## 2017-08-04 DIAGNOSIS — Z7901 Long term (current) use of anticoagulants: Secondary | ICD-10-CM

## 2017-08-04 DIAGNOSIS — K5900 Constipation, unspecified: Secondary | ICD-10-CM

## 2017-08-04 DIAGNOSIS — D649 Anemia, unspecified: Secondary | ICD-10-CM | POA: Diagnosis not present

## 2017-08-04 DIAGNOSIS — R195 Other fecal abnormalities: Secondary | ICD-10-CM

## 2017-08-04 DIAGNOSIS — D508 Other iron deficiency anemias: Secondary | ICD-10-CM

## 2017-08-04 DIAGNOSIS — R1013 Epigastric pain: Secondary | ICD-10-CM

## 2017-08-04 DIAGNOSIS — R634 Abnormal weight loss: Secondary | ICD-10-CM | POA: Diagnosis not present

## 2017-08-04 MED ORDER — NA SULFATE-K SULFATE-MG SULF 17.5-3.13-1.6 GM/177ML PO SOLN: 1.0000 | ORAL | 0 refills | Status: DC

## 2017-08-04 MED ORDER — ESOMEPRAZOLE MAGNESIUM 40 MG PO CPDR: 40.0000 mg | DELAYED_RELEASE_CAPSULE | Freq: Two times a day (BID) | ORAL | 2 refills | Status: DC

## 2017-08-04 MED ORDER — RANITIDINE HCL 150 MG PO TABS: 150.0000 mg | ORAL_TABLET | Freq: Two times a day (BID) | ORAL | 2 refills | Status: DC

## 2017-08-04 NOTE — Progress Notes (Addendum)
Chief Complaint: IDA and abdominal pain  HPI:    Mr. Schlink is a 72 year old male with a past medical history of CAD (11/07/2016 echo with LVEF 50-55%), hypertension, hyperlipidemia, diabetes and OSA status post drug-eluting stent placement x3 to the LAD in 2015 on Plavix as well as recent coronary artery bypass grafting 11/07/2016, who was referred to me by Crist Infante, MD for a complaint of IDA and epigastric abdominal pain.    07/06/2017 hemoglobin is normal at 12.7, iron studies were normal.  Ferritin low at 18.6.  It appears patient is chronically anemic with a hemoglobin around 10 over the past 8 months.  (Apparently Hemoccult studies were positive as well we do not have records)    Today, explains that he has a 8-9/10 epigastric pain which feels like "something is eating a hole in there", it is burning, regardless of his Pantoprazole 40 mg twice daily which he has been on for forever.  Tells me he thinks this started when he was started on Metformin because every time he took this pill he had this feeling.  They have since decreased the dosage of this but he continues with pain now any time that he eats just about anything.  He finds himself eating sherbet at night just to "cool my stomach".  Associated symptoms included decrease in appetite and a weight loss due to this abdominal pain.    Also describes a change to constipation over the past 8 months or so.  He is currently using a mixture of MiraLAX and Colace on an almost daily basis in order to produce stools.  He has also used some suppositories to help.    Last colonoscopy 3-4 yrs ago per patient.    Denies fever, chills or symptoms that awaken him at night.     Past Medical History:  Diagnosis Date  . Arthritis    "lower back; right shoulder; left thumb; joints" (06/21/2013)  . Asthma    "not sure if this is true or not" (06/21/2013)  . CAD (coronary artery disease) May 2015   s/p PTCA/DES x 3 to mid LAD, mod non-ob disease in Cx and RCA  May 2015; s/p repeat caths - last study in November of 2015 - stents patent, - FFR - managed medically.   [name] GERD (gastroesophageal reflux disease)   . Gout    "maybe twice in my life"  . Headache(784.0)    "weekly for the last 3-4 months" (06/21/2013)  . Hyperlipidemia   . Hypertension   . Myocardial infarction [facility])    "/Dr. Katharina Caper I've had one between 2014-2015) (06/21/2013)  . Osteoarthritis   . Sleep apnea    WEIGHT LOSS, NO LONGER NEEDS PER PATIENT  . Type II diabetes mellitus ([place])    TYPE 2    Past Surgical History:  Procedure Laterality Date  . CARDIAC CATHETERIZATION  1980's   "once"  . CARDIAC CATHETERIZATION N/A 07/23/2015   Procedure: Left Heart Cath and Coronary Angiography;  Surgeon: Sherren Mocha, MD;  Location: [place] [facility];  Service: Cardiovascular;  Laterality: N/A;  . CARDIOVASCULAR STRESS TEST  06/10/2008   EF 68%  . CARPAL TUNNEL RELEASE Bilateral   . CORONARY ANGIOPLASTY WITH STENT PLACEMENT  06/21/2013   "3"  . CORONARY ARTERY BYPASS GRAFT N/A 11/07/2016   Procedure: CORONARY ARTERY BYPASS GRAFTING (CABG) x 5 (LIMA to DISTAL LAD, SVG to DIAGONAL, SVG to CIRCUMFLEX, and SVG SEQUENTIALLY to PLB and DISTAL PDA) with EVH of the RIGHT  GREATER SAPHENOUS VEIN and LEFT INTERNAL MAMMARY ARTERY HARVEST;  Surgeon: Grace Isaac, MD;  Location: [place];  Service: Open Heart Surgery;  Laterality: N/A;  . CORONARY STENT INTERVENTION N/A 06/07/2016   Procedure: Coronary Stent Intervention;  Surgeon: Burnell Blanks, MD;  Location: [place] [facility];  Service: Cardiovascular;  Laterality: N/A;  . EXCISION MORTON'S NEUROMA Left   . EYE SURGERY Left    "removed film over my eye"  . FRACTIONAL FLOW RESERVE WIRE N/A 06/26/2013   Procedure: FRACTIONAL FLOW RESERVE WIRE;  Surgeon: [name], MD;  Location: [place] CATH [facility];  Service: Cardiovascular;  Laterality: N/A;  . HERNIA REPAIR Left   . INTRAVASCULAR PRESSURE WIRE/FFR STUDY N/A 10/28/2016   Procedure:  INTRAVASCULAR PRESSURE WIRE/FFR STUDY;  Surgeon: Leonie Man, MD;  Location: [place] [facility];  Service: Cardiovascular;  Laterality: N/A;  . JOINT REPLACEMENT Left 03/2013   "thumb"  . LEFT HEART CATH AND CORONARY ANGIOGRAPHY N/A 06/07/2016   Procedure: Left Heart Cath and Coronary Angiography;  Surgeon: Burnell Blanks, MD;  Location: [place] [facility];  Service: Cardiovascular;  Laterality: N/A;  . LEFT HEART CATH AND CORONARY ANGIOGRAPHY N/A 10/28/2016   Procedure: LEFT HEART CATH AND CORONARY ANGIOGRAPHY;  Surgeon: Leonie Man, MD;  Location: [place] [facility];  Service: Cardiovascular;  Laterality: N/A;  . LEFT HEART CATHETERIZATION WITH CORONARY ANGIOGRAM N/A 06/21/2013   Procedure: LEFT HEART CATHETERIZATION WITH CORONARY ANGIOGRAM;  Surgeon: Burnell Blanks, MD;  [facility];  Service: Cardiovascular;  Laterality: N/A;  . LEFT HEART CATHETERIZATION WITH CORONARY ANGIOGRAM N/A 06/26/2013   Procedure: LEFT HEART CATHETERIZATION WITH CORONARY ANGIOGRAM;  Surgeon: [name], MD;  Location: [place] CATH LAB;  Service: Cardiovascular;  Laterality: N/A;  . TEE WITHOUT CARDIOVERSION N/A 11/07/2016   Procedure: TRANSESOPHAGEAL ECHOCARDIOGRAM (TEE);  Surgeon: Grace Isaac, MD;  Location: [place];  Service: Open Heart Surgery;  Laterality: N/A;  . TONSILLECTOMY    . TOTAL HIP ARTHROPLASTY Right 04/12/2016   Procedure: RIGHT TOTAL HIP ARTHROPLASTY ANTERIOR APPROACH;  Surgeon: Paralee Cancel, MD;  Location: [facility];  Service: Orthopedics;  Laterality: Right;  requests 70 mins  . TOTAL SHOULDER ARTHROPLASTY  03/01/2012   Procedure: TOTAL SHOULDER ARTHROPLASTY;  Surgeon: Marin Shutter, MD;  Location: [place];  Service: Orthopedics;  Laterality: Right;  . UMBILICAL HERNIA REPAIR      Current Outpatient Medications  Medication Sig Dispense Refill  . acetaminophen (TYLENOL) 500 MG tablet Take 1,000 mg by mouth every 8 (eight) hours as needed (pain).     [name] ALPRAZolam  (XANAX) 0.5 MG tablet Take 0.5 tablets (0.25 mg total) by mouth daily as needed for anxiety.  0  . amLODipine (NORVASC) 5 MG tablet Take 5 mg by mouth daily.    [name] aspirin 81 MG tablet Take 81 mg by mouth at bedtime.     [name] atenolol (TENORMIN) 50 MG tablet Take 1 tablet (50 mg total) by mouth at bedtime. 90 tablet 3  . atorvastatin (LIPITOR) 80 MG tablet Take 80 mg by mouth daily at 6 PM.    . canagliflozin (INVOKANA) 300 MG TABS tablet Take 300 mg by mouth daily before breakfast.    . Cholecalciferol (VITAMIN D PO) Take 1 capsule by mouth daily.    . clopidogrel (PLAVIX) 75 MG tablet Take 1 tablet (75 mg total) by mouth daily with breakfast. 90 tablet 0  . docusate sodium (COLACE) 100 MG capsule Take 100  mg by mouth daily as needed for mild constipation.    . gabapentin (NEURONTIN) 300 MG capsule Take 300 mg by mouth at bedtime.     . Insulin Degludec (TRESIBA [place]) Inject 10 Units into the skin every morning.    . irbesartan (AVAPRO) 150 MG tablet Take 1 tablet (150 mg total) daily by mouth. 30 tablet 11  . metFORMIN (GLUCOPHAGE-XR) 500 MG 24 hr tablet Take 500 mg by mouth 2 (two) times daily.    . nitroGLYCERIN (NITROSTAT) 0.4 MG SL tablet Place 1 tablet (0.4 mg total) under the tongue every 5 (five) minutes as needed for chest pain. 25 tablet 3  . pantoprazole (PROTONIX) 40 MG tablet Take 40 mg by mouth 2 (two) times daily.    . polyethylene glycol (MIRALAX / GLYCOLAX) packet Take 17 g by mouth daily as needed for mild constipation.    . traMADol (ULTRAM) 50 MG tablet Take 1 tablet (50 mg total) by mouth every 6 (six) hours as needed for moderate pain. 30 tablet 0   No current facility-administered medications for this visit.     Allergies as of 08/04/2017 - Review Complete 08/04/2017  Allergen Reaction Noted  . Oxycodone Nausea Only 12/08/2016    Family History  Problem Relation Age of Onset  . Diabetes type II Mother   . Heart attack Father   . Coronary artery disease Cousin   .  Heart disease Brother     Social History   Socioeconomic History  . Marital status: Married    Spouse name: Not on file  . Number of children: Not on file  . Years of education: Not on file  . Highest education level: Not on file  Occupational History  . Not on file  Social Needs  . Financial resource strain: Not on file  . Food insecurity:    Worry: Not on file    Inability: Not on file  . Transportation needs:    Medical: Not on file    Non-medical: Not on file  Tobacco Use  . Smoking status: Never Smoker  . Smokeless tobacco: Never Used  Substance and Sexual Activity  . Alcohol use: No  . Drug use: No  . Sexual activity: Yes  Lifestyle  . Physical activity:    Days per week: Not on file    Minutes per session: Not on file  . Stress: Not on file  Relationships  . Social connections:    Talks on phone: Not on file    Gets together: Not on file    Attends religious service: Not on file    Active member of club or organization: Not on file    Attends meetings of clubs or organizations: Not on file    Relationship status: Not on file  . Intimate partner violence:    Fear of current or ex partner: Not on file    Emotionally abused: Not on file    Physically abused: Not on file    Forced sexual activity: Not on file  Other Topics Concern  . Not on file  Social History Narrative  . Not on file    Review of Systems:    Constitutional: No weight loss, fever or chills Skin: No rash  Cardiovascular: No chest pain Respiratory: No SOB  Gastrointestinal: See HPI and otherwise negative Genitourinary: No dysuria  Neurological: No headache, dizziness or syncope Musculoskeletal: No new muscle or joint pain Hematologic: No bleeding  Psychiatric: No history of depression or anxiety  Physical Exam:  Vital signs: BP 116/70   Pulse 74   Ht 5\' 9"  (1.753 m)   Wt 193 lb 2 oz (87.6 kg)   BMI 28.52 kg/m    Constitutional:   Pleasant Caucasian male appears to be in NAD,  Well developed, Well nourished, alert and cooperative Head:  Normocephalic and atraumatic. Eyes:   PEERL, EOMI. No icterus. Conjunctiva pink. Ears:  Normal auditory acuity. Neck:  Supple Throat: Oral cavity and pharynx without inflammation, swelling or lesion.  Respiratory: Respirations even and unlabored. Lungs clear to auscultation bilaterally.   No wheezes, crackles, or rhonchi.  Cardiovascular: Normal S1, S2. No MRG. Regular rate and rhythm. No peripheral edema, cyanosis or pallor.  Gastrointestinal:  Soft, nondistended, moderate epigastric ttp. No rebound or guarding. Normal bowel sounds. No appreciable masses or hepatomegaly. Rectal:  Not performed.  Msk:  Symmetrical without gross deformities. Without edema, no deformity or joint abnormality.  Neurologic:  Alert and  oriented x4;  grossly normal neurologically.  Skin:   Dry and intact without significant lesions or rashes. Psychiatric: Demonstrates good judgement and reason without abnormal affect or behaviors.  See HPI for recent labs.  Assessment: 1.  Epigastric abdominal pain: Gnawing/ burning, no relief with Pantoprazole 40 mg twice daily over the past 6 to 8 months; consider PUD versus gastritis plus/minus H. pylori 2.  Anemia: Hemoglobin has remained around 10 until recently when this was normal, ferritin low recently; consider GI source of blood loss 3.  Weight loss: Per patient, records show he has remained stable/gained weight since August of last year 4.  Positive Hemoccult 5.  Chronic anticoagulation status post CABG 8/18: With Plavix 6. constipation  Plan: 1.  Patient will need an EGD and colonoscopy for further evaluation.  This will be scheduled with Dr. Loletha Carrow in the [facility].  Did discuss risk, benefits, limitations and alternatives. 2.  Advised the patient hold his Plavix for 5 days prior to time of procedures.  We will need to communicate with his cardiologist to ensure that holding his Plavix is acceptable for him.  It  has been 10 months since his CABG, they may want to wait a couple of months.  Likely this procedure can be scheduled in August. 3.  Stopped Protonix 4.  Started Nexium 40 mg twice daily, 30-60 minutes before eating breakfast and dinner #60 with 3 refills 5.  Start Ranitidine 150 mg every morning and nightly #60 with 3 refills 6.  Started Linzess 145 mcg for constipation. Provided samples. D/C miralax for now. 7.  Patient to follow in clinic per recommendations from Dr. Loletha Carrow after time of procedures.  Ellouise Newer, PA-C [facility] 08/04/2017, 1:36 PM  Cc: Crist Infante, MD   Addendum: 08/15/2017 1325  08/10/2017 CBC with a hemoglobin of 13.3, ferritin low at 15.9, iron studies normal.  Transferrin normal.  Ellouise Newer, PA-C

## 2017-08-04 NOTE — Patient Instructions (Signed)
You have been scheduled for an endoscopy and colonoscopy. Please follow the written instructions given to you at your visit today. Please pick up your prep supplies at the pharmacy within the next 1-3 days. If you use inhalers (even only as needed), please bring them with you on the day of your procedure. Your physician has requested that you go to www.startemmi.com and enter the access code given to you at your visit today. This web site gives a general overview about your procedure. However, you should still follow specific instructions given to you by our office regarding your preparation for the procedure.  Stop Pantoprazole   Start Nexium 40 mg twice a day   Start Ranitidine 150 mg twice a day

## 2017-08-05 NOTE — Progress Notes (Signed)
Thank you for sending this case to me. I have reviewed the entire note, and the outlined plan seems appropriate.  His EGD (and biopsies if necessary) can be done on plavix if cardiology feels too high risk to stop it 5 days prior.  Wilfrid Lund, MD

## 2017-08-08 ENCOUNTER — Telehealth: Payer: Self-pay | Admitting: Emergency Medicine

## 2017-08-08 NOTE — Telephone Encounter (Signed)
[facility] Pre-operative Risk Assessment     Request for surgical clearance:     Endoscopy Procedure  What type of surgery is being performed?     Endoscopy and colonoscopy  When is this surgery scheduled?     09-14-17  What type of clearance is required ?   Pharmacy  Are there any medications that need to be held prior to surgery and how Yepiz? Plavix 5 days  Practice name and name of physician performing surgery?      [facility]  What is your office phone and fax number?      Phone- [phone]  [phone]  Anesthesia type (None, local, MAC, general) ?       MAC

## 2017-08-09 NOTE — Telephone Encounter (Signed)
   Primary Cardiologist: Sherren Mocha, MD  Chart reviewed as part of pre-operative protocol coverage. Hunter Mcbride. was last seen on 6/5 by Truitt Merle, NP. H/o CAD s/p DESx3 to LAD 2015, DES to prox PLA 05/2016, CABG x5 in 10/2016 (troponins neg), HTN, HLD, DM and OSA.  Doing well at last OV with [name] - per her note 07/19/17, "ok to proceed with EGD/colonoscopy - would hold Plavix for 5 days if needed. Would stay on aspirin."  Will route this bundled recommendation to requesting provider via Epic fax function. Please call with questions.  [name], PA-C 08/09/2017, 3:32 PM

## 2017-08-10 ENCOUNTER — Telehealth: Payer: Self-pay | Admitting: Emergency Medicine

## 2017-08-10 DIAGNOSIS — E611 Iron deficiency: Secondary | ICD-10-CM | POA: Diagnosis not present

## 2017-08-10 DIAGNOSIS — D649 Anemia, unspecified: Secondary | ICD-10-CM | POA: Diagnosis not present

## 2017-08-10 NOTE — Telephone Encounter (Signed)
Spoke to patients wife and informed her to hold Plavix 5 days before procedure but to stay on Aspirin in the interim. She will inform her husband.

## 2017-08-10 NOTE — Telephone Encounter (Signed)
Spoke to patients wife and she states Linzess 145 mcg is not helping. Should he increase to the next dose?

## 2017-08-10 NOTE — Telephone Encounter (Signed)
Yes, please have him take 290 mcg qd. Thanks-JLL

## 2017-08-10 NOTE — Telephone Encounter (Signed)
Left detailed message and samples left at the front desk.

## 2017-08-14 DIAGNOSIS — M5416 Radiculopathy, lumbar region: Secondary | ICD-10-CM | POA: Diagnosis not present

## 2017-08-21 DIAGNOSIS — M5416 Radiculopathy, lumbar region: Secondary | ICD-10-CM | POA: Diagnosis not present

## 2017-08-28 ENCOUNTER — Telehealth: Payer: Self-pay | Admitting: Physician Assistant

## 2017-08-28 ENCOUNTER — Other Ambulatory Visit: Payer: PPO

## 2017-08-28 ENCOUNTER — Other Ambulatory Visit (INDEPENDENT_AMBULATORY_CARE_PROVIDER_SITE_OTHER): Payer: PPO

## 2017-08-28 DIAGNOSIS — R1013 Epigastric pain: Secondary | ICD-10-CM | POA: Diagnosis not present

## 2017-08-28 LAB — COMPREHENSIVE METABOLIC PANEL
ALK PHOS: 85 U/L (ref 39–117)
ALT: 37 U/L (ref 0–53)
AST: 19 U/L (ref 0–37)
Albumin: 4.2 g/dL (ref 3.5–5.2)
BILIRUBIN TOTAL: 0.6 mg/dL (ref 0.2–1.2)
BUN: 24 mg/dL — ABNORMAL HIGH (ref 6–23)
CO2: 30 mEq/L (ref 19–32)
Calcium: 9.8 mg/dL (ref 8.4–10.5)
Chloride: 103 mEq/L (ref 96–112)
Creatinine, Ser: 1.22 mg/dL (ref 0.40–1.50)
GFR: 62.27 mL/min (ref >60.00)
GLUCOSE: 253 mg/dL — AB (ref 70–99)
Potassium: 5 mEq/L (ref 3.5–5.1)
Sodium: 141 mEq/L (ref 135–145)
TOTAL PROTEIN: 6.8 g/dL (ref 6.0–8.3)

## 2017-08-28 LAB — LIPASE: LIPASE: 32 U/L (ref 11.0–59.0)

## 2017-08-28 LAB — CBC WITH DIFFERENTIAL/PLATELET
BASOS ABS: 0 10*3/uL (ref 0.0–0.1)
Basophils Relative: 0.5 % (ref 0.0–3.0)
Eosinophils Absolute: 0 10*3/uL (ref 0.0–0.7)
Eosinophils Relative: 0.7 % (ref 0.0–5.0)
HEMATOCRIT: 39.5 % (ref 39.0–52.0)
Hemoglobin: 13.1 g/dL (ref 13.0–17.0)
Lymphocytes Relative: 30.2 % (ref 12.0–46.0)
Lymphs Abs: 1.5 10*3/uL (ref 0.7–4.0)
MCHC: 33.3 g/dL (ref 30.0–36.0)
MCV: 76.6 fl — AB (ref 78.0–100.0)
MONOS PCT: 7.1 % (ref 3.0–12.0)
Monocytes Absolute: 0.4 10*3/uL (ref 0.1–1.0)
Neutro Abs: 3 10*3/uL (ref 1.4–7.7)
Neutrophils Relative %: 61.5 % (ref 43.0–77.0)
Platelets: 156 10*3/uL (ref 150.0–400.0)
RBC: 5.16 Mil/uL (ref 4.22–5.81)
RDW: 19.8 % — ABNORMAL HIGH (ref 11.5–15.5)
WBC: 4.9 10*3/uL (ref 4.0–10.5)

## 2017-08-28 NOTE — Telephone Encounter (Signed)
The patient has been notified of this information and all questions answered.   The pt will come in today for labs   You have been scheduled for an abdominal ultrasound at [facility] (1st floor of hospital) on 08/30/17 at 11 am. Please arrive 15 minutes prior to your appointment for registration. Make certain not to have anything to eat or drink 6 hours prior to your appointment. Should you need to reschedule your appointment, please contact radiology at [phone]. This test typically takes about 30 minutes to perform.

## 2017-08-28 NOTE — Telephone Encounter (Signed)
Pt still dealing with abd pain. Last night almost went to ED because pain was severe. He needs some advise on what else to do.

## 2017-08-28 NOTE — Telephone Encounter (Signed)
Hunter Mcbride the pt saw you on 6/21 and was scheduled for endo colon for epigastric pain.  He has since had several episodes of epigastric pain that radiates to the back and feels like a band around his upper abd.  He states the pain started after eating a hamburger and fries on one occasion and chicken livers yesterday. He is suppose to be going to the beach on Friday and wants to know if this could be gallbladder?? Can we order an [place] to evaluate for gallstones?

## 2017-08-28 NOTE — Telephone Encounter (Signed)
Yes, please order RUQ u/s and CBC/CMP and Lipase- JLL

## 2017-08-30 ENCOUNTER — Ambulatory Visit (HOSPITAL_COMMUNITY)
Admission: RE | Admit: 2017-08-30 | Discharge: 2017-08-30 | Disposition: A | Discharge status: Home / Self Care | Payer: PPO | Source: Ambulatory Visit | Attending: Physician Assistant | Admitting: Physician Assistant

## 2017-08-30 DIAGNOSIS — R109 Unspecified abdominal pain: Secondary | ICD-10-CM | POA: Diagnosis not present

## 2017-08-30 DIAGNOSIS — R1013 Epigastric pain: Secondary | ICD-10-CM | POA: Diagnosis not present

## 2017-08-30 DIAGNOSIS — N289 Disorder of kidney and ureter, unspecified: Secondary | ICD-10-CM | POA: Diagnosis not present

## 2017-08-31 ENCOUNTER — Encounter: Payer: Self-pay | Admitting: Gastroenterology

## 2017-09-14 ENCOUNTER — Encounter: Payer: Self-pay | Admitting: Gastroenterology

## 2017-09-14 ENCOUNTER — Ambulatory Visit (AMBULATORY_SURGERY_CENTER): Payer: PPO | Admitting: Gastroenterology

## 2017-09-14 ENCOUNTER — Encounter

## 2017-09-14 VITALS — BP 142/86 | HR 52 | Temp 99.1°F | Resp 15 | Ht 69.0 in | Wt 193.0 lb

## 2017-09-14 DIAGNOSIS — D649 Anemia, unspecified: Secondary | ICD-10-CM | POA: Diagnosis not present

## 2017-09-14 DIAGNOSIS — D122 Benign neoplasm of ascending colon: Secondary | ICD-10-CM | POA: Diagnosis not present

## 2017-09-14 DIAGNOSIS — R1013 Epigastric pain: Secondary | ICD-10-CM

## 2017-09-14 DIAGNOSIS — R195 Other fecal abnormalities: Secondary | ICD-10-CM

## 2017-09-14 DIAGNOSIS — K5730 Diverticulosis of large intestine without perforation or abscess without bleeding: Secondary | ICD-10-CM

## 2017-09-14 DIAGNOSIS — K3189 Other diseases of stomach and duodenum: Secondary | ICD-10-CM | POA: Diagnosis not present

## 2017-09-14 DIAGNOSIS — D123 Benign neoplasm of transverse colon: Secondary | ICD-10-CM

## 2017-09-14 DIAGNOSIS — J45909 Unspecified asthma, uncomplicated: Secondary | ICD-10-CM | POA: Diagnosis not present

## 2017-09-14 DIAGNOSIS — G4730 Sleep apnea, unspecified: Secondary | ICD-10-CM | POA: Diagnosis not present

## 2017-09-14 DIAGNOSIS — K635 Polyp of colon: Secondary | ICD-10-CM | POA: Diagnosis not present

## 2017-09-14 DIAGNOSIS — D508 Other iron deficiency anemias: Secondary | ICD-10-CM

## 2017-09-14 DIAGNOSIS — M109 Gout, unspecified: Secondary | ICD-10-CM | POA: Diagnosis not present

## 2017-09-14 DIAGNOSIS — R634 Abnormal weight loss: Secondary | ICD-10-CM | POA: Diagnosis not present

## 2017-09-14 DIAGNOSIS — I2510 Atherosclerotic heart disease of native coronary artery without angina pectoris: Secondary | ICD-10-CM | POA: Diagnosis not present

## 2017-09-14 DIAGNOSIS — I10 Essential (primary) hypertension: Secondary | ICD-10-CM | POA: Diagnosis not present

## 2017-09-14 DIAGNOSIS — E119 Type 2 diabetes mellitus without complications: Secondary | ICD-10-CM | POA: Diagnosis not present

## 2017-09-14 MED ORDER — SODIUM CHLORIDE 0.9 % IV SOLN: 500.0000 mL | Freq: Once | INTRAVENOUS | Status: DC

## 2017-09-14 NOTE — Progress Notes (Signed)
Called to room to assist during endoscopic procedure.  Patient ID and intended procedure confirmed with present staff. Received instructions for my participation in the procedure from the performing physician.  

## 2017-09-14 NOTE — Op Note (Signed)
[facility] Patient Name: Hunter Mcbride Procedure Date: 09/14/2017 2:41 PM MRN: [id number] Endoscopist: [place]. Loletha Carrow , MD Age: 72 Referring MD:  Date of Birth: Feb 27, 1945 Gender: Male Account #: [id number] Procedure:                Upper GI endoscopy Indications:              Epigastric abdominal pain, Heme positive stool                            (reported by PCP). Iron deficiency (ferritin 16)                            with normal hemoglobin. CTA chest/abdomen 2018                            without celiac or mesenteric stenosis Medicines:                Monitored Anesthesia Care Procedure:                Pre-Anesthesia Assessment:                           - Prior to the procedure, a History and Physical                            was performed, and patient medications and                            allergies were reviewed. The patient's tolerance of                            previous anesthesia was also reviewed. The risks                            and benefits of the procedure and the sedation                            options and risks were discussed with the patient.                            All questions were answered, and informed consent                            was obtained. Prior Anticoagulants: The patient                            last took aspirin 1 day and Plavix (clopidogrel) 5                            days prior to the procedure. ASA Grade Assessment:                            III - A patient with severe systemic disease. After  reviewing the risks and benefits, the patient was                            deemed in satisfactory condition to undergo the                            procedure.                           After obtaining informed consent, the endoscope was                            passed under direct vision. Throughout the                            procedure, the patient's blood pressure, pulse, and                      oxygen saturations were monitored continuously. The                            Endoscope was introduced through the mouth, and                            advanced to the second part of duodenum. The upper                            GI endoscopy was accomplished without difficulty.                            The patient tolerated the procedure well. Scope In: Scope Out: Findings:                 The esophagus was normal.                           The entire examined stomach was normal. Biopsies                            were taken with a cold forceps for histology.                            (Sidney protocol).                           The cardia and gastric fundus were normal on                            retroflexion.                           The examined duodenum was normal. Complications:            No immediate complications. Estimated Blood Loss:     Estimated blood loss was minimal. Impression:               - Normal esophagus.                           -  Normal stomach. Biopsied.                           - Normal examined duodenum. Recommendation:           - Patient has a contact number available for                            emergencies. The signs and symptoms of potential                            delayed complications were discussed with the                            patient. Return to normal activities tomorrow.                            Written discharge instructions were provided to the                            patient.                           - Resume previous diet.                           - Continue present medications.                           - Await pathology results.                           - See the other procedure note for documentation of                            additional recommendations. Henry L. Loletha Carrow, MD 09/14/2017 3:23:37 PM This report has been signed electronically.

## 2017-09-14 NOTE — Op Note (Signed)
[place] Patient Name: Hunter Mcbride Procedure Date: 09/14/2017 2:41 PM MRN: [id number] Endoscopist: [employer]. Loletha Carrow , MD Age: 72 Referring MD:  Date of Birth: 1945-05-24 Gender: Male Account #: [id number] Procedure:                Colonoscopy Indications:              Heme positive stool (reported by PCP). Low ferritin                            of 16 with normal hemoglobin. Chronic constipation. Medicines:                Monitored Anesthesia Care Procedure:                Pre-Anesthesia Assessment:                           - Prior to the procedure, a History and Physical                            was performed, and patient medications and                            allergies were reviewed. The patient's tolerance of                            previous anesthesia was also reviewed. The risks                            and benefits of the procedure and the sedation                            options and risks were discussed with the patient.                            All questions were answered, and informed consent                            was obtained. Prior Anticoagulants: The patient                            last took aspirin 1 day and Pradaxa (dabigatran) 5                            days prior to the procedure. ASA Grade Assessment:                            III - A patient with severe systemic disease. After                            reviewing the risks and benefits, the patient was                            deemed in satisfactory condition to undergo the  procedure.                           After obtaining informed consent, the colonoscope                            was passed under direct vision. Throughout the                            procedure, the patient's blood pressure, pulse, and                            oxygen saturations were monitored continuously. The                            Colonoscope was introduced through the  anus and                            advanced to the the cecum, identified by                            appendiceal orifice and ileocecal valve. The                            colonoscopy was performed without difficulty. The                            patient tolerated the procedure well. The quality                            of the bowel preparation was good after lavage. The                            ileocecal valve, appendiceal orifice, and rectum                            were photographed. Scope In: 2:57:57 PM Scope Out: 3:16:01 PM Scope Withdrawal Time: 0 hours 15 minutes 7 seconds  Total Procedure Duration: 0 hours 18 minutes 4 seconds  Findings:                 The perianal and digital rectal examinations were                            normal.                           Multiple medium-mouthed diverticula were found in                            the left colon and right colon.                           Four sessile polyps were found in the transverse  colon and ascending colon. The polyps were 3 to 6                            mm in size. These polyps were removed with a cold                            snare. Resection and retrieval were complete.                           The exam was otherwise without abnormality on                            direct and retroflexion views. Complications:            No immediate complications. Estimated Blood Loss:     Estimated blood loss was minimal. Impression:               - Diverticulosis in the left colon and in the right                            colon.                           - Four 3 to 6 mm polyps in the transverse colon and                            in the ascending colon, removed with a cold snare.                            Resected and retrieved.                           - The examination was otherwise normal on direct                            and retroflexion views.                            No source of occult blood loss. Suspected false                            positive test with normal hemglobin. Recommendation:           - Patient has a contact number available for                            emergencies. The signs and symptoms of potential                            delayed complications were discussed with the                            patient. Return to normal activities tomorrow.  Written discharge instructions were provided to the                            patient.                           - Resume previous diet.                           - Resume Plavix (clopidogrel) at prior dose                            tomorrow.                           - Await pathology results.                           - Repeat colonoscopy is recommended for                            surveillance. The colonoscopy date will be                            determined after pathology results from today's                            exam become available for review.                           - Return to my office after studies are complete. Montoya Watkin L. Loletha Carrow, MD 09/14/2017 3:29:18 PM This report has been signed electronically.

## 2017-09-14 NOTE — Patient Instructions (Signed)
Impression/Recommendations:  Polyp handout given to patient. Diverticulosis handout given to patient.  Resume previous diet. Continue present medications. Resume Plavix (clopidogrel) at prior dose tomorrow.  Repeat colonoscopy recommended for surveillance.  Date to be determined after pathology results reviewed.  Return to GI office after studies are complete.  YOU HAD AN ENDOSCOPIC PROCEDURE TODAY AT [facility]:   Refer to the procedure report that was given to you for any specific questions about what was found during the examination.  If the procedure report does not answer your questions, please call your gastroenterologist to clarify.  If you requested that your care partner not be given the details of your procedure findings, then the procedure report has been included in a sealed envelope for you to review at your convenience later.  YOU SHOULD EXPECT: Some feelings of bloating in the abdomen. Passage of more gas than usual.  Walking can help get rid of the air that was put into your GI tract during the procedure and reduce the bloating. If you had a lower endoscopy (such as a colonoscopy or flexible sigmoidoscopy) you may notice spotting of blood in your stool or on the toilet paper. If you underwent a bowel prep for your procedure, you may not have a normal bowel movement for a few days.  Please Note:  You might notice some irritation and congestion in your nose or some drainage.  This is from the oxygen used during your procedure.  There is no need for concern and it should clear up in a day or so.  SYMPTOMS TO REPORT IMMEDIATELY:   Following lower endoscopy (colonoscopy or flexible sigmoidoscopy):  Excessive amounts of blood in the stool  Significant tenderness or worsening of abdominal pains  Swelling of the abdomen that is new, acute  Fever of 100F or higher   Following upper endoscopy (EGD)  Vomiting of blood or coffee ground material  New chest pain or  pain under the shoulder blades  Painful or persistently difficult swallowing  New shortness of breath  Fever of 100F or higher  Black, tarry-looking stools  For urgent or emergent issues, a gastroenterologist can be reached at any hour by calling [phone].   DIET:  We do recommend a small meal at first, but then you may proceed to your regular diet.  Drink plenty of fluids but you should avoid alcoholic beverages for 24 hours.  ACTIVITY:  You should plan to take it easy for the rest of today and you should NOT DRIVE or use heavy machinery until tomorrow (because of the sedation medicines used during the test).    FOLLOW UP: Our staff will call the number listed on your records the next business day following your procedure to check on you and address any questions or concerns that you may have regarding the information given to you following your procedure. If we do not reach you, we will leave a message.  However, if you are feeling well and you are not experiencing any problems, there is no need to return our call.  We will assume that you have returned to your regular daily activities without incident.  If any biopsies were taken you will be contacted by phone or by letter within the next 1-3 weeks.  Please call us at [phone] if you have not heard about the biopsies in 3 weeks.    SIGNATURES/CONFIDENTIALITY: You and/or your care partner have signed paperwork which will be entered into your electronic medical record.  These signatures attest to the fact that that the information above on your After Visit Summary has been reviewed and is understood.  Full responsibility of the confidentiality of this discharge information lies with you and/or your care-partner.

## 2017-09-15 ENCOUNTER — Telehealth: Payer: Self-pay | Admitting: *Deleted

## 2017-09-15 NOTE — Telephone Encounter (Signed)
  Follow up Call-  Call back number 09/14/2017  Post procedure Call Back phone  # [phone]  Permission to leave phone message Yes  Some recent data might be hidden     Patient questions:  Do you have a fever, pain , or abdominal swelling? No. Pain Score  0 *  Have you tolerated food without any problems? Yes.    Have you been able to return to your normal activities? Yes.    Do you have any questions about your discharge instructions: Diet   No. Medications  No. Follow up visit  No.  Do you have questions or concerns about your Care? No.  Actions: * If pain score is 4 or above: No action needed, pain <4.  Spoke with pts wife.  She states he is doing fine and back to normal.

## 2017-09-22 ENCOUNTER — Encounter: Payer: Self-pay | Admitting: Gastroenterology

## 2017-10-11 DIAGNOSIS — E1169 Type 2 diabetes mellitus with other specified complication: Secondary | ICD-10-CM | POA: Diagnosis not present

## 2017-10-11 DIAGNOSIS — K219 Gastro-esophageal reflux disease without esophagitis: Secondary | ICD-10-CM | POA: Diagnosis not present

## 2017-10-11 DIAGNOSIS — Z23 Encounter for immunization: Secondary | ICD-10-CM | POA: Diagnosis not present

## 2017-10-11 DIAGNOSIS — I2510 Atherosclerotic heart disease of native coronary artery without angina pectoris: Secondary | ICD-10-CM | POA: Diagnosis not present

## 2017-10-11 DIAGNOSIS — Z6828 Body mass index (BMI) 28.0-28.9, adult: Secondary | ICD-10-CM | POA: Diagnosis not present

## 2017-10-11 DIAGNOSIS — I10 Essential (primary) hypertension: Secondary | ICD-10-CM | POA: Diagnosis not present

## 2017-10-12 ENCOUNTER — Ambulatory Visit: Payer: PPO | Admitting: Gastroenterology

## 2017-10-12 ENCOUNTER — Encounter: Payer: Self-pay | Admitting: Gastroenterology

## 2017-10-12 VITALS — BP 150/70 | HR 60 | Ht 67.5 in | Wt 191.4 lb

## 2017-10-12 DIAGNOSIS — R1013 Epigastric pain: Secondary | ICD-10-CM

## 2017-10-12 DIAGNOSIS — D508 Other iron deficiency anemias: Secondary | ICD-10-CM | POA: Diagnosis not present

## 2017-10-12 DIAGNOSIS — K5900 Constipation, unspecified: Secondary | ICD-10-CM

## 2017-10-12 NOTE — Patient Instructions (Addendum)
If you are age 71 or older, your body mass index should be between 23-30. Your Body mass index is 29.53 kg/m. If this is out of the aforementioned range listed, please consider follow up with your Primary Care Provider.  If you are age 40 or younger, your body mass index should be between 19-25. Your Body mass index is 29.53 kg/m. If this is out of the aformentioned range listed, please consider follow up with your Primary Care Provider.    Decrease Nexium to once daily, and keep zantac at twice a day.  If you feel about the same after a week, stop the AM dose of Nexium.  Call me in 3-4 weeks with a symptom update. [phone]   It was a pleasure to see you today!  Dr. Loletha Carrow

## 2017-10-12 NOTE — Progress Notes (Signed)
[facility] Progress Note  Chief Complaint: Upper abdominal pain and constipation  Subjective  History:  I saw Hunter Mcbride along with his wife today. He saw [place] in June for iron deficiency and reported heme positive stool with normal hemoglobin, also upper abdominal pain and chronic constipation.  Upper endoscopy was normal, biopsies negative for H. pylori.  Colonoscopy was a good preparation, 3 tubular adenomas without dysplasia were found, exam otherwise normal.  He is feeling much better, with some nonspecific dyspepsia with bloating and burning that has been going on for decades.  His constipation is improved on MiraLAX, 1 capful once to twice a day as needed.  He has come to realize that he does not need to have a bowel movement every day, as he had previously been somewhat obsessive about this.  When he feels relaxed, as when he is in the woods hunting, he has daily bowel movements without trouble. His appetite is been good and his weight stable. ROS: Cardiovascular:  no exertional chest pain Respiratory: no dyspnea  The patient's Past Medical, Family and Social History were reviewed and are on file in the EMR.  Objective:  Med list reviewed  Current Outpatient Medications:  .  acetaminophen (TYLENOL) 500 MG tablet, Take 1,000 mg by mouth every 8 (eight) hours as needed (pain). , Disp: , Rfl:  .  ALPRAZolam (XANAX) 0.5 MG tablet, Take 0.5 tablets (0.25 mg total) by mouth daily as needed for anxiety., Disp: , Rfl: 0 .  amLODipine (NORVASC) 5 MG tablet, Take 5 mg by mouth 2 (two) times daily. , Disp: , Rfl:  .  aspirin 81 MG tablet, Take 81 mg by mouth at bedtime. , Disp: , Rfl:  .  atenolol (TENORMIN) 50 MG tablet, Take 1 tablet (50 mg total) by mouth at bedtime., Disp: 90 tablet, Rfl: 3 .  atorvastatin (LIPITOR) 80 MG tablet, Take 80 mg by mouth daily at 6 PM., Disp: , Rfl:  .  canagliflozin (INVOKANA) 300 MG TABS tablet, Take 300 mg by mouth daily before breakfast., Disp:  , Rfl:  .  Cholecalciferol (VITAMIN D PO), Take 1 capsule by mouth daily., Disp: , Rfl:  .  clopidogrel (PLAVIX) 75 MG tablet, Take 1 tablet (75 mg total) by mouth daily with breakfast., Disp: 90 tablet, Rfl: 0 .  docusate sodium (COLACE) 100 MG capsule, Take 100 mg by mouth daily as needed for mild constipation., Disp: , Rfl:  .  esomeprazole (NEXIUM) 40 MG capsule, Take 1 capsule (40 mg total) by mouth 2 (two) times daily before a meal., Disp: 60 capsule, Rfl: 2 .  gabapentin (NEURONTIN) 300 MG capsule, Take 300 mg by mouth at bedtime. , Disp: , Rfl:  .  hydrochlorothiazide (HYDRODIURIL) 25 MG tablet, Take 25 mg by mouth daily., Disp: , Rfl:  .  Insulin Degludec (TRESIBA [place]), Inject 18 Units into the skin every morning. , Disp: , Rfl:  .  irbesartan (AVAPRO) 150 MG tablet, Take 1 tablet (150 mg total) daily by mouth. (Patient taking differently: Take 300 mg by mouth daily. ), Disp: 30 tablet, Rfl: 11 .  metFORMIN (GLUCOPHAGE-XR) 500 MG 24 hr tablet, Take 500 mg by mouth 2 (two) times daily., Disp: , Rfl:  .  Multiple Vitamin (MULTIVITAMIN) tablet, Take 1 tablet by mouth daily., Disp: , Rfl:  .  nitroGLYCERIN (NITROSTAT) 0.4 MG SL tablet, Place 1 tablet (0.4 mg total) under the tongue every 5 (five) minutes as needed for chest pain., Disp:  25 tablet, Rfl: 3 .  polyethylene glycol (MIRALAX / GLYCOLAX) packet, Take 17 g by mouth daily as needed for mild constipation., Disp: , Rfl:  .  ranitidine (ZANTAC) 150 MG tablet, Take 1 tablet (150 mg total) by mouth 2 (two) times daily., Disp: 60 tablet, Rfl: 2 .  traMADol (ULTRAM) 50 MG tablet, Take 1 tablet (50 mg total) by mouth every 6 (six) hours as needed for moderate pain., Disp: 30 tablet, Rfl: 0  Current Facility-Administered Medications:  .  0.9 %  sodium chloride infusion, 500 mL, Intravenous, Once, Danis, Estill Cotta III, MD   Vital signs in last 24 hrs: Vitals:   10/12/17 0927  BP: (!) 150/70  Pulse: 60   physical Exam  He is  well-appearing  HEENT: sclera anicteric, oral mucosa moist without lesions  Neck: supple, no thyromegaly, JVD or lymphadenopathy  Cardiac: RRR without murmurs, S1S2 heard, no peripheral edema  Pulm: clear to auscultation bilaterally, normal RR and effort noted  Abdomen: soft, no tenderness, with active bowel sounds. No guarding or palpable hepatosplenomegaly.  Skin; warm and dry, no jaundice or rash   @ASSESSMENTPLANBEGIN @ Assessment: Encounter Diagnoses  Name Primary?  . Abdominal pain, epigastric Yes  . Other iron deficiency anemia   . Constipation, unspecified constipation type    Nonulcer dyspepsia it has behaved variably over years, lately stable. No cause for chronic blood loss regarding iron deficiency with normal hemoglobin. Chronic constipation, appears functional/slow transit.  He is doing well these days on lacks 1-2 times daily.  He and his wife were reassured, he will see me as needed.  Total time 15 minutes, over half spent face-to-face with patient in counseling and coordination of care.   Nelida Meuse III

## 2017-11-11 ENCOUNTER — Other Ambulatory Visit: Payer: Self-pay | Admitting: Nurse Practitioner

## 2017-11-13 ENCOUNTER — Telehealth: Payer: Self-pay | Admitting: *Deleted

## 2017-11-13 NOTE — Telephone Encounter (Signed)
S/w pt's wife per DPR to refill medication.  Confirmed norvasc one tablet by mouth (5mg ) bid. Went to fill medication someone from office already did.

## 2017-12-25 ENCOUNTER — Ambulatory Visit (INDEPENDENT_AMBULATORY_CARE_PROVIDER_SITE_OTHER): Payer: PPO | Admitting: Nurse Practitioner

## 2017-12-25 ENCOUNTER — Telehealth: Payer: Self-pay | Admitting: Nurse Practitioner

## 2017-12-25 ENCOUNTER — Encounter (HOSPITAL_COMMUNITY)
Admission: EM | Disposition: A | Discharge status: Home / Self Care | Payer: Self-pay | Source: Home / Self Care | Attending: Emergency Medicine

## 2017-12-25 ENCOUNTER — Emergency Department (HOSPITAL_COMMUNITY): Payer: PPO

## 2017-12-25 ENCOUNTER — Encounter: Payer: Self-pay | Admitting: Nurse Practitioner

## 2017-12-25 ENCOUNTER — Encounter (HOSPITAL_COMMUNITY): Payer: Self-pay | Admitting: Emergency Medicine

## 2017-12-25 ENCOUNTER — Ambulatory Visit (HOSPITAL_COMMUNITY): Admit: 2017-12-25 | Payer: PPO | Admitting: Internal Medicine

## 2017-12-25 ENCOUNTER — Other Ambulatory Visit: Payer: Self-pay

## 2017-12-25 ENCOUNTER — Observation Stay (HOSPITAL_COMMUNITY)
Admission: EM | Admit: 2017-12-25 | Discharge: 2017-12-26 | Disposition: A | Discharge status: Home / Self Care | Payer: PPO | Attending: Cardiovascular Disease | Admitting: Cardiovascular Disease

## 2017-12-25 VITALS — BP 132/80 | HR 57 | Ht 69.0 in | Wt 193.4 lb

## 2017-12-25 DIAGNOSIS — I25110 Atherosclerotic heart disease of native coronary artery with unstable angina pectoris: Secondary | ICD-10-CM | POA: Diagnosis not present

## 2017-12-25 DIAGNOSIS — M1990 Unspecified osteoarthritis, unspecified site: Secondary | ICD-10-CM | POA: Insufficient documentation

## 2017-12-25 DIAGNOSIS — Z7984 Long term (current) use of oral hypoglycemic drugs: Secondary | ICD-10-CM | POA: Insufficient documentation

## 2017-12-25 DIAGNOSIS — I259 Chronic ischemic heart disease, unspecified: Secondary | ICD-10-CM

## 2017-12-25 DIAGNOSIS — I252 Old myocardial infarction: Secondary | ICD-10-CM | POA: Diagnosis not present

## 2017-12-25 DIAGNOSIS — I2584 Coronary atherosclerosis due to calcified coronary lesion: Secondary | ICD-10-CM | POA: Insufficient documentation

## 2017-12-25 DIAGNOSIS — G4489 Other headache syndrome: Secondary | ICD-10-CM | POA: Diagnosis not present

## 2017-12-25 DIAGNOSIS — Z951 Presence of aortocoronary bypass graft: Secondary | ICD-10-CM | POA: Insufficient documentation

## 2017-12-25 DIAGNOSIS — Z885 Allergy status to narcotic agent status: Secondary | ICD-10-CM | POA: Diagnosis not present

## 2017-12-25 DIAGNOSIS — G4733 Obstructive sleep apnea (adult) (pediatric): Secondary | ICD-10-CM | POA: Insufficient documentation

## 2017-12-25 DIAGNOSIS — Z96611 Presence of right artificial shoulder joint: Secondary | ICD-10-CM | POA: Diagnosis not present

## 2017-12-25 DIAGNOSIS — R51 Headache: Secondary | ICD-10-CM | POA: Diagnosis not present

## 2017-12-25 DIAGNOSIS — Z7982 Long term (current) use of aspirin: Secondary | ICD-10-CM | POA: Insufficient documentation

## 2017-12-25 DIAGNOSIS — M109 Gout, unspecified: Secondary | ICD-10-CM | POA: Diagnosis not present

## 2017-12-25 DIAGNOSIS — I249 Acute ischemic heart disease, unspecified: Secondary | ICD-10-CM

## 2017-12-25 DIAGNOSIS — I2510 Atherosclerotic heart disease of native coronary artery without angina pectoris: Secondary | ICD-10-CM | POA: Diagnosis present

## 2017-12-25 DIAGNOSIS — R0789 Other chest pain: Secondary | ICD-10-CM | POA: Diagnosis not present

## 2017-12-25 DIAGNOSIS — Z955 Presence of coronary angioplasty implant and graft: Secondary | ICD-10-CM | POA: Insufficient documentation

## 2017-12-25 DIAGNOSIS — Z833 Family history of diabetes mellitus: Secondary | ICD-10-CM | POA: Insufficient documentation

## 2017-12-25 DIAGNOSIS — Z8249 Family history of ischemic heart disease and other diseases of the circulatory system: Secondary | ICD-10-CM | POA: Diagnosis not present

## 2017-12-25 DIAGNOSIS — J45909 Unspecified asthma, uncomplicated: Secondary | ICD-10-CM | POA: Diagnosis not present

## 2017-12-25 DIAGNOSIS — E119 Type 2 diabetes mellitus without complications: Secondary | ICD-10-CM | POA: Diagnosis not present

## 2017-12-25 DIAGNOSIS — Z9889 Other specified postprocedural states: Secondary | ICD-10-CM | POA: Insufficient documentation

## 2017-12-25 DIAGNOSIS — Z7902 Long term (current) use of antithrombotics/antiplatelets: Secondary | ICD-10-CM | POA: Insufficient documentation

## 2017-12-25 DIAGNOSIS — I10 Essential (primary) hypertension: Secondary | ICD-10-CM | POA: Diagnosis not present

## 2017-12-25 DIAGNOSIS — Z79899 Other long term (current) drug therapy: Secondary | ICD-10-CM | POA: Diagnosis not present

## 2017-12-25 DIAGNOSIS — I208 Other forms of angina pectoris: Secondary | ICD-10-CM

## 2017-12-25 DIAGNOSIS — R634 Abnormal weight loss: Secondary | ICD-10-CM | POA: Insufficient documentation

## 2017-12-25 DIAGNOSIS — K219 Gastro-esophageal reflux disease without esophagitis: Secondary | ICD-10-CM | POA: Diagnosis not present

## 2017-12-25 DIAGNOSIS — E785 Hyperlipidemia, unspecified: Secondary | ICD-10-CM | POA: Diagnosis not present

## 2017-12-25 DIAGNOSIS — Z96641 Presence of right artificial hip joint: Secondary | ICD-10-CM | POA: Diagnosis not present

## 2017-12-25 DIAGNOSIS — R079 Chest pain, unspecified: Secondary | ICD-10-CM | POA: Diagnosis not present

## 2017-12-25 HISTORY — PX: LEFT HEART CATH AND CORS/GRAFTS ANGIOGRAPHY: CATH118250

## 2017-12-25 LAB — CBC WITH DIFFERENTIAL/PLATELET
ABS IMMATURE GRANULOCYTES: 0.01 10*3/uL (ref 0.00–0.07)
Abs Immature Granulocytes: 0.01 10*3/uL (ref 0.00–0.07)
Basophils Absolute: 0 10*3/uL (ref 0.0–0.1)
Basophils Absolute: 0 10*3/uL (ref 0.0–0.1)
Basophils Relative: 0 %
Basophils Relative: 1 %
EOS PCT: 1 %
Eosinophils Absolute: 0 10*3/uL (ref 0.0–0.5)
Eosinophils Absolute: 0 10*3/uL (ref 0.0–0.5)
Eosinophils Relative: 1 %
HCT: 39.4 % (ref 39.0–52.0)
HEMATOCRIT: 41.4 % (ref 39.0–52.0)
HEMOGLOBIN: 13.3 g/dL (ref 13.0–17.0)
Hemoglobin: 13 g/dL (ref 13.0–17.0)
Immature Granulocytes: 0 %
Immature Granulocytes: 0 %
LYMPHS ABS: 1.6 10*3/uL (ref 0.7–4.0)
LYMPHS PCT: 34 %
Lymphocytes Relative: 39 %
Lymphs Abs: 1.6 10*3/uL (ref 0.7–4.0)
MCH: 28 pg (ref 26.0–34.0)
MCH: 28.1 pg (ref 26.0–34.0)
MCHC: 32.1 g/dL (ref 30.0–36.0)
MCHC: 33 g/dL (ref 30.0–36.0)
MCV: 87.2 fL (ref 80.0–100.0)
MCV: 85.1 fL (ref 80.0–100.0)
MONO ABS: 0.4 10*3/uL (ref 0.1–1.0)
MONOS PCT: 8 %
Monocytes Absolute: 0.3 10*3/uL (ref 0.1–1.0)
Monocytes Relative: 7 %
NEUTROS ABS: 2.7 10*3/uL (ref 1.7–7.7)
Neutro Abs: 2.1 10*3/uL (ref 1.7–7.7)
Neutrophils Relative %: 57 %
Neutrophils Relative %: 52 %
Platelets: 145 10*3/uL — ABNORMAL LOW (ref 150–400)
Platelets: 128 10*3/uL — ABNORMAL LOW (ref 150–400)
RBC: 4.75 MIL/uL (ref 4.22–5.81)
RBC: 4.63 MIL/uL (ref 4.22–5.81)
RDW: 13 % (ref 11.5–15.5)
RDW: 13 % (ref 11.5–15.5)
WBC: 4.7 10*3/uL (ref 4.0–10.5)
WBC: 4.1 10*3/uL (ref 4.0–10.5)
nRBC: 0 % (ref 0.0–0.2)
nRBC: 0 % (ref 0.0–0.2)

## 2017-12-25 LAB — COMPREHENSIVE METABOLIC PANEL
ALBUMIN: 3.9 g/dL (ref 3.5–5.0)
ALT: 42 U/L (ref 0–44)
ALT: 38 U/L (ref 0–44)
ANION GAP: 8 (ref 5–15)
AST: 34 U/L (ref 15–41)
AST: 29 U/L (ref 15–41)
Albumin: 3.7 g/dL (ref 3.5–5.0)
Alkaline Phosphatase: 71 U/L (ref 38–126)
Alkaline Phosphatase: 69 U/L (ref 38–126)
Anion gap: 5 (ref 5–15)
BUN: 25 mg/dL — AB (ref 8–23)
BUN: 23 mg/dL (ref 8–23)
CO2: 26 mmol/L (ref 22–32)
CO2: 29 mmol/L (ref 22–32)
Calcium: 9.2 mg/dL (ref 8.9–10.3)
Calcium: 9.1 mg/dL (ref 8.9–10.3)
Chloride: 103 mmol/L (ref 98–111)
Chloride: 104 mmol/L (ref 98–111)
Creatinine, Ser: 1.04 mg/dL (ref 0.61–1.24)
Creatinine, Ser: 0.98 mg/dL (ref 0.61–1.24)
GFR calc Af Amer: >60 mL/min (ref >60)
GFR calc Af Amer: >60 mL/min (ref >60)
GFR calc non Af Amer: >60 mL/min (ref >60)
GFR calc non Af Amer: >60 mL/min (ref >60)
GLUCOSE: 197 mg/dL — AB (ref 70–99)
Glucose, Bld: 96 mg/dL (ref 70–99)
Potassium: 3.6 mmol/L (ref 3.5–5.1)
Potassium: 3.3 mmol/L — ABNORMAL LOW (ref 3.5–5.1)
SODIUM: 137 mmol/L (ref 135–145)
Sodium: 138 mmol/L (ref 135–145)
Total Bilirubin: 0.7 mg/dL (ref 0.3–1.2)
Total Bilirubin: 1 mg/dL (ref 0.3–1.2)
Total Protein: 6.4 g/dL — ABNORMAL LOW (ref 6.5–8.1)
Total Protein: 6.2 g/dL — ABNORMAL LOW (ref 6.5–8.1)

## 2017-12-25 LAB — APTT
aPTT: 33 seconds (ref 24–36)
aPTT: >200 seconds (ref 24–36)

## 2017-12-25 LAB — MAGNESIUM: Magnesium: 2.2 mg/dL (ref 1.7–2.4)

## 2017-12-25 LAB — LIPID PANEL
Cholesterol: 90 mg/dL (ref 0–200)
HDL: 36 mg/dL — ABNORMAL LOW (ref >40)
LDL Cholesterol: 36 mg/dL (ref 0–99)
TRIGLYCERIDES: 89 mg/dL (ref <150)
Total CHOL/HDL Ratio: 2.5 RATIO
VLDL: 18 mg/dL (ref 0–40)

## 2017-12-25 LAB — HEMOGLOBIN A1C
Hgb A1c MFr Bld: 7.5 % — ABNORMAL HIGH (ref 4.8–5.6)
Mean Plasma Glucose: 168.55 mg/dL

## 2017-12-25 LAB — PROTIME-INR
INR: 1.06
INR: 1.23
PROTHROMBIN TIME: 13.7 s (ref 11.4–15.2)
Prothrombin Time: 15.4 seconds — ABNORMAL HIGH (ref 11.4–15.2)

## 2017-12-25 LAB — GLUCOSE, CAPILLARY
Glucose-Capillary: 68 mg/dL — ABNORMAL LOW (ref 70–99)
Glucose-Capillary: 100 mg/dL — ABNORMAL HIGH (ref 70–99)
Glucose-Capillary: 108 mg/dL — ABNORMAL HIGH (ref 70–99)

## 2017-12-25 LAB — TROPONIN I: Troponin I: <0.03 ng/mL (ref <0.03)

## 2017-12-25 LAB — BRAIN NATRIURETIC PEPTIDE: B Natriuretic Peptide: 32.2 pg/mL (ref 0.0–100.0)

## 2017-12-25 SURGERY — LEFT HEART CATH AND CORS/GRAFTS ANGIOGRAPHY
Anesthesia: LOCAL

## 2017-12-25 MED ORDER — VERAPAMIL HCL 2.5 MG/ML IV SOLN
INTRAVENOUS | Status: DC | PRN
  Administered 2017-12-25: 10 mL via INTRA_ARTERIAL

## 2017-12-25 MED ORDER — NITROGLYCERIN 0.4 MG SL SUBL: 0.4000 mg | SUBLINGUAL_TABLET | SUBLINGUAL | Status: DC | PRN

## 2017-12-25 MED ORDER — IOHEXOL 350 MG/ML SOLN
INTRAVENOUS | Status: DC | PRN
  Administered 2017-12-25: 100 mL via INTRA_ARTERIAL

## 2017-12-25 MED ORDER — HYDROCHLOROTHIAZIDE 25 MG PO TABS: 25.0000 mg | ORAL_TABLET | Freq: Every day | ORAL | Status: DC

## 2017-12-25 MED ORDER — INSULIN ASPART 100 UNIT/ML ~~LOC~~ SOLN: 0.0000 [IU] | Freq: Three times a day (TID) | SUBCUTANEOUS | Status: DC

## 2017-12-25 MED ORDER — MIDAZOLAM HCL 2 MG/2ML IJ SOLN
INTRAMUSCULAR | Status: AC
  Filled 2017-12-25: qty 2

## 2017-12-25 MED ORDER — LIDOCAINE HCL (PF) 1 % IJ SOLN
INTRAMUSCULAR | Status: AC
  Filled 2017-12-25: qty 30

## 2017-12-25 MED ORDER — ATENOLOL 25 MG PO TABS: 50.0000 mg | ORAL_TABLET | Freq: Every day | ORAL | Status: DC

## 2017-12-25 MED ORDER — ACETAMINOPHEN 500 MG PO TABS
1000.0000 mg | ORAL_TABLET | Freq: Three times a day (TID) | ORAL | Status: DC | PRN
  Administered 2017-12-25: 1000 mg via ORAL
  Filled 2017-12-25: qty 2

## 2017-12-25 MED ORDER — SODIUM CHLORIDE 0.9 % IV SOLN: INTRAVENOUS | Status: DC

## 2017-12-25 MED ORDER — ATORVASTATIN CALCIUM 80 MG PO TABS: 80.0000 mg | ORAL_TABLET | Freq: Every day | ORAL | Status: DC

## 2017-12-25 MED ORDER — MIDAZOLAM HCL 2 MG/2ML IJ SOLN
INTRAMUSCULAR | Status: DC | PRN
  Administered 2017-12-25: 2 mg via INTRAVENOUS
  Administered 2017-12-25: 1 mg via INTRAVENOUS

## 2017-12-25 MED ORDER — HEPARIN SODIUM (PORCINE) 1000 UNIT/ML IJ SOLN
INTRAMUSCULAR | Status: DC | PRN
  Administered 2017-12-25: 5000 [IU] via INTRAVENOUS

## 2017-12-25 MED ORDER — FENTANYL CITRATE (PF) 100 MCG/2ML IJ SOLN
INTRAMUSCULAR | Status: DC | PRN
  Administered 2017-12-25 (×2): 25 ug via INTRAVENOUS

## 2017-12-25 MED ORDER — ONDANSETRON HCL 4 MG/2ML IJ SOLN: 4.0000 mg | Freq: Four times a day (QID) | INTRAMUSCULAR | Status: DC | PRN

## 2017-12-25 MED ORDER — ASPIRIN 81 MG PO CHEW: 324.0000 mg | CHEWABLE_TABLET | Freq: Once | ORAL | Status: DC

## 2017-12-25 MED ORDER — CLOPIDOGREL BISULFATE 75 MG PO TABS: 75.0000 mg | ORAL_TABLET | Freq: Every day | ORAL | Status: DC

## 2017-12-25 MED ORDER — SODIUM CHLORIDE 0.9% FLUSH: 3.0000 mL | INTRAVENOUS | Status: DC | PRN

## 2017-12-25 MED ORDER — SODIUM CHLORIDE 0.9 % WEIGHT BASED INFUSION
1.0000 mL/kg/h | INTRAVENOUS | Status: DC
  Administered 2017-12-25: 1 mL/kg/h via INTRAVENOUS

## 2017-12-25 MED ORDER — ALPRAZOLAM 0.25 MG PO TABS: 0.2500 mg | ORAL_TABLET | Freq: Every day | ORAL | Status: DC | PRN

## 2017-12-25 MED ORDER — SODIUM CHLORIDE 0.9% FLUSH: 3.0000 mL | Freq: Two times a day (BID) | INTRAVENOUS | Status: DC

## 2017-12-25 MED ORDER — GABAPENTIN 300 MG PO CAPS
300.0000 mg | ORAL_CAPSULE | Freq: Every day | ORAL | Status: DC
  Administered 2017-12-25: 300 mg via ORAL
  Filled 2017-12-25: qty 1

## 2017-12-25 MED ORDER — CANAGLIFLOZIN 300 MG PO TABS
300.0000 mg | ORAL_TABLET | Freq: Every day | ORAL | Status: DC
  Filled 2017-12-25: qty 1

## 2017-12-25 MED ORDER — FAMOTIDINE 20 MG PO TABS: 20.0000 mg | ORAL_TABLET | Freq: Every day | ORAL | Status: DC

## 2017-12-25 MED ORDER — IRBESARTAN 150 MG PO TABS: 150.0000 mg | ORAL_TABLET | Freq: Every day | ORAL | Status: DC

## 2017-12-25 MED ORDER — ASPIRIN 81 MG PO CHEW: 81.0000 mg | CHEWABLE_TABLET | ORAL | Status: DC

## 2017-12-25 MED ORDER — FENTANYL CITRATE (PF) 100 MCG/2ML IJ SOLN
INTRAMUSCULAR | Status: AC
  Filled 2017-12-25: qty 2

## 2017-12-25 MED ORDER — DOCUSATE SODIUM 100 MG PO CAPS: 100.0000 mg | ORAL_CAPSULE | Freq: Every day | ORAL | Status: DC | PRN

## 2017-12-25 MED ORDER — CLOPIDOGREL BISULFATE 75 MG PO TABS: 75.0000 mg | ORAL_TABLET | ORAL | Status: DC

## 2017-12-25 MED ORDER — ASPIRIN EC 81 MG PO TBEC: 81.0000 mg | DELAYED_RELEASE_TABLET | Freq: Every day | ORAL | Status: DC

## 2017-12-25 MED ORDER — DIAZEPAM 5 MG PO TABS: 10.0000 mg | ORAL_TABLET | ORAL | Status: DC

## 2017-12-25 MED ORDER — HEPARIN (PORCINE) IN NACL 1000-0.9 UT/500ML-% IV SOLN
INTRAVENOUS | Status: DC | PRN
  Administered 2017-12-25 (×2): 500 mL

## 2017-12-25 MED ORDER — VERAPAMIL HCL 2.5 MG/ML IV SOLN
INTRAVENOUS | Status: AC
  Filled 2017-12-25: qty 2

## 2017-12-25 MED ORDER — AMLODIPINE BESYLATE 5 MG PO TABS: 5.0000 mg | ORAL_TABLET | Freq: Every day | ORAL | Status: DC

## 2017-12-25 MED ORDER — TRAMADOL HCL 50 MG PO TABS: 50.0000 mg | ORAL_TABLET | Freq: Four times a day (QID) | ORAL | Status: DC | PRN

## 2017-12-25 MED ORDER — ADULT MULTIVITAMIN W/MINERALS CH: 1.0000 | ORAL_TABLET | Freq: Every day | ORAL | Status: DC

## 2017-12-25 MED ORDER — ONE-DAILY MULTI VITAMINS PO TABS: 1.0000 | ORAL_TABLET | Freq: Every day | ORAL | Status: DC

## 2017-12-25 MED ORDER — PANTOPRAZOLE SODIUM 40 MG PO TBEC: 40.0000 mg | DELAYED_RELEASE_TABLET | Freq: Every day | ORAL | Status: DC

## 2017-12-25 MED ORDER — AMLODIPINE BESYLATE 5 MG PO TABS
5.0000 mg | ORAL_TABLET | Freq: Two times a day (BID) | ORAL | Status: DC
  Administered 2017-12-25: 5 mg via ORAL
  Filled 2017-12-25: qty 1

## 2017-12-25 MED ORDER — ACETAMINOPHEN 325 MG PO TABS: 650.0000 mg | ORAL_TABLET | ORAL | Status: DC | PRN

## 2017-12-25 MED ORDER — NITROGLYCERIN IN D5W 200-5 MCG/ML-% IV SOLN: 0.0000 ug/min | INTRAVENOUS | Status: DC

## 2017-12-25 MED ORDER — SODIUM CHLORIDE 0.9 % IV SOLN: 250.0000 mL | INTRAVENOUS | Status: DC | PRN

## 2017-12-25 MED ORDER — PANTOPRAZOLE SODIUM 40 MG PO TBEC: 40.0000 mg | DELAYED_RELEASE_TABLET | Freq: Two times a day (BID) | ORAL | 1 refills | Status: DC

## 2017-12-25 MED ORDER — ATORVASTATIN CALCIUM 80 MG PO TABS
80.0000 mg | ORAL_TABLET | Freq: Every day | ORAL | Status: DC
  Administered 2017-12-25: 80 mg via ORAL
  Filled 2017-12-25: qty 1

## 2017-12-25 MED ORDER — GABAPENTIN 600 MG PO TABS: 300.0000 mg | ORAL_TABLET | Freq: Every day | ORAL | Status: DC

## 2017-12-25 MED ORDER — LIDOCAINE HCL (PF) 1 % IJ SOLN
INTRAMUSCULAR | Status: DC | PRN
  Administered 2017-12-25: 2 mL

## 2017-12-25 MED ORDER — HEPARIN SODIUM (PORCINE) 5000 UNIT/ML IJ SOLN
60.0000 [IU]/kg | Freq: Once | INTRAMUSCULAR | Status: AC
  Administered 2017-12-25: 5250 [IU] via INTRAVENOUS
  Filled 2017-12-25: qty 2

## 2017-12-25 MED ORDER — SODIUM CHLORIDE 0.9 % IV SOLN
INTRAVENOUS | Status: DC
  Administered 2017-12-25: 12:00:00 via INTRAVENOUS

## 2017-12-25 MED ORDER — POLYETHYLENE GLYCOL 3350 17 G PO PACK: 17.0000 g | PACK | Freq: Every day | ORAL | Status: DC | PRN

## 2017-12-25 MED ORDER — HEPARIN (PORCINE) IN NACL 1000-0.9 UT/500ML-% IV SOLN
INTRAVENOUS | Status: AC
  Filled 2017-12-25: qty 1000

## 2017-12-25 SURGICAL SUPPLY — 13 items
CATH EXPO 5F MPA-1 (CATHETERS) ×1 IMPLANT
CATH INFINITI 5 FR AR1 MOD (CATHETERS) ×1 IMPLANT
CATH INFINITI 5 FR RCB (CATHETERS) ×1 IMPLANT
CATH INFINITI 5FR AL1 (CATHETERS) ×1 IMPLANT
CATH INFINITI 5FR MULTPACK ANG (CATHETERS) ×1 IMPLANT
DEVICE RAD COMP TR BAND LRG (VASCULAR PRODUCTS) ×1 IMPLANT
GLIDESHEATH SLEND SS 6F .021 (SHEATH) ×1 IMPLANT
GUIDEWIRE INQWIRE 1.5J.035X260 (WIRE) IMPLANT
INQWIRE 1.5J .035X260CM (WIRE) ×2
KIT HEART LEFT (KITS) ×2 IMPLANT
PACK CARDIAC CATHETERIZATION (CUSTOM PROCEDURE TRAY) ×2 IMPLANT
TRANSDUCER W/STOPCOCK (MISCELLANEOUS) ×2 IMPLANT
TUBING CIL FLEX 10 FLL-RA (TUBING) ×2 IMPLANT

## 2017-12-25 NOTE — ED Provider Notes (Signed)
[name] DEPART[facility] Provider Note   CSN: [id number] Arrival date & time: 12/25/17  1146     History   Chief Complaint Chief Complaint  Patient presents with  . Chest Pain    HPI Hunter Mcbride. is a 72 y.o. male.  Patient w hx cad, cabg, c/o mid back and mid chest pain onset this AM. Notes w hx prior cardiac chest pain, was the same pain, mainly in back and around to chest. Currently pain also radiates to left arm. Pain dull, moderate, constant. Was at cardiologist office and sent to ED for possible cath. Pt took asa today. Denies pleuritic pain. No tearing/ripping pain. No associated sob, nv or diaphoresis. No leg pain or swelling. No heartburn or gi symptoms. No fever or chills.   The history is provided by the patient and the EMS personnel.  Chest Pain   Pertinent negatives include no abdominal pain, no back pain, no fever, no headaches and no shortness of breath.    Past Medical History:  Diagnosis Date  . Arthritis    "lower back; right shoulder; left thumb; joints" (06/21/2013)  . Asthma    "not sure if this is true or not" (06/21/2013)  . CAD (coronary artery disease) May 2015   s/p PTCA/DES x 3 to mid LAD, mod non-ob disease in Cx and RCA May 2015; s/p repeat caths - last study in November of 2015 - stents patent, - FFR - managed medically.   [name] GERD (gastroesophageal reflux disease)   . Gout    "maybe twice in my life"  . Headache(784.0)    "weekly for the last 3-4 months" (06/21/2013)  . Hyperlipidemia   . Hypertension   . Myocardial infarction [facility])    "/Dr. Katharina Caper I've had one between 2014-2015) (06/21/2013)  . Osteoarthritis   . Sleep apnea    WEIGHT LOSS, NO LONGER NEEDS PER PATIENT  . Type II diabetes mellitus ([place])    TYPE 2    Patient Active Problem List   Diagnosis Date Noted  . Coronary artery disease 11/07/2016  . Angina pectoris ([name]) 10/29/2016  . GERD (gastroesophageal reflux disease) 10/28/2016  . Coronary artery  disease due to calcified coronary lesion   . Hyperlipidemia LDL goal <70   . Overweight (BMI 25.0-29.9) 04/13/2016  . S/P right THA, AA 04/12/2016  . Angina at rest [facility]) 02/25/2015  . Type 2 diabetes mellitus treated without insulin ([place])   . Coronary atherosclerosis of native coronary artery 06/27/2013  . Unstable angina ([place]) 06/21/2013  . Chest pain 06/20/2013  . Shoulder arthritis 03/02/2012  . Hypertension 04/11/2011  . Hyperlipidemia 04/11/2011  . Diabetes mellitus ([place]) 04/11/2011  . Chest tightness 10/13/2010    Past Surgical History:  Procedure Laterality Date  . CARDIAC CATHETERIZATION  1980's   "once"  . CARDIAC CATHETERIZATION N/A 07/23/2015   Procedure: Left Heart Cath and Coronary Angiography;  Surgeon: Sherren Mocha, MD;  Location: [place] CV LAB;  Service: Cardiovascular;  Laterality: N/A;  . CARDIOVASCULAR STRESS TEST  06/10/2008   EF 68%  . CARPAL TUNNEL RELEASE Bilateral   . CORONARY ANGIOPLASTY WITH STENT PLACEMENT  06/21/2013   "3"  . CORONARY ARTERY BYPASS GRAFT N/A 11/07/2016   Procedure: CORONARY ARTERY BYPASS GRAFTING (CABG) x 5 (LIMA to DISTAL LAD, SVG to DIAGONAL, SVG to CIRCUMFLEX, and SVG SEQUENTIALLY to PLB and DISTAL PDA) with EVH of the RIGHT GREATER SAPHENOUS VEIN and LEFT INTERNAL MAMMARY ARTERY HARVEST;  Surgeon: Servando Snare,  Lilia Argue, MD;  Location: [place];  Service: Open Heart Surgery;  Laterality: N/A;  . CORONARY STENT INTERVENTION N/A 06/07/2016   Procedure: Coronary Stent Intervention;  Surgeon: Burnell Blanks, MD;  Location: [place] CV LAB;  Service: Cardiovascular;  Laterality: N/A;  . EXCISION MORTON'S NEUROMA Left   . EYE SURGERY Left    "removed film over my eye"  . FRACTIONAL FLOW RESERVE WIRE N/A 06/26/2013   Procedure: FRACTIONAL FLOW RESERVE WIRE;  Surgeon: [name], MD;  Location: [place] CATH LAB;  Service: Cardiovascular;  Laterality: N/A;  . HERNIA REPAIR Left   . INTRAVASCULAR PRESSURE WIRE/FFR STUDY N/A 10/28/2016    Procedure: INTRAVASCULAR PRESSURE WIRE/FFR STUDY;  Surgeon: Leonie Man, MD;  Location: [place] CV LAB;  Service: Cardiovascular;  Laterality: N/A;  . JOINT REPLACEMENT Left 03/2013   "thumb"  . LEFT HEART CATH AND CORONARY ANGIOGRAPHY N/A 06/07/2016   Procedure: Left Heart Cath and Coronary Angiography;  Surgeon: Burnell Blanks, MD;  Location: [place] CV LAB;  Service: Cardiovascular;  Laterality: N/A;  . LEFT HEART CATH AND CORONARY ANGIOGRAPHY N/A 10/28/2016   Procedure: LEFT HEART CATH AND CORONARY ANGIOGRAPHY;  Surgeon: Leonie Man, MD;  Location: [place] CV LAB;  Service: Cardiovascular;  Laterality: N/A;  . LEFT HEART CATHETERIZATION WITH CORONARY ANGIOGRAM N/A 06/21/2013   Procedure: LEFT HEART CATHETERIZATION WITH CORONARY ANGIOGRAM;  Surgeon: Burnell Blanks, MD;  Location: [facility] CATH LAB;  Service: Cardiovascular;  Laterality: N/A;  . LEFT HEART CATHETERIZATION WITH CORONARY ANGIOGRAM N/A 06/26/2013   Procedure: LEFT HEART CATHETERIZATION WITH CORONARY ANGIOGRAM;  Surgeon: [name], MD;  Location: [place] CATH LAB;  Service: Cardiovascular;  Laterality: N/A;  . TEE WITHOUT CARDIOVERSION N/A 11/07/2016   Procedure: TRANSESOPHAGEAL ECHOCARDIOGRAM (TEE);  Surgeon: Grace Isaac, MD;  Location: [place];  Service: Open Heart Surgery;  Laterality: N/A;  . TONSILLECTOMY    . TOTAL HIP ARTHROPLASTY Right 04/12/2016   Procedure: RIGHT TOTAL HIP ARTHROPLASTY ANTERIOR APPROACH;  Surgeon: Paralee Cancel, MD;  Location: [facility];  Service: Orthopedics;  Laterality: Right;  requests 70 mins  . TOTAL SHOULDER ARTHROPLASTY  03/01/2012   Procedure: TOTAL SHOULDER ARTHROPLASTY;  Surgeon: Marin Shutter, MD;  Location: [place];  Service: Orthopedics;  Laterality: Right;  . UMBILICAL HERNIA REPAIR          Home Medications    Prior to Admission medications   Medication Sig Start Date End Date Taking? Authorizing Provider  acetaminophen (TYLENOL) 500 MG tablet Take 1,000 mg  by mouth every 8 (eight) hours as needed (pain).     [provider]  ALPRAZolam Duanne Moron) 0.5 MG tablet Take 0.5 tablets (0.25 mg total) by mouth daily as needed for anxiety. 11/11/16   Gold, Wayne E, PA-C  amLODipine (NORVASC) 5 MG tablet TAKE ONE TABLET BY MOUTH TWICE DAILY  11/13/17   Burtis Junes, NP  aspirin 81 MG tablet Take 81 mg by mouth at bedtime.     [provider]  atenolol (TENORMIN) 50 MG tablet Take 1 tablet (50 mg total) by mouth at bedtime. 06/15/16   Burtis Junes, NP  atorvastatin (LIPITOR) 80 MG tablet Take 80 mg by mouth daily at 6 PM.    [provider]  canagliflozin (INVOKANA) 300 MG TABS tablet Take 300 mg by mouth daily before breakfast.    [provider]  Cholecalciferol (VITAMIN D PO) Take 1 capsule by mouth daily.    [provider]  clopidogrel (PLAVIX) 75 MG tablet TAKE ONE TABLET BY MOUTH ONE TIME DAILY WITH BREAKFAST 11/13/17   Burtis Junes, NP  docusate sodium (COLACE) 100 MG capsule Take 100 mg by mouth daily as needed for mild constipation.    [provider]  esomeprazole (NEXIUM) 40 MG capsule Take 1 capsule (40 mg total) by mouth 2 (two) times daily before a meal. 08/04/17   Lemmon, Lavone Nian, PA  gabapentin (NEURONTIN) 300 MG capsule Take 300 mg by mouth at bedtime.     [provider]  hydrochlorothiazide (HYDRODIURIL) 25 MG tablet Take 25 mg by mouth daily.    [provider]  Insulin Degludec (TRESIBA [place]) Inject 12 Units into the skin every morning.     [provider]  irbesartan (AVAPRO) 150 MG tablet Take 1 tablet (150 mg total) daily by mouth. Patient taking differently: Take 300 mg by mouth daily.  12/20/16   Burtis Junes, NP  metFORMIN (GLUCOPHAGE-XR) 500 MG 24 hr tablet Take 500 mg by mouth 2 (two) times daily.    [provider]  Multiple Vitamin (MULTIVITAMIN) tablet Take 1 tablet by mouth daily.    [provider]  nitroGLYCERIN  (NITROSTAT) 0.4 MG SL tablet Place 1 tablet (0.4 mg total) under the tongue every 5 (five) minutes as needed for chest pain. 04/12/17 04/07/18  Burtis Junes, NP  polyethylene glycol (MIRALAX / GLYCOLAX) packet Take 17 g by mouth daily as needed for mild constipation.    [provider]  ranitidine (ZANTAC) 150 MG tablet Take 1 tablet (150 mg total) by mouth 2 (two) times daily. 08/04/17   Levin Erp, PA  traMADol (ULTRAM) 50 MG tablet Take 1 tablet (50 mg total) by mouth every 6 (six) hours as needed for moderate pain. 12/08/16   Grace Isaac, MD    Family History Family History  Problem Relation Age of Onset  . Diabetes type II Mother   . Heart attack Father   . Coronary artery disease Cousin   . Heart disease Brother     Social History Social History   Tobacco Use  . Smoking status: Never Smoker  . Smokeless tobacco: Never Used  Substance Use Topics  . Alcohol use: No  . Drug use: No     Allergies   Oxycodone   Review of Systems Review of Systems  Constitutional: Negative for fever.  HENT: Negative for sore throat.   Eyes: Negative for redness.  Respiratory: Negative for shortness of breath.   Cardiovascular: Positive for chest pain. Negative for leg swelling.  Gastrointestinal: Negative for abdominal pain.  Genitourinary: Negative for flank pain.  Musculoskeletal: Negative for back pain and neck pain.  Skin: Negative for rash.  Neurological: Negative for headaches.  Hematological: Does not bruise/bleed easily.  Psychiatric/Behavioral: Negative for confusion.     Physical Exam Updated Vital Signs Temp 98.2 F (36.8 C) (Oral)   SpO2 99%   Physical Exam  Constitutional: He appears well-developed and well-nourished.  HENT:  Mouth/Throat: Oropharynx is clear and moist.  Eyes: Conjunctivae are normal.  Neck: Neck supple. No tracheal deviation present.  Cardiovascular: Normal rate, regular rhythm, normal heart sounds and intact  distal pulses. Exam reveals no gallop and no friction rub.  No murmur heard. Pulmonary/Chest: Effort normal and breath sounds normal. No accessory muscle usage. No respiratory distress. He exhibits no tenderness.  Abdominal: Soft. Bowel sounds are normal. He exhibits no distension and no mass. There is no  tenderness. There is no guarding.  Genitourinary:  Genitourinary Comments: No cva tenderness.   Musculoskeletal: He exhibits no edema or tenderness.  Neurological: He is alert.  Skin: Skin is warm and dry. No rash noted.  Psychiatric: He has a normal mood and affect.  Nursing note and vitals reviewed.    ED Treatments / Results  Labs (all labs ordered are listed, but only abnormal results are displayed) Results for orders placed or performed during the hospital encounter of 12/25/17  CBC with Differential/Platelet  Result Value Ref Range   WBC 4.7 4.0 - 10.5 K/uL   RBC 4.75 4.22 - 5.81 MIL/uL   Hemoglobin 13.3 13.0 - 17.0 g/dL   HCT 41.4 39.0 - 52.0 %   MCV 87.2 80.0 - 100.0 fL   MCH 28.0 26.0 - 34.0 pg   MCHC 32.1 30.0 - 36.0 g/dL   RDW 13.0 11.5 - 15.5 %   Platelets 145 (L) 150 - 400 K/uL   nRBC 0.0 0.0 - 0.2 %   Neutrophils Relative % 57 %   Neutro Abs 2.7 1.7 - 7.7 K/uL   Lymphocytes Relative 34 %   Lymphs Abs 1.6 0.7 - 4.0 K/uL   Monocytes Relative 8 %   Monocytes Absolute 0.4 0.1 - 1.0 K/uL   Eosinophils Relative 1 %   Eosinophils Absolute 0.0 0.0 - 0.5 K/uL   Basophils Relative 0 %   Basophils Absolute 0.0 0.0 - 0.1 K/uL   Immature Granulocytes 0 %   Abs Immature Granulocytes 0.01 0.00 - 0.07 K/uL  Protime-INR  Result Value Ref Range   Prothrombin Time 13.7 11.4 - 15.2 seconds   INR 1.06   APTT  Result Value Ref Range   aPTT 33 24 - 36 seconds  Comprehensive metabolic panel  Result Value Ref Range   Sodium 137 135 - 145 mmol/L   Potassium 3.6 3.5 - 5.1 mmol/L   Chloride 103 98 - 111 mmol/L   CO2 26 22 - 32 mmol/L   Glucose, Bld 197 (H) 70 - 99 mg/dL    BUN 25 (H) 8 - 23 mg/dL   Creatinine, Ser 1.04 0.61 - 1.24 mg/dL   Calcium 9.2 8.9 - 10.3 mg/dL   Total Protein 6.4 (L) 6.5 - 8.1 g/dL   Albumin 3.9 3.5 - 5.0 g/dL   AST 34 15 - 41 U/L   ALT 42 0 - 44 U/L   Alkaline Phosphatase 71 38 - 126 U/L   Total Bilirubin 0.7 0.3 - 1.2 mg/dL   GFR calc non Af Amer >60 >60 mL/min   GFR calc Af Amer >60 >60 mL/min   Anion gap 8 5 - 15  Troponin I STAT - ONCE  Result Value Ref Range   Troponin I <0.03 <0.03 ng/mL  Lipid panel  Result Value Ref Range   Cholesterol 90 0 - 200 mg/dL   Triglycerides 89 <150 mg/dL   HDL 36 (L) >40 mg/dL   Total CHOL/HDL Ratio 2.5 RATIO   VLDL 18 0 - 40 mg/dL   LDL Cholesterol 36 0 - 99 mg/dL    EKG EKG Interpretation  Date/Time:  Monday December 25 2017 11:52:23 EST Ventricular Rate:  63 PR Interval:    QRS Duration: 96 QT Interval:  402 QTC Calculation: 412 R Axis:   27 Text Interpretation:  Sinus rhythm `v mild st elev II, III, AVF, V5-v6 ?stemi Confirmed by Lajean Saver [phone]) on 12/25/2017 12:00:37 PM   Radiology Dg Chest [facility] 1 [address]  Result Date: 12/25/2017 CLINICAL DATA:  Chest pain EXAM: PORTABLE CHEST 1 VIEW COMPARISON:  12/08/2016 FINDINGS: The heart size and mediastinal contours are within normal limits. Both lungs are clear. The visualized skeletal structures are unremarkable. Postsurgical changes are again seen. IMPRESSION: No active disease. Electronically Signed   By: Inez Catalina M.D.   On: 12/25/2017 12:18    Procedures Procedures (including critical care time)  Medications Ordered in ED Medications  0.9 %  sodium chloride infusion (has no administration in time range)  aspirin chewable tablet 324 mg (has no administration in time range)  heparin injection 5,250 Units (has no administration in time range)     Initial Impression / Assessment and Plan / ED Course  I have reviewed the triage vital signs and the nursing notes.  Pertinent labs & imaging results that were available  during my care of the patient were reviewed by me and considered in my medical decision making (see chart for details).  Iv ns. Continuous pulse ox and monitor. Stat ecg and labs. Pt with mild st elev inf, and v5-6 on ecg, changed from prior - code stemi activated.   Reviewed nursing notes and prior charts for additional history.   Pt had asa. Heparin iv. Cardiology consulted - will take to cath lab.   Recheck chest/back discomfort improved. Awaiting cath lab.   Labs reviewed - initial trop normal.   cxr reviewed - no pna.   CRITICAL CARE  RE: acute coronary syndrome/stemi/unstable angina.  Performed by: Mirna Mires Total critical care time: 35 minutes Critical care time was exclusive of separately billable procedures and treating other patients. Critical care was necessary to treat or prevent imminent or life-threatening deterioration. Critical care was time spent personally by me on the following activities: development of treatment plan with patient and/or surrogate as well as nursing, discussions with consultants, evaluation of patient's response to treatment, examination of patient, obtaining history from patient or surrogate, ordering and performing treatments and interventions, ordering and review of laboratory studies, ordering and review of radiographic studies, pulse oximetry and re-evaluation of patient's condition.   Final Clinical Impressions(s) / ED Diagnoses   Final diagnoses:  None    ED Discharge Orders    None       Lajean Saver, MD 12/25/17 1335

## 2017-12-25 NOTE — ED Notes (Signed)
Pt was administered 324 ASA and 2 nitro PTA.

## 2017-12-25 NOTE — Patient Instructions (Addendum)
We are admitting you to the hospital today.    Call the [place] office at [phone] if you have any questions, problems or concerns.

## 2017-12-25 NOTE — Addendum Note (Signed)
Addended by: Burtis Junes on: 12/25/2017 11:41 AM   Modules accepted: Orders, SmartSet

## 2017-12-25 NOTE — Interval H&P Note (Signed)
Cath Lab Visit (complete for each Cath Lab visit)  Clinical Evaluation Leading to the Procedure:   ACS: Yes.    Non-ACS:    Anginal Classification: CCS IV  Anti-ischemic medical therapy: Minimal Therapy (1 class of medications)  Non-Invasive Test Results: No non-invasive testing performed  Prior CABG: Previous CABG      History and Physical Interval Note:  12/25/2017 2:32 PM  Hunter Mcbride.  has presented today for surgery, with the diagnosis of cp  The various methods of treatment have been discussed with the patient and family. After consideration of risks, benefits and other options for treatment, the patient has consented to  Procedure(s): LEFT HEART CATH AND CORS/GRAFTS ANGIOGRAPHY (N/A) as a surgical intervention .  The patient's history has been reviewed, patient examined, no change in status, stable for surgery.  I have reviewed the patient's chart and labs.  Questions were answered to the patient's satisfaction.     Sherren Mocha

## 2017-12-25 NOTE — ED Triage Notes (Signed)
Per EMS: pt sent from cardiologist office for CP evaluation.  Pt states CP radiates to back and left shoulder.  Pt has extensive cardiac history.

## 2017-12-25 NOTE — Telephone Encounter (Signed)
He is going to need to be seen.   If he is having active chest pain at this time - he will need to go to the ER.   [name]

## 2017-12-25 NOTE — Progress Notes (Addendum)
CARDIOLOGY OFFICE NOTE  Date:  12/25/2017    Hunter Mcbride. Date of Birth: [birth year] Medical Record #[id number]  PCP:  Crist Infante, MD  Cardiologist:  Servando Snare & Cooper/Nahser    Chief Complaint  Patient presents with  . Chest Pain  . Coronary Artery Disease    Work in visit - seen for Dr. Burt Knack    History of Present Illness: Hunter Mcbride. is a 72 y.o. male who presents today for a work in visit. Seen for Dr. Burt Knack & Nahser. Primarily sees me.   He has a [place] standing history of CAD, HTN, HLD, DM and OSA. He has had prior DES x 3 to the LAD back in 2015. Several caths since. Ended up being referred for CABG x 5 in September of 2018 by [facility]. He is Intolerant to higher doses of nitrates despite multiple attempts. Had not wanted to increase Ranexa due to cost. Has had chronic tendency to really overexert himself. It was surprising how very well his exercise tolerance is and that his angina is typically always at rest - this is chronic. He has had prior echo showing mild dilatation of the aortawith repeat studies was not identified.   I last saw him back in May - doing ok. Some fleeting chest pain but overall was felt to be stable from our standpoint.   Phone call this morning - noting several episodes of chest pain since last week. Thus added to my schedule for today.   Comes in today. Here with his wife Hunter Mcbride.He has had chest burning - in his back to the shoulder and the chest and down the left arm - has been off and on since Thursday - more so "on". Identical to his prior chest pain syndrome. [name] made him use NTG on Saturday - this brought short term relief. He did not wish to go and sit in the ER for the weekend - so he did not call. He was still able to go hunting - killed a buck on Friday but did not pull it out. He has been using "pain medicine and nerve medicine to sleep" - otherwise, he could not sleep. He wondered if this was indigestion - but notes it  is identical to his prior chest pain syndrome. No other associated symptoms - not worse with deep breathing, sitting up etc.   Past Medical History:  Diagnosis Date  . Arthritis    "lower back; right shoulder; left thumb; joints" (06/21/2013)  . Asthma    "not sure if this is true or not" (06/21/2013)  . CAD (coronary artery disease) May 2015   s/p PTCA/DES x 3 to mid LAD, mod non-ob disease in Cx and RCA May 2015; s/p repeat caths - last study in November of 2015 - stents patent, - FFR - managed medically.   [name] GERD (gastroesophageal reflux disease)   . Gout    "maybe twice in my life"  . Headache(784.0)    "weekly for the last 3-4 months" (06/21/2013)  . Hyperlipidemia   . Hypertension   . Myocardial infarction [facility])    "/Dr. Katharina Caper I've had one between 2014-2015) (06/21/2013)  . Osteoarthritis   . Sleep apnea    WEIGHT LOSS, NO LONGER NEEDS PER PATIENT  . Type II diabetes mellitus ([place])    TYPE 2    Past Surgical History:  Procedure Laterality Date  . CARDIAC CATHETERIZATION  1980's   "once"  . CARDIAC CATHETERIZATION N/A 07/23/2015  Procedure: Left Heart Cath and Coronary Angiography;  Surgeon: Sherren Mocha, MD;  Location: [place] [facility];  Service: Cardiovascular;  Laterality: N/A;  . CARDIOVASCULAR STRESS TEST  06/10/2008   EF 68%  . CARPAL TUNNEL RELEASE Bilateral   . CORONARY ANGIOPLASTY WITH STENT PLACEMENT  06/21/2013   "3"  . CORONARY ARTERY BYPASS GRAFT N/A 11/07/2016   Procedure: CORONARY ARTERY BYPASS GRAFTING (CABG) x 5 (LIMA to DISTAL LAD, SVG to DIAGONAL, SVG to CIRCUMFLEX, and SVG SEQUENTIALLY to PLB and DISTAL PDA) with EVH of the RIGHT GREATER SAPHENOUS VEIN and LEFT INTERNAL MAMMARY ARTERY HARVEST;  Surgeon: Grace Isaac, MD;  Location: [place];  Service: Open Heart Surgery;  Laterality: N/A;  . CORONARY STENT INTERVENTION N/A 06/07/2016   Procedure: Coronary Stent Intervention;  Surgeon: Burnell Blanks, MD;  Location: [place] [facility];  Service:  Cardiovascular;  Laterality: N/A;  . EXCISION MORTON'S NEUROMA Left   . EYE SURGERY Left    "removed film over my eye"  . FRACTIONAL FLOW RESERVE WIRE N/A 06/26/2013   Procedure: FRACTIONAL FLOW RESERVE WIRE;  Surgeon: [name], MD;  Location: [place] [facility];  Service: Cardiovascular;  Laterality: N/A;  . HERNIA REPAIR Left   . INTRAVASCULAR PRESSURE WIRE/FFR STUDY N/A 10/28/2016   Procedure: INTRAVASCULAR PRESSURE WIRE/FFR STUDY;  Surgeon: Leonie Man, MD;  Location: [place] [facility];  Service: Cardiovascular;  Laterality: N/A;  . JOINT REPLACEMENT Left 03/2013   "thumb"  . LEFT HEART CATH AND CORONARY ANGIOGRAPHY N/A 06/07/2016   Procedure: Left Heart Cath and Coronary Angiography;  Surgeon: Burnell Blanks, MD;  Location: [place] [facility];  Service: Cardiovascular;  Laterality: N/A;  . LEFT HEART CATH AND CORONARY ANGIOGRAPHY N/A 10/28/2016   Procedure: LEFT HEART CATH AND CORONARY ANGIOGRAPHY;  Surgeon: Leonie Man, MD;  Location: [place] [facility];  Service: Cardiovascular;  Laterality: N/A;  . LEFT HEART CATHETERIZATION WITH CORONARY ANGIOGRAM N/A 06/21/2013   Procedure: LEFT HEART CATHETERIZATION WITH CORONARY ANGIOGRAM;  Surgeon: Burnell Blanks, MD;  Location: [facility];  Service: Cardiovascular;  Laterality: N/A;  . LEFT HEART CATHETERIZATION WITH CORONARY ANGIOGRAM N/A 06/26/2013   Procedure: LEFT HEART CATHETERIZATION WITH CORONARY ANGIOGRAM;  Surgeon: [name], MD;  Location: [place] [facility];  Service: Cardiovascular;  Laterality: N/A;  . TEE WITHOUT CARDIOVERSION N/A 11/07/2016   Procedure: TRANSESOPHAGEAL ECHOCARDIOGRAM (TEE);  Surgeon: Grace Isaac, MD;  Location: [place];  Service: Open Heart Surgery;  Laterality: N/A;  . TONSILLECTOMY    . TOTAL HIP ARTHROPLASTY Right 04/12/2016   Procedure: RIGHT TOTAL HIP ARTHROPLASTY ANTERIOR APPROACH;  Surgeon: Paralee Cancel, MD;  Location: [facility];  Service: Orthopedics;  Laterality: Right;  requests 70  mins  . TOTAL SHOULDER ARTHROPLASTY  03/01/2012   Procedure: TOTAL SHOULDER ARTHROPLASTY;  Surgeon: Marin Shutter, MD;  Location: [place];  Service: Orthopedics;  Laterality: Right;  . UMBILICAL HERNIA REPAIR       Medications: Current Meds  Medication Sig  . acetaminophen (TYLENOL) 500 MG tablet Take 1,000 mg by mouth every 8 (eight) hours as needed (pain).   [name] ALPRAZolam (XANAX) 0.5 MG tablet Take 0.5 tablets (0.25 mg total) by mouth daily as needed for anxiety.  [name] amLODipine (NORVASC) 5 MG tablet TAKE ONE TABLET BY MOUTH TWICE DAILY   . aspirin 81 MG tablet Take 81 mg by mouth at bedtime.   [name] atenolol (TENORMIN) 50 MG tablet Take 1 tablet (50 mg total)  by mouth at bedtime.  [name] atorvastatin (LIPITOR) 80 MG tablet Take 80 mg by mouth daily at 6 PM.  . canagliflozin (INVOKANA) 300 MG TABS tablet Take 300 mg by mouth daily before breakfast.  . Cholecalciferol (VITAMIN D PO) Take 1 capsule by mouth daily.  . clopidogrel (PLAVIX) 75 MG tablet TAKE ONE TABLET BY MOUTH ONE TIME DAILY WITH BREAKFAST  . docusate sodium (COLACE) 100 MG capsule Take 100 mg by mouth daily as needed for mild constipation.  [name] esomeprazole (NEXIUM) 40 MG capsule Take 1 capsule (40 mg total) by mouth 2 (two) times daily before a meal.  . gabapentin (NEURONTIN) 300 MG capsule Take 300 mg by mouth at bedtime.   . hydrochlorothiazide (HYDRODIURIL) 25 MG tablet Take 25 mg by mouth daily.  . Insulin Degludec (TRESIBA Echo) Inject 12 Units into the skin every morning.   . irbesartan (AVAPRO) 150 MG tablet Take 1 tablet (150 mg total) daily by mouth. (Patient taking differently: Take 300 mg by mouth daily. )  . metFORMIN (GLUCOPHAGE-XR) 500 MG 24 hr tablet Take 500 mg by mouth 2 (two) times daily.  . Multiple Vitamin (MULTIVITAMIN) tablet Take 1 tablet by mouth daily.  . nitroGLYCERIN (NITROSTAT) 0.4 MG SL tablet Place 1 tablet (0.4 mg total) under the tongue every 5 (five) minutes as needed for chest pain.  . polyethylene glycol  (MIRALAX / GLYCOLAX) packet Take 17 g by mouth daily as needed for mild constipation.  . ranitidine (ZANTAC) 150 MG tablet Take 1 tablet (150 mg total) by mouth 2 (two) times daily.  . traMADol (ULTRAM) 50 MG tablet Take 1 tablet (50 mg total) by mouth every 6 (six) hours as needed for moderate pain.   Current Facility-Administered Medications for the 12/25/17 encounter (Office Visit) with Burtis Junes, NP  Medication  . 0.9 %  sodium chloride infusion     Allergies: Allergies  Allergen Reactions  . Oxycodone Nausea Only    Social History: The patient  reports that he has never smoked. He has never used smokeless tobacco. He reports that he does not drink alcohol or use drugs.   Family History: The patient's family history includes Coronary artery disease in his cousin; Diabetes type II in his mother; Heart attack in his father; Heart disease in his brother.   Review of Systems: Please see the history of present illness.   Otherwise, the review of systems is positive for none.   All other systems are reviewed and negative.   Physical Exam: VS:  BP 132/80 (BP Location: Left Arm, Patient Position: Sitting, Cuff Size: Normal)   Pulse (!) 57   Ht 5\' 9"  (1.753 m)   Wt 193 lb 6.4 oz (87.7 kg)   BMI 28.56 kg/m  .  BMI Body mass index is 28.56 kg/m.  Wt Readings from Last 3 Encounters:  12/25/17 193 lb 6.4 oz (87.7 kg)  10/12/17 191 lb 6 oz (86.8 kg)  09/14/17 193 lb (87.5 kg)    General: He is alert and in no acute distress.  He complains of active chest pain during this visit - worsened during the course of the visit.  HEENT: Normal.  Neck: Supple, no JVD, carotid bruits, or masses noted.  Cardiac: Regular rate and rhythm. No murmurs, rubs, or gallops. No edema.  Respiratory:  Lungs are clear to auscultation bilaterally with normal work of breathing.  GI: Soft and nontender.  MS: No deformity or atrophy. Gait and ROM intact.  Skin: Warm and  dry. Color is normal.  Neuro:   Strength and sensation are intact and no gross focal deficits noted.  Psych: Alert, appropriate and with normal affect.   LABORATORY DATA:  EKG:  EKG is ordered today. This demonstrates NSR with inferior Q's but with ? ST elevation inferolaterally - reviewed with Dr. Acie Fredrickson (DOD).  Lab Results  Component Value Date   WBC 4.9 08/28/2017   HGB 13.1 08/28/2017   HCT 39.5 08/28/2017   PLT 156.0 08/28/2017   GLUCOSE 253 (H) 08/28/2017   CHOL 84 10/28/2016   TRIG 31 10/28/2016   HDL 36 (L) 10/28/2016   LDLCALC 42 10/28/2016   ALT 37 08/28/2017   AST 19 08/28/2017   NA 141 08/28/2017   K 5.0 08/28/2017   CL 103 08/28/2017   CREATININE 1.22 08/28/2017   BUN 24 (H) 08/28/2017   CO2 30 08/28/2017   TSH 2.009 06/06/2016   INR 1.51 11/07/2016   HGBA1C 7.5 (H) 11/03/2016     BNP (last 3 results) No results for input(s): BNP in the last 8760 hours.  ProBNP (last 3 results) No results for input(s): PROBNP in the last 8760 hours.   Other Studies Reviewed Today:  SURGICAL PROCEDURE9/2018:  Coronary artery bypass grafting x5 with the left internal mammary to the distal left anterior descending coronary artery, reverse saphenous vein graft to the diagonal, reverse saphenous vein graft to the first obtuse marginal, sequential reverse saphenous vein graft to the proximal posterior lateral branch and to the distal posterior descending with right leg, thigh, and calf greater saphenous vein harvesting endoscopically.  Assessment/Plan:  1. CAD with history of chronic angina - prior DES x 3 to the LAD, he has had PCI with DES x 1 to the proximal PLA and balloon PCI to the proximal PDA -now s/p CABG x 5 -with LIMA to distal left anterior descending coronary artery, SVG to the diagonal, SVG graft to the first obtuse marginal, sequential SVG to the proximal posterior lateral branch and to the distal posterior descending  - now with recurrent angina - with possible EKG changes - reviewed  with Dr. Acie Fredrickson who has subsequently seen the patient as well with me today - transferring by EMS to the [facility] ER - possible cath today - needs iStat troponin. 4 baby aspirin given along with 1 sl NTG here with mild improvement.   Will hold Metformin, HCTZ and Invokana with upcoming cath planned.   2. HTN -BP fair.    3. HLD - on statintherapy - recent labs noted from his physical.   Current medicines are reviewed with the patient today.  The patient does not have concerns regarding medicines other than what has been noted above.  The following changes have been made:  See above.  Labs/ tests ordered today include:    Orders Placed This Encounter  Procedures  . EKG 12-Lead     Disposition:   Further disposition pending. Transferred by EMS.    Patient is agreeable to this plan and will call if any problems develop in the interim.   SignedTruitt Merle, NP  12/25/2017 11:18 AM  [facility] [address] [place], [place]  [address] Phone: [phone] Fax: [phone]

## 2017-12-25 NOTE — Progress Notes (Signed)
TR band off, gauze and transparent dressing placed, level 0.  Patient ambulated with RN in the hallway, tolerated well, denied pain.  Patient would still like to be discharged at this time.  Cardiology on call paged with this information via Amion.

## 2017-12-25 NOTE — H&P (Signed)
Expand All Collapse All       CARDIOLOGY OFFICE NOTE  Date:  12/25/2017    [name] Date of Birth: 1945/09/05 Medical Record #[id number]  PCP:  Crist Infante, MD           Cardiologist:  Servando Snare & Cooper/Nahser        Chief Complaint  Patient presents with  . Chest Pain  . Coronary Artery Disease    Work in visit - seen for Dr. Burt Knack    History of Present Illness: Hunter Mcbride. is a 72 y.o. male who presents today for a work in visit. Seen for Dr. Burt Knack & Nahser. Primarily sees me.   He has a Pai standing history ofCAD, HTN, HLD, DM and OSA. He has had prior DES x 3 to the LAD back in 2015. Several caths since. Ended up being referred for CABG x 5 in September of 2018 by [facility]. He is Intolerant to higher doses of nitrates despite multiple attempts. Hadnot wanted to increase Ranexa due to cost. Has had chronic tendency to really overexert himself. It wassurprising how very well his exercise tolerance is and that his angina is typically always at rest - this is chronic. He has had prior echo showing mild dilatation of the aortawith repeat studieswasnot identified.   I last saw him back in May - doing ok. Some fleeting chest pain but overall was felt to be stable from our standpoint.   Phone call this morning - noting several episodes of chest pain since last week. Thus added to my schedule for today.   Comes in today. Here with his wife Hunter Mcbride.He has had chest burning - in his back to the shoulder and the chest and down the left arm - has been off and on since Thursday - more so "on". Identical to his prior chest pain syndrome. [name] made him use NTG on Saturday - this brought short term relief. He did not wish to go and sit in the ER for the weekend - so he did not call. He was still able to go hunting - killed a buck on Friday but did not pull it out. He has been using "pain medicine and nerve medicine to sleep" - otherwise, he could  not sleep. He wondered if this was indigestion - but notes it is identical to his prior chest pain syndrome. No other associated symptoms - not worse with deep breathing, sitting up etc.       Past Medical History:  Diagnosis Date  . Arthritis    "lower back; right shoulder; left thumb; joints" (06/21/2013)  . Asthma    "not sure if this is true or not" (06/21/2013)  . CAD (coronary artery disease) May 2015   s/p PTCA/DES x 3 to mid LAD, mod non-ob disease in Cx and RCA May 2015; s/p repeat caths - last study in November of 2015 - stents patent, - FFR - managed medically.   [name] GERD (gastroesophageal reflux disease)   . Gout    "maybe twice in my life"  . Headache(784.0)    "weekly for the last 3-4 months" (06/21/2013)  . Hyperlipidemia   . Hypertension   . Myocardial infarction [facility])    "/Dr. Katharina Caper I've had one between 2014-2015) (06/21/2013)  . Osteoarthritis   . Sleep apnea    WEIGHT LOSS, NO LONGER NEEDS PER PATIENT  . Type II diabetes mellitus ([place])    TYPE 2  Past Surgical History:  Procedure Laterality Date  . CARDIAC CATHETERIZATION  1980's   "once"  . CARDIAC CATHETERIZATION N/A 07/23/2015   Procedure: Left Heart Cath and Coronary Angiography;  Surgeon: Sherren Mocha, MD;  Location: [place] [facility];  Service: Cardiovascular;  Laterality: N/A;  . CARDIOVASCULAR STRESS TEST  06/10/2008   EF 68%  . CARPAL TUNNEL RELEASE Bilateral   . CORONARY ANGIOPLASTY WITH STENT PLACEMENT  06/21/2013   "3"  . CORONARY ARTERY BYPASS GRAFT N/A 11/07/2016   Procedure: CORONARY ARTERY BYPASS GRAFTING (CABG) x 5 (LIMA to DISTAL LAD, SVG to DIAGONAL, SVG to CIRCUMFLEX, and SVG SEQUENTIALLY to PLB and DISTAL PDA) with EVH of the RIGHT GREATER SAPHENOUS VEIN and LEFT INTERNAL MAMMARY ARTERY HARVEST;  Surgeon: Grace Isaac, MD;  Location: [place];  Service: Open Heart Surgery;  Laterality: N/A;  . CORONARY STENT INTERVENTION N/A 06/07/2016   Procedure: Coronary  Stent Intervention;  Surgeon: Burnell Blanks, MD;  Location: [place] [facility];  Service: Cardiovascular;  Laterality: N/A;  . EXCISION MORTON'S NEUROMA Left   . EYE SURGERY Left    "removed film over my eye"  . FRACTIONAL FLOW RESERVE WIRE N/A 06/26/2013   Procedure: FRACTIONAL FLOW RESERVE WIRE;  Surgeon: [name], MD;  Location: [place] CATH [facility];  Service: Cardiovascular;  Laterality: N/A;  . HERNIA REPAIR Left   . INTRAVASCULAR PRESSURE WIRE/FFR STUDY N/A 10/28/2016   Procedure: INTRAVASCULAR PRESSURE WIRE/FFR STUDY;  Surgeon: Leonie Man, MD;  Location: [place] [facility];  Service: Cardiovascular;  Laterality: N/A;  . JOINT REPLACEMENT Left 03/2013   "thumb"  . LEFT HEART CATH AND CORONARY ANGIOGRAPHY N/A 06/07/2016   Procedure: Left Heart Cath and Coronary Angiography;  Surgeon: Burnell Blanks, MD;  Location: [place] [facility];  Service: Cardiovascular;  Laterality: N/A;  . LEFT HEART CATH AND CORONARY ANGIOGRAPHY N/A 10/28/2016   Procedure: LEFT HEART CATH AND CORONARY ANGIOGRAPHY;  Surgeon: Leonie Man, MD;  Location: [place] [facility];  Service: Cardiovascular;  Laterality: N/A;  . LEFT HEART CATHETERIZATION WITH CORONARY ANGIOGRAM N/A 06/21/2013   Procedure: LEFT HEART CATHETERIZATION WITH CORONARY ANGIOGRAM;  Surgeon: Burnell Blanks, MD;  Location: [facility];  Service: Cardiovascular;  Laterality: N/A;  . LEFT HEART CATHETERIZATION WITH CORONARY ANGIOGRAM N/A 06/26/2013   Procedure: LEFT HEART CATHETERIZATION WITH CORONARY ANGIOGRAM;  Surgeon: [name], MD;  Location: [place] CATH LAB;  Service: Cardiovascular;  Laterality: N/A;  . TEE WITHOUT CARDIOVERSION N/A 11/07/2016   Procedure: TRANSESOPHAGEAL ECHOCARDIOGRAM (TEE);  Surgeon: Grace Isaac, MD;  Location: [place];  Service: Open Heart Surgery;  Laterality: N/A;  . TONSILLECTOMY    . TOTAL HIP ARTHROPLASTY Right 04/12/2016   Procedure: RIGHT TOTAL HIP ARTHROPLASTY ANTERIOR  APPROACH;  Surgeon: Paralee Cancel, MD;  Location: [facility];  Service: Orthopedics;  Laterality: Right;  requests 70 mins  . TOTAL SHOULDER ARTHROPLASTY  03/01/2012   Procedure: TOTAL SHOULDER ARTHROPLASTY;  Surgeon: Marin Shutter, MD;  Location: [place];  Service: Orthopedics;  Laterality: Right;  . UMBILICAL HERNIA REPAIR       Medications: ActiveMedications      Current Meds  Medication Sig  . acetaminophen (TYLENOL) 500 MG tablet Take 1,000 mg by mouth every 8 (eight) hours as needed (pain).   [name] ALPRAZolam (XANAX) 0.5 MG tablet Take 0.5 tablets (0.25 mg total) by mouth daily as needed for anxiety.  [name] amLODipine (NORVASC) 5 MG tablet TAKE ONE TABLET BY MOUTH  TWICE DAILY   . aspirin 81 MG tablet Take 81 mg by mouth at bedtime.   [name] atenolol (TENORMIN) 50 MG tablet Take 1 tablet (50 mg total) by mouth at bedtime.  [name] atorvastatin (LIPITOR) 80 MG tablet Take 80 mg by mouth daily at 6 PM.  . canagliflozin (INVOKANA) 300 MG TABS tablet Take 300 mg by mouth daily before breakfast.  . Cholecalciferol (VITAMIN D PO) Take 1 capsule by mouth daily.  . clopidogrel (PLAVIX) 75 MG tablet TAKE ONE TABLET BY MOUTH ONE TIME DAILY WITH BREAKFAST  . docusate sodium (COLACE) 100 MG capsule Take 100 mg by mouth daily as needed for mild constipation.  [name] esomeprazole (NEXIUM) 40 MG capsule Take 1 capsule (40 mg total) by mouth 2 (two) times daily before a meal.  . gabapentin (NEURONTIN) 300 MG capsule Take 300 mg by mouth at bedtime.   . hydrochlorothiazide (HYDRODIURIL) 25 MG tablet Take 25 mg by mouth daily.  . Insulin Degludec (TRESIBA [place]) Inject 12 Units into the skin every morning.   . irbesartan (AVAPRO) 150 MG tablet Take 1 tablet (150 mg total) daily by mouth. (Patient taking differently: Take 300 mg by mouth daily. )  . metFORMIN (GLUCOPHAGE-XR) 500 MG 24 hr tablet Take 500 mg by mouth 2 (two) times daily.  . Multiple Vitamin (MULTIVITAMIN) tablet Take 1 tablet by mouth daily.  . nitroGLYCERIN  (NITROSTAT) 0.4 MG SL tablet Place 1 tablet (0.4 mg total) under the tongue every 5 (five) minutes as needed for chest pain.  . polyethylene glycol (MIRALAX / GLYCOLAX) packet Take 17 g by mouth daily as needed for mild constipation.  . ranitidine (ZANTAC) 150 MG tablet Take 1 tablet (150 mg total) by mouth 2 (two) times daily.  . traMADol (ULTRAM) 50 MG tablet Take 1 tablet (50 mg total) by mouth every 6 (six) hours as needed for moderate pain.      Current Facility-Administered Medications for the 12/25/17 encounter (Office Visit) with Burtis Junes, NP  Medication  . 0.9 %  sodium chloride infusion       Allergies:     Allergies  Allergen Reactions  . Oxycodone Nausea Only    Social History: The patient  reports that he has never smoked. He has never used smokeless tobacco. He reports that he does not drink alcohol or use drugs.   Family History: The patient's family history includes Coronary artery disease in his cousin; Diabetes type II in his mother; Heart attack in his father; Heart disease in his brother.   Review of Systems: Please see the history of present illness.   Otherwise, the review of systems is positive for none.   All other systems are reviewed and negative.   Physical Exam: VS:  BP 132/80 (BP Location: Left Arm, Patient Position: Sitting, Cuff Size: Normal)   Pulse (!) 57   Ht 5\' 9"  (1.753 m)   Wt 193 lb 6.4 oz (87.7 kg)   BMI 28.56 kg/m  .  BMI Body mass index is 28.56 kg/m.     Wt Readings from Last 3 Encounters:  12/25/17 193 lb 6.4 oz (87.7 kg)  10/12/17 191 lb 6 oz (86.8 kg)  09/14/17 193 lb (87.5 kg)    General: He is alert and in no acute distress.  He complains of active chest pain during this visit - worsened during the course of the visit.  HEENT: Normal.  Neck: Supple, no JVD, carotid bruits, or masses noted.  Cardiac: Regular rate  and rhythm. No murmurs, rubs, or gallops. No edema.  Respiratory:  Lungs are clear to  auscultation bilaterally with normal work of breathing.  GI: Soft and nontender.  MS: No deformity or atrophy. Gait and ROM intact.  Skin: Warm and dry. Color is normal.  Neuro:  Strength and sensation are intact and no gross focal deficits noted.  Psych: Alert, appropriate and with normal affect.   LABORATORY DATA:  EKG:  EKG is ordered today. This demonstrates NSR with inferior Q's but with ? ST elevation inferolaterally - reviewed with Dr. Acie Fredrickson (DOD).  RecentLabs       Lab Results  Component Value Date   WBC 4.9 08/28/2017   HGB 13.1 08/28/2017   HCT 39.5 08/28/2017   PLT 156.0 08/28/2017   GLUCOSE 253 (H) 08/28/2017   CHOL 84 10/28/2016   TRIG 31 10/28/2016   HDL 36 (L) 10/28/2016   LDLCALC 42 10/28/2016   ALT 37 08/28/2017   AST 19 08/28/2017   NA 141 08/28/2017   K 5.0 08/28/2017   CL 103 08/28/2017   CREATININE 1.22 08/28/2017   BUN 24 (H) 08/28/2017   CO2 30 08/28/2017   TSH 2.009 06/06/2016   INR 1.51 11/07/2016   HGBA1C 7.5 (H) 11/03/2016       BNP (last 3 results) RecentLabs(withinlast365days)  No results for input(s): BNP in the last 8760 hours.    ProBNP (last 3 results) RecentLabs(withinlast365days)  No results for input(s): PROBNP in the last 8760 hours.     Other Studies Reviewed Today:  SURGICAL PROCEDURE9/2018:  Coronary artery bypass grafting x5 with the left internal mammary to the distal left anterior descending coronary artery, reverse saphenous vein graft to the diagonal, reverse saphenous vein graft to the first obtuse marginal, sequential reverse saphenous vein graft to the proximal posterior lateral branch and to the distal posterior descending with right leg, thigh, and calf greater saphenous vein harvesting endoscopically.  Assessment/Plan:  1. CAD with history of chronic angina - prior DES x 3 to the LAD, he has had PCI with DES x 1 to the proximal PLA and balloon PCI to the  proximal PDA -now s/p CABG x 5 -with LIMA to distal left anterior descending coronary artery, SVG to the diagonal, SVG graft to the first obtuse marginal, sequential SVG to the proximal posterior lateral branch and to the distal posterior descending  - now with recurrent angina - with possible EKG changes - reviewed with Dr. Acie Fredrickson who has subsequently seen the patient as well with me today - transferring by EMS to the [facility] ER - possible cath today - needs iStat troponin. 4 baby aspirin given along with 1 sl NTG here with mild improvement.   Will hold Metformin, HCTZ and Invokana with upcoming cath planned.   2. HTN -BP fair.   3. HLD - on statintherapy- recent labs noted from his physical.  Current medicines are reviewed with the patient today.  The patient does not have concerns regarding medicines other than what has been noted above.  The following changes have been made:  See above.  Labs/ tests ordered today include:       Orders Placed This Encounter  Procedures  . EKG 12-Lead     Disposition:   Further disposition pending. Transferred by EMS.    Patient is agreeable to this plan and will call if any problems develop in the interim.   SignedTruitt Merle, NP  12/25/2017 11:18 AM  [facility] [place]  [address] [place], [place]  [address] Phone: [phone] Fax: [phone]           Electronically signed by Burtis Junes, NP at 12/25/2017 11:30 AM Electronically signed by Burtis Junes, NP at 12/25/2017 11:41 AM     Office Visit on 12/25/2017       Revision History       Detailed Report

## 2017-12-25 NOTE — Progress Notes (Signed)
Spoke to nurse, pt still has air in TR band to deflate. Will s/o to fellow to f/u and place DC order if stable and patient still wants to go. Everything else for the dc is done. Yamila Cragin PA-C

## 2017-12-25 NOTE — Telephone Encounter (Signed)
S/w pt's wife is not having active CP or back pain, is getting truck inspected but will be here for a work in visit today by 10:30 am.  Pt's wife know's might have to sit for awhile.

## 2017-12-25 NOTE — Discharge Summary (Addendum)
Discharge Summary    Patient ID: Hunter Mcbride. MRN: [id number]; DOB: 07-26-1945  Admit date: 12/25/2017 Discharge date: 12/25/2017  Primary Care Provider: Crist Infante, MD  Primary Cardiologist: Sherren Mocha, MD  Primary Electrophysiologist:  None   Discharge Diagnoses    Principal Problem:   Chest pain Active Problems:   Diabetes mellitus ([place])   Coronary atherosclerosis of native coronary artery   Type 2 diabetes mellitus treated without insulin ([facility])   Hyperlipidemia LDL goal <70   GERD (gastroesophageal reflux disease)   Allergies Allergies  Allergen Reactions  . Oxycodone Nausea Only    Diagnostic Studies/Procedures    1. Cardiac catheterization this admission, please see full report and below for summary. _____________   History of Present Illness     Hunter Mcbride is a 59M with CAD (s/p DESx3 to LAD in 2015, CABGx5 in 10/2016), intolerant to higher doses of nitrates, arthritis, asthma, GERD, gout, HTN, HLD, sleep apnea, DM   He was seen in the office today with episodes of chest pain since last week. This was described as chest burning into his back, shoulder, and down the left arm, similar to prior angina. His wife made him use NTG on Saturday. This brought short term relief. He did not wish to go and sit in the ER for the weekend, so he did not call. He was still able to go hunting and killed a buck on Friday but did not pull it out. He wondered if this was indigestion but wanted to get it checked out, hence called the office to be seen. There were no other associated symptoms and it was not pleuritic. It was not worse with deep breathing or sitting up.EKG showed NSR with interior Q's but question of ST elevation inferiorly. He was therefore sent to the hospital for admission.  Hospital Course     He underwent cath given ongoing chest pain. In the hospital, VSS. He was not tachycardic, tachypneic or hypoxic. Acute labs showed normal CBC, LDL 36, troponin  negative, A1C 7.5, mildly elevated BUN of 25 but normal creatinine. Cardiac cath by Dr. Burt Knack showed:  1.  Patent left mainstem 2.  Patent LAD with mild diffuse stenosis, severe diagonal stenosis with a patent graft 3.  Severe first obtuse marginal stenosis with a patent graft  4.  Patent RCA with mild nonobstructive disease and severe PDA stenosis with patent graft to the PDA and PLA branches 5.  Atretic LIMA to LAD, LAD supplied via antegrade native vessel flow as well as retrograde flow from the diagonal branch saphenous vein graft. 6.  Normal LVEDP  Dr. Burt Knack recommended ongoing medical therapy without any specific medicine change. He felt if patient was chest pain free this evening that hospital discharge would be reasonable. The patient did well post-cath without recurrent symptoms. Follow-up has been arranged. Dr. Burt Knack has seen and examined the patient today and feels he is stable for discharge. He was asked to hold his metformin for at least 48 hours and resume 11/14. Nexium was changed to Protonix. He was previously on this but it was changed by GI over the summer due to issues with his stomach. He has also been ranitidine per their instruction. Given interaction with Nexium and Plavix, PPI was changed back to Protonix for now and he was encouraged to discuss plan for ongoing therapy with GI team. His wife had indicated at one point he was told he could discontinue these, but given no acute cath intervention, may need  to revisit plan ongoing optimization. _____________  Discharge Vitals Blood pressure 119/72, pulse (!) 54, temperature 98.2 F (36.8 C), temperature source Oral, resp. rate 17, SpO2 98 %.  There were no vitals filed for this visit.  Labs & Radiologic Studies    CBC Recent Labs    12/25/17 1154 12/25/17 1553  WBC 4.7 4.1  NEUTROABS 2.7 2.1  HGB 13.3 13.0  HCT 41.4 39.4  MCV 87.2 85.1  PLT 145* 062*   Basic Metabolic Panel Recent Labs    12/25/17 1154  12/25/17 1553  NA 137 138  K 3.6 3.3*  CL 103 104  CO2 26 29  GLUCOSE 197* 96  BUN 25* 23  CREATININE 1.04 0.98  CALCIUM 9.2 9.1  MG  --  2.2   Liver Function Tests Recent Labs    12/25/17 1154 12/25/17 1553  AST 34 29  ALT 42 38  ALKPHOS 71 69  BILITOT 0.7 1.0  PROT 6.4* 6.2*  ALBUMIN 3.9 3.7    Cardiac Enzymes Recent Labs    12/25/17 1154  TROPONINI <0.03   Hemoglobin A1C Recent Labs    12/25/17 1553  HGBA1C 7.5*   Fasting Lipid Panel Recent Labs    12/25/17 1154  CHOL 90  HDL 36*  LDLCALC 36  TRIG 89  CHOLHDL 2.5   _____________  Dg Chest Port 1 View  Result Date: 12/25/2017 CLINICAL DATA:  Chest pain EXAM: PORTABLE CHEST 1 VIEW COMPARISON:  12/08/2016 FINDINGS: The heart size and mediastinal contours are within normal limits. Both lungs are clear. The visualized skeletal structures are unremarkable. Postsurgical changes are again seen. IMPRESSION: No active disease. Electronically Signed   By: Inez Catalina M.D.   On: 12/25/2017 12:18   Disposition   Pt is being discharged home today in good condition.  Follow-up Plans & Appointments    Follow-up Information    Burtis Junes, NP Follow up.   Specialties:  Nurse Practitioner, Interventional Cardiology, Cardiology, Radiology Why:  [facility] - 01/02/18 at 11am. Please arrive 15 minutes before to check in. Contact information: [place]. [address] [place] [address] [phone]          Discharge Instructions    Diet - low sodium heart healthy   Complete by:  As directed    Discharge instructions   Complete by:  As directed    Please hold your metformin for at least 48 hours after your cath. You can restart the morning of 12/28/17.  Some studies suggest Nexium/Esomeprazole interacts with Plavix. We changed your Nexium/Esomeprazole to the equivalent dose of Protonix for less chance of interaction. Please discuss ongoing use of this medicine, as well as ranitidine, with  your gastrointestinal (GI) doctor/team.   Increase activity slowly   Complete by:  As directed    No driving for 2 days. No lifting over 5 lbs for 1 week. No sexual activity for 1 week. Keep procedure site clean & dry. If you notice increased pain, swelling, bleeding or pus, call/return!  You may shower, but no soaking baths/hot tubs/pools for 1 week.      Discharge Medications   Allergies as of 12/25/2017      Reactions   Oxycodone Nausea Only      Medication List    STOP taking these medications   esomeprazole 40 MG capsule Commonly known as:  [place] by:  pantoprazole 40 MG tablet     TAKE these medications   acetaminophen 500 MG tablet  Commonly known as:  TYLENOL Take 1,000 mg by mouth every 8 (eight) hours as needed (pain).   ALPRAZolam 0.5 MG tablet Commonly known as:  XANAX Take 0.5 tablets (0.25 mg total) by mouth daily as needed for anxiety.   amLODipine 5 MG tablet Commonly known as:  NORVASC TAKE ONE TABLET BY MOUTH TWICE DAILY   aspirin 81 MG tablet Take 81 mg by mouth at bedtime.   atenolol 50 MG tablet Commonly known as:  TENORMIN Take 1 tablet (50 mg total) by mouth at bedtime.   atorvastatin 80 MG tablet Commonly known as:  LIPITOR Take 80 mg by mouth daily at 6 PM.   clopidogrel 75 MG tablet Commonly known as:  PLAVIX TAKE ONE TABLET BY MOUTH ONE TIME DAILY WITH BREAKFAST What changed:  See the new instructions.   docusate sodium 100 MG capsule Commonly known as:  COLACE Take 100 mg by mouth daily as needed for mild constipation.   gabapentin 300 MG capsule Commonly known as:  NEURONTIN Take 300 mg by mouth at bedtime.   hydrochlorothiazide 25 MG tablet Commonly known as:  HYDRODIURIL Take 25 mg by mouth daily.   INVOKANA 300 MG Tabs tablet Generic drug:  canagliflozin Take 300 mg by mouth daily before breakfast.   irbesartan 300 MG tablet Commonly known as:  AVAPRO Take 300 mg by mouth daily. Wife clarified Dr Joylene Draft  increased recently and has 300mg  tablet   metFORMIN 500 MG 24 hr tablet Commonly known as:  GLUCOPHAGE-XR Take 500 mg by mouth 2 (two) times daily. Next dose due: Please hold your metformin for at least 48 hours after your cath. You can restart the morning of 12/28/17.    multivitamin tablet Take 1 tablet by mouth daily.   nitroGLYCERIN 0.4 MG SL tablet Commonly known as:  NITROSTAT Place 1 tablet (0.4 mg total) under the tongue every 5 (five) minutes as needed for chest pain.   pantoprazole 40 MG tablet Commonly known as:  PROTONIX Take 1 tablet (40 mg total) by mouth 2 (two) times daily before a meal. Replaces:  esomeprazole 40 MG capsule   polyethylene glycol packet Commonly known as:  MIRALAX / GLYCOLAX Take 17 g by mouth daily as needed for mild constipation.   ranitidine 150 MG tablet Commonly known as:  ZANTAC Take 1 tablet (150 mg total) by mouth 2 (two) times daily.   traMADol 50 MG tablet Commonly known as:  ULTRAM Take 1 tablet (50 mg total) by mouth every 6 (six) hours as needed for moderate pain.   TRESIBA [place] Inject 12 Units into the skin every morning.   VITAMIN D PO Take 1 capsule by mouth daily.         Outstanding Labs/Studies   N/A  Duration of Discharge Encounter   Greater than 30 minutes including physician time.  Signed, [name], PA-C 12/25/2017, 5:08 PM

## 2017-12-25 NOTE — Telephone Encounter (Signed)
New Message   Pt c/o of Chest Pain: STAT if CP now or developed within 24 hours  1. Are you having CP right now? no  2. Are you experiencing any other symptoms (ex. SOB, nausea, vomiting, sweating)? No symptoms  3. How [place] have you been experiencing CP? Since Thursday of last week   4. Is your CP continuous or coming and going? Coming and going   5. Have you taken Nitroglycerin? Yes, took nitro on Saturday night ? Patients wife describes the pain as starting at the back and comes to the front.

## 2017-12-25 NOTE — H&P (View-Only) (Signed)
CARDIOLOGY OFFICE NOTE  Date:  12/25/2017    Hunter Mcbride. Date of Birth: 06-15-1945 Medical Record #[id number]  PCP:  Crist Infante, MD  Cardiologist:  Servando Snare & Cooper/Nahser    Chief Complaint  Patient presents with  . Chest Pain  . Coronary Artery Disease    Work in visit - seen for Dr. Burt Knack    History of Present Illness: Hunter Mcbride. is a 72 y.o. male who presents today for a work in visit. Seen for Dr. Burt Knack & Nahser. Primarily sees me.   He has a Mauger standing history of CAD, HTN, HLD, DM and OSA. He has had prior DES x 3 to the LAD back in 2015. Several caths since. Ended up being referred for CABG x 5 in September of 2018 by [facility]. He is Intolerant to higher doses of nitrates despite multiple attempts. Had not wanted to increase Ranexa due to cost. Has had chronic tendency to really overexert himself. It was surprising how very well his exercise tolerance is and that his angina is typically always at rest - this is chronic. He has had prior echo showing mild dilatation of the aortawith repeat studies was not identified.   I last saw him back in May - doing ok. Some fleeting chest pain but overall was felt to be stable from our standpoint.   Phone call this morning - noting several episodes of chest pain since last week. Thus added to my schedule for today.   Comes in today. Here with his wife Hunter Mcbride.He has had chest burning - in his back to the shoulder and the chest and down the left arm - has been off and on since Thursday - more so "on". Identical to his prior chest pain syndrome. [name] made him use NTG on Saturday - this brought short term relief. He did not wish to go and sit in the ER for the weekend - so he did not call. He was still able to go hunting - killed a buck on Friday but did not pull it out. He has been using "pain medicine and nerve medicine to sleep" - otherwise, he could not sleep. He wondered if this was indigestion - but notes it  is identical to his prior chest pain syndrome. No other associated symptoms - not worse with deep breathing, sitting up etc.   Past Medical History:  Diagnosis Date  . Arthritis    "lower back; right shoulder; left thumb; joints" (06/21/2013)  . Asthma    "not sure if this is true or not" (06/21/2013)  . CAD (coronary artery disease) May 2015   s/p PTCA/DES x 3 to mid LAD, mod non-ob disease in Cx and RCA May 2015; s/p repeat caths - last study in November of 2015 - stents patent, - FFR - managed medically.   [name] GERD (gastroesophageal reflux disease)   . Gout    "maybe twice in my life"  . Headache(784.0)    "weekly for the last 3-4 months" (06/21/2013)  . Hyperlipidemia   . Hypertension   . Myocardial infarction [facility])    "/Dr. Katharina Caper I've had one between 2014-2015) (06/21/2013)  . Osteoarthritis   . Sleep apnea    WEIGHT LOSS, NO LONGER NEEDS PER PATIENT  . Type II diabetes mellitus ([place])    TYPE 2    Past Surgical History:  Procedure Laterality Date  . CARDIAC CATHETERIZATION  1980's   "once"  . CARDIAC CATHETERIZATION N/A 07/23/2015  Procedure: Left Heart Cath and Coronary Angiography;  Surgeon: Sherren Mocha, MD;  Location: [place] [facility];  Service: Cardiovascular;  Laterality: N/A;  . CARDIOVASCULAR STRESS TEST  06/10/2008   EF 68%  . CARPAL TUNNEL RELEASE Bilateral   . CORONARY ANGIOPLASTY WITH STENT PLACEMENT  06/21/2013   "3"  . CORONARY ARTERY BYPASS GRAFT N/A 11/07/2016   Procedure: CORONARY ARTERY BYPASS GRAFTING (CABG) x 5 (LIMA to DISTAL LAD, SVG to DIAGONAL, SVG to CIRCUMFLEX, and SVG SEQUENTIALLY to PLB and DISTAL PDA) with EVH of the RIGHT GREATER SAPHENOUS VEIN and LEFT INTERNAL MAMMARY ARTERY HARVEST;  Surgeon: Grace Isaac, MD;  Location: [place];  Service: Open Heart Surgery;  Laterality: N/A;  . CORONARY STENT INTERVENTION N/A 06/07/2016   Procedure: Coronary Stent Intervention;  Surgeon: Burnell Blanks, MD;  Location: [place] [facility];  Service:  Cardiovascular;  Laterality: N/A;  . EXCISION MORTON'S NEUROMA Left   . EYE SURGERY Left    "removed film over my eye"  . FRACTIONAL FLOW RESERVE WIRE N/A 06/26/2013   Procedure: FRACTIONAL FLOW RESERVE WIRE;  Surgeon: [name], MD;  Location: [place] [facility];  Service: Cardiovascular;  Laterality: N/A;  . HERNIA REPAIR Left   . INTRAVASCULAR PRESSURE WIRE/FFR STUDY N/A 10/28/2016   Procedure: INTRAVASCULAR PRESSURE WIRE/FFR STUDY;  Surgeon: Leonie Man, MD;  Location: [place] [facility];  Service: Cardiovascular;  Laterality: N/A;  . JOINT REPLACEMENT Left 03/2013   "thumb"  . LEFT HEART CATH AND CORONARY ANGIOGRAPHY N/A 06/07/2016   Procedure: Left Heart Cath and Coronary Angiography;  Surgeon: Burnell Blanks, MD;  Location: [place] [facility];  Service: Cardiovascular;  Laterality: N/A;  . LEFT HEART CATH AND CORONARY ANGIOGRAPHY N/A 10/28/2016   Procedure: LEFT HEART CATH AND CORONARY ANGIOGRAPHY;  Surgeon: Leonie Man, MD;  Location: [place] [facility];  Service: Cardiovascular;  Laterality: N/A;  . LEFT HEART CATHETERIZATION WITH CORONARY ANGIOGRAM N/A 06/21/2013   Procedure: LEFT HEART CATHETERIZATION WITH CORONARY ANGIOGRAM;  Surgeon: Burnell Blanks, MD;  Location[facility];  Service: Cardiovascular;  Laterality: N/A;  . LEFT HEART CATHETERIZATION WITH CORONARY ANGIOGRAM N/A 06/26/2013   Procedure: LEFT HEART CATHETERIZATION WITH CORONARY ANGIOGRAM;  Surgeon: [name], MD;  Location: [place] [facility];  Service: Cardiovascular;  Laterality: N/A;  . TEE WITHOUT CARDIOVERSION N/A 11/07/2016   Procedure: TRANSESOPHAGEAL ECHOCARDIOGRAM (TEE);  Surgeon: Grace Isaac, MD;  Location: [place];  Service: Open Heart Surgery;  Laterality: N/A;  . TONSILLECTOMY    . TOTAL HIP ARTHROPLASTY Right 04/12/2016   Procedure: RIGHT TOTAL HIP ARTHROPLASTY ANTERIOR APPROACH;  Surgeon: Paralee Cancel, MD;  Location: [facility];  Service: Orthopedics;  Laterality: Right;  requests 70  mins  . TOTAL SHOULDER ARTHROPLASTY  03/01/2012   Procedure: TOTAL SHOULDER ARTHROPLASTY;  Surgeon: Marin Shutter, MD;  Location: [place];  Service: Orthopedics;  Laterality: Right;  . UMBILICAL HERNIA REPAIR       Medications: Current Meds  Medication Sig  . acetaminophen (TYLENOL) 500 MG tablet Take 1,000 mg by mouth every 8 (eight) hours as needed (pain).   [name] ALPRAZolam (XANAX) 0.5 MG tablet Take 0.5 tablets (0.25 mg total) by mouth daily as needed for anxiety.  [name] amLODipine (NORVASC) 5 MG tablet TAKE ONE TABLET BY MOUTH TWICE DAILY   . aspirin 81 MG tablet Take 81 mg by mouth at bedtime.   [name] atenolol (TENORMIN) 50 MG tablet Take 1 tablet (50 mg total)  by mouth at bedtime.  [name] atorvastatin (LIPITOR) 80 MG tablet Take 80 mg by mouth daily at 6 PM.  . canagliflozin (INVOKANA) 300 MG TABS tablet Take 300 mg by mouth daily before breakfast.  . Cholecalciferol (VITAMIN D PO) Take 1 capsule by mouth daily.  . clopidogrel (PLAVIX) 75 MG tablet TAKE ONE TABLET BY MOUTH ONE TIME DAILY WITH BREAKFAST  . docusate sodium (COLACE) 100 MG capsule Take 100 mg by mouth daily as needed for mild constipation.  [name] esomeprazole (NEXIUM) 40 MG capsule Take 1 capsule (40 mg total) by mouth 2 (two) times daily before a meal.  . gabapentin (NEURONTIN) 300 MG capsule Take 300 mg by mouth at bedtime.   . hydrochlorothiazide (HYDRODIURIL) 25 MG tablet Take 25 mg by mouth daily.  . Insulin Degludec (TRESIBA [place]) Inject 12 Units into the skin every morning.   . irbesartan (AVAPRO) 150 MG tablet Take 1 tablet (150 mg total) daily by mouth. (Patient taking differently: Take 300 mg by mouth daily. )  . metFORMIN (GLUCOPHAGE-XR) 500 MG 24 hr tablet Take 500 mg by mouth 2 (two) times daily.  . Multiple Vitamin (MULTIVITAMIN) tablet Take 1 tablet by mouth daily.  . nitroGLYCERIN (NITROSTAT) 0.4 MG SL tablet Place 1 tablet (0.4 mg total) under the tongue every 5 (five) minutes as needed for chest pain.  . polyethylene glycol  (MIRALAX / GLYCOLAX) packet Take 17 g by mouth daily as needed for mild constipation.  . ranitidine (ZANTAC) 150 MG tablet Take 1 tablet (150 mg total) by mouth 2 (two) times daily.  . traMADol (ULTRAM) 50 MG tablet Take 1 tablet (50 mg total) by mouth every 6 (six) hours as needed for moderate pain.   Current Facility-Administered Medications for the 12/25/17 encounter (Office Visit) with Burtis Junes, NP  Medication  . 0.9 %  sodium chloride infusion     Allergies: Allergies  Allergen Reactions  . Oxycodone Nausea Only    Social History: The patient  reports that he has never smoked. He has never used smokeless tobacco. He reports that he does not drink alcohol or use drugs.   Family History: The patient's family history includes Coronary artery disease in his cousin; Diabetes type II in his mother; Heart attack in his father; Heart disease in his brother.   Review of Systems: Please see the history of present illness.   Otherwise, the review of systems is positive for none.   All other systems are reviewed and negative.   Physical Exam: VS:  BP 132/80 (BP Location: Left Arm, Patient Position: Sitting, Cuff Size: Normal)   Pulse (!) 57   Ht 5\' 9"  (1.753 m)   Wt 193 lb 6.4 oz (87.7 kg)   BMI 28.56 kg/m  .  BMI Body mass index is 28.56 kg/m.  Wt Readings from Last 3 Encounters:  12/25/17 193 lb 6.4 oz (87.7 kg)  10/12/17 191 lb 6 oz (86.8 kg)  09/14/17 193 lb (87.5 kg)    General: He is alert and in no acute distress.  He complains of active chest pain during this visit - worsened during the course of the visit.  HEENT: Normal.  Neck: Supple, no JVD, carotid bruits, or masses noted.  Cardiac: Regular rate and rhythm. No murmurs, rubs, or gallops. No edema.  Respiratory:  Lungs are clear to auscultation bilaterally with normal work of breathing.  GI: Soft and nontender.  MS: No deformity or atrophy. Gait and ROM intact.  Skin: Warm and  dry. Color is normal.  Neuro:   Strength and sensation are intact and no gross focal deficits noted.  Psych: Alert, appropriate and with normal affect.   LABORATORY DATA:  EKG:  EKG is ordered today. This demonstrates NSR with inferior Q's but with ? ST elevation inferolaterally - reviewed with Dr. Acie Fredrickson (DOD).  Lab Results  Component Value Date   WBC 4.9 08/28/2017   HGB 13.1 08/28/2017   HCT 39.5 08/28/2017   PLT 156.0 08/28/2017   GLUCOSE 253 (H) 08/28/2017   CHOL 84 10/28/2016   TRIG 31 10/28/2016   HDL 36 (L) 10/28/2016   LDLCALC 42 10/28/2016   ALT 37 08/28/2017   AST 19 08/28/2017   NA 141 08/28/2017   K 5.0 08/28/2017   CL 103 08/28/2017   CREATININE 1.22 08/28/2017   BUN 24 (H) 08/28/2017   CO2 30 08/28/2017   TSH 2.009 06/06/2016   INR 1.51 11/07/2016   HGBA1C 7.5 (H) 11/03/2016     BNP (last 3 results) No results for input(s): BNP in the last 8760 hours.  ProBNP (last 3 results) No results for input(s): PROBNP in the last 8760 hours.   Other Studies Reviewed Today:  SURGICAL PROCEDURE9/2018:  Coronary artery bypass grafting x5 with the left internal mammary to the distal left anterior descending coronary artery, reverse saphenous vein graft to the diagonal, reverse saphenous vein graft to the first obtuse marginal, sequential reverse saphenous vein graft to the proximal posterior lateral branch and to the distal posterior descending with right leg, thigh, and calf greater saphenous vein harvesting endoscopically.  Assessment/Plan:  1. CAD with history of chronic angina - prior DES x 3 to the LAD, he has had PCI with DES x 1 to the proximal PLA and balloon PCI to the proximal PDA -now s/p CABG x 5 -with LIMA to distal left anterior descending coronary artery, SVG to the diagonal, SVG graft to the first obtuse marginal, sequential SVG to the proximal posterior lateral branch and to the distal posterior descending  - now with recurrent angina - with possible EKG changes - reviewed  with Dr. Acie Fredrickson who has subsequently seen the patient as well with me today - transferring by EMS to the [facility] ER - possible cath today - needs iStat troponin. 4 baby aspirin given along with 1 sl NTG here with mild improvement.   Will hold Metformin, HCTZ and Invokana with upcoming cath planned.   2. HTN -BP fair.    3. HLD - on statintherapy - recent labs noted from his physical.   Current medicines are reviewed with the patient today.  The patient does not have concerns regarding medicines other than what has been noted above.  The following changes have been made:  See above.  Labs/ tests ordered today include:    Orders Placed This Encounter  Procedures  . EKG 12-Lead     Disposition:   Further disposition pending. Transferred by EMS.    Patient is agreeable to this plan and will call if any problems develop in the interim.   SignedTruitt Merle, NP  12/25/2017 11:18 AM  [facility] [address] [place], [place]  [address] Phone: [phone] Fax: [phone]

## 2017-12-26 ENCOUNTER — Encounter (HOSPITAL_COMMUNITY): Payer: Self-pay | Admitting: Cardiovascular Disease

## 2018-01-02 ENCOUNTER — Ambulatory Visit (INDEPENDENT_AMBULATORY_CARE_PROVIDER_SITE_OTHER): Payer: PPO | Admitting: Nurse Practitioner

## 2018-01-02 ENCOUNTER — Encounter: Payer: Self-pay | Admitting: Nurse Practitioner

## 2018-01-02 VITALS — BP 118/70 | HR 62 | Ht 69.0 in | Wt 193.8 lb

## 2018-01-02 DIAGNOSIS — Z9889 Other specified postprocedural states: Secondary | ICD-10-CM | POA: Diagnosis not present

## 2018-01-02 DIAGNOSIS — I259 Chronic ischemic heart disease, unspecified: Secondary | ICD-10-CM

## 2018-01-02 LAB — BASIC METABOLIC PANEL
BUN/Creatinine Ratio: 25 — ABNORMAL HIGH (ref 10–24)
BUN: 28 mg/dL — ABNORMAL HIGH (ref 8–27)
CO2: 22 mmol/L (ref 20–29)
Calcium: 9.9 mg/dL (ref 8.6–10.2)
Chloride: 101 mmol/L (ref 96–106)
Creatinine, Ser: 1.1 mg/dL (ref 0.76–1.27)
GFR calc Af Amer: 78 mL/min/{1.73_m2} (ref >59)
GFR calc non Af Amer: 67 mL/min/{1.73_m2} (ref >59)
Glucose: 163 mg/dL — ABNORMAL HIGH (ref 65–99)
Potassium: 4.1 mmol/L (ref 3.5–5.2)
Sodium: 141 mmol/L (ref 134–144)

## 2018-01-02 NOTE — Progress Notes (Signed)
CARDIOLOGY OFFICE NOTE  Date:  01/02/2018    Hunter Mcbride. Date of Birth: [birth year] Medical Record #[id number]  PCP:  Crist Infante, MD  Cardiologist:  Servando Snare & Cooper/Nahser    Chief Complaint  Patient presents with  . Coronary Artery Disease    Post cath visit - seen for Dr. Maryruth Eve    History of Present Illness: Hunter Mcbride. is a 72 y.o. male who presents today for a post hospital visit. Seen for Dr. Burt Knack & Nahser. Primarily sees me.   He has a [place] standing history ofCAD, HTN, HLD, DM and OSA. He has had prior DES x 3 to the LAD back in 2015. Several caths since. Ended up being referred for CABG x 5 in September of 2018 by [facility]. He is Intolerant to higher doses of nitrates despite multiple attempts. Hadnot wanted to increase Ranexa due to cost. Has had chronic tendency to really overexert himself. It wassurprising how very well his exercise tolerance is and that his angina is typically always at rest - this is chronic. He has had prior echo showing mild dilatation of the aortawith repeat studieswasnot identified.   I last saw him back in May - doing ok. Some fleeting chest pain but overall was felt to be stable from our standpoint.   Seen as urgent work in last week with chest pain - ?EKG changes - referred for admission and was recathed - see below - to continue with medical management. Symptoms resolved after his cath.   Comes in today. Here with his wife Hunter Mcbride. He feels "great" - "like a million bucks". No more chest pain. No problems. No med changes. No problems with his cath site.   Past Medical History:  Diagnosis Date  . Arthritis    "lower back; right shoulder; left thumb; joints" (06/21/2013)  . Asthma    "not sure if this is true or not" (06/21/2013)  . CAD (coronary artery disease) 06/2013   s/p PTCA/DES x 3 to mid LAD 2015. b. s/p CABG in 2018. c. cath 12/2017 -> med rx.  [name] GERD (gastroesophageal reflux disease)   . Gout    "maybe twice in my life"  . Headache(784.0)    "weekly for the last 3-4 months" (06/21/2013)  . Hyperlipidemia   . Hypertension   . Myocardial infarction [facility])    "/Dr. Katharina Caper I've had one between 2014-2015) (06/21/2013)  . Osteoarthritis   . Sleep apnea    WEIGHT LOSS, NO LONGER NEEDS PER PATIENT  . Type II diabetes mellitus ([place])    TYPE 2    Past Surgical History:  Procedure Laterality Date  . CARDIAC CATHETERIZATION  1980's   "once"  . CARDIAC CATHETERIZATION N/A 07/23/2015   Procedure: Left Heart Cath and Coronary Angiography;  Surgeon: Sherren Mocha, MD;  Location: [place] [facility];  Service: Cardiovascular;  Laterality: N/A;  . CARDIOVASCULAR STRESS TEST  06/10/2008   EF 68%  . CARPAL TUNNEL RELEASE Bilateral   . CORONARY ANGIOPLASTY WITH STENT PLACEMENT  06/21/2013   "3"  . CORONARY ARTERY BYPASS GRAFT N/A 11/07/2016   Procedure: CORONARY ARTERY BYPASS GRAFTING (CABG) x 5 (LIMA to DISTAL LAD, SVG to DIAGONAL, SVG to CIRCUMFLEX, and SVG SEQUENTIALLY to PLB and DISTAL PDA) with EVH of the RIGHT GREATER SAPHENOUS VEIN and LEFT INTERNAL MAMMARY ARTERY HARVEST;  Surgeon: Grace Isaac, MD;  Location: [place];  Service: Open Heart Surgery;  Laterality: N/A;  . CORONARY STENT INTERVENTION  N/A 06/07/2016   Procedure: Coronary Stent Intervention;  Surgeon: Burnell Blanks, MD;  Location: [place] [facility];  Service: Cardiovascular;  Laterality: N/A;  . EXCISION MORTON'S NEUROMA Left   . EYE SURGERY Left    "removed film over my eye"  . FRACTIONAL FLOW RESERVE WIRE N/A 06/26/2013   Procedure: FRACTIONAL FLOW RESERVE WIRE;  Surgeon: [name], MD;  Location: [place] CATH LAB;  Service: Cardiovascular;  Laterality: N/A;  . HERNIA REPAIR Left   . INTRAVASCULAR PRESSURE WIRE/FFR STUDY N/A 10/28/2016   Procedure: INTRAVASCULAR PRESSURE WIRE/FFR STUDY;  Surgeon: Leonie Man, MD;  Location: [place] [facility];  Service: Cardiovascular;  Laterality: N/A;  . JOINT REPLACEMENT Left  03/2013   "thumb"  . LEFT HEART CATH AND CORONARY ANGIOGRAPHY N/A 06/07/2016   Procedure: Left Heart Cath and Coronary Angiography;  Surgeon: Burnell Blanks, MD;  Location: [place] [facility];  Service: Cardiovascular;  Laterality: N/A;  . LEFT HEART CATH AND CORONARY ANGIOGRAPHY N/A 10/28/2016   Procedure: LEFT HEART CATH AND CORONARY ANGIOGRAPHY;  Surgeon: Leonie Man, MD;  Location: [place] [facility];  Service: Cardiovascular;  Laterality: N/A;  . LEFT HEART CATH AND CORS/GRAFTS ANGIOGRAPHY N/A 12/25/2017   Procedure: LEFT HEART CATH AND CORS/GRAFTS ANGIOGRAPHY;  Surgeon: Sherren Mocha, MD;  Location: [place] [facility];  Service: Cardiovascular;  Laterality: N/A;  . LEFT HEART CATHETERIZATION WITH CORONARY ANGIOGRAM N/A 06/21/2013   Procedure: LEFT HEART CATHETERIZATION WITH CORONARY ANGIOGRAM;  Surgeon: Burnell Blanks, MD;  Location: [facility];  Service: Cardiovascular;  Laterality: N/A;  . LEFT HEART CATHETERIZATION WITH CORONARY ANGIOGRAM N/A 06/26/2013   Procedure: LEFT HEART CATHETERIZATION WITH CORONARY ANGIOGRAM;  Surgeon: [name], MD;  Location: [place] CATH LAB;  Service: Cardiovascular;  Laterality: N/A;  . TEE WITHOUT CARDIOVERSION N/A 11/07/2016   Procedure: TRANSESOPHAGEAL ECHOCARDIOGRAM (TEE);  Surgeon: Grace Isaac, MD;  Location: [place];  Service: Open Heart Surgery;  Laterality: N/A;  . TONSILLECTOMY    . TOTAL HIP ARTHROPLASTY Right 04/12/2016   Procedure: RIGHT TOTAL HIP ARTHROPLASTY ANTERIOR APPROACH;  Surgeon: Paralee Cancel, MD;  Location: [facility];  Service: Orthopedics;  Laterality: Right;  requests 70 mins  . TOTAL SHOULDER ARTHROPLASTY  03/01/2012   Procedure: TOTAL SHOULDER ARTHROPLASTY;  Surgeon: Marin Shutter, MD;  Location: [place];  Service: Orthopedics;  Laterality: Right;  . UMBILICAL HERNIA REPAIR       Medications: Current Meds  Medication Sig  . acetaminophen (TYLENOL) 500 MG tablet Take 1,000 mg by mouth every 8 (eight) hours as  needed (pain).   [name] ALPRAZolam (XANAX) 0.5 MG tablet Take 0.5 tablets (0.25 mg total) by mouth daily as needed for anxiety.  [name] amLODipine (NORVASC) 5 MG tablet TAKE ONE TABLET BY MOUTH TWICE DAILY   . aspirin 81 MG tablet Take 81 mg by mouth at bedtime.   [name] atenolol (TENORMIN) 50 MG tablet Take 1 tablet (50 mg total) by mouth at bedtime.  [name] atorvastatin (LIPITOR) 80 MG tablet Take 80 mg by mouth daily at 6 PM.  . canagliflozin (INVOKANA) 300 MG TABS tablet Take 300 mg by mouth daily before breakfast.  . Cholecalciferol (VITAMIN D PO) Take 1 capsule by mouth daily.  . clopidogrel (PLAVIX) 75 MG tablet TAKE ONE TABLET BY MOUTH ONE TIME DAILY WITH BREAKFAST (Patient taking differently: Take 75 mg by mouth daily. )  . docusate sodium (COLACE) 100 MG capsule Take 100 mg by mouth daily as needed  for mild constipation.  . gabapentin (NEURONTIN) 300 MG capsule Take 300 mg by mouth at bedtime.   . hydrochlorothiazide (HYDRODIURIL) 25 MG tablet Take 25 mg by mouth daily.  . Insulin Degludec (TRESIBA [place]) Inject 12 Units into the skin every morning.   . irbesartan (AVAPRO) 300 MG tablet Take 300 mg by mouth daily.  . metFORMIN (GLUCOPHAGE-XR) 500 MG 24 hr tablet Take 500 mg by mouth 2 (two) times daily.  . Multiple Vitamin (MULTIVITAMIN) tablet Take 1 tablet by mouth daily.  . nitroGLYCERIN (NITROSTAT) 0.4 MG SL tablet Place 1 tablet (0.4 mg total) under the tongue every 5 (five) minutes as needed for chest pain.  . pantoprazole (PROTONIX) 40 MG tablet Take 1 tablet (40 mg total) by mouth 2 (two) times daily before a meal.  . polyethylene glycol (MIRALAX / GLYCOLAX) packet Take 17 g by mouth daily as needed for mild constipation.  . ranitidine (ZANTAC) 150 MG tablet Take 1 tablet (150 mg total) by mouth 2 (two) times daily.  . traMADol (ULTRAM) 50 MG tablet Take 1 tablet (50 mg total) by mouth every 6 (six) hours as needed for moderate pain.   Current Facility-Administered Medications for the 01/02/18  encounter (Office Visit) with Burtis Junes, NP  Medication  . 0.9 %  sodium chloride infusion     Allergies: Allergies  Allergen Reactions  . Oxycodone Nausea Only    Social History: The patient  reports that he has never smoked. He has never used smokeless tobacco. He reports that he does not drink alcohol or use drugs.   Family History: The patient's family history includes Coronary artery disease in his cousin; Diabetes type II in his mother; Heart attack in his father; Heart disease in his brother.   Review of Systems: Please see the history of present illness.   Otherwise, the review of systems is positive for none.   All other systems are reviewed and negative.   Physical Exam: VS:  BP 118/70 (BP Location: Left Arm, Patient Position: Sitting, Cuff Size: Normal)   Pulse 62   Ht 5\' 9"  (1.753 m)   Wt 193 lb 12.8 oz (87.9 kg)   SpO2 99% Comment: at rest  BMI 28.62 kg/m  .  BMI Body mass index is 28.62 kg/m.  Wt Readings from Last 3 Encounters:  01/02/18 193 lb 12.8 oz (87.9 kg)  12/25/17 193 lb 6.4 oz (87.7 kg)  10/12/17 191 lb 6 oz (86.8 kg)    General: Alert and in no acute distress.   HEENT: Normal.  Neck: Supple, no JVD, carotid bruits, or masses noted.  Cardiac: Regular rate and rhythm. No murmurs, rubs, or gallops. No edema.  Respiratory:  Lungs are clear to auscultation bilaterally with normal work of breathing.  GI: Soft and nontender.  MS: No deformity or atrophy. Gait and ROM intact.  Skin: Warm and dry. Color is normal.  Neuro:  Strength and sensation are intact and no gross focal deficits noted.  Psych: Alert, appropriate and with normal affect.   LABORATORY DATA:  EKG:  EKG is not ordered today.  Lab Results  Component Value Date   WBC 4.1 12/25/2017   HGB 13.0 12/25/2017   HCT 39.4 12/25/2017   PLT 128 (L) 12/25/2017   GLUCOSE 96 12/25/2017   CHOL 90 12/25/2017   TRIG 89 12/25/2017   HDL 36 (L) 12/25/2017   LDLCALC 36 12/25/2017    ALT 38 12/25/2017   AST 29 12/25/2017   NA  138 12/25/2017   K 3.3 (L) 12/25/2017   CL 104 12/25/2017   CREATININE 0.98 12/25/2017   BUN 23 12/25/2017   CO2 29 12/25/2017   TSH 2.009 06/06/2016   INR 1.23 12/25/2017   HGBA1C 7.5 (H) 12/25/2017     BNP (last 3 results) Recent Labs    12/25/17 1553  BNP 32.2    ProBNP (last 3 results) No results for input(s): PROBNP in the last 8760 hours.   Other Studies Reviewed Today: LEFT HEART CATH AND CORS/GRAFTS ANGIOGRAPHY 12/2017  Conclusion     Ost LM lesion is 40% stenosed.  Dist LAD lesion is 40% stenosed.  1st Diag lesion is 80% stenosed.  2nd Diag lesion is 70% stenosed.  Prox LAD to Dist LAD lesion is 50% stenosed.  Mid Cx lesion is 60% stenosed.  1st Mrg lesion is 75% stenosed.  Non-stenotic Dist RCA lesion was previously treated.  Prox RCA to Mid RCA lesion is 35% stenosed.  Mid RCA lesion is 40% stenosed.  Ost RPDA to RPDA lesion is 40% stenosed.  RPDA lesion is 85% stenosed.  SVG graft was visualized by angiography and is normal in caliber.  The graft exhibits no disease.  LIMA.  SVG.  Seq SVG-.   1.  Patent left mainstem 2.  Patent LAD with mild diffuse stenosis, severe diagonal stenosis with a patent graft 3.  Severe first obtuse marginal stenosis with a patent graft  4.  Patent RCA with mild nonobstructive disease and severe PDA stenosis with patent graft to the PDA and PLA branches 5.  Atretic LIMA to LAD, LAD supplied via antegrade native vessel flow as well as retrograde flow from the diagonal branch saphenous vein graft. 6.  Normal LVEDP  Recommend ongoing medical therapy.  If the patient is chest pain-free this evening, hospital discharge would be reasonable.  If recurrent chest pain would observe overnight with serial cardiac markers.      SURGICAL PROCEDURE9/2018:  Coronary artery bypass grafting x5 with the left internal mammary to the distal left anterior descending  coronary artery, reverse saphenous vein graft to the diagonal, reverse saphenous vein graft to the first obtuse marginal, sequential reverse saphenous vein graft to the proximal posterior lateral branch and to the distal posterior descending with right leg, thigh, and calf greater saphenous vein harvesting endoscopically.  Assessment/Plan:  1. CAD - with prior DES x 3 to the LAD, prior DES x 1 to proximal PLA and balloon PCI to proximal PDA - subsequent X CABG x 5 with LIMA to distal LAD, SVG to DX, SVG to 1st OM & sequential SVG to proximal posterior lateral and distal PD.  Most recent cath for chest pain with stable findings - he is to continue with his current medical management and continued CV risk factor modification.  2. HTN - BP is fine - no changes made   3. HLD - on statin therapy  4. DM - per PCP  5. Hypokalemia - recheck lab today.    Current medicines are reviewed with the patient today.  The patient does not have concerns regarding medicines other than what has been noted above.  The following changes have been made:  See above.  Labs/ tests ordered today include:    Orders Placed This Encounter  Procedures  . Basic metabolic panel     Disposition:   FU with me in 6 months.    Patient is agreeable to this plan and will call if any problems develop in  the interim.   SignedTruitt Merle, NP  01/02/2018 11:25 AM  [facility] [address] [place], [place]  [address] Phone: [phone] Fax: [phone]

## 2018-01-02 NOTE — Patient Instructions (Signed)
We will be checking the following labs today - BMET  If you have labs (blood work) drawn today and your tests are completely normal, you will receive your results only by: [name] MyChart Message (if you have MyChart) OR . A paper copy in the mail If you have any lab test that is abnormal or we need to change your treatment, we will call you to review the results.   Medication Instructions:    Continue with your current medicines.    If you need a refill on your cardiac medications before your next appointment, please call your pharmacy.     Testing/Procedures To Be Arranged:  N/A  Follow-Up:   See me in 6 months    At [facility], you and your health needs are our priority.  As part of our continuing mission to provide you with exceptional heart care, we have created designated Provider Care Teams.  These Care Teams include your primary Cardiologist (physician) and Advanced Practice Providers (APPs -  Physician Assistants and Nurse Practitioners) who all work together to provide you with the care you need, when you need it.  Special Instructions:  . None  Call the [place] office at [phone] if you have any questions, problems or concerns.

## 2018-01-17 DIAGNOSIS — Z6828 Body mass index (BMI) 28.0-28.9, adult: Secondary | ICD-10-CM | POA: Diagnosis not present

## 2018-01-17 DIAGNOSIS — E7849 Other hyperlipidemia: Secondary | ICD-10-CM | POA: Diagnosis not present

## 2018-01-17 DIAGNOSIS — I10 Essential (primary) hypertension: Secondary | ICD-10-CM | POA: Diagnosis not present

## 2018-01-17 DIAGNOSIS — E1169 Type 2 diabetes mellitus with other specified complication: Secondary | ICD-10-CM | POA: Diagnosis not present

## 2018-01-17 DIAGNOSIS — I2510 Atherosclerotic heart disease of native coronary artery without angina pectoris: Secondary | ICD-10-CM | POA: Diagnosis not present

## 2018-01-17 DIAGNOSIS — J45998 Other asthma: Secondary | ICD-10-CM | POA: Diagnosis not present

## 2018-01-23 ENCOUNTER — Ambulatory Visit: Payer: PPO | Admitting: Nurse Practitioner

## 2018-01-31 ENCOUNTER — Ambulatory Visit: Payer: PPO | Admitting: Nurse Practitioner

## 2018-02-20 IMAGING — DX DG CHEST 2V
2 series · 2 of 2 positions shown · non-contrast
Comparison: 07/08/2016

CLINICAL DATA: chest pain. Pt has had cp since yesterday. He said
it happens off and on a lot but, has been bad since yesterday. He
feels pain in his left side chest from left nipple to center of
chest. He said it will also hurt straight through into his back as
well. Pt mentioned usually he'll feel the pain start in his back
first, then into his chest. Pt explained he has experienced a lot of
trouble with his heart in the past. Hx of CAD, HTN, MI, DM2, pt has
had heart numerous heart catheterizations and stents

EXAM:
CHEST  2 VIEW

[chest pa]
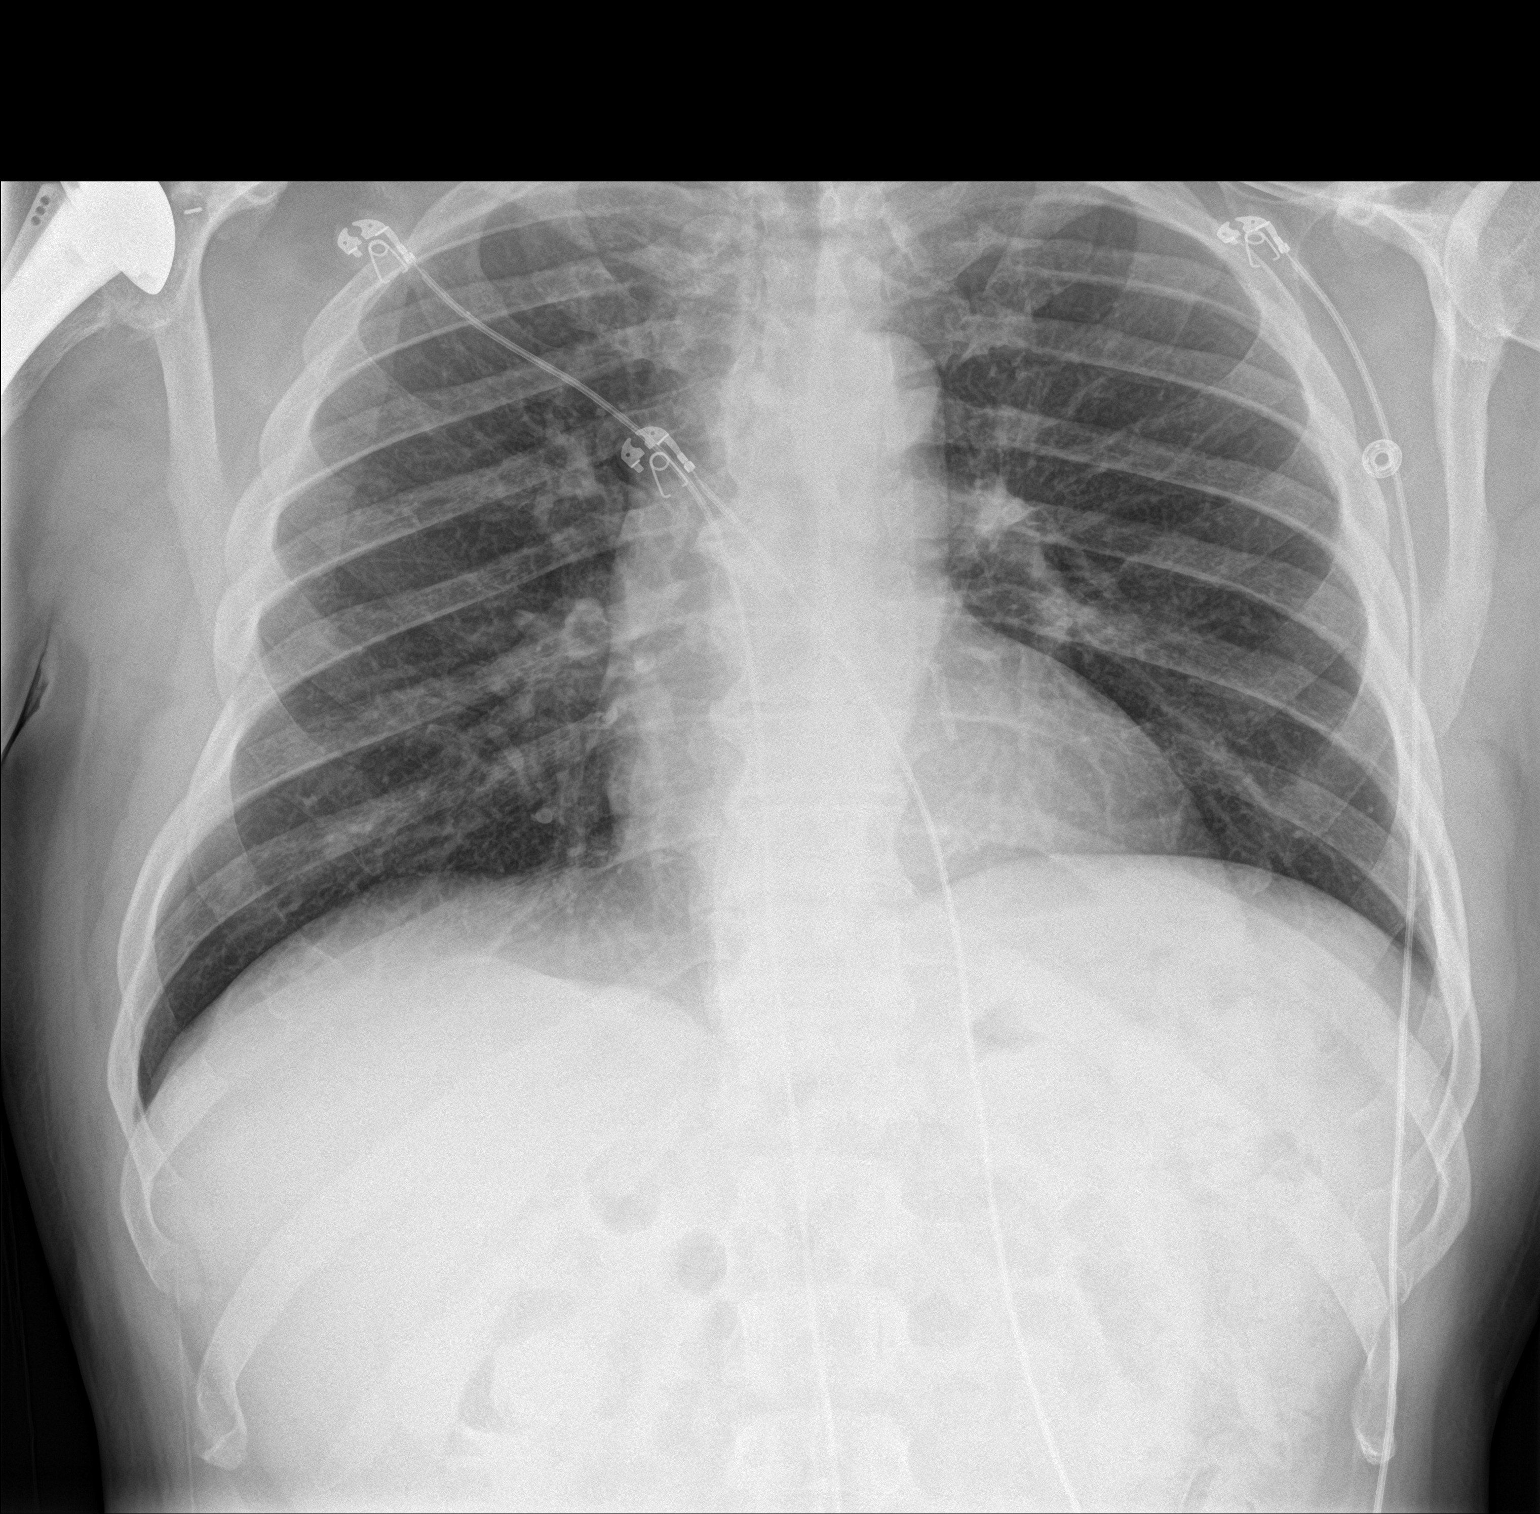

[chest lat]
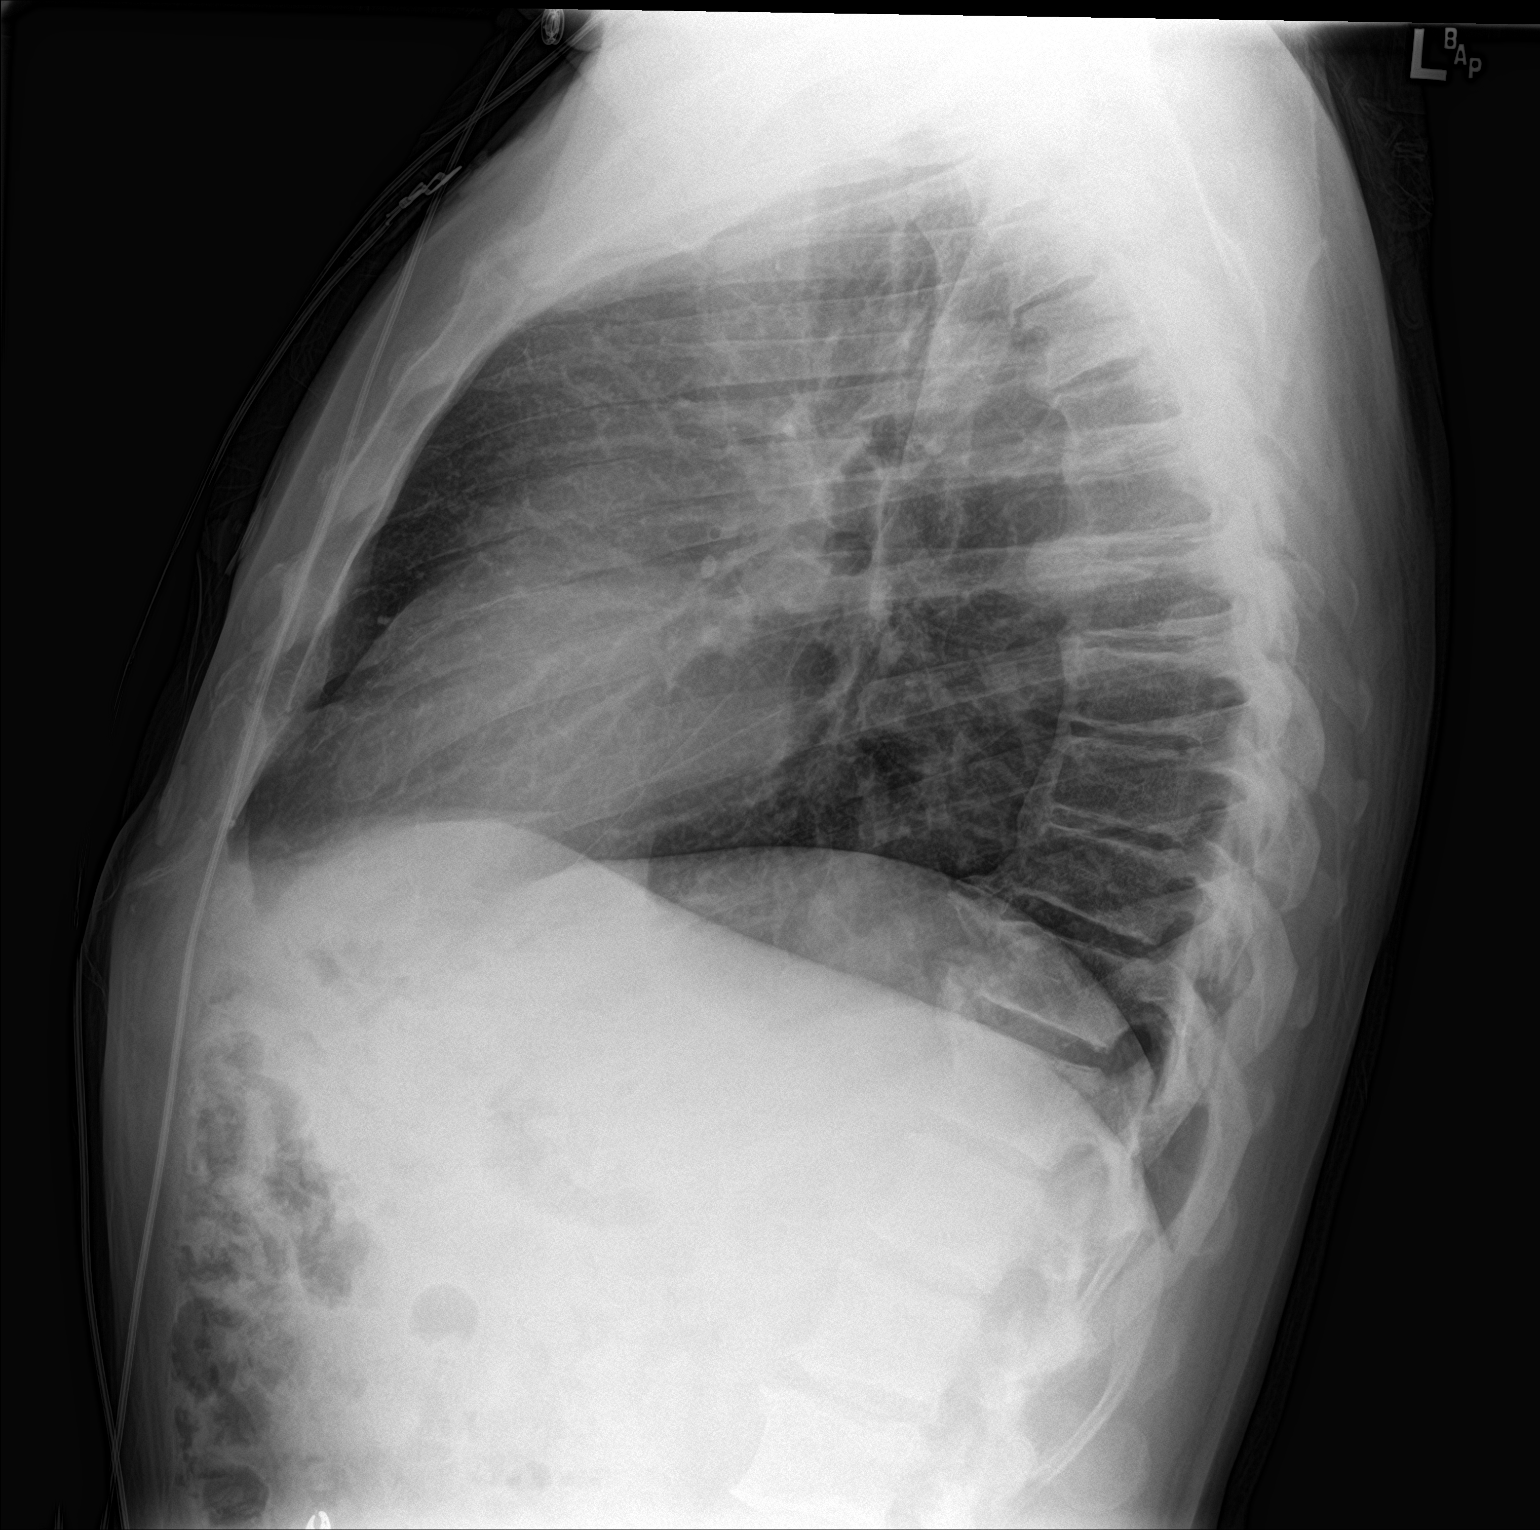

[2 of 2 positions shown; findings below may reference images not displayed]

FINDINGS: Cardiac silhouette is normal in size and configuration. No
mediastinal or hilar masses. No evidence of adenopathy.

Clear lungs.

No pleural effusion or pneumothorax.

Right shoulder prosthesis, partly imaged appears well-seated and
aligned.
IMPRESSION: 1. No active cardiopulmonary disease.

## 2018-02-27 DIAGNOSIS — M5136 Other intervertebral disc degeneration, lumbar region: Secondary | ICD-10-CM | POA: Diagnosis not present

## 2018-02-27 DIAGNOSIS — M5416 Radiculopathy, lumbar region: Secondary | ICD-10-CM | POA: Diagnosis not present

## 2018-03-15 DIAGNOSIS — M5136 Other intervertebral disc degeneration, lumbar region: Secondary | ICD-10-CM | POA: Diagnosis not present

## 2018-03-15 DIAGNOSIS — M5416 Radiculopathy, lumbar region: Secondary | ICD-10-CM | POA: Diagnosis not present

## 2018-03-16 DIAGNOSIS — H2513 Age-related nuclear cataract, bilateral: Secondary | ICD-10-CM | POA: Diagnosis not present

## 2018-03-21 ENCOUNTER — Ambulatory Visit: Payer: PPO | Admitting: Podiatrist

## 2018-03-21 ENCOUNTER — Encounter: Payer: Self-pay | Admitting: Podiatrist

## 2018-03-21 ENCOUNTER — Ambulatory Visit: Payer: PPO

## 2018-03-21 DIAGNOSIS — M79672 Pain in left foot: Secondary | ICD-10-CM

## 2018-03-21 DIAGNOSIS — G5790 Unspecified mononeuropathy of unspecified lower limb: Secondary | ICD-10-CM | POA: Diagnosis not present

## 2018-03-21 DIAGNOSIS — B351 Tinea unguium: Secondary | ICD-10-CM | POA: Diagnosis not present

## 2018-03-21 DIAGNOSIS — S90222A Contusion of left lesser toe(s) with damage to nail, initial encounter: Secondary | ICD-10-CM | POA: Diagnosis not present

## 2018-03-21 DIAGNOSIS — M79676 Pain in unspecified toe(s): Secondary | ICD-10-CM

## 2018-03-21 NOTE — Progress Notes (Signed)
Chief Complaint  Patient presents with  . Foot Injury    Pt states hit left 1st toenail on a chair and injured it last night, pt states toenail is bleeding but doesn't believe toe is broken.  . Nail Problem    Nail fungus bilateral 1-5, pt states color change.     HPI: Patient is 73 y.o. male who presents today for a left great toenail which he knocked loose last night.  He did not notice when he knocked the toenail loose but he did notice bleeding this morning.  The patient is also diabetic and requests nail trim.  He also is concerned over the fungus on the right foot toenails.  Subjectively he states he has some numbness from the midfoot to the toes.  Review of Systems  DATA OBTAINED: from patient  GENERAL: Feels well no fevers, no fatigue, no changes in appetite SKIN: No itching, no rashes, no open wounds EYES: No eye pain,no redness, no discharge EARS: No earache,no ringing of ears, NOSE: No congestion, no drainage, no bleeding  MOUTH/THROAT: No mouth pain, No sore throat, No difficulty chewing or swallowing  RESPIRATORY: No cough, no wheezing, no SOB CARDIAC: No chest pain,no heart palpitations, GI: No abdominal pain, No Nausea, no vomiting, no diarrhea, no heartburn or no reflux  GU: No dysuria, no increased frequency or urgency MUSCULOSKELETAL: No unrelieved bone/joint pain,  NEUROLOGIC: Awake, alert, appropriate to situation, No change in mental status. PSYCHIATRIC: No overt anxiety or sadness.No behavior issue.      Physical Exam  GENERAL APPEARANCE: Alert, conversant. Appropriately groomed. No acute distress.  VASCULAR: Pedal pulses palpable at 2/4 DP and PT bilateral.  Capillary refill time is immediate to all digits,  Proximal to distal cooling it warm to warm.  Digital hair growth is present bilateral   NEUROLOGIC: sensation is decreased epicritically and protectively to 5.07 monofilament at 2 /5 sites bilateral.  Light touch is intact bilateral, vibratory sensation  intact bilateral, achilles tendon reflex is intact bilateral.  Neuropathy present midfoot distally  MUSCULOSKELETAL: acceptable muscle strength, tone and stability bilateral. Mild hallux limitus present bilateral-  Mild contracture hammertoes noted bilateral.  DERMATOLOGIC: Left hallux nail is loose and there is some scant blood beneath the nail.  No redness, no pus, no sign of infection noted.  The right foot has thickened, discolored, dystrophic fungal nails 1 through 5 right and the left second toenail also has fungal elements present.  Otherwise dermatological examination is normal  Patient Active Problem List   Diagnosis Date Noted  . Degeneration of lumbar intervertebral disc 07/19/2017  . Lumbar radiculopathy 07/19/2017  . Coronary artery disease 11/07/2016  . Angina pectoris ([place]) 10/29/2016  . GERD (gastroesophageal reflux disease) 10/28/2016  . Coronary artery disease due to calcified coronary lesion   . Hyperlipidemia LDL goal <70   . Overweight (BMI 25.0-29.9) 04/13/2016  . S/P right THA, AA 04/12/2016  . Angina at rest [facility]) 02/25/2015  . Type 2 diabetes mellitus treated without insulin ([place])   . Coronary atherosclerosis of native coronary artery 06/27/2013  . Unstable angina ([place]) 06/21/2013  . Chest pain 06/20/2013  . Shoulder arthritis 03/02/2012  . Hypertension 04/11/2011  . Hyperlipidemia 04/11/2011  . Diabetes mellitus ([place]) 04/11/2011  . Chest tightness 10/13/2010    Assessment   Contusion left great toenail diabetes with neuropathy Onychomycosis of toenails Diabetes with neuropathy  Plan 1.Recommended a nonpermanent nail avulsion on the left hallux and this was accomplished today without complication.  The toe  was anesthetized with lidocaine and Marcaine mixed in a Betadine prep was applied the nail was then removed in toto without complication area was then cleansed with Betadine and alcohol and a dressing was applied.  He was given instructions for soaking  and aftercare.  2.Patient's nails were debrided without complication at today's visit.  We will write a prescription for antifungal nail lacquer from [facility]  3.Discussed the positive benefits of order diabetic shoes with custom inserts we will get him set up for fitting.

## 2018-03-21 NOTE — Patient Instructions (Signed)
Soak Instructions    THE DAY AFTER THE PROCEDURE  Place 1/4 cup of epsom salts in a quart of warm tap water.  Submerge your foot or feet with outer bandage intact for the initial soak; this will allow the bandage to become moist and wet for easy lift off.  Once you remove your bandage, continue to soak in the solution for 20 minutes.  This soak should be done twice a day.  Next, remove your foot or feet from solution, blot dry the affected area and cover.  You may use a band aid large enough to cover the area or use gauze and tape.  Apply other medications to the area as directed by the doctor such as polysporin neosporin.  IF YOUR SKIN BECOMES IRRITATED WHILE USING THESE INSTRUCTIONS, IT IS OKAY TO SWITCH TO  WHITE VINEGAR AND WATER. Or you may use antibacterial soap and water to keep the toe clean    

## 2018-03-30 ENCOUNTER — Ambulatory Visit: Payer: PPO | Admitting: Orthotics

## 2018-03-30 DIAGNOSIS — G5790 Unspecified mononeuropathy of unspecified lower limb: Secondary | ICD-10-CM

## 2018-03-30 DIAGNOSIS — S90222A Contusion of left lesser toe(s) with damage to nail, initial encounter: Secondary | ICD-10-CM

## 2018-03-30 DIAGNOSIS — M79672 Pain in left foot: Secondary | ICD-10-CM

## 2018-04-02 NOTE — Progress Notes (Signed)

## 2018-04-05 DIAGNOSIS — M545 Low back pain: Secondary | ICD-10-CM | POA: Diagnosis not present

## 2018-04-05 DIAGNOSIS — M5416 Radiculopathy, lumbar region: Secondary | ICD-10-CM | POA: Diagnosis not present

## 2018-04-05 DIAGNOSIS — M5136 Other intervertebral disc degeneration, lumbar region: Secondary | ICD-10-CM | POA: Diagnosis not present

## 2018-04-12 DIAGNOSIS — M545 Low back pain: Secondary | ICD-10-CM | POA: Diagnosis not present

## 2018-04-18 DIAGNOSIS — M5416 Radiculopathy, lumbar region: Secondary | ICD-10-CM | POA: Diagnosis not present

## 2018-04-18 DIAGNOSIS — M5136 Other intervertebral disc degeneration, lumbar region: Secondary | ICD-10-CM | POA: Diagnosis not present

## 2018-04-27 ENCOUNTER — Other Ambulatory Visit: Payer: Self-pay

## 2018-04-27 ENCOUNTER — Ambulatory Visit: Payer: PPO | Admitting: Orthotics

## 2018-04-27 DIAGNOSIS — M2041 Other hammer toe(s) (acquired), right foot: Secondary | ICD-10-CM | POA: Diagnosis not present

## 2018-04-27 DIAGNOSIS — E1169 Type 2 diabetes mellitus with other specified complication: Secondary | ICD-10-CM | POA: Diagnosis not present

## 2018-04-27 DIAGNOSIS — J45998 Other asthma: Secondary | ICD-10-CM | POA: Diagnosis not present

## 2018-04-27 DIAGNOSIS — I10 Essential (primary) hypertension: Secondary | ICD-10-CM | POA: Diagnosis not present

## 2018-04-27 DIAGNOSIS — M5410 Radiculopathy, site unspecified: Secondary | ICD-10-CM | POA: Diagnosis not present

## 2018-04-27 DIAGNOSIS — Z6829 Body mass index (BMI) 29.0-29.9, adult: Secondary | ICD-10-CM | POA: Diagnosis not present

## 2018-04-27 DIAGNOSIS — I2510 Atherosclerotic heart disease of native coronary artery without angina pectoris: Secondary | ICD-10-CM | POA: Diagnosis not present

## 2018-04-27 DIAGNOSIS — M2042 Other hammer toe(s) (acquired), left foot: Secondary | ICD-10-CM | POA: Diagnosis not present

## 2018-04-27 DIAGNOSIS — L989 Disorder of the skin and subcutaneous tissue, unspecified: Secondary | ICD-10-CM | POA: Insufficient documentation

## 2018-04-27 DIAGNOSIS — E1140 Type 2 diabetes mellitus with diabetic neuropathy, unspecified: Secondary | ICD-10-CM | POA: Diagnosis not present

## 2018-04-30 DIAGNOSIS — M545 Low back pain: Secondary | ICD-10-CM | POA: Diagnosis not present

## 2018-04-30 DIAGNOSIS — M48061 Spinal stenosis, lumbar region without neurogenic claudication: Secondary | ICD-10-CM | POA: Diagnosis not present

## 2018-04-30 DIAGNOSIS — M5416 Radiculopathy, lumbar region: Secondary | ICD-10-CM | POA: Diagnosis not present

## 2018-04-30 DIAGNOSIS — M5136 Other intervertebral disc degeneration, lumbar region: Secondary | ICD-10-CM | POA: Diagnosis not present

## 2018-05-01 ENCOUNTER — Telehealth: Payer: Self-pay | Admitting: *Deleted

## 2018-05-01 ENCOUNTER — Other Ambulatory Visit: Payer: Self-pay | Admitting: Orthopedic Surgery

## 2018-05-01 DIAGNOSIS — M5416 Radiculopathy, lumbar region: Secondary | ICD-10-CM

## 2018-05-01 NOTE — Telephone Encounter (Signed)
   [facility] Pre-operative Risk Assessment    Request for surgical clearance:  1. What type of surgery is being performed? LUMBAR ESI   2. When is this surgery scheduled? TBD   3. What type of clearance is required (medical clearance vs. Pharmacy clearance to hold med vs. Both)? MEDICAL  4. Are there any medications that need to be held prior to surgery and how [place]?PLAVIX 5 DAYS PRIOR   5. Practice name and name of physician performing surgery? [facility]   6. What is your office phone number [phone]    7.   What is your office fax number [phone]  8.   Anesthesia type (None, local, MAC, general) ? LOCAL   Julaine Hua 05/01/2018, 12:39 PM  _________________________________________________________________   (provider comments below)

## 2018-05-02 NOTE — Telephone Encounter (Signed)
   Primary Cardiologist: Sherren Mocha, MD  Chart reviewed as part of pre-operative protocol coverage. Given past medical history and time since last visit, based on ACC/AHA guidelines, Glendal Cassaday. would be at acceptable risk for the planned procedure without further cardiovascular testing. He is walking 3-4 miles everyday without any issue.   Dr. Burt Knack, can patient hold Plavix for 5 days? Please forward your response to P CV DIV PREOP.   Thank you   Leanor Kail, PA 05/02/2018, 9:40 AM

## 2018-05-02 NOTE — Telephone Encounter (Signed)
Yes he is at low risk of holding plavix x 5 days. thanks

## 2018-05-03 DIAGNOSIS — L820 Inflamed seborrheic keratosis: Secondary | ICD-10-CM | POA: Diagnosis not present

## 2018-05-03 DIAGNOSIS — L821 Other seborrheic keratosis: Secondary | ICD-10-CM | POA: Diagnosis not present

## 2018-05-03 DIAGNOSIS — C44321 Squamous cell carcinoma of skin of nose: Secondary | ICD-10-CM | POA: Diagnosis not present

## 2018-05-03 DIAGNOSIS — D1801 Hemangioma of skin and subcutaneous tissue: Secondary | ICD-10-CM | POA: Diagnosis not present

## 2018-05-03 DIAGNOSIS — D0439 Carcinoma in situ of skin of other parts of face: Secondary | ICD-10-CM | POA: Diagnosis not present

## 2018-05-15 DIAGNOSIS — Z85828 Personal history of other malignant neoplasm of skin: Secondary | ICD-10-CM | POA: Diagnosis not present

## 2018-05-15 DIAGNOSIS — C44321 Squamous cell carcinoma of skin of nose: Secondary | ICD-10-CM | POA: Diagnosis not present

## 2018-05-22 DIAGNOSIS — M48061 Spinal stenosis, lumbar region without neurogenic claudication: Secondary | ICD-10-CM | POA: Diagnosis not present

## 2018-05-24 ENCOUNTER — Telehealth: Payer: Self-pay | Admitting: *Deleted

## 2018-05-24 ENCOUNTER — Ambulatory Visit: Payer: Self-pay | Admitting: Orthopedic Surgery

## 2018-05-24 NOTE — Telephone Encounter (Signed)
   [facility] Pre-operative Risk Assessment    Request for surgical clearance:  1. What type of surgery is being performed? L5-S1 FORAMINAL  DECOMPRESSION   2. When is this surgery scheduled? TBD   3. What type of clearance is required (medical clearance vs. Pharmacy clearance to hold med vs. Both)?MEDICAL   4. Are there any medications that need to be held prior to surgery and how Hunter Mcbride? NONE LISTED THOUGH PT IS ON PLAVIX    5. Practice name and name of physician performing surgery? [facility]; Hunter Mcbride   6. What is your office phone number [phone]    7.   What is your office fax number [phone]  8.   Anesthesia type (None, local, MAC, general) ? NONE LISTED; I HAVE TRIED TO REACH THE OFFICE X 3 TO CONFIRM ANESTHESIA AS WELL IF PLAVIX NEEDS TO BE HELD AND HOW Hunter Mcbride IF SO   Hunter Mcbride 05/24/2018, 1:02 PM  _________________________________________________________________   (provider comments below)

## 2018-05-24 NOTE — Pre-Procedure Instructions (Signed)
Hunter Garnet Jr.  05/24/2018      [facility] # [place], [place] [address] [place] [address] Phone: [phone] Fax: [phone]  [facility] [place], [place] - [address] [place] [address] Phone: [phone] Fax: [place], [place] - [address] [place] [address] Phone: [phone] Fax: [phone]    Your procedure is scheduled on Thurs., May 31, 2018 from 7:45AM-10:17AM  Report to [facility] Entrance "A" at 5:45AM  Call this number if you have problems the morning of surgery:  [phone]   Remember:  Do not eat or drink after midnight on April 15th    Take these medicines the morning of surgery with A SIP OF WATER: AmLODipine (NORVASC), Gabapentin (NEURONTIN), and Pantoprazole (PROTONIX)  If needed: Acetaminophen (TYLENOL) for mild pain, ALPRAZolam (XANAX),  HYDROcodone-acetaminophen ([place]) for moderate pain, and NitroGLYCERIN (NITROSTAT)  Follow your surgeon's instructions on when to stop Aspirin.  If no instructions were given by your surgeon then you will need to call the office to get those instructions.    Take last dose of Plavix on 05/25/18, per Dr. Burt Knack  As of today, stop taking all Other Aspirin Products, Vitamins, Fish oils, and Herbal medications. Also stop all NSAIDS i.e. Advil, Ibuprofen, Motrin, Aleve, Anaprox, Naproxen, BC, Goody Powders, and all Supplements.   . Do not take Canagliflozin [facility]) the day before surgery.  . Do not take Canagliflozin (INVOKANA) and MetFORMIN (GLUCOPHAGE-XR) the day of surgery.     . THE MORNING OF SURGERY, take _____9________ units of ____Insulin Glargine (TOUJEO SOLOSTAR) ______insulin.   How to Manage Your Diabetes Before and After Surgery  Why is it important to control my blood sugar before and after surgery? . Improving  blood sugar levels before and after surgery helps healing and can limit problems. . A way of improving blood sugar control is eating a healthy diet by: o  Eating less sugar and carbohydrates o  Increasing activity/exercise o  Talking with your doctor about reaching your blood sugar goals . High blood sugars (greater than 180 mg/dL) can raise your risk of infections and slow your recovery, so you will need to focus on controlling your diabetes during the weeks before surgery. . Make sure that the doctor who takes care of your diabetes knows about your planned surgery including the date and location.  How do I manage my blood sugar before surgery? . Check your blood sugar at least 4 times a day, starting 2 days before surgery, to make sure that the level is not too high or low. o Check your blood sugar the morning of your surgery when you wake up and every 2 hours until you get to the Short Stay unit. . If your blood sugar is less than 70 mg/dL, you will need to treat for low blood sugar: o Do not take insulin. o Treat a low blood sugar (less than 70 mg/dL) with  cup of clear juice (cranberry or apple), 4 glucose tablets, OR glucose gel. Recheck blood sugar in 15 minutes after treatment (to make sure it is greater than 70 mg/dL). If your blood sugar is not greater than 70 mg/dL on recheck, call [phone] o  for further instructions. . If your CBG is greater than 220 mg/dL, you may take  of your sliding scale (correction) dose of insulin.  . If you  are admitted to the hospital after surgery: o Your blood sugar will be checked by the staff and you will probably be given insulin after surgery (instead of oral diabetes medicines) to make sure you have good blood sugar levels. o The goal for blood sugar control after surgery is 80-180 mg/dL.  Reviewed and Endorsed by [facility] Patient Education Committee, August 2015      Do not wear jewelry.  Do not wear lotions, powders, colognes, or  deodorant.  Do not shave 48 hours prior to surgery.  Men may shave face.  Do not bring valuables to the hospital.  [facility] is not responsible for any belongings or valuables.  Contacts, dentures or bridgework may not be worn into surgery.  Leave your suitcase in the car.  After surgery it may be brought to your room.  For patients admitted to the hospital, discharge time will be determined by your treatment team.  Patients discharged the day of surgery will not be allowed to drive home.   Special instructions:  [place]- Preparing For Surgery  Before surgery, you can play an important role. Because skin is not sterile, your skin needs to be as free of germs as possible. You can reduce the number of germs on your skin by washing with CHG (chlorahexidine gluconate) Soap before surgery.  CHG is an antiseptic cleaner which kills germs and bonds with the skin to continue killing germs even after washing.    Oral Hygiene is also important to reduce your risk of infection.  Remember - BRUSH YOUR TEETH THE MORNING OF SURGERY WITH YOUR REGULAR TOOTHPASTE  Please do not use if you have an allergy to CHG or antibacterial soaps. If your skin becomes reddened/irritated stop using the CHG.  Do not shave (including legs and underarms) for at least 48 hours prior to first CHG shower. It is OK to shave your face.  Please follow these instructions carefully.   1. Shower the NIGHT BEFORE SURGERY and the MORNING OF SURGERY with CHG.   2. If you chose to wash your hair, wash your hair first as usual with your normal shampoo.  3. After you shampoo, rinse your hair and body thoroughly to remove the shampoo.  4. Use CHG as you would any other liquid soap. You can apply CHG directly to the skin and wash gently with a scrungie or a clean washcloth.   5. Apply the CHG Soap to your body ONLY FROM THE NECK DOWN.  Do not use on open wounds or open sores. Avoid contact with your eyes, ears, mouth and genitals  (private parts). Wash Face and genitals (private parts)  with your normal soap.  6. Wash thoroughly, paying special attention to the area where your surgery will be performed.  7. Thoroughly rinse your body with warm water from the neck down.  8. DO NOT shower/wash with your normal soap after using and rinsing off the CHG Soap.  9. Pat yourself dry with a CLEAN TOWEL.  10. Wear CLEAN PAJAMAS to bed the night before surgery, wear comfortable clothes the morning of surgery  11. Place CLEAN SHEETS on your bed the night of your first shower and DO NOT SLEEP WITH PETS.  Day of Surgery:  Do not apply any deodorants/lotions.  Please wear clean clothes to the hospital/surgery center.   Remember to brush your teeth WITH YOUR REGULAR TOOTHPASTE.  Please read over the following fact sheets that you were given. Pain Booklet, Coughing and Deep Breathing,  MRSA Information and Surgical Site Infection Prevention

## 2018-05-24 NOTE — H&P (Signed)
Subjective:   Patient is a 73 y.o. male presented with a history of severe debilitating leg pain .  Onset of symptoms was approximately 5.5 months ago with unchanged pain since that time despite narcotic medications and epidural steroid injections. He is being admitted for surgical management of this condition. The indications for the procedure include progressive neurological deficits with L5 weakness as well as loss in sensation. Failure to improve with conservative measures. Ongoing debilitating neuropathic pain, spinal stenosis of lumbar region.  Patient Active Problem List   Diagnosis Date Noted  . Degeneration of lumbar intervertebral disc 07/19/2017  . Lumbar radiculopathy 07/19/2017  . Coronary artery disease 11/07/2016  . Angina pectoris ([place]) 10/29/2016  . GERD (gastroesophageal reflux disease) 10/28/2016  . Coronary artery disease due to calcified coronary lesion   . Hyperlipidemia LDL goal <70   . Overweight (BMI 25.0-29.9) 04/13/2016  . S/P right THA, AA 04/12/2016  . Angina at rest [facility]) 02/25/2015  . Type 2 diabetes mellitus treated without insulin ([place])   . Coronary atherosclerosis of native coronary artery 06/27/2013  . Unstable angina ([place]) 06/21/2013  . Chest pain 06/20/2013  . Shoulder arthritis 03/02/2012  . Hypertension 04/11/2011  . Hyperlipidemia 04/11/2011  . Diabetes mellitus ([place]) 04/11/2011  . Chest tightness 10/13/2010   Past Medical History:  Diagnosis Date  . Arthritis    "lower back; right shoulder; left thumb; joints" (06/21/2013)  . Asthma    "not sure if this is true or not" (06/21/2013)  . CAD (coronary artery disease) 06/2013   s/p PTCA/DES x 3 to mid LAD 2015. b. s/p CABG in 2018. c. cath 12/2017 -> med rx.  [name] GERD (gastroesophageal reflux disease)   . Gout    "maybe twice in my life"  . Headache(784.0)    "weekly for the last 3-4 months" (06/21/2013)  . Hyperlipidemia   . Hypertension   . Myocardial infarction [facility])    "/Dr. Katharina Caper I've had  one between 2014-2015) (06/21/2013)  . Osteoarthritis   . Sleep apnea    WEIGHT LOSS, NO LONGER NEEDS PER PATIENT  . Type II diabetes mellitus ([place])    TYPE 2    Past Surgical History:  Procedure Laterality Date  . CARDIAC CATHETERIZATION  1980's   "once"  . CARDIAC CATHETERIZATION N/A 07/23/2015   Procedure: Left Heart Cath and Coronary Angiography;  Surgeon: Sherren Mocha, MD;  Location: [place] CV LAB;  Service: Cardiovascular;  Laterality: N/A;  . CARDIOVASCULAR STRESS TEST  06/10/2008   EF 68%  . CARPAL TUNNEL RELEASE Bilateral   . CORONARY ANGIOPLASTY WITH STENT PLACEMENT  06/21/2013   "3"  . CORONARY ARTERY BYPASS GRAFT N/A 11/07/2016   Procedure: CORONARY ARTERY BYPASS GRAFTING (CABG) x 5 (LIMA to DISTAL LAD, SVG to DIAGONAL, SVG to CIRCUMFLEX, and SVG SEQUENTIALLY to PLB and DISTAL PDA) with EVH of the RIGHT GREATER SAPHENOUS VEIN and LEFT INTERNAL MAMMARY ARTERY HARVEST;  Surgeon: Grace Isaac, MD;  Location: [place];  Service: Open Heart Surgery;  Laterality: N/A;  . CORONARY STENT INTERVENTION N/A 06/07/2016   Procedure: Coronary Stent Intervention;  Surgeon: Burnell Blanks, MD;  Location: [place] CV LAB;  Service: Cardiovascular;  Laterality: N/A;  . EXCISION MORTON'S NEUROMA Left   . EYE SURGERY Left    "removed film over my eye"  . FRACTIONAL FLOW RESERVE WIRE N/A 06/26/2013   Procedure: FRACTIONAL FLOW RESERVE WIRE;  Surgeon: [name], MD;  Location: [place] CATH LAB;  Service: Cardiovascular;  Laterality: N/A;  . HERNIA REPAIR Left   . INTRAVASCULAR PRESSURE WIRE/FFR STUDY N/A 10/28/2016   Procedure: INTRAVASCULAR PRESSURE WIRE/FFR STUDY;  Surgeon: Leonie Man, MD;  Location: [place] CV LAB;  Service: Cardiovascular;  Laterality: N/A;  . JOINT REPLACEMENT Left 03/2013   "thumb"  . LEFT HEART CATH AND CORONARY ANGIOGRAPHY N/A 06/07/2016   Procedure: Left Heart Cath and Coronary Angiography;  Surgeon: Burnell Blanks, MD;  Location: [place] CV LAB;  Service: Cardiovascular;  Laterality: N/A;  . LEFT HEART CATH AND CORONARY ANGIOGRAPHY N/A 10/28/2016   Procedure: LEFT HEART CATH AND CORONARY ANGIOGRAPHY;  Surgeon: Leonie Man, MD;  Location: [place] CV LAB;  Service: Cardiovascular;  Laterality: N/A;  . LEFT HEART CATH AND CORS/GRAFTS ANGIOGRAPHY N/A 12/25/2017   Procedure: LEFT HEART CATH AND CORS/GRAFTS ANGIOGRAPHY;  Surgeon: Sherren Mocha, MD;  Location: [place] CV LAB;  Service: Cardiovascular;  Laterality: N/A;  . LEFT HEART CATHETERIZATION WITH CORONARY ANGIOGRAM N/A 06/21/2013   Procedure: LEFT HEART CATHETERIZATION WITH CORONARY ANGIOGRAM;  Surgeon: Burnell Blanks, MD;  Location: [facility] CATH LAB;  Service: Cardiovascular;  Laterality: N/A;  . LEFT HEART CATHETERIZATION WITH CORONARY ANGIOGRAM N/A 06/26/2013   Procedure: LEFT HEART CATHETERIZATION WITH CORONARY ANGIOGRAM;  Surgeon: [name], MD;  Location: [place] CATH LAB;  Service: Cardiovascular;  Laterality: N/A;  . TEE WITHOUT CARDIOVERSION N/A 11/07/2016   Procedure: TRANSESOPHAGEAL ECHOCARDIOGRAM (TEE);  Surgeon: Grace Isaac, MD;  Location: [place];  Service: Open Heart Surgery;  Laterality: N/A;  . TONSILLECTOMY    . TOTAL HIP ARTHROPLASTY Right 04/12/2016   Procedure: RIGHT TOTAL HIP ARTHROPLASTY ANTERIOR APPROACH;  Surgeon: Paralee Cancel, MD;  Location: [facility];  Service: Orthopedics;  Laterality: Right;  requests 70 mins  . TOTAL SHOULDER ARTHROPLASTY  03/01/2012   Procedure: TOTAL SHOULDER ARTHROPLASTY;  Surgeon: Marin Shutter, MD;  Location: [place];  Service: Orthopedics;  Laterality: Right;  . UMBILICAL HERNIA REPAIR      Current Outpatient Medications  Medication Sig Dispense Refill Last Dose  . acetaminophen (TYLENOL) 500 MG tablet Take 1,000 mg by mouth every 8 (eight) hours as needed (pain).    Taking  . ALPRAZolam (XANAX) 0.5 MG tablet Take 0.5 tablets (0.25 mg total) by mouth daily as needed for anxiety. (Patient taking  differently: Take 0.5 mg by mouth 2 (two) times daily as needed for anxiety. )  0 Taking  . amLODipine (NORVASC) 5 MG tablet TAKE ONE TABLET BY MOUTH TWICE DAILY  (Patient taking differently: Take 5 mg by mouth 2 (two) times daily. ) 180 tablet 2 Taking  . aspirin 81 MG tablet Take 81 mg by mouth at bedtime.    Taking  . atenolol (TENORMIN) 50 MG tablet Take 1 tablet (50 mg total) by mouth at bedtime. 90 tablet 3 Taking  . atorvastatin (LIPITOR) 80 MG tablet Take 80 mg by mouth daily at 6 PM.   Taking  . canagliflozin (INVOKANA) 300 MG TABS tablet Take 300 mg by mouth daily before breakfast.   Taking  . Cholecalciferol (VITAMIN D) 50 MCG (2000 UT) tablet Take 2,000 Units by mouth daily.     . clopidogrel (PLAVIX) 75 MG tablet TAKE ONE TABLET BY MOUTH ONE TIME DAILY WITH BREAKFAST (Patient taking differently: Take 75 mg by mouth daily. ) 90 tablet 2 Taking  . docusate sodium (COLACE) 100 MG capsule Take 100 mg by mouth daily as needed for mild constipation.   Taking  . FREESTYLE  TEST STRIPS test strip    Taking  . gabapentin (NEURONTIN) 300 MG capsule Take 300 mg by mouth 3 (three) times daily.    Taking  . hydrochlorothiazide (HYDRODIURIL) 25 MG tablet Take 25 mg by mouth daily.   Taking  . HYDROcodone-acetaminophen (NORCO) 10-325 MG tablet Take 1 tablet by mouth every 6 (six) hours as needed for moderate pain.     . Insulin Glargine, 1 Unit Dial, (TOUJEO SOLOSTAR) 300 UNIT/ML SOPN Inject 18 Units into the skin daily.     . irbesartan (AVAPRO) 300 MG tablet Take 300 mg by mouth daily.   Taking  . metFORMIN (GLUCOPHAGE-XR) 500 MG 24 hr tablet Take 500 mg by mouth 2 (two) times daily.   Taking  . Multiple Vitamin (MULTIVITAMIN) tablet Take 1 tablet by mouth daily.   Taking  . nitroGLYCERIN (NITROSTAT) 0.4 MG SL tablet Place 1 tablet (0.4 mg total) under the tongue every 5 (five) minutes as needed for chest pain. 25 tablet 3 Taking  . pantoprazole (PROTONIX) 40 MG tablet Take 1 tablet (40 mg total) by  mouth 2 (two) times daily before a meal. 60 tablet 1 Taking  . polyethylene glycol (MIRALAX / GLYCOLAX) packet Take 17 g by mouth daily as needed for mild constipation.   Taking  . promethazine (PHENERGAN) 12.5 MG tablet Take 12.5 mg by mouth every 6 (six) hours as needed for nausea or vomiting.     . ranitidine (ZANTAC) 150 MG tablet Take 1 tablet (150 mg total) by mouth 2 (two) times daily. (Patient not taking: Reported on 05/23/2018) 60 tablet 2 Not Taking at Unknown time   Current Facility-Administered Medications  Medication Dose Route Frequency Provider Last Rate Last Dose  . 0.9 %  sodium chloride infusion  500 mL Intravenous Once Doran Stabler, MD       Allergies  Allergen Reactions  . Oxycodone Nausea Only    Social History   Tobacco Use  . Smoking status: Never Smoker  . Smokeless tobacco: Never Used  Substance Use Topics  . Alcohol use: No    Family History  Problem Relation Age of Onset  . Diabetes type II Mother   . Heart attack Father   . Coronary artery disease Cousin   . Heart disease Brother     Review of Systems As stated in HPI  Objective:   Vitals:  Ht: 5 ft 9 in 05/22/2018 02:44 pm  Wt: 195 lbs 05/22/2018 02:49 pm  BMI: 28.8 05/22/2018 02:49 pm  Pain Scale: 9 05/22/2018 02:49 pm  Clinical exam: Patient is alert and oriented 3. No shortness of breath, chest pain. Abdomen: Soft and nontender. No loss of bowel and bladder control.  Lungs: Clear to auscultation bilaterally.  Cardiac: No rubs gallops murmurs. Regular rate and rhythm.   Lumbar spine: Significant back pain radiating into the left lower extremity. Leg pain has gotten worse since his last visit with me. No significant hip, knee, ankle pain with isolated joint range of motion.  Neuro: 4 out of 5 left EHL and tibialis anterior strength. Remainder of his exam is 5/5. Positive numbness and dysesthesias in the right L5 dermatome. Negative straight leg raise test. No clonus, 1+ deep tendon  reflexes at the knee and Achilles bilaterally Reflexes: Babinski: Negative Hoffman: Negative  Vascular: Lower extremity peripheral pulses are 2+ dorsalis pedis/posterior tibialis pulses bilaterally. Compartments are soft and nontender  Lumbar MRI from 04/12/18: Patient has moderate left L5-S1 neural foraminal narrowing causing mass-effect on  the exiting L5 nerve root. There is moderate by foraminal stenosis at L4-5 worse on the right than on the left. No significant collapse or degeneration of the disks. Assessment:   Spero returns today for follow-up because of ongoing severe debilitating leg pain. The patient's clinical exam is now significant for motor as well as sensory deficits that have progressed since his last visit with me. He states that despite increasing narcotic medications, the L5 selective nerve root block, and activity modification his quality-of-life continues to suffer any could no longer function with this pain.  Plan:    At this point given the fact that he is having L5 radicular weakness and numbness, and the MRI that shows L5 foraminal stenosis I have recommended moving forward with an L5 foraminotomy. This would allow the L5 nerve root to be decompressed and hopefully improve his radicular leg pain. I have discussed this with the patient as well as his wife (who was on the phone) that the goal of surgery is to reduce his radicular leg pain not necessarily to improve his chronic back pain. I have discussed the surgery in great detail and all of their questions were encouraged and addressed.  We have also gone over the risks and benefits of surgery and again all their questions were encouraged and addressed. Risks and benefits of surgery were discussed with the patient. These include: Infection, bleeding, death, stroke, paralysis, ongoing or worse pain, need for additional surgery, leak of spinal fluid, adjacent segment degeneration requiring additional surgery, post-operative  hematoma formation that can result in neurological compromise and the need for urgent/emergent re-operation. Loss in bowel and bladder control. Injury to major vessels that could result in the need for urgent abdominal surgery to stop bleeding. Risk of deep venous thrombosis (DVT) and the need for additional treatment. Recurrent disc herniation resulting in the need for revision surgery, which could include fusion surgery (utilizing instrumentation such as pedicle screws and intervertebral cages).   We will plan on getting preoperative medical clearance from his primary care physician and cardiologist. In move forward with surgery in a timely fashion.  Indications for surgery during this period of restrictions: Patient has progressive neurological deficits with L5 weakness as well as loss in sensation. Failure to improve with conservative measures. Ongoing debilitating neuropathic pain. spinal stenosis of lumbar region

## 2018-05-24 NOTE — Telephone Encounter (Signed)
   Primary Cardiologist: Sherren Mocha, MD  Chart reviewed as part of pre-operative protocol coverage. Given past medical history and time since last visit, based on ACC/AHA guidelines, Tylan Kinn. would be at acceptable risk for the planned procedure without further cardiovascular testing. He is walking 3-4 miles everyday without any issue.   Dr. Burt Knack reports that he is at low risk for holding Plavix 5 days prior to surgery with restart as soon as possible per surgical team   I will route this recommendation to the requesting party via [place] fax function and remove from pre-op pool.  Please call with questions.  Kathyrn Drown, NP 05/24/2018, 2:29 PM

## 2018-05-25 ENCOUNTER — Encounter (HOSPITAL_COMMUNITY)
Admission: RE | Admit: 2018-05-25 | Discharge: 2018-05-25 | Disposition: A | Discharge status: Home / Self Care | Payer: PPO | Source: Ambulatory Visit | Attending: Orthopedic Surgery | Admitting: Orthopedic Surgery

## 2018-05-25 ENCOUNTER — Encounter (HOSPITAL_COMMUNITY): Payer: Self-pay

## 2018-05-25 ENCOUNTER — Other Ambulatory Visit: Payer: Self-pay

## 2018-05-25 DIAGNOSIS — E785 Hyperlipidemia, unspecified: Secondary | ICD-10-CM | POA: Diagnosis not present

## 2018-05-25 DIAGNOSIS — Q250 Patent ductus arteriosus: Secondary | ICD-10-CM | POA: Insufficient documentation

## 2018-05-25 DIAGNOSIS — Z79899 Other long term (current) drug therapy: Secondary | ICD-10-CM | POA: Insufficient documentation

## 2018-05-25 DIAGNOSIS — K219 Gastro-esophageal reflux disease without esophagitis: Secondary | ICD-10-CM | POA: Insufficient documentation

## 2018-05-25 DIAGNOSIS — R410 Disorientation, unspecified: Secondary | ICD-10-CM | POA: Diagnosis not present

## 2018-05-25 DIAGNOSIS — C787 Secondary malignant neoplasm of liver and intrahepatic bile duct: Secondary | ICD-10-CM | POA: Diagnosis not present

## 2018-05-25 DIAGNOSIS — Z955 Presence of coronary angioplasty implant and graft: Secondary | ICD-10-CM | POA: Insufficient documentation

## 2018-05-25 DIAGNOSIS — Z96641 Presence of right artificial hip joint: Secondary | ICD-10-CM | POA: Insufficient documentation

## 2018-05-25 DIAGNOSIS — Z7982 Long term (current) use of aspirin: Secondary | ICD-10-CM | POA: Insufficient documentation

## 2018-05-25 DIAGNOSIS — I252 Old myocardial infarction: Secondary | ICD-10-CM | POA: Diagnosis not present

## 2018-05-25 DIAGNOSIS — J45909 Unspecified asthma, uncomplicated: Secondary | ICD-10-CM | POA: Insufficient documentation

## 2018-05-25 DIAGNOSIS — M47819 Spondylosis without myelopathy or radiculopathy, site unspecified: Secondary | ICD-10-CM | POA: Insufficient documentation

## 2018-05-25 DIAGNOSIS — I25810 Atherosclerosis of coronary artery bypass graft(s) without angina pectoris: Secondary | ICD-10-CM | POA: Insufficient documentation

## 2018-05-25 DIAGNOSIS — N186 End stage renal disease: Secondary | ICD-10-CM | POA: Diagnosis not present

## 2018-05-25 DIAGNOSIS — M19042 Primary osteoarthritis, left hand: Secondary | ICD-10-CM | POA: Insufficient documentation

## 2018-05-25 DIAGNOSIS — M48061 Spinal stenosis, lumbar region without neurogenic claudication: Secondary | ICD-10-CM | POA: Diagnosis not present

## 2018-05-25 DIAGNOSIS — C7951 Secondary malignant neoplasm of bone: Secondary | ICD-10-CM | POA: Diagnosis not present

## 2018-05-25 DIAGNOSIS — I10 Essential (primary) hypertension: Secondary | ICD-10-CM | POA: Insufficient documentation

## 2018-05-25 DIAGNOSIS — M19011 Primary osteoarthritis, right shoulder: Secondary | ICD-10-CM | POA: Diagnosis not present

## 2018-05-25 DIAGNOSIS — E041 Nontoxic single thyroid nodule: Secondary | ICD-10-CM | POA: Diagnosis not present

## 2018-05-25 DIAGNOSIS — G4733 Obstructive sleep apnea (adult) (pediatric): Secondary | ICD-10-CM | POA: Diagnosis not present

## 2018-05-25 DIAGNOSIS — E119 Type 2 diabetes mellitus without complications: Secondary | ICD-10-CM | POA: Insufficient documentation

## 2018-05-25 DIAGNOSIS — D631 Anemia in chronic kidney disease: Secondary | ICD-10-CM | POA: Diagnosis not present

## 2018-05-25 DIAGNOSIS — Z951 Presence of aortocoronary bypass graft: Secondary | ICD-10-CM | POA: Insufficient documentation

## 2018-05-25 DIAGNOSIS — M79645 Pain in left finger(s): Secondary | ICD-10-CM | POA: Diagnosis not present

## 2018-05-25 DIAGNOSIS — Z01818 Encounter for other preprocedural examination: Secondary | ICD-10-CM | POA: Diagnosis not present

## 2018-05-25 DIAGNOSIS — Z992 Dependence on renal dialysis: Secondary | ICD-10-CM | POA: Diagnosis not present

## 2018-05-25 DIAGNOSIS — E538 Deficiency of other specified B group vitamins: Secondary | ICD-10-CM | POA: Diagnosis not present

## 2018-05-25 DIAGNOSIS — Z794 Long term (current) use of insulin: Secondary | ICD-10-CM | POA: Insufficient documentation

## 2018-05-25 DIAGNOSIS — E118 Type 2 diabetes mellitus with unspecified complications: Secondary | ICD-10-CM | POA: Diagnosis not present

## 2018-05-25 DIAGNOSIS — C3490 Malignant neoplasm of unspecified part of unspecified bronchus or lung: Secondary | ICD-10-CM | POA: Diagnosis not present

## 2018-05-25 LAB — BASIC METABOLIC PANEL
Anion gap: 7 (ref 5–15)
BUN: 25 mg/dL — ABNORMAL HIGH (ref 8–23)
CO2: 28 mmol/L (ref 22–32)
Calcium: 9.7 mg/dL (ref 8.9–10.3)
Chloride: 103 mmol/L (ref 98–111)
Creatinine, Ser: 1.02 mg/dL (ref 0.61–1.24)
GFR calc Af Amer: >60 mL/min (ref >60)
GFR calc non Af Amer: >60 mL/min (ref >60)
Glucose, Bld: 179 mg/dL — ABNORMAL HIGH (ref 70–99)
Potassium: 4.3 mmol/L (ref 3.5–5.1)
Sodium: 138 mmol/L (ref 135–145)

## 2018-05-25 LAB — CBC
HCT: 45.1 % (ref 39.0–52.0)
Hemoglobin: 14.1 g/dL (ref 13.0–17.0)
MCH: 27.8 pg (ref 26.0–34.0)
MCHC: 31.3 g/dL (ref 30.0–36.0)
MCV: 88.8 fL (ref 80.0–100.0)
Platelets: 142 10*3/uL — ABNORMAL LOW (ref 150–400)
RBC: 5.08 MIL/uL (ref 4.22–5.81)
RDW: 14.4 % (ref 11.5–15.5)
WBC: 4.3 10*3/uL (ref 4.0–10.5)
nRBC: 0 % (ref 0.0–0.2)

## 2018-05-25 LAB — GLUCOSE, CAPILLARY: Glucose-Capillary: 221 mg/dL — ABNORMAL HIGH (ref 70–99)

## 2018-05-25 LAB — SURGICAL PCR SCREEN
MRSA, PCR: NEGATIVE
Staphylococcus aureus: NEGATIVE

## 2018-05-25 LAB — HEMOGLOBIN A1C
Hgb A1c MFr Bld: 7.9 % — ABNORMAL HIGH (ref 4.8–5.6)
Mean Plasma Glucose: 180.03 mg/dL

## 2018-05-25 NOTE — Progress Notes (Addendum)

## 2018-05-25 NOTE — Progress Notes (Signed)
Anesthesia Chart Review:  Case:  [id number] Date/Time:  05/31/18 0730   Procedure:  Left L5 decompression (N/A ) - 170min   Anesthesia type:  General   Pre-op diagnosis:  L5 left foraminal stenosis   Location:  [facility] ROOM 04 / [facility]   Surgeon:  Melina Schools, MD      DISCUSSION: 73 yo male never smoker. Pertinent hx includes GERD, Gout, HTN, CAD/MI, OSA, DMII, Ashtma.     Pt follows with cardiology for hx of CAD with prior DES x 3 to the LAD, prior DES x 1 to proximal PLA and balloon PCI to proximal PDA and most recently CABG x 5 in 2018. Per cardiology notes, most recent cath for chest pain 12/25/2017 with stable findings - he is to continue with his current medical management and continued CV risk factor modification. He was cleared for surgery in telephone encounter 05/24/18 stating "Given past medical history and time since last visit, based on ACC/AHA guidelines, Hunter Mcbride. would be at acceptable risk for the planned procedure without further cardiovascular testing. He is walking 3-4 miles everyday without any issue. Dr. Burt Knack reports that he is at low risk for holding Plavix 5 days prior to surgery with restart as soon as possible per surgical team."  Anticipate he can proceed as planned barring acute status change.   VS: BP 134/69   Pulse (!) 56   Temp 37.1 C (Oral)   Ht 5\' 9"  (1.753 m)   Wt 91.2 kg   SpO2 100%   BMI 29.68 kg/m   PROVIDERS: Crist Infante, MD is PCP  Sherren Mocha, MD is Cardiologist  LABS: Labs reviewed: Acceptable for surgery. (all labs ordered are listed, but only abnormal results are displayed)  Labs Reviewed  GLUCOSE, CAPILLARY - Abnormal; Notable for the following components:      Result Value   Glucose-Capillary 221 (*)    All other components within normal limits  HEMOGLOBIN A1C - Abnormal; Notable for the following components:   Hgb A1c MFr Bld 7.9 (*)    All other components within normal limits  BASIC METABOLIC PANEL - Abnormal; Notable  for the following components:   Glucose, Bld 179 (*)    BUN 25 (*)    All other components within normal limits  CBC - Abnormal; Notable for the following components:   Platelets 142 (*)    All other components within normal limits  SURGICAL PCR SCREEN     IMAGES: PORTABLE CHEST 1 VIEW 12/25/2017  COMPARISON:  12/08/2016  FINDINGS: The heart size and mediastinal contours are within normal limits. Both lungs are clear. The visualized skeletal structures are unremarkable. Postsurgical changes are again seen.  IMPRESSION: No active disease.   EKG: 12/25/2017: Sinus rhythm. Rate 63,v mild st elev II, III, AVF, V5-v6 ?stemi - pt subsequently had cath showing stable disease and med therapy recommended  CV: Cath 12/25/2017: 1.  Patent left mainstem 2.  Patent LAD with mild diffuse stenosis, severe diagonal stenosis with a patent graft 3.  Severe first obtuse marginal stenosis with a patent graft  4.  Patent RCA with mild nonobstructive disease and severe PDA stenosis with patent graft to the PDA and PLA branches 5.  Atretic LIMA to LAD, LAD supplied via antegrade native vessel flow as well as retrograde flow from the diagonal branch saphenous vein graft. 6.  Normal LVEDP  Recommend ongoing medical therapy.  If the patient is chest pain-free this evening, hospital discharge would be  reasonable.  If recurrent chest pain would observe overnight with serial cardiac markers.  TEE 11/03/2016:  Left ventricle: Normal cavity size and wall thickness. LV systolic function is low normal with an EF of 50-55%. There are no obvious wall motion abnormalities.  Aortic valve: No stenosis. Trace regurgitation.  Mitral valve: Trace regurgitation.  Right ventricle: Normal cavity size, wall thickness and ejection fraction.  Carotid US 10/28/2016: Summary:  - The vertebral arteries appear patent with antegrade flow. - Findings consistent with a 1-39 percent stenosis involving the   right  internal carotid artery and the left internal carotid   artery. Tortuous distal ICA&'s bialterally. - Right ABI of 1.2 and left ABI of 1.26 are suggestive of arterial   flow within normal limits at rest.    Past Medical History:  Diagnosis Date  . Arthritis    "lower back; right shoulder; left thumb; joints" (06/21/2013)  . Asthma    "not sure if this is true or not" (06/21/2013)  . CAD (coronary artery disease) 06/2013   s/p PTCA/DES x 3 to mid LAD 2015. b. s/p CABG in 2018. c. cath 12/2017 -> med rx.  [name] GERD (gastroesophageal reflux disease)   . Gout    "maybe twice in my life"  . Headache(784.0)    "weekly for the last 3-4 months" (06/21/2013)  . Hyperlipidemia   . Hypertension   . Myocardial infarction [facility])    "/Dr. Katharina Caper I've had one between 2014-2015) (06/21/2013)  . Osteoarthritis   . Sleep apnea    WEIGHT LOSS, NO LONGER NEEDS PER PATIENT  . Type II diabetes mellitus ([place])    TYPE 2    Past Surgical History:  Procedure Laterality Date  . CARDIAC CATHETERIZATION  1980's   "once"  . CARDIAC CATHETERIZATION N/A 07/23/2015   Procedure: Left Heart Cath and Coronary Angiography;  Surgeon: Sherren Mocha, MD;  Location: [place] CV LAB;  Service: Cardiovascular;  Laterality: N/A;  . CARDIOVASCULAR STRESS TEST  06/10/2008   EF 68%  . CARPAL TUNNEL RELEASE Bilateral   . CORONARY ANGIOPLASTY WITH STENT PLACEMENT  06/21/2013   "3"  . CORONARY ARTERY BYPASS GRAFT N/A 11/07/2016   Procedure: CORONARY ARTERY BYPASS GRAFTING (CABG) x 5 (LIMA to DISTAL LAD, SVG to DIAGONAL, SVG to CIRCUMFLEX, and SVG SEQUENTIALLY to PLB and DISTAL PDA) with EVH of the RIGHT GREATER SAPHENOUS VEIN and LEFT INTERNAL MAMMARY ARTERY HARVEST;  Surgeon: Grace Isaac, MD;  Location: [place];  Service: Open Heart Surgery;  Laterality: N/A;  . CORONARY STENT INTERVENTION N/A 06/07/2016   Procedure: Coronary Stent Intervention;  Surgeon: Burnell Blanks, MD;  Location: [place] CV LAB;  Service:  Cardiovascular;  Laterality: N/A;  . EXCISION MORTON'S NEUROMA Left   . EYE SURGERY Left    "removed film over my eye"  . FRACTIONAL FLOW RESERVE WIRE N/A 06/26/2013   Procedure: FRACTIONAL FLOW RESERVE WIRE;  Surgeon: [name], MD;  Location: [place] CATH LAB;  Service: Cardiovascular;  Laterality: N/A;  . HERNIA REPAIR Left   . INTRAVASCULAR PRESSURE WIRE/FFR STUDY N/A 10/28/2016   Procedure: INTRAVASCULAR PRESSURE WIRE/FFR STUDY;  Surgeon: Leonie Man, MD;  Location: [place] CV LAB;  Service: Cardiovascular;  Laterality: N/A;  . JOINT REPLACEMENT Left 03/2013   "thumb"  . LEFT HEART CATH AND CORONARY ANGIOGRAPHY N/A 06/07/2016   Procedure: Left Heart Cath and Coronary Angiography;  Surgeon: Burnell Blanks, MD;  Location: [place] CV LAB;  Service: Cardiovascular;  Laterality:  N/A;  . LEFT HEART CATH AND CORONARY ANGIOGRAPHY N/A 10/28/2016   Procedure: LEFT HEART CATH AND CORONARY ANGIOGRAPHY;  Surgeon: Leonie Man, MD;  Location: [place] CV LAB;  Service: Cardiovascular;  Laterality: N/A;  . LEFT HEART CATH AND CORS/GRAFTS ANGIOGRAPHY N/A 12/25/2017   Procedure: LEFT HEART CATH AND CORS/GRAFTS ANGIOGRAPHY;  Surgeon: Sherren Mocha, MD;  Location: [place] CV LAB;  Service: Cardiovascular;  Laterality: N/A;  . LEFT HEART CATHETERIZATION WITH CORONARY ANGIOGRAM N/A 06/21/2013   Procedure: LEFT HEART CATHETERIZATION WITH CORONARY ANGIOGRAM;  Surgeon: Burnell Blanks, MD;  Location: [facility] CATH LAB;  Service: Cardiovascular;  Laterality: N/A;  . LEFT HEART CATHETERIZATION WITH CORONARY ANGIOGRAM N/A 06/26/2013   Procedure: LEFT HEART CATHETERIZATION WITH CORONARY ANGIOGRAM;  Surgeon: [name], MD;  Location: [place] CATH LAB;  Service: Cardiovascular;  Laterality: N/A;  . TEE WITHOUT CARDIOVERSION N/A 11/07/2016   Procedure: TRANSESOPHAGEAL ECHOCARDIOGRAM (TEE);  Surgeon: Grace Isaac, MD;  Location: [place];  Service: Open Heart Surgery;  Laterality: N/A;   . TONSILLECTOMY    . TOTAL HIP ARTHROPLASTY Right 04/12/2016   Procedure: RIGHT TOTAL HIP ARTHROPLASTY ANTERIOR APPROACH;  Surgeon: Paralee Cancel, MD;  Location: [facility];  Service: Orthopedics;  Laterality: Right;  requests 70 mins  . TOTAL SHOULDER ARTHROPLASTY  03/01/2012   Procedure: TOTAL SHOULDER ARTHROPLASTY;  Surgeon: Marin Shutter, MD;  Location: [place];  Service: Orthopedics;  Laterality: Right;  . UMBILICAL HERNIA REPAIR      MEDICATIONS: . acetaminophen (TYLENOL) 500 MG tablet  . ALPRAZolam (XANAX) 0.5 MG tablet  . amLODipine (NORVASC) 5 MG tablet  . aspirin 81 MG tablet  . atenolol (TENORMIN) 50 MG tablet  . atorvastatin (LIPITOR) 80 MG tablet  . canagliflozin (INVOKANA) 300 MG TABS tablet  . Cholecalciferol (VITAMIN D) 50 MCG (2000 UT) tablet  . clopidogrel (PLAVIX) 75 MG tablet  . docusate sodium (COLACE) 100 MG capsule  . FREESTYLE TEST STRIPS test strip  . gabapentin (NEURONTIN) 300 MG capsule  . hydrochlorothiazide (HYDRODIURIL) 25 MG tablet  . HYDROcodone-acetaminophen (NORCO) 10-325 MG tablet  . Insulin Glargine, 1 Unit Dial, (TOUJEO SOLOSTAR) 300 UNIT/ML SOPN  . irbesartan (AVAPRO) 300 MG tablet  . metFORMIN (GLUCOPHAGE-XR) 500 MG 24 hr tablet  . Multiple Vitamin (MULTIVITAMIN) tablet  . nitroGLYCERIN (NITROSTAT) 0.4 MG SL tablet  . pantoprazole (PROTONIX) 40 MG tablet  . polyethylene glycol (MIRALAX / GLYCOLAX) packet  . promethazine (PHENERGAN) 12.5 MG tablet  . ranitidine (ZANTAC) 150 MG tablet   . 0.9 %  sodium chloride infusion   [place] Musty [facility] Short Stay Center/Anesthesiology Phone [phone] 05/25/2018 12:22 PM

## 2018-05-25 NOTE — Progress Notes (Signed)
PCP - Prini  Cardiologist - Cooper  Chest x-ray - 05-25-18(Epic)  EKG - 12/25/17(Epic)  Stress Test - 07-28-15  ECHO - 11-07-16(Epic)  Cardiac Cath - 12-25-17(Epic)  AICD-denies PM-denies LOOP-denies  Sleep Study - yes CPAP - No  LABS-CBC,BMP,A1C  ASA-Pt advised to call Drs. Office to find out when to stop it. Plavix -LD 05-25-18  HA1C-05-25-18 Fasting Blood Sugar - 92. (CBG at PAT visit=221) Checks Blood Sugar ___1__ times a day.   Anesthesia-Y. Extensive cardiac history  Pt denies having chest pain, sob, or fever at this time. All instructions explained to the pt, with a verbal understanding of the material. Pt agrees to go over the instructions while at home for a better understanding. The opportunity to ask questions was provided.

## 2018-05-28 DIAGNOSIS — M5136 Other intervertebral disc degeneration, lumbar region: Secondary | ICD-10-CM | POA: Diagnosis not present

## 2018-05-28 NOTE — Anesthesia Preprocedure Evaluation (Addendum)
Anesthesia Evaluation  Patient identified by MRN, date of birth, ID band Patient awake    Reviewed: Allergy & Precautions, NPO status , Patient's Chart, lab work & pertinent test results  Airway Mallampati: II  TM Distance: >3 FB Neck ROM: Full    Dental no notable dental hx.    Pulmonary neg pulmonary ROS,    Pulmonary exam normal breath sounds clear to auscultation       Cardiovascular hypertension, + CAD, + Past MI, + Cardiac Stents and + CABG  Normal cardiovascular exam Rhythm:Regular Rate:Normal     Neuro/Psych negative neurological ROS  negative psych ROS   GI/Hepatic Neg liver ROS, GERD  ,  Endo/Other  diabetes  Renal/GU negative Renal ROS  negative genitourinary   Musculoskeletal negative musculoskeletal ROS (+)   Abdominal   Peds negative pediatric ROS (+)  Hematology negative hematology ROS (+)   Anesthesia Other Findings   Reproductive/Obstetrics negative OB ROS                            Anesthesia Physical Anesthesia Plan  ASA: III  Anesthesia Plan: General   Post-op Pain Management:    Induction: Intravenous and Rapid sequence  PONV Risk Score and Plan: 2 and Ondansetron, Dexamethasone and Treatment may vary due to age or medical condition  Airway Management Planned: Oral ETT  Additional Equipment:   Intra-op Plan:   Post-operative Plan: Extubation in OR  Informed Consent: I have reviewed the patients History and Physical, chart, labs and discussed the procedure including the risks, benefits and alternatives for the proposed anesthesia with the patient or authorized representative who has indicated his/her understanding and acceptance.     Dental advisory given  Plan Discussed with: CRNA and Surgeon  Anesthesia Plan Comments: (See PAT note by Karoline Caldwell, PA-C )       Anesthesia Quick Evaluation

## 2018-05-30 ENCOUNTER — Encounter (HOSPITAL_COMMUNITY): Payer: Self-pay

## 2018-05-30 ENCOUNTER — Ambulatory Visit (HOSPITAL_COMMUNITY): Payer: PPO | Admitting: Physician Assistant

## 2018-05-30 ENCOUNTER — Ambulatory Visit (HOSPITAL_COMMUNITY): Payer: PPO | Admitting: Certified Registered Nurse Anesthetist

## 2018-05-30 ENCOUNTER — Ambulatory Visit (HOSPITAL_COMMUNITY)
Admission: RE | Disposition: A | Discharge status: Home / Self Care | Payer: Self-pay | Source: Home / Self Care | Attending: Orthopedic Surgery

## 2018-05-30 ENCOUNTER — Ambulatory Visit (HOSPITAL_COMMUNITY): Payer: PPO

## 2018-05-30 ENCOUNTER — Ambulatory Visit (HOSPITAL_COMMUNITY)
Admission: RE | Admit: 2018-05-30 | Discharge: 2018-05-30 | Disposition: A | Discharge status: Home / Self Care | Payer: PPO | Attending: Orthopedic Surgery | Admitting: Orthopedic Surgery

## 2018-05-30 ENCOUNTER — Other Ambulatory Visit: Payer: Self-pay

## 2018-05-30 DIAGNOSIS — K219 Gastro-esophageal reflux disease without esophagitis: Secondary | ICD-10-CM | POA: Insufficient documentation

## 2018-05-30 DIAGNOSIS — Z96641 Presence of right artificial hip joint: Secondary | ICD-10-CM | POA: Diagnosis not present

## 2018-05-30 DIAGNOSIS — Z794 Long term (current) use of insulin: Secondary | ICD-10-CM | POA: Diagnosis not present

## 2018-05-30 DIAGNOSIS — I10 Essential (primary) hypertension: Secondary | ICD-10-CM | POA: Diagnosis not present

## 2018-05-30 DIAGNOSIS — Z96611 Presence of right artificial shoulder joint: Secondary | ICD-10-CM | POA: Diagnosis not present

## 2018-05-30 DIAGNOSIS — Z6829 Body mass index (BMI) 29.0-29.9, adult: Secondary | ICD-10-CM | POA: Insufficient documentation

## 2018-05-30 DIAGNOSIS — Z955 Presence of coronary angioplasty implant and graft: Secondary | ICD-10-CM | POA: Insufficient documentation

## 2018-05-30 DIAGNOSIS — M5416 Radiculopathy, lumbar region: Secondary | ICD-10-CM | POA: Diagnosis not present

## 2018-05-30 DIAGNOSIS — I2510 Atherosclerotic heart disease of native coronary artery without angina pectoris: Secondary | ICD-10-CM | POA: Diagnosis not present

## 2018-05-30 DIAGNOSIS — Z79899 Other long term (current) drug therapy: Secondary | ICD-10-CM | POA: Diagnosis not present

## 2018-05-30 DIAGNOSIS — E663 Overweight: Secondary | ICD-10-CM | POA: Diagnosis not present

## 2018-05-30 DIAGNOSIS — Z951 Presence of aortocoronary bypass graft: Secondary | ICD-10-CM | POA: Insufficient documentation

## 2018-05-30 DIAGNOSIS — Z419 Encounter for procedure for purposes other than remedying health state, unspecified: Secondary | ICD-10-CM

## 2018-05-30 DIAGNOSIS — M5116 Intervertebral disc disorders with radiculopathy, lumbar region: Secondary | ICD-10-CM | POA: Diagnosis not present

## 2018-05-30 DIAGNOSIS — M19011 Primary osteoarthritis, right shoulder: Secondary | ICD-10-CM | POA: Insufficient documentation

## 2018-05-30 DIAGNOSIS — E785 Hyperlipidemia, unspecified: Secondary | ICD-10-CM | POA: Insufficient documentation

## 2018-05-30 DIAGNOSIS — I252 Old myocardial infarction: Secondary | ICD-10-CM | POA: Diagnosis not present

## 2018-05-30 DIAGNOSIS — M5187 Other intervertebral disc disorders, lumbosacral region: Secondary | ICD-10-CM | POA: Diagnosis not present

## 2018-05-30 DIAGNOSIS — M48061 Spinal stenosis, lumbar region without neurogenic claudication: Secondary | ICD-10-CM | POA: Diagnosis not present

## 2018-05-30 DIAGNOSIS — Z7982 Long term (current) use of aspirin: Secondary | ICD-10-CM | POA: Diagnosis not present

## 2018-05-30 DIAGNOSIS — Z7902 Long term (current) use of antithrombotics/antiplatelets: Secondary | ICD-10-CM | POA: Diagnosis not present

## 2018-05-30 HISTORY — PX: LUMBAR LAMINECTOMY/DECOMPRESSION MICRODISCECTOMY: SHX5026

## 2018-05-30 LAB — POCT I-STAT 4, (NA,K, GLUC, HGB,HCT)
Glucose, Bld: 106 mg/dL — ABNORMAL HIGH (ref 70–99)
HCT: 38 % — ABNORMAL LOW (ref 39.0–52.0)
Hemoglobin: 12.9 g/dL — ABNORMAL LOW (ref 13.0–17.0)
Potassium: 4 mmol/L (ref 3.5–5.1)
Sodium: 140 mmol/L (ref 135–145)

## 2018-05-30 LAB — GLUCOSE, CAPILLARY
Glucose-Capillary: 93 mg/dL (ref 70–99)
Glucose-Capillary: 115 mg/dL — ABNORMAL HIGH (ref 70–99)

## 2018-05-30 SURGERY — LUMBAR LAMINECTOMY/DECOMPRESSION MICRODISCECTOMY 1 LEVEL
Anesthesia: General | Site: Spine Lumbar | Laterality: Left

## 2018-05-30 MED ORDER — SODIUM CHLORIDE 0.9 % IV SOLN
INTRAVENOUS | Status: DC | PRN
  Administered 2018-05-30: 25 ug/min via INTRAVENOUS

## 2018-05-30 MED ORDER — ONDANSETRON HCL 4 MG/2ML IJ SOLN
INTRAMUSCULAR | Status: AC
  Filled 2018-05-30: qty 2

## 2018-05-30 MED ORDER — THROMBIN 20000 UNITS EX SOLR
CUTANEOUS | Status: DC | PRN
  Administered 2018-05-30: 20 mL via TOPICAL

## 2018-05-30 MED ORDER — DEXAMETHASONE SODIUM PHOSPHATE 10 MG/ML IJ SOLN
INTRAMUSCULAR | Status: DC | PRN
  Administered 2018-05-30: 5 mg via INTRAVENOUS

## 2018-05-30 MED ORDER — DEXAMETHASONE SODIUM PHOSPHATE 10 MG/ML IJ SOLN
INTRAMUSCULAR | Status: AC
  Filled 2018-05-30: qty 2

## 2018-05-30 MED ORDER — EPHEDRINE SULFATE 50 MG/ML IJ SOLN
INTRAMUSCULAR | Status: DC | PRN
  Administered 2018-05-30 (×3): 5 mg via INTRAVENOUS

## 2018-05-30 MED ORDER — PROPOFOL 10 MG/ML IV BOLUS
INTRAVENOUS | Status: AC
  Filled 2018-05-30: qty 20

## 2018-05-30 MED ORDER — HEMOSTATIC AGENTS (NO CHARGE) OPTIME
TOPICAL | Status: DC | PRN
  Administered 2018-05-30: 1

## 2018-05-30 MED ORDER — PROMETHAZINE HCL 25 MG/ML IJ SOLN: 6.2500 mg | INTRAMUSCULAR | Status: DC | PRN

## 2018-05-30 MED ORDER — LIDOCAINE 2% (20 MG/ML) 5 ML SYRINGE
INTRAMUSCULAR | Status: AC
  Filled 2018-05-30: qty 5

## 2018-05-30 MED ORDER — ROCURONIUM BROMIDE 50 MG/5ML IV SOSY
PREFILLED_SYRINGE | INTRAVENOUS | Status: AC
  Filled 2018-05-30: qty 5

## 2018-05-30 MED ORDER — ONDANSETRON HCL 4 MG/2ML IJ SOLN
INTRAMUSCULAR | Status: DC | PRN
  Administered 2018-05-30: 4 mg via INTRAVENOUS

## 2018-05-30 MED ORDER — EPHEDRINE 5 MG/ML INJ
INTRAVENOUS | Status: AC
  Filled 2018-05-30: qty 10

## 2018-05-30 MED ORDER — SUCCINYLCHOLINE CHLORIDE 20 MG/ML IJ SOLN
INTRAMUSCULAR | Status: DC | PRN
  Administered 2018-05-30: 120 mg via INTRAVENOUS

## 2018-05-30 MED ORDER — ROCURONIUM BROMIDE 100 MG/10ML IV SOLN
INTRAVENOUS | Status: DC | PRN
  Administered 2018-05-30: 50 mg via INTRAVENOUS

## 2018-05-30 MED ORDER — MIDAZOLAM HCL 2 MG/2ML IJ SOLN
INTRAMUSCULAR | Status: AC
  Filled 2018-05-30: qty 2

## 2018-05-30 MED ORDER — BUPIVACAINE-EPINEPHRINE (PF) 0.25% -1:200000 IJ SOLN
INTRAMUSCULAR | Status: AC
  Filled 2018-05-30: qty 30

## 2018-05-30 MED ORDER — PHENYLEPHRINE HCL (PRESSORS) 10 MG/ML IV SOLN
INTRAVENOUS | Status: DC | PRN
  Administered 2018-05-30: 80 ug via INTRAVENOUS

## 2018-05-30 MED ORDER — LIDOCAINE HCL (CARDIAC) PF 100 MG/5ML IV SOSY
PREFILLED_SYRINGE | INTRAVENOUS | Status: DC | PRN
  Administered 2018-05-30: 100 mg via INTRAVENOUS

## 2018-05-30 MED ORDER — METHYLPREDNISOLONE ACETATE 40 MG/ML IJ SUSP
INTRAMUSCULAR | Status: AC
  Filled 2018-05-30: qty 1

## 2018-05-30 MED ORDER — HYDROMORPHONE HCL 1 MG/ML IJ SOLN
0.2500 mg | INTRAMUSCULAR | Status: DC | PRN
  Administered 2018-05-30 (×2): 0.5 mg via INTRAVENOUS

## 2018-05-30 MED ORDER — THROMBIN (RECOMBINANT) 20000 UNITS EX SOLR
CUTANEOUS | Status: AC
  Filled 2018-05-30: qty 20000

## 2018-05-30 MED ORDER — MIDAZOLAM HCL 5 MG/5ML IJ SOLN
INTRAMUSCULAR | Status: DC | PRN
  Administered 2018-05-30: 1 mg via INTRAVENOUS

## 2018-05-30 MED ORDER — HYDROCODONE-ACETAMINOPHEN 10-325 MG PO TABS: 1.0000 | ORAL_TABLET | Freq: Four times a day (QID) | ORAL | 0 refills | Status: AC | PRN

## 2018-05-30 MED ORDER — PROPOFOL 10 MG/ML IV BOLUS
INTRAVENOUS | Status: DC | PRN
  Administered 2018-05-30: 170 mg via INTRAVENOUS

## 2018-05-30 MED ORDER — ACETAMINOPHEN 10 MG/ML IV SOLN: 1000.0000 mg | Freq: Once | INTRAVENOUS | Status: DC | PRN

## 2018-05-30 MED ORDER — LACTATED RINGERS IV SOLN
INTRAVENOUS | Status: DC | PRN
  Administered 2018-05-30: 08:00:00 via INTRAVENOUS

## 2018-05-30 MED ORDER — ONDANSETRON HCL 4 MG PO TABS: 4.0000 mg | ORAL_TABLET | Freq: Three times a day (TID) | ORAL | 0 refills | Status: DC | PRN

## 2018-05-30 MED ORDER — TRANEXAMIC ACID-NACL 1000-0.7 MG/100ML-% IV SOLN
1000.0000 mg | INTRAVENOUS | Status: AC
  Administered 2018-05-30: 1000 mg via INTRAVENOUS
  Filled 2018-05-30: qty 100

## 2018-05-30 MED ORDER — METHYLPREDNISOLONE ACETATE 40 MG/ML IJ SUSP
INTRAMUSCULAR | Status: DC | PRN
  Administered 2018-05-30: 20 mg

## 2018-05-30 MED ORDER — PHENYLEPHRINE 40 MCG/ML (10ML) SYRINGE FOR IV PUSH (FOR BLOOD PRESSURE SUPPORT)
PREFILLED_SYRINGE | INTRAVENOUS | Status: AC
  Filled 2018-05-30: qty 10

## 2018-05-30 MED ORDER — FENTANYL CITRATE (PF) 250 MCG/5ML IJ SOLN
INTRAMUSCULAR | Status: AC
  Filled 2018-05-30: qty 5

## 2018-05-30 MED ORDER — METHOCARBAMOL 500 MG PO TABS: 500.0000 mg | ORAL_TABLET | Freq: Three times a day (TID) | ORAL | 0 refills | Status: AC | PRN

## 2018-05-30 MED ORDER — SUGAMMADEX SODIUM 200 MG/2ML IV SOLN
INTRAVENOUS | Status: DC | PRN
  Administered 2018-05-30: 200 mg via INTRAVENOUS

## 2018-05-30 MED ORDER — CEFAZOLIN SODIUM-DEXTROSE 2-4 GM/100ML-% IV SOLN
2.0000 g | INTRAVENOUS | Status: AC
  Administered 2018-05-30: 2 g via INTRAVENOUS
  Filled 2018-05-30: qty 100

## 2018-05-30 MED ORDER — SUCCINYLCHOLINE CHLORIDE 200 MG/10ML IV SOSY
PREFILLED_SYRINGE | INTRAVENOUS | Status: AC
  Filled 2018-05-30: qty 10

## 2018-05-30 MED ORDER — 0.9 % SODIUM CHLORIDE (POUR BTL) OPTIME
TOPICAL | Status: DC | PRN
  Administered 2018-05-30: 1000 mL

## 2018-05-30 MED ORDER — FENTANYL CITRATE (PF) 250 MCG/5ML IJ SOLN
INTRAMUSCULAR | Status: DC | PRN
  Administered 2018-05-30 (×5): 50 ug via INTRAVENOUS

## 2018-05-30 MED ORDER — HYDROMORPHONE HCL 1 MG/ML IJ SOLN
INTRAMUSCULAR | Status: AC
  Filled 2018-05-30: qty 1

## 2018-05-30 MED ORDER — BUPIVACAINE-EPINEPHRINE 0.25% -1:200000 IJ SOLN
INTRAMUSCULAR | Status: DC | PRN
  Administered 2018-05-30: 10 mL

## 2018-05-30 SURGICAL SUPPLY — 59 items
BNDG GAUZE ELAST 4 BULKY (GAUZE/BANDAGES/DRESSINGS) ×2 IMPLANT
CANISTER SUCT 3000ML PPV (MISCELLANEOUS) ×2 IMPLANT
CLSR STERI-STRIP ANTIMIC 1/2X4 (GAUZE/BANDAGES/DRESSINGS) ×2 IMPLANT
CORD BI POLAR (MISCELLANEOUS) ×2 IMPLANT
COVER SURGICAL LIGHT HANDLE (MISCELLANEOUS) ×2 IMPLANT
COVER WAND RF STERILE (DRAPES) ×2 IMPLANT
DECANTER SPIKE VIAL GLASS SM (MISCELLANEOUS) ×1 IMPLANT
DRAIN CHANNEL 15F RND FF W/TCR (WOUND CARE) IMPLANT
DRAPE POUCH INSTRU U-SHP 10X18 (DRAPES) ×2 IMPLANT
DRAPE SURG 17X23 STRL (DRAPES) ×2 IMPLANT
DRAPE U-SHAPE 47X51 STRL (DRAPES) ×2 IMPLANT
DRSG OPSITE POSTOP 3X4 (GAUZE/BANDAGES/DRESSINGS) ×2 IMPLANT
DRSG OPSITE POSTOP 4X6 (GAUZE/BANDAGES/DRESSINGS) ×1 IMPLANT
DURAPREP 26ML APPLICATOR (WOUND CARE) ×2 IMPLANT
ELECT BLADE 4.0 EZ CLEAN MEGAD (MISCELLANEOUS) ×2
ELECT CAUTERY BLADE 6.4 (BLADE) ×2 IMPLANT
ELECT PENCIL ROCKER SW 15FT (MISCELLANEOUS) ×2 IMPLANT
ELECT REM PT RETURN 9FT ADLT (ELECTROSURGICAL) ×2
ELECTRODE BLDE 4.0 EZ CLN MEGD (MISCELLANEOUS) IMPLANT
ELECTRODE REM PT RTRN 9FT ADLT (ELECTROSURGICAL) ×1 IMPLANT
EVACUATOR SILICONE 100CC (DRAIN) IMPLANT
FLOSEAL 10ML (HEMOSTASIS) IMPLANT
GLOVE BIO SURGEON STRL SZ 6.5 (GLOVE) ×2 IMPLANT
GLOVE BIOGEL PI IND STRL 6.5 (GLOVE) ×1 IMPLANT
GLOVE BIOGEL PI IND STRL 8.5 (GLOVE) ×1 IMPLANT
GLOVE BIOGEL PI INDICATOR 6.5 (GLOVE) ×1
GLOVE BIOGEL PI INDICATOR 8.5 (GLOVE) ×1
GLOVE SS BIOGEL STRL SZ 8.5 (GLOVE) ×1 IMPLANT
GLOVE SUPERSENSE BIOGEL SZ 8.5 (GLOVE) ×1
GOWN STRL REUS W/ TWL LRG LVL3 (GOWN DISPOSABLE) ×2 IMPLANT
GOWN STRL REUS W/TWL 2XL LVL3 (GOWN DISPOSABLE) ×2 IMPLANT
GOWN STRL REUS W/TWL LRG LVL3 (GOWN DISPOSABLE) ×4
KIT BASIN OR (CUSTOM PROCEDURE TRAY) ×2 IMPLANT
KIT TURNOVER KIT B (KITS) ×2 IMPLANT
NDL SPNL 18GX3.5 QUINCKE PK (NEEDLE) ×2 IMPLANT
NEEDLE 22X1 1/2 (OR ONLY) (NEEDLE) ×2 IMPLANT
NEEDLE SPNL 18GX3.5 QUINCKE PK (NEEDLE) ×4 IMPLANT
NS IRRIG 1000ML POUR BTL (IV SOLUTION) ×2 IMPLANT
PACK LAMINECTOMY ORTHO (CUSTOM PROCEDURE TRAY) ×2 IMPLANT
PACK UNIVERSAL I (CUSTOM PROCEDURE TRAY) ×2 IMPLANT
PAD ARMBOARD 7.5X6 YLW CONV (MISCELLANEOUS) ×5 IMPLANT
PATTIES SURGICAL .5 X.5 (GAUZE/BANDAGES/DRESSINGS) ×2 IMPLANT
PATTIES SURGICAL .5 X1 (DISPOSABLE) ×2 IMPLANT
SPONGE SURGIFOAM ABS GEL 100 (HEMOSTASIS) ×1 IMPLANT
SUT BONE WAX W31G (SUTURE) ×2 IMPLANT
SUT MON AB 3-0 SH 27 (SUTURE) ×2
SUT MON AB 3-0 SH27 (SUTURE) ×1 IMPLANT
SUT VIC AB 0 CT1 27 (SUTURE)
SUT VIC AB 0 CT1 27XBRD ANBCTR (SUTURE) IMPLANT
SUT VIC AB 1 CT1 18XCR BRD 8 (SUTURE) ×1 IMPLANT
SUT VIC AB 1 CT1 8-18 (SUTURE) ×2
SUT VIC AB 2-0 CT1 18 (SUTURE) ×2 IMPLANT
SYR BULB IRRIGATION 50ML (SYRINGE) ×2 IMPLANT
SYR CONTROL 10ML LL (SYRINGE) ×2 IMPLANT
SYR TB 1ML LUER SLIP (SYRINGE) ×2 IMPLANT
TOWEL GREEN STERILE (TOWEL DISPOSABLE) ×2 IMPLANT
TOWEL GREEN STERILE FF (TOWEL DISPOSABLE) ×2 IMPLANT
WATER STERILE IRR 1000ML POUR (IV SOLUTION) ×2 IMPLANT
YANKAUER SUCT BULB TIP NO VENT (SUCTIONS) IMPLANT

## 2018-05-30 NOTE — OR Nursing (Signed)
Per protocol, Dr. Dara Lords called at 09:26 am, and confirmed surgical instrument was pointing at L5 foramen.

## 2018-05-30 NOTE — Anesthesia Postprocedure Evaluation (Signed)
Anesthesia Post Note  Patient: Hunter Mcbride.  Procedure(s) Performed: Left  Gill L5 decompression (Left Spine Lumbar)     Patient location during evaluation: PACU Anesthesia Type: General Level of consciousness: awake and alert Pain management: pain level controlled Vital Signs Assessment: post-procedure vital signs reviewed and stable Respiratory status: spontaneous breathing, nonlabored ventilation, respiratory function stable and patient connected to nasal cannula oxygen Cardiovascular status: blood pressure returned to baseline and stable Postop Assessment: no apparent nausea or vomiting Anesthetic complications: no    Last Vitals:  Vitals:   05/30/18 1123 05/30/18 1137  BP: 119/66   Pulse: 63 64  Resp: 15 13  Temp:    SpO2: 99% 94%    Last Pain:  Vitals:   05/30/18 1123  TempSrc:   PainSc: 4                  Vasti Yagi S

## 2018-05-30 NOTE — H&P (Signed)
Addendum H&P dictated by Dr. Rolena Infante  No change in history or physical exam since last office visit of 05/24/2018. Clinical exam the patient marker is unchanged.  Ongoing radicular left L5 pain.  Plan on a L5 foraminotomy and laminotomy for decompression of the L5 nerve root.  All appropriate risks benefits and alternatives as well as the surgical plan was reviewed with the patient.  All of his questions were addressed.

## 2018-05-30 NOTE — Anesthesia Procedure Notes (Signed)
Procedure Name: Intubation Date/Time: 05/30/2018 8:38 AM Performed by: Shirlyn Goltz, CRNA Pre-anesthesia Checklist: Patient identified, Emergency Drugs available, Suction available and Patient being monitored Patient Re-evaluated:Patient Re-evaluated prior to induction Oxygen Delivery Method: Circle system utilized Preoxygenation: Pre-oxygenation with 100% oxygen Induction Type: IV induction and Rapid sequence Laryngoscope Size: Mac and 4 Grade View: Grade II Tube type: Oral Tube size: 7.5 mm Number of attempts: 1 Airway Equipment and Method: Stylet Placement Confirmation: ETT inserted through vocal cords under direct vision,  positive ETCO2 and breath sounds checked- equal and bilateral Secured at: 23 cm Tube secured with: Tape Dental Injury: Teeth and Oropharynx as per pre-operative assessment

## 2018-05-30 NOTE — Op Note (Signed)
Operative report  Preoperative diagnosis: L5 foraminal stenosis with L5 radiculopathy left side  Postoperative diagnosis: Same  Operative procedure: Left L5 Gill decompression ( L5 laminotomy with foramenectomy for L5 nerve root decompression).  First Assistant: Cleta Alberts, PA  Complications: None  Indications: This is a very pleasant 73 year old gentleman presented my office with severe left L5 radicular leg pain.  Despite appropriate conservative management he continued to deteriorate.  Preoperative imaging studies demonstrated moderate to severe foraminal stenosis with L5 nerve compression.  As a result of the failure of conservative management and the progressive neurological deficit we elected to move forward with surgery.  All appropriate risks benefits and alternatives were discussed with the patient and consent was obtained.  Operative note patient was brought to the operating room placed upon the operating room table.  After successful induction of general anesthesia and endotracheal ovation teds SCDs were applied and he was turned prone onto the Wilson frame.  All bony prominences well-padded and the back was prepped and draped in a standard fashion.  Timeout was taken confirming patient procedure and all other important data.  2 needles were placed into the back and an x-ray was taken to confirm that I was at the appropriate level.  Once I confirm this I marked out the incision site approximately 1 fingerbreadth lateral to the midline.  I infiltrated this with quarter percent Marcaine with epinephrine and then made a small incision.  Sharp dissection was carried out down to the deep fascia.  The fascia was sharply incised and I bluntly dissected through the paraspinal muscles until I could palpate the L4-5 and L5-S1 sac complex as well as the L5 pars and the L5 lamina.  Once I could palpate this I then placed a self-retaining retractor to expose the area.  A Penfield 4 was placed on the  lateral aspect of the L5 pars and an intraoperative x-ray was taken.  I confirm that the field for marked out the L5 foramen.  Once I confirmed I was at the appropriate level I then began my decompression.  Laminotomy was L5 was performed using a 3 mm Kerrison Roger.  I eventually released the ligamentum flavum from the undersurface of the lamina and expose the lateral aspect of the thecal sac.  This allowed me to dissect into the lateral recess and sweep the S1 nerve root medially and hold it protected with a patty.  This allowed me to work and resect the medial aspect of the facet complex.  I then used an osteotome to complete the inferior L5 facetectomy.  Once this was removed I then created remove the remaining portion of the pars and the superior portion of the S1 facet complex.  I now could visualize the L5-S1 disc space.  I identify the L5 nerve root and traced it back medially to the level of the L5 pedicle.  I was then able to freely palpate superiorly to just above the L5 pedicle confirming had an adequate decompression at the level of the L5 pedicle.  When examining the MRI confirmed that there was no significant stenosis in this area.  At this point I traced the L5 nerve root out into the foramen and made sure I had completely removed the pars and completely decompress the L5 nerve root in the L5 foramen.  Once I confirm this I irrigated the wound copiously with normal saline and then inspected the disc space.  The preop imaging did demonstrate a posterior disc bulge at the level  of the foramen but I elected not to resect this.  There was no significant disc herniation when I directly evaluated the area more of an annular bulging.  Given the fact that it completely unroofed the foramen I did not feel as though further insult to the disc was necessary.  Given this I irrigated the wound copiously normal saline and then used bipolar cautery and FloSeal to obtain and maintain hemostasis.  Once that  hemostasis I irrigated again and then reinspected to ensure an adequate decompression of the L5 nerve root.  Once confirmed I then placed half a cc of Depo-Medrol (20 mg) directly over the L5 nerve root for postoperative analgesia.  A thrombin-soaked Gelfoam patty was placed over the laminotomy site.  The retractor was removed and I closed the deep fascia in a layered fashion with interrupted #1 Vicryl suture, the 2-0 Vicryl suture, and a 3-0 Monocryl for the skin.  Steri-Strips and a dry dressing were applied and the patient was ultimately extubated and transferred the PACU without incident.  At the end of the case all needle and sponge counts were correct.  There are no adverse intraoperative events.

## 2018-05-30 NOTE — Transfer of Care (Signed)
Immediate Anesthesia Transfer of Care Note  Patient: Hunter Mcbride.  Procedure(s) Performed: Left  Gill L5 decompression (Left Spine Lumbar)  Patient Location: PACU  Anesthesia Type:General  Level of Consciousness: awake, alert , oriented and patient cooperative  Airway & Oxygen Therapy: Patient Spontanous Breathing and Patient connected to face mask oxygen  Post-op Assessment: Report given to RN and Post -op Vital signs reviewed and stable  Post vital signs: Reviewed and stable  Last Vitals:  Vitals Value Taken Time  BP 154/73 05/30/2018 10:35 AM  Temp    Pulse 70 05/30/2018 10:36 AM  Resp 14 05/30/2018 10:36 AM  SpO2 100 % 05/30/2018 10:36 AM  Vitals shown include unvalidated device data.  Last Pain:  Vitals:   05/30/18 0705  TempSrc: Oral  PainSc: 7       Patients Stated Pain Goal: 3 (30/07/62 2633)  Complications: No apparent anesthesia complications

## 2018-05-30 NOTE — Brief Op Note (Signed)
05/30/2018  10:23 AM  PATIENT:  Hunter Mcbride.  73 y.o. male  PRE-OPERATIVE DIAGNOSIS:  L5 left foraminal stenosis  POST-OPERATIVE DIAGNOSIS:  L5 left foraminal stenosis  PROCEDURE:  Procedure(s) with comments: Left  Gill L5 decompression (Left) - 149min  SURGEON:  Surgeon(s) and Role:    Melina Schools, MD - Primary  PHYSICIAN ASSISTANT:   ASSISTANTS: Amanda Ward, PA   ANESTHESIA:   general  EBL:  50 mL   BLOOD ADMINISTERED:none  DRAINS: none   LOCAL MEDICATIONS USED:  MARCAINE    and OTHER depomedrol  SPECIMEN:  No Specimen  DISPOSITION OF SPECIMEN:  N/A  COUNTS:  YES  TOURNIQUET:  * No tourniquets in log *  DICTATION: .Dragon Dictation  PLAN OF CARE: Discharge to home after PACU  PATIENT DISPOSITION:  PACU - hemodynamically stable.

## 2018-05-31 ENCOUNTER — Encounter (HOSPITAL_COMMUNITY): Payer: Self-pay | Admitting: Orthopedic Surgery

## 2018-05-31 MED FILL — Thrombin (Recombinant) For Soln 20000 Unit: CUTANEOUS | Qty: 1 | Status: AC

## 2018-06-13 ENCOUNTER — Telehealth: Payer: Self-pay | Admitting: Nurse Practitioner

## 2018-06-13 NOTE — Telephone Encounter (Signed)
Please ask if any actual pain with walking that resolves with rest. Any discoloration? Moving leg ok?  This is probably related to his back. I think he should start with Dr. Joylene Draft first. There is really no elective testing at this time - may start picking back up in the next week or so.

## 2018-06-13 NOTE — Telephone Encounter (Signed)
New Message   Pts wife is calling because the pt is having some pain in his left leg, loosing strength in it. Numb and goes to sleep  She is wondering what they should do    Please call

## 2018-06-13 NOTE — Telephone Encounter (Signed)
S/w pt's wife pt had emergent back surgery two weeks ago.  Has been back to Dr. Rolena Infante office twice. Pt has a MRI on Tuesday.  Pt has no CP.  Pt's wife wanted reassurance.  Stated you will have to speak with Dr. Rolena Infante and wait for pt to get MRI results. From a cardiac standpoint pt was not having and issues with heart.  Will send to [facility] to [place]

## 2018-06-13 NOTE — Telephone Encounter (Signed)
Did not know about the recent back surgery in the original phone note - I suspect this is the cause - would follow up with Dr. Rolena Infante.

## 2018-06-14 DIAGNOSIS — M5136 Other intervertebral disc degeneration, lumbar region: Secondary | ICD-10-CM | POA: Diagnosis not present

## 2018-06-19 ENCOUNTER — Ambulatory Visit (HOSPITAL_COMMUNITY)
Admission: RE | Admit: 2018-06-19 | Discharge: 2018-06-19 | Disposition: A | Discharge status: Home / Self Care | Payer: PPO | Source: Ambulatory Visit | Attending: Orthopedic Surgery | Admitting: Orthopedic Surgery

## 2018-06-19 ENCOUNTER — Other Ambulatory Visit: Payer: Self-pay

## 2018-06-19 ENCOUNTER — Other Ambulatory Visit (HOSPITAL_COMMUNITY): Payer: Self-pay | Admitting: Orthopedic Surgery

## 2018-06-19 DIAGNOSIS — M79604 Pain in right leg: Secondary | ICD-10-CM | POA: Diagnosis not present

## 2018-06-19 DIAGNOSIS — M79605 Pain in left leg: Principal | ICD-10-CM

## 2018-06-19 DIAGNOSIS — M79661 Pain in right lower leg: Secondary | ICD-10-CM | POA: Diagnosis not present

## 2018-06-19 DIAGNOSIS — M79662 Pain in left lower leg: Secondary | ICD-10-CM | POA: Diagnosis not present

## 2018-06-19 DIAGNOSIS — Z4889 Encounter for other specified surgical aftercare: Secondary | ICD-10-CM | POA: Diagnosis not present

## 2018-06-20 ENCOUNTER — Ambulatory Visit: Payer: PPO | Admitting: Podiatrist

## 2018-07-04 ENCOUNTER — Ambulatory Visit: Payer: PPO | Admitting: Nurse Practitioner

## 2018-07-05 DIAGNOSIS — M79605 Pain in left leg: Secondary | ICD-10-CM | POA: Diagnosis not present

## 2018-07-05 DIAGNOSIS — M21372 Foot drop, left foot: Secondary | ICD-10-CM | POA: Diagnosis not present

## 2018-07-05 DIAGNOSIS — Z4789 Encounter for other orthopedic aftercare: Secondary | ICD-10-CM | POA: Diagnosis not present

## 2018-07-06 DIAGNOSIS — M21372 Foot drop, left foot: Secondary | ICD-10-CM | POA: Insufficient documentation

## 2018-07-06 DIAGNOSIS — M5416 Radiculopathy, lumbar region: Secondary | ICD-10-CM | POA: Diagnosis not present

## 2018-07-06 DIAGNOSIS — R7989 Other specified abnormal findings of blood chemistry: Secondary | ICD-10-CM | POA: Diagnosis not present

## 2018-07-06 DIAGNOSIS — M79609 Pain in unspecified limb: Secondary | ICD-10-CM | POA: Insufficient documentation

## 2018-07-06 DIAGNOSIS — M79675 Pain in left toe(s): Secondary | ICD-10-CM | POA: Diagnosis not present

## 2018-07-11 ENCOUNTER — Telehealth: Payer: Self-pay | Admitting: *Deleted

## 2018-07-11 ENCOUNTER — Other Ambulatory Visit: Payer: Self-pay | Admitting: *Deleted

## 2018-07-11 NOTE — Patient Outreach (Signed)
Hunter Mcbride) Care Management  07/11/2018  Hunter Mcbride. 1946/01/02 [id number]   Subjective: Telephone call to patient's home number, spoke with patient, and HIPAA verified.  Discussed [facility] Care Management [employer] Nurse Call Line follow up, patient voiced understanding, and is in agreement to follow up.  Patient states he is doing well, called into the nurse line to verified the provider [facility]) is a in- network provider for upcoming back surgery on 07/23/2018.  States [employer] service was office was closed on 07/08/2018, did not reopen until 07/10/2018, wife called on 07/10/2018, she spoke with Hunter Mcbride at [facility], and received answered to their questions.  Patient states he does not have any education material, nurse call line, care coordination, disease management, disease monitoring, transportation, community resource, or pharmacy needs at this time.   States he is very appreciative of the follow up.       Objective: Per KPN (Knowledge Performance Now, point of care tool) and chart review, patient has had no recent hospitalizations or ED visits.   Patient also has a history of CAD, diabetes, hypertension, hyperlipidemia, and lumber degeneration.     Assessment: Received [employer] Nurse Call Line follow up referral on 07/10/2018.  Referral reason: Caller declined to give contact information,caller requesting information regarding in-network benefits for [place] provider, and  was transferred to Hunter Mcbride.  Nurse line follow up completed and no further care management needs.       Plan: RNCM will complete case closure due to follow up completed / no care management needs.      Terea Neubauer H. Annia Friendly, BSN, [place] Management [facility] Telephonic CM Phone: [phone] Fax: [phone]

## 2018-07-11 NOTE — Telephone Encounter (Signed)
Virtual Visit Pre-Appointment Phone Call  "(Name), I am calling you today to discuss your upcoming appointment. We are currently trying to limit exposure to the virus that causes COVID-19 by seeing patients at home rather than in the office."  1. "What is the BEST phone number to call the day of the visit?" - include this in appointment notes  2. "Do you have or have access to (through a family member/friend) a smartphone with video capability that we can use for your visit?" a. If yes - list this number in appt notes as "cell" (if different from BEST phone #) and list the appointment type as a VIDEO visit in appointment notes b. If no - list the appointment type as a PHONE visit in appointment notes  3. Confirm consent - "In the setting of the current Covid19 crisis, you are scheduled for a (phone or video) visit with your provider on (Thursday, May 28) at (11:00 am).  Just as we do with many in-office visits, in order for you to participate in this visit, we must obtain consent.  If you'd like, I can send this to your mychart (if signed up) or email for you to review.  Otherwise, I can obtain your verbal consent now.  All virtual visits are billed to your insurance company just like a normal visit would be.  By agreeing to a virtual visit, we'd like you to understand that the technology does not allow for your provider to perform an examination, and thus may limit your provider's ability to fully assess your condition. If your provider identifies any concerns that need to be evaluated in person, we will make arrangements to do so.  Finally, though the technology is pretty good, we cannot assure that it will always work on either your or our end, and in the setting of a video visit, we may have to convert it to a phone-only visit.  In either situation, we cannot ensure that we have a secure connection.  Are you willing to proceed?" STAFF: Did the patient verbally acknowledge consent to telehealth  visit? Document YES/NO here: YES  4. Advise patient to be prepared - "Two hours prior to your appointment, go ahead and check your blood pressure, pulse, oxygen saturation, and your weight (if you have the equipment to check those) and write them all down. When your visit starts, your provider will ask you for this information. If you have an Apple Watch or Kardia device, please plan to have heart rate information ready on the day of your appointment. Please have a pen and paper handy nearby the day of the visit as well."  5. Give patient instructions for MyChart download to smartphone OR Doximity/Doxy.me as below if video visit (depending on what platform provider is using)  6. Inform patient they will receive a phone call 15 minutes prior to their appointment time (may be from unknown caller ID) so they should be prepared to answer    [name] has been deemed a candidate for a follow-up tele-health visit to limit community exposure during the Covid-19 pandemic. I spoke with the patient via phone to ensure availability of phone/video source, confirm preferred email & phone number, and discuss instructions and expectations.  I reminded Hunter Mcbride. to be prepared with any vital sign and/or heart rhythm information that could potentially be obtained via home monitoring, at the time of his visit. I reminded Hunter Mcbride. to  expect a phone call prior to his visit.  Hunter Mcbride Hunter Mcbride 07/11/2018 10:45 AM   INSTRUCTIONS FOR DOWNLOADING THE MYCHART APP TO SMARTPHONE  - The patient must first make sure to have activated MyChart and know their login information - If Apple, go to [employer] and type in MyChart in the search bar and download the app. If Android, ask patient to go to [employer] and type in [place] in the search bar and download the app. The app is free but as with any other app downloads, their phone may require them to verify saved payment  information or Apple/Android password.  - The patient will need to then log into the app with their MyChart username and password, and select Hunter Mcbride as their healthcare provider to link the account. When it is time for your visit, go to the MyChart app, find appointments, and click Begin Video Visit. Be sure to Select Allow for your device to access the Microphone and Camera for your visit. You will then be connected, and your provider will be with you shortly.  **If they have any issues connecting, or need assistance please contact MyChart service desk [phone])**  **If using a computer, in order to ensure the best quality for their visit they will need to use either of the following Internet Browsers: [employer], or Google Chrome**  IF USING DOXIMITY or DOXY.ME - The patient will receive a link just prior to their visit by text.     FULL LENGTH CONSENT FOR TELE-HEALTH VISIT   I hereby voluntarily request, consent and authorize [place] and its employed or contracted physicians, physician assistants, nurse practitioners or other licensed health care professionals (the Practitioner), to provide me with telemedicine health care services (the "Services") as deemed necessary by the treating Practitioner. I acknowledge and consent to receive the Services by the Practitioner via telemedicine. I understand that the telemedicine visit will involve communicating with the Practitioner through live audiovisual communication technology and the disclosure of certain medical information by electronic transmission. I acknowledge that I have been given the opportunity to request an in-person assessment or other available alternative prior to the telemedicine visit and am voluntarily participating in the telemedicine visit.  I understand that I have the right to withhold or withdraw my consent to the use of telemedicine in the course of my care at any time, without affecting my right  to future care or treatment, and that the Practitioner or I may terminate the telemedicine visit at any time. I understand that I have the right to inspect all information obtained and/or recorded in the course of the telemedicine visit and may receive copies of available information for a reasonable fee.  I understand that some of the potential risks of receiving the Services via telemedicine include:  [name] Delay or interruption in medical evaluation due to technological equipment failure or disruption; . Information transmitted may not be sufficient (e.g. poor resolution of images) to allow for appropriate medical decision making by the Practitioner; and/or  . In rare instances, security protocols could fail, causing a breach of personal health information.  Furthermore, I acknowledge that it is my responsibility to provide information about my medical history, conditions and care that is complete and accurate to the best of my ability. I acknowledge that Practitioner's advice, recommendations, and/or decision may be based on factors not within their control, such as incomplete or inaccurate data provided by me or distortions of diagnostic images or specimens that  may result from electronic transmissions. I understand that the practice of medicine is not an exact science and that Practitioner makes no warranties or guarantees regarding treatment outcomes. I acknowledge that I will receive a copy of this consent concurrently upon execution via email to the email address I last provided but may also request a printed copy by calling the office of [facility].    I understand that my insurance will be billed for this visit.   I have read or had this consent read to me. . I understand the contents of this consent, which adequately explains the benefits and risks of the Services being provided via telemedicine.  . I have been provided ample opportunity to ask questions regarding this consent and the Services  and have had my questions answered to my satisfaction. . I give my informed consent for the services to be provided through the use of telemedicine in my medical care  By participating in this telemedicine visit I agree to the above.

## 2018-07-11 NOTE — Progress Notes (Signed)
Telehealth Visit     Virtual Visit via Video Note   This visit type was conducted due to national recommendations for restrictions regarding the COVID-19 Pandemic (e.g. social distancing) in an effort to limit this patient's exposure and mitigate transmission in our community.  Due to his co-morbid illnesses, this patient is at least at moderate risk for complications without adequate follow up.  This format is felt to be most appropriate for this patient at this time.  All issues noted in this document were discussed and addressed.  A limited physical exam was performed with this format.  Please refer to the patient's chart for his consent to telehealth for [facility].   Evaluation Performed:  Follow-up visit  This visit type was conducted due to national recommendations for restrictions regarding the COVID-19 Pandemic (e.g. social distancing).  This format is felt to be most appropriate for this patient at this time.  All issues noted in this document were discussed and addressed.  No physical exam was performed (except for noted visual exam findings with Video Visits).  Please refer to the patient's chart (MyChart message for video visits and phone note for telephone visits) for the patient's consent to telehealth for [facility].  Date:  07/12/2018   ID:  Hunter Mcbride., DOB 1945/03/17, MRN [id number]  Patient Location:  Home  Provider location:   Home  PCP:  Crist Infante, MD  Cardiologist:  Servando Snare Sherren Mocha, MD  Electrophysiologist:  None   Chief Complaint:  Follow up  History of Present Illness:    Hunter Mcbride. is a 73 y.o. male who presents via audio/video conferencing for a telehealth visit today.  Seen for Dr. Burt Knack & Nahser. Primarily sees me.  He has along standinghistory ofCAD, HTN, HLD, DM and OSA. He has had prior DES x 3 to the LAD back in 2015. Several caths since. Ended up being referred for CABG x 5 in September of 2018 by [facility]. He  isIntolerant to higher doses of nitrates despite multiple attempts. Hadnot wanted to increase Ranexa due to cost. Has had chronic tendency to really overexert himself. It wassurprising how very well his exercise tolerance is and that his angina is typically always at rest - this is chronic. He has had prior echo showing mild dilatation of the aortawith repeat studieswasnot identified.  He has had repeat cath since his CABG - to continue with medical management.   Last seen in November and was felt to be stable. He has had to have emergent back surgery earlier this year due to low back, buttock, radicular left leg pain - failed on conservative treatment and had left L5 foraminotomy.   The patient does not have symptoms concerning for COVID-19 infection (fever, chills, cough, or new shortness of breath).   Seen today via Doximity video. He has consented for this visit. His wife is present as well. There are plans for a lumber fusion next month (June 8) at [facility] with Dr. Prince Rome - "as a result of the Gill decompression causing an iatrogenic instability resulting in nerve compression".  He is on chronic narcotics now along with Neurontin. Seeing anesthesia next week - asking about aspirin and Plavix. At his last surgery his Plavix was held solely for 5 days. He has no chest pain. He is more limited by his back pain and leg pain. He actually has drop foot now per his report. He has had some falls - due to the left foot  drop/dragging. Otherwise he feels fine. He is limited in his ability to walk due to pain. He is still able to hunt. He is able to mow. He is staying busy.    Past Medical History:  Diagnosis Date  . Arthritis    "lower back; right shoulder; left thumb; joints" (06/21/2013)  . Asthma    "not sure if this is true or not" (06/21/2013)  . CAD (coronary artery disease) 06/2013   s/p PTCA/DES x 3 to mid LAD 2015. b. s/p CABG in 2018. c. cath 12/2017 -> med rx.  [name] GERD  (gastroesophageal reflux disease)   . Gout    "maybe twice in my life"  . Headache(784.0)    "weekly for the last 3-4 months" (06/21/2013)  . Hyperlipidemia   . Hypertension   . Myocardial infarction [facility])    "/Dr. Katharina Caper I've had one between 2014-2015) (06/21/2013)  . Osteoarthritis   . Sleep apnea    WEIGHT LOSS, NO LONGER NEEDS PER PATIENT  . Type II diabetes mellitus ([place])    TYPE 2   Past Surgical History:  Procedure Laterality Date  . CARDIAC CATHETERIZATION  1980's   "once"  . CARDIAC CATHETERIZATION N/A 07/23/2015   Procedure: Left Heart Cath and Coronary Angiography;  Surgeon: Sherren Mocha, MD;  Location: [place] CV LAB;  Service: Cardiovascular;  Laterality: N/A;  . CARDIOVASCULAR STRESS TEST  06/10/2008   EF 68%  . CARPAL TUNNEL RELEASE Bilateral   . CORONARY ANGIOPLASTY WITH STENT PLACEMENT  06/21/2013   "3"  . CORONARY ARTERY BYPASS GRAFT N/A 11/07/2016   Procedure: CORONARY ARTERY BYPASS GRAFTING (CABG) x 5 (LIMA to DISTAL LAD, SVG to DIAGONAL, SVG to CIRCUMFLEX, and SVG SEQUENTIALLY to PLB and DISTAL PDA) with EVH of the RIGHT GREATER SAPHENOUS VEIN and LEFT INTERNAL MAMMARY ARTERY HARVEST;  Surgeon: Grace Isaac, MD;  Location: [place];  Service: Open Heart Surgery;  Laterality: N/A;  . CORONARY STENT INTERVENTION N/A 06/07/2016   Procedure: Coronary Stent Intervention;  Surgeon: Burnell Blanks, MD;  Location: [place] CV LAB;  Service: Cardiovascular;  Laterality: N/A;  . EXCISION MORTON'S NEUROMA Left   . EYE SURGERY Left    "removed film over my eye"  . FRACTIONAL FLOW RESERVE WIRE N/A 06/26/2013   Procedure: FRACTIONAL FLOW RESERVE WIRE;  Surgeon: [name], MD;  Location: [place] CATH LAB;  Service: Cardiovascular;  Laterality: N/A;  . HERNIA REPAIR Left   . INTRAVASCULAR PRESSURE WIRE/FFR STUDY N/A 10/28/2016   Procedure: INTRAVASCULAR PRESSURE WIRE/FFR STUDY;  Surgeon: Leonie Man, MD;  Location: [place] CV LAB;  Service: Cardiovascular;   Laterality: N/A;  . JOINT REPLACEMENT Left 03/2013   "thumb"  . LEFT HEART CATH AND CORONARY ANGIOGRAPHY N/A 06/07/2016   Procedure: Left Heart Cath and Coronary Angiography;  Surgeon: Burnell Blanks, MD;  Location: [place] CV LAB;  Service: Cardiovascular;  Laterality: N/A;  . LEFT HEART CATH AND CORONARY ANGIOGRAPHY N/A 10/28/2016   Procedure: LEFT HEART CATH AND CORONARY ANGIOGRAPHY;  Surgeon: Leonie Man, MD;  Location: [place] CV LAB;  Service: Cardiovascular;  Laterality: N/A;  . LEFT HEART CATH AND CORS/GRAFTS ANGIOGRAPHY N/A 12/25/2017   Procedure: LEFT HEART CATH AND CORS/GRAFTS ANGIOGRAPHY;  Surgeon: Sherren Mocha, MD;  Location: [place] CV LAB;  Service: Cardiovascular;  Laterality: N/A;  . LEFT HEART CATHETERIZATION WITH CORONARY ANGIOGRAM N/A 06/21/2013   Procedure: LEFT HEART CATHETERIZATION WITH CORONARY ANGIOGRAM;  Surgeon: Burnell Blanks, MD;  Location:  [place] CATH LAB;  Service: Cardiovascular;  Laterality: N/A;  . LEFT HEART CATHETERIZATION WITH CORONARY ANGIOGRAM N/A 06/26/2013   Procedure: LEFT HEART CATHETERIZATION WITH CORONARY ANGIOGRAM;  Surgeon: [name], MD;  Location: [place] CATH LAB;  Service: Cardiovascular;  Laterality: N/A;  . LUMBAR LAMINECTOMY/DECOMPRESSION MICRODISCECTOMY Left 05/30/2018   Procedure: Left  Gill L5 decompression;  Surgeon: Melina Schools, MD;  Location: [place];  Service: Orthopedics;  Laterality: Left;  157min  . TEE WITHOUT CARDIOVERSION N/A 11/07/2016   Procedure: TRANSESOPHAGEAL ECHOCARDIOGRAM (TEE);  Surgeon: Grace Isaac, MD;  Location: [place];  Service: Open Heart Surgery;  Laterality: N/A;  . TONSILLECTOMY    . TOTAL HIP ARTHROPLASTY Right 04/12/2016   Procedure: RIGHT TOTAL HIP ARTHROPLASTY ANTERIOR APPROACH;  Surgeon: Paralee Cancel, MD;  Location: [facility];  Service: Orthopedics;  Laterality: Right;  requests 70 mins  . TOTAL SHOULDER ARTHROPLASTY  03/01/2012   Procedure: TOTAL SHOULDER ARTHROPLASTY;  Surgeon:  Marin Shutter, MD;  Location: [place];  Service: Orthopedics;  Laterality: Right;  . UMBILICAL HERNIA REPAIR       Current Meds  Medication Sig  . acetaminophen (TYLENOL) 500 MG tablet Take 1,000 mg by mouth every 8 (eight) hours as needed (pain).   [name] ALPRAZolam (XANAX) 0.5 MG tablet Take 0.25 mg by mouth as needed for anxiety or sleep.  [name] amLODipine (NORVASC) 5 MG tablet TAKE ONE TABLET BY MOUTH TWICE DAILY  (Patient taking differently: Take 5 mg by mouth 2 (two) times daily. )  . aspirin EC 81 MG tablet Take 81 mg by mouth daily.  [name] atenolol (TENORMIN) 50 MG tablet Take 1 tablet (50 mg total) by mouth at bedtime.  [name] atorvastatin (LIPITOR) 80 MG tablet Take 80 mg by mouth daily at 6 PM.  . canagliflozin (INVOKANA) 300 MG TABS tablet Take 300 mg by mouth daily before breakfast.  . Cholecalciferol (VITAMIN D) 50 MCG (2000 UT) tablet Take 2,000 Units by mouth daily.  . clopidogrel (PLAVIX) 75 MG tablet Take 75 mg by mouth daily.  . colchicine 0.6 MG tablet Take 0.6 mg by mouth daily.   [name] docusate sodium (COLACE) 100 MG capsule Take 100 mg by mouth daily as needed for mild constipation.  [name] FREESTYLE TEST STRIPS test strip   . gabapentin (NEURONTIN) 300 MG capsule Take 300 mg by mouth 2 (two) times daily.   . hydrochlorothiazide (HYDRODIURIL) 25 MG tablet Take 25 mg by mouth daily.  [name] HYDROcodone-acetaminophen (NORCO) 10-325 MG tablet hydrocodone 10 mg-acetaminophen 325 mg tablet  TAKE 1 TABLETS BY MOUTH 4 TIMES A DAY WITH MEALS FOR 14 DAYS.  . Insulin Glargine, 1 Unit Dial, (TOUJEO SOLOSTAR) 300 UNIT/ML SOPN Inject 18 Units into the skin daily.  . irbesartan (AVAPRO) 300 MG tablet Take 300 mg by mouth daily.  . metFORMIN (GLUCOPHAGE-XR) 500 MG 24 hr tablet Take 500 mg by mouth 2 (two) times daily.  . methocarbamol (ROBAXIN) 500 MG tablet Take 500 mg by mouth every 8 (eight) hours as needed for muscle spasms (back pain).   . Multiple Vitamin (MULTIVITAMIN) tablet Take 1 tablet by mouth daily.   . nitroGLYCERIN (NITROSTAT) 0.4 MG SL tablet Place 1 tablet (0.4 mg total) under the tongue every 5 (five) minutes as needed for chest pain.  [name] ondansetron (ZOFRAN) 4 MG tablet Take 1 tablet (4 mg total) by mouth every 8 (eight) hours as needed for nausea or vomiting.  . pantoprazole (PROTONIX) 40 MG tablet Take 1 tablet (40 mg total)  by mouth 2 (two) times daily before a meal.  . polyethylene glycol (MIRALAX / GLYCOLAX) packet Take 17 g by mouth daily as needed for mild constipation.  . promethazine (PHENERGAN) 12.5 MG tablet Take 12.5 mg by mouth every 6 (six) hours as needed for nausea or vomiting.  . [DISCONTINUED] ALPRAZolam (XANAX) 0.5 MG tablet Take 0.5 tablets (0.25 mg total) by mouth daily as needed for anxiety. (Patient taking differently: Take 0.5 mg by mouth 2 (two) times daily as needed for anxiety. )     Allergies:   Oxycodone   Social History   Tobacco Use  . Smoking status: Never Smoker  . Smokeless tobacco: Never Used  Substance Use Topics  . Alcohol use: No  . Drug use: No     Family Hx: The patient's family history includes Coronary artery disease in his cousin; Diabetes type II in his mother; Heart attack in his father; Heart disease in his brother.  ROS:   Please see the history of present illness.   All other systems reviewed are negative except for back/leg pain.    Objective:    Vital Signs:  BP 114/61   Pulse 61   Ht 5\' 9"  (1.753 m)   Wt 193 lb (87.5 kg)   BMI 28.50 kg/m    Wt Readings from Last 3 Encounters:  07/12/18 193 lb (87.5 kg)  05/30/18 201 lb (91.2 kg)  05/25/18 201 lb (91.2 kg)    Alert male in no acute distress. Not short of breath with conversation.    Labs/Other Tests and Data Reviewed:    Lab Results  Component Value Date   WBC 4.3 05/25/2018   HGB 12.9 (L) 05/30/2018   HCT 38.0 (L) 05/30/2018   PLT 142 (L) 05/25/2018   GLUCOSE 106 (H) 05/30/2018   CHOL 90 12/25/2017   TRIG 89 12/25/2017   HDL 36 (L) 12/25/2017    LDLCALC 36 12/25/2017   ALT 38 12/25/2017   AST 29 12/25/2017   NA 140 05/30/2018   K 4.0 05/30/2018   CL 103 05/25/2018   CREATININE 1.02 05/25/2018   BUN 25 (H) 05/25/2018   CO2 28 05/25/2018   TSH 2.009 06/06/2016   INR 1.23 12/25/2017   HGBA1C 7.9 (H) 05/25/2018     BNP (last 3 results) Recent Labs    12/25/17 1553  BNP 32.2    ProBNP (last 3 results) No results for input(s): PROBNP in the last 8760 hours.    Prior CV studies:    The following studies were reviewed today:  LEFT HEART CATH AND CORS/GRAFTS ANGIOGRAPHY 12/2017  Conclusion     Ost LM lesion is 40% stenosed.  Dist LAD lesion is 40% stenosed.  1st Diag lesion is 80% stenosed.  2nd Diag lesion is 70% stenosed.  Prox LAD to Dist LAD lesion is 50% stenosed.  Mid Cx lesion is 60% stenosed.  1st Mrg lesion is 75% stenosed.  Non-stenotic Dist RCA lesion was previously treated.  Prox RCA to Mid RCA lesion is 35% stenosed.  Mid RCA lesion is 40% stenosed.  Ost RPDA to RPDA lesion is 40% stenosed.  RPDA lesion is 85% stenosed.  SVG graft was visualized by angiography and is normal in caliber.  The graft exhibits no disease.  LIMA.  SVG.  Seq SVG-.  1. Patent left mainstem 2. Patent LAD with mild diffuse stenosis, severe diagonal stenosis with a patent graft 3. Severe first obtuse marginal stenosis with a patent graft  4. Patent  RCA with mild nonobstructive disease and severe PDA stenosis with patent graft to the PDA and PLA branches 5. Atretic LIMA to LAD, LAD supplied via antegrade native vessel flow as well as retrograde flow from the diagonal branch saphenous vein graft. 6. Normal LVEDP  Recommend ongoing medical therapy. If the patient is chest pain-free this evening, hospital discharge would be reasonable. If recurrent chest pain would observe overnight with serial cardiac markers.      SURGICAL PROCEDURE9/2018:  Coronary artery bypass grafting x5 with the  left internal mammary to the distal left anterior descending coronary artery, reverse saphenous vein graft to the diagonal, reverse saphenous vein graft to the first obtuse marginal, sequential reverse saphenous vein graft to the proximal posterior lateral branch and to the distal posterior descending with right leg, thigh, and calf greater saphenous vein harvesting endoscopically.    ASSESSMENT & PLAN:    1. CAD - with prior DES x 3 to the LAD, prior DES x 1 to proximal PLA and balloon PCI to proximal PDA - subsequent X CABG x 5 with LIMA to distal LAD, SVG to DX, SVG to 1st OM & sequential SVG to proximal posterior lateral and distal PD in 10/2016. Most recent cath for chest pain in November of 2019 with stable findings - he has no active symptoms - despite his back/leg pain he remains very active and able to complete over 4 mets of activity.   2. HTN - BP is fine on his current regimen. No changes made today.   3. HLD - on statin therapy  4. DM - per PCP  5. Back/leg pain - with plans for upcoming fusion - we held Plavix solely for 5 days for his prior surgery - will discuss with Dr. Burt Knack.   6. COVID-19 Education: The signs and symptoms of COVID-19 were discussed with the patient and how to seek care for testing (follow up with PCP or arrange E-visit).  The importance of social distancing, staying at home, hand hygiene and wearing a mask when out in public were discussed today.  Patient Risk:   After full review of this patient's clinical status, I feel that they are at least moderate risk at this time.  Time:   Today, I have spent 14 minutes with the patient with telehealth technology discussing the above issues.     Medication Adjustments/Labs and Tests Ordered: Current medicines are reviewed at length with the patient today.  Concerns regarding medicines are outlined above.   Tests Ordered: No orders of the defined types were placed in this encounter.   Medication  Changes: No orders of the defined types were placed in this encounter.   Disposition:  FU with me in about 6 months.   Patient is agreeable to this plan and will call if any problems develop in the interim.   Amie Critchley, NP  07/12/2018 11:21 AM    [place]

## 2018-07-12 ENCOUNTER — Telehealth (INDEPENDENT_AMBULATORY_CARE_PROVIDER_SITE_OTHER): Payer: PPO | Admitting: Nurse Practitioner

## 2018-07-12 ENCOUNTER — Other Ambulatory Visit: Payer: Self-pay

## 2018-07-12 ENCOUNTER — Encounter: Payer: Self-pay | Admitting: Nurse Practitioner

## 2018-07-12 VITALS — BP 114/61 | HR 61 | Ht 69.0 in | Wt 193.0 lb

## 2018-07-12 DIAGNOSIS — E785 Hyperlipidemia, unspecified: Secondary | ICD-10-CM

## 2018-07-12 DIAGNOSIS — I259 Chronic ischemic heart disease, unspecified: Secondary | ICD-10-CM

## 2018-07-12 DIAGNOSIS — I10 Essential (primary) hypertension: Secondary | ICD-10-CM

## 2018-07-12 DIAGNOSIS — Z951 Presence of aortocoronary bypass graft: Secondary | ICD-10-CM

## 2018-07-12 DIAGNOSIS — Z7189 Other specified counseling: Secondary | ICD-10-CM

## 2018-07-12 NOTE — Patient Instructions (Addendum)
After Visit Summary:  We will be checking the following labs today - None   Medication Instructions:    Continue with your current medicines.   I will talk to Dr. Burt Knack about holding your aspirin/Plavix for upcoming surgery.    If you need a refill on your cardiac medications before your next appointment, please call your pharmacy.     Testing/Procedures To Be Arranged:  N/A  Follow-Up:   See me in about 6 months    At [facility], you and your health needs are our priority.  As part of our continuing mission to provide you with exceptional heart care, we have created designated Provider Care Teams.  These Care Teams include your primary Cardiologist (physician) and Advanced Practice Providers (APPs -  Physician Assistants and Nurse Practitioners) who all work together to provide you with the care you need, when you need it.  Special Instructions:  . Stay safe, stay home, wash your hands for at least 20 seconds and wear a mask when out in public.  . It was good to talk with you today.    Call the [place] office at [phone] if you have any questions, problems or concerns.

## 2018-07-12 NOTE — Progress Notes (Signed)
thx [name]. Yes he should be fine to hold plavix x 5 days for the procedure. He's at low risk of complication. thx!

## 2018-07-13 NOTE — Progress Notes (Signed)
S/w pt is aware to hold plavix 5 days prior to upcoming surgery and restart per surgeon.  Pt needs to stay on ASA.

## 2018-07-13 NOTE — Progress Notes (Signed)
Please let Mr. Coppolino know that Dr. Burt Knack has recommended holding Plavix only for 5 days for upcoming surgery - resume as soon as possible at direction of the surgeon.

## 2018-07-18 DIAGNOSIS — Z01812 Encounter for preprocedural laboratory examination: Secondary | ICD-10-CM | POA: Diagnosis not present

## 2018-07-18 DIAGNOSIS — I10 Essential (primary) hypertension: Secondary | ICD-10-CM | POA: Diagnosis not present

## 2018-07-18 DIAGNOSIS — Z03818 Encounter for observation for suspected exposure to other biological agents ruled out: Secondary | ICD-10-CM | POA: Diagnosis not present

## 2018-07-18 DIAGNOSIS — Z1159 Encounter for screening for other viral diseases: Secondary | ICD-10-CM | POA: Diagnosis not present

## 2018-07-18 DIAGNOSIS — M21372 Foot drop, left foot: Secondary | ICD-10-CM | POA: Diagnosis not present

## 2018-07-18 DIAGNOSIS — M532X7 Spinal instabilities, lumbosacral region: Secondary | ICD-10-CM | POA: Diagnosis not present

## 2018-07-18 DIAGNOSIS — Z01818 Encounter for other preprocedural examination: Secondary | ICD-10-CM | POA: Diagnosis not present

## 2018-07-18 DIAGNOSIS — M5417 Radiculopathy, lumbosacral region: Secondary | ICD-10-CM | POA: Diagnosis not present

## 2018-07-25 DIAGNOSIS — M4807 Spinal stenosis, lumbosacral region: Secondary | ICD-10-CM | POA: Diagnosis not present

## 2018-07-25 DIAGNOSIS — M4317 Spondylolisthesis, lumbosacral region: Secondary | ICD-10-CM | POA: Diagnosis not present

## 2018-07-25 DIAGNOSIS — M4316 Spondylolisthesis, lumbar region: Secondary | ICD-10-CM | POA: Diagnosis not present

## 2018-07-25 DIAGNOSIS — M545 Low back pain: Secondary | ICD-10-CM | POA: Diagnosis not present

## 2018-07-25 DIAGNOSIS — M532X7 Spinal instabilities, lumbosacral region: Secondary | ICD-10-CM | POA: Diagnosis not present

## 2018-07-25 DIAGNOSIS — Z981 Arthrodesis status: Secondary | ICD-10-CM | POA: Diagnosis not present

## 2018-07-25 DIAGNOSIS — M5416 Radiculopathy, lumbar region: Secondary | ICD-10-CM | POA: Diagnosis not present

## 2018-07-25 DIAGNOSIS — M5417 Radiculopathy, lumbosacral region: Secondary | ICD-10-CM | POA: Diagnosis not present

## 2018-07-26 DIAGNOSIS — Z981 Arthrodesis status: Secondary | ICD-10-CM | POA: Insufficient documentation

## 2018-08-01 ENCOUNTER — Other Ambulatory Visit: Payer: Self-pay | Admitting: Nurse Practitioner

## 2018-08-02 DIAGNOSIS — Z981 Arthrodesis status: Secondary | ICD-10-CM | POA: Diagnosis not present

## 2018-08-02 DIAGNOSIS — Z9889 Other specified postprocedural states: Secondary | ICD-10-CM | POA: Diagnosis not present

## 2018-08-10 DIAGNOSIS — M549 Dorsalgia, unspecified: Secondary | ICD-10-CM | POA: Diagnosis not present

## 2018-08-10 DIAGNOSIS — Z981 Arthrodesis status: Secondary | ICD-10-CM | POA: Diagnosis not present

## 2018-08-27 DIAGNOSIS — I2510 Atherosclerotic heart disease of native coronary artery without angina pectoris: Secondary | ICD-10-CM | POA: Diagnosis not present

## 2018-08-27 DIAGNOSIS — Z1159 Encounter for screening for other viral diseases: Secondary | ICD-10-CM | POA: Diagnosis not present

## 2018-08-27 DIAGNOSIS — Z955 Presence of coronary angioplasty implant and graft: Secondary | ICD-10-CM | POA: Diagnosis not present

## 2018-08-27 DIAGNOSIS — E7800 Pure hypercholesterolemia, unspecified: Secondary | ICD-10-CM | POA: Diagnosis not present

## 2018-08-27 DIAGNOSIS — B9689 Other specified bacterial agents as the cause of diseases classified elsewhere: Secondary | ICD-10-CM | POA: Diagnosis not present

## 2018-08-27 DIAGNOSIS — R030 Elevated blood-pressure reading, without diagnosis of hypertension: Secondary | ICD-10-CM | POA: Diagnosis not present

## 2018-08-27 DIAGNOSIS — T8463XA Infection and inflammatory reaction due to internal fixation device of spine, initial encounter: Secondary | ICD-10-CM | POA: Diagnosis not present

## 2018-08-27 DIAGNOSIS — T8141XA Infection following a procedure, superficial incisional surgical site, initial encounter: Secondary | ICD-10-CM | POA: Diagnosis not present

## 2018-08-27 DIAGNOSIS — Z981 Arthrodesis status: Secondary | ICD-10-CM | POA: Diagnosis not present

## 2018-08-27 DIAGNOSIS — K219 Gastro-esophageal reflux disease without esophagitis: Secondary | ICD-10-CM | POA: Diagnosis not present

## 2018-08-27 DIAGNOSIS — E119 Type 2 diabetes mellitus without complications: Secondary | ICD-10-CM | POA: Diagnosis not present

## 2018-08-27 DIAGNOSIS — Z951 Presence of aortocoronary bypass graft: Secondary | ICD-10-CM | POA: Diagnosis not present

## 2018-08-27 DIAGNOSIS — T8142XA Infection following a procedure, deep incisional surgical site, initial encounter: Secondary | ICD-10-CM | POA: Diagnosis not present

## 2018-08-28 DIAGNOSIS — T8141XA Infection following a procedure, superficial incisional surgical site, initial encounter: Secondary | ICD-10-CM | POA: Diagnosis not present

## 2018-08-28 DIAGNOSIS — Z981 Arthrodesis status: Secondary | ICD-10-CM | POA: Diagnosis not present

## 2018-08-28 DIAGNOSIS — B9689 Other specified bacterial agents as the cause of diseases classified elsewhere: Secondary | ICD-10-CM | POA: Diagnosis not present

## 2018-09-03 DIAGNOSIS — Z7689 Persons encountering health services in other specified circumstances: Secondary | ICD-10-CM | POA: Diagnosis not present

## 2018-09-04 DIAGNOSIS — L089 Local infection of the skin and subcutaneous tissue, unspecified: Secondary | ICD-10-CM | POA: Insufficient documentation

## 2018-09-10 DIAGNOSIS — Z7689 Persons encountering health services in other specified circumstances: Secondary | ICD-10-CM | POA: Diagnosis not present

## 2018-09-10 DIAGNOSIS — Z125 Encounter for screening for malignant neoplasm of prostate: Secondary | ICD-10-CM | POA: Diagnosis not present

## 2018-09-10 DIAGNOSIS — E119 Type 2 diabetes mellitus without complications: Secondary | ICD-10-CM | POA: Diagnosis not present

## 2018-09-10 DIAGNOSIS — E7849 Other hyperlipidemia: Secondary | ICD-10-CM | POA: Diagnosis not present

## 2018-09-11 DIAGNOSIS — R82998 Other abnormal findings in urine: Secondary | ICD-10-CM | POA: Diagnosis not present

## 2018-09-11 DIAGNOSIS — E1169 Type 2 diabetes mellitus with other specified complication: Secondary | ICD-10-CM | POA: Diagnosis not present

## 2018-09-13 DIAGNOSIS — Z0000 Encounter for general adult medical examination without abnormal findings: Secondary | ICD-10-CM | POA: Diagnosis not present

## 2018-09-13 DIAGNOSIS — Z969 Presence of functional implant, unspecified: Secondary | ICD-10-CM | POA: Diagnosis not present

## 2018-09-13 DIAGNOSIS — Z981 Arthrodesis status: Secondary | ICD-10-CM | POA: Diagnosis not present

## 2018-09-13 DIAGNOSIS — Z79899 Other long term (current) drug therapy: Secondary | ICD-10-CM | POA: Diagnosis not present

## 2018-09-13 DIAGNOSIS — E785 Hyperlipidemia, unspecified: Secondary | ICD-10-CM | POA: Diagnosis not present

## 2018-09-13 DIAGNOSIS — E1169 Type 2 diabetes mellitus with other specified complication: Secondary | ICD-10-CM | POA: Diagnosis not present

## 2018-09-13 DIAGNOSIS — F418 Other specified anxiety disorders: Secondary | ICD-10-CM | POA: Diagnosis not present

## 2018-09-13 DIAGNOSIS — M79675 Pain in left toe(s): Secondary | ICD-10-CM | POA: Diagnosis not present

## 2018-09-13 DIAGNOSIS — I10 Essential (primary) hypertension: Secondary | ICD-10-CM | POA: Diagnosis not present

## 2018-09-13 DIAGNOSIS — Z9889 Other specified postprocedural states: Secondary | ICD-10-CM | POA: Diagnosis not present

## 2018-09-13 DIAGNOSIS — B9689 Other specified bacterial agents as the cause of diseases classified elsewhere: Secondary | ICD-10-CM | POA: Diagnosis not present

## 2018-09-13 DIAGNOSIS — Z792 Long term (current) use of antibiotics: Secondary | ICD-10-CM | POA: Diagnosis not present

## 2018-09-13 DIAGNOSIS — R001 Bradycardia, unspecified: Secondary | ICD-10-CM | POA: Diagnosis not present

## 2018-09-13 DIAGNOSIS — D126 Benign neoplasm of colon, unspecified: Secondary | ICD-10-CM | POA: Diagnosis not present

## 2018-09-13 DIAGNOSIS — I2510 Atherosclerotic heart disease of native coronary artery without angina pectoris: Secondary | ICD-10-CM | POA: Diagnosis not present

## 2018-09-13 DIAGNOSIS — I209 Angina pectoris, unspecified: Secondary | ICD-10-CM | POA: Diagnosis not present

## 2018-09-13 DIAGNOSIS — M5416 Radiculopathy, lumbar region: Secondary | ICD-10-CM | POA: Diagnosis not present

## 2018-09-13 DIAGNOSIS — E669 Obesity, unspecified: Secondary | ICD-10-CM | POA: Diagnosis not present

## 2018-09-13 DIAGNOSIS — M21372 Foot drop, left foot: Secondary | ICD-10-CM | POA: Diagnosis not present

## 2018-09-13 DIAGNOSIS — J45909 Unspecified asthma, uncomplicated: Secondary | ICD-10-CM | POA: Diagnosis not present

## 2018-09-13 DIAGNOSIS — T8141XA Infection following a procedure, superficial incisional surgical site, initial encounter: Secondary | ICD-10-CM | POA: Diagnosis not present

## 2018-09-13 DIAGNOSIS — A498 Other bacterial infections of unspecified site: Secondary | ICD-10-CM | POA: Diagnosis not present

## 2018-09-13 DIAGNOSIS — M1990 Unspecified osteoarthritis, unspecified site: Secondary | ICD-10-CM | POA: Diagnosis not present

## 2018-09-13 DIAGNOSIS — T8149XA Infection following a procedure, other surgical site, initial encounter: Secondary | ICD-10-CM | POA: Diagnosis not present

## 2018-09-13 DIAGNOSIS — E119 Type 2 diabetes mellitus without complications: Secondary | ICD-10-CM | POA: Diagnosis not present

## 2018-09-13 DIAGNOSIS — Z1331 Encounter for screening for depression: Secondary | ICD-10-CM | POA: Diagnosis not present

## 2018-09-14 DIAGNOSIS — Z9889 Other specified postprocedural states: Secondary | ICD-10-CM | POA: Diagnosis not present

## 2018-09-14 DIAGNOSIS — Z981 Arthrodesis status: Secondary | ICD-10-CM | POA: Diagnosis not present

## 2018-09-17 DIAGNOSIS — E1169 Type 2 diabetes mellitus with other specified complication: Secondary | ICD-10-CM | POA: Diagnosis not present

## 2018-09-17 DIAGNOSIS — M79675 Pain in left toe(s): Secondary | ICD-10-CM | POA: Diagnosis not present

## 2018-09-17 DIAGNOSIS — Z7689 Persons encountering health services in other specified circumstances: Secondary | ICD-10-CM | POA: Diagnosis not present

## 2018-09-20 DIAGNOSIS — E1169 Type 2 diabetes mellitus with other specified complication: Secondary | ICD-10-CM | POA: Diagnosis not present

## 2018-09-24 DIAGNOSIS — M79675 Pain in left toe(s): Secondary | ICD-10-CM | POA: Diagnosis not present

## 2018-09-24 DIAGNOSIS — E1169 Type 2 diabetes mellitus with other specified complication: Secondary | ICD-10-CM | POA: Diagnosis not present

## 2018-10-01 DIAGNOSIS — M21372 Foot drop, left foot: Secondary | ICD-10-CM | POA: Diagnosis not present

## 2018-10-01 DIAGNOSIS — M5416 Radiculopathy, lumbar region: Secondary | ICD-10-CM | POA: Diagnosis not present

## 2018-10-01 DIAGNOSIS — E1169 Type 2 diabetes mellitus with other specified complication: Secondary | ICD-10-CM | POA: Diagnosis not present

## 2018-10-01 DIAGNOSIS — Z23 Encounter for immunization: Secondary | ICD-10-CM | POA: Diagnosis not present

## 2018-10-08 DIAGNOSIS — E1169 Type 2 diabetes mellitus with other specified complication: Secondary | ICD-10-CM | POA: Diagnosis not present

## 2018-10-08 DIAGNOSIS — M79675 Pain in left toe(s): Secondary | ICD-10-CM | POA: Diagnosis not present

## 2018-10-08 DIAGNOSIS — Z7689 Persons encountering health services in other specified circumstances: Secondary | ICD-10-CM | POA: Diagnosis not present

## 2018-10-11 DIAGNOSIS — L814 Other melanin hyperpigmentation: Secondary | ICD-10-CM | POA: Diagnosis not present

## 2018-10-11 DIAGNOSIS — D2239 Melanocytic nevi of other parts of face: Secondary | ICD-10-CM | POA: Diagnosis not present

## 2018-10-11 DIAGNOSIS — L821 Other seborrheic keratosis: Secondary | ICD-10-CM | POA: Diagnosis not present

## 2018-10-11 DIAGNOSIS — D2261 Melanocytic nevi of right upper limb, including shoulder: Secondary | ICD-10-CM | POA: Diagnosis not present

## 2018-10-11 DIAGNOSIS — D2262 Melanocytic nevi of left upper limb, including shoulder: Secondary | ICD-10-CM | POA: Diagnosis not present

## 2018-10-11 DIAGNOSIS — Z85828 Personal history of other malignant neoplasm of skin: Secondary | ICD-10-CM | POA: Diagnosis not present

## 2018-10-11 DIAGNOSIS — D225 Melanocytic nevi of trunk: Secondary | ICD-10-CM | POA: Diagnosis not present

## 2018-10-11 DIAGNOSIS — D1801 Hemangioma of skin and subcutaneous tissue: Secondary | ICD-10-CM | POA: Diagnosis not present

## 2018-10-17 DIAGNOSIS — E1169 Type 2 diabetes mellitus with other specified complication: Secondary | ICD-10-CM | POA: Diagnosis not present

## 2018-10-18 DIAGNOSIS — D72819 Decreased white blood cell count, unspecified: Secondary | ICD-10-CM | POA: Diagnosis not present

## 2018-10-18 DIAGNOSIS — E119 Type 2 diabetes mellitus without complications: Secondary | ICD-10-CM | POA: Diagnosis not present

## 2018-10-18 DIAGNOSIS — A498 Other bacterial infections of unspecified site: Secondary | ICD-10-CM | POA: Diagnosis not present

## 2018-10-18 DIAGNOSIS — I10 Essential (primary) hypertension: Secondary | ICD-10-CM | POA: Diagnosis not present

## 2018-10-18 DIAGNOSIS — T8149XA Infection following a procedure, other surgical site, initial encounter: Secondary | ICD-10-CM | POA: Diagnosis not present

## 2018-10-18 DIAGNOSIS — R748 Abnormal levels of other serum enzymes: Secondary | ICD-10-CM | POA: Diagnosis not present

## 2018-10-18 DIAGNOSIS — L089 Local infection of the skin and subcutaneous tissue, unspecified: Secondary | ICD-10-CM | POA: Diagnosis not present

## 2018-10-18 DIAGNOSIS — Z79899 Other long term (current) drug therapy: Secondary | ICD-10-CM | POA: Diagnosis not present

## 2018-10-18 DIAGNOSIS — Z969 Presence of functional implant, unspecified: Secondary | ICD-10-CM | POA: Diagnosis not present

## 2018-11-13 DIAGNOSIS — R109 Unspecified abdominal pain: Secondary | ICD-10-CM | POA: Diagnosis not present

## 2018-11-13 DIAGNOSIS — Z7689 Persons encountering health services in other specified circumstances: Secondary | ICD-10-CM | POA: Diagnosis not present

## 2018-11-13 DIAGNOSIS — I10 Essential (primary) hypertension: Secondary | ICD-10-CM | POA: Diagnosis not present

## 2018-12-10 DIAGNOSIS — E1169 Type 2 diabetes mellitus with other specified complication: Secondary | ICD-10-CM | POA: Diagnosis not present

## 2018-12-17 ENCOUNTER — Other Ambulatory Visit: Payer: Self-pay | Admitting: Nurse Practitioner

## 2018-12-25 DIAGNOSIS — I209 Angina pectoris, unspecified: Secondary | ICD-10-CM | POA: Diagnosis not present

## 2018-12-25 DIAGNOSIS — E611 Iron deficiency: Secondary | ICD-10-CM | POA: Diagnosis not present

## 2018-12-25 DIAGNOSIS — J45909 Unspecified asthma, uncomplicated: Secondary | ICD-10-CM | POA: Diagnosis not present

## 2018-12-25 DIAGNOSIS — M21372 Foot drop, left foot: Secondary | ICD-10-CM | POA: Diagnosis not present

## 2018-12-25 DIAGNOSIS — E1169 Type 2 diabetes mellitus with other specified complication: Secondary | ICD-10-CM | POA: Diagnosis not present

## 2018-12-25 DIAGNOSIS — G4730 Sleep apnea, unspecified: Secondary | ICD-10-CM | POA: Diagnosis not present

## 2018-12-25 DIAGNOSIS — E785 Hyperlipidemia, unspecified: Secondary | ICD-10-CM | POA: Diagnosis not present

## 2018-12-25 DIAGNOSIS — I10 Essential (primary) hypertension: Secondary | ICD-10-CM | POA: Diagnosis not present

## 2018-12-25 DIAGNOSIS — F418 Other specified anxiety disorders: Secondary | ICD-10-CM | POA: Diagnosis not present

## 2018-12-25 DIAGNOSIS — M5416 Radiculopathy, lumbar region: Secondary | ICD-10-CM | POA: Diagnosis not present

## 2018-12-25 DIAGNOSIS — I2510 Atherosclerotic heart disease of native coronary artery without angina pectoris: Secondary | ICD-10-CM | POA: Diagnosis not present

## 2018-12-26 ENCOUNTER — Telehealth: Payer: Self-pay | Admitting: Nurse Practitioner

## 2018-12-26 NOTE — Telephone Encounter (Signed)
S/w pt's wife per (DPR) is aware of [facility] policy, no visitors in office with pt unless pt is unable to take care of themselves. Stated wife can be on speaker phone when [name] talks with pt. Wife is agreeable and be waiting for Lori's call. Speaker phone also listed in appt notes.

## 2018-12-26 NOTE — Telephone Encounter (Signed)
New Message    [name] is calling and would like to assit the pt to his appt because she said she handles all of his medications

## 2018-12-27 NOTE — Progress Notes (Signed)
CARDIOLOGY OFFICE NOTE  Date:  12/31/2018    Hunter Mcbride. Date of Birth: 07/08/1945 Medical Record #[id number]  PCP:  Crist Infante, MD  Cardiologist:  Servando Snare & Cooper/Nahser  Chief Complaint  Patient presents with  . Follow-up    History of Present Illness: Hunter Mcbride. is a 73 y.o. male who presents today for a 6 month check.  Seen for Dr. Burt Knack & Nahser. Primarily sees me.  He has along standinghistory ofCAD, HTN, HLD, DM and OSA. He has had prior DES x 3 to the LAD back in 2015. Several caths since. Ended up being referred for CABG x 5 in September of 2018 by [facility]. He isIntolerant to higher doses of nitrates despite multiple attempts. Hadnot wanted to increase Ranexa due to cost. Has had chronic tendency to really overexert himself. It wassurprising how very well his exercise tolerance is and that his angina is typically always at rest - this is chronic. He has had prior echo showing mild dilatation of the aortabut not identified on repeat studies.   He has had repeat cath since his CABG in November of 2019 - to continue with medical management.   Last seen here in the office back last November 2019 and was felt to be stable. He has had to have emergent back surgery earlier this year in 2020 due to low back, buttock, radicular left leg pain - failed on conservative treatment and had left L5 foraminotomy. We did a telehealth visit back in May - he was doing ok - was getting ready to have lumbar fusion in June at [facility]. Was on chronic narcotics. Cardiac status felt to be stable.   The patient does not have symptoms concerning for COVID-19 infection (fever, chills, cough, or new shortness of breath).   Comes in today. Here alone. Hunter Mcbride is on the phone. He had his fusion in June - had incision issues/infection and had to have a wound washing and vac placed in July. Took prolonged course of antibiotics. Has done well with that and has now recovered. Does  note some back pain/burning - especially over the left scapula- over the past few weeks - like his angina - identical - off and on. Has gotten more active and notes this discomfort sometimes with exertion but afterwards too. Saw Dr. Joylene Draft last week - questioned whether cath was needed - NTG does not help - has used on several occasions. This discomfort can last for hours at a time. Has used Tylenol and heating pad to no avail. Noting less energy and feeling more tired.   Past Medical History:  Diagnosis Date  . Arthritis    "lower back; right shoulder; left thumb; joints" (06/21/2013)  . Asthma    "not sure if this is true or not" (06/21/2013)  . CAD (coronary artery disease) 06/2013   s/p PTCA/DES x 3 to mid LAD 2015. b. s/p CABG in 2018. c. cath 12/2017 -> med rx.  [name] GERD (gastroesophageal reflux disease)   . Gout    "maybe twice in my life"  . Headache(784.0)    "weekly for the last 3-4 months" (06/21/2013)  . Hyperlipidemia   . Hypertension   . Myocardial infarction [facility])    "/Dr. Katharina Caper I've had one between 2014-2015) (06/21/2013)  . Osteoarthritis   . Sleep apnea    WEIGHT LOSS, NO LONGER NEEDS PER PATIENT  . Type II diabetes mellitus ([place])    TYPE 2    Past  Surgical History:  Procedure Laterality Date  . CARDIAC CATHETERIZATION  1980's   "once"  . CARDIAC CATHETERIZATION N/A 07/23/2015   Procedure: Left Heart Cath and Coronary Angiography;  Surgeon: Sherren Mocha, MD;  Location: [place] [facility];  Service: Cardiovascular;  Laterality: N/A;  . CARDIOVASCULAR STRESS TEST  06/10/2008   EF 68%  . CARPAL TUNNEL RELEASE Bilateral   . CORONARY ANGIOPLASTY WITH STENT PLACEMENT  06/21/2013   "3"  . CORONARY ARTERY BYPASS GRAFT N/A 11/07/2016   Procedure: CORONARY ARTERY BYPASS GRAFTING (CABG) x 5 (LIMA to DISTAL LAD, SVG to DIAGONAL, SVG to CIRCUMFLEX, and SVG SEQUENTIALLY to PLB and DISTAL PDA) with EVH of the RIGHT GREATER SAPHENOUS VEIN and LEFT INTERNAL MAMMARY ARTERY HARVEST;  Surgeon:  Grace Isaac, MD;  Location: [place];  Service: Open Heart Surgery;  Laterality: N/A;  . CORONARY STENT INTERVENTION N/A 06/07/2016   Procedure: Coronary Stent Intervention;  Surgeon: Burnell Blanks, MD;  Location: [place] [facility];  Service: Cardiovascular;  Laterality: N/A;  . EXCISION MORTON'S NEUROMA Left   . EYE SURGERY Left    "removed film over my eye"  . FRACTIONAL FLOW RESERVE WIRE N/A 06/26/2013   Procedure: FRACTIONAL FLOW RESERVE WIRE;  Surgeon: [name], MD;  Location: [place] CATH LAB;  Service: Cardiovascular;  Laterality: N/A;  . HERNIA REPAIR Left   . INTRAVASCULAR PRESSURE WIRE/FFR STUDY N/A 10/28/2016   Procedure: INTRAVASCULAR PRESSURE WIRE/FFR STUDY;  Surgeon: Leonie Man, MD;  Location: [place] [facility];  Service: Cardiovascular;  Laterality: N/A;  . JOINT REPLACEMENT Left 03/2013   "thumb"  . LEFT HEART CATH AND CORONARY ANGIOGRAPHY N/A 06/07/2016   Procedure: Left Heart Cath and Coronary Angiography;  Surgeon: Burnell Blanks, MD;  Location: [place] [facility];  Service: Cardiovascular;  Laterality: N/A;  . LEFT HEART CATH AND CORONARY ANGIOGRAPHY N/A 10/28/2016   Procedure: LEFT HEART CATH AND CORONARY ANGIOGRAPHY;  Surgeon: Leonie Man, MD;  Location: [place] [facility];  Service: Cardiovascular;  Laterality: N/A;  . LEFT HEART CATH AND CORS/GRAFTS ANGIOGRAPHY N/A 12/25/2017   Procedure: LEFT HEART CATH AND CORS/GRAFTS ANGIOGRAPHY;  Surgeon: Sherren Mocha, MD;  Location: [place] [facility];  Service: Cardiovascular;  Laterality: N/A;  . LEFT HEART CATHETERIZATION WITH CORONARY ANGIOGRAM N/A 06/21/2013   Procedure: LEFT HEART CATHETERIZATION WITH CORONARY ANGIOGRAM;  Surgeon: Burnell Blanks, MD;  Location: [facility] C[facility] LAB;  Service: Cardiovascular;  Laterality: N/A;  . LEFT HEART CATHETERIZATION WITH CORONARY ANGIOGRAM N/A 06/26/2013   Procedure: LEFT HEART CATHETERIZATION WITH CORONARY ANGIOGRAM;  Surgeon: [name], MD;   Location: [place] CATH LAB;  Service: Cardiovascular;  Laterality: N/A;  . LUMBAR LAMINECTOMY/DECOMPRESSION MICRODISCECTOMY Left 05/30/2018   Procedure: Left  Gill L5 decompression;  Surgeon: Melina Schools, MD;  Location: [place];  Service: Orthopedics;  Laterality: Left;  187min  . TEE WITHOUT CARDIOVERSION N/A 11/07/2016   Procedure: TRANSESOPHAGEAL ECHOCARDIOGRAM (TEE);  Surgeon: Grace Isaac, MD;  Location: [place];  Service: Open Heart Surgery;  Laterality: N/A;  . TONSILLECTOMY    . TOTAL HIP ARTHROPLASTY Right 04/12/2016   Procedure: RIGHT TOTAL HIP ARTHROPLASTY ANTERIOR APPROACH;  Surgeon: Paralee Cancel, MD;  Location: [facility];  Service: Orthopedics;  Laterality: Right;  requests 70 mins  . TOTAL SHOULDER ARTHROPLASTY  03/01/2012   Procedure: TOTAL SHOULDER ARTHROPLASTY;  Surgeon: Marin Shutter, MD;  Location: [place];  Service: Orthopedics;  Laterality: Right;  . UMBILICAL HERNIA REPAIR  Medications: Current Meds  Medication Sig  . acetaminophen (TYLENOL) 500 MG tablet Take 1,000 mg by mouth every 8 (eight) hours as needed (pain).   [name] ALPRAZolam (XANAX) 0.5 MG tablet Take 0.25 mg by mouth as needed for anxiety or sleep.  [name] amLODipine (NORVASC) 5 MG tablet TAKE ONE TABLET BY MOUTH TWICE DAILY   . aspirin EC 81 MG tablet Take 81 mg by mouth daily.  [name] atenolol (TENORMIN) 50 MG tablet Take 1 tablet (50 mg total) by mouth at bedtime.  [name] atorvastatin (LIPITOR) 80 MG tablet Take 80 mg by mouth daily at 6 PM.  . canagliflozin (INVOKANA) 300 MG TABS tablet Take 300 mg by mouth daily before breakfast.  . Cholecalciferol (VITAMIN D) 50 MCG (2000 UT) tablet Take 2,000 Units by mouth daily.  . clopidogrel (PLAVIX) 75 MG tablet TAKE ONE TABLET BY MOUTH ONE TIME DAILY breakfast  . docusate sodium (COLACE) 100 MG capsule Take 100 mg by mouth daily as needed for mild constipation.  [name] FREESTYLE TEST STRIPS test strip   . gabapentin (NEURONTIN) 300 MG capsule Take 300 mg by mouth 2 (two) times daily.    . hydrochlorothiazide (HYDRODIURIL) 25 MG tablet Take 25 mg by mouth daily.  [name] HYDROcodone-acetaminophen (NORCO) 10-325 MG tablet hydrocodone 10 mg-acetaminophen 325 mg tablet  TAKE 1 TABLETS BY MOUTH 4 TIMES A DAY WITH MEALS FOR 14 DAYS.  . Insulin Glargine, 1 Unit Dial, (TOUJEO SOLOSTAR) 300 UNIT/ML SOPN Inject 18 Units into the skin daily.  . irbesartan (AVAPRO) 300 MG tablet Take 300 mg by mouth daily.  . metFORMIN (GLUCOPHAGE-XR) 500 MG 24 hr tablet Take 500 mg by mouth 2 (two) times daily.  . methocarbamol (ROBAXIN) 500 MG tablet Take 500 mg by mouth every 8 (eight) hours as needed for muscle spasms (back pain).   . Multiple Vitamin (MULTIVITAMIN) tablet Take 1 tablet by mouth daily.  . nitroGLYCERIN (NITROSTAT) 0.4 MG SL tablet DISSOLVE 1 TABLET UNDER TONGUE AS NEEDEDFOR CHEST PAIN. MAY REPEAT 5 MINUTES APART 3 TIMES IF NEEDED  . pantoprazole (PROTONIX) 40 MG tablet Take 1 tablet (40 mg total) by mouth 2 (two) times daily before a meal.  . polyethylene glycol (MIRALAX / GLYCOLAX) packet Take 17 g by mouth daily as needed for mild constipation.  . promethazine (PHENERGAN) 25 MG tablet Take 12.5 mg by mouth every 6 (six) hours as needed.     Allergies: Allergies  Allergen Reactions  . Oxycodone Nausea Only    Social History: The patient  reports that he has never smoked. He has never used smokeless tobacco. He reports that he does not drink alcohol or use drugs.   Family History: The patient's family history includes Coronary artery disease in his cousin; Diabetes type II in his mother; Heart attack in his father; Heart disease in his brother.   Review of Systems: Please see the history of present illness.   All other systems are reviewed and negative.   Physical Exam: VS:  BP 118/66   Pulse 62   Ht 5\' 9"  (1.753 m)   Wt 191 lb 12.8 oz (87 kg)   SpO2 98%   BMI 28.32 kg/m  .  BMI Body mass index is 28.32 kg/m.  Wt Readings from Last 3 Encounters:  12/31/18 191 lb 12.8 oz  (87 kg)  07/12/18 193 lb (87.5 kg)  05/30/18 201 lb (91.2 kg)    General: Alert and in no acute distress.   HEENT: Normal.  Neck: Supple, no  JVD, carotid bruits, or masses noted.  Cardiac: Regular rate and rhythm. No murmurs, rubs, or gallops. No edema.  Respiratory:  Lungs are clear to auscultation bilaterally with normal work of breathing.  GI: Soft and nontender.  MS: No deformity or atrophy. Gait and ROM intact.  Skin: Warm and dry. Color is normal.  Neuro:  Strength and sensation are intact and no gross focal deficits noted.  Psych: Alert, appropriate and with normal affect.   LABORATORY DATA:  EKG:  EKG is ordered today. This demonstrates sinus bradycardia with PVC noted - HR is 57.  Lab Results  Component Value Date   WBC 4.3 05/25/2018   HGB 12.9 (L) 05/30/2018   HCT 38.0 (L) 05/30/2018   PLT 142 (L) 05/25/2018   GLUCOSE 106 (H) 05/30/2018   CHOL 90 12/25/2017   TRIG 89 12/25/2017   HDL 36 (L) 12/25/2017   LDLCALC 36 12/25/2017   ALT 38 12/25/2017   AST 29 12/25/2017   NA 140 05/30/2018   K 4.0 05/30/2018   CL 103 05/25/2018   CREATININE 1.02 05/25/2018   BUN 25 (H) 05/25/2018   CO2 28 05/25/2018   TSH 2.009 06/06/2016   INR 1.23 12/25/2017   HGBA1C 7.9 (H) 05/25/2018       BNP (last 3 results) No results for input(s): BNP in the last 8760 hours.  ProBNP (last 3 results) No results for input(s): PROBNP in the last 8760 hours.   Other Studies Reviewed Today:  LEFT HEART CATH AND CORS/GRAFTS ANGIOGRAPHY11/2019  Conclusion     Ost LM lesion is 40% stenosed.  Dist LAD lesion is 40% stenosed.  1st Diag lesion is 80% stenosed.  2nd Diag lesion is 70% stenosed.  Prox LAD to Dist LAD lesion is 50% stenosed.  Mid Cx lesion is 60% stenosed.  1st Mrg lesion is 75% stenosed.  Non-stenotic Dist RCA lesion was previously treated.  Prox RCA to Mid RCA lesion is 35% stenosed.  Mid RCA lesion is 40% stenosed.  Ost RPDA to RPDA lesion is 40%  stenosed.  RPDA lesion is 85% stenosed.  SVG graft was visualized by angiography and is normal in caliber.  The graft exhibits no disease.  LIMA.  SVG.  Seq SVG-.  1. Patent left mainstem 2. Patent LAD with mild diffuse stenosis, severe diagonal stenosis with a patent graft 3. Severe first obtuse marginal stenosis with a patent graft  4. Patent RCA with mild nonobstructive disease and severe PDA stenosis with patent graft to the PDA and PLA branches 5. Atretic LIMA to LAD, LAD supplied via antegrade native vessel flow as well as retrograde flow from the diagonal branch saphenous vein graft. 6. Normal LVEDP  Recommend ongoing medical therapy. If the patient is chest pain-free this evening, hospital discharge would be reasonable. If recurrent chest pain would observe overnight with serial cardiac markers.      SURGICAL PROCEDURE9/2018:  Coronary artery bypass grafting x5 with the left internal mammary to the distal left anterior descending coronary artery, reverse saphenous vein graft to the diagonal, reverse saphenous vein graft to the first obtuse marginal, sequential reverse saphenous vein graft to the proximal posterior lateral branch and to the distal posterior descending with right leg, thigh, and calf greater saphenous vein harvesting endoscopically.    ASSESSMENT & PLAN:    1. CAD - with prior DES x 3 to the LAD, prior DES x 1 to proximal PLA and balloon PCI to proximal PDA - subsequent X CABG x 5  with LIMA to distal LAD, SVG to DX, SVG to 1st OM &sequential SVG to proximal posterior lateral and distal PD in 10/2016. Last cath for chest pain in November of 2019 with stable findings - does have atretic LIMA - he is to be managed medically. Now has had return of symptoms over the past few weeks - would favor trying medical management - he is on Norvasc. I am adding back Ranexa 500 mg BID today. If no improvement, will need to consider repeat cath.   2.  HTN - BP ok on current regimen.   3. HLD - on statin - labs noted.   4. DM - per PCP - A1C is at 7  5. Back surgery with incision complication - seems resolved.   6. COVID-19 Education: The signs and symptoms of COVID-19 were discussed with the patient and how to seek care for testing (follow up with PCP or arrange E-visit).  The importance of social distancing, staying at home, hand hygiene and wearing a mask when out in public were discussed today.  Current medicines are reviewed with the patient today.  The patient does not have concerns regarding medicines other than what has been noted above.  The following changes have been made:  See above.  Labs/ tests ordered today include:    Orders Placed This Encounter  Procedures  . EKG 12-Lead     Disposition:   FU with me in about 2 to 3 weeks.   Patient is agreeable to this plan and will call if any problems develop in the interim.   SignedTruitt Merle, NP  12/31/2018 10:02 AM  [place] [address] [place], [place]  [address] Phone: [phone] Fax: [phone]

## 2018-12-31 ENCOUNTER — Encounter: Payer: Self-pay | Admitting: Nurse Practitioner

## 2018-12-31 ENCOUNTER — Other Ambulatory Visit: Payer: Self-pay

## 2018-12-31 ENCOUNTER — Ambulatory Visit: Payer: PPO | Admitting: Nurse Practitioner

## 2018-12-31 VITALS — BP 118/66 | HR 62 | Ht 69.0 in | Wt 191.8 lb

## 2018-12-31 DIAGNOSIS — E785 Hyperlipidemia, unspecified: Secondary | ICD-10-CM | POA: Diagnosis not present

## 2018-12-31 DIAGNOSIS — I10 Essential (primary) hypertension: Secondary | ICD-10-CM | POA: Diagnosis not present

## 2018-12-31 DIAGNOSIS — Z951 Presence of aortocoronary bypass graft: Secondary | ICD-10-CM | POA: Diagnosis not present

## 2018-12-31 DIAGNOSIS — I259 Chronic ischemic heart disease, unspecified: Secondary | ICD-10-CM

## 2018-12-31 MED ORDER — RANOLAZINE ER 500 MG PO TB12: 500.0000 mg | ORAL_TABLET | Freq: Two times a day (BID) | ORAL | 3 refills | Status: DC

## 2018-12-31 NOTE — Patient Instructions (Addendum)
After Visit Summary:  We will be checking the following labs today - NONE   Medication Instructions:    Continue with your current medicines. BUT  I am adding back Ranexa 500 mg to take twice a day - this is at [facility]    If you need a refill on your cardiac medications before your next appointment, please call your pharmacy.     Testing/Procedures To Be Arranged:  N/A  Follow-Up:   See me in about 2 to 3 weeks.      At [facility], you and your health needs are our priority.  As part of our continuing mission to provide you with exceptional heart care, we have created designated Provider Care Teams.  These Care Teams include your primary Cardiologist (physician) and Advanced Practice Providers (APPs -  Physician Assistants and Nurse Practitioners) who all work together to provide you with the care you need, when you need it.  Special Instructions:  . Stay safe, stay home, wash your hands for at least 20 seconds and wear a mask when out in public.     Call the [place] office at [phone] if you have any questions, problems or concerns.

## 2019-01-24 DIAGNOSIS — Z4789 Encounter for other orthopedic aftercare: Secondary | ICD-10-CM | POA: Diagnosis not present

## 2019-01-24 DIAGNOSIS — M4317 Spondylolisthesis, lumbosacral region: Secondary | ICD-10-CM | POA: Diagnosis not present

## 2019-01-24 DIAGNOSIS — Z9889 Other specified postprocedural states: Secondary | ICD-10-CM | POA: Diagnosis not present

## 2019-01-26 NOTE — Progress Notes (Signed)
CARDIOLOGY OFFICE NOTE  Date:  01/28/2019    Hunter Mcbride. Date of Birth: 1945-05-29 Medical Record #[id number]  PCP:  Crist Infante, MD  Cardiologist:  Servando Snare & Cooper/Nahser   Chief Complaint  Patient presents with  . Follow-up    Seen for Dr. Maryruth Eve    History of Present Illness: Hunter Mcbride. is a 73 y.o. male who presents today for a one month check.   Seen for Dr. Burt Knack & Nahser. Primarily sees me.  He has along standinghistory ofCAD, HTN, HLD, DM and OSA. He has had prior DES x 3 to the LAD back in 2015. Several caths since. Ended up being referred for CABG x 5 in September of 2018 by [facility]. He isIntolerant to higher doses of nitrates despite multiple attempts. Hadnot wanted to increase Ranexa due to cost. Has had chronic tendency to really overexert himself. It wassurprising how very well his exercise tolerance is and that his angina is typically always at rest - this is chronic. He has had prior echo showing mild dilatation of the aortabut not identified on repeat studies.   He has had repeat cath since his CABG in November of 2019 - to continue with medical management.   Last seen here in the office back last November 2019 and was felt to be stable. He had emergent back surgery earlier this yearin 2020 due to low back, buttock, radicular left leg pain - failed on conservative treatment and had left L5 foraminotomy.We did a telehealth visit back in May - he was doing ok - was getting ready to have lumbar fusion in June at [name]. Was on chronic narcotics. Cardiac status felt to be stable.   Last seen here last month - he had had his lumbar fusion - had post op infection that required a wound vac. Took prolonged course of antibiotics. He had endorsed the return of his angina - using NTG again - I placed him back on Ranexa.   The patient does not have symptoms concerning for COVID-19 infection (fever, chills, cough, or new shortness of breath).     Comes in today. Here alone. Feeling better. No chest pain. Breathing is good. Feels better on the Ranexa now. Considerable difference noted.   Past Medical History:  Diagnosis Date  . Arthritis    "lower back; right shoulder; left thumb; joints" (06/21/2013)  . Asthma    "not sure if this is true or not" (06/21/2013)  . CAD (coronary artery disease) 06/2013   s/p PTCA/DES x 3 to mid LAD 2015. b. s/p CABG in 2018. c. cath 12/2017 -> med rx.  [name] GERD (gastroesophageal reflux disease)   . Gout    "maybe twice in my life"  . Headache(784.0)    "weekly for the last 3-4 months" (06/21/2013)  . Hyperlipidemia   . Hypertension   . Myocardial infarction [facility])    "/Dr. Katharina Caper I've had one between 2014-2015) (06/21/2013)  . Osteoarthritis   . Sleep apnea    WEIGHT LOSS, NO LONGER NEEDS PER PATIENT  . Type II diabetes mellitus ([place])    TYPE 2    Past Surgical History:  Procedure Laterality Date  . CARDIAC CATHETERIZATION  1980's   "once"  . CARDIAC CATHETERIZATION N/A 07/23/2015   Procedure: Left Heart Cath and Coronary Angiography;  Surgeon: Sherren Mocha, MD;  Location: [place] CV LAB;  Service: Cardiovascular;  Laterality: N/A;  . CARDIOVASCULAR STRESS TEST  06/10/2008   EF 68%  .  CARPAL TUNNEL RELEASE Bilateral   . CORONARY ANGIOPLASTY WITH STENT PLACEMENT  06/21/2013   "3"  . CORONARY ARTERY BYPASS GRAFT N/A 11/07/2016   Procedure: CORONARY ARTERY BYPASS GRAFTING (CABG) x 5 (LIMA to DISTAL LAD, SVG to DIAGONAL, SVG to CIRCUMFLEX, and SVG SEQUENTIALLY to PLB and DISTAL PDA) with EVH of the RIGHT GREATER SAPHENOUS VEIN and LEFT INTERNAL MAMMARY ARTERY HARVEST;  Surgeon: Grace Isaac, MD;  Location: [place];  Service: Open Heart Surgery;  Laterality: N/A;  . CORONARY STENT INTERVENTION N/A 06/07/2016   Procedure: Coronary Stent Intervention;  Surgeon: Burnell Blanks, MD;  Location: [place] CV LAB;  Service: Cardiovascular;  Laterality: N/A;  . EXCISION MORTON'S NEUROMA Left    . EYE SURGERY Left    "removed film over my eye"  . FRACTIONAL FLOW RESERVE WIRE N/A 06/26/2013   Procedure: FRACTIONAL FLOW RESERVE WIRE;  Surgeon: [name], MD;  Location: [place] CATH LAB;  Service: Cardiovascular;  Laterality: N/A;  . HERNIA REPAIR Left   . INTRAVASCULAR PRESSURE WIRE/FFR STUDY N/A 10/28/2016   Procedure: INTRAVASCULAR PRESSURE WIRE/FFR STUDY;  Surgeon: Leonie Man, MD;  Location: [place] CV LAB;  Service: Cardiovascular;  Laterality: N/A;  . JOINT REPLACEMENT Left 03/2013   "thumb"  . LEFT HEART CATH AND CORONARY ANGIOGRAPHY N/A 06/07/2016   Procedure: Left Heart Cath and Coronary Angiography;  Surgeon: Burnell Blanks, MD;  Location: [place] CV LAB;  Service: Cardiovascular;  Laterality: N/A;  . LEFT HEART CATH AND CORONARY ANGIOGRAPHY N/A 10/28/2016   Procedure: LEFT HEART CATH AND CORONARY ANGIOGRAPHY;  Surgeon: Leonie Man, MD;  Location: [place] CV LAB;  Service: Cardiovascular;  Laterality: N/A;  . LEFT HEART CATH AND CORS/GRAFTS ANGIOGRAPHY N/A 12/25/2017   Procedure: LEFT HEART CATH AND CORS/GRAFTS ANGIOGRAPHY;  Surgeon: Sherren Mocha, MD;  Location: [place] CV LAB;  Service: Cardiovascular;  Laterality: N/A;  . LEFT HEART CATHETERIZATION WITH CORONARY ANGIOGRAM N/A 06/21/2013   Procedure: LEFT HEART CATHETERIZATION WITH CORONARY ANGIOGRAM;  Surgeon: Burnell Blanks, MD;  Location: [facility] C[facility];  Service: Cardiovascular;  Laterality: N/A;  . LEFT HEART CATHETERIZATION WITH CORONARY ANGIOGRAM N/A 06/26/2013   Procedure: LEFT HEART CATHETERIZATION WITH CORONARY ANGIOGRAM;  Surgeon: [name], MD;  Location: [place] CATH LAB;  Service: Cardiovascular;  Laterality: N/A;  . LUMBAR LAMINECTOMY/DECOMPRESSION MICRODISCECTOMY Left 05/30/2018   Procedure: Left  Gill L5 decompression;  Surgeon: Melina Schools, MD;  Location: [place];  Service: Orthopedics;  Laterality: Left;  114min  . TEE WITHOUT CARDIOVERSION N/A 11/07/2016   Procedure:  TRANSESOPHAGEAL ECHOCARDIOGRAM (TEE);  Surgeon: Grace Isaac, MD;  Location: [place];  Service: Open Heart Surgery;  Laterality: N/A;  . TONSILLECTOMY    . TOTAL HIP ARTHROPLASTY Right 04/12/2016   Procedure: RIGHT TOTAL HIP ARTHROPLASTY ANTERIOR APPROACH;  Surgeon: Paralee Cancel, MD;  Location: [facility];  Service: Orthopedics;  Laterality: Right;  requests 70 mins  . TOTAL SHOULDER ARTHROPLASTY  03/01/2012   Procedure: TOTAL SHOULDER ARTHROPLASTY;  Surgeon: Marin Shutter, MD;  Location: [place];  Service: Orthopedics;  Laterality: Right;  . UMBILICAL HERNIA REPAIR       Medications: Current Meds  Medication Sig  . acetaminophen (TYLENOL) 500 MG tablet Take 1,000 mg by mouth every 8 (eight) hours as needed (pain).   [name] ALPRAZolam (XANAX) 0.5 MG tablet Take 0.25 mg by mouth as needed for anxiety or sleep.  [name] amLODipine (NORVASC) 5 MG tablet TAKE ONE TABLET BY MOUTH TWICE  DAILY   . aspirin EC 81 MG tablet Take 81 mg by mouth daily.  [name] atenolol (TENORMIN) 50 MG tablet Take 1 tablet (50 mg total) by mouth at bedtime.  [name] atorvastatin (LIPITOR) 80 MG tablet Take 80 mg by mouth daily at 6 PM.  . canagliflozin (INVOKANA) 300 MG TABS tablet Take 300 mg by mouth daily before breakfast.  . Cholecalciferol (VITAMIN D) 50 MCG (2000 UT) tablet Take 2,000 Units by mouth daily.  . clopidogrel (PLAVIX) 75 MG tablet TAKE ONE TABLET BY MOUTH ONE TIME DAILY breakfast  . docusate sodium (COLACE) 100 MG capsule Take 100 mg by mouth daily as needed for mild constipation.  [name] FREESTYLE TEST STRIPS test strip 1 each by Other route every morning.   . gabapentin (NEURONTIN) 300 MG capsule Take 300 mg by mouth 2 (two) times daily.   . hydrochlorothiazide (HYDRODIURIL) 25 MG tablet Take 25 mg by mouth daily.  . insulin degludec (TRESIBA FLEXTOUCH) 100 UNIT/ML SOPN FlexTouch Pen Inject 18 Units into the skin daily.   . Insulin Glargine, 1 Unit Dial, (TOUJEO SOLOSTAR) 300 UNIT/ML SOPN Inject 18 Units into the skin daily.    . irbesartan (AVAPRO) 300 MG tablet Take 300 mg by mouth daily.  . metFORMIN (GLUCOPHAGE-XR) 500 MG 24 hr tablet Take 500 mg by mouth 2 (two) times daily.  . methocarbamol (ROBAXIN) 500 MG tablet Take 500 mg by mouth every 8 (eight) hours as needed for muscle spasms (back pain).   . Multiple Vitamin (MULTIVITAMIN) tablet Take 1 tablet by mouth daily.  . nitroGLYCERIN (NITROSTAT) 0.4 MG SL tablet DISSOLVE 1 TABLET UNDER TONGUE AS NEEDEDFOR CHEST PAIN. MAY REPEAT 5 MINUTES APART 3 TIMES IF NEEDED  . pantoprazole (PROTONIX) 40 MG tablet Take 1 tablet (40 mg total) by mouth 2 (two) times daily before a meal.  . polyethylene glycol (MIRALAX / GLYCOLAX) packet Take 17 g by mouth daily as needed for mild constipation.  . promethazine (PHENERGAN) 25 MG tablet Take 12.5 mg by mouth every 6 (six) hours as needed.  . ranolazine (RANEXA) 500 MG 12 hr tablet Take 1 tablet (500 mg total) by mouth 2 (two) times daily.     Allergies: Allergies  Allergen Reactions  . Oxycodone Nausea Only    Social History: The patient  reports that he has never smoked. He has never used smokeless tobacco. He reports that he does not drink alcohol or use drugs.   Family History: The patient's family history includes Coronary artery disease in his cousin; Diabetes type II in his mother; Heart attack in his father; Heart disease in his brother.   Review of Systems: Please see the history of present illness.   All other systems are reviewed and negative.   Physical Exam: VS:  BP 112/68   Pulse (!) 58   Ht 5\' 9"  (1.753 m)   Wt 195 lb 1.9 oz (88.5 kg)   SpO2 97%   BMI 28.81 kg/m  .  BMI Body mass index is 28.81 kg/m.  Wt Readings from Last 3 Encounters:  01/28/19 195 lb 1.9 oz (88.5 kg)  12/31/18 191 lb 12.8 oz (87 kg)  07/12/18 193 lb (87.5 kg)    General: Pleasant. Well developed, well nourished and in no acute distress.   HEENT: Normal.  Neck: Supple, no JVD, carotid bruits, or masses noted.  Cardiac:  Regular rate and rhythm. No murmurs, rubs, or gallops. No edema.  Respiratory:  Lungs are clear to auscultation bilaterally with normal  work of breathing.  GI: Soft and nontender.  MS: No deformity or atrophy. Gait and ROM intact.  Skin: Warm and dry. Color is normal.  Neuro:  Strength and sensation are intact and no gross focal deficits noted.  Psych: Alert, appropriate and with normal affect.   LABORATORY DATA:  EKG:  EKG is ordered today. This demonstrates sinus bradycardia with 1st degree AV block - HR of 57. Unchanged except for progressive 1st degree AV block today.   Lab Results  Component Value Date   WBC 4.3 05/25/2018   HGB 12.9 (L) 05/30/2018   HCT 38.0 (L) 05/30/2018   PLT 142 (L) 05/25/2018   GLUCOSE 106 (H) 05/30/2018   CHOL 90 12/25/2017   TRIG 89 12/25/2017   HDL 36 (L) 12/25/2017   LDLCALC 36 12/25/2017   ALT 38 12/25/2017   AST 29 12/25/2017   NA 140 05/30/2018   K 4.0 05/30/2018   CL 103 05/25/2018   CREATININE 1.02 05/25/2018   BUN 25 (H) 05/25/2018   CO2 28 05/25/2018   TSH 2.009 06/06/2016   INR 1.23 12/25/2017   HGBA1C 7.9 (H) 05/25/2018       BNP (last 3 results) No results for input(s): BNP in the last 8760 hours.  ProBNP (last 3 results) No results for input(s): PROBNP in the last 8760 hours.   Other Studies Reviewed Today:  LEFT HEART CATH AND CORS/GRAFTS ANGIOGRAPHY11/2019  Conclusion     Ost LM lesion is 40% stenosed.  Dist LAD lesion is 40% stenosed.  1st Diag lesion is 80% stenosed.  2nd Diag lesion is 70% stenosed.  Prox LAD to Dist LAD lesion is 50% stenosed.  Mid Cx lesion is 60% stenosed.  1st Mrg lesion is 75% stenosed.  Non-stenotic Dist RCA lesion was previously treated.  Prox RCA to Mid RCA lesion is 35% stenosed.  Mid RCA lesion is 40% stenosed.  Ost RPDA to RPDA lesion is 40% stenosed.  RPDA lesion is 85% stenosed.  SVG graft was visualized by angiography and is normal in caliber.  The graft  exhibits no disease.  LIMA.  SVG.  Seq SVG-.  1. Patent left mainstem 2. Patent LAD with mild diffuse stenosis, severe diagonal stenosis with a patent graft 3. Severe first obtuse marginal stenosis with a patent graft  4. Patent RCA with mild nonobstructive disease and severe PDA stenosis with patent graft to the PDA and PLA branches 5. Atretic LIMA to LAD, LAD supplied via antegrade native vessel flow as well as retrograde flow from the diagonal branch saphenous vein graft. 6. Normal LVEDP  Recommend ongoing medical therapy. If the patient is chest pain-free this evening, hospital discharge would be reasonable. If recurrent chest pain would observe overnight with serial cardiac markers.      SURGICAL PROCEDURE9/2018:  Coronary artery bypass grafting x5 with the left internal mammary to the distal left anterior descending coronary artery, reverse saphenous vein graft to the diagonal, reverse saphenous vein graft to the first obtuse marginal, sequential reverse saphenous vein graft to the proximal posterior lateral branch and to the distal posterior descending with right leg, thigh, and calf greater saphenous vein harvesting endoscopically.    ASSESSMENT & PLAN:   1. CAD - with prior DES x 3 to the LAD, prior DES x 1 to proximal PLA and balloon PCI to proximal PDA - subsequent X CABG x 5 with LIMA to distal LAD, SVG to DX, SVG to 1st OM &sequential SVG to proximal posterior lateral and  distal PDin 10/2016. Last cath for chest painin November of 2019with stable findings - does have atretic LIMA -he is to be managed medically. When seen last month - he had had return of chest pain - now on Ranexa - this has resulted in considerable improvement - will continue with medical therapy.   2. HTN - BP is fine - no changes made today.   3. HLD - on statin  4. DM - per PCP  5. Prior back surgery - seems resolved.   6. COVID-19 Education: The signs and symptoms  of COVID-19 were discussed with the patient and how to seek care for testing (follow up with PCP or arrange E-visit).  The importance of social distancing, staying at home, hand hygiene and wearing a mask when out in public were discussed today.  Current medicines are reviewed with the patient today.  The patient does not have concerns regarding medicines other than what has been noted above.  The following changes have been made:  See above.  Labs/ tests ordered today include:    Orders Placed This Encounter  Procedures  . EKG 12-Lead     Disposition:   FU with me in 3 months - overall I am happy with how he is doing. No changes made today.   Patient is agreeable to this plan and will call if any problems develop in the interim.   SignedTruitt Merle, NP  01/28/2019 11:33 AM  [place] [address] [place], [place]  [address] Phone: [phone] Fax: [phone]

## 2019-01-28 ENCOUNTER — Other Ambulatory Visit: Payer: Self-pay

## 2019-01-28 ENCOUNTER — Ambulatory Visit: Payer: PPO | Admitting: Nurse Practitioner

## 2019-01-28 ENCOUNTER — Encounter: Payer: Self-pay | Admitting: Nurse Practitioner

## 2019-01-28 VITALS — BP 112/68 | HR 58 | Ht 69.0 in | Wt 195.1 lb

## 2019-01-28 DIAGNOSIS — Z951 Presence of aortocoronary bypass graft: Secondary | ICD-10-CM | POA: Diagnosis not present

## 2019-01-28 DIAGNOSIS — Z7189 Other specified counseling: Secondary | ICD-10-CM | POA: Diagnosis not present

## 2019-01-28 DIAGNOSIS — E785 Hyperlipidemia, unspecified: Secondary | ICD-10-CM

## 2019-01-28 DIAGNOSIS — I10 Essential (primary) hypertension: Secondary | ICD-10-CM | POA: Diagnosis not present

## 2019-01-28 DIAGNOSIS — I259 Chronic ischemic heart disease, unspecified: Secondary | ICD-10-CM

## 2019-01-28 NOTE — Patient Instructions (Addendum)
After Visit Summary:  We will be checking the following labs today - NONE   Medication Instructions:    Continue with your current medicines.    If you need a refill on your cardiac medications before your next appointment, please call your pharmacy.     Testing/Procedures To Be Arranged:  N/A  Follow-Up:   See me in 3 months    At [facility], you and your health needs are our priority.  As part of our continuing mission to provide you with exceptional heart care, we have created designated Provider Care Teams.  These Care Teams include your primary Cardiologist (physician) and Advanced Practice Providers (APPs -  Physician Assistants and Nurse Practitioners) who all work together to provide you with the care you need, when you need it.  Special Instructions:  . Stay safe, stay home, wash your hands for at least 20 seconds and wear a mask when out in public.  . It was good to talk with you today.    Call the [place] office at [phone] if you have any questions, problems or concerns.

## 2019-03-11 ENCOUNTER — Encounter: Payer: Self-pay | Admitting: Podiatry

## 2019-03-11 ENCOUNTER — Ambulatory Visit: Payer: PPO | Admitting: Podiatry

## 2019-03-11 ENCOUNTER — Other Ambulatory Visit: Payer: Self-pay

## 2019-03-11 DIAGNOSIS — L601 Onycholysis: Secondary | ICD-10-CM

## 2019-03-11 DIAGNOSIS — M79676 Pain in unspecified toe(s): Secondary | ICD-10-CM | POA: Diagnosis not present

## 2019-03-11 DIAGNOSIS — B351 Tinea unguium: Secondary | ICD-10-CM | POA: Diagnosis not present

## 2019-03-11 DIAGNOSIS — E1142 Type 2 diabetes mellitus with diabetic polyneuropathy: Secondary | ICD-10-CM

## 2019-03-11 MED ORDER — NONFORMULARY OR COMPOUNDED ITEM: 3 refills | Status: DC

## 2019-03-11 NOTE — Progress Notes (Signed)
Subjective: Hunter Mcbride. presents today for follow up of preventative diabetic foot care with and painful mycotic nails b/l that are difficult to trim. Pain interferes with ambulation. Aggravating factors include wearing enclosed shoe gear. Pain is relieved with periodic professional debridement.   He states his left great toenail appears to be loose and lifting up from the nailbed. Denies any redness, drainage or swelling  He states he wears hunting boots during deer season and buys them a size larger.   Allergies  Allergen Reactions  . Oxycodone Nausea Only     Objective: There were no vitals filed for this visit.  Vascular Examination:  capillary refill time to digits immediate b/l, palpable DP pulses b/l, palpable PT pulses b/l, pedal hair present b/l and skin temperature gradient within normal limits b/l  Dermatological Examination: Pedal skin with normal turgor, texture and tone bilaterally, no open wounds bilaterally, no interdigital macerations bilaterally and toenails 1-5 b/l elongated, dystrophic, thickened, crumbly with subungual debris.  Left hallux nailplate with onycholysis of majority of toenail. Subungual fungal detritis. No underlying wound. No erythema, no edema, no drainage, no flocculence.  Musculoskeletal: normal muscle strength 5/5 to all lower extremity muscle groups bilaterally, no pain crepitus or joint limitation noted with ROM b/l and hammertoes noted to the left, right, 2nd toe, 3rd toe, 4th toe, 5th toe  Neurological: Sensation decreased with 10 gram monofilament b/l  Assessment: 1. Pain due to onychomycosis of toenail   2. Onycholysis of toenail   3. Diabetic peripheral neuropathy associated with type 2 diabetes mellitus ([place])      Plan: Continue diabetic foot care principles. Literature dispensed on today.  -Toenails 1-5 b/l were debrided in length and girth without iatrogenic bleeding. Left hallux toenail debrided to level of adherence. Digit  cleansed with alcohol and triple antibiotic ointment applied. He was instructed to apply Neosporin to digit once daily for one week. Call if he has any problems. -Patient to continue soft, supportive shoe gear daily. -Patient to report any pedal injuries to medical professional immediately. -Discussed topical, laser and oral medication. Patient opted for topical treatment with compounded medication. Rx written for nonformulary compounding topical antifungal: [facility]: Antifungal cream - Terbinafine 3%, Fluconazole 2%, Tea Tree Oil 5%, Urea 10%, Ibuprofen 2% in DMSO Suspension #41ml. Apply to the affected nail(s) at bedtime. -Patient/POA to call should there be question/concern in the interim.  Return in about 3 months (around 06/09/2019) for diabetic nail trim Plavix.

## 2019-03-11 NOTE — Patient Instructions (Addendum)
Diabetes Mellitus and Foot Care Foot care is an important part of your health, especially when you have diabetes. Diabetes may cause you to have problems because of poor blood flow (circulation) to your feet and legs, which can cause your skin to:  Become thinner and drier.  Break more easily.  Heal more slowly.  Peel and crack. You may also have nerve damage (neuropathy) in your legs and feet, causing decreased feeling in them. This means that you may not notice minor injuries to your feet that could lead to more serious problems. Noticing and addressing any potential problems early is the best way to prevent future foot problems. How to care for your feet Foot hygiene  Wash your feet daily with warm water and mild soap. Do not use hot water. Then, pat your feet and the areas between your toes until they are completely dry. Do not soak your feet as this can dry your skin.  Trim your toenails straight across. Do not dig under them or around the cuticle. File the edges of your nails with an emery board or nail file.  Apply a moisturizing lotion or petroleum jelly to the skin on your feet and to dry, brittle toenails. Use lotion that does not contain alcohol and is unscented. Do not apply lotion between your toes. Shoes and socks  Wear clean socks or stockings every day. Make sure they are not too tight. Do not wear knee-high stockings since they may decrease blood flow to your legs.  Wear shoes that fit properly and have enough cushioning. Always look in your shoes before you put them on to be sure there are no objects inside.  To break in new shoes, wear them for just a few hours a day. This prevents injuries on your feet. Wounds, scrapes, corns, and calluses  Check your feet daily for blisters, cuts, bruises, sores, and redness. If you cannot see the bottom of your feet, use a mirror or ask someone for help.  Do not cut corns or calluses or try to remove them with medicine.  If you  find a minor scrape, cut, or break in the skin on your feet, keep it and the skin around it clean and dry. You may clean these areas with mild soap and water. Do not clean the area with peroxide, alcohol, or iodine.  If you have a wound, scrape, corn, or callus on your foot, look at it several times a day to make sure it is healing and not infected. Check for: ? Redness, swelling, or pain. ? Fluid or blood. ? Warmth. ? Pus or a bad smell. General instructions  Do not cross your legs. This may decrease blood flow to your feet.  Do not use heating pads or hot water bottles on your feet. They may burn your skin. If you have lost feeling in your feet or legs, you may not know this is happening until it is too late.  Protect your feet from hot and cold by wearing shoes, such as at the beach or on hot pavement.  Schedule a complete foot exam at least once a year (annually) or more often if you have foot problems. If you have foot problems, report any cuts, sores, or bruises to your health care provider immediately. Contact a health care provider if:  You have a medical condition that increases your risk of infection and you have any cuts, sores, or bruises on your feet.  You have an injury that is not   healing.  You have redness on your legs or feet.  You feel burning or tingling in your legs or feet.  You have pain or cramps in your legs and feet.  Your legs or feet are numb.  Your feet always feel cold.  You have pain around a toenail. Get help right away if:  You have a wound, scrape, corn, or callus on your foot and: ? You have pain, swelling, or redness that gets worse. ? You have fluid or blood coming from the wound, scrape, corn, or callus. ? Your wound, scrape, corn, or callus feels warm to the touch. ? You have pus or a bad smell coming from the wound, scrape, corn, or callus. ? You have a fever. ? You have a red line going up your leg. Summary  Check your feet every day  for cuts, sores, red spots, swelling, and blisters.  Moisturize feet and legs daily.  Wear shoes that fit properly and have enough cushioning.  If you have foot problems, report any cuts, sores, or bruises to your health care provider immediately.  Schedule a complete foot exam at least once a year (annually) or more often if you have foot problems. This information is not intended to replace advice given to you by your health care provider. Make sure you discuss any questions you have with your health care provider. Document Revised: 10/24/2018 Document Reviewed: 03/04/2016 Elsevier Patient Education  2020 [employer]  

## 2019-03-11 NOTE — Addendum Note (Signed)
Addended by: Wardell Heath on: 03/11/2019 01:19 PM   Modules accepted: Orders

## 2019-03-20 DIAGNOSIS — E119 Type 2 diabetes mellitus without complications: Secondary | ICD-10-CM | POA: Diagnosis not present

## 2019-03-20 DIAGNOSIS — H25813 Combined forms of age-related cataract, bilateral: Secondary | ICD-10-CM | POA: Diagnosis not present

## 2019-03-20 DIAGNOSIS — H40003 Preglaucoma, unspecified, bilateral: Secondary | ICD-10-CM | POA: Diagnosis not present

## 2019-03-29 DIAGNOSIS — F418 Other specified anxiety disorders: Secondary | ICD-10-CM | POA: Diagnosis not present

## 2019-03-29 DIAGNOSIS — E1169 Type 2 diabetes mellitus with other specified complication: Secondary | ICD-10-CM | POA: Diagnosis not present

## 2019-03-29 DIAGNOSIS — M21372 Foot drop, left foot: Secondary | ICD-10-CM | POA: Diagnosis not present

## 2019-03-29 DIAGNOSIS — E785 Hyperlipidemia, unspecified: Secondary | ICD-10-CM | POA: Diagnosis not present

## 2019-03-29 DIAGNOSIS — I2510 Atherosclerotic heart disease of native coronary artery without angina pectoris: Secondary | ICD-10-CM | POA: Diagnosis not present

## 2019-03-29 DIAGNOSIS — I209 Angina pectoris, unspecified: Secondary | ICD-10-CM | POA: Diagnosis not present

## 2019-03-29 DIAGNOSIS — I10 Essential (primary) hypertension: Secondary | ICD-10-CM | POA: Diagnosis not present

## 2019-03-29 DIAGNOSIS — E611 Iron deficiency: Secondary | ICD-10-CM | POA: Diagnosis not present

## 2019-04-05 DIAGNOSIS — Z20828 Contact with and (suspected) exposure to other viral communicable diseases: Secondary | ICD-10-CM | POA: Diagnosis not present

## 2019-04-05 DIAGNOSIS — Z20822 Contact with and (suspected) exposure to covid-19: Secondary | ICD-10-CM | POA: Diagnosis not present

## 2019-05-08 NOTE — Progress Notes (Signed)
CARDIOLOGY OFFICE NOTE  Date:  05/14/2019    [name] Date of Birth: 1945/04/23 Medical Record #[id number]  PCP:  Crist Infante, MD  Cardiologist:  Servando Snare & Cooper/Nahser   Chief Complaint  Patient presents with  . Follow-up    Seen for Dr. Burt Knack & Nahser    History of Present Illness: Gorje Iyer. is a 74 y.o. male who presents today for a follow up visit. Seen for Dr. Burt Knack & Nahser. Primarily sees me.  He has along standinghistory ofCAD, HTN, HLD, DM and OSA. He has had prior DES x 3 to the LAD back in 2015. Several caths since. Ended up being referred for CABG x 5 in September of 2018 by [facility]. He isIntolerant to higher doses of nitrates despite multiple attempts. Hadnot wanted to increase Ranexa due to cost. Has had chronic tendency to really overexert himself. It wassurprising how very well his exercise tolerance is and that his angina is typically always at rest - this is chronic. He has had prior echo showing mild dilatation of the aortabut not identified on repeat studies.  He has had repeat cath since his CABGin November of 2019- to continue with medical management.   He had emergent back surgery in early 2020due to low back, buttock, radicular left leg pain - failed on conservative treatment and had left L5 foraminotomy.We did a telehealth visit back in May - he was doing ok - was getting ready to have lumbar fusion in June at [facility] - this was complicated by post op infection that required a wound vac. Was on chronic narcotics. We have put him back on Ranexa. At last vsiit in December and he was doing notably well.    The patient does not have symptoms concerning for COVID-19 infection (fever, chills, cough, or new shortness of breath).   Comes in today. Here alone. He is doing well. His only concern is his dreams at night - he is hitting [name] - says it is just "crazy". She notes that he will be cussing - sometimes talking in a  different language - but worrisome that he is hitting her. Notes his dreams are very vivid. Some snoring. She will wake him up. He says he had sleep apnea in the past - but lost weight and it improved and no longer uses a mask for the past 20 years or so.  He wonders if it is his medicines. He does not know that he does this. He is otherwise doing ok. No chest pain. Breathing is fine. He is staying active. Seeing PCP later this summer. He has no other concerns.   Past Medical History:  Diagnosis Date  . Arthritis    "lower back; right shoulder; left thumb; joints" (06/21/2013)  . Asthma    "not sure if this is true or not" (06/21/2013)  . CAD (coronary artery disease) 06/2013   s/p PTCA/DES x 3 to mid LAD 2015. b. s/p CABG in 2018. c. cath 12/2017 -> med rx.  [name] GERD (gastroesophageal reflux disease)   . Gout    "maybe twice in my life"  . Headache(784.0)    "weekly for the last 3-4 months" (06/21/2013)  . Hyperlipidemia   . Hypertension   . Myocardial infarction [facility])    "/Dr. Katharina Caper I've had one between 2014-2015) (06/21/2013)  . Osteoarthritis   . Sleep apnea    WEIGHT LOSS, NO LONGER NEEDS PER PATIENT  . Type II diabetes mellitus ([place])  TYPE 2    Past Surgical History:  Procedure Laterality Date  . CARDIAC CATHETERIZATION  1980's   "once"  . CARDIAC CATHETERIZATION N/A 07/23/2015   Procedure: Left Heart Cath and Coronary Angiography;  Surgeon: Sherren Mocha, MD;  Location: [place] [facility];  Service: Cardiovascular;  Laterality: N/A;  . CARDIOVASCULAR STRESS TEST  06/10/2008   EF 68%  . CARPAL TUNNEL RELEASE Bilateral   . CORONARY ANGIOPLASTY WITH STENT PLACEMENT  06/21/2013   "3"  . CORONARY ARTERY BYPASS GRAFT N/A 11/07/2016   Procedure: CORONARY ARTERY BYPASS GRAFTING (CABG) x 5 (LIMA to DISTAL LAD, SVG to DIAGONAL, SVG to CIRCUMFLEX, and SVG SEQUENTIALLY to PLB and DISTAL PDA) with EVH of the RIGHT GREATER SAPHENOUS VEIN and LEFT INTERNAL MAMMARY ARTERY HARVEST;  Surgeon:  Grace Isaac, MD;  Location: [place];  Service: Open Heart Surgery;  Laterality: N/A;  . CORONARY STENT INTERVENTION N/A 06/07/2016   Procedure: Coronary Stent Intervention;  Surgeon: Burnell Blanks, MD;  Location: [place] [facility];  Service: Cardiovascular;  Laterality: N/A;  . EXCISION MORTON'S NEUROMA Left   . EYE SURGERY Left    "removed film over my eye"  . FRACTIONAL FLOW RESERVE WIRE N/A 06/26/2013   Procedure: FRACTIONAL FLOW RESERVE WIRE;  Surgeon: [name], MD;  Location: [place] [facility];  Service: Cardiovascular;  Laterality: N/A;  . HERNIA REPAIR Left   . INTRAVASCULAR PRESSURE WIRE/FFR STUDY N/A 10/28/2016   Procedure: INTRAVASCULAR PRESSURE WIRE/FFR STUDY;  Surgeon: Leonie Man, MD;  Location: [place] [facility];  Service: Cardiovascular;  Laterality: N/A;  . JOINT REPLACEMENT Left 03/2013   "thumb"  . LEFT HEART CATH AND CORONARY ANGIOGRAPHY N/A 06/07/2016   Procedure: Left Heart Cath and Coronary Angiography;  Surgeon: Burnell Blanks, MD;  Location: [place] [facility];  Service: Cardiovascular;  Laterality: N/A;  . LEFT HEART CATH AND CORONARY ANGIOGRAPHY N/A 10/28/2016   Procedure: LEFT HEART CATH AND CORONARY ANGIOGRAPHY;  Surgeon: Leonie Man, MD;  Location: [place] [facility];  Service: Cardiovascular;  Laterality: N/A;  . LEFT HEART CATH AND CORS/GRAFTS ANGIOGRAPHY N/A 12/25/2017   Procedure: LEFT HEART CATH AND CORS/GRAFTS ANGIOGRAPHY;  Surgeon: Sherren Mocha, MD;  Location: [place] [facility];  Service: Cardiovascular;  Laterality: N/A;  . LEFT HEART CATHETERIZATION WITH CORONARY ANGIOGRAM N/A 06/21/2013   Procedure: LEFT HEART CATHETERIZATION WITH CORONARY ANGIOGRAM;  Surgeon: Burnell Blanks, MD;  Location: [facility];  Service: Cardiovascular;  Laterality: N/A;  . LEFT HEART CATHETERIZATION WITH CORONARY ANGIOGRAM N/A 06/26/2013   Procedure: LEFT HEART CATHETERIZATION WITH CORONARY ANGIOGRAM;  Surgeon: [name], MD;   Location: [place] [facility];  Service: Cardiovascular;  Laterality: N/A;  . LUMBAR LAMINECTOMY/DECOMPRESSION MICRODISCECTOMY Left 05/30/2018   Procedure: Left  Gill L5 decompression;  Surgeon: Melina Schools, MD;  Location: [place];  Service: Orthopedics;  Laterality: Left;  187min  . TEE WITHOUT CARDIOVERSION N/A 11/07/2016   Procedure: TRANSESOPHAGEAL ECHOCARDIOGRAM (TEE);  Surgeon: Grace Isaac, MD;  Location: [place];  Service: Open Heart Surgery;  Laterality: N/A;  . TONSILLECTOMY    . TOTAL HIP ARTHROPLASTY Right 04/12/2016   Procedure: RIGHT TOTAL HIP ARTHROPLASTY ANTERIOR APPROACH;  Surgeon: Paralee Cancel, MD;  Location: [facility];  Service: Orthopedics;  Laterality: Right;  requests 70 mins  . TOTAL SHOULDER ARTHROPLASTY  03/01/2012   Procedure: TOTAL SHOULDER ARTHROPLASTY;  Surgeon: Marin Shutter, MD;  Location: [place];  Service: Orthopedics;  Laterality: Right;  .  UMBILICAL HERNIA REPAIR       Medications: Current Meds  Medication Sig  . acetaminophen (TYLENOL) 500 MG tablet Take 1,000 mg by mouth every 8 (eight) hours as needed (pain).   [name] ALPRAZolam (XANAX) 0.5 MG tablet Take 0.25 mg by mouth as needed for anxiety or sleep.  [name] amLODipine (NORVASC) 5 MG tablet TAKE ONE TABLET BY MOUTH TWICE DAILY   . aspirin EC 81 MG tablet Take 81 mg by mouth daily.  [name] atenolol (TENORMIN) 50 MG tablet Take 1 tablet (50 mg total) by mouth at bedtime.  [name] atorvastatin (LIPITOR) 80 MG tablet Take 80 mg by mouth daily at 6 PM.  . canagliflozin (INVOKANA) 300 MG TABS tablet Take 300 mg by mouth daily before breakfast.  . Cholecalciferol (VITAMIN D) 50 MCG (2000 UT) tablet Take 2,000 Units by mouth daily.  . clopidogrel (PLAVIX) 75 MG tablet TAKE ONE TABLET BY MOUTH ONE TIME DAILY breakfast  . docusate sodium (COLACE) 100 MG capsule Take 100 mg by mouth daily as needed for mild constipation.  [name] FREESTYLE TEST STRIPS test strip 1 each by Other route every morning.   . gabapentin (NEURONTIN) 300 MG capsule Take  300 mg by mouth 2 (two) times daily.   . hydrochlorothiazide (HYDRODIURIL) 25 MG tablet Take 25 mg by mouth daily.  . insulin degludec (TRESIBA FLEXTOUCH) 100 UNIT/ML SOPN FlexTouch Pen Inject 18 Units into the skin daily.   . Insulin Glargine, 1 Unit Dial, (TOUJEO SOLOSTAR) 300 UNIT/ML SOPN Inject 18 Units into the skin daily.  . irbesartan (AVAPRO) 300 MG tablet Take 300 mg by mouth daily.  . metFORMIN (GLUCOPHAGE-XR) 500 MG 24 hr tablet Take 500 mg by mouth 2 (two) times daily.  . methocarbamol (ROBAXIN) 500 MG tablet Take 500 mg by mouth every 8 (eight) hours as needed for muscle spasms (back pain).   . Multiple Vitamin (MULTIVITAMIN) tablet Take 1 tablet by mouth daily.  . nitroGLYCERIN (NITROSTAT) 0.4 MG SL tablet DISSOLVE 1 TABLET UNDER TONGUE AS NEEDEDFOR CHEST PAIN. MAY REPEAT 5 MINUTES APART 3 TIMES IF NEEDED  . NONFORMULARY OR COMPOUNDED ITEM Antifungal solution: Terbinafine 3%, Fluconazole 2%, Tea Tree Oil 5%, Urea 10%, Ibuprofen 2% in DMSO suspension #24mL  . pantoprazole (PROTONIX) 40 MG tablet Take 1 tablet (40 mg total) by mouth 2 (two) times daily before a meal.  . polyethylene glycol (MIRALAX / GLYCOLAX) packet Take 17 g by mouth daily as needed for mild constipation.  . promethazine (PHENERGAN) 25 MG tablet Take 12.5 mg by mouth every 6 (six) hours as needed.  . ranolazine (RANEXA) 500 MG 12 hr tablet Take 1 tablet (500 mg total) by mouth 2 (two) times daily.     Allergies: Allergies  Allergen Reactions  . Oxycodone Nausea Only    Social History: The patient  reports that he has never smoked. He has never used smokeless tobacco. He reports that he does not drink alcohol or use drugs.   Family History: The patient's family history includes Coronary artery disease in his cousin; Diabetes type II in his mother; Heart attack in his father; Heart disease in his brother.   Review of Systems: Please see the history of present illness.   All other systems are reviewed and  negative.   Physical Exam: VS:  BP 110/74   Pulse 66   Ht 5\' 9"  (1.753 m)   Wt 195 lb 8 oz (88.7 kg)   SpO2 98%   BMI 28.87 kg/m  .  BMI Body mass index is 28.87 kg/m.  Wt Readings from Last 3 Encounters:  05/14/19 195 lb 8 oz (88.7 kg)  01/28/19 195 lb 1.9 oz (88.5 kg)  12/31/18 191 lb 12.8 oz (87 kg)    General: Pleasant. Well developed, well nourished and in no acute distress.   HEENT: Normal.  Neck: Supple, no JVD, carotid bruits, or masses noted.  Cardiac: Regular rate and rhythm. No murmurs, rubs, or gallops. No edema.  Respiratory:  Lungs are clear to auscultation bilaterally with normal work of breathing.  GI: Soft and nontender.  MS: No deformity or atrophy. Gait and ROM intact.  Skin: Warm and dry. Color is normal.  Neuro:  Strength and sensation are intact and no gross focal deficits noted.  Psych: Alert, appropriate and with normal affect.   LABORATORY DATA:  EKG:  EKG is not ordered today.    Lab Results  Component Value Date   WBC 4.3 05/25/2018   HGB 12.9 (L) 05/30/2018   HCT 38.0 (L) 05/30/2018   PLT 142 (L) 05/25/2018   GLUCOSE 106 (H) 05/30/2018   CHOL 90 12/25/2017   TRIG 89 12/25/2017   HDL 36 (L) 12/25/2017   LDLCALC 36 12/25/2017   ALT 38 12/25/2017   AST 29 12/25/2017   NA 140 05/30/2018   K 4.0 05/30/2018   CL 103 05/25/2018   CREATININE 1.02 05/25/2018   BUN 25 (H) 05/25/2018   CO2 28 05/25/2018   TSH 2.009 06/06/2016   INR 1.23 12/25/2017   HGBA1C 7.9 (H) 05/25/2018       BNP (last 3 results) No results for input(s): BNP in the last 8760 hours.  ProBNP (last 3 results) No results for input(s): PROBNP in the last 8760 hours.   Other Studies Reviewed Today:  LEFT HEART CATH AND CORS/GRAFTS ANGIOGRAPHY11/2019  Conclusion     Ost LM lesion is 40% stenosed.  Dist LAD lesion is 40% stenosed.  1st Diag lesion is 80% stenosed.  2nd Diag lesion is 70% stenosed.  Prox LAD to Dist LAD lesion is 50% stenosed.  Mid  Cx lesion is 60% stenosed.  1st Mrg lesion is 75% stenosed.  Non-stenotic Dist RCA lesion was previously treated.  Prox RCA to Mid RCA lesion is 35% stenosed.  Mid RCA lesion is 40% stenosed.  Ost RPDA to RPDA lesion is 40% stenosed.  RPDA lesion is 85% stenosed.  SVG graft was visualized by angiography and is normal in caliber.  The graft exhibits no disease.  LIMA.  SVG.  Seq SVG-.  1. Patent left mainstem 2. Patent LAD with mild diffuse stenosis, severe diagonal stenosis with a patent graft 3. Severe first obtuse marginal stenosis with a patent graft  4. Patent RCA with mild nonobstructive disease and severe PDA stenosis with patent graft to the PDA and PLA branches 5. Atretic LIMA to LAD, LAD supplied via antegrade native vessel flow as well as retrograde flow from the diagonal branch saphenous vein graft. 6. Normal LVEDP  Recommend ongoing medical therapy. If the patient is chest pain-free this evening, hospital discharge would be reasonable. If recurrent chest pain would observe overnight with serial cardiac markers.      SURGICAL PROCEDURE9/2018:  Coronary artery bypass grafting x5 with the left internal mammary to the distal left anterior descending coronary artery, reverse saphenous vein graft to the diagonal, reverse saphenous vein graft to the first obtuse marginal, sequential reverse saphenous vein graft to the proximal posterior lateral branch and to the distal  posterior descending with right leg, thigh, and calf greater saphenous vein harvesting endoscopically.    ASSESSMENT & PLAN:   1. CAD - has had prior DES x 3 to the LAD, prior DES x 1 to proximal PLA and balloon PCI to proximal PDA - subsequent CABG x 5 with LIMA to distal LAD, SVG to DX, SVG to 1st OM and sequential SVG to proximal posterior lateral and distal PD in 10/2016. His last cath was November of 2019 with stable findings - does have atretic LIMA and is managed  medically. He has had Ranexa added back due to recurrent chest pain with good response. He is currently doing well.   2. HTN - BP looks good - no changes made today with his medicines.   3. HLD - on statin - labs by PCP  4. DM - per PCP  5. Sleep disturbance - I told him to check his sugars during one of these spells - also may need to consider repeat sleep study. I do not think this is from any of our medicines.   6. COVID-19 Education: The signs and symptoms of COVID-19 were discussed with the patient and how to seek care for testing (follow up with PCP or arrange E-visit).  The importance of social distancing, staying at home, hand hygiene and wearing a mask when out in public were discussed today. He has had both vaccines.   Current medicines are reviewed with the patient today.  The patient does not have concerns regarding medicines other than what has been noted above.  The following changes have been made:  See above.  Labs/ tests ordered today include:   No orders of the defined types were placed in this encounter.    Disposition:   FU with [place] in 6 months.      Patient is agreeable to this plan and will call if any problems develop in the interim.   SignedTruitt Merle, NP  05/14/2019 10:46 AM  [place] [address] [place], [place]  [address] Phone: [phone] Fax: [phone]

## 2019-05-14 ENCOUNTER — Encounter: Payer: Self-pay | Admitting: Nurse Practitioner

## 2019-05-14 ENCOUNTER — Other Ambulatory Visit: Payer: Self-pay

## 2019-05-14 ENCOUNTER — Ambulatory Visit: Payer: PPO | Admitting: Nurse Practitioner

## 2019-05-14 VITALS — BP 110/74 | HR 66 | Ht 69.0 in | Wt 195.5 lb

## 2019-05-14 DIAGNOSIS — Z951 Presence of aortocoronary bypass graft: Secondary | ICD-10-CM | POA: Diagnosis not present

## 2019-05-14 DIAGNOSIS — I259 Chronic ischemic heart disease, unspecified: Secondary | ICD-10-CM

## 2019-05-14 DIAGNOSIS — E785 Hyperlipidemia, unspecified: Secondary | ICD-10-CM

## 2019-05-14 DIAGNOSIS — Z7189 Other specified counseling: Secondary | ICD-10-CM

## 2019-05-14 DIAGNOSIS — I10 Essential (primary) hypertension: Secondary | ICD-10-CM | POA: Diagnosis not present

## 2019-05-14 NOTE — Patient Instructions (Addendum)

## 2019-06-10 ENCOUNTER — Other Ambulatory Visit: Payer: Self-pay

## 2019-06-10 ENCOUNTER — Encounter: Payer: Self-pay | Admitting: Podiatry

## 2019-06-10 ENCOUNTER — Ambulatory Visit: Payer: PPO | Admitting: Podiatry

## 2019-06-10 VITALS — Temp 98.1°F

## 2019-06-10 DIAGNOSIS — B351 Tinea unguium: Secondary | ICD-10-CM | POA: Diagnosis not present

## 2019-06-10 DIAGNOSIS — M79676 Pain in unspecified toe(s): Secondary | ICD-10-CM | POA: Diagnosis not present

## 2019-06-10 DIAGNOSIS — E1142 Type 2 diabetes mellitus with diabetic polyneuropathy: Secondary | ICD-10-CM

## 2019-06-10 NOTE — Patient Instructions (Signed)
Diabetes Mellitus and Foot Care Foot care is an important part of your health, especially when you have diabetes. Diabetes may cause you to have problems because of poor blood flow (circulation) to your feet and legs, which can cause your skin to:  Become thinner and drier.  Break more easily.  Heal more slowly.  Peel and crack. You may also have nerve damage (neuropathy) in your legs and feet, causing decreased feeling in them. This means that you may not notice minor injuries to your feet that could lead to more serious problems. Noticing and addressing any potential problems early is the best way to prevent future foot problems. How to care for your feet Foot hygiene  Wash your feet daily with warm water and mild soap. Do not use hot water. Then, pat your feet and the areas between your toes until they are completely dry. Do not soak your feet as this can dry your skin.  Trim your toenails straight across. Do not dig under them or around the cuticle. File the edges of your nails with an emery board or nail file.  Apply a moisturizing lotion or petroleum jelly to the skin on your feet and to dry, brittle toenails. Use lotion that does not contain alcohol and is unscented. Do not apply lotion between your toes. Shoes and socks  Wear clean socks or stockings every day. Make sure they are not too tight. Do not wear knee-high stockings since they may decrease blood flow to your legs.  Wear shoes that fit properly and have enough cushioning. Always look in your shoes before you put them on to be sure there are no objects inside.  To break in new shoes, wear them for just a few hours a day. This prevents injuries on your feet. Wounds, scrapes, corns, and calluses  Check your feet daily for blisters, cuts, bruises, sores, and redness. If you cannot see the bottom of your feet, use a mirror or ask someone for help.  Do not cut corns or calluses or try to remove them with medicine.  If you  find a minor scrape, cut, or break in the skin on your feet, keep it and the skin around it clean and dry. You may clean these areas with mild soap and water. Do not clean the area with peroxide, alcohol, or iodine.  If you have a wound, scrape, corn, or callus on your foot, look at it several times a day to make sure it is healing and not infected. Check for: ? Redness, swelling, or pain. ? Fluid or blood. ? Warmth. ? Pus or a bad smell. General instructions  Do not cross your legs. This may decrease blood flow to your feet.  Do not use heating pads or hot water bottles on your feet. They may burn your skin. If you have lost feeling in your feet or legs, you may not know this is happening until it is too late.  Protect your feet from hot and cold by wearing shoes, such as at the beach or on hot pavement.  Schedule a complete foot exam at least once a year (annually) or more often if you have foot problems. If you have foot problems, report any cuts, sores, or bruises to your health care provider immediately. Contact a health care provider if:  You have a medical condition that increases your risk of infection and you have any cuts, sores, or bruises on your feet.  You have an injury that is not   healing.  You have redness on your legs or feet.  You feel burning or tingling in your legs or feet.  You have pain or cramps in your legs and feet.  Your legs or feet are numb.  Your feet always feel cold.  You have pain around a toenail. Get help right away if:  You have a wound, scrape, corn, or callus on your foot and: ? You have pain, swelling, or redness that gets worse. ? You have fluid or blood coming from the wound, scrape, corn, or callus. ? Your wound, scrape, corn, or callus feels warm to the touch. ? You have pus or a bad smell coming from the wound, scrape, corn, or callus. ? You have a fever. ? You have a red line going up your leg. Summary  Check your feet every day  for cuts, sores, red spots, swelling, and blisters.  Moisturize feet and legs daily.  Wear shoes that fit properly and have enough cushioning.  If you have foot problems, report any cuts, sores, or bruises to your health care provider immediately.  Schedule a complete foot exam at least once a year (annually) or more often if you have foot problems. This information is not intended to replace advice given to you by your health care provider. Make sure you discuss any questions you have with your health care provider. Document Revised: 10/24/2018 Document Reviewed: 03/04/2016 Elsevier Patient Education  2020 [employer]  Peripheral Neuropathy Peripheral neuropathy is a type of nerve damage. It affects nerves that carry signals between the spinal cord and the arms, legs, and the rest of the body (peripheral nerves). It does not affect nerves in the spinal cord or brain. In peripheral neuropathy, one nerve or a group of nerves may be damaged. Peripheral neuropathy is a broad category that includes many specific nerve disorders, like diabetic neuropathy, hereditary neuropathy, and carpal tunnel syndrome. What are the causes? This condition may be caused by:  Diabetes. This is the most common cause of peripheral neuropathy.  Nerve injury.  Pressure or stress on a nerve that lasts a Loa time.  Lack (deficiency) of B vitamins. This can result from alcoholism, poor diet, or a restricted diet.  Infections.  Autoimmune diseases, such as rheumatoid arthritis and systemic lupus erythematosus.  Nerve diseases that are passed from parent to child (inherited).  Some medicines, such as cancer medicines (chemotherapy).  Poisonous (toxic) substances, such as lead and mercury.  Too little blood flowing to the legs.  Kidney disease.  Thyroid disease. In some cases, the cause of this condition is not known. What are the signs or symptoms? Symptoms of this condition depend on which of your  nerves is damaged. Common symptoms include:  Loss of feeling (numbness) in the feet, hands, or both.  Tingling in the feet, hands, or both.  Burning pain.  Very sensitive skin.  Weakness.  Not being able to move a part of the body (paralysis).  Muscle twitching.  Clumsiness or poor coordination.  Loss of balance.  Not being able to control your bladder.  Feeling dizzy.  Sexual problems. How is this diagnosed? Diagnosing and finding the cause of peripheral neuropathy can be difficult. Your health care provider will take your medical history and do a physical exam. A neurological exam will also be done. This involves checking things that are affected by your brain, spinal cord, and nerves (nervous system). For example, your health care provider will check your reflexes, how you move, and   what you can feel. You may have other tests, such as:  Blood tests.  Electromyogram (EMG) and nerve conduction tests. These tests check nerve function and how well the nerves are controlling the muscles.  Imaging tests, such as CT scans or MRI to rule out other causes of your symptoms.  Removing a small piece of nerve to be examined in a lab (nerve biopsy). This is rare.  Removing and examining a small amount of the fluid that surrounds the brain and spinal cord (lumbar puncture). This is rare. How is this treated? Treatment for this condition may involve:  Treating the underlying cause of the neuropathy, such as diabetes, kidney disease, or vitamin deficiencies.  Stopping medicines that can cause neuropathy, such as chemotherapy.  Medicine to relieve pain. Medicines may include: ? Prescription or over-the-counter pain medicine. ? Antiseizure medicine. ? Antidepressants. ? Pain-relieving patches that are applied to painful areas of skin.  Surgery to relieve pressure on a nerve or to destroy a nerve that is causing pain.  Physical therapy to help improve movement and  balance.  Devices to help you move around (assistive devices). Follow these instructions at home: Medicines  Take over-the-counter and prescription medicines only as told by your health care provider. Do not take any other medicines without first asking your health care provider.  Do not drive or use heavy machinery while taking prescription pain medicine. Lifestyle   Do not use any products that contain nicotine or tobacco, such as cigarettes and e-cigarettes. Smoking keeps blood from reaching damaged nerves. If you need help quitting, ask your health care provider.  Avoid or limit alcohol. Too much alcohol can cause a vitamin B deficiency, and vitamin B is needed for healthy nerves.  Eat a healthy diet. This includes: ? Eating foods that are high in fiber, such as fresh fruits and vegetables, whole grains, and beans. ? Limiting foods that are high in fat and processed sugars, such as fried or sweet foods. General instructions   If you have diabetes, work closely with your health care provider to keep your blood sugar under control.  If you have numbness in your feet: ? Check every day for signs of injury or infection. Watch for redness, warmth, and swelling. ? Wear padded socks and comfortable shoes. These help protect your feet.  Develop a good support system. Living with peripheral neuropathy can be stressful. Consider talking with a mental health specialist or joining a support group.  Use assistive devices and attend physical therapy as told by your health care provider. This may include using a walker or a cane.  Keep all follow-up visits as told by your health care provider. This is important. Contact a health care provider if:  You have new signs or symptoms of peripheral neuropathy.  You are struggling emotionally from dealing with peripheral neuropathy.  Your pain is not well-controlled. Get help right away if:  You have an injury or infection that is not healing  normally.  You develop new weakness in an arm or leg.  You fall frequently. Summary  Peripheral neuropathy is when the nerves in the arms, or legs are damaged, resulting in numbness, weakness, or pain.  There are many causes of peripheral neuropathy, including diabetes, pinched nerves, vitamin deficiencies, autoimmune disease, and hereditary conditions.  Diagnosing and finding the cause of peripheral neuropathy can be difficult. Your health care provider will take your medical history, do a physical exam, and do tests, including blood tests and nerve function tests.    Treatment involves treating the underlying cause of the neuropathy and taking medicines to help control pain. Physical therapy and assistive devices may also help. This information is not intended to replace advice given to you by your health care provider. Make sure you discuss any questions you have with your health care provider. Document Revised: 01/13/2017 Document Reviewed: 04/11/2016 Elsevier Patient Education  2020 [employer]  

## 2019-06-10 NOTE — Progress Notes (Signed)
Subjective: Hunter Mcbride. presents today for follow up of preventative diabetic foot care and painful mycotic nails b/l that are difficult to trim. Pain interferes with ambulation. Aggravating factors include wearing enclosed shoe gear. Pain is relieved with periodic professional debridement.   Allergies  Allergen Reactions  . Oxycodone Nausea Only     Objective: Vitals:   06/10/19 0915  Temp: 98.1 F (36.7 C)    Pt 74 y.o. year old male  in NAD. AAO x 3.   Vascular Examination:  Capillary refill time to digits immediate b/l. Palpable DP pulses b/l. Palpable PT pulses b/l. Pedal hair present b/l. Skin temperature gradient within normal limits b/l. No edema noted b/l.  Dermatological Examination: Pedal skin with normal turgor, texture and tone bilaterally. No open wounds bilaterally. No interdigital macerations bilaterally. Toenails 1-5 b/l elongated, dystrophic, thickened, crumbly with subungual debris and tenderness to dorsal palpation.  Musculoskeletal: Normal muscle strength 5/5 to all lower extremity muscle groups bilaterally. No pain crepitus or joint limitation noted with ROM b/l. Hammertoes noted to the 2-5 bilaterally.  Neurological: Protective sensation decreased with 10 gram monofilament b/l. Proprioception intact bilaterally. Babinski reflex negative b/l. Clonus negative b/l.  Assessment: 1. Pain due to onychomycosis of toenail   2. Diabetic peripheral neuropathy associated with type 2 diabetes mellitus ([place])    Plan: -Continue diabetic foot care principles. Literature dispensed on today.  -Toenails 1-5 b/l were debrided in length and girth with sterile nail nippers and dremel without iatrogenic bleeding.  -Patient to continue soft, supportive shoe gear daily. -Patient to report any pedal injuries to medical professional immediately. -Patient/POA to call should there be question/concern in the interim.  Return in about 3 months (around 09/09/2019) for diabetic  nail trim.

## 2019-07-12 DIAGNOSIS — J45909 Unspecified asthma, uncomplicated: Secondary | ICD-10-CM | POA: Diagnosis not present

## 2019-07-12 DIAGNOSIS — E1169 Type 2 diabetes mellitus with other specified complication: Secondary | ICD-10-CM | POA: Diagnosis not present

## 2019-07-12 DIAGNOSIS — K219 Gastro-esophageal reflux disease without esophagitis: Secondary | ICD-10-CM | POA: Diagnosis not present

## 2019-07-12 DIAGNOSIS — Z1389 Encounter for screening for other disorder: Secondary | ICD-10-CM | POA: Diagnosis not present

## 2019-07-12 DIAGNOSIS — I209 Angina pectoris, unspecified: Secondary | ICD-10-CM | POA: Diagnosis not present

## 2019-07-12 DIAGNOSIS — E7849 Other hyperlipidemia: Secondary | ICD-10-CM | POA: Diagnosis not present

## 2019-07-12 DIAGNOSIS — F418 Other specified anxiety disorders: Secondary | ICD-10-CM | POA: Diagnosis not present

## 2019-07-12 DIAGNOSIS — I10 Essential (primary) hypertension: Secondary | ICD-10-CM | POA: Diagnosis not present

## 2019-07-12 DIAGNOSIS — I2510 Atherosclerotic heart disease of native coronary artery without angina pectoris: Secondary | ICD-10-CM | POA: Diagnosis not present

## 2019-07-23 ENCOUNTER — Telehealth: Payer: Self-pay | Admitting: Nurse Practitioner

## 2019-07-23 NOTE — Telephone Encounter (Signed)
Hunter Mcbride has had several episodes of CP over the last few days that are concerning to him. He states they feel similar to when he had CABG and when he had stents. Saturday was the worst - he had a "really bad" pain that started in his chest and radiated around to his back.  Since, he's had intermittent mild, dull pain in the same areas. He has taken NTG and the pain resolves. He has also had intermittent nausea for a couple days as well. He currently feels fine and is asymptomatic.   Will discuss with Dr. Acie Fredrickson tomorrow and make a plan for the patient as clinic availability is sparse.  ER precautions reviewed. He understands to seek emergent medical attention id CP does not respond to NTG or symptoms worsen.

## 2019-07-23 NOTE — Telephone Encounter (Signed)
Spoke with pt's wife Hunter Mcbride, [place] as pt is not currently home.  Pt's wife reports pt has been having occasional chest discomfort x 1 week that is relieved by nitro.  Pt's wife states pt has hx of CAD and angina. She reports pt has had no CP today but yesterday had discomfort along with some nausea. Pt's wife is very concerned as they are scheduled to go out of town at the end of the month.  Attempted to schedule pt to be seen in office but no open availability.   Will forward information to Dr Burt Knack and RN for review and recommendation.  Advised pt's wife if pt develops active CP, SOB or other symptoms to have pt go directly to ED for evaluation.  Pt's wife verbalizes understanding and agrees with current plan.

## 2019-07-23 NOTE — Telephone Encounter (Signed)
New message   Pt c/o of Chest Pain: STAT if CP now or developed within 24 hours  1. Are you having CP right now?no   2. Are you experiencing any other symptoms (ex. SOB, nausea, vomiting, sweating)? No   3. How [place] have you been experiencing CP? Per patient's wife has been going on for the last week   4. Is your CP continuous or coming and going? Coming and going   5. Have you taken Nitroglycerin? Yes  ?

## 2019-07-24 NOTE — Telephone Encounter (Signed)
Lets work him in tomorrow at [facility] am

## 2019-07-24 NOTE — Telephone Encounter (Signed)
Spoke with the patient's wife.  She understands he is scheduled for visit with Dr. Acie Fredrickson tomorrow.  ER precautions reiterated. She was grateful for assistance.

## 2019-07-24 NOTE — Telephone Encounter (Signed)
Pt well-known to me. Drab hx of intermittent CP. Agree with plan to review with Dr Acie Fredrickson - let me know if I can help.

## 2019-07-25 ENCOUNTER — Ambulatory Visit: Payer: PPO | Admitting: Cardiovascular Disease

## 2019-07-25 ENCOUNTER — Inpatient Hospital Stay (HOSPITAL_COMMUNITY)
Admission: EM | Admit: 2019-07-25 | Discharge: 2019-07-26 | DRG: 287 | Disposition: A | Discharge status: Home / Self Care | Payer: PPO | Source: Ambulatory Visit | Attending: Cardiovascular Disease | Admitting: Cardiovascular Disease

## 2019-07-25 ENCOUNTER — Encounter (HOSPITAL_COMMUNITY): Payer: Self-pay | Admitting: Cardiovascular Disease

## 2019-07-25 ENCOUNTER — Other Ambulatory Visit: Payer: Self-pay

## 2019-07-25 ENCOUNTER — Encounter: Payer: Self-pay | Admitting: Cardiovascular Disease

## 2019-07-25 VITALS — BP 110/64 | HR 64 | Ht 69.0 in | Wt 191.0 lb

## 2019-07-25 DIAGNOSIS — E119 Type 2 diabetes mellitus without complications: Secondary | ICD-10-CM | POA: Diagnosis not present

## 2019-07-25 DIAGNOSIS — Z7982 Long term (current) use of aspirin: Secondary | ICD-10-CM | POA: Diagnosis not present

## 2019-07-25 DIAGNOSIS — I10 Essential (primary) hypertension: Secondary | ICD-10-CM | POA: Diagnosis not present

## 2019-07-25 DIAGNOSIS — I2510 Atherosclerotic heart disease of native coronary artery without angina pectoris: Secondary | ICD-10-CM | POA: Diagnosis present

## 2019-07-25 DIAGNOSIS — Z951 Presence of aortocoronary bypass graft: Secondary | ICD-10-CM | POA: Diagnosis not present

## 2019-07-25 DIAGNOSIS — G4730 Sleep apnea, unspecified: Secondary | ICD-10-CM | POA: Diagnosis not present

## 2019-07-25 DIAGNOSIS — I25110 Atherosclerotic heart disease of native coronary artery with unstable angina pectoris: Secondary | ICD-10-CM | POA: Diagnosis not present

## 2019-07-25 DIAGNOSIS — Z8249 Family history of ischemic heart disease and other diseases of the circulatory system: Secondary | ICD-10-CM | POA: Diagnosis not present

## 2019-07-25 DIAGNOSIS — I200 Unstable angina: Secondary | ICD-10-CM

## 2019-07-25 DIAGNOSIS — Z833 Family history of diabetes mellitus: Secondary | ICD-10-CM | POA: Diagnosis not present

## 2019-07-25 DIAGNOSIS — Z96692 Finger-joint replacement of left hand: Secondary | ICD-10-CM | POA: Diagnosis present

## 2019-07-25 DIAGNOSIS — Z96641 Presence of right artificial hip joint: Secondary | ICD-10-CM | POA: Diagnosis present

## 2019-07-25 DIAGNOSIS — Z96611 Presence of right artificial shoulder joint: Secondary | ICD-10-CM | POA: Diagnosis not present

## 2019-07-25 DIAGNOSIS — Z885 Allergy status to narcotic agent status: Secondary | ICD-10-CM | POA: Diagnosis not present

## 2019-07-25 DIAGNOSIS — Z7902 Long term (current) use of antithrombotics/antiplatelets: Secondary | ICD-10-CM

## 2019-07-25 DIAGNOSIS — I951 Orthostatic hypotension: Secondary | ICD-10-CM | POA: Diagnosis not present

## 2019-07-25 DIAGNOSIS — M109 Gout, unspecified: Secondary | ICD-10-CM | POA: Diagnosis present

## 2019-07-25 DIAGNOSIS — K219 Gastro-esophageal reflux disease without esophagitis: Secondary | ICD-10-CM | POA: Diagnosis not present

## 2019-07-25 DIAGNOSIS — Z794 Long term (current) use of insulin: Secondary | ICD-10-CM | POA: Diagnosis not present

## 2019-07-25 DIAGNOSIS — I252 Old myocardial infarction: Secondary | ICD-10-CM | POA: Diagnosis not present

## 2019-07-25 DIAGNOSIS — I25710 Atherosclerosis of autologous vein coronary artery bypass graft(s) with unstable angina pectoris: Secondary | ICD-10-CM | POA: Diagnosis not present

## 2019-07-25 DIAGNOSIS — Z79899 Other long term (current) drug therapy: Secondary | ICD-10-CM | POA: Diagnosis not present

## 2019-07-25 DIAGNOSIS — E785 Hyperlipidemia, unspecified: Secondary | ICD-10-CM | POA: Diagnosis not present

## 2019-07-25 DIAGNOSIS — Z9889 Other specified postprocedural states: Secondary | ICD-10-CM

## 2019-07-25 DIAGNOSIS — Z20822 Contact with and (suspected) exposure to covid-19: Secondary | ICD-10-CM | POA: Diagnosis present

## 2019-07-25 LAB — COMPREHENSIVE METABOLIC PANEL
ALT: 35 U/L (ref 0–44)
AST: 23 U/L (ref 15–41)
Albumin: 3.9 g/dL (ref 3.5–5.0)
Alkaline Phosphatase: 67 U/L (ref 38–126)
Anion gap: 9 (ref 5–15)
BUN: 23 mg/dL (ref 8–23)
CO2: 25 mmol/L (ref 22–32)
Calcium: 9.5 mg/dL (ref 8.9–10.3)
Chloride: 106 mmol/L (ref 98–111)
Creatinine, Ser: 1.2 mg/dL (ref 0.61–1.24)
GFR calc Af Amer: >60 mL/min (ref >60)
GFR calc non Af Amer: >60 mL/min (ref >60)
Glucose, Bld: 140 mg/dL — ABNORMAL HIGH (ref 70–99)
Potassium: 3.8 mmol/L (ref 3.5–5.1)
Sodium: 140 mmol/L (ref 135–145)
Total Bilirubin: 0.8 mg/dL (ref 0.3–1.2)
Total Protein: 6.3 g/dL — ABNORMAL LOW (ref 6.5–8.1)

## 2019-07-25 LAB — CBC WITH DIFFERENTIAL/PLATELET
Abs Immature Granulocytes: 0 10*3/uL (ref 0.00–0.07)
Basophils Absolute: 0 10*3/uL (ref 0.0–0.1)
Basophils Relative: 0 %
Eosinophils Absolute: 0 10*3/uL (ref 0.0–0.5)
Eosinophils Relative: 1 %
HCT: 41.8 % (ref 39.0–52.0)
Hemoglobin: 14.2 g/dL (ref 13.0–17.0)
Immature Granulocytes: 0 %
Lymphocytes Relative: 41 %
Lymphs Abs: 1.5 10*3/uL (ref 0.7–4.0)
MCH: 30.7 pg (ref 26.0–34.0)
MCHC: 34 g/dL (ref 30.0–36.0)
MCV: 90.5 fL (ref 80.0–100.0)
Monocytes Absolute: 0.3 10*3/uL (ref 0.1–1.0)
Monocytes Relative: 8 %
Neutro Abs: 1.8 10*3/uL (ref 1.7–7.7)
Neutrophils Relative %: 50 %
Platelets: 121 10*3/uL — ABNORMAL LOW (ref 150–400)
RBC: 4.62 MIL/uL (ref 4.22–5.81)
RDW: 13.1 % (ref 11.5–15.5)
WBC: 3.7 10*3/uL — ABNORMAL LOW (ref 4.0–10.5)
nRBC: 0 % (ref 0.0–0.2)

## 2019-07-25 LAB — GLUCOSE, CAPILLARY
Glucose-Capillary: 151 mg/dL — ABNORMAL HIGH (ref 70–99)
Glucose-Capillary: 151 mg/dL — ABNORMAL HIGH (ref 70–99)

## 2019-07-25 LAB — HEMOGLOBIN A1C
Hgb A1c MFr Bld: 7.3 % — ABNORMAL HIGH (ref 4.8–5.6)
Mean Plasma Glucose: 162.81 mg/dL

## 2019-07-25 LAB — PROTIME-INR
INR: 1 (ref 0.8–1.2)
Prothrombin Time: 12.9 seconds (ref 11.4–15.2)

## 2019-07-25 LAB — T4, FREE: Free T4: 0.74 ng/dL (ref 0.61–1.12)

## 2019-07-25 LAB — TROPONIN I (HIGH SENSITIVITY)
Troponin I (High Sensitivity): 5 ng/L (ref <18)
Troponin I (High Sensitivity): 4 ng/L (ref <18)

## 2019-07-25 LAB — TSH: TSH: 1.532 u[IU]/mL (ref 0.350–4.500)

## 2019-07-25 LAB — SARS CORONAVIRUS 2 BY RT PCR (HOSPITAL ORDER, PERFORMED IN ~~LOC~~ HOSPITAL LAB): SARS Coronavirus 2: NEGATIVE

## 2019-07-25 LAB — MAGNESIUM: Magnesium: 2.2 mg/dL (ref 1.7–2.4)

## 2019-07-25 MED ORDER — SODIUM CHLORIDE 0.9 % WEIGHT BASED INFUSION
1.0000 mL/kg/h | INTRAVENOUS | Status: DC
  Administered 2019-07-26: 1 mL/kg/h via INTRAVENOUS

## 2019-07-25 MED ORDER — INSULIN ASPART 100 UNIT/ML ~~LOC~~ SOLN: 0.0000 [IU] | Freq: Every day | SUBCUTANEOUS | Status: DC

## 2019-07-25 MED ORDER — VITAMIN D 25 MCG (1000 UNIT) PO TABS
2000.0000 [IU] | ORAL_TABLET | Freq: Every day | ORAL | Status: DC
  Administered 2019-07-26: 2000 [IU] via ORAL
  Filled 2019-07-25 (×2): qty 2

## 2019-07-25 MED ORDER — CANAGLIFLOZIN 300 MG PO TABS
300.0000 mg | ORAL_TABLET | Freq: Every day | ORAL | Status: DC
  Administered 2019-07-26: 300 mg via ORAL
  Filled 2019-07-25 (×2): qty 1

## 2019-07-25 MED ORDER — HEPARIN BOLUS VIA INFUSION
4000.0000 [IU] | Freq: Once | INTRAVENOUS | Status: AC
  Administered 2019-07-25: 4000 [IU] via INTRAVENOUS
  Filled 2019-07-25: qty 4000

## 2019-07-25 MED ORDER — SODIUM CHLORIDE 0.9 % IV SOLN: 250.0000 mL | INTRAVENOUS | Status: DC | PRN

## 2019-07-25 MED ORDER — ZOLPIDEM TARTRATE 5 MG PO TABS: 5.0000 mg | ORAL_TABLET | Freq: Every evening | ORAL | Status: DC | PRN

## 2019-07-25 MED ORDER — ASPIRIN 300 MG RE SUPP
300.0000 mg | RECTAL | Status: AC
  Filled 2019-07-25: qty 1

## 2019-07-25 MED ORDER — DOCUSATE SODIUM 100 MG PO CAPS
100.0000 mg | ORAL_CAPSULE | Freq: Every day | ORAL | Status: DC
  Administered 2019-07-25: 100 mg via ORAL
  Filled 2019-07-25: qty 1

## 2019-07-25 MED ORDER — SODIUM CHLORIDE 0.9 % IV SOLN: INTRAVENOUS | Status: DC

## 2019-07-25 MED ORDER — ONDANSETRON HCL 4 MG/2ML IJ SOLN: 4.0000 mg | Freq: Four times a day (QID) | INTRAMUSCULAR | Status: DC | PRN

## 2019-07-25 MED ORDER — PANTOPRAZOLE SODIUM 40 MG PO TBEC
40.0000 mg | DELAYED_RELEASE_TABLET | Freq: Two times a day (BID) | ORAL | Status: DC
  Administered 2019-07-25 – 2019-07-26 (×3): 40 mg via ORAL
  Filled 2019-07-25 (×4): qty 1

## 2019-07-25 MED ORDER — PROMETHAZINE HCL 25 MG PO TABS
12.5000 mg | ORAL_TABLET | Freq: Four times a day (QID) | ORAL | Status: DC | PRN
  Administered 2019-07-26: 12.5 mg via ORAL
  Filled 2019-07-25: qty 1

## 2019-07-25 MED ORDER — NITROGLYCERIN 0.4 MG SL SUBL: 0.4000 mg | SUBLINGUAL_TABLET | SUBLINGUAL | Status: DC | PRN

## 2019-07-25 MED ORDER — HEPARIN (PORCINE) 25000 UT/250ML-% IV SOLN
1150.0000 [IU]/h | INTRAVENOUS | Status: DC
  Administered 2019-07-25: 1050 [IU]/h via INTRAVENOUS
  Filled 2019-07-25: qty 250

## 2019-07-25 MED ORDER — ALPRAZOLAM 0.25 MG PO TABS: 0.2500 mg | ORAL_TABLET | Freq: Two times a day (BID) | ORAL | Status: DC | PRN

## 2019-07-25 MED ORDER — ACETAMINOPHEN 325 MG PO TABS
650.0000 mg | ORAL_TABLET | ORAL | Status: DC | PRN
  Administered 2019-07-25: 650 mg via ORAL
  Filled 2019-07-25: qty 2

## 2019-07-25 MED ORDER — SODIUM CHLORIDE 0.9% FLUSH
3.0000 mL | INTRAVENOUS | Status: DC | PRN
  Administered 2019-07-25: 3 mL via INTRAVENOUS

## 2019-07-25 MED ORDER — CLOPIDOGREL BISULFATE 75 MG PO TABS
75.0000 mg | ORAL_TABLET | Freq: Every day | ORAL | Status: DC
  Administered 2019-07-26: 75 mg via ORAL
  Filled 2019-07-25 (×2): qty 1

## 2019-07-25 MED ORDER — POLYETHYLENE GLYCOL 3350 17 G PO PACK: 17.0000 g | PACK | Freq: Every day | ORAL | Status: DC | PRN

## 2019-07-25 MED ORDER — ASPIRIN EC 81 MG PO TBEC
81.0000 mg | DELAYED_RELEASE_TABLET | Freq: Every day | ORAL | Status: DC
  Filled 2019-07-25 (×2): qty 1

## 2019-07-25 MED ORDER — ACETAMINOPHEN 500 MG PO TABS: 1000.0000 mg | ORAL_TABLET | Freq: Three times a day (TID) | ORAL | Status: DC | PRN

## 2019-07-25 MED ORDER — SODIUM CHLORIDE 0.9% FLUSH
3.0000 mL | Freq: Two times a day (BID) | INTRAVENOUS | Status: DC
  Administered 2019-07-25: 3 mL via INTRAVENOUS

## 2019-07-25 MED ORDER — AMLODIPINE BESYLATE 5 MG PO TABS
5.0000 mg | ORAL_TABLET | Freq: Two times a day (BID) | ORAL | Status: DC
  Administered 2019-07-25 – 2019-07-26 (×2): 5 mg via ORAL
  Filled 2019-07-25 (×3): qty 1

## 2019-07-25 MED ORDER — HYDROCHLOROTHIAZIDE 25 MG PO TABS
25.0000 mg | ORAL_TABLET | Freq: Every day | ORAL | Status: DC
  Administered 2019-07-26: 25 mg via ORAL
  Filled 2019-07-25 (×2): qty 1

## 2019-07-25 MED ORDER — ATENOLOL 50 MG PO TABS
50.0000 mg | ORAL_TABLET | Freq: Every day | ORAL | Status: DC
  Administered 2019-07-25: 50 mg via ORAL
  Filled 2019-07-25 (×2): qty 1

## 2019-07-25 MED ORDER — RANOLAZINE ER 500 MG PO TB12
500.0000 mg | ORAL_TABLET | Freq: Two times a day (BID) | ORAL | Status: DC
  Administered 2019-07-25 – 2019-07-26 (×2): 500 mg via ORAL
  Filled 2019-07-25 (×2): qty 1

## 2019-07-25 MED ORDER — ALPRAZOLAM 0.5 MG PO TABS
0.5000 mg | ORAL_TABLET | Freq: Every evening | ORAL | Status: DC | PRN
  Administered 2019-07-25: 0.5 mg via ORAL
  Filled 2019-07-25: qty 1

## 2019-07-25 MED ORDER — ADULT MULTIVITAMIN W/MINERALS CH
1.0000 | ORAL_TABLET | Freq: Every day | ORAL | Status: DC
  Administered 2019-07-25 – 2019-07-26 (×2): 1 via ORAL
  Filled 2019-07-25 (×2): qty 1

## 2019-07-25 MED ORDER — IRBESARTAN 300 MG PO TABS
300.0000 mg | ORAL_TABLET | Freq: Every day | ORAL | Status: DC
  Administered 2019-07-25: 300 mg via ORAL
  Filled 2019-07-25: qty 1

## 2019-07-25 MED ORDER — INSULIN ASPART 100 UNIT/ML ~~LOC~~ SOLN: 0.0000 [IU] | Freq: Three times a day (TID) | SUBCUTANEOUS | Status: DC

## 2019-07-25 MED ORDER — GABAPENTIN 300 MG PO CAPS
300.0000 mg | ORAL_CAPSULE | Freq: Every day | ORAL | Status: DC
  Administered 2019-07-25: 300 mg via ORAL
  Filled 2019-07-25: qty 1

## 2019-07-25 MED ORDER — ASPIRIN 81 MG PO CHEW
81.0000 mg | CHEWABLE_TABLET | ORAL | Status: AC
  Administered 2019-07-26: 81 mg via ORAL
  Filled 2019-07-25: qty 1

## 2019-07-25 MED ORDER — ASPIRIN 81 MG PO CHEW
324.0000 mg | CHEWABLE_TABLET | ORAL | Status: AC
  Filled 2019-07-25: qty 4

## 2019-07-25 MED ORDER — SODIUM CHLORIDE 0.9 % WEIGHT BASED INFUSION
3.0000 mL/kg/h | INTRAVENOUS | Status: DC
  Administered 2019-07-26: 3 mL/kg/h via INTRAVENOUS

## 2019-07-25 MED ORDER — NITROGLYCERIN IN D5W 200-5 MCG/ML-% IV SOLN
0.0000 ug/min | INTRAVENOUS | Status: DC
  Administered 2019-07-25: 5 ug/min via INTRAVENOUS
  Filled 2019-07-25: qty 250

## 2019-07-25 MED ORDER — ATORVASTATIN CALCIUM 80 MG PO TABS
80.0000 mg | ORAL_TABLET | Freq: Every day | ORAL | Status: DC
  Administered 2019-07-25: 80 mg via ORAL
  Filled 2019-07-25: qty 1

## 2019-07-25 MED ORDER — ASPIRIN EC 81 MG PO TBEC: 81.0000 mg | DELAYED_RELEASE_TABLET | Freq: Every day | ORAL | Status: DC

## 2019-07-25 NOTE — Progress Notes (Signed)
Beeped MD for orders patient is a direct admit from the office. Wife at bedside. Patient A&O times 4,ambulatory with gait steady.reports admitted due to back,chest pain with radiation to left arm on his left side. EKG performed. Vs stable. No distress noted.

## 2019-07-25 NOTE — Progress Notes (Signed)
Hunter Mcbride. Date of Birth  [birth year] [place]  [address]    [place]   [place], [place]  [address]    [place], [place]  [address] [phone]  Fax  [phone]  [phone]  Fax [phone]  Problem list: 1. History of chest pain-negative stress Myoview study in April, 2010 2. Hyperlipidemia 3. Hypertension 4. Diabetes mellitus 5. Sleep apnea   Hunter Mcbride is doing well.  His quite active. 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 He's not had any episodes of chest pain, shortness breath, syncope, or presyncope.  06/05/2013:    Hunter Mcbride is doing well.  He has been [place] this am.   No CP or dyspnea.  He has noticed that he has some mild vague chest and arm ache - especially with walking.  He can usually walk through the discomfort and the pain will eventually resolve.  He noticed this discomfort this am while hunting.   He has also noticed this while push mowing.   These have been going on for about 1 year.    He had a myoview in 2012 which was negative.    12/04/2015: Hunter Mcbride is doing well. He he has seen Hunter Mcbride for the past 2-1/2 years. Is seen with wife, Hunter Mcbride,   Had a cath in June, 2017.   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   Went to the ER in Sept with angina . Thought it may be indigestion.   Was taking NTG .     Imdur was increased . Seems to be improved at this point  Has some orthostatic hypotension because of the increased Imdur dosing .  Is on Ranexa 500 BID   July 25, 2019: Hunter Mcbride is seen back today for evaluation of chest pain.  He has a history of coronary artery disease. Presents with 2 weeks of episodic CP Usually with exertion . Has started radiating to left arm.  Dull ache.  Has significant intrascapular pain .   Dull ache with hot sensation  Took 4 SL NTG on Friday ,  Would ease up for several minutes.  Woke up  frequently Friday night with recurrent cp Had chest soreness since then  Has continued to have these CP  Associated with nausea,   Is very fatigued.  He sat around yesterday ,  Pain eased off.  Last night took more SL nitro, tylenol   Current Outpatient Medications on File Prior to Visit  Medication Sig Dispense Refill  . acetaminophen (TYLENOL) 500 MG tablet Take 1,000 mg by mouth every 8 (eight) hours as needed (pain).     [name] ALPRAZolam (XANAX) 0.5 MG tablet Take 0.25 mg by mouth as needed for anxiety or sleep.    [name] amLODipine (NORVASC) 5 MG tablet TAKE ONE TABLET BY MOUTH TWICE DAILY  180 tablet 3  . aspirin EC 81 MG tablet Take 81 mg by mouth daily.    [name] atenolol (TENORMIN) 50 MG tablet Take 1 tablet (50 mg total) by mouth at bedtime. 90 tablet 3  . atorvastatin (LIPITOR) 80 MG tablet Take 80 mg by mouth daily at 6 PM.    . canagliflozin (INVOKANA) 300 MG TABS tablet Take 300 mg by mouth daily before breakfast.    . Cholecalciferol (VITAMIN D) 50 MCG (2000 UT) tablet Take 2,000 Units by mouth daily.    [name]  clopidogrel (PLAVIX) 75 MG tablet TAKE ONE TABLET BY MOUTH ONE TIME DAILY breakfast 90 tablet 3  . docusate sodium (COLACE) 100 MG capsule Take 100 mg by mouth daily as needed for mild constipation.    [name] FREESTYLE TEST STRIPS test strip 1 each by Other route every morning.     . gabapentin (NEURONTIN) 300 MG capsule Take 300 mg by mouth daily.     . hydrochlorothiazide (HYDRODIURIL) 25 MG tablet Take 25 mg by mouth daily.    . insulin degludec (TRESIBA FLEXTOUCH) 100 UNIT/ML SOPN FlexTouch Pen Inject 18 Units into the skin daily.     . Insulin Glargine, 1 Unit Dial, (TOUJEO SOLOSTAR) 300 UNIT/ML SOPN Inject 18 Units into the skin daily.    . irbesartan (AVAPRO) 300 MG tablet Take 300 mg by mouth daily.    . metFORMIN (GLUCOPHAGE-XR) 500 MG 24 hr tablet Take 500 mg by mouth 2 (two) times daily.    . methocarbamol (ROBAXIN) 500 MG tablet Take 500 mg by mouth every 8 (eight) hours as  needed for muscle spasms (back pain).     . Multiple Vitamin (MULTIVITAMIN) tablet Take 1 tablet by mouth daily.    . nitroGLYCERIN (NITROSTAT) 0.4 MG SL tablet DISSOLVE 1 TABLET UNDER TONGUE AS NEEDEDFOR CHEST PAIN. MAY REPEAT 5 MINUTES APART 3 TIMES IF NEEDED 25 tablet 3  . NONFORMULARY OR COMPOUNDED ITEM Antifungal solution: Terbinafine 3%, Fluconazole 2%, Tea Tree Oil 5%, Urea 10%, Ibuprofen 2% in DMSO suspension #64mL 1 each 3  . pantoprazole (PROTONIX) 40 MG tablet Take 1 tablet (40 mg total) by mouth 2 (two) times daily before a meal. 60 tablet 1  . polyethylene glycol (MIRALAX / GLYCOLAX) packet Take 17 g by mouth daily as needed for mild constipation.    . promethazine (PHENERGAN) 25 MG tablet Take 12.5 mg by mouth every 6 (six) hours as needed.    . ranolazine (RANEXA) 500 MG 12 hr tablet Take 1 tablet (500 mg total) by mouth 2 (two) times daily. 180 tablet 3   No current facility-administered medications on file prior to visit.    Allergies  Allergen Reactions  . Oxycodone Nausea Only    Past Medical History:  Diagnosis Date  . Arthritis    "lower back; right shoulder; left thumb; joints" (06/21/2013)  . Asthma    "not sure if this is true or not" (06/21/2013)  . CAD (coronary artery disease) 06/2013   s/p PTCA/DES x 3 to mid LAD 2015. b. s/p CABG in 2018. c. cath 12/2017 -> med rx.  [name] GERD (gastroesophageal reflux disease)   . Gout    "maybe twice in my life"  . Headache(784.0)    "weekly for the last 3-4 months" (06/21/2013)  . Hyperlipidemia   . Hypertension   . Myocardial infarction [facility])    "/Dr. Katharina Caper I've had one between 2014-2015) (06/21/2013)  . Osteoarthritis   . Sleep apnea    WEIGHT LOSS, NO LONGER NEEDS PER PATIENT  . Type II diabetes mellitus ([place])    TYPE 2    Past Surgical History:  Procedure Laterality Date  . CARDIAC CATHETERIZATION  1980's   "once"  . CARDIAC CATHETERIZATION N/A 07/23/2015   Procedure: Left Heart Cath and Coronary Angiography;   Surgeon: Sherren Mocha, MD;  Location: [facility];  Service: Cardiovascular;  Laterality: N/A;  . CARDIOVASCULAR STRESS TEST  06/10/2008   EF 68%  . CARPAL TUNNEL RELEASE Bilateral   . CORONARY ANGIOPLASTY WITH STENT  PLACEMENT  06/21/2013   "3"  . CORONARY ARTERY BYPASS GRAFT N/A 11/07/2016   Procedure: CORONARY ARTERY BYPASS GRAFTING (CABG) x 5 (LIMA to DISTAL LAD, SVG to DIAGONAL, SVG to CIRCUMFLEX, and SVG SEQUENTIALLY to PLB and DISTAL PDA) with EVH of the RIGHT GREATER SAPHENOUS VEIN and LEFT INTERNAL MAMMARY ARTERY HARVEST;  Surgeon: Grace Isaac, MD;  Location: [facility];  Service: Open Heart Surgery;  Laterality: N/A;  . CORONARY STENT INTERVENTION N/A 06/07/2016   Procedure: Coronary Stent Intervention;  Surgeon: Burnell Blanks, MD;  Location: [facility];  Service: Cardiovascular;  Laterality: N/A;  . EXCISION MORTON'S NEUROMA Left   . EYE SURGERY Left    "removed film over my eye"  . FRACTIONAL FLOW RESERVE WIRE N/A 06/26/2013   Procedure: FRACTIONAL FLOW RESERVE WIRE;  Surgeon: [name], MD;  Location: [facility];  Service: Cardiovascular;  Laterality: N/A;  . HERNIA REPAIR Left   . INTRAVASCULAR PRESSURE WIRE/FFR STUDY N/A 10/28/2016   Procedure: INTRAVASCULAR PRESSURE WIRE/FFR STUDY;  Surgeon: Leonie Man, MD;  Location: [facility];  Service: Cardiovascular;  Laterality: N/A;  . JOINT REPLACEMENT Left 03/2013   "thumb"  . LEFT HEART CATH AND CORONARY ANGIOGRAPHY N/A 06/07/2016   Procedure: Left Heart Cath and Coronary Angiography;  Surgeon: Burnell Blanks, MD;  Location: [facility];  Service: Cardiovascular;  Laterality: N/A;  . LEFT HEART CATH AND CORONARY ANGIOGRAPHY N/A 10/28/2016   Procedure: LEFT HEART CATH AND CORONARY ANGIOGRAPHY;  Surgeon: Leonie Man, MD;  Location: [facility];  Service: Cardiovascular;  Laterality: N/A;  . LEFT HEART CATH AND CORS/GRAFTS ANGIOGRAPHY N/A 12/25/2017   Procedure: LEFT  HEART CATH AND CORS/GRAFTS ANGIOGRAPHY;  Surgeon: Sherren Mocha, MD;  Location: [facility];  Service: Cardiovascular;  Laterality: N/A;  . LEFT HEART CATHETERIZATION WITH CORONARY ANGIOGRAM N/A 06/21/2013   Procedure: LEFT HEART CATHETERIZATION WITH CORONARY ANGIOGRAM;  Surgeon: Burnell Blanks, MD;  Location: [facility];  Service: Cardiovascular;  Laterality: N/A;  . LEFT HEART CATHETERIZATION WITH CORONARY ANGIOGRAM N/A 06/26/2013   Procedure: LEFT HEART CATHETERIZATION WITH CORONARY ANGIOGRAM;  Surgeon: [name], MD;  Location: [facility];  Service: Cardiovascular;  Laterality: N/A;  . LUMBAR LAMINECTOMY/DECOMPRESSION MICRODISCECTOMY Left 05/30/2018   Procedure: Left  Gill L5 decompression;  Surgeon: Melina Schools, MD;  Location: [facility];  Service: Orthopedics;  Laterality: Left;  138min  . TEE WITHOUT CARDIOVERSION N/A 11/07/2016   Procedure: TRANSESOPHAGEAL ECHOCARDIOGRAM (TEE);  Surgeon: Grace Isaac, MD;  Location: [facility];  Service: Open Heart Surgery;  Laterality: N/A;  . TONSILLECTOMY    . TOTAL HIP ARTHROPLASTY Right 04/12/2016   Procedure: RIGHT TOTAL HIP ARTHROPLASTY ANTERIOR APPROACH;  Surgeon: Paralee Cancel, MD;  Location: [facility];  Service: Orthopedics;  Laterality: Right;  requests 70 mins  . TOTAL SHOULDER ARTHROPLASTY  03/01/2012   Procedure: TOTAL SHOULDER ARTHROPLASTY;  Surgeon: Marin Shutter, MD;  Location: [facility];  Service: Orthopedics;  Laterality: Right;  . UMBILICAL HERNIA REPAIR      Social History   Tobacco Use  Smoking Status Never Smoker  Smokeless Tobacco Never Used    Social History   Substance and Sexual Activity  Alcohol Use No    Family History  Problem Relation Age of Onset  . Diabetes type II Mother   . Heart attack Father   . Coronary artery disease Cousin   . Heart disease Brother  Reviw of Systems:  Reviewed in the HPI.  All other systems are negative.    ECG:   July 25, 2019: Normal sinus rhythm at 65.   First-degree AV block.  Occasional premature ventricular contraction.  He has inferior Q waves.  He has very minimal ST segment elevation in the inferior leads.  This was present to some degree on his previous EKGs.   Assessment / Plan:   1. Coronary artery disease: Aijalon presents with symptoms that are consistent with unstable angina.  He has chest pressure with radiation to the arm.  It also radiates intrascapular leg and feels like a burning poker-like sensation.  These symptoms occur with minimal exertion and recently has been having them at rest.  I think at this point we have no choice but to admit him to the hospital and start him on IV nitroglycerin and IV heparin.  We will schedule him for heart catheterization tomorrow. We will check serial troponin levels. I discussed heart catheterization including risks, benefits, options.  He understands and agrees to proceed.    2. Hyperlipidemia: Continue atorvastatin for now.  Will check lipid levels tomorrow morning.  3.  Diabetes Mellitus:  Will hold metformin.  Will hold insulin tomorrow am     Mertie Moores, MD  07/25/2019 10:38 AM    [facility] [place],  [place], [place]  [address] Phone: [phone]; Fax: [phone]

## 2019-07-25 NOTE — Plan of Care (Signed)
  Problem: Education: Goal: Understanding of CV disease, CV risk reduction, and recovery process will improve Outcome: Progressing Goal: Individualized Educational Video(s) Outcome: Progressing   Problem: Activity: Goal: Ability to return to baseline activity level will improve Outcome: Progressing   Problem: Cardiovascular: Goal: Ability to achieve and maintain adequate cardiovascular perfusion will improve Outcome: Progressing Goal: Vascular access site(s) Level 0-1 will be maintained Outcome: Progressing   Problem: Health Behavior/Discharge Planning: Goal: Ability to safely manage health-related needs after discharge will improve Outcome: Progressing   Problem: Education: Goal: Understanding of cardiac disease, CV risk reduction, and recovery process will improve Outcome: Progressing Goal: Individualized Educational Video(s) Outcome: Progressing   Problem: Activity: Goal: Ability to tolerate increased activity will improve Outcome: Progressing   Problem: Cardiac: Goal: Ability to achieve and maintain adequate cardiovascular perfusion will improve Outcome: Progressing   Problem: Health Behavior/Discharge Planning: Goal: Ability to safely manage health-related needs after discharge will improve Outcome: Progressing

## 2019-07-25 NOTE — Progress Notes (Addendum)
ANTICOAGULATION CONSULT NOTE - Initial Consult  Pharmacy Consult for Heparin Indication: chest pain/ACS  Allergies  Allergen Reactions  . Oxycodone Nausea Only    Patient Measurements: Height: 5\' 9"  (175.3 cm) Weight: 86.9 kg (191 lb 9.3 oz) IBW/kg (Calculated) : 70.7 Heparin Dosing Weight: 86.9 kg  Vital Signs: Temp: 97.7 F (36.5 C) (06/10 1603) Temp Source: Oral (06/10 1603) BP: 119/72 (06/10 1603) Pulse Rate: 54 (06/10 1603)  Labs: Recent Labs    07/25/19 1704  HGB 14.2  HCT 41.8  PLT 121*  LABPROT 12.9  INR 1.0    CrCl cannot be calculated (Patient's most recent lab result is older than the maximum 21 days allowed.).   Medical History: Past Medical History:  Diagnosis Date  . Arthritis    "lower back; right shoulder; left thumb; joints" (06/21/2013)  . Asthma    "not sure if this is true or not" (06/21/2013)  . CAD (coronary artery disease) 06/2013   s/p PTCA/DES x 3 to mid LAD 2015. b. s/p CABG in 2018. c. cath 12/2017 -> med rx.  [name] GERD (gastroesophageal reflux disease)   . Gout    "maybe twice in my life"  . Headache(784.0)    "weekly for the last 3-4 months" (06/21/2013)  . Hyperlipidemia   . Hypertension   . Myocardial infarction [facility])    "/Dr. Katharina Caper I've had one between 2014-2015) (06/21/2013)  . Osteoarthritis   . Sleep apnea    WEIGHT LOSS, NO LONGER NEEDS PER PATIENT  . Type II diabetes mellitus ([facility])    TYPE 2   Assessment: 74 yr old male direct admit from provider office for back/chest pain with radiation to L arm. Pharmacy is consulted to dose heparin. Pt was not on anticoagulant PTA.  H/H, platelets WNL; INR 1.0; Scr pending  Goal of Therapy:  Heparin level 0.3-0.7 units/ml Monitor platelets by anticoagulation protocol: Yes   Plan:  Heparin 4000 units IV bolus X 1, followed by heparin infusion at 1050 units/hr Check 8-hr heparin level Monitor daily heparin level, CBC Monitor for signs/symptoms of bleeding F/U Cardiology  plans  Gillermina Hu, PharmD, BCPS, [facility] Clinical Pharmacist 07/25/2019,4:35 PM

## 2019-07-25 NOTE — Patient Instructions (Addendum)
Medication Instructions:  Your physician recommends that you continue on your current medications as directed. Please refer to the Current Medication list given to you today.  *If you need a refill on your cardiac medications before your next appointment, please call your pharmacy*   Lab Work: None Ordered If you have labs (blood work) drawn today and your tests are completely normal, you will receive your results only by: [name] MyChart Message (if you have MyChart) OR . A paper copy in the mail If you have any lab test that is abnormal or we need to change your treatment, we will call you to review the results.   Testing/Procedures: Your physician has requested that you have a cardiac catheterization. Cardiac catheterization is used to diagnose and/or treat various heart conditions. Doctors may recommend this procedure for a number of different reasons. The most common reason is to evaluate chest pain. Chest pain can be a symptom of coronary artery disease (CAD), and cardiac catheterization can show whether plaque is narrowing or blocking your heart's arteries. This procedure is also used to evaluate the valves, as well as measure the blood flow and oxygen levels in different parts of your heart. For further information please visit [email or web address]. Please follow instruction sheet, as given.  You will be admitted to [facility]. Please await a call from bed placement. If your symptoms worsen, please go to the Emergency Department at [facility]   Follow-Up: At [facility], you and your health needs are our priority.  As part of our continuing mission to provide you with exceptional heart care, we have created designated Provider Care Teams.  These Care Teams include your primary Cardiologist (physician) and Advanced Practice Providers (APPs -  Physician Assistants and Nurse Practitioners) who all work together to provide you with the care you need, when you need it.   Your next  appointment:   2 month(s) on Thursday August 12 at 8:00 am  The format for your next appointment:   In Person  Provider:   Mertie Moores, MD

## 2019-07-26 ENCOUNTER — Encounter (HOSPITAL_COMMUNITY)
Admission: EM | Disposition: A | Discharge status: Home / Self Care | Payer: Self-pay | Source: Ambulatory Visit | Attending: Cardiovascular Disease

## 2019-07-26 ENCOUNTER — Encounter (HOSPITAL_COMMUNITY): Payer: Self-pay | Admitting: Cardiovascular Disease

## 2019-07-26 ENCOUNTER — Ambulatory Visit (HOSPITAL_COMMUNITY): Admission: RE | Admit: 2019-07-26 | Payer: PPO | Source: Home / Self Care | Admitting: Cardiovascular Disease

## 2019-07-26 DIAGNOSIS — I200 Unstable angina: Secondary | ICD-10-CM

## 2019-07-26 DIAGNOSIS — I25710 Atherosclerosis of autologous vein coronary artery bypass graft(s) with unstable angina pectoris: Secondary | ICD-10-CM

## 2019-07-26 DIAGNOSIS — Z9889 Other specified postprocedural states: Secondary | ICD-10-CM

## 2019-07-26 DIAGNOSIS — I25110 Atherosclerotic heart disease of native coronary artery with unstable angina pectoris: Principal | ICD-10-CM

## 2019-07-26 HISTORY — PX: LEFT HEART CATH AND CORS/GRAFTS ANGIOGRAPHY: CATH118250

## 2019-07-26 LAB — GLUCOSE, CAPILLARY
Glucose-Capillary: 114 mg/dL — ABNORMAL HIGH (ref 70–99)
Glucose-Capillary: 85 mg/dL (ref 70–99)
Glucose-Capillary: 210 mg/dL — ABNORMAL HIGH (ref 70–99)

## 2019-07-26 LAB — CBC
HCT: 42.4 % (ref 39.0–52.0)
Hemoglobin: 14.2 g/dL (ref 13.0–17.0)
MCH: 30.1 pg (ref 26.0–34.0)
MCHC: 33.5 g/dL (ref 30.0–36.0)
MCV: 90 fL (ref 80.0–100.0)
Platelets: 127 10*3/uL — ABNORMAL LOW (ref 150–400)
RBC: 4.71 MIL/uL (ref 4.22–5.81)
RDW: 12.7 % (ref 11.5–15.5)
WBC: 4 10*3/uL (ref 4.0–10.5)
nRBC: 0 % (ref 0.0–0.2)

## 2019-07-26 LAB — BASIC METABOLIC PANEL
Anion gap: 6 (ref 5–15)
BUN: 23 mg/dL (ref 8–23)
CO2: 29 mmol/L (ref 22–32)
Calcium: 9.7 mg/dL (ref 8.9–10.3)
Chloride: 104 mmol/L (ref 98–111)
Creatinine, Ser: 0.98 mg/dL (ref 0.61–1.24)
GFR calc Af Amer: >60 mL/min (ref >60)
GFR calc non Af Amer: >60 mL/min (ref >60)
Glucose, Bld: 148 mg/dL — ABNORMAL HIGH (ref 70–99)
Potassium: 4.1 mmol/L (ref 3.5–5.1)
Sodium: 139 mmol/L (ref 135–145)

## 2019-07-26 LAB — LIPID PANEL
Cholesterol: 96 mg/dL (ref 0–200)
HDL: 35 mg/dL — ABNORMAL LOW (ref >40)
LDL Cholesterol: 47 mg/dL (ref 0–99)
Total CHOL/HDL Ratio: 2.7 RATIO
Triglycerides: 70 mg/dL (ref <150)
VLDL: 14 mg/dL (ref 0–40)

## 2019-07-26 LAB — HEMOGLOBIN A1C
Hgb A1c MFr Bld: 7.3 % — ABNORMAL HIGH (ref 4.8–5.6)
Mean Plasma Glucose: 162.81 mg/dL

## 2019-07-26 LAB — HEPARIN LEVEL (UNFRACTIONATED)
Heparin Unfractionated: 0.28 IU/mL — ABNORMAL LOW (ref 0.30–0.70)
Heparin Unfractionated: 0.57 IU/mL (ref 0.30–0.70)

## 2019-07-26 SURGERY — LEFT HEART CATH AND CORS/GRAFTS ANGIOGRAPHY
Anesthesia: LOCAL

## 2019-07-26 MED ORDER — SODIUM CHLORIDE 0.9% FLUSH: 3.0000 mL | Freq: Two times a day (BID) | INTRAVENOUS | Status: DC

## 2019-07-26 MED ORDER — METFORMIN HCL ER 500 MG PO TB24: 500.0000 mg | ORAL_TABLET | Freq: Two times a day (BID) | ORAL | Status: DC

## 2019-07-26 MED ORDER — CLOPIDOGREL BISULFATE 75 MG PO TABS: 75.0000 mg | ORAL_TABLET | Freq: Every day | ORAL | Status: DC

## 2019-07-26 MED ORDER — SODIUM CHLORIDE 0.9 % IV SOLN: 250.0000 mL | INTRAVENOUS | Status: DC | PRN

## 2019-07-26 MED ORDER — VERAPAMIL HCL 2.5 MG/ML IV SOLN
INTRAVENOUS | Status: DC | PRN
  Administered 2019-07-26: 10 mL via INTRA_ARTERIAL

## 2019-07-26 MED ORDER — HEPARIN (PORCINE) IN NACL 1000-0.9 UT/500ML-% IV SOLN
INTRAVENOUS | Status: DC | PRN
  Administered 2019-07-26 (×2): 500 mL

## 2019-07-26 MED ORDER — HEPARIN (PORCINE) IN NACL 1000-0.9 UT/500ML-% IV SOLN
INTRAVENOUS | Status: AC
  Filled 2019-07-26: qty 1000

## 2019-07-26 MED ORDER — LIDOCAINE HCL (PF) 1 % IJ SOLN
INTRAMUSCULAR | Status: AC
  Filled 2019-07-26: qty 30

## 2019-07-26 MED ORDER — ISOSORBIDE MONONITRATE ER 30 MG PO TB24
30.0000 mg | ORAL_TABLET | Freq: Every day | ORAL | Status: DC
  Filled 2019-07-26: qty 1

## 2019-07-26 MED ORDER — VERAPAMIL HCL 2.5 MG/ML IV SOLN
INTRAVENOUS | Status: AC
  Filled 2019-07-26: qty 2

## 2019-07-26 MED ORDER — NONFORMULARY OR COMPOUNDED ITEM: 1.0000 "application " | Status: DC

## 2019-07-26 MED ORDER — HEPARIN SODIUM (PORCINE) 1000 UNIT/ML IJ SOLN
INTRAMUSCULAR | Status: AC
  Filled 2019-07-26: qty 1

## 2019-07-26 MED ORDER — MIDAZOLAM HCL 2 MG/2ML IJ SOLN
INTRAMUSCULAR | Status: DC | PRN
  Administered 2019-07-26: 1 mg via INTRAVENOUS

## 2019-07-26 MED ORDER — SODIUM CHLORIDE 0.9% FLUSH: 3.0000 mL | INTRAVENOUS | Status: DC | PRN

## 2019-07-26 MED ORDER — ISOSORBIDE MONONITRATE ER 30 MG PO TB24: 30.0000 mg | ORAL_TABLET | Freq: Every day | ORAL | 6 refills | Status: DC

## 2019-07-26 MED ORDER — FENTANYL CITRATE (PF) 100 MCG/2ML IJ SOLN
INTRAMUSCULAR | Status: DC | PRN
  Administered 2019-07-26: 25 ug via INTRAVENOUS

## 2019-07-26 MED ORDER — IOHEXOL 350 MG/ML SOLN
INTRAVENOUS | Status: DC | PRN
  Administered 2019-07-26: 135 mL

## 2019-07-26 MED ORDER — AMLODIPINE BESYLATE 5 MG PO TABS: 5.0000 mg | ORAL_TABLET | Freq: Two times a day (BID) | ORAL | Status: DC

## 2019-07-26 MED ORDER — MIDAZOLAM HCL 2 MG/2ML IJ SOLN
INTRAMUSCULAR | Status: AC
  Filled 2019-07-26: qty 2

## 2019-07-26 MED ORDER — FENTANYL CITRATE (PF) 100 MCG/2ML IJ SOLN
INTRAMUSCULAR | Status: AC
  Filled 2019-07-26: qty 2

## 2019-07-26 MED ORDER — LIDOCAINE HCL (PF) 1 % IJ SOLN
INTRAMUSCULAR | Status: DC | PRN
  Administered 2019-07-26: 2 mL

## 2019-07-26 MED ORDER — HEPARIN SODIUM (PORCINE) 1000 UNIT/ML IJ SOLN
INTRAMUSCULAR | Status: DC | PRN
  Administered 2019-07-26: 5000 [IU] via INTRAVENOUS

## 2019-07-26 MED ORDER — SODIUM CHLORIDE 0.9 % IV SOLN: INTRAVENOUS | Status: AC

## 2019-07-26 SURGICAL SUPPLY — 13 items
CATH INFINITI 5 FR AR1 MOD (CATHETERS) ×1 IMPLANT
CATH INFINITI 5 FR IM (CATHETERS) ×1 IMPLANT
CATH INFINITI 5FR JL4 (CATHETERS) ×1 IMPLANT
CATH INFINITI 5FR MPB2 (CATHETERS) ×1 IMPLANT
CATH OPTITORQUE TIG 4.0 5F (CATHETERS) ×1 IMPLANT
DEVICE RAD COMP TR BAND LRG (VASCULAR PRODUCTS) ×1 IMPLANT
GLIDESHEATH SLEND SS 6F .021 (SHEATH) ×1 IMPLANT
GUIDEWIRE INQWIRE 1.5J.035X260 (WIRE) IMPLANT
INQWIRE 1.5J .035X260CM (WIRE) ×2
KIT HEART LEFT (KITS) ×2 IMPLANT
PACK CARDIAC CATHETERIZATION (CUSTOM PROCEDURE TRAY) ×2 IMPLANT
TRANSDUCER W/STOPCOCK (MISCELLANEOUS) ×2 IMPLANT
TUBING CIL FLEX 10 FLL-RA (TUBING) ×2 IMPLANT

## 2019-07-26 NOTE — H&P (Signed)
Hunter Mcbride. Date of Birth              01/04/1946 [place]  [address]    [place]                          [place], [place]  [address]                                            [place], [place]  [address] [phone]              Fax  [phone]                 [phone]  Fax [phone]  Problem list: 1. History of chest pain-negative stress Myoview study in April, 2010 2. Hyperlipidemia 3. Hypertension 4. Diabetes mellitus 5. Sleep apnea   Hunter Mcbride is doing well.  His quite active. 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 He's not had any episodes of chest pain, shortness breath, syncope, or presyncope.  06/05/2013:    Hunter Mcbride is doing well.  He has been [place] hunting this am.   No CP or dyspnea.  He has noticed that he has some mild vague chest and arm ache - especially with walking.  He can usually walk through the discomfort and the pain will eventually resolve.  He noticed this discomfort this am while hunting.   He has also noticed this while push mowing.   These have been going on for about 1 year.    He had a myoview in 2012 which was negative.    12/04/2015: Hunter Mcbride is doing well. He he has seen [name] for the past 2-1/2 years. Is seen with wife, Hunter Mcbride,   Had a cath in June, 2017.   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   Went to the ER in Sept with angina . Thought it may be indigestion.   Was taking NTG .     Imdur was increased . Seems to be improved at this point  Has some orthostatic hypotension because of the increased Imdur dosing .  Is on Ranexa 500 BID   July 25, 2019: Hunter Mcbride is seen back today for evaluation of chest pain.  He has a history of coronary artery disease. Presents with 2 weeks of episodic CP Usually with exertion . Has started radiating to left arm.  Dull  ache.  Has significant intrascapular pain .   Dull ache with hot sensation  Took 4 SL NTG on Friday ,  Would ease up for several minutes.  Woke up frequently Friday night with recurrent cp Had chest soreness since then  Has continued to have these CP  Associated with nausea,   Is very fatigued.  He sat around yesterday ,  Pain eased off.  Last night took more SL nitro, tylenol         Current Outpatient Medications on File Prior to Visit  Medication Sig  Dispense Refill  . acetaminophen (TYLENOL) 500 MG tablet Take 1,000 mg by mouth every 8 (eight) hours as needed (pain).     [name] ALPRAZolam (XANAX) 0.5 MG tablet Take 0.25 mg by mouth as needed for anxiety or sleep.    [name] amLODipine (NORVASC) 5 MG tablet TAKE ONE TABLET BY MOUTH TWICE DAILY  180 tablet 3  . aspirin EC 81 MG tablet Take 81 mg by mouth daily.    [name] atenolol (TENORMIN) 50 MG tablet Take 1 tablet (50 mg total) by mouth at bedtime. 90 tablet 3  . atorvastatin (LIPITOR) 80 MG tablet Take 80 mg by mouth daily at 6 PM.    . canagliflozin (INVOKANA) 300 MG TABS tablet Take 300 mg by mouth daily before breakfast.    . Cholecalciferol (VITAMIN D) 50 MCG (2000 UT) tablet Take 2,000 Units by mouth daily.    . clopidogrel (PLAVIX) 75 MG tablet TAKE ONE TABLET BY MOUTH ONE TIME DAILY breakfast 90 tablet 3  . docusate sodium (COLACE) 100 MG capsule Take 100 mg by mouth daily as needed for mild constipation.    [name] FREESTYLE TEST STRIPS test strip 1 each by Other route every morning.     . gabapentin (NEURONTIN) 300 MG capsule Take 300 mg by mouth daily.     . hydrochlorothiazide (HYDRODIURIL) 25 MG tablet Take 25 mg by mouth daily.    . insulin degludec (TRESIBA FLEXTOUCH) 100 UNIT/ML SOPN FlexTouch Pen Inject 18 Units into the skin daily.     . Insulin Glargine, 1 Unit Dial, (TOUJEO SOLOSTAR) 300 UNIT/ML SOPN Inject 18 Units into the skin daily.    . irbesartan (AVAPRO) 300 MG tablet Take 300 mg by mouth daily.     . metFORMIN (GLUCOPHAGE-XR) 500 MG 24 hr tablet Take 500 mg by mouth 2 (two) times daily.    . methocarbamol (ROBAXIN) 500 MG tablet Take 500 mg by mouth every 8 (eight) hours as needed for muscle spasms (back pain).     . Multiple Vitamin (MULTIVITAMIN) tablet Take 1 tablet by mouth daily.    . nitroGLYCERIN (NITROSTAT) 0.4 MG SL tablet DISSOLVE 1 TABLET UNDER TONGUE AS NEEDEDFOR CHEST PAIN. MAY REPEAT 5 MINUTES APART 3 TIMES IF NEEDED 25 tablet 3  . NONFORMULARY OR COMPOUNDED ITEM Antifungal solution: Terbinafine 3%, Fluconazole 2%, Tea Tree Oil 5%, Urea 10%, Ibuprofen 2% in DMSO suspension #1mL 1 each 3  . pantoprazole (PROTONIX) 40 MG tablet Take 1 tablet (40 mg total) by mouth 2 (two) times daily before a meal. 60 tablet 1  . polyethylene glycol (MIRALAX / GLYCOLAX) packet Take 17 g by mouth daily as needed for mild constipation.    . promethazine (PHENERGAN) 25 MG tablet Take 12.5 mg by mouth every 6 (six) hours as needed.    . ranolazine (RANEXA) 500 MG 12 hr tablet Take 1 tablet (500 mg total) by mouth 2 (two) times daily. 180 tablet 3   No current facility-administered medications on file prior to visit.        Allergies  Allergen Reactions  . Oxycodone Nausea Only        Past Medical History:  Diagnosis Date  . Arthritis    "lower back; right shoulder; left thumb; joints" (06/21/2013)  . Asthma    "not sure if this is true or not" (06/21/2013)  . CAD (coronary artery disease) 06/2013   s/p PTCA/DES x 3 to mid LAD 2015. b. s/p CABG in 2018. c. cath 12/2017 -> med rx.  [name]  GERD (gastroesophageal reflux disease)   . Gout    "maybe twice in my life"  . Headache(784.0)    "weekly for the last 3-4 months" (06/21/2013)  . Hyperlipidemia   . Hypertension   . Myocardial infarction [facility])    "/Dr. Katharina Caper I've had one between 2014-2015) (06/21/2013)  . Osteoarthritis   . Sleep apnea    WEIGHT LOSS, NO LONGER NEEDS PER PATIENT  . Type II diabetes mellitus  ([place])    TYPE 2    Past Surgical History:  Procedure Laterality Date  . CARDIAC CATHETERIZATION  1980's   "once"  . CARDIAC CATHETERIZATION N/A 07/23/2015   Procedure: Left Heart Cath and Coronary Angiography;  Surgeon: Sherren Mocha, MD;  Location: [place] [facility];  Service: Cardiovascular;  Laterality: N/A;  . CARDIOVASCULAR STRESS TEST  06/10/2008   EF 68%  . CARPAL TUNNEL RELEASE Bilateral   . CORONARY ANGIOPLASTY WITH STENT PLACEMENT  06/21/2013   "3"  . CORONARY ARTERY BYPASS GRAFT N/A 11/07/2016   Procedure: CORONARY ARTERY BYPASS GRAFTING (CABG) x 5 (LIMA to DISTAL LAD, SVG to DIAGONAL, SVG to CIRCUMFLEX, and SVG SEQUENTIALLY to PLB and DISTAL PDA) with EVH of the RIGHT GREATER SAPHENOUS VEIN and LEFT INTERNAL MAMMARY ARTERY HARVEST;  Surgeon: Grace Isaac, MD;  Location: [facility];  Service: Open Heart Surgery;  Laterality: N/A;  . CORONARY STENT INTERVENTION N/A 06/07/2016   Procedure: Coronary Stent Intervention;  Surgeon: Burnell Blanks, MD;  Location: [place] [facility];  Service: Cardiovascular;  Laterality: N/A;  . EXCISION MORTON'S NEUROMA Left   . EYE SURGERY Left    "removed film over my eye"  . FRACTIONAL FLOW RESERVE WIRE N/A 06/26/2013   Procedure: FRACTIONAL FLOW RESERVE WIRE;  Surgeon: [name], MD;  Location: [facility] CATH [facility];  Service: Cardiovascular;  Laterality: N/A;  . HERNIA REPAIR Left   . INTRAVASCULAR PRESSURE WIRE/FFR STUDY N/A 10/28/2016   Procedure: INTRAVASCULAR PRESSURE WIRE/FFR STUDY;  Surgeon: Leonie Man, MD;  Location: [facility];  Service: Cardiovascular;  Laterality: N/A;  . JOINT REPLACEMENT Left 03/2013   "thumb"  . LEFT HEART CATH AND CORONARY ANGIOGRAPHY N/A 06/07/2016   Procedure: Left Heart Cath and Coronary Angiography;  Surgeon: Burnell Blanks, MD;  Location: [facility];  Service: Cardiovascular;  Laterality: N/A;  . LEFT HEART CATH AND CORONARY ANGIOGRAPHY N/A 10/28/2016    Procedure: LEFT HEART CATH AND CORONARY ANGIOGRAPHY;  Surgeon: Leonie Man, MD;  Location: [facility];  Service: Cardiovascular;  Laterality: N/A;  . LEFT HEART CATH AND CORS/GRAFTS ANGIOGRAPHY N/A 12/25/2017   Procedure: LEFT HEART CATH AND CORS/GRAFTS ANGIOGRAPHY;  Surgeon: Sherren Mocha, MD;  Location: [place] [facility];  Service: Cardiovascular;  Laterality: N/A;  . LEFT HEART CATHETERIZATION WITH CORONARY ANGIOGRAM N/A 06/21/2013   Procedure: LEFT HEART CATHETERIZATION WITH CORONARY ANGIOGRAM;  Surgeon: Burnell Blanks, MD;  Location: [facility];  Service: Cardiovascular;  Laterality: N/A;  . LEFT HEART CATHETERIZATION WITH CORONARY ANGIOGRAM N/A 06/26/2013   Procedure: LEFT HEART CATHETERIZATION WITH CORONARY ANGIOGRAM;  Surgeon: [name], MD;  Location: [place] [facility] C[facility];  Service: Cardiovascular;  Laterality: N/A;  . LUMBAR LAMINECTOMY/DECOMPRESSION MICRODISCECTOMY Left 05/30/2018   Procedure: Left  Gill L5 decompression;  Surgeon: Melina Schools, MD;  Location: [facility];  Service: Orthopedics;  Laterality: Left;  180min  . TEE WITHOUT CARDIOVERSION N/A 11/07/2016   Procedure: TRANSESOPHAGEAL ECHOCARDIOGRAM (TEE);  Surgeon: Grace Isaac, MD;  Location:  [facility];  Service: Open Heart Surgery;  Laterality: N/A;  . TONSILLECTOMY    . TOTAL HIP ARTHROPLASTY Right 04/12/2016   Procedure: RIGHT TOTAL HIP ARTHROPLASTY ANTERIOR APPROACH;  Surgeon: Paralee Cancel, MD;  Location: [facility];  Service: Orthopedics;  Laterality: Right;  requests 70 mins  . TOTAL SHOULDER ARTHROPLASTY  03/01/2012   Procedure: TOTAL SHOULDER ARTHROPLASTY;  Surgeon: Marin Shutter, MD;  Location: [facility];  Service: Orthopedics;  Laterality: Right;  . UMBILICAL HERNIA REPAIR      Social History      Tobacco Use  Smoking Status Never Smoker  Smokeless Tobacco Never Used    Social History      Substance and Sexual Activity  Alcohol Use No         Family History  Problem  Relation Age of Onset  . Diabetes type II Mother   . Heart attack Father   . Coronary artery disease Cousin   . Heart disease Brother     Reviw of Systems:  Reviewed in the HPI.  All other systems are negative.    ECG:   July 25, 2019: Normal sinus rhythm at 65.  First-degree AV block.  Occasional premature ventricular contraction.  He has inferior Q waves.  He has very minimal ST segment elevation in the inferior leads.  This was present to some degree on his previous EKGs.   Assessment / Plan:   1. Coronary artery disease: Kunio presents with symptoms that are consistent with unstable angina.  He has chest pressure with radiation to the arm.  It also radiates intrascapular leg and feels like a burning poker-like sensation.  These symptoms occur with minimal exertion and recently has been having them at rest.  I think at this point we have no choice but to admit him to the hospital and start him on IV nitroglycerin and IV heparin.  We will schedule him for heart catheterization tomorrow. We will check serial troponin levels. I discussed heart catheterization including risks, benefits, options.  He understands and agrees to proceed.    2. Hyperlipidemia: Continue atorvastatin for now.  Will check lipid levels tomorrow morning.  3.  Diabetes Mellitus:  Will hold metformin.  Will hold insulin tomorrow am     Mertie Moores, MD  07/25/2019 10:38 AM    [facility] [place],  [place],   [address] Phone: [phone]; Fax: [phone]

## 2019-07-26 NOTE — Progress Notes (Signed)
Progress Note  Patient Name: Hunter Mcbride. Date of Encounter: 07/26/2019  [facility] Cardiologist: Mertie Moores, MD   Subjective   No CP at present  HA earlier   Inpatient Medications    Scheduled Meds: . amLODipine  5 mg Oral BID  . aspirin  324 mg Oral NOW   Or  . aspirin  300 mg Rectal NOW  . aspirin EC  81 mg Oral Daily  . atenolol  50 mg Oral QHS  . atorvastatin  80 mg Oral QHS  . canagliflozin  300 mg Oral QAC breakfast  . cholecalciferol  2,000 Units Oral Daily  . clopidogrel  75 mg Oral Daily  . docusate sodium  100 mg Oral QHS  . gabapentin  300 mg Oral QHS  . hydrochlorothiazide  25 mg Oral Daily  . insulin aspart  0-5 Units Subcutaneous QHS  . insulin aspart  0-9 Units Subcutaneous TID WC  . irbesartan  300 mg Oral QHS  . multivitamin with minerals  1 tablet Oral Daily  . pantoprazole  40 mg Oral BID AC  . ranolazine  500 mg Oral BID  . sodium chloride flush  3 mL Intravenous Q12H   Continuous Infusions: . sodium chloride    . sodium chloride 10 mL/hr at 07/25/19 1831  . sodium chloride 1 mL/kg/hr (07/26/19 0513)  . heparin 1,200 Units/hr (07/26/19 0223)  . nitroGLYCERIN 5 mcg/min (07/25/19 1831)   PRN Meds: sodium chloride, acetaminophen, ALPRAZolam, ALPRAZolam, nitroGLYCERIN, ondansetron (ZOFRAN) IV, polyethylene glycol, promethazine, sodium chloride flush, zolpidem   Vital Signs    Vitals:   07/25/19 1847 07/25/19 2113 07/26/19 0109 07/26/19 0500  BP: 137/65 122/71 109/70 128/78  Pulse: (!) 54 (!) 53 (!) 54 (!) 50  Resp:  16 17 16   Temp: 98.4 F (36.9 C) 98.7 F (37.1 C) 98.4 F (36.9 C) 97.7 F (36.5 C)  TempSrc: Oral Oral Oral Oral  SpO2:  98% 97% 99%  Weight:    86.1 kg  Height:        Intake/Output Summary (Last 24 hours) at 07/26/2019 0709 Last data filed at 07/26/2019 0600 Gross per 24 hour  Intake 1096.79 ml  Output 1325 ml  Net -228.21 ml   Last 3 Weights 07/26/2019 07/25/2019 07/25/2019  Weight (lbs) 189 lb 14.4 oz  191 lb 9.3 oz 191 lb  Weight (kg) 86.138 kg 86.9 kg 86.637 kg      Telemetry    SR  - Personally Reviewed  ECG    Not today  - Personally Reviewed  Physical Exam   GEN: No acute distress.   Neck: No JVD Cardiac: RRR, no murmurs, rubs, or gallops.  Respiratory: Clear to auscultation bilaterally. GI: Soft, nontender, non-distended  MS: No edema; No deformity.  L wrist with band   Neuro:  Nonfocal  Psych: Normal affect   Labs    High Sensitivity Troponin:   Recent Labs  Lab 07/25/19 1704 07/25/19 1857  TROPONINIHS 5 4      Chemistry Recent Labs  Lab 07/25/19 1704 07/26/19 0052  NA 140 139  K 3.8 4.1  CL 106 104  CO2 25 29  GLUCOSE 140* 148*  BUN 23 23  CREATININE 1.20 0.98  CALCIUM 9.5 9.7  PROT 6.3*  --   ALBUMIN 3.9  --   AST 23  --   ALT 35  --   ALKPHOS 67  --   BILITOT 0.8  --   GFRNONAA >60 >60  GFRAA >60 >60  ANIONGAP 9 6     Hematology Recent Labs  Lab 07/25/19 1704 07/26/19 0052  WBC 3.7* 4.0  RBC 4.62 4.71  HGB 14.2 14.2  HCT 41.8 42.4  MCV 90.5 90.0  MCH 30.7 30.1  MCHC 34.0 33.5  RDW 13.1 12.7  PLT 121* 127*    BNPNo results for input(s): BNP, PROBNP in the last 168 hours.   DDimer No results for input(s): DDIMER in the last 168 hours.   Radiology    No results found.  Cardiac Studies   Cath today   LIMA graft was visualized by angiography and is normal in caliber.  The graft exhibits no disease.  Prox RCA lesion is 50% stenosed.  Mid RCA lesion is 85% stenosed.  The left ventricular systolic function is normal.  LV end diastolic pressure is mildly elevated.  The left ventricular ejection fraction is 55-65% by visual estimate.  SVG graft was visualized by angiography and is normal in caliber.  The graft exhibits no disease.  2nd Diag lesion is 90% stenosed.  1st Mrg lesion is 80% stenosed.  SVG graft was visualized by angiography and is normal in caliber.  The graft exhibits no disease.  Prox LAD  to Mid LAD lesion is 30% stenosed.  Ost LM lesion is 60% stenosed.  Prox Cx to Mid Cx lesion is 80% stenosed.  SVG graft was visualized by angiography.  Prox Graft lesion is 40% stenosed.   1.  Significant underlying three-vessel coronary artery disease with patent grafts including LIMA to LAD, SVG to second diagonal, SVG to OM and SVG to right PL/PDA.  There is progression of native coronary artery disease including the RCA and also ostial left main.  However, the LIMA is no longer atretic. 2.  Normal LV systolic function mildly elevated left ventricular end-diastolic pressure.  Recommendations: No clear explanation of why the patient's anginal symptoms have worsened.  Recommend continuing aggressive medical therapy.  Treating the ostial left main with PCI might improve the flow to the distal left circumflex.  However, risks probably outweigh the benefit given that the distal distribution is small and does get some retrograde flow from SVG to OM.    Patient Profile     74 y.o. male Hx of CAD, HL and DM who was admitted for chest pressure / [place] for cardiac cath  Assessment & Plan    1  CAD  Pt with hx of CAD, s/p CABG   Seen earlier this spring  Doing OK   Then seen yesterday by P Nahser with increased pain   Admitted   Cath today   Results above    Pt on several meds for CAD/Angina   Stiil with symptoms    I have rveviewed with P Nahser  Would recomm trial again of nitrate (Imdur 30 mg)   Follow   HA if he gets should be limited     F/U with P Nahser in clinic   2  HL  Conitnue meds   LDL excellent  47    3  DM  Continue meds   For questions or updates, please contact [place] Please consult [email or web address] for contact info under        Signed, Dorris Carnes, MD  07/26/2019, 7:09 AM

## 2019-07-26 NOTE — Progress Notes (Signed)
ANTICOAGULATION CONSULT NOTE - Follow Up Consult  Pharmacy Consult for heparin Indication: USAP  Labs: Recent Labs    07/25/19 1704 07/25/19 1857 07/26/19 0052  HGB 14.2  --  14.2  HCT 41.8  --  42.4  PLT 121*  --  127*  LABPROT 12.9  --   --   INR 1.0  --   --   HEPARINUNFRC  --   --  0.28*  CREATININE 1.20  --   --   TROPONINIHS 5 4  --     Assessment: 73yo male subtherapeutic on heparin with initial dosing for CP; no gtt issues or signs of bleeding per RN.  Goal of Therapy:  Heparin level 0.3-0.7 units/ml   Plan:  Will increase heparin gtt by 1-2 units/kg/hr to 1200 units/hr and check level in 6 hours.    Wynona Neat, PharmD, BCPS  07/26/2019,2:00 AM

## 2019-07-26 NOTE — Interval H&P Note (Signed)
History and Physical Interval Note:  07/26/2019 10:29 AMCath Lab Visit (complete for each Cath Lab visit)  Clinical Evaluation Leading to the Procedure:   ACS: Yes.   Unstable angina  Non-ACS:    Anginal Classification: CCS IV  Anti-ischemic medical therapy: Maximal Therapy (2 or more classes of medications)  Non-Invasive Test Results: No non-invasive testing performed  Prior CABG: Previous CABG        [name]  has presented today for surgery, with the diagnosis of Chest pain.  The various methods of treatment have been discussed with the patient and family. After consideration of risks, benefits and other options for treatment, the patient has consented to  Procedure(s): LEFT HEART CATH AND CORS/GRAFTS ANGIOGRAPHY (N/A) as a surgical intervention.  The patient's history has been reviewed, patient examined, no change in status, stable for surgery.  I have reviewed the patient's chart and labs.  Questions were answered to the patient's satisfaction.     Kathlyn Sacramento

## 2019-07-26 NOTE — Discharge Instructions (Signed)
Call [facility] at [phone] if any bleeding, swelling or drainage at cath site.  May shower, no tub baths for 48 hours for groin sticks. No lifting over 5 pounds for 3 days.  No Driving for 3 days  Heart Healthy Diabetic Diet   Hold your Metformin until 07/29/19 then resume, this allows cath dye to clear your system so it will not interact with metformin.

## 2019-07-26 NOTE — Discharge Summary (Addendum)
Discharge Summary    Patient ID: Hunter Mcbride. MRN: [id number]; DOB: 07/15/1945  Admit date: 07/25/2019 Discharge date: 07/26/2019  Primary Care Provider: Crist Infante, MD  Primary Cardiologist: Mertie Moores, MD  Primary Electrophysiologist:  None   Discharge Diagnoses    Principal Problem:   Unstable angina [facility]) Active Problems:   S/P cardiac cath stable exam of coronary arteries   Hypertension   Coronary atherosclerosis of native coronary artery   Type 2 diabetes mellitus treated without insulin ([place])   Hyperlipidemia LDL goal <70    Diagnostic Studies/Procedures    Cardiac Cath 07/26/19  LIMA graft was visualized by angiography and is normal in caliber.  The graft exhibits no disease.  Prox RCA lesion is 50% stenosed.  Mid RCA lesion is 85% stenosed.  The left ventricular systolic function is normal.  LV end diastolic pressure is mildly elevated.  The left ventricular ejection fraction is 55-65% by visual estimate.  SVG graft was visualized by angiography and is normal in caliber.  The graft exhibits no disease.  2nd Diag lesion is 90% stenosed.  1st Mrg lesion is 80% stenosed.  SVG graft was visualized by angiography and is normal in caliber.  The graft exhibits no disease.  Prox LAD to Mid LAD lesion is 30% stenosed.  Ost LM lesion is 60% stenosed.  Prox Cx to Mid Cx lesion is 80% stenosed.  SVG graft was visualized by angiography.  Prox Graft lesion is 40% stenosed.   1.  Significant underlying three-vessel coronary artery disease with patent grafts including LIMA to LAD, SVG to second diagonal, SVG to OM and SVG to right PL/PDA.  There is progression of native coronary artery disease including the RCA and also ostial left main.  However, the LIMA is no longer atretic. 2.  Normal LV systolic function mildly elevated left ventricular end-diastolic pressure.  Recommendations: No clear explanation of why the patient's anginal symptoms have  worsened.  Recommend continuing aggressive medical therapy.  Treating the ostial left main with PCI might improve the flow to the distal left circumflex.  However, risks probably outweigh the benefit given that the distal distribution is small and does get some retrograde flow from SVG to OM.  Diagnostic Dominance: Right   _____________   History of Present Illness     Hunter Mcbride. is a 74 y.o. male with history ofCAD, HTN, HLD, DM and OSA. He has had prior DES x 3 to the LAD back in 2015. Several caths since. Ended up being referred for CABG x 5 in September of 2018 by [facility]. He isIntolerant to higher doses of nitrates despite multiple attempts. Hadnot wanted to increase Ranexa due to cost. Has had chronic tendency to really overexert himself. It wassurprising how very well his exercise tolerance is and that his angina is typically always at rest - this is chronic. He has had prior echo showing mild dilatation of the aortawith repeat studieswasnot identified. cath 12/2017 with patent grafts.    He was seen in the office 07/25/19 with 2 weeks of episodic cheat pain, usually with exertion and had started with radiation to Left arm.  Described as dull ache.  He took 4 SL NTG the Friday before visit.  Also with nausea and fatigue.  Dr. Acie Fredrickson was concerned wit unstable angina.  No acute EKG changes.  He was admitted to tele and plans for cardiac cath the next day.  Admitted with continued chest pain.   IV heparin and NTG  started.   His insulin and metformin held.    Hospital Course     Consultants: none   Pt did well overnight and troponin neg with reading 4 and 5.  Underwent cardiac cath 07/26/19 and while significant native CAD his grafts are patent.  In fact the LIMA is no longer atretic.  He has normal LV function.  (see above for complete cath details).   No clear explanation of why the patient's anginal symptoms have worsened.  Recommend continuing aggressive medical therapy.   Treating the ostial left main with PCI might improve the flow to the distal left circumflex.  However, risks probably outweigh the benefit given that the distal distribution is small and does get some retrograde flow from SVG to OM.  Pt did well post cath and is stable for discharge.  He has been seen and evaluated by Dr. Harrington Challenger.  Imdur was added after Dr. Harrington Challenger and Dr. Acie Fredrickson discussed.  With stable cardiac cath and no further chest pain it was decided pt was safe to discharge.   Did the patient have an acute coronary syndrome (MI, NSTEMI, STEMI, etc) this admission?:  No                               Did the patient have a percutaneous coronary intervention (stent / angioplasty)?:  No.   _____________  Discharge Vitals Blood pressure 103/61, pulse (!) 52, temperature 98.3 F (36.8 C), temperature source Oral, resp. rate 16, height 5\' 9"  (1.753 m), weight 86.1 kg, SpO2 98 %.  Filed Weights   07/25/19 1515 07/26/19 0500  Weight: 86.9 kg 86.1 kg   General:Pleasant affect, NAD Skin:Warm and dry, brisk capillary refill HEENT:normocephalic, sclera clear, mucus membranes moist Neck:supple, no JVD  Heart:S1S2 RRR without murmur, gallup, rub or click, Lt wrist cath site without hematoma Lungs:clear without rales, rhonchi, or wheezes NTZ:GYFV, non tender, + BS, do not palpate liver spleen or masses Ext:no lower ext edema, 2+ pedal pulses, 2+ radial pulses Neuro:alert and oriented X 3 , MAE, follows commands, + facial symmetry   Labs & Radiologic Studies    CBC Recent Labs    07/25/19 1704 07/26/19 0052  WBC 3.7* 4.0  NEUTROABS 1.8  --   HGB 14.2 14.2  HCT 41.8 42.4  MCV 90.5 90.0  PLT 121* 494*   Basic Metabolic Panel Recent Labs    07/25/19 1704 07/26/19 0052  NA 140 139  K 3.8 4.1  CL 106 104  CO2 25 29  GLUCOSE 140* 148*  BUN 23 23  CREATININE 1.20 0.98  CALCIUM 9.5 9.7  MG 2.2  --    Liver Function Tests Recent Labs    07/25/19 1704  AST 23  ALT 35  ALKPHOS 67    BILITOT 0.8  PROT 6.3*  ALBUMIN 3.9   No results for input(s): LIPASE, AMYLASE in the last 72 hours. High Sensitivity Troponin:   Recent Labs  Lab 07/25/19 1704 07/25/19 1857  TROPONINIHS 5 4    BNP Invalid input(s): POCBNP D-Dimer No results for input(s): DDIMER in the last 72 hours. Hemoglobin A1C Recent Labs    07/26/19 0052  HGBA1C 7.3*   Fasting Lipid Panel Recent Labs    07/26/19 0052  CHOL 96  HDL 35*  LDLCALC 47  TRIG 70  CHOLHDL 2.7   Thyroid Function Tests Recent Labs    07/25/19 1704  TSH 1.532   _____________  CARDIAC CATHETERIZATION  Result Date: 07/26/2019  LIMA graft was visualized by angiography and is normal in caliber.  The graft exhibits no disease.  Prox RCA lesion is 50% stenosed.  Mid RCA lesion is 85% stenosed.  The left ventricular systolic function is normal.  LV end diastolic pressure is mildly elevated.  The left ventricular ejection fraction is 55-65% by visual estimate.  SVG graft was visualized by angiography and is normal in caliber.  The graft exhibits no disease.  2nd Diag lesion is 90% stenosed.  1st Mrg lesion is 80% stenosed.  SVG graft was visualized by angiography and is normal in caliber.  The graft exhibits no disease.  Prox LAD to Mid LAD lesion is 30% stenosed.  Ost LM lesion is 60% stenosed.  Prox Cx to Mid Cx lesion is 80% stenosed.  SVG graft was visualized by angiography.  Prox Graft lesion is 40% stenosed.  1.  Significant underlying three-vessel coronary artery disease with patent grafts including LIMA to LAD, SVG to second diagonal, SVG to OM and SVG to right PL/PDA.  There is progression of native coronary artery disease including the RCA and also ostial left main.  However, the LIMA is no longer atretic. 2.  Normal LV systolic function mildly elevated left ventricular end-diastolic pressure. Recommendations: No clear explanation of why the patient's anginal symptoms have worsened.  Recommend continuing  aggressive medical therapy.  Treating the ostial left main with PCI might improve the flow to the distal left circumflex.  However, risks probably outweigh the benefit given that the distal distribution is small and does get some retrograde flow from SVG to OM.   Disposition   Pt is being discharged home today in good condition.  Follow-up Plans & Appointments   Call [facility] at [phone] if any bleeding, swelling or drainage at cath site.  May shower, no tub baths for 48 hours for groin sticks. No lifting over 5 pounds for 3 days.  No Driving for 3 days  Heart Healthy Diabetic Diet   Hold your Metformin until 07/29/19 then resume, this allows cath dye to clear your system so it will not interact with metformin.     Follow-up Information    Nahser, Wonda Cheng, MD Follow up on 08/16/2019.   Specialty: Cardiology Why: at 11:40 AM Contact information: [place] [address] [place] [address] [phone]                Discharge Medications   Allergies as of 07/26/2019      Reactions   Oxycodone Nausea Only      Medication List    TAKE these medications   acetaminophen 500 MG tablet Commonly known as: TYLENOL Take 1,000 mg by mouth every 8 (eight) hours as needed (pain).   ALPRAZolam 0.5 MG tablet Commonly known as: XANAX Take 0.5 mg by mouth at bedtime as needed for anxiety or sleep.   amLODipine 5 MG tablet Commonly known as: NORVASC Take 1 tablet (5 mg total) by mouth 2 (two) times daily.   aspirin EC 81 MG tablet Take 81 mg by mouth at bedtime.   atenolol 50 MG tablet Commonly known as: TENORMIN Take 1 tablet (50 mg total) by mouth at bedtime.   atorvastatin 80 MG tablet Commonly known as: LIPITOR Take 80 mg by mouth at bedtime.   calcium carbonate 500 MG chewable tablet Commonly known as: TUMS - dosed in mg elemental calcium Chew 2 tablets by mouth as  needed for indigestion or heartburn.   clopidogrel 75 MG  tablet Commonly known as: PLAVIX Take 1 tablet (75 mg total) by mouth daily. What changed: See the new instructions.   docusate sodium 100 MG capsule Commonly known as: COLACE Take 100 mg by mouth at bedtime.   FREESTYLE TEST STRIPS test strip Generic drug: glucose blood 1 each by Other route every morning.   gabapentin 300 MG capsule Commonly known as: NEURONTIN Take 300 mg by mouth at bedtime.   hydrochlorothiazide 25 MG tablet Commonly known as: HYDRODIURIL Take 25 mg by mouth daily.   Invokana 300 MG Tabs tablet Generic drug: canagliflozin Take 300 mg by mouth daily before breakfast.   irbesartan 300 MG tablet Commonly known as: AVAPRO Take 300 mg by mouth at bedtime.   isosorbide mononitrate 30 MG 24 hr tablet Commonly known as: IMDUR Take 1 tablet (30 mg total) by mouth daily.   metFORMIN 500 MG 24 hr tablet Commonly known as: GLUCOPHAGE-XR Take 1 tablet (500 mg total) by mouth 2 (two) times daily. Start taking on: July 29, 2019 What changed: These instructions start on July 29, 2019. If you are unsure what to do until then, ask your doctor or other care provider.   multivitamin tablet Take 1 tablet by mouth daily.   nitroGLYCERIN 0.4 MG SL tablet Commonly known as: NITROSTAT DISSOLVE 1 TABLET UNDER TONGUE AS NEEDEDFOR CHEST PAIN. MAY REPEAT 5 MINUTES APART 3 TIMES IF NEEDED What changed: See the new instructions.   NONFORMULARY OR COMPOUNDED ITEM Apply 1 application topically See admin instructions. Antifungal solution: Terbinafine 3%, Fluconazole 2%, Tea Tree Oil 5%, Urea 10%, Ibuprofen 2% in DMSO suspension: Apply to the toenails daily as directed   pantoprazole 40 MG tablet Commonly known as: PROTONIX Take 1 tablet (40 mg total) by mouth 2 (two) times daily before a meal.   polyethylene glycol 17 g packet Commonly known as: MIRALAX / GLYCOLAX Take 17 g by mouth daily as needed for mild constipation (MIX AND DRINK).   promethazine 25 MG  tablet Commonly known as: PHENERGAN Take 12.5 mg by mouth every 6 (six) hours as needed for nausea or vomiting.   ranolazine 500 MG 12 hr tablet Commonly known as: Ranexa Take 1 tablet (500 mg total) by mouth 2 (two) times daily.   Tyler Aas FlexTouch 200 UNIT/ML FlexTouch Pen Generic drug: insulin degludec Inject 18 Units into the skin daily before breakfast.   Vitamin D3 Super Strength 50 MCG (2000 UT) Caps Generic drug: Cholecalciferol Take 2,000 Units by mouth daily.          Outstanding Labs/Studies   none  Duration of Discharge Encounter   Greater than 30 minutes including physician time.  Signed, Cecilie Kicks, NP 07/26/2019, 3:25 PM

## 2019-07-26 NOTE — Progress Notes (Signed)
ANTICOAGULATION CONSULT NOTE - Follow Up Consult  Pharmacy Consult for heparin Indication: USAP  Labs: Recent Labs    07/25/19 1704 07/25/19 1857 07/26/19 0052 07/26/19 0800  HGB 14.2  --  14.2  --   HCT 41.8  --  42.4  --   PLT 121*  --  127*  --   LABPROT 12.9  --   --   --   INR 1.0  --   --   --   HEPARINUNFRC  --   --  0.28* 0.57  CREATININE 1.20  --  0.98  --   TROPONINIHS 5 4  --   --     Assessment: 74yo male on heparin for unstable angina planned for cardiac cath 6/11.  HL up to 0.57, therapeutic. H/H, plt stable. Will decrease rate slightly to prevent supratherapeutic level.    Goal of Therapy:  Heparin level 0.3-0.7 units/ml   Plan:  Decrease Heparin to 1150 units/hr   Monitor daily HL, CBC/plt Monitor for signs/symptoms of bleeding  F/u post-cath plan   Benetta Spar, PharmD, BCPS, [facility] Clinical Pharmacist  Please check AMION for all [place] phone numbers After 10:00 PM, call [place] [phone]

## 2019-08-15 ENCOUNTER — Encounter: Payer: Self-pay | Admitting: Cardiovascular Disease

## 2019-08-15 NOTE — Progress Notes (Signed)
Hunter Mcbride. Date of Birth  1945-05-02 [place]  [address]    [place]   [place], [place]  [address]    [place], [place]  [address] [phone]  Fax  [phone]  [phone]  Fax [phone]  Problem list: 1. History of chest pain-negative stress Myoview study in April, 2010 2. Hyperlipidemia 3. Hypertension 4. Diabetes mellitus 5. Sleep apnea   Hunter Mcbride is doing well.  His quite active. 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 He's not had any episodes of chest pain, shortness breath, syncope, or presyncope.  06/05/2013:    Hunter Mcbride is doing well.  He has been [place] this am.   No CP or dyspnea.  He has noticed that he has some mild vague chest and arm ache - especially with walking.  He can usually walk through the discomfort and the pain will eventually resolve.  He noticed this discomfort this am while hunting.   He has also noticed this while push mowing.   These have been going on for about 1 year.    He had a myoview in 2012 which was negative.    12/04/2015: Hunter Mcbride is doing well. He he has seen Hunter Mcbride for the past 2-1/2 years. Is seen with wife, Hunter Mcbride,   Had a cath in June, 2017.   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   Went to the ER in Sept with angina . Thought it may be indigestion.   Was taking NTG .     Imdur was increased . Seems to be improved at this point  Has some orthostatic hypotension because of the increased Imdur dosing .  Is on Ranexa 500 BID   July 25, 2019: Hunter Mcbride is seen back today for evaluation of chest pain.  He has a history of coronary artery disease. Presents with 2 weeks of episodic CP Usually with exertion . Has started radiating to left arm.  Dull ache.  Has significant intrascapular pain .   Dull ache with hot sensation  Took 4 SL NTG on Friday ,  Would ease up for several minutes.  Woke up  frequently Friday night with recurrent cp Had chest soreness since then  Has continued to have these CP  Associated with nausea,   Is very fatigued.  He sat around yesterday ,  Pain eased off.  Last night took more SL nitro, tylenol  August 16, 2019:  Hunter Mcbride is seen today for follow-up visit.  He presented to me last month with episodes of chest discomfort.  We referred him for heart catheterization.  Heart catheterization reveals three-vessel native coronary artery disease with patent grafts including LIMA to LAD, SVG to D2, SVG to OM and SVG to RCA/PDA.  There is been some progression of native coronary artery disease including the RCA but also the ostial left main.  The LIMA is no longer atretic.  No specific reason for the patient's increased anginal symptoms was found.  Medical therapy was continued.  Seems to be doing much better at this point  No further episodes of angina at this point .  Is doing much better in Imdur 30 mg ( which he takes at night )    Current Outpatient Medications on File Prior to Visit  Medication Sig Dispense Refill  . acetaminophen (TYLENOL) 500 MG tablet Take 1,000 mg by mouth  every 8 (eight) hours as needed (pain).     [name] ALPRAZolam (XANAX) 0.5 MG tablet Take 0.5 mg by mouth at bedtime as needed for anxiety or sleep.     [name] amLODipine (NORVASC) 5 MG tablet Take 1 tablet (5 mg total) by mouth 2 (two) times daily.    [name] aspirin EC 81 MG tablet Take 81 mg by mouth at bedtime.     [name] atenolol (TENORMIN) 50 MG tablet Take 1 tablet (50 mg total) by mouth at bedtime. 90 tablet 3  . atorvastatin (LIPITOR) 80 MG tablet Take 80 mg by mouth at bedtime.     . calcium carbonate (TUMS - DOSED IN MG ELEMENTAL CALCIUM) 500 MG chewable tablet Chew 2 tablets by mouth as needed for indigestion or heartburn.     . canagliflozin (INVOKANA) 300 MG TABS tablet Take 300 mg by mouth daily before breakfast.    . Cholecalciferol (VITAMIN D3 SUPER STRENGTH) 50 MCG (2000 UT) CAPS Take 2,000  Units by mouth daily.    . clopidogrel (PLAVIX) 75 MG tablet Take 1 tablet (75 mg total) by mouth daily.    [name] docusate sodium (COLACE) 100 MG capsule Take 100 mg by mouth at bedtime.     [name] FREESTYLE TEST STRIPS test strip 1 each by Other route every morning.     . gabapentin (NEURONTIN) 300 MG capsule Take 300 mg by mouth at bedtime.     . hydrochlorothiazide (HYDRODIURIL) 25 MG tablet Take 25 mg by mouth daily.    . insulin degludec (TRESIBA FLEXTOUCH) 200 UNIT/ML FlexTouch Pen Inject 18 Units into the skin daily before breakfast.     . irbesartan (AVAPRO) 300 MG tablet Take 300 mg by mouth at bedtime.     . metFORMIN (GLUCOPHAGE-XR) 500 MG 24 hr tablet Take 1 tablet (500 mg total) by mouth 2 (two) times daily.    . Multiple Vitamin (MULTIVITAMIN) tablet Take 1 tablet by mouth daily.    . nitroGLYCERIN (NITROSTAT) 0.4 MG SL tablet DISSOLVE 1 TABLET UNDER TONGUE AS NEEDEDFOR CHEST PAIN. MAY REPEAT 5 MINUTES APART 3 TIMES IF NEEDED (Patient taking differently: Place 0.4 mg under the tongue every 5 (five) minutes x 3 doses as needed for chest pain. ) 25 tablet 3  . NONFORMULARY OR COMPOUNDED ITEM Apply 1 application topically See admin instructions. Antifungal solution: Terbinafine 3%, Fluconazole 2%, Tea Tree Oil 5%, Urea 10%, Ibuprofen 2% in DMSO suspension: Apply to the toenails daily as directed    . pantoprazole (PROTONIX) 40 MG tablet Take 1 tablet (40 mg total) by mouth 2 (two) times daily before a meal. 60 tablet 1  . polyethylene glycol (MIRALAX / GLYCOLAX) packet Take 17 g by mouth daily as needed for mild constipation (MIX AND DRINK).     . promethazine (PHENERGAN) 25 MG tablet Take 12.5 mg by mouth every 6 (six) hours as needed for nausea or vomiting.     . ranolazine (RANEXA) 500 MG 12 hr tablet Take 1 tablet (500 mg total) by mouth 2 (two) times daily. 180 tablet 3   No current facility-administered medications on file prior to visit.    Allergies  Allergen Reactions  . Oxycodone  Nausea Only    Past Medical History:  Diagnosis Date  . Arthritis    "lower back; right shoulder; left thumb; joints" (06/21/2013)  . Asthma    "not sure if this is true or not" (06/21/2013)  . CAD (coronary artery disease) 06/2013   s/p PTCA/DES x  3 to mid LAD 2015. b. s/p CABG in 2018. c. cath 12/2017 -> med rx.  [name] GERD (gastroesophageal reflux disease)   . Gout    "maybe twice in my life"  . Headache(784.0)    "weekly for the last 3-4 months" (06/21/2013)  . Hyperlipidemia   . Hypertension   . Myocardial infarction [facility])    "/Dr. Katharina Caper I've had one between 2014-2015) (06/21/2013)  . Osteoarthritis   . Sleep apnea    WEIGHT LOSS, NO LONGER NEEDS PER PATIENT  . Type II diabetes mellitus ([place])    TYPE 2    Past Surgical History:  Procedure Laterality Date  . CARDIAC CATHETERIZATION  1980's   "once"  . CARDIAC CATHETERIZATION N/A 07/23/2015   Procedure: Left Heart Cath and Coronary Angiography;  Surgeon: Sherren Mocha, MD;  Location: [facility];  Service: Cardiovascular;  Laterality: N/A;  . CARDIOVASCULAR STRESS TEST  06/10/2008   EF 68%  . CARPAL TUNNEL RELEASE Bilateral   . CORONARY ANGIOPLASTY WITH STENT PLACEMENT  06/21/2013   "3"  . CORONARY ARTERY BYPASS GRAFT N/A 11/07/2016   Procedure: CORONARY ARTERY BYPASS GRAFTING (CABG) x 5 (LIMA to DISTAL LAD, SVG to DIAGONAL, SVG to CIRCUMFLEX, and SVG SEQUENTIALLY to PLB and DISTAL PDA) with EVH of the RIGHT GREATER SAPHENOUS VEIN and LEFT INTERNAL MAMMARY ARTERY HARVEST;  Surgeon: Grace Isaac, MD;  Location: [facility];  Service: Open Heart Surgery;  Laterality: N/A;  . CORONARY STENT INTERVENTION N/A 06/07/2016   Procedure: Coronary Stent Intervention;  Surgeon: Burnell Blanks, MD;  Location: [facility];  Service: Cardiovascular;  Laterality: N/A;  . EXCISION MORTON'S NEUROMA Left   . EYE SURGERY Left    "removed film over my eye"  . FRACTIONAL FLOW RESERVE WIRE N/A 06/26/2013   Procedure: FRACTIONAL FLOW  RESERVE WIRE;  Surgeon: [name], MD;  Location: [facility];  Service: Cardiovascular;  Laterality: N/A;  . HERNIA REPAIR Left   . INTRAVASCULAR PRESSURE WIRE/FFR STUDY N/A 10/28/2016   Procedure: INTRAVASCULAR PRESSURE WIRE/FFR STUDY;  Surgeon: Leonie Man, MD;  Location: [facility];  Service: Cardiovascular;  Laterality: N/A;  . JOINT REPLACEMENT Left 03/2013   "thumb"  . LEFT HEART CATH AND CORONARY ANGIOGRAPHY N/A 06/07/2016   Procedure: Left Heart Cath and Coronary Angiography;  Surgeon: Burnell Blanks, MD;  Location: [facility];  Service: Cardiovascular;  Laterality: N/A;  . LEFT HEART CATH AND CORONARY ANGIOGRAPHY N/A 10/28/2016   Procedure: LEFT HEART CATH AND CORONARY ANGIOGRAPHY;  Surgeon: Leonie Man, MD;  Location: [facility];  Service: Cardiovascular;  Laterality: N/A;  . LEFT HEART CATH AND CORS/GRAFTS ANGIOGRAPHY N/A 12/25/2017   Procedure: LEFT HEART CATH AND CORS/GRAFTS ANGIOGRAPHY;  Surgeon: Sherren Mocha, MD;  Location: [facility];  Service: Cardiovascular;  Laterality: N/A;  . LEFT HEART CATH AND CORS/GRAFTS ANGIOGRAPHY N/A 07/26/2019   Procedure: LEFT HEART CATH AND CORS/GRAFTS ANGIOGRAPHY;  Surgeon: [name], MD;  Location: [facility];  Service: Cardiovascular;  Laterality: N/A;  . LEFT HEART CATHETERIZATION WITH CORONARY ANGIOGRAM N/A 06/21/2013   Procedure: LEFT HEART CATHETERIZATION WITH CORONARY ANGIOGRAM;  Surgeon: Burnell Blanks, MD;  [facility];  Service: Cardiovascular;  Laterality: N/A;  . LEFT HEART CATHETERIZATION WITH CORONARY ANGIOGRAM N/A 06/26/2013   Procedure: LEFT HEART CATHETERIZATION WITH CORONARY ANGIOGRAM;  Surgeon: [name], MD;  Location: [facility];  Service: Cardiovascular;  Laterality:  N/A;  . LUMBAR LAMINECTOMY/DECOMPRESSION MICRODISCECTOMY Left 05/30/2018   Procedure: Left  Gill L5 decompression;  Surgeon: Melina Schools, MD;  Location: [facility];  Service:  Orthopedics;  Laterality: Left;  192min  . TEE WITHOUT CARDIOVERSION N/A 11/07/2016   Procedure: TRANSESOPHAGEAL ECHOCARDIOGRAM (TEE);  Surgeon: Grace Isaac, MD;  Location: [facility];  Service: Open Heart Surgery;  Laterality: N/A;  . TONSILLECTOMY    . TOTAL HIP ARTHROPLASTY Right 04/12/2016   Procedure: RIGHT TOTAL HIP ARTHROPLASTY ANTERIOR APPROACH;  Surgeon: Paralee Cancel, MD;  Location: [facility];  Service: Orthopedics;  Laterality: Right;  requests 70 mins  . TOTAL SHOULDER ARTHROPLASTY  03/01/2012   Procedure: TOTAL SHOULDER ARTHROPLASTY;  Surgeon: Marin Shutter, MD;  Location: [facility];  Service: Orthopedics;  Laterality: Right;  . UMBILICAL HERNIA REPAIR      Social History   Tobacco Use  Smoking Status Never Smoker  Smokeless Tobacco Never Used    Social History   Substance and Sexual Activity  Alcohol Use No    Family History  Problem Relation Age of Onset  . Diabetes type II Mother   . Heart attack Father   . Coronary artery disease Cousin   . Heart disease Brother     Reviw of Systems:  Reviewed in the HPI.  All other systems are negative.  Physical Exam: Blood pressure 100/68, pulse (!) 56, height 5\' 9"  (1.753 m), weight 191 lb 3.2 oz (86.7 kg), SpO2 98 %.  GEN:  Well nourished, well developed in no acute distress HEENT: Normal NECK: No JVD; No carotid bruits LYMPHATICS: No lymphadenopathy CARDIAC: RRR,  No murmurs  RESPIRATORY:  Clear to auscultation without rales, wheezing or rhonchi  ABDOMEN: Soft, non-tender, non-distended MUSCULOSKELETAL:  No edema; No deformity  SKIN: Warm and dry NEUROLOGIC:  Alert and oriented x 3   ECG:      Assessment / Plan:   1. Coronary artery disease: His angina seems to be much better on isosorbide 30 mg a day.  We will continue current medications.  2. Hyperlipidemia: Lipids been fairly well controlled.  Continue current medications.  We will recheck labs in 6 months when he sees Truitt Merle, NP.  3.  Diabetes  [place]: Further management per his primary medical doctor.    Mertie Moores, MD  08/16/2019 12:35 PM    [place],  [place], [place]  [address] Phone: [phone]; Fax: [phone]

## 2019-08-16 ENCOUNTER — Other Ambulatory Visit: Payer: Self-pay

## 2019-08-16 ENCOUNTER — Ambulatory Visit: Payer: PPO | Admitting: Cardiovascular Disease

## 2019-08-16 ENCOUNTER — Encounter: Payer: Self-pay | Admitting: Cardiovascular Disease

## 2019-08-16 VITALS — BP 100/68 | HR 56 | Ht 69.0 in | Wt 191.2 lb

## 2019-08-16 DIAGNOSIS — I25110 Atherosclerotic heart disease of native coronary artery with unstable angina pectoris: Secondary | ICD-10-CM

## 2019-08-16 DIAGNOSIS — I10 Essential (primary) hypertension: Secondary | ICD-10-CM | POA: Diagnosis not present

## 2019-08-16 DIAGNOSIS — Z951 Presence of aortocoronary bypass graft: Secondary | ICD-10-CM

## 2019-08-16 DIAGNOSIS — I200 Unstable angina: Secondary | ICD-10-CM

## 2019-08-16 DIAGNOSIS — I2510 Atherosclerotic heart disease of native coronary artery without angina pectoris: Secondary | ICD-10-CM | POA: Diagnosis not present

## 2019-08-16 DIAGNOSIS — E785 Hyperlipidemia, unspecified: Secondary | ICD-10-CM | POA: Diagnosis not present

## 2019-08-16 MED ORDER — ISOSORBIDE MONONITRATE ER 30 MG PO TB24: 30.0000 mg | ORAL_TABLET | Freq: Every day | ORAL | 3 refills | Status: DC

## 2019-08-16 NOTE — Patient Instructions (Addendum)
Medication Instructions:  Your physician recommends that you continue on your current medications as directed. Please refer to the Current Medication list given to you today.  *If you need a refill on your cardiac medications before your next appointment, please call your pharmacy*   Lab Work: Your physician recommends that you return for lab work in: 6 months on Wed. Jan. 5 You will need to FAST for this appointment - nothing to eat or drink after midnight the night before except water.  If you have labs (blood work) drawn today and your tests are completely normal, you will receive your results only by: [name] MyChart Message (if you have MyChart) OR . A paper copy in the mail If you have any lab test that is abnormal or we need to change your treatment, we will call you to review the results.   Testing/Procedures: None Ordered   Follow-Up: At [facility], you and your health needs are our priority.  As part of our continuing mission to provide you with exceptional heart care, we have created designated Provider Care Teams.  These Care Teams include your primary Cardiologist (physician) and Advanced Practice Providers (APPs -  Physician Assistants and Nurse Practitioners) who all work together to provide you with the care you need, when you need it.   Your next appointment:   6 month(s)  The format for your next appointment:   In Person  Provider:   Truitt Merle, NP

## 2019-08-30 ENCOUNTER — Other Ambulatory Visit: Payer: Self-pay | Admitting: Nurse Practitioner

## 2019-09-09 ENCOUNTER — Ambulatory Visit: Payer: PPO | Admitting: Podiatry

## 2019-09-11 ENCOUNTER — Telehealth: Payer: Self-pay | Admitting: *Deleted

## 2019-09-11 NOTE — Telephone Encounter (Signed)
   [facility] Pre-operative Risk Assessment    HEARTCARE STAFF: - Please ensure there is not already an duplicate clearance open for this procedure. - Under Visit Info/Reason for Call, type in Other and utilize the format Clearance MM/DD/YY or Clearance TBD. Do not use dashes or single digits. - If request is for dental extraction, please clarify the # of teeth to be extracted.  Request for surgical clearance:  1. What type of surgery is being performed? COLONOSCOPY   2. When is this surgery scheduled? 11/19/19   3. What type of clearance is required (medical clearance vs. Pharmacy clearance to hold med vs. Both)? MEDICAL  4. Are there any medications that need to be held prior to surgery and how [place]? ASA AND PLAVIX   5. Practice name and name of physician performing surgery? [facility]; DR. JEFFREY MEDOFF   6. What is the office phone number? [phone]   7.   What is the office fax number? [phone]  8.   Anesthesia type (None, local, MAC, general) ? NOT LISTED   Julaine Hua 09/11/2019, 4:14 PM  _________________________________________________________________   (provider comments below)

## 2019-09-12 NOTE — Telephone Encounter (Signed)
Hunter Mcbride. [name]. 74 year old male would like to have a colonoscopy. He was last seen in the clinic on 7-21. During that time he was doing well. May we hold his aspirin and Plavix prior to the procedure?  His PMH includes coronary artery disease, HLD, HTN, diabetes mellitus, and obstructive sleep apnea.  He was seen in June due to episodes of chest discomfort. He was referred for LHC at that time. His cardiac catheterization showed three-vessel native coronary artery disease with patent grafts LAD-LIMA, SVG-D2, SVG-OM, and SVG-RCA/PDA. No cardiac reasons for his chest discomfort were identified. He was started on Imdur 30 mg. He was tolerating his Imdur well.  Please direct your response to CV DIV preop pool. Thank you for your help.  Jossie Ng. Caycee Wanat NP-C    09/12/2019, 7:27 AM [place] [address] Office [phone] Fax [phone]

## 2019-09-12 NOTE — Telephone Encounter (Signed)
Pt may hold plavix and ASA for 5-7 days prior to colonoscopy

## 2019-09-12 NOTE — Telephone Encounter (Signed)
° °  Primary Cardiologist: Mertie Moores, MD  Chart reviewed as part of pre-operative protocol coverage. Given past medical history and time since last visit, based on ACC/AHA guidelines, Hunter Mcbride. would be at acceptable risk for the planned procedure without further cardiovascular testing.   He may hold his Plavix and aspirin for 5-7 days prior to his colonoscopy. Please resume as soon as hemostasis is achieved.  I will route this recommendation to the requesting party via Epic fax function and remove from pre-op pool.  Please call with questions.  Jossie Ng. Brent Taillon NP-C    09/12/2019, 1:45 PM [facility] [place] [address] Office [phone] Fax [phone]

## 2019-09-17 ENCOUNTER — Other Ambulatory Visit: Payer: Self-pay | Admitting: Nurse Practitioner

## 2019-09-26 ENCOUNTER — Ambulatory Visit: Payer: PPO | Admitting: Cardiovascular Disease

## 2019-10-15 DIAGNOSIS — L820 Inflamed seborrheic keratosis: Secondary | ICD-10-CM | POA: Diagnosis not present

## 2019-10-15 DIAGNOSIS — L821 Other seborrheic keratosis: Secondary | ICD-10-CM | POA: Diagnosis not present

## 2019-10-15 DIAGNOSIS — Z85828 Personal history of other malignant neoplasm of skin: Secondary | ICD-10-CM | POA: Diagnosis not present

## 2019-10-15 DIAGNOSIS — D1801 Hemangioma of skin and subcutaneous tissue: Secondary | ICD-10-CM | POA: Diagnosis not present

## 2019-10-15 DIAGNOSIS — Z125 Encounter for screening for malignant neoplasm of prostate: Secondary | ICD-10-CM | POA: Diagnosis not present

## 2019-10-15 DIAGNOSIS — E1169 Type 2 diabetes mellitus with other specified complication: Secondary | ICD-10-CM | POA: Diagnosis not present

## 2019-10-15 DIAGNOSIS — D692 Other nonthrombocytopenic purpura: Secondary | ICD-10-CM | POA: Diagnosis not present

## 2019-10-15 DIAGNOSIS — E785 Hyperlipidemia, unspecified: Secondary | ICD-10-CM | POA: Diagnosis not present

## 2019-10-22 DIAGNOSIS — Z0000 Encounter for general adult medical examination without abnormal findings: Secondary | ICD-10-CM | POA: Diagnosis not present

## 2019-10-22 DIAGNOSIS — Z1212 Encounter for screening for malignant neoplasm of rectum: Secondary | ICD-10-CM | POA: Diagnosis not present

## 2019-10-22 DIAGNOSIS — F418 Other specified anxiety disorders: Secondary | ICD-10-CM | POA: Diagnosis not present

## 2019-10-22 DIAGNOSIS — I2510 Atherosclerotic heart disease of native coronary artery without angina pectoris: Secondary | ICD-10-CM | POA: Diagnosis not present

## 2019-10-22 DIAGNOSIS — R1310 Dysphagia, unspecified: Secondary | ICD-10-CM | POA: Diagnosis not present

## 2019-10-22 DIAGNOSIS — M1990 Unspecified osteoarthritis, unspecified site: Secondary | ICD-10-CM | POA: Diagnosis not present

## 2019-10-22 DIAGNOSIS — R82998 Other abnormal findings in urine: Secondary | ICD-10-CM | POA: Diagnosis not present

## 2019-10-22 DIAGNOSIS — E1169 Type 2 diabetes mellitus with other specified complication: Secondary | ICD-10-CM | POA: Diagnosis not present

## 2019-10-22 DIAGNOSIS — E785 Hyperlipidemia, unspecified: Secondary | ICD-10-CM | POA: Diagnosis not present

## 2019-10-22 DIAGNOSIS — I10 Essential (primary) hypertension: Secondary | ICD-10-CM | POA: Diagnosis not present

## 2019-10-22 DIAGNOSIS — J45909 Unspecified asthma, uncomplicated: Secondary | ICD-10-CM | POA: Diagnosis not present

## 2019-10-22 DIAGNOSIS — R6882 Decreased libido: Secondary | ICD-10-CM | POA: Diagnosis not present

## 2019-10-30 ENCOUNTER — Ambulatory Visit: Payer: PPO | Admitting: Nurse Practitioner

## 2019-11-26 ENCOUNTER — Other Ambulatory Visit: Payer: Self-pay

## 2019-11-26 ENCOUNTER — Encounter: Payer: Self-pay | Admitting: Podiatry

## 2019-11-26 ENCOUNTER — Ambulatory Visit: Payer: PPO | Admitting: Podiatry

## 2019-11-26 DIAGNOSIS — E1142 Type 2 diabetes mellitus with diabetic polyneuropathy: Secondary | ICD-10-CM | POA: Diagnosis not present

## 2019-11-26 DIAGNOSIS — B351 Tinea unguium: Secondary | ICD-10-CM

## 2019-11-26 DIAGNOSIS — M79676 Pain in unspecified toe(s): Secondary | ICD-10-CM

## 2019-12-01 NOTE — Progress Notes (Signed)
Subjective: Hunter Mcbride. presents today for follow up of preventative diabetic foot care and painful mycotic nails b/l that are difficult to trim. Pain interferes with ambulation. Aggravating factors include wearing enclosed shoe gear. Pain is relieved with periodic professional debridement.   He voices no new pedal concerns on today's visit.  Allergies  Allergen Reactions  . Oxycodone Nausea Only    Objective: There were no vitals filed for this visit.  Hunter Mcbride is a pleasant  74 y.o. year old Caucasian male, WD, WN in NAD. AAO x 3.   Vascular Examination:  Capillary refill time to digits immediate b/l. Palpable DP pulses b/l. Palpable PT pulses b/l. Pedal hair present b/l. Skin temperature gradient within normal limits b/l. No edema noted b/l.  Dermatological Examination: Pedal skin with normal turgor, texture and tone bilaterally. No open wounds bilaterally. No interdigital macerations bilaterally. Toenails 1-5 b/l elongated, dystrophic, thickened, crumbly with subungual debris and tenderness to dorsal palpation.  Musculoskeletal: Normal muscle strength 5/5 to all lower extremity muscle groups bilaterally. No pain crepitus or joint limitation noted with ROM b/l. Hammertoes noted to the 2-5 bilaterally.  Neurological: Protective sensation decreased with 10 gram monofilament b/l. Proprioception intact bilaterally. Babinski reflex negative b/l. Clonus negative b/l.  Assessment: 1. Pain due to onychomycosis of toenail   2. Diabetic peripheral neuropathy associated with type 2 diabetes mellitus ([place])    Plan: -Examined patient. -No new findings. No new orders. -Continue diabetic foot care principles. -Toenails 1-5 b/l were debrided in length and girth with sterile nail nippers and dremel without iatrogenic bleeding.  -Patient to report any pedal injuries to medical professional immediately. -Patient to continue soft, supportive shoe gear daily. -Patient/POA to call should  there be question/concern in the interim.  Return in about 3 months (around 02/26/2020).

## 2020-01-13 NOTE — Progress Notes (Signed)
CARDIOLOGY OFFICE NOTE  Date:  01/27/2020    Hunter Mcbride. Date of Birth: [birth year] Medical Record #[id number]  PCP:  Crist Infante, MD  Cardiologist:  Servando Snare & Nahser/Cooper   Chief Complaint  Patient presents with  . Follow-up    Seen for Dr. Nahser/Cooper    History of Present Illness: Hunter Ciullo. is a 74 y.o. male who presents today for a 5 month check. Seen for Dr. Burt Knack & Nahser.   He has along standinghistory ofCAD, HTN, HLD, DM and OSA. He has had prior DES x 3 to the LAD back in 2015. Several caths since. Ended up being referred for CABG x 5 in September of 2018 by [facility]. He isIntolerant to higher doses of nitrates despite multiple attempts. Hadnot wanted to increase Ranexa due to cost. Has had chronic tendency to really overexert himself. It wassurprising how very well his exercise tolerance is and that his angina is typically always at rest - this is chronic. He has had prior echo showing mild dilatation of the aortabut not identified on repeat studies.  He has had repeat cath since his CABGin November of 2019- to continue with medical management.   He had emergent back surgery in early 2020due to low back, buttock, radicular left leg pain - failed on conservative treatment and had left L5 foraminotomy.We did a telehealth visit back in May - he was doing ok - was getting ready to have lumbar fusion in June at [facility] - this was complicated by post op infection that required a wound vac. Was on chronic narcotics. We have put him back on Ranexa. Last visit with me was in March.   Seen as a work in back this summer by Dr. Acie Fredrickson with chest pain - ended up being recathed again - to again, medically manage. Imdur added back and on follow up, he was doing well.   Comes in today. Here with his wife - I am seeing her today as well. He is doing well. Went to [place] to hunt. Feels good. No chest pain. Labs done by Dr. Joylene Draft - look good. BP is good. They  are both happy with how he is doing.   Past Medical History:  Diagnosis Date  . Arthritis    "lower back; right shoulder; left thumb; joints" (06/21/2013)  . Asthma    "not sure if this is true or not" (06/21/2013)  . CAD (coronary artery disease) 06/2013   s/p PTCA/DES x 3 to mid LAD 2015. b. s/p CABG in 2018. c. cath 12/2017 -> med rx.  [name] GERD (gastroesophageal reflux disease)   . Gout    "maybe twice in my life"  . Headache(784.0)    "weekly for the last 3-4 months" (06/21/2013)  . Hyperlipidemia   . Hypertension   . Myocardial infarction [facility])    "/Dr. Katharina Caper I've had one between 2014-2015) (06/21/2013)  . Osteoarthritis   . Sleep apnea    WEIGHT LOSS, NO LONGER NEEDS PER PATIENT  . Type II diabetes mellitus ([place])    TYPE 2    Past Surgical History:  Procedure Laterality Date  . CARDIAC CATHETERIZATION  1980's   "once"  . CARDIAC CATHETERIZATION N/A 07/23/2015   Procedure: Left Heart Cath and Coronary Angiography;  Surgeon: Sherren Mocha, MD;  Location: [place] [facility];  Service: Cardiovascular;  Laterality: N/A;  . CARDIOVASCULAR STRESS TEST  06/10/2008   EF 68%  . CARPAL TUNNEL RELEASE Bilateral   .  CORONARY ANGIOPLASTY WITH STENT PLACEMENT  06/21/2013   "3"  . CORONARY ARTERY BYPASS GRAFT N/A 11/07/2016   Procedure: CORONARY ARTERY BYPASS GRAFTING (CABG) x 5 (LIMA to DISTAL LAD, SVG to DIAGONAL, SVG to CIRCUMFLEX, and SVG SEQUENTIALLY to PLB and DISTAL PDA) with EVH of the RIGHT GREATER SAPHENOUS VEIN and LEFT INTERNAL MAMMARY ARTERY HARVEST;  Surgeon: Grace Isaac, MD;  Location: [place];  Service: Open Heart Surgery;  Laterality: N/A;  . CORONARY STENT INTERVENTION N/A 06/07/2016   Procedure: Coronary Stent Intervention;  Surgeon: Burnell Blanks, MD;  Location: [place] [facility];  Service: Cardiovascular;  Laterality: N/A;  . EXCISION MORTON'S NEUROMA Left   . EYE SURGERY Left    "removed film over my eye"  . FRACTIONAL FLOW RESERVE WIRE N/A 06/26/2013    Procedure: FRACTIONAL FLOW RESERVE WIRE;  Surgeon: [name], MD;  Location: [place] CATH LAB;  Service: Cardiovascular;  Laterality: N/A;  . HERNIA REPAIR Left   . INTRAVASCULAR PRESSURE WIRE/FFR STUDY N/A 10/28/2016   Procedure: INTRAVASCULAR PRESSURE WIRE/FFR STUDY;  Surgeon: Leonie Man, MD;  Location: [place] [facility];  Service: Cardiovascular;  Laterality: N/A;  . JOINT REPLACEMENT Left 03/2013   "thumb"  . LEFT HEART CATH AND CORONARY ANGIOGRAPHY N/A 06/07/2016   Procedure: Left Heart Cath and Coronary Angiography;  Surgeon: Burnell Blanks, MD;  Location: [place] [facility];  Service: Cardiovascular;  Laterality: N/A;  . LEFT HEART CATH AND CORONARY ANGIOGRAPHY N/A 10/28/2016   Procedure: LEFT HEART CATH AND CORONARY ANGIOGRAPHY;  Surgeon: Leonie Man, MD;  Location: [place] [facility];  Service: Cardiovascular;  Laterality: N/A;  . LEFT HEART CATH AND CORS/GRAFTS ANGIOGRAPHY N/A 12/25/2017   Procedure: LEFT HEART CATH AND CORS/GRAFTS ANGIOGRAPHY;  Surgeon: Sherren Mocha, MD;  Location: [place] [facility];  Service: Cardiovascular;  Laterality: N/A;  . LEFT HEART CATH AND CORS/GRAFTS ANGIOGRAPHY N/A 07/26/2019   Procedure: LEFT HEART CATH AND CORS/GRAFTS ANGIOGRAPHY;  Surgeon: [name], MD;  Location: [place] [facility];  Service: Cardiovascular;  Laterality: N/A;  . LEFT HEART CATHETERIZATION WITH CORONARY ANGIOGRAM N/A 06/21/2013   Procedure: LEFT HEART CATHETERIZATION WITH CORONARY ANGIOGRAM;  Surgeon: Burnell Blanks, MD;  [facility];  Service: Cardiovascular;  Laterality: N/A;  . LEFT HEART CATHETERIZATION WITH CORONARY ANGIOGRAM N/A 06/26/2013   Procedure: LEFT HEART CATHETERIZATION WITH CORONARY ANGIOGRAM;  Surgeon: [name], MD;  Location: [place] CATH LAB;  Service: Cardiovascular;  Laterality: N/A;  . LUMBAR LAMINECTOMY/DECOMPRESSION MICRODISCECTOMY Left 05/30/2018   Procedure: Left  Gill L5 decompression;  Surgeon: Melina Schools, MD;   Location: [place];  Service: Orthopedics;  Laterality: Left;  116min  . TEE WITHOUT CARDIOVERSION N/A 11/07/2016   Procedure: TRANSESOPHAGEAL ECHOCARDIOGRAM (TEE);  Surgeon: Grace Isaac, MD;  Location: [place];  Service: Open Heart Surgery;  Laterality: N/A;  . TONSILLECTOMY    . TOTAL HIP ARTHROPLASTY Right 04/12/2016   Procedure: RIGHT TOTAL HIP ARTHROPLASTY ANTERIOR APPROACH;  Surgeon: Paralee Cancel, MD;  Location: [facility];  Service: Orthopedics;  Laterality: Right;  requests 70 mins  . TOTAL SHOULDER ARTHROPLASTY  03/01/2012   Procedure: TOTAL SHOULDER ARTHROPLASTY;  Surgeon: Marin Shutter, MD;  Location: [place];  Service: Orthopedics;  Laterality: Right;  . UMBILICAL HERNIA REPAIR       Medications: Current Meds  Medication Sig  . acetaminophen (TYLENOL) 500 MG tablet Take 1,000 mg by mouth every 8 (eight) hours as needed (pain).   [name]  ALPRAZolam (XANAX) 0.5 MG tablet Take 0.5 mg by mouth at bedtime as needed for anxiety or sleep.   [name] amLODipine (NORVASC) 5 MG tablet TAKE ONE TABLET BY MOUTH TWICE DAILY  . aspirin EC 81 MG tablet Take 81 mg by mouth at bedtime.   [name] atenolol (TENORMIN) 50 MG tablet Take 1 tablet (50 mg total) by mouth at bedtime.  [name] atorvastatin (LIPITOR) 80 MG tablet Take 80 mg by mouth at bedtime.   . calcium carbonate (TUMS - DOSED IN MG ELEMENTAL CALCIUM) 500 MG chewable tablet Chew 2 tablets by mouth as needed for indigestion or heartburn.   . canagliflozin (INVOKANA) 300 MG TABS tablet Take 300 mg by mouth daily before breakfast.  . Cholecalciferol (VITAMIN D3 SUPER STRENGTH) 50 MCG (2000 UT) CAPS Take 2,000 Units by mouth daily.  . clopidogrel (PLAVIX) 75 MG tablet TAKE ONE TABLET BY MOUTH ONE TIME DAILY with breakfast  . docusate sodium (COLACE) 100 MG capsule Take 100 mg by mouth at bedtime.  [name] FREESTYLE TEST STRIPS test strip 1 each by Other route every morning.   . gabapentin (NEURONTIN) 300 MG capsule Take 300 mg by mouth at bedtime.   . hydrochlorothiazide  (HYDRODIURIL) 25 MG tablet Take 25 mg by mouth daily.  . insulin degludec (TRESIBA FLEXTOUCH) 200 UNIT/ML FlexTouch Pen Inject 18 Units into the skin daily before breakfast.   . irbesartan (AVAPRO) 300 MG tablet Take 300 mg by mouth at bedtime.   . isosorbide mononitrate (IMDUR) 30 MG 24 hr tablet Take 1 tablet (30 mg total) by mouth daily.  . metFORMIN (GLUCOPHAGE-XR) 500 MG 24 hr tablet Take 1 tablet (500 mg total) by mouth 2 (two) times daily.  . Multiple Vitamin (MULTIVITAMIN) tablet Take 1 tablet by mouth daily.  . mupirocin ointment (BACTROBAN) 2 % SMARTSIG:1 Application Topical 2-3 Times Daily  . nitroGLYCERIN (NITROSTAT) 0.4 MG SL tablet DISSOLVE 1 TABLET UNDER TONGUE AS NEEDEDFOR CHEST PAIN. MAY REPEAT 5 MINUTES APART 3 TIMES IF NEEDED  . NONFORMULARY OR COMPOUNDED ITEM Apply 1 application topically See admin instructions. Antifungal solution: Terbinafine 3%, Fluconazole 2%, Tea Tree Oil 5%, Urea 10%, Ibuprofen 2% in DMSO suspension: Apply to the toenails daily as directed  . pantoprazole (PROTONIX) 40 MG tablet Take 1 tablet (40 mg total) by mouth 2 (two) times daily before a meal.  . polyethylene glycol (MIRALAX / GLYCOLAX) packet Take 17 g by mouth daily as needed for mild constipation (MIX AND DRINK).   . promethazine (PHENERGAN) 25 MG tablet Take 12.5 mg by mouth every 6 (six) hours as needed for nausea or vomiting.   . ranolazine (RANEXA) 500 MG 12 hr tablet Take 1 tablet (500 mg total) by mouth 2 (two) times daily.     Allergies: Allergies  Allergen Reactions  . Oxycodone Nausea Only    Social History: The patient  reports that he has never smoked. He has never used smokeless tobacco. He reports that he does not drink alcohol and does not use drugs.   Family History: The patient's family history includes Coronary artery disease in his cousin; Diabetes type II in his mother; Heart attack in his father; Heart disease in his brother.   Review of Systems: Please see the  history of present illness.   All other systems are reviewed and negative.   Physical Exam: VS:  BP 100/60 (BP Location: Left Arm, Patient Position: Sitting, Cuff Size: Normal)   Pulse 60   Ht 5\' 9"  (1.753 m)  Wt 194 lb (88 kg)   SpO2 98%   BMI 28.65 kg/m  .  BMI Body mass index is 28.65 kg/m.  Wt Readings from Last 3 Encounters:  01/27/20 194 lb (88 kg)  08/16/19 191 lb 3.2 oz (86.7 kg)  07/26/19 189 lb 14.4 oz (86.1 kg)    General: Pleasant. Well developed, well nourished and in no acute distress.   HEENT: Normal.  Neck: Supple, no JVD, carotid bruits, or masses noted.  Cardiac: Regular rate and rhythm. No murmurs, rubs, or gallops. No edema.  Respiratory:  Lungs are clear to auscultation bilaterally with normal work of breathing.  GI: Soft and nontender.  MS: No deformity or atrophy. Gait and ROM intact.  Skin: Warm and dry. Color is normal.  Neuro:  Strength and sensation are intact and no gross focal deficits noted.  Psych: Alert, appropriate and with normal affect.   LABORATORY DATA:  EKG:  EKG is not ordered today.    Lab Results  Component Value Date   WBC 4.0 07/26/2019   HGB 14.2 07/26/2019   HCT 42.4 07/26/2019   PLT 127 (L) 07/26/2019   GLUCOSE 148 (H) 07/26/2019   CHOL 96 07/26/2019   TRIG 70 07/26/2019   HDL 35 (L) 07/26/2019   LDLCALC 47 07/26/2019   ALT 35 07/25/2019   AST 23 07/25/2019   NA 139 07/26/2019   K 4.1 07/26/2019   CL 104 07/26/2019   CREATININE 0.98 07/26/2019   BUN 23 07/26/2019   CO2 29 07/26/2019   TSH 1.532 07/25/2019   INR 1.0 07/25/2019   HGBA1C 7.3 (H) 07/26/2019       BNP (last 3 results) No results for input(s): BNP in the last 8760 hours.  ProBNP (last 3 results) No results for input(s): PROBNP in the last 8760 hours.   Other Studies Reviewed Today:   LEFT HEART CATH AND CORS/GRAFTS ANGIOGRAPHY 6/02021  Conclusion    LIMA graft was visualized by angiography and is normal in caliber.  The graft  exhibits no disease.  Prox RCA lesion is 50% stenosed.  Mid RCA lesion is 85% stenosed.  The left ventricular systolic function is normal.  LV end diastolic pressure is mildly elevated.  The left ventricular ejection fraction is 55-65% by visual estimate.  SVG graft was visualized by angiography and is normal in caliber.  The graft exhibits no disease.  2nd Diag lesion is 90% stenosed.  1st Mrg lesion is 80% stenosed.  SVG graft was visualized by angiography and is normal in caliber.  The graft exhibits no disease.  Prox LAD to Mid LAD lesion is 30% stenosed.  Ost LM lesion is 60% stenosed.  Prox Cx to Mid Cx lesion is 80% stenosed.  SVG graft was visualized by angiography.  Prox Graft lesion is 40% stenosed.   1.  Significant underlying three-vessel coronary artery disease with patent grafts including LIMA to LAD, SVG to second diagonal, SVG to OM and SVG to right PL/PDA.  There is progression of native coronary artery disease including the RCA and also ostial left main.  However, the LIMA is no longer atretic. 2.  Normal LV systolic function mildly elevated left ventricular end-diastolic pressure.  Recommendations: No clear explanation of why the patient's anginal symptoms have worsened.  Recommend continuing aggressive medical therapy.  Treating the ostial left main with PCI might improve the flow to the distal left circumflex.  However, risks probably outweigh the benefit given that the distal distribution is small and  does get some retrograde flow from SVG to OM.   SURGICAL PROCEDURE9/2018:  Coronary artery bypass grafting x5 with the left internal mammary to the distal left anterior descending coronary artery, reverse saphenous vein graft to the diagonal, reverse saphenous vein graft to the first obtuse marginal, sequential reverse saphenous vein graft to the proximal posterior lateral branch and to the distal posterior descending with right leg, thigh, and  calf greater saphenous vein harvesting endoscopically.    ASSESSMENT & PLAN:   1. CAD - prior DES x 3 to the LAD, prior DES x 1 to pPLA and balloon PCI to pPDA with susequent CABG x 5 with LIMA to dLAD, SVG to DX, SGT to 1st OM and sequential SVG to proximal PL and distal PD from 10/2016.   He has had repeat cath - last in June - reassuring. Doing well with no complaints. Continue with current regimen and CV risk factor modification.   2. HTN - BP looks good - no changes made today.   3. HLD - on statin - lab from August noted.   4. DM - per PCP.   Current medicines are reviewed with the patient today.  The patient does not have concerns regarding medicines other than what has been noted above.  The following changes have been made:  See above.  Labs/ tests ordered today include:   No orders of the defined types were placed in this encounter.    Disposition:   FU with Dr. Acie Fredrickson in 6 months - they are aware that I am leaving in February. Overall, he is doing well. No changes made today.    Patient is agreeable to this plan and will call if any problems develop in the interim.   SignedTruitt Merle, NP  01/27/2020 11:35 AM  [facility] [address] [place], [place]  [address] Phone: [phone] Fax: [phone]

## 2020-01-27 ENCOUNTER — Encounter: Payer: Self-pay | Admitting: Nurse Practitioner

## 2020-01-27 ENCOUNTER — Other Ambulatory Visit: Payer: Self-pay

## 2020-01-27 ENCOUNTER — Ambulatory Visit: Payer: PPO | Admitting: Nurse Practitioner

## 2020-01-27 VITALS — BP 100/60 | HR 60 | Ht 69.0 in | Wt 194.0 lb

## 2020-01-27 DIAGNOSIS — E785 Hyperlipidemia, unspecified: Secondary | ICD-10-CM | POA: Diagnosis not present

## 2020-01-27 DIAGNOSIS — I10 Essential (primary) hypertension: Secondary | ICD-10-CM

## 2020-01-27 DIAGNOSIS — Z951 Presence of aortocoronary bypass graft: Secondary | ICD-10-CM | POA: Diagnosis not present

## 2020-01-27 DIAGNOSIS — I2510 Atherosclerotic heart disease of native coronary artery without angina pectoris: Secondary | ICD-10-CM

## 2020-01-27 NOTE — Patient Instructions (Addendum)
After Visit Summary:  We will be checking the following labs today - NONE   Medication Instructions:    Continue with your current medicines.    If you need a refill on your cardiac medications before your next appointment, please call your pharmacy.     Testing/Procedures To Be Arranged:  N/A  Follow-Up:   See Dr. Acie Fredrickson in 6 months.     At [facility], you and your health needs are our priority.  As part of our continuing mission to provide you with exceptional heart care, we have created designated Provider Care Teams.  These Care Teams include your primary Cardiologist (physician) and Advanced Practice Providers (APPs -  Physician Assistants and Nurse Practitioners) who all work together to provide you with the care you need, when you need it.  Special Instructions:  . Stay safe, wash your hands for at least 20 seconds and wear a mask when needed.  . It was good to talk with you today.    Call the [place] office at [phone] if you have any questions, problems or concerns.

## 2020-02-17 ENCOUNTER — Other Ambulatory Visit: Payer: Self-pay | Admitting: Nurse Practitioner

## 2020-02-17 DIAGNOSIS — I259 Chronic ischemic heart disease, unspecified: Secondary | ICD-10-CM

## 2020-02-19 ENCOUNTER — Other Ambulatory Visit: Payer: PPO | Admitting: *Deleted

## 2020-02-19 ENCOUNTER — Other Ambulatory Visit: Payer: Self-pay

## 2020-02-19 DIAGNOSIS — I2510 Atherosclerotic heart disease of native coronary artery without angina pectoris: Secondary | ICD-10-CM | POA: Diagnosis not present

## 2020-02-19 DIAGNOSIS — I10 Essential (primary) hypertension: Secondary | ICD-10-CM

## 2020-02-19 DIAGNOSIS — Z951 Presence of aortocoronary bypass graft: Secondary | ICD-10-CM | POA: Diagnosis not present

## 2020-02-19 DIAGNOSIS — E785 Hyperlipidemia, unspecified: Secondary | ICD-10-CM | POA: Diagnosis not present

## 2020-02-21 LAB — BASIC METABOLIC PANEL
BUN/Creatinine Ratio: 17 (ref 10–24)
BUN: 23 mg/dL (ref 8–27)
CO2: 18 mmol/L — ABNORMAL LOW (ref 20–29)
Calcium: 10 mg/dL (ref 8.6–10.2)
Chloride: 111 mmol/L — ABNORMAL HIGH (ref 96–106)
Creatinine, Ser: 1.38 mg/dL — ABNORMAL HIGH (ref 0.76–1.27)
GFR calc Af Amer: 58 mL/min/{1.73_m2} — ABNORMAL LOW (ref >59)
GFR calc non Af Amer: 50 mL/min/{1.73_m2} — ABNORMAL LOW (ref >59)
Glucose: 109 mg/dL — ABNORMAL HIGH (ref 65–99)
Potassium: 4.4 mmol/L (ref 3.5–5.2)
Sodium: 154 mmol/L — ABNORMAL HIGH (ref 134–144)

## 2020-02-21 LAB — HEPATIC FUNCTION PANEL
ALT: 24 IU/L (ref 0–44)
AST: 17 IU/L (ref 0–40)
Albumin: 4.6 g/dL (ref 3.7–4.7)
Alkaline Phosphatase: 88 IU/L (ref 44–121)
Bilirubin Total: 0.5 mg/dL (ref 0.0–1.2)
Bilirubin, Direct: <0.1 mg/dL (ref 0.00–0.40)
Total Protein: 7.2 g/dL (ref 6.0–8.5)

## 2020-02-21 LAB — LIPID PANEL
Chol/HDL Ratio: 2.9 ratio (ref 0.0–5.0)
Cholesterol, Total: 105 mg/dL (ref 100–199)
HDL: 36 mg/dL — ABNORMAL LOW (ref >39)
LDL Chol Calc (NIH): 53 mg/dL (ref 0–99)
Triglycerides: 78 mg/dL (ref 0–149)
VLDL Cholesterol Cal: 16 mg/dL (ref 5–40)

## 2020-02-24 ENCOUNTER — Telehealth: Payer: Self-pay

## 2020-02-24 DIAGNOSIS — E870 Hyperosmolality and hypernatremia: Secondary | ICD-10-CM

## 2020-02-24 DIAGNOSIS — R7989 Other specified abnormal findings of blood chemistry: Secondary | ICD-10-CM

## 2020-02-24 NOTE — Telephone Encounter (Signed)
-----   Message from Thayer Headings, MD sent at 02/21/2020  5:39 PM EST ----- Lipids look good Creatinine lis slightly higher  Sodium and chloride are elevated . This suggests that he was volume depleted at the lab draw. Lets repeat this is 2 weeks.   Have him increase his water intake .

## 2020-02-24 NOTE — Telephone Encounter (Signed)
LVM for return call to review lab results and recommendations.

## 2020-02-26 NOTE — Telephone Encounter (Signed)
Patient returning call.

## 2020-02-26 NOTE — Telephone Encounter (Signed)
Called patient back with Dr. Elmarie Shiley advisement. Patient aware of results. Patient will increase his water intake. Patient will come back on 03/11/20 for repeat BMET.

## 2020-02-28 ENCOUNTER — Ambulatory Visit: Payer: PPO | Admitting: Podiatry

## 2020-02-28 DIAGNOSIS — E1169 Type 2 diabetes mellitus with other specified complication: Secondary | ICD-10-CM | POA: Diagnosis not present

## 2020-02-28 DIAGNOSIS — G4733 Obstructive sleep apnea (adult) (pediatric): Secondary | ICD-10-CM | POA: Diagnosis not present

## 2020-02-28 DIAGNOSIS — E870 Hyperosmolality and hypernatremia: Secondary | ICD-10-CM | POA: Diagnosis not present

## 2020-02-28 DIAGNOSIS — E785 Hyperlipidemia, unspecified: Secondary | ICD-10-CM | POA: Diagnosis not present

## 2020-02-28 DIAGNOSIS — I2510 Atherosclerotic heart disease of native coronary artery without angina pectoris: Secondary | ICD-10-CM | POA: Diagnosis not present

## 2020-02-28 DIAGNOSIS — I10 Essential (primary) hypertension: Secondary | ICD-10-CM | POA: Diagnosis not present

## 2020-02-28 DIAGNOSIS — I209 Angina pectoris, unspecified: Secondary | ICD-10-CM | POA: Diagnosis not present

## 2020-03-06 ENCOUNTER — Ambulatory Visit: Payer: PPO | Admitting: Podiatry

## 2020-03-06 ENCOUNTER — Telehealth: Payer: Self-pay | Admitting: Cardiovascular Disease

## 2020-03-06 ENCOUNTER — Encounter: Payer: Self-pay | Admitting: Podiatry

## 2020-03-06 ENCOUNTER — Other Ambulatory Visit: Payer: Self-pay

## 2020-03-06 DIAGNOSIS — R142 Eructation: Secondary | ICD-10-CM | POA: Insufficient documentation

## 2020-03-06 DIAGNOSIS — B351 Tinea unguium: Secondary | ICD-10-CM

## 2020-03-06 DIAGNOSIS — M79676 Pain in unspecified toe(s): Secondary | ICD-10-CM | POA: Diagnosis not present

## 2020-03-06 DIAGNOSIS — R141 Gas pain: Secondary | ICD-10-CM | POA: Insufficient documentation

## 2020-03-06 DIAGNOSIS — E1142 Type 2 diabetes mellitus with diabetic polyneuropathy: Secondary | ICD-10-CM

## 2020-03-06 NOTE — Telephone Encounter (Signed)
I have not seen Hunter Mcbride in a [name] time.   We can discuss whether or not we need to repeat the labs at the office visit . There is nothing so abnormal on the labs sent over to make me worry that we need to have this repeated.

## 2020-03-06 NOTE — Telephone Encounter (Signed)
Returned call.  Advised upcoming labs have been cancelled.

## 2020-03-06 NOTE — Telephone Encounter (Signed)
Patients wife wanted to know if patient should still keep lab work scheduled for Dr. Acie Fredrickson. Please call back

## 2020-03-06 NOTE — Progress Notes (Signed)
Subjective: Hunter Mcbride. presents today for follow up of preventative diabetic foot care and painful mycotic nails b/l that are difficult to trim. Pain interferes with ambulation. Aggravating factors include wearing enclosed shoe gear. Pain is relieved with periodic professional debridement.   He states his blood glucose was 84 mg/dl this morning. He states he has been applying the compounded antifungal solution to his toenails most days. He voices no new pedal concerns on today's visit.  Allergies  Allergen Reactions  . Methocarbamol     Other reaction(s): nauseated  . Oxycodone Nausea Only    Objective: There were no vitals filed for this visit.  Hunter Mcbride is a pleasant  75 y.o. year old Caucasian male, WD, WN in NAD. AAO x 3.   Vascular Examination:  Capillary refill time to digits immediate b/l. Palpable DP pulses b/l. Palpable PT pulses b/l. Pedal hair present b/l. Skin temperature gradient within normal limits b/l. No edema noted b/l.  Dermatological Examination: Pedal skin with normal turgor, texture and tone bilaterally. No open wounds bilaterally. No interdigital macerations bilaterally. Toenails 1-5 b/l elongated, dystrophic, thickened, crumbly with subungual debris and tenderness to dorsal palpation.  Musculoskeletal: Normal muscle strength 5/5 to all lower extremity muscle groups bilaterally. No pain crepitus or joint limitation noted with ROM b/l. Hammertoes noted to the 2-5 bilaterally. Patient ambulates independent of any assistive aids.  Neurological: Protective sensation decreased with 10 gram monofilament b/l. Proprioception intact bilaterally. Babinski reflex negative b/l. Clonus negative b/l.  Assessment: 1. Pain due to onychomycosis of toenail   2. Diabetic peripheral neuropathy associated with type 2 diabetes mellitus ([place])    Plan: -Examined patient. -No new findings. No new orders. -Continue diabetic foot care principles. -Toenails 1-5 b/l were debrided  in length and girth with sterile nail nippers and dremel without iatrogenic bleeding.  -Patient to report any pedal injuries to medical professional immediately. -Patient to continue soft, supportive shoe gear daily. -Patient/POA to call should there be question/concern in the interim.  Return in about 3 months (around 06/04/2020).

## 2020-03-06 NOTE — Telephone Encounter (Signed)
Spoke with pt's wife, DPR who states pt was seen by his PCP 02/28/2020 and CMET was completed at that visit.  Pt is scheduled for a repeat BMET on 03/11/2020 per Dr Acie Fredrickson.  Pt's wife is asking if pt needs the lab repeated again.    COMPLETE METABOLIC  [id number]   GLUCOSE 183  60 - 110  BUN 20  5 - 23  CREATININE 1.2  0.3 - 1.5  eGFR Non-African American 59.3  <  eGFR African American 71.8  <  SODIUM 140  135 - 148  POTASSIUM 4.7  3.5 - 5.3  CHLORIDE 102  80 - 111  CO2 24  15 - 35  CALCIUM 9.6  7.0 - 10.5  TOTAL PROTEIN 7.1  6.0 - 8.5  ALBUMIN 4.3  2.0 - 5.5  AST 22  7 - 45  ALT 29  5 - 40  ALK PHOS 82  37 - 137  TOTAL BILIRUBIN 1.2  0.0 - 1.5

## 2020-03-11 ENCOUNTER — Other Ambulatory Visit: Payer: PPO

## 2020-03-19 DIAGNOSIS — H40013 Open angle with borderline findings, low risk, bilateral: Secondary | ICD-10-CM | POA: Diagnosis not present

## 2020-03-19 DIAGNOSIS — H2513 Age-related nuclear cataract, bilateral: Secondary | ICD-10-CM | POA: Diagnosis not present

## 2020-03-19 DIAGNOSIS — H25013 Cortical age-related cataract, bilateral: Secondary | ICD-10-CM | POA: Diagnosis not present

## 2020-03-19 DIAGNOSIS — E119 Type 2 diabetes mellitus without complications: Secondary | ICD-10-CM | POA: Diagnosis not present

## 2020-06-15 DIAGNOSIS — M19012 Primary osteoarthritis, left shoulder: Secondary | ICD-10-CM | POA: Diagnosis not present

## 2020-06-16 ENCOUNTER — Encounter: Payer: Self-pay | Admitting: Podiatry

## 2020-06-16 ENCOUNTER — Ambulatory Visit: Payer: PPO | Admitting: Podiatry

## 2020-06-16 ENCOUNTER — Other Ambulatory Visit: Payer: Self-pay

## 2020-06-16 DIAGNOSIS — M79676 Pain in unspecified toe(s): Secondary | ICD-10-CM | POA: Diagnosis not present

## 2020-06-16 DIAGNOSIS — E1142 Type 2 diabetes mellitus with diabetic polyneuropathy: Secondary | ICD-10-CM

## 2020-06-16 DIAGNOSIS — B351 Tinea unguium: Secondary | ICD-10-CM

## 2020-06-17 ENCOUNTER — Other Ambulatory Visit: Payer: Self-pay

## 2020-06-17 MED ORDER — AMLODIPINE BESYLATE 5 MG PO TABS: 1.0000 | ORAL_TABLET | Freq: Two times a day (BID) | ORAL | 2 refills | Status: DC

## 2020-06-21 NOTE — Progress Notes (Signed)
Subjective: Hunter Mcbride. is a pleasant 75 y.o. male patient seen today painful thick toenails that are difficult to trim. Pain interferes with ambulation. Aggravating factors include wearing enclosed shoe gear. Pain is relieved with periodic professional debridement.  He states he is no longer using the nail solution from [place].  He has purchased an OTC topical medication, but does not know the name of it. He will bring it in on his next visit.  His blood glucose was 186 mg/dl this morning. He has had a cortisone injection recently.  Allergies  Allergen Reactions  . Hydrocodone     Other reaction(s): nauseated  . Methocarbamol     Other reaction(s): nauseated  . Oxycodone Nausea Only    Objective: Physical Exam  General: Hunter Mcbride. is a pleasant 75 y.o. Caucasian male, in NAD. AAO x 3.   Vascular:  Capillary refill time to digits immediate b/l. Palpable pedal pulses b/l LE. Pedal hair present b/l lower extremities. Lower extremity skin temperature gradient within normal limits. No pain with calf compression b/l. No edema noted b/l lower extremities.   Dermatological:  Pedal skin with normal turgor, texture and tone bilaterally. No open wounds bilaterally. No interdigital macerations bilaterally. Toenails 1-5 b/l elongated, discolored, dystrophic, thickened, crumbly with subungual debris and tenderness to dorsal palpation. No hyperkeratotic nor porokeratotic lesions present on today's visit.  Musculoskeletal:  Normal muscle strength 5/5 to all lower extremity muscle groups bilaterally. No pain crepitus or joint limitation noted with ROM b/l. Hammertoes noted to the 2-5 bilaterally.  Neurological:  Protective sensation decreased bilaterally with 10g monofilament b/l. Vibratory sensation intact b/l. Clonus negative b/l.  Assessment and Plan:  1. Pain due to onychomycosis of toenail   2. Diabetic peripheral neuropathy associated with type 2 diabetes mellitus  ([place])     -Examined patient. -Continue diabetic foot care principles. -Patient to continue soft, supportive shoe gear daily. -Toenails 1-5 b/l were debrided in length and girth with sterile nail nippers and dremel without iatrogenic bleeding.  -Patient to report any pedal injuries to medical professional immediately. -Patient/POA to call should there be question/concern in the interim.  Return in about 3 months (around 09/16/2020).  Marzetta Board, DPM

## 2020-07-01 DIAGNOSIS — I10 Essential (primary) hypertension: Secondary | ICD-10-CM | POA: Diagnosis not present

## 2020-07-01 DIAGNOSIS — I209 Angina pectoris, unspecified: Secondary | ICD-10-CM | POA: Diagnosis not present

## 2020-07-01 DIAGNOSIS — E1169 Type 2 diabetes mellitus with other specified complication: Secondary | ICD-10-CM | POA: Diagnosis not present

## 2020-07-01 DIAGNOSIS — E785 Hyperlipidemia, unspecified: Secondary | ICD-10-CM | POA: Diagnosis not present

## 2020-07-01 DIAGNOSIS — I2510 Atherosclerotic heart disease of native coronary artery without angina pectoris: Secondary | ICD-10-CM | POA: Diagnosis not present

## 2020-07-01 DIAGNOSIS — E870 Hyperosmolality and hypernatremia: Secondary | ICD-10-CM | POA: Diagnosis not present

## 2020-07-01 DIAGNOSIS — G4733 Obstructive sleep apnea (adult) (pediatric): Secondary | ICD-10-CM | POA: Diagnosis not present

## 2020-07-26 ENCOUNTER — Encounter: Payer: Self-pay | Admitting: Cardiovascular Disease

## 2020-07-26 NOTE — Progress Notes (Signed)
Hunter Mcbride. Date of Birth  Jan 29, 1946 [place]  [address]    [place]   [place], [place]  [address]    [place], [place]  [address] [phone]  Fax  [phone]  [phone]  Fax [phone]  Problem list: 1. History of chest pain-negative stress Myoview study in April, 2010 2. Hyperlipidemia 3. Hypertension 4. Diabetes mellitus 5. Sleep apnea   Hunter Mcbride is doing well.  His quite active. 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 He's not had any episodes of chest pain, shortness breath, syncope, or presyncope.  06/05/2013:    Hunter Mcbride is doing well.  He has been [place] hunting this am.   No CP or dyspnea.  He has noticed that he has some mild vague chest and arm ache - especially with walking.  He can usually walk through the discomfort and the pain will eventually resolve.  He noticed this discomfort this am while hunting.   He has also noticed this while push mowing.   These have been going on for about 1 year.    He had a myoview in 2012 which was negative.    12/04/2015: Hunter Mcbride is doing well. He he has seen Hunter Mcbride for the past 2-1/2 years. Is seen with wife, Hunter Mcbride,   Had a cath in June, 2017.   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   Went to the ER in Sept with angina . Thought it may be indigestion.   Was taking NTG .     Imdur was increased . Seems to be improved at this point  Has some orthostatic hypotension because of the increased Imdur dosing .  Is on Ranexa 500 BID   July 25, 2019: Hunter Mcbride is seen back today for evaluation of chest pain.  He has a history of coronary artery disease. Presents with 2 weeks of episodic CP Usually with exertion . Has started radiating to left arm.  Dull ache.  Has significant intrascapular pain .   Dull ache with hot sensation  Took 4 SL NTG on Friday ,  Would ease up for several minutes.  Woke up  frequently Friday night with recurrent cp Had chest soreness since then  Has continued to have these CP  Associated with nausea,   Is very fatigued.  He sat around yesterday ,  Pain eased off.  Last night took more SL nitro, tylenol  August 16, 2019:  Hunter Mcbride is seen today for follow-up visit.  He presented to me last month with episodes of chest discomfort.  We referred him for heart catheterization.  Heart catheterization reveals three-vessel native coronary artery disease with patent grafts including LIMA to LAD, SVG to D2, SVG to OM and SVG to RCA/PDA.  There is been some progression of native coronary artery disease including the RCA but also the ostial left main.  The LIMA is no longer atretic.  No specific reason for the patient's increased anginal symptoms was found.  Medical therapy was continued.  Seems to be doing much better at this point  No further episodes of angina at this point .  Is doing much better in Imdur 30 mg ( which he takes at night )    July 27, 2020: Hunter Mcbride is seen today for follow up of his CAD/ CABG. No angina Able to get out and hunt this past spring  No significant angina  Dr. Joylene Draft has encouraged him to participate in a study about inflammation    Current Outpatient Medications on File Prior to Visit  Medication Sig Dispense Refill   acetaminophen (TYLENOL) 325 MG tablet Take 325 mg by mouth as needed.     ALPRAZolam (XANAX) 0.5 MG tablet TAKE ONE TABLET BY MOUTH UP TO TWICE A DAY AS NEEDED FOR ANXIETY     amLODipine (NORVASC) 5 MG tablet Take 1 tablet (5 mg total) by mouth 2 (two) times daily. 180 tablet 2   aspirin EC 81 MG tablet Take 81 mg by mouth at bedtime.      atenolol (TENORMIN) 50 MG tablet Take 1 tablet (50 mg total) by mouth at bedtime. 90 tablet 3   atorvastatin (LIPITOR) 80 MG tablet Take 1 tablet by mouth daily.     Bioflavonoid Products (SUPER C-500) TABS Take 1 tablet by mouth daily.     calcium carbonate (TUMS - DOSED IN MG ELEMENTAL  CALCIUM) 500 MG chewable tablet Chew 2 tablets by mouth as needed for indigestion or heartburn.      canagliflozin (INVOKANA) 300 MG TABS tablet Take 1 tablet by mouth daily.     clopidogrel (PLAVIX) 75 MG tablet Take by mouth daily.     docusate sodium (COLACE) 100 MG capsule Take 100 mg by mouth at bedtime.     famotidine (PEPCID) 20 MG tablet Take 20 mg by mouth 2 (two) times daily.     FREESTYLE TEST STRIPS test strip 1 each by Other route every morning.      gabapentin (NEURONTIN) 300 MG capsule Take 1 capsule by mouth 3 (three) times daily.     hydrochlorothiazide (HYDRODIURIL) 25 MG tablet Take 1 tablet by mouth every morning.     irbesartan (AVAPRO) 300 MG tablet Take 300 mg by mouth at bedtime.      isosorbide mononitrate (IMDUR) 30 MG 24 hr tablet Take 1 tablet by mouth daily.     metFORMIN (GLUCOPHAGE-XR) 500 MG 24 hr tablet Take 1 tablet (500 mg total) by mouth 2 (two) times daily.     mupirocin ointment (BACTROBAN) 2 % Apply to affected area three times a day as needed     Naftifine HCl 2 % CREA APPLY TO AFFECTED AREA(S) TWICE A DAY FOR YEAST     nitroGLYCERIN (NITROSTAT) 0.4 MG SL tablet DISSOLVE 1 TABLET UNDER TONGUE AS NEEDEDFOR CHEST PAIN. MAY REPEAT 5 MINUTES APART 3 TIMES IF NEEDED 25 tablet 3   NONFORMULARY OR COMPOUNDED ITEM Apply 1 application topically See admin instructions. Antifungal solution: Terbinafine 3%, Fluconazole 2%, Tea Tree Oil 5%, Urea 10%, Ibuprofen 2% in DMSO suspension: Apply to the toenails daily as directed     pantoprazole (PROTONIX) 40 MG tablet Take 1 tablet by mouth 2 (two) times daily.     polyethylene glycol (MIRALAX / GLYCOLAX) 17 g packet 1 packet mixed with 8 ounces of fluid     promethazine (PHENERGAN) 12.5 MG tablet 1 tablet as needed     ranolazine (RANEXA) 500 MG 12 hr tablet 1 tablet     TRESIBA FLEXTOUCH 100 UNIT/ML FlexTouch Pen Inject 18 Units into the skin daily.     Vitamin D, Cholecalciferol, 25 MCG (1000 UT) CAPS Take 1 capsule by  mouth daily.     No current facility-administered medications on file prior to visit.    Allergies  Allergen Reactions   Hydrocodone     Other reaction(s): nauseated   Methocarbamol  Other reaction(s): nauseated   Oxycodone Nausea Only    Past Medical History:  Diagnosis Date   Arthritis    "lower back; right shoulder; left thumb; joints" (06/21/2013)   Asthma    "not sure if this is true or not" (06/21/2013)   CAD (coronary artery disease) 06/2013   s/p PTCA/DES x 3 to mid LAD 2015. b. s/p CABG in 2018. c. cath 12/2017 -> med rx.   GERD (gastroesophageal reflux disease)    Gout    "maybe twice in my life"   Headache(784.0)    "weekly for the last 3-4 months" (06/21/2013)   Hyperlipidemia    Hypertension    Myocardial infarction ([place])    "/Dr. Katharina Caper I've had one between 2014-2015) (06/21/2013)   Osteoarthritis    Sleep apnea    WEIGHT LOSS, NO LONGER NEEDS PER PATIENT   Type II diabetes mellitus ([place])    TYPE 2    Past Surgical History:  Procedure Laterality Date   CARDIAC CATHETERIZATION  1980's   "once"   CARDIAC CATHETERIZATION N/A 07/23/2015   Procedure: Left Heart Cath and Coronary Angiography;  Surgeon: Sherren Mocha, MD;  Location: [facility];  Service: Cardiovascular;  Laterality: N/A;   CARDIOVASCULAR STRESS TEST  06/10/2008   EF 68%   CARPAL TUNNEL RELEASE Bilateral    CORONARY ANGIOPLASTY WITH STENT PLACEMENT  06/21/2013   "3"   CORONARY ARTERY BYPASS GRAFT N/A 11/07/2016   Procedure: CORONARY ARTERY BYPASS GRAFTING (CABG) x 5 (LIMA to DISTAL LAD, SVG to DIAGONAL, SVG to CIRCUMFLEX, and SVG SEQUENTIALLY to PLB and DISTAL PDA) with EVH of the RIGHT GREATER SAPHENOUS VEIN and LEFT INTERNAL MAMMARY ARTERY HARVEST;  Surgeon: Grace Isaac, MD;  Location: [facility];  Service: Open Heart Surgery;  Laterality: N/A;   CORONARY STENT INTERVENTION N/A 06/07/2016   Procedure: Coronary Stent Intervention;  Surgeon: Burnell Blanks, MD;  Location: [facility];  Service: Cardiovascular;  Laterality: N/A;   EXCISION MORTON'S NEUROMA Left    EYE SURGERY Left    "removed film over my eye"   FRACTIONAL FLOW RESERVE WIRE N/A 06/26/2013   Procedure: [place];  Surgeon: [name], MD;  Location: [facility];  Service: Cardiovascular;  Laterality: N/A;   HERNIA REPAIR Left    INTRAVASCULAR PRESSURE WIRE/FFR STUDY N/A 10/28/2016   Procedure: INTRAVASCULAR PRESSURE WIRE/FFR STUDY;  Surgeon: Leonie Man, MD;  Location: [facility];  Service: Cardiovascular;  Laterality: N/A;   JOINT REPLACEMENT Left 03/2013   "thumb"   LEFT HEART CATH AND CORONARY ANGIOGRAPHY N/A 06/07/2016   Procedure: Left Heart Cath and Coronary Angiography;  Surgeon: Burnell Blanks, MD;  Location: [facility];  Service: Cardiovascular;  Laterality: N/A;   LEFT HEART CATH AND CORONARY ANGIOGRAPHY N/A 10/28/2016   Procedure: LEFT HEART CATH AND CORONARY ANGIOGRAPHY;  Surgeon: Leonie Man, MD;  Location: [facility];  Service: Cardiovascular;  Laterality: N/A;   LEFT HEART CATH AND CORS/GRAFTS ANGIOGRAPHY N/A 12/25/2017   Procedure: LEFT HEART CATH AND CORS/GRAFTS ANGIOGRAPHY;  Surgeon: Sherren Mocha, MD;  Location: [facility];  Service: Cardiovascular;  Laterality: N/A;   LEFT HEART CATH AND CORS/GRAFTS ANGIOGRAPHY N/A 07/26/2019   Procedure: LEFT HEART CATH AND CORS/GRAFTS ANGIOGRAPHY;  Surgeon: [name], MD;  Location: [facility];  Service: Cardiovascular;  Laterality: N/A;   LEFT HEART CATHETERIZATION WITH CORONARY ANGIOGRAM N/A 06/21/2013   Procedure:  LEFT HEART CATHETERIZATION WITH CORONARY ANGIOGRAM;  Surgeon: Burnell Blanks, MD;  Location: [facility];  Service: Cardiovascular;  Laterality: N/A;   LEFT HEART CATHETERIZATION WITH CORONARY ANGIOGRAM N/A 06/26/2013   Procedure: LEFT HEART CATHETERIZATION WITH CORONARY ANGIOGRAM;  Surgeon: [name], MD;  Location: [facility];   Service: Cardiovascular;  Laterality: N/A;   LUMBAR LAMINECTOMY/DECOMPRESSION MICRODISCECTOMY Left 05/30/2018   Procedure: Left  [place]ill L5 decompression;  Surgeon: Melina Schools, MD;  Location: [facility];  Service: Orthopedics;  Laterality: Left;  110min   TEE WITHOUT CARDIOVERSION N/A 11/07/2016   Procedure: TRANSESOPHAGEAL ECHOCARDIOGRAM (TEE);  Surgeon: Grace Isaac, MD;  Location: [facility];  Service: Open Heart Surgery;  Laterality: N/A;   TONSILLECTOMY     TOTAL HIP ARTHROPLASTY Right 04/12/2016   Procedure: RIGHT TOTAL HIP ARTHROPLASTY ANTERIOR APPROACH;  Surgeon: Paralee Cancel, MD;  Location: [facility];  Service: Orthopedics;  Laterality: Right;  requests 70 mins   TOTAL SHOULDER ARTHROPLASTY  03/01/2012   Procedure: TOTAL SHOULDER ARTHROPLASTY;  Surgeon: Marin Shutter, MD;  Location: [facility];  Service: Orthopedics;  Laterality: Right;   UMBILICAL HERNIA REPAIR      Social History   Tobacco Use  Smoking Status Never  Smokeless Tobacco Never    Social History   Substance and Sexual Activity  Alcohol Use No    Family History  Problem Relation Age of Onset   Diabetes type II Mother    Heart attack Father    Coronary artery disease Cousin    Heart disease Brother     Reviw of Systems:  Reviewed in the HPI.  All other systems are negative.  Physical Exam: Blood pressure 108/70, pulse (!) 58, height 5\' 9"  (1.753 m), weight 198 lb 3.2 oz (89.9 kg), SpO2 97 %.  GEN:  Well nourished, well developed in no acute distress HEENT: Normal NECK: No JVD; No carotid bruits LYMPHATICS: No lymphadenopathy CARDIAC: RRR , no murmurs, rubs, gallops RESPIRATORY:  Clear to auscultation without rales, wheezing or rhonchi  ABDOMEN: Soft, non-tender, non-distended MUSCULOSKELETAL:  No edema; No deformity  SKIN: Warm and dry NEUROLOGIC:  Alert and oriented x 3    ECG:  sinus brady at 58.  No ST or T wave changes     Assessment / Plan:   1. Coronary artery disease:  stable, no angina, very  active   2. Hyperlipidemia:  lipids look great.  Also managed by perini   3.  Diabetes Mellitus:      Mertie Moores, MD  07/27/2020 10:52 AM    [facility] [place],  [place], [place]  [address] Phone: [phone]; Fax: [phone]

## 2020-07-27 ENCOUNTER — Other Ambulatory Visit: Payer: Self-pay

## 2020-07-27 ENCOUNTER — Ambulatory Visit: Payer: PPO | Admitting: Cardiovascular Disease

## 2020-07-27 ENCOUNTER — Encounter: Payer: Self-pay | Admitting: Cardiovascular Disease

## 2020-07-27 VITALS — BP 108/70 | HR 58 | Ht 69.0 in | Wt 198.2 lb

## 2020-07-27 DIAGNOSIS — I2510 Atherosclerotic heart disease of native coronary artery without angina pectoris: Secondary | ICD-10-CM | POA: Diagnosis not present

## 2020-07-27 MED ORDER — CLOPIDOGREL BISULFATE 75 MG PO TABS: 75.0000 mg | ORAL_TABLET | Freq: Every day | ORAL | 3 refills | Status: DC

## 2020-07-27 NOTE — Patient Instructions (Signed)
Medication Instructions:   Your physician recommends that you continue on your current medications as directed. Please refer to the Current Medication list given to you today.  *If you need a refill on your cardiac medications before your next appointment, please call your pharmacy*   Lab Work: Lipids, ALT, BMET  Your physician recommends that you return for lab work in: 1 year on the day of or a few days before your office visit with Dr. Acie Fredrickson.  You will need to FAST for this appointment - nothing to eat or drink after midnight the night before except water.  If you have labs (blood work) drawn today and your tests are completely normal, you will receive your results only by: [place] (if you have MyChart) OR A paper copy in the mail If you have any lab test that is abnormal or we need to change your treatment, we will call you to review the results.   Testing/Procedures: none   Follow-Up: At [facility], you and your health needs are our priority.  As part of our continuing mission to provide you with exceptional heart care, we have created designated Provider Care Teams.  These Care Teams include your primary Cardiologist (physician) and Advanced Practice Providers (APPs -  Physician Assistants and Nurse Practitioners) who all work together to provide you with the care you need, when you need it.  We recommend signing up for the patient portal called "MyChart".  Sign up information is provided on this After Visit Summary.  MyChart is used to connect with patients for Virtual Visits (Telemedicine).  Patients are able to view lab/test results, encounter notes, upcoming appointments, etc.  Non-urgent messages can be sent to your provider as well.   To learn more about what you can do with MyChart, go to [email or web address].    Your next appointment:    1 year(s)  The format for your next appointment:    In Person  Provider:   You may see Mertie Moores, MD or one of  the following Advanced Practice Providers on your designated Care Team:   Richardson Dopp, PA-C [place], [place]

## 2020-08-19 ENCOUNTER — Other Ambulatory Visit: Payer: Self-pay | Admitting: Cardiovascular Disease

## 2020-09-21 ENCOUNTER — Other Ambulatory Visit: Payer: Self-pay

## 2020-09-21 ENCOUNTER — Ambulatory Visit: Payer: PPO | Admitting: Podiatry

## 2020-09-21 ENCOUNTER — Encounter: Payer: Self-pay | Admitting: Podiatry

## 2020-09-21 DIAGNOSIS — S39012A Strain of muscle, fascia and tendon of lower back, initial encounter: Secondary | ICD-10-CM | POA: Insufficient documentation

## 2020-09-21 DIAGNOSIS — E1142 Type 2 diabetes mellitus with diabetic polyneuropathy: Secondary | ICD-10-CM

## 2020-09-21 DIAGNOSIS — K219 Gastro-esophageal reflux disease without esophagitis: Secondary | ICD-10-CM | POA: Diagnosis not present

## 2020-09-21 DIAGNOSIS — B351 Tinea unguium: Secondary | ICD-10-CM | POA: Diagnosis not present

## 2020-09-21 DIAGNOSIS — R053 Chronic cough: Secondary | ICD-10-CM | POA: Diagnosis not present

## 2020-09-21 DIAGNOSIS — R490 Dysphonia: Secondary | ICD-10-CM | POA: Diagnosis not present

## 2020-09-21 DIAGNOSIS — M62830 Muscle spasm of back: Secondary | ICD-10-CM | POA: Insufficient documentation

## 2020-09-21 DIAGNOSIS — M79676 Pain in unspecified toe(s): Secondary | ICD-10-CM | POA: Diagnosis not present

## 2020-09-21 NOTE — Progress Notes (Signed)
  Subjective:  Patient ID: Hunter Stakes., male    DOB: 1945/10/14,  MRN: [id number]  Hunter Stakes. presents to clinic today for preventative diabetic foot care and painful thick toenails that are difficult to trim. Pain interferes with ambulation. Aggravating factors include wearing enclosed shoe gear. Pain is relieved with periodic professional debridement.  Patient states blood glucose was 94 mg/dl on yesterday. Patient did not check blood glucose today.  He states he is applying the compounded topical antifungal from [place] twice daily and he feels the bottle doesn't last [place]. He has purchased Nurse, mental health for his toenails.  PCP is Hunter Infante, MD , and last visit was 09/17/2020.  Allergies  Allergen Reactions   Hydrocodone     Other reaction(s): nauseated   Methocarbamol     Other reaction(s): nauseated   Oxycodone Nausea Only    Review of Systems: Negative except as noted in the HPI. Objective:   Constitutional Hunter Mcbride. is a pleasant 75 y.o. Caucasian male, WD, WN in NAD. AAO x 3.   Vascular Capillary refill time to digits immediate b/l. Palpable DP pulse(s) b/l lower extremities Palpable PT pulse(s) b/l lower extremities Pedal hair present. Lower extremity skin temperature gradient within normal limits. No pain with calf compression b/l. No edema noted b/l lower extremities. No cyanosis or clubbing noted.  Neurologic Normal speech. Oriented to person, place, and time. Protective sensation decreased with 10 gram monofilament b/l. Vibratory sensation intact b/l.  Dermatologic Pedal skin with normal turgor, texture and tone b/l lower extremities. No open wounds b/l lower extremities. No interdigital macerations b/l lower extremities. Toenails 1-5 b/l elongated, discolored, dystrophic, thickened, crumbly with subungual debris and tenderness to dorsal palpation.  Orthopedic: Normal muscle strength 5/5 to all lower extremity muscle groups bilaterally. Hammertoe(s)  noted to the 2-5 bilaterally.   Radiographs: None Assessment:   1. Pain due to onychomycosis of toenail   2. Diabetic peripheral neuropathy associated with type 2 diabetes mellitus ([place])    Plan:  -We discussed him completing the compounded topical antifungal solution from [place]. It only needs to be applied once daily. Once he is complete with that, he can use his Brandy Hale. Discussed Formula 7 with him, but since he has spent money on the [place], he may use that bottle. Informed him medication needs to be applied consistently to see any results. He does not want to try oral medication and I am in agreement with this due to polypharmacy. We will reassess on his next visit.. -Continue diabetic foot care principles. -Patient to continue soft, supportive shoe gear daily. -Toenails 1-5 b/l were debrided in length and girth with sterile nail nippers and dremel without iatrogenic bleeding.  -Patient to report any pedal injuries to medical professional immediately. -Patient/POA to call should there be question/concern in the interim.  Return in about 3 months (around 12/22/2020).  Marzetta Board, DPM

## 2020-09-28 ENCOUNTER — Other Ambulatory Visit: Payer: Self-pay | Admitting: *Deleted

## 2020-09-28 MED ORDER — NITROGLYCERIN 0.4 MG SL SUBL: SUBLINGUAL_TABLET | SUBLINGUAL | 3 refills | Status: DC

## 2020-10-01 ENCOUNTER — Telehealth: Payer: Self-pay | Admitting: Cardiovascular Disease

## 2020-10-01 NOTE — Telephone Encounter (Signed)
Pt is returning a call  

## 2020-10-02 MED ORDER — NITROGLYCERIN 0.4 MG SL SUBL: SUBLINGUAL_TABLET | SUBLINGUAL | 3 refills | Status: DC

## 2020-10-02 NOTE — Telephone Encounter (Signed)
*  STAT* If patient is at the pharmacy, call can be transferred to refill team.   1. Which medications need to be refilled? (please list name of each medication and dose if known) nitroGLYCERIN (NITROSTAT) 0.4 MG SL tablet  2. Which pharmacy/location (including street and city if local pharmacy) is medication to be sent to? [facility]  [phone]  1. Do they need a 30 day or 90 day supply? 90 day supply   . Pharmacy is calling in this medication has not been sent over for the pt.He also states this needs to be his primary pharmacy.

## 2020-10-14 ENCOUNTER — Encounter: Payer: Self-pay | Admitting: Physician Assistant

## 2020-10-14 ENCOUNTER — Telehealth: Payer: Self-pay | Admitting: *Deleted

## 2020-10-14 ENCOUNTER — Ambulatory Visit: Payer: PPO | Admitting: Physician Assistant

## 2020-10-14 VITALS — BP 118/70 | HR 62 | Ht 69.0 in | Wt 195.0 lb

## 2020-10-14 DIAGNOSIS — I2584 Coronary atherosclerosis due to calcified coronary lesion: Secondary | ICD-10-CM | POA: Diagnosis not present

## 2020-10-14 DIAGNOSIS — R1319 Other dysphagia: Secondary | ICD-10-CM | POA: Diagnosis not present

## 2020-10-14 DIAGNOSIS — I2510 Atherosclerotic heart disease of native coronary artery without angina pectoris: Secondary | ICD-10-CM | POA: Diagnosis not present

## 2020-10-14 DIAGNOSIS — R490 Dysphonia: Secondary | ICD-10-CM

## 2020-10-14 DIAGNOSIS — Z86010 Personal history of colonic polyps: Secondary | ICD-10-CM | POA: Diagnosis not present

## 2020-10-14 DIAGNOSIS — Z7901 Long term (current) use of anticoagulants: Secondary | ICD-10-CM

## 2020-10-14 MED ORDER — CLENPIQ 10-3.5-12 MG-GM -GM/160ML PO SOLN: 1.0000 | Freq: Once | ORAL | 0 refills | Status: AC

## 2020-10-14 NOTE — Telephone Encounter (Signed)
Left message for patient to call office. Per Dr. Bryan Lemma would like to add on a Bravo for the patient.

## 2020-10-14 NOTE — Telephone Encounter (Signed)
Spoke with patient's wife and informed her of adding on Bravo and would send out instructions. Patient's wife voiced understanding.

## 2020-10-14 NOTE — Telephone Encounter (Signed)
Dr. Acie Fredrickson,  Pt has a history of CAD/CABG, no intervention during last cath in 2021.   OK to hold plavix x 5 days for colonoscopy?

## 2020-10-14 NOTE — Patient Instructions (Signed)
You have been scheduled for an endoscopy and colonoscopy. Please follow the written instructions given to you at your visit today. Please pick up your prep supplies at the pharmacy within the next 1-3 days. If you use inhalers (even only as needed), please bring them with you on the day of your procedure.  If you are age 74 or older, your body mass index should be between 23-30. Your Body mass index is 28.8 kg/m. If this is out of the aforementioned range listed, please consider follow up with your Primary Care Provider.  If you are age 94 or younger, your body mass index should be between 19-25. Your Body mass index is 28.8 kg/m. If this is out of the aformentioned range listed, please consider follow up with your Primary Care Provider.   __________________________________________________________  The [facility] providers would like to encourage you to use [facility] to communicate with providers for non-urgent requests or questions.  Due to [facility] hold times on the telephone, sending your provider a message by [facility] may be a faster and more efficient way to get a response.  Please allow 48 business hours for a response.  Please remember that this is for non-urgent requests.

## 2020-10-14 NOTE — Progress Notes (Signed)
Agree with the assessment and plan as outlined by Ellouise Newer, PA-C.  Agree with plan for colonoscopy for ongoing polyp surveillance.  Agree with EGD to evaluate for erosive esophagitis along with possible esophageal dilation.  Additionally, given concern for continued breakthrough reflux despite high-dose acid suppression therapy, can also schedule for Bravo to be done at that same time.  This can be done while taking acid suppression therapy as we are looking for breakthrough despite appropriate medical management.  Agree with Plavix hold as outlined.  Syreeta Figler, DO, [facility]

## 2020-10-14 NOTE — Progress Notes (Signed)
Chief Complaint: GERD, recall colonoscopy  HPI:    Mr. Hunter Mcbride is a very pleasant 75 year old male with a past medical history of CAD status post stenting in 2015 on Plavix as well as CABG 9/18, reflux, MI and type 2 diabetes, known to Dr. Loletha Carrow, who was referred to me by Crist Infante, MD for a complaint of reflux and need for recall colonoscopy.    09/14/2017 colonoscopy due for heme positive stool no low ferritin with diverticulosis in the left and right colon, 4 3-6 mm polyps in the transverse and ascending colon and otherwise normal.  Pathology showed tubular adenomas.  Repeat recommended in 3 years.    09/14/2017 EGD was normal.   Pathology showed reactive gastropathy.    07/26/2019 CBC with a normal hemoglobin.    02/19/2020 BMP with a creatinine 1.38.    07/27/2020 follow-up with Dr. Acie Fredrickson in cardiology.  Doing well.    Today, the patient presents to clinic accompanied by his wife who does assist with history.  He tells me that he knows he is due for his colonoscopy but wanted to talk about some hoarseness and throat clearing that he has been having trouble with.  Explains that over the past 3 months he has noticed this and tells me if he talks too [place] or if he sings too many songs at church then he just cannot talk anymore.  Along with this also notes that over the past month when he has been eating he has gotten choked multiple times.  Typically this is with bread products like a biscuit or cake, typically he will just drink a glass of milk and then it will go down.  His wife also tells me that she hears his stomach "gurgling at night".  Currently he is still on his Pantoprazole 40 mg twice a day and Pepcid 20 mg twice a day.  Tells me that his cardiologist is reluctant to change any of this due to the fact that he is on Plavix and a lot of the other medicines can decrease absorption.  Does not feel like he is having any overt heartburn or reflux symptoms.  Does describe being on Zyrtec and Flonase  nasal spray for a couple of weeks from his PCP but this did not help with his hoarseness or symptoms.    Denies fever, chills, weight loss, blood in his stool or symptoms that awaken him from sleep.     Past Medical History:  Diagnosis Date   Arthritis    "lower back; right shoulder; left thumb; joints" (06/21/2013)   Asthma    "not sure if this is true or not" (06/21/2013)   CAD (coronary artery disease) 06/2013   s/p PTCA/DES x 3 to mid LAD 2015. b. s/p CABG in 2018. c. cath 12/2017 -> med rx.   GERD (gastroesophageal reflux disease)    Gout    "maybe twice in my life"   Headache(784.0)    "weekly for the last 3-4 months" (06/21/2013)   Hyperlipidemia    Hypertension    Myocardial infarction ([place])    "/Dr. Carlton Adam had one between 2014-2015) (06/21/2013)   Osteoarthritis    Sleep apnea    WEIGHT LOSS, NO LONGER NEEDS PER PATIENT   Type II diabetes mellitus ([place])    TYPE 2    Past Surgical History:  Procedure Laterality Date   CARDIAC CATHETERIZATION  1980's   "once"   CARDIAC CATHETERIZATION N/A 07/23/2015   Procedure: Left Heart Cath  and Coronary Angiography;  Surgeon: Sherren Mocha, MD;  Location: [place] [facility];  Service: Cardiovascular;  Laterality: N/A;   CARDIOVASCULAR STRESS TEST  06/10/2008   EF 68%   CARPAL TUNNEL RELEASE Bilateral    CORONARY ANGIOPLASTY WITH STENT PLACEMENT  06/21/2013   "3"   CORONARY ARTERY BYPASS GRAFT N/A 11/07/2016   Procedure: CORONARY ARTERY BYPASS GRAFTING (CABG) x 5 (LIMA to DISTAL LAD, SVG to DIAGONAL, SVG to CIRCUMFLEX, and SVG SEQUENTIALLY to PLB and DISTAL PDA) with EVH of the RIGHT GREATER SAPHENOUS VEIN and LEFT INTERNAL MAMMARY ARTERY HARVEST;  Surgeon: Grace Isaac, MD;  Location: [place];  Service: Open Heart Surgery;  Laterality: N/A;   CORONARY STENT INTERVENTION N/A 06/07/2016   Procedure: Coronary Stent Intervention;  Surgeon: Burnell Blanks, MD;  Location: [place] [facility];  Service: Cardiovascular;  Laterality: N/A;    EXCISION MORTON'S NEUROMA Left    EYE SURGERY Left    "removed film over my eye"   FRACTIONAL FLOW RESERVE WIRE N/A 06/26/2013   Procedure: [place];  Surgeon: [name], MD;  Location: [place] [facility];  Service: Cardiovascular;  Laterality: N/A;   HERNIA REPAIR Left    INTRAVASCULAR PRESSURE WIRE/FFR STUDY N/A 10/28/2016   Procedure: INTRAVASCULAR PRESSURE WIRE/FFR STUDY;  Surgeon: Leonie Man, MD;  Location: [place] [facility];  Service: Cardiovascular;  Laterality: N/A;   JOINT REPLACEMENT Left 03/2013   "thumb"   LEFT HEART CATH AND CORONARY ANGIOGRAPHY N/A 06/07/2016   Procedure: Left Heart Cath and Coronary Angiography;  Surgeon: Burnell Blanks, MD;  Location: [place] [facility];  Service: Cardiovascular;  Laterality: N/A;   LEFT HEART CATH AND CORONARY ANGIOGRAPHY N/A 10/28/2016   Procedure: LEFT HEART CATH AND CORONARY ANGIOGRAPHY;  Surgeon: Leonie Man, MD;  Location: [place] [facility];  Service: Cardiovascular;  Laterality: N/A;   LEFT HEART CATH AND CORS/GRAFTS ANGIOGRAPHY N/A 12/25/2017   Procedure: LEFT HEART CATH AND CORS/GRAFTS ANGIOGRAPHY;  Surgeon: Sherren Mocha, MD;  Location: [place] [facility];  Service: Cardiovascular;  Laterality: N/A;   LEFT HEART CATH AND CORS/GRAFTS ANGIOGRAPHY N/A 07/26/2019   Procedure: LEFT HEART CATH AND CORS/GRAFTS ANGIOGRAPHY;  Surgeon: [name], MD;  Location: [place] [facility];  Service: Cardiovascular;  Laterality: N/A;   LEFT HEART CATHETERIZATION WITH CORONARY ANGIOGRAM N/A 06/21/2013   Procedure: LEFT HEART CATHETERIZATION WITH CORONARY ANGIOGRAM;  Surgeon: Burnell Blanks, MD;  Location: [facility];  Service: Cardiovascular;  Laterality: N/A;   LEFT HEART CATHETERIZATION WITH CORONARY ANGIOGRAM N/A 06/26/2013   Procedure: LEFT HEART CATHETERIZATION WITH CORONARY ANGIOGRAM;  Surgeon: [name], MD;  Location: [place] [facility];  Service: Cardiovascular;  Laterality: N/A;   LUMBAR  LAMINECTOMY/DECOMPRESSION MICRODISCECTOMY Left 05/30/2018   Procedure: Left  Gill L5 decompression;  Surgeon: Melina Schools, MD;  Location: [facility] [place];  Service: Orthopedics;  Laterality: Left;  180min   TEE WITHOUT CARDIOVERSION N/A 11/07/2016   Procedure: TRANSESOPHAGEAL ECHOCARDIOGRAM (TEE);  Surgeon: Grace Isaac, MD;  Location: [place];  Service: Open Heart Surgery;  Laterality: N/A;   TONSILLECTOMY     TOTAL HIP ARTHROPLASTY Right 04/12/2016   Procedure: RIGHT TOTAL HIP ARTHROPLASTY ANTERIOR APPROACH;  Surgeon: Paralee Cancel, MD;  Location: [facility];  Service: Orthopedics;  Laterality: Right;  requests 70 mins   TOTAL SHOULDER ARTHROPLASTY  03/01/2012   Procedure: TOTAL SHOULDER ARTHROPLASTY;  Surgeon: Marin Shutter, MD;  Location: [place];  Service: Orthopedics;  Laterality: Right;   UMBILICAL HERNIA REPAIR      Current Outpatient Medications  Medication Sig Dispense Refill   acetaminophen (TYLENOL) 325 MG tablet Take 325 mg by mouth as needed.     ALPRAZolam (XANAX) 0.5 MG tablet TAKE ONE TABLET BY MOUTH UP TO TWICE A DAY AS NEEDED FOR ANXIETY     amLODipine (NORVASC) 5 MG tablet Take 1 tablet (5 mg total) by mouth 2 (two) times daily. 180 tablet 2   aspirin EC 81 MG tablet Take 81 mg by mouth at bedtime.      atenolol (TENORMIN) 50 MG tablet TAKE ONE TABLET BY MOUTH ONE TIME DAILY at suppertime     atorvastatin (LIPITOR) 80 MG tablet Take 1 tablet by mouth daily.     Bioflavonoid Products (SUPER C-500) TABS Take 1 tablet by mouth daily.     calcium carbonate (TUMS - DOSED IN MG ELEMENTAL CALCIUM) 500 MG chewable tablet Chew 2 tablets by mouth as needed for indigestion or heartburn.      canagliflozin (INVOKANA) 300 MG TABS tablet Take 1 tablet by mouth daily.     ciclopirox (LOPROX) 0.77 % cream Apply topically.     clopidogrel (PLAVIX) 75 MG tablet Take 1 tablet (75 mg total) by mouth daily. TAKE 1 TABLET BY MOUTH ONCE A DAY WITH BREAKFAST 90 tablet 3   docusate sodium (COLACE) 100 MG  capsule Take 100 mg by mouth at bedtime.     famotidine (PEPCID) 20 MG tablet Take 20 mg by mouth 2 (two) times daily.     FREESTYLE TEST STRIPS test strip 1 each by Other route every morning.      gabapentin (NEURONTIN) 300 MG capsule Take 1 capsule by mouth 3 (three) times daily.     hydrochlorothiazide (HYDRODIURIL) 25 MG tablet Take 1 tablet by mouth every morning.     irbesartan (AVAPRO) 300 MG tablet Take 300 mg by mouth at bedtime.      isosorbide mononitrate (IMDUR) 30 MG 24 hr tablet Take 1 tablet by mouth once daily 90 tablet 3   metFORMIN (GLUCOPHAGE-XR) 500 MG 24 hr tablet Take 1 tablet (500 mg total) by mouth 2 (two) times daily.     mupirocin ointment (BACTROBAN) 2 % Apply to affected area three times a day as needed     Naftifine HCl 2 % CREA APPLY TO AFFECTED AREA(S) TWICE A DAY FOR YEAST     nitroGLYCERIN (NITROSTAT) 0.4 MG SL tablet DISSOLVE 1 TABLET UNDER TONGUE AS NEEDEDFOR CHEST PAIN. MAY REPEAT 5 MINUTES APART 3 TIMES IF NEEDED 25 tablet 3   NONFORMULARY OR COMPOUNDED ITEM Apply 1 application topically See admin instructions. Antifungal solution: Terbinafine 3%, Fluconazole 2%, Tea Tree Oil 5%, Urea 10%, Ibuprofen 2% in DMSO suspension: Apply to the toenails daily as directed     pantoprazole (PROTONIX) 40 MG tablet Take 1 tablet by mouth 2 (two) times daily.     polyethylene glycol (MIRALAX / GLYCOLAX) 17 g packet 1 packet mixed with 8 ounces of fluid     promethazine (PHENERGAN) 12.5 MG tablet 1 tablet as needed     ranolazine (RANEXA) 500 MG 12 hr tablet TAKE ONE TABLET BY MOUTH TWICE DAILY 180 tablet 3   TRESIBA FLEXTOUCH 100 UNIT/ML FlexTouch Pen Inject 18 Units into the skin daily.     Vitamin D, Cholecalciferol, 25 MCG (1000 UT) CAPS Take 1 capsule by mouth daily.     No current facility-administered medications for this visit.  Allergies as of 10/14/2020 - Review Complete 10/14/2020  Allergen Reaction Noted   Hydrocodone  02/28/2020   Methocarbamol  02/28/2020    Oxycodone Nausea Only 12/08/2016    Family History  Problem Relation Age of Onset   Diabetes type II Mother    Heart attack Father    Coronary artery disease Cousin    Heart disease Brother     Social History   Socioeconomic History   Marital status: Married    Spouse name: Not on file   Number of children: Not on file   Years of education: Not on file   Highest education level: Not on file  Occupational History   Not on file  Tobacco Use   Smoking status: Never   Smokeless tobacco: Never  Vaping Use   Vaping Use: Never used  Substance and Sexual Activity   Alcohol use: No   Drug use: No   Sexual activity: Yes  Other Topics Concern   Not on file  Social History Narrative   Not on file   Social Determinants of Health   Financial Resource Strain: Not on file  Food Insecurity: Not on file  Transportation Needs: Not on file  Physical Activity: Not on file  Stress: Not on file  Social Connections: Not on file  Intimate Partner Violence: Not on file    Review of Systems:    Constitutional: No weight loss, fever or chills Skin: No rash  Cardiovascular: No chest pain Respiratory: No SOB Gastrointestinal: See HPI and otherwise negative Genitourinary: No dysuria  Neurological: No headache, dizziness or syncope Musculoskeletal: No new muscle or joint pain Hematologic: No bleeding  Psychiatric: No history of depression or anxiety   Physical Exam:  Vital signs: BP 118/70   Pulse 62   Ht 5\' 9"  (1.753 m)   Wt 195 lb (88.5 kg)   SpO2 99%   BMI 28.80 kg/m    Constitutional:   Pleasant Caucasian male appears to be in NAD, Well developed, Well nourished, alert and cooperative Head:  Normocephalic and atraumatic. Eyes:   PEERL, EOMI. No icterus. Conjunctiva pink. Ears:  Normal auditory acuity. Neck:  Supple Throat: Oral cavity and pharynx without inflammation, swelling or lesion.  Respiratory: Respirations even and unlabored. Lungs clear to auscultation  bilaterally.   No wheezes, crackles, or rhonchi.  Cardiovascular: Normal S1, S2. No MRG. Regular rate and rhythm. No peripheral edema, cyanosis or pallor.  Gastrointestinal:  Soft, nondistended, mild epigastric ttp, No rebound or guarding. Normal bowel sounds. No appreciable masses or hepatomegaly. Rectal:  Not performed.  Msk:  Symmetrical without gross deformities. Without edema, no deformity or joint abnormality.  Neurologic:  Alert and  oriented x4;  grossly normal neurologically.  Skin:   Dry and intact without significant lesions or rashes. Psychiatric: Demonstrates good judgement and reason without abnormal affect or behaviors.  RELEVANT LABS AND IMAGING: CBC    Component Value Date/Time   WBC 4.0 07/26/2019 0052   RBC 4.71 07/26/2019 0052   HGB 14.2 07/26/2019 0052   HGB 10.5 (L) 11/28/2016 1057   HCT 42.4 07/26/2019 0052   HCT 31.7 (L) 11/28/2016 1057   PLT 127 (L) 07/26/2019 0052   PLT 240 11/28/2016 1057   MCV 90.0 07/26/2019 0052   MCV 84 11/28/2016 1057   MCH 30.1 07/26/2019 0052   MCHC 33.5 07/26/2019 0052   RDW 12.7 07/26/2019 0052   RDW 13.9 11/28/2016 1057   LYMPHSABS 1.5 07/25/2019 1704   MONOABS 0.3 07/25/2019 1704  EOSABS 0.0 07/25/2019 1704   BASOSABS 0.0 07/25/2019 1704    CMP     Component Value Date/Time   NA 154 (H) 02/19/2020 0850   K 4.4 02/19/2020 0850   CL 111 (H) 02/19/2020 0850   CO2 18 (L) 02/19/2020 0850   GLUCOSE 109 (H) 02/19/2020 0850   GLUCOSE 148 (H) 07/26/2019 0052   BUN 23 02/19/2020 0850   CREATININE 1.38 (H) 02/19/2020 0850   CREATININE 0.92 07/22/2015 0921   CALCIUM 10.0 02/19/2020 0850   PROT 7.2 02/19/2020 0850   ALBUMIN 4.6 02/19/2020 0850   AST 17 02/19/2020 0850   ALT 24 02/19/2020 0850   ALKPHOS 88 02/19/2020 0850   BILITOT 0.5 02/19/2020 0850   GFRNONAA 50 (L) 02/19/2020 0850   GFRAA 58 (L) 02/19/2020 0850    Assessment: 1.  Hoarseness/throat clearing: Over the past 3 months, consider relation to uncontrolled  reflux, did try treatment for allergies which did not help 2.  GERD: Symptoms mostly controlled on Pantoprazole 40 twice a day and Pepcid 20 mg twice a day 3.  Dysphagia: Bread products getting stuck in his throat over the past month with an increase in hoarseness/throat clearing; consider GERD+/- stricture versus other 4.  History of adenomatous polyps: Last colonoscopy in 2019 with recommendations to repeat in 3 years  Plan: 1.  Scheduled patient for surveillance colonoscopy and diagnostic EGD with dilation in the [place] with Dr. Bryan Lemma as he had sooner availability than Dr. Loletha Carrow.  Did provide the patient with a detailed list of risks for the procedures and he agrees to proceed. Patient is appropriate for endoscopic procedure(s) in the ambulatory ([place]) setting.  2.  Discussed antidysphagia measures such as taking small bites, chewing well, drinking sips of water in between bites and avoiding distraction while eating. 3.  For now continue Pantoprazole 40 mg twice a day and Pepcid 20 mg twice a day.  May need to discuss alternate medications if there are signs of inflammation/gastritis or reflux bearing in mind he is on Plavix. 4.  Patient was advised to hold his Plavix for 5 days prior to time of procedures.  We will communicate with his prescribing physician to ensure this is acceptable for him. 5.  Patient to follow in clinic per recommendations from Dr. Bryan Lemma after time of procedure  Ellouise Newer, PA-C [facility] 10/14/2020, 10:55 AM  Cc: Crist Infante, MD

## 2020-10-14 NOTE — Telephone Encounter (Signed)
[facility] Pre-operative Risk Assessment     Request for surgical clearance:     Endoscopy Procedure  What type of surgery is being performed?     Endo/colon  When is this surgery scheduled?     Friday 10/23/20  What type of clearance is required ?   Pharmacy  Are there any medications that need to be held prior to surgery and how Medici? Plavix 5 days  Practice name and name of physician performing surgery?      [facility]  What is your office phone and fax number?      Phone- [phone]  [phone]  Anesthesia type (None, local, MAC, general) ?       MAC

## 2020-10-15 NOTE — Progress Notes (Signed)
I appreciate the evaluation of this patient.  I also agree with pH testing because his reported hoarseness and throat clearing are atypical symptoms for reflux.  As such, and since he will have the Bravo study on acid suppression, an impedance study may yet be needed to answer the question of possible breakthrough non-acid reflux. If no mechanical cause of dysphagia seen on EGD, this may indicate a laryngeal cause of his throat symptoms.  - H. Danis

## 2020-10-16 NOTE — Telephone Encounter (Signed)
   Primary Cardiologist: Mertie Moores, MD  Chart reviewed as part of pre-operative protocol coverage. Given past medical history and time since last visit, based on ACC/AHA guidelines, Hunter Mcbride. would be at acceptable risk for the planned procedure without further cardiovascular testing.   His Plavix may be held for 5 days prior to his procedure.  Please resume as soon as hemostasis is achieved.  I will route this recommendation to the requesting party via Epic fax function and remove from pre-op pool.  Please call with questions.  Jossie Ng. Chonte Ricke NP-C    10/16/2020, 7:10 AM [name] Office [phone] Fax [phone]

## 2020-10-20 DIAGNOSIS — D1809 Hemangioma of other sites: Secondary | ICD-10-CM | POA: Diagnosis not present

## 2020-10-20 DIAGNOSIS — L237 Allergic contact dermatitis due to plants, except food: Secondary | ICD-10-CM | POA: Diagnosis not present

## 2020-10-20 DIAGNOSIS — B354 Tinea corporis: Secondary | ICD-10-CM | POA: Diagnosis not present

## 2020-10-20 DIAGNOSIS — R52 Pain, unspecified: Secondary | ICD-10-CM | POA: Diagnosis not present

## 2020-10-20 DIAGNOSIS — D692 Other nonthrombocytopenic purpura: Secondary | ICD-10-CM | POA: Diagnosis not present

## 2020-10-20 DIAGNOSIS — D2272 Melanocytic nevi of left lower limb, including hip: Secondary | ICD-10-CM | POA: Diagnosis not present

## 2020-10-20 DIAGNOSIS — D485 Neoplasm of uncertain behavior of skin: Secondary | ICD-10-CM | POA: Diagnosis not present

## 2020-10-20 DIAGNOSIS — Z85828 Personal history of other malignant neoplasm of skin: Secondary | ICD-10-CM | POA: Diagnosis not present

## 2020-10-20 DIAGNOSIS — D1801 Hemangioma of skin and subcutaneous tissue: Secondary | ICD-10-CM | POA: Diagnosis not present

## 2020-10-20 DIAGNOSIS — L820 Inflamed seborrheic keratosis: Secondary | ICD-10-CM | POA: Diagnosis not present

## 2020-10-20 NOTE — Addendum Note (Signed)
Addended by: Horris Latino on: 10/20/2020 08:30 AM   Modules accepted: Orders

## 2020-10-20 NOTE — Telephone Encounter (Signed)
Patient and patient's wife informed to hold Plavix. They went ahead and stopped Plavix on Sat.

## 2020-10-21 DIAGNOSIS — E1169 Type 2 diabetes mellitus with other specified complication: Secondary | ICD-10-CM | POA: Diagnosis not present

## 2020-10-21 DIAGNOSIS — E291 Testicular hypofunction: Secondary | ICD-10-CM | POA: Diagnosis not present

## 2020-10-21 DIAGNOSIS — E785 Hyperlipidemia, unspecified: Secondary | ICD-10-CM | POA: Diagnosis not present

## 2020-10-21 DIAGNOSIS — Z125 Encounter for screening for malignant neoplasm of prostate: Secondary | ICD-10-CM | POA: Diagnosis not present

## 2020-10-22 DIAGNOSIS — R82998 Other abnormal findings in urine: Secondary | ICD-10-CM | POA: Diagnosis not present

## 2020-10-22 NOTE — Telephone Encounter (Signed)
Inbound call from patient. Need to know if Bravo is covered. States she tried to [employer] and they did not know what the procedure is. He have endo bravo scheduled for 9/9. Best contact number [phone]

## 2020-10-23 ENCOUNTER — Telehealth: Payer: Self-pay | Admitting: Gastroenterology

## 2020-10-23 ENCOUNTER — Ambulatory Visit (AMBULATORY_SURGERY_CENTER): Payer: PPO | Admitting: Gastroenterology

## 2020-10-23 ENCOUNTER — Other Ambulatory Visit: Payer: Self-pay

## 2020-10-23 ENCOUNTER — Encounter: Payer: Self-pay | Admitting: Gastroenterology

## 2020-10-23 ENCOUNTER — Ambulatory Visit: Payer: PPO | Admitting: Gastroenterology

## 2020-10-23 VITALS — BP 139/70 | HR 53 | Temp 97.5°F | Resp 17 | Ht 69.0 in | Wt 195.0 lb

## 2020-10-23 DIAGNOSIS — K5730 Diverticulosis of large intestine without perforation or abscess without bleeding: Secondary | ICD-10-CM

## 2020-10-23 DIAGNOSIS — K648 Other hemorrhoids: Secondary | ICD-10-CM | POA: Diagnosis not present

## 2020-10-23 DIAGNOSIS — R490 Dysphonia: Secondary | ICD-10-CM

## 2020-10-23 DIAGNOSIS — K219 Gastro-esophageal reflux disease without esophagitis: Secondary | ICD-10-CM | POA: Diagnosis not present

## 2020-10-23 DIAGNOSIS — Z538 Procedure and treatment not carried out for other reasons: Secondary | ICD-10-CM | POA: Diagnosis not present

## 2020-10-23 DIAGNOSIS — R12 Heartburn: Secondary | ICD-10-CM | POA: Diagnosis not present

## 2020-10-23 DIAGNOSIS — K644 Residual hemorrhoidal skin tags: Secondary | ICD-10-CM | POA: Diagnosis not present

## 2020-10-23 DIAGNOSIS — K2970 Gastritis, unspecified, without bleeding: Secondary | ICD-10-CM

## 2020-10-23 DIAGNOSIS — G4733 Obstructive sleep apnea (adult) (pediatric): Secondary | ICD-10-CM | POA: Diagnosis not present

## 2020-10-23 DIAGNOSIS — Z86010 Personal history of colonic polyps: Secondary | ICD-10-CM

## 2020-10-23 DIAGNOSIS — I2510 Atherosclerotic heart disease of native coronary artery without angina pectoris: Secondary | ICD-10-CM | POA: Diagnosis not present

## 2020-10-23 DIAGNOSIS — R1310 Dysphagia, unspecified: Secondary | ICD-10-CM

## 2020-10-23 DIAGNOSIS — R053 Chronic cough: Secondary | ICD-10-CM | POA: Diagnosis not present

## 2020-10-23 DIAGNOSIS — K2990 Gastroduodenitis, unspecified, without bleeding: Secondary | ICD-10-CM

## 2020-10-23 DIAGNOSIS — E119 Type 2 diabetes mellitus without complications: Secondary | ICD-10-CM | POA: Diagnosis not present

## 2020-10-23 MED ORDER — SODIUM CHLORIDE 0.9 % IV SOLN: 500.0000 mL | Freq: Once | INTRAVENOUS | Status: DC

## 2020-10-23 NOTE — Progress Notes (Signed)
Report to PACU, RN, vss, BBS= Clear.  

## 2020-10-23 NOTE — Progress Notes (Signed)
Capsule expiration date- 05/28/21  Capsule ID number [id number]  LES measurement: 40 (capsule placed 6 cm above LES) 34  Time of implant: 1350

## 2020-10-23 NOTE — Patient Instructions (Signed)
Post-op Bravo pH instructions Once you get home:  Eat normally and go about your daily routine/activities Limit drinking fluids or eating between meals Do not chew gum or eat hard candy DO NOT take any antacid or anti-reflux medications during the 48-hour monitoring time, unless instructed by your physician  Recording events: Events to be recorded are:  Record using event buttons on recorder and write on paper diary form Every time you eat or drink something (other than water) 2.   Periods of lying down/reclining 3.  Symptoms:  may include heartburn, regurgitation, chest pain, cough or specify if other.  A paper diary is also provided to record the times of your reflux symptoms and times for meals and when you lie down.  The recorder needs to remain within 3 feet (arms length) of you during the testing period (48 hours). If you should forget and move outside of a 3-foot radius of the receiver you may hear beeping and you will see a "C1" error in the display window on the top of the receiver.  Please pick up the receiver and hold close to you to re-establish the connection and the error message disappears.  You may take a bath/shower during the testing period, but the recorder must not get wet and must remain within 3 feet of you. Please leave the receiver outside of the shower or tub while bathing. The monitoring period will be for 48 hours after placement of the capsule.  At the end of the 48 hours, you will return the recorder, and your diary, to our [facility] front desk.  A nurse will meet you to collect the device and answer any questions you may have.  The device should turn off once the 48 hours is complete.   What to expect after placement of the capsule:  Some patients experience a vague sensation that something is in their esophagus or that they 'feel' the capsule when they swallow food.  Should you experience this, chewing food carefully or drinking liquids may  minimize this sensation.   After the test is complete, the disposable capsule will fall off the wall of your esophagus within 5-10 days and pass naturally with your bowel movement through the digestive tract.  Once the recorder is returned, your provider will review and interpret your recordings and contact you to discuss your results.  This may take up to two weeks.   DO NOT have an MRI for 30 days after your procedure to ensure the capsule is no longer inside your body  It is imperative that you return the recorder on _________________________ by 3:00pm.  Your information must be downloaded at this time to obtain your results.      YOU HAD AN ENDOSCOPIC PROCEDURE TODAY AT [facility]:   Refer to the procedure report that was given to you for any specific questions about what was found during the examination.  If the procedure report does not answer your questions, please call your gastroenterologist to clarify.  If you requested that your care partner not be given the details of your procedure findings, then the procedure report has been included in a sealed envelope for you to review at your convenience later.  YOU SHOULD EXPECT: Some feelings of bloating in the abdomen. Passage of more gas than usual.  Walking can help get rid of the air that was put into your GI tract during the procedure and reduce the bloating. If you had a lower endoscopy (  such as a colonoscopy or flexible sigmoidoscopy) you may notice spotting of blood in your stool or on the toilet paper. If you underwent a bowel prep for your procedure, you may not have a normal bowel movement for a few days.  Please Note:  You might notice some irritation and congestion in your nose or some drainage.  This is from the oxygen used during your procedure.  There is no need for concern and it should clear up in a day or so.  SYMPTOMS TO REPORT IMMEDIATELY:  Following lower endoscopy (colonoscopy or flexible  sigmoidoscopy):  Excessive amounts of blood in the stool  Significant tenderness or worsening of abdominal pains  Swelling of the abdomen that is new, acute  Fever of 100F or higher  Following upper endoscopy (EGD)  Vomiting of blood or coffee ground material  New chest pain or pain under the shoulder blades  Painful or persistently difficult swallowing  New shortness of breath  Fever of 100F or higher  Black, tarry-looking stools  For urgent or emergent issues, a gastroenterologist can be reached at any hour by calling [phone]. Do not use MyChart messaging for urgent concerns.    DIET:  We do recommend a small meal at first, but then you may proceed to your regular diet.  Drink plenty of fluids but you should avoid alcoholic beverages for 24 hours.  ACTIVITY:  You should plan to take it easy for the rest of today and you should NOT DRIVE or use heavy machinery until tomorrow (because of the sedation medicines used during the test).    FOLLOW UP: Our staff will call the number listed on your records 48-72 hours following your procedure to check on you and address any questions or concerns that you may have regarding the information given to you following your procedure. If we do not reach you, we will leave a message.  We will attempt to reach you two times.  During this call, we will ask if you have developed any symptoms of COVID 19. If you develop any symptoms (ie: fever, flu-like symptoms, shortness of breath, cough etc.) before then, please call [phone].  If you test positive for Covid 19 in the 2 weeks post procedure, please call and report this information to [place].    If any biopsies were taken you will be contacted by phone or by letter within the next 1-3 weeks.  Please call us at [phone] if you have not heard about the biopsies in 3 weeks.    SIGNATURES/CONFIDENTIALITY: You and/or your care partner have signed paperwork which will be entered into your  electronic medical record.  These signatures attest to the fact that that the information above on your After Visit Summary has been reviewed and is understood.  Full responsibility of the confidentiality of this discharge information lies with you and/or your care-partner.   Resume previous diet. Continue present medications. Resume Protonix 40 mg. Two times daily andPepcil 20 mg. Daily today.  Await pathology results.  Ok to resume Plavix tomorrow as prescribed.  Follow-up with Ellouise Newer or Dr. Loletha Carrow in the GI clinic at appointment to be scheduled.  Repeat colonoscopy at next available appointment (within 3 months) because bowel prep was poor.

## 2020-10-23 NOTE — Progress Notes (Signed)
CHECK-IN-AER  V/S-DT  PT. Last dose of protonix and pepcid was 10/19/20,wife stated "we just got the paper work in mail yesterday and they call and told them and they stopped when they received call.completed teaching for bravo with pt. And wife and they verbalize understanding,informing dr. Berneice Gandy  last ppi.

## 2020-10-23 NOTE — Progress Notes (Deleted)
Called to room to assist during endoscopic procedure.  Patient ID and intended procedure confirmed with present staff. Received instructions for my participation in the procedure from the performing physician.  

## 2020-10-23 NOTE — Progress Notes (Signed)
Pt. In restroom for a while after disconnected from monitors to pass air and stool.  F/U colonoscopy booked, Bravo instructions reviewed with pt. And wife, hence delayed D/C time from [facility].

## 2020-10-23 NOTE — Op Note (Signed)
[facility] Patient Name: Hunter Mcbride Procedure Date: 10/23/2020 1:48 PM MRN: [id number] Endoscopist: Gerrit Heck , MD Age: 75 Referring MD:  Date of Birth: [birth year] Gender: Male Account #: [id number] Procedure:                Upper GI endoscopy with BRAVO placement (on PPI)                            and biopsy Indications:              Heartburn, Suspected esophageal reflux despite                            continued acid suppression therapy. Patient stopped                            PPI and H2RA 4 days ago. Medicines:                Monitored Anesthesia Care Procedure:                Pre-Anesthesia Assessment:                           - Prior to the procedure, a History and Physical                            was performed, and patient medications and                            allergies were reviewed. The patient's tolerance of                            previous anesthesia was also reviewed. The risks                            and benefits of the procedure and the sedation                            options and risks were discussed with the patient.                            All questions were answered, and informed consent                            was obtained. Prior Anticoagulants: The patient has                            taken Plavix (clopidogrel), last dose was 5 days                            prior to procedure. ASA Grade Assessment: III - A                            patient with severe systemic disease. After  reviewing the risks and benefits, the patient was                            deemed in satisfactory condition to undergo the                            procedure.                           After obtaining informed consent, the endoscope was                            passed under direct vision. Throughout the                            procedure, the patient's blood pressure, pulse, and                             oxygen saturations were monitored continuously. The                            [id number] #[id number] was introduced through the                            mouth, and advanced to the second part of duodenum.                            The upper GI endoscopy was accomplished without                            difficulty. The patient tolerated the procedure                            well. Scope In: Scope Out: Findings:                 The Z-line was jagged but otherwise regular in                            appearance and was found 40 cm from the incisors.                            The BRAVO capsule with delivery system was                            introduced through the mouth and advanced into the                            esophagus, such that the BRAVO pH capsule was                            positioned 34 cm from the incisors, which was 6 cm  proximal to the GE junction. The BRAVO pH capsule                            was then deployed and attached to the esophageal                            mucosa. The delivery system was then withdrawn.                            Endoscopy was utilized for probe placement and                            diagnostic evaluation. The scope was reinserted to                            evaluate placement of the BRAVO capsule.                            Visualization showed the BRAVO capsule to be in an                            appropriate position.                           Patchy mildly erythematous mucosa without bleeding                            was found in the gastric fundus, in the gastric                            body and in the gastric antrum.                           The examined duodenum was normal. Complications:            No immediate complications. Estimated Blood Loss:     Estimated blood loss was minimal. Impression:               - Z-line regular, 40 cm from the incisors.                           -  Erythematous mucosa in the gastric fundus,                            gastric body and antrum.                           - Normal examined duodenum.                           - The BRAVO pH capsule was deployed.                           - No specimens collected. Recommendation:           - Patient has a contact  number available for                            emergencies. The signs and symptoms of potential                            delayed complications were discussed with the                            patient. Return to normal activities tomorrow.                            Written discharge instructions were provided to the                            patient.                           - Resume previous diet.                           - Continue present medications.                           - Resume Protonix 40 mg BID and Pepcid 20 mg daily                            today. Plan is to evaluate for breakthrough reflux                            despite ongoing acid suppression therapy.                           - Await pathology results.                           - Ok to resume Plavix tomorrow as prescribed.                           - Follow-up with Ellouise Newer or Dr. Loletha Carrow in                            the GI clinic at appointment to be scheduled. Gerrit Heck, MD 10/23/2020 2:19:18 PM

## 2020-10-23 NOTE — Progress Notes (Signed)
GASTROENTEROLOGY PROCEDURE H&P NOTE   Primary Care Physician: Crist Infante, MD    Reason for Procedure:   GERD, colon cancer screening  Plan:    EGD with Bravo placement, Colonoscopy  Patient is appropriate for endoscopic procedure(s) in the ambulatory ([place]) setting.  The nature of the procedure, as well as the risks, benefits, and alternatives were carefully and thoroughly reviewed with the patient. Ample time for discussion and questions allowed. The patient understood, was satisfied, and agreed to proceed.     HPI: Hunter Mcbride. is a 75 y.o. male who presents for EGD with Bravo placement and colonoscopy. Patient was last seen in the GI Clinic on 10/14/2020. Please see note from that day for full details. Otherwise, no changes in clincical hx in the interim.   Last dose of Protonix was on 10/19/2020. Plan today is for Bravo on therapy to eval for breakthrough reflux despite PPI. Will have patient resume PPI today. Last Pepcid also 10/19/2020.  Past Medical History:  Diagnosis Date   Arthritis    "lower back; right shoulder; left thumb; joints" (06/21/2013)   Asthma    "not sure if this is true or not" (06/21/2013)   CAD (coronary artery disease) 06/2013   s/p PTCA/DES x 3 to mid LAD 2015. b. s/p CABG in 2018. c. cath 12/2017 -> med rx.   Cancer ([place])    GERD (gastroesophageal reflux disease)    Gout    "maybe twice in my life"   Headache(784.0)    "weekly for the last 3-4 months" (06/21/2013)   Hyperlipidemia    Hypertension    Myocardial infarction ([place])    "/Dr. Katharina Caper I've had one between 2014-2015) (06/21/2013)   Osteoarthritis    Sleep apnea    WEIGHT LOSS, NO LONGER NEEDS PER PATIENT   Type II diabetes mellitus ([place])    TYPE 2    Past Surgical History:  Procedure Laterality Date   CARDIAC CATHETERIZATION  1980's   "once"   CARDIAC CATHETERIZATION N/A 07/23/2015   Procedure: Left Heart Cath and Coronary Angiography;  Surgeon: Sherren Mocha, MD;  Location: [facility];  Service: Cardiovascular;  Laterality: N/A;   CARDIOVASCULAR STRESS TEST  06/10/2008   EF 68%   CARPAL TUNNEL RELEASE Bilateral    COLONOSCOPY     CORONARY ANGIOPLASTY WITH STENT PLACEMENT  06/21/2013   "3"   CORONARY ARTERY BYPASS GRAFT N/A 11/07/2016   Procedure: CORONARY ARTERY BYPASS GRAFTING (CABG) x 5 (LIMA to DISTAL LAD, SVG to DIAGONAL, SVG to CIRCUMFLEX, and SVG SEQUENTIALLY to PLB and DISTAL PDA) with EVH of the RIGHT GREATER SAPHENOUS VEIN and LEFT INTERNAL MAMMARY ARTERY HARVEST;  Surgeon: Grace Isaac, MD;  Location: [facility];  Service: Open Heart Surgery;  Laterality: N/A;   CORONARY STENT INTERVENTION N/A 06/07/2016   Procedure: Coronary Stent Intervention;  Surgeon: Burnell Blanks, MD;  Location: [facility];  Service: Cardiovascular;  Laterality: N/A;   EXCISION MORTON'S NEUROMA Left    EYE SURGERY Left    "removed film over my eye"   FRACTIONAL FLOW RESERVE WIRE N/A 06/26/2013   Procedure: [place];  Surgeon: [name], MD;  Location: [facility];  Service: Cardiovascular;  Laterality: N/A;   HERNIA REPAIR Left    INTRAVASCULAR PRESSURE WIRE/FFR STUDY N/A 10/28/2016   Procedure: INTRAVASCULAR PRESSURE WIRE/FFR STUDY;  Surgeon: Leonie Man, MD;  Location: [facility];  Service: Cardiovascular;  Laterality: N/A;  JOINT REPLACEMENT Left 03/2013   "thumb"   LEFT HEART CATH AND CORONARY ANGIOGRAPHY N/A 06/07/2016   Procedure: Left Heart Cath and Coronary Angiography;  Surgeon: Burnell Blanks, MD;  Location: [facility];  Service: Cardiovascular;  Laterality: N/A;   LEFT HEART CATH AND CORONARY ANGIOGRAPHY N/A 10/28/2016   Procedure: LEFT HEART CATH AND CORONARY ANGIOGRAPHY;  Surgeon: Leonie Man, MD;  Location: [facility];  Service: Cardiovascular;  Laterality: N/A;   LEFT HEART CATH AND CORS/GRAFTS ANGIOGRAPHY N/A 12/25/2017   Procedure: LEFT HEART CATH AND CORS/GRAFTS  ANGIOGRAPHY;  Surgeon: Sherren Mocha, MD;  Location: [facility];  Service: Cardiovascular;  Laterality: N/A;   LEFT HEART CATH AND CORS/GRAFTS ANGIOGRAPHY N/A 07/26/2019   Procedure: LEFT HEART CATH AND CORS/GRAFTS ANGIOGRAPHY;  Surgeon: [name], MD;  Location: [facility];  Service: Cardiovascular;  Laterality: N/A;   LEFT HEART CATHETERIZATION WITH CORONARY ANGIOGRAM N/A 06/21/2013   Procedure: LEFT HEART CATHETERIZATION WITH CORONARY ANGIOGRAM;  Surgeon: Burnell Blanks, MD;  [facility];  Service: Cardiovascular;  Laterality: N/A;   LEFT HEART CATHETERIZATION WITH CORONARY ANGIOGRAM N/A 06/26/2013   Procedure: LEFT HEART CATHETERIZATION WITH CORONARY ANGIOGRAM;  Surgeon: [name], MD;  Location: [place] [facility];  Service: Cardiovascular;  Laterality: N/A;   LUMBAR LAMINECTOMY/DECOMPRESSION MICRODISCECTOMY Left 05/30/2018   Procedure: Left  Gill L5 decompression;  Surgeon: Melina Schools, MD;  Location: [facility];  Service: Orthopedics;  Laterality: Left;  167min   POLYPECTOMY     TEE WITHOUT CARDIOVERSION N/A 11/07/2016   Procedure: TRANSESOPHAGEAL ECHOCARDIOGRAM (TEE);  Surgeon: Grace Isaac, MD;  Location: [facility];  Service: Open Heart Surgery;  Laterality: N/A;   TONSILLECTOMY     TOTAL HIP ARTHROPLASTY Right 04/12/2016   Procedure: RIGHT TOTAL HIP ARTHROPLASTY ANTERIOR APPROACH;  Surgeon: Paralee Cancel, MD;  Location: [facility];  Service: Orthopedics;  Laterality: Right;  requests 70 mins   TOTAL SHOULDER ARTHROPLASTY  03/01/2012   Procedure: TOTAL SHOULDER ARTHROPLASTY;  Surgeon: Marin Shutter, MD;  Location: [place];  Service: Orthopedics;  Laterality: Right;   UMBILICAL HERNIA REPAIR      Prior to Admission medications   Medication Sig Start Date End Date Taking? Authorizing Provider  ALPRAZolam (XANAX) 0.5 MG tablet TAKE ONE TABLET BY MOUTH UP TO TWICE A DAY AS NEEDED FOR ANXIETY 04/01/20  Yes [provider]  amLODipine (NORVASC)  5 MG tablet Take 1 tablet (5 mg total) by mouth 2 (two) times daily. 06/17/20  Yes Nahser, Wonda Cheng, MD  aspirin EC 81 MG tablet Take 81 mg by mouth at bedtime.    Yes [provider]  atenolol (TENORMIN) 50 MG tablet TAKE ONE TABLET BY MOUTH ONE TIME DAILY at suppertime   Yes [provider]  atorvastatin (LIPITOR) 80 MG tablet Take 1 tablet by mouth daily.   Yes [provider]  Bioflavonoid Products (SUPER C-500) TABS Take 1 tablet by mouth daily.   Yes [provider]  calcium carbonate (TUMS - DOSED IN MG ELEMENTAL CALCIUM) 500 MG chewable tablet Chew 2 tablets by mouth as needed for indigestion or heartburn.    Yes [provider]  canagliflozin (INVOKANA) 300 MG TABS tablet Take 1 tablet by mouth daily.   Yes [provider]  cetirizine (ZYRTEC) 10 MG tablet Take 10 mg by mouth daily.   Yes [provider]  docusate sodium (COLACE) 100 MG capsule Take 100 mg by mouth  2 (two) times daily.   Yes [provider]  FREESTYLE TEST STRIPS test strip 1 each by Other route every morning.  01/23/18  Yes [provider]  gabapentin (NEURONTIN) 300 MG capsule Take 300 mg by mouth daily.   Yes [provider]  hydrochlorothiazide (HYDRODIURIL) 25 MG tablet Take 1 tablet by mouth every morning.   Yes [provider]  irbesartan (AVAPRO) 300 MG tablet Take 300 mg by mouth at bedtime.    Yes [provider]  isosorbide mononitrate (IMDUR) 30 MG 24 hr tablet Take 1 tablet by mouth once daily 08/19/20  Yes Nahser, Wonda Cheng, MD  metFORMIN (GLUCOPHAGE-XR) 500 MG 24 hr tablet Take 1 tablet (500 mg total) by mouth 2 (two) times daily. 07/29/19  Yes Isaiah Serge, NP  ranolazine (RANEXA) 500 MG 12 hr tablet TAKE ONE TABLET BY MOUTH TWICE DAILY 08/19/20  Yes Nahser, Wonda Cheng, MD  TRESIBA FLEXTOUCH 100 UNIT/ML FlexTouch Pen Inject 18 Units into the skin daily. 02/11/20  Yes [provider]  Vitamin D,  Cholecalciferol, 25 MCG (1000 UT) CAPS Take 1 capsule by mouth daily. 01/12/09  Yes [provider]  acetaminophen (TYLENOL) 325 MG tablet Take 325 mg by mouth as needed. 12/14/10   [provider]  ciclopirox (LOPROX) 0.77 % cream Apply topically. 06/18/20   [provider]  clopidogrel (PLAVIX) 75 MG tablet Take 1 tablet (75 mg total) by mouth daily. TAKE 1 TABLET BY MOUTH ONCE A DAY WITH BREAKFAST 07/27/20   Nahser, Wonda Cheng, MD  famotidine (PEPCID) 20 MG tablet Take 20 mg by mouth 2 (two) times daily.    [provider]  mupirocin ointment (BACTROBAN) 2 % Apply to affected area three times a day as needed    [provider]  Naftifine HCl 2 % CREA APPLY TO AFFECTED AREA(S) TWICE A DAY FOR YEAST    [provider]  nitroGLYCERIN (NITROSTAT) 0.4 MG SL tablet DISSOLVE 1 TABLET UNDER TONGUE AS NEEDEDFOR CHEST PAIN. MAY REPEAT 5 MINUTES APART 3 TIMES IF NEEDED 10/02/20   Nahser, Wonda Cheng, MD  NONFORMULARY OR COMPOUNDED ITEM Apply 1 application topically See admin instructions. Antifungal solution: Terbinafine 3%, Fluconazole 2%, Tea Tree Oil 5%, Urea 10%, Ibuprofen 2% in DMSO suspension: Apply to the toenails daily as directed Patient not taking: Reported on 10/23/2020 07/26/19   Isaiah Serge, NP  pantoprazole (PROTONIX) 40 MG tablet Take 1 tablet by mouth 2 (two) times daily.    [provider]  polyethylene glycol (MIRALAX / GLYCOLAX) 17 g packet Take 17 g by mouth daily.    [provider]  promethazine (PHENERGAN) 12.5 MG tablet 1 tablet as needed 12/10/19   [provider]    Current Outpatient Medications  Medication Sig Dispense Refill   ALPRAZolam (XANAX) 0.5 MG tablet TAKE ONE TABLET BY MOUTH UP TO TWICE A DAY AS NEEDED FOR ANXIETY     amLODipine (NORVASC) 5 MG tablet Take 1 tablet (5 mg total) by mouth 2 (two) times daily. 180 tablet 2   aspirin EC 81 MG tablet Take 81 mg by mouth at bedtime.      atenolol  (TENORMIN) 50 MG tablet TAKE ONE TABLET BY MOUTH ONE TIME DAILY at suppertime     atorvastatin (LIPITOR) 80 MG tablet Take 1 tablet by mouth daily.     Bioflavonoid Products (SUPER C-500) TABS Take 1 tablet by mouth daily.     calcium carbonate (TUMS - DOSED IN  MG ELEMENTAL CALCIUM) 500 MG chewable tablet Chew 2 tablets by mouth as needed for indigestion or heartburn.      canagliflozin (INVOKANA) 300 MG TABS tablet Take 1 tablet by mouth daily.     cetirizine (ZYRTEC) 10 MG tablet Take 10 mg by mouth daily.     docusate sodium (COLACE) 100 MG capsule Take 100 mg by mouth 2 (two) times daily.     FREESTYLE TEST STRIPS test strip 1 each by Other route every morning.      gabapentin (NEURONTIN) 300 MG capsule Take 300 mg by mouth daily.     hydrochlorothiazide (HYDRODIURIL) 25 MG tablet Take 1 tablet by mouth every morning.     irbesartan (AVAPRO) 300 MG tablet Take 300 mg by mouth at bedtime.      isosorbide mononitrate (IMDUR) 30 MG 24 hr tablet Take 1 tablet by mouth once daily 90 tablet 3   metFORMIN (GLUCOPHAGE-XR) 500 MG 24 hr tablet Take 1 tablet (500 mg total) by mouth 2 (two) times daily.     ranolazine (RANEXA) 500 MG 12 hr tablet TAKE ONE TABLET BY MOUTH TWICE DAILY 180 tablet 3   TRESIBA FLEXTOUCH 100 UNIT/ML FlexTouch Pen Inject 18 Units into the skin daily.     Vitamin D, Cholecalciferol, 25 MCG (1000 UT) CAPS Take 1 capsule by mouth daily.     acetaminophen (TYLENOL) 325 MG tablet Take 325 mg by mouth as needed.     ciclopirox (LOPROX) 0.77 % cream Apply topically.     clopidogrel (PLAVIX) 75 MG tablet Take 1 tablet (75 mg total) by mouth daily. TAKE 1 TABLET BY MOUTH ONCE A DAY WITH BREAKFAST 90 tablet 3   famotidine (PEPCID) 20 MG tablet Take 20 mg by mouth 2 (two) times daily.     mupirocin ointment (BACTROBAN) 2 % Apply to affected area three times a day as needed     Naftifine HCl 2 % CREA APPLY TO AFFECTED AREA(S) TWICE A DAY FOR YEAST     nitroGLYCERIN (NITROSTAT) 0.4 MG SL  tablet DISSOLVE 1 TABLET UNDER TONGUE AS NEEDEDFOR CHEST PAIN. MAY REPEAT 5 MINUTES APART 3 TIMES IF NEEDED 25 tablet 3   NONFORMULARY OR COMPOUNDED ITEM Apply 1 application topically See admin instructions. Antifungal solution: Terbinafine 3%, Fluconazole 2%, Tea Tree Oil 5%, Urea 10%, Ibuprofen 2% in DMSO suspension: Apply to the toenails daily as directed (Patient not taking: Reported on 10/23/2020)     pantoprazole (PROTONIX) 40 MG tablet Take 1 tablet by mouth 2 (two) times daily.     polyethylene glycol (MIRALAX / GLYCOLAX) 17 g packet Take 17 g by mouth daily.     promethazine (PHENERGAN) 12.5 MG tablet 1 tablet as needed     Current Facility-Administered Medications  Medication Dose Route Frequency Provider Last Rate Last Admin   0.9 %  sodium chloride infusion  500 mL Intravenous Once Isis Costanza V, DO        Allergies as of 10/23/2020 - Review Complete 10/23/2020  Allergen Reaction Noted   Hydrocodone  02/28/2020   Methocarbamol  02/28/2020   Oxycodone Nausea Only 12/08/2016    Family History  Problem Relation Age of Onset   Colon polyps Mother    Diabetes type II Mother    Heart disease Father    Heart attack Father    Heart disease Brother    Coronary artery disease Cousin    Colon cancer Neg Hx    Pancreatic cancer Neg Hx  Esophageal cancer Neg Hx    Stomach cancer Neg Hx    Liver disease Neg Hx    Rectal cancer Neg Hx     Social History   Socioeconomic History   Marital status: Married    Spouse name: Not on file   Number of children: Not on file   Years of education: Not on file   Highest education level: Not on file  Occupational History   Not on file  Tobacco Use   Smoking status: Never    Passive exposure: Never   Smokeless tobacco: Never  Vaping Use   Vaping Use: Never used  Substance and Sexual Activity   Alcohol use: No   Drug use: No   Sexual activity: Yes  Other Topics Concern   Not on file  Social History Narrative   Not on file    Social Determinants of Health   Financial Resource Strain: Not on file  Food Insecurity: Not on file  Transportation Needs: Not on file  Physical Activity: Not on file  Stress: Not on file  Social Connections: Not on file  Intimate Partner Violence: Not on file    Physical Exam: Vital signs in last 24 hours: @BP  (!) 141/73   Pulse (!) 54   Temp (!) 97.5 F (36.4 C)   Resp 11   Ht 5\' 9"  (1.753 m)   Wt 195 lb (88.5 kg)   SpO2 99%   BMI 28.80 kg/m  GEN: NAD EYE: Sclerae anicteric ENT: MMM CV: Non-tachycardic Pulm: CTA b/l GI: Soft, NT/ND NEURO:  Alert & Oriented x 3   Gerrit Heck, DO [facility]   10/23/2020 1:38 PM

## 2020-10-23 NOTE — Op Note (Signed)
[facility] Patient Name: Hunter Mcbride Procedure Date: 10/23/2020 1:57 PM MRN: [id number] Endoscopist: Gerrit Heck , MD Age: 75 Referring MD:  Date of Birth: 23-Apr-1945 Gender: Male Account #: [id number] Procedure:                Colonoscopy Indications:              Surveillance: Personal history of adenomatous                            polyps on last colonoscopy 3 years ago                           Last colonoscopy was 09/14/2017 and notable for                            diverticulosis in the left and right colon, four                            3-6 mm polyps in the transverse and ascending colon                            and otherwise normal. Pathology showed tubular                            adenomas. Repeat recommended in 3 years. Otherwise,                            no active lower GI symptoms. Medicines:                Monitored Anesthesia Care Procedure:                Pre-Anesthesia Assessment:                           - Prior to the procedure, a History and Physical                            was performed, and patient medications and                            allergies were reviewed. The patient's tolerance of                            previous anesthesia was also reviewed. The risks                            and benefits of the procedure and the sedation                            options and risks were discussed with the patient.                            All questions were answered, and informed consent  was obtained. Prior Anticoagulants: The patient has                            taken Plavix (clopidogrel), last dose was 5 days                            prior to procedure. ASA Grade Assessment: III - A                            patient with severe systemic disease. After                            reviewing the risks and benefits, the patient was                            deemed in satisfactory condition to undergo the                             procedure.                           After obtaining informed consent, the colonoscope                            was passed under direct vision. Throughout the                            procedure, the patient's blood pressure, pulse, and                            oxygen saturations were monitored continuously. The                            Olympus CF-HQ190L (Serial# [id number]) Colonoscope was                            introduced through the anus and advanced to the the                            cecum, identified by appendiceal orifice and                            ileocecal valve. The colonoscopy was technically                            difficult and complex due to poor bowel prep. The                            patient tolerated the procedure well. The quality                            of the bowel preparation was poor. The ileocecal  valve, appendiceal orifice, and rectum were                            photographed. Scope In: 1:59:03 PM Scope Out: 2:07:49 PM Scope Withdrawal Time: 0 hours 5 minutes 40 seconds  Total Procedure Duration: 0 hours 8 minutes 46 seconds  Findings:                 External hemorrhoids were found on perianal exam.                           A large amount of semi-liquid stool was found in                            the entire colon, interfering with visualization.                            Lavage of the area was performed using copious                            amounts of tap water, resulting in incomplete                            clearance with continued poor visualization.                           Multiple small and large-mouthed diverticula were                            found in the sigmoid colon and ascending colon.                           Internal hemorrhoids were found during retroflexion. Complications:            No immediate complications. Estimated Blood Loss:     Estimated blood  loss: none. Impression:               - Preparation of the colon was poor.                           - Hemorrhoids found on perianal exam.                           - Stool in the entire examined colon.                           - Diverticulosis in the sigmoid colon and in the                            ascending colon.                           - External and internal hemorrhoids.                           - No specimens collected. Recommendation:           -  Patient has a contact number available for                            emergencies. The signs and symptoms of potential                            delayed complications were discussed with the                            patient. Return to normal activities tomorrow.                            Written discharge instructions were provided to the                            patient.                           - Resume previous diet.                           - Continue present medications.                           - Repeat colonoscopy at next available appointment                            (within 3 months) because the bowel preparation was                            poor. Gerrit Heck, MD 10/23/2020 2:24:54 PM

## 2020-10-23 NOTE — Telephone Encounter (Signed)
error 

## 2020-10-26 ENCOUNTER — Telehealth: Payer: Self-pay

## 2020-10-26 NOTE — Telephone Encounter (Signed)
Per 10/23/20 procedure report - Follow up with Ellouise Newer or Dr. Loletha Carrow in the GI clinic at appt to be scheduled.   Patient has been scheduled for a follow up with Ellouise Newer, PA-C on Tuesday, 12/01/20 at 10 am. Letter mailed and sent to patient via my chart with appt information.

## 2020-10-27 ENCOUNTER — Telehealth: Payer: Self-pay | Admitting: *Deleted

## 2020-10-27 NOTE — Telephone Encounter (Signed)
  Follow up Call-  Call back number 10/23/2020  Post procedure Call Back phone  # [phone]  Permission to leave phone message Yes  Some recent data might be hidden     Patient questions:  Do you have a fever, pain , or abdominal swelling? No. Pain Score  0 *  Have you tolerated food without any problems? Yes.    Have you been able to return to your normal activities? Yes.    Do you have any questions about your discharge instructions: Diet   No. Medications  No. Follow up visit  No.  Do you have questions or concerns about your Care? No.  Actions: * If pain score is 4 or above: No action needed, pain <4.

## 2020-10-28 DIAGNOSIS — Z1212 Encounter for screening for malignant neoplasm of rectum: Secondary | ICD-10-CM | POA: Diagnosis not present

## 2020-10-28 DIAGNOSIS — Z1331 Encounter for screening for depression: Secondary | ICD-10-CM | POA: Diagnosis not present

## 2020-10-28 DIAGNOSIS — I2510 Atherosclerotic heart disease of native coronary artery without angina pectoris: Secondary | ICD-10-CM | POA: Diagnosis not present

## 2020-10-28 DIAGNOSIS — Z23 Encounter for immunization: Secondary | ICD-10-CM | POA: Diagnosis not present

## 2020-10-28 DIAGNOSIS — E1169 Type 2 diabetes mellitus with other specified complication: Secondary | ICD-10-CM | POA: Diagnosis not present

## 2020-10-28 DIAGNOSIS — Z0000 Encounter for general adult medical examination without abnormal findings: Secondary | ICD-10-CM | POA: Diagnosis not present

## 2020-10-28 DIAGNOSIS — R1310 Dysphagia, unspecified: Secondary | ICD-10-CM | POA: Diagnosis not present

## 2020-10-28 DIAGNOSIS — Z1389 Encounter for screening for other disorder: Secondary | ICD-10-CM | POA: Diagnosis not present

## 2020-10-28 DIAGNOSIS — R053 Chronic cough: Secondary | ICD-10-CM | POA: Diagnosis not present

## 2020-10-28 DIAGNOSIS — M1990 Unspecified osteoarthritis, unspecified site: Secondary | ICD-10-CM | POA: Diagnosis not present

## 2020-10-28 DIAGNOSIS — D7589 Other specified diseases of blood and blood-forming organs: Secondary | ICD-10-CM | POA: Diagnosis not present

## 2020-10-28 DIAGNOSIS — E785 Hyperlipidemia, unspecified: Secondary | ICD-10-CM | POA: Diagnosis not present

## 2020-10-28 DIAGNOSIS — I10 Essential (primary) hypertension: Secondary | ICD-10-CM | POA: Diagnosis not present

## 2020-11-03 ENCOUNTER — Telehealth: Payer: Self-pay

## 2020-11-03 NOTE — Telephone Encounter (Signed)
-----   Message from Vivian, DO sent at 10/30/2020  1:12 PM EDT ----- Regarding: FW: Bravo EGD with Bravo completed on 9/9 and demonstrates abnormal esophageal acid exposure with pH<4 and 6.9% of the time.  DeMeester score was 16.2.  However, no symptom correlation for cough based on SAP (only 2 episodes of cough, and neither correlate with reflux events).  These results should be taken with a grain of salt, however, as the patient was holding his PPI x4 days prior to the study.  We had intended to do the study on medications to look for breakthrough reflux symptoms despite appropriate acid suppression therapy.  He then resumed taking acid suppression therapy during the study itself.  With the negative symptom correlation and the low (but technically abnormal) DeMeester score, may be able to come to a conclusion that his reflux is largely well controlled.  Interestingly, when you look at the [place] tracing, his acid reflux events were very early in the study, then no esophageal acid exposure after about 6 hours or so.  This again should allude to good control of acid when medications are back on board.  Sorry, largely equivocal results.  If we are still left scratching our heads, neck step would be transnasal pH/impedance testing on acid suppression therapy.  Patient has follow-up scheduled with Anderson Malta next month.  ----- Message ----- From: Monday, Rande Brunt, RN Sent: 10/26/2020   4:28 PM EDT To: Mauri Pole, MD, Lavena Bullion, DO Subject: Bravo                                          Sorry Mr Versteeg is Dr. Vivia Ewing patient. Bravo uploaded today.

## 2020-11-05 NOTE — Telephone Encounter (Signed)
Spoke with patient's wife in regards to his Bravo study results and recommendations. Pt's wife will relay this information to patient and he will keep follow up as scheduled with Anderson Malta next month. Advised that patient continue his current medications. Pt's wife verbalized understanding of all information and had no concerns at the end of the call.

## 2020-12-01 ENCOUNTER — Encounter: Payer: Self-pay | Admitting: Physician Assistant

## 2020-12-01 ENCOUNTER — Ambulatory Visit: Payer: PPO | Admitting: Physician Assistant

## 2020-12-01 VITALS — BP 110/64 | HR 64 | Ht 67.25 in | Wt 198.0 lb

## 2020-12-01 DIAGNOSIS — K219 Gastro-esophageal reflux disease without esophagitis: Secondary | ICD-10-CM | POA: Diagnosis not present

## 2020-12-01 DIAGNOSIS — R490 Dysphonia: Secondary | ICD-10-CM | POA: Diagnosis not present

## 2020-12-01 DIAGNOSIS — Z86010 Personal history of colonic polyps: Secondary | ICD-10-CM

## 2020-12-01 MED ORDER — FAMOTIDINE 40 MG PO TABS: 40.0000 mg | ORAL_TABLET | Freq: Two times a day (BID) | ORAL | 5 refills | Status: AC

## 2020-12-01 MED ORDER — NA SULFATE-K SULFATE-MG SULF 17.5-3.13-1.6 GM/177ML PO SOLN: 1.0000 | Freq: Once | ORAL | 0 refills | Status: AC

## 2020-12-01 NOTE — Progress Notes (Signed)
Chief Complaint: Follow-up hoarseness  HPI:    Hunter Mcbride is a 75 year old male with a past medical history as listed below including CAD status post stenting in 2015 on Plavix as well as CABG 9/18, GERD and multiple others, known to Dr. Loletha Carrow, who returns to clinic today for follow-up of hoarseness.    09/14/2017 colonoscopy due for heme positive stool no low ferritin with diverticulosis in the left and right colon, 4 3-6 mm polyps in the transverse and ascending colon and otherwise normal.  Pathology showed tubular adenomas.  Repeat recommended in 3 years.    09/14/2017 EGD was normal.   Pathology showed reactive gastropathy.    07/26/2019 CBC with a normal hemoglobin.    02/19/2020 BMP with a creatinine 1.38.    07/27/2020 follow-up with Dr. Acie Fredrickson in cardiology.  Doing well.    10/14/2020 patient seen in clinic by me and described hoarseness and clearing of his throat.  Also some trouble with "getting choked".  At that time schedule patient for surveillance colonoscopy and diagnostic EGD with manometry.  This is scheduled with Dr. Bryan Lemma as he had availability.  He was continued on Pantoprazole 40 twice daily and Pepcid 20 twice a day.    10/23/2020 EGD with Bravo was Z-line regular 40 cm from incisors, erythematous mucosa in the gastric fundus, body and antrum.  Bravo with evidence of significant reflux disease.  Discussed that results should be taken with a grain of salt given that he was holding his PPI for 4 days prior to the study.  It was noted that likely patient was well controlled on medicine.  He was discussed that if patient continued with symptoms could consider transnasal pH/impedance testing on acid suppression.    10/23/2020 colonoscopy with poor preparation of the colon, hemorrhoids, stool in the entire colon, diverticulosis in the sigmoid and ascending colon, external and internal hemorrhoids.  Repeat recommended in the next 3 months for screening given poor prep.     Today, patient  presents to clinic accompanied by his wife and tells me that his symptoms are mostly the same.  Wife thinks the "gurgling noise that she hears at night" is maybe some better, but otherwise he continues with hoarseness and occasional feeling of dysphagia regardless of taking Pantoprazole 40 mg twice daily and Pepcid 20 mg twice daily.    Aware that he needs a repeat colonoscopy given poor bowel prep.  He tells me he knew the Clenpiq was not working.    Denies fever, chills, weight loss, blood in his stool or symptoms that awaken him from sleep.  Past Medical History:  Diagnosis Date   Arthritis    "lower back; right shoulder; left thumb; joints" (06/21/2013)   Asthma    "not sure if this is true or not" (06/21/2013)   CAD (coronary artery disease) 06/2013   s/p PTCA/DES x 3 to mid LAD 2015. b. s/p CABG in 2018. c. cath 12/2017 -> med rx.   Cancer ([place])    GERD (gastroesophageal reflux disease)    Gout    "maybe twice in my life"   Headache(784.0)    "weekly for the last 3-4 months" (06/21/2013)   Hyperlipidemia    Hypertension    Myocardial infarction ([place])    "/Dr. Carlton Adam had one between 2014-2015) (06/21/2013)   Osteoarthritis    Sleep apnea    WEIGHT LOSS, NO LONGER NEEDS PER PATIENT   Type II diabetes mellitus ([place])    TYPE 2  Past Surgical History:  Procedure Laterality Date   CARDIAC CATHETERIZATION  1980's   "once"   CARDIAC CATHETERIZATION N/A 07/23/2015   Procedure: Left Heart Cath and Coronary Angiography;  Surgeon: Sherren Mocha, MD;  Location: [facility];  Service: Cardiovascular;  Laterality: N/A;   CARDIOVASCULAR STRESS TEST  06/10/2008   EF 68%   CARPAL TUNNEL RELEASE Bilateral    COLONOSCOPY     CORONARY ANGIOPLASTY WITH STENT PLACEMENT  06/21/2013   "3"   CORONARY ARTERY BYPASS GRAFT N/A 11/07/2016   Procedure: CORONARY ARTERY BYPASS GRAFTING (CABG) x 5 (LIMA to DISTAL LAD, SVG to DIAGONAL, SVG to CIRCUMFLEX, and SVG SEQUENTIALLY to PLB and DISTAL  PDA) with EVH of the RIGHT GREATER SAPHENOUS VEIN and LEFT INTERNAL MAMMARY ARTERY HARVEST;  Surgeon: Grace Isaac, MD;  Location: [facility];  Service: Open Heart Surgery;  Laterality: N/A;   CORONARY STENT INTERVENTION N/A 06/07/2016   Procedure: Coronary Stent Intervention;  Surgeon: Burnell Blanks, MD;  Location: [facility];  Service: Cardiovascular;  Laterality: N/A;   EXCISION MORTON'S NEUROMA Left    EYE SURGERY Left    "removed film over my eye"   FRACTIONAL FLOW RESERVE WIRE N/A 06/26/2013   Procedure: [place];  Surgeon: [name], MD;  Location: [facility];  Service: Cardiovascular;  Laterality: N/A;   HERNIA REPAIR Left    INTRAVASCULAR PRESSURE WIRE/FFR STUDY N/A 10/28/2016   Procedure: INTRAVASCULAR PRESSURE WIRE/FFR STUDY;  Surgeon: Leonie Man, MD;  Location: [facility];  Service: Cardiovascular;  Laterality: N/A;   JOINT REPLACEMENT Left 03/2013   "thumb"   LEFT HEART CATH AND CORONARY ANGIOGRAPHY N/A 06/07/2016   Procedure: Left Heart Cath and Coronary Angiography;  Surgeon: Burnell Blanks, MD;  Location: [facility];  Service: Cardiovascular;  Laterality: N/A;   LEFT HEART CATH AND CORONARY ANGIOGRAPHY N/A 10/28/2016   Procedure: LEFT HEART CATH AND CORONARY ANGIOGRAPHY;  Surgeon: Leonie Man, MD;  Location: [facility];  Service: Cardiovascular;  Laterality: N/A;   LEFT HEART CATH AND CORS/GRAFTS ANGIOGRAPHY N/A 12/25/2017   Procedure: LEFT HEART CATH AND CORS/GRAFTS ANGIOGRAPHY;  Surgeon: Sherren Mocha, MD;  Location: [facility];  Service: Cardiovascular;  Laterality: N/A;   LEFT HEART CATH AND CORS/GRAFTS ANGIOGRAPHY N/A 07/26/2019   Procedure: LEFT HEART CATH AND CORS/GRAFTS ANGIOGRAPHY;  Surgeon: [name], MD;  Location: [facility];  Service: Cardiovascular;  Laterality: N/A;   LEFT HEART CATHETERIZATION WITH CORONARY ANGIOGRAM N/A 06/21/2013   Procedure: LEFT  HEART CATHETERIZATION WITH CORONARY ANGIOGRAM;  Surgeon: Burnell Blanks, MD;  Location: [facility];  Service: Cardiovascular;  Laterality: N/A;   LEFT HEART CATHETERIZATION WITH CORONARY ANGIOGRAM N/A 06/26/2013   Procedure: LEFT HEART CATHETERIZATION WITH CORONARY ANGIOGRAM;  Surgeon: [name], MD;  Location: [facility];  Service: Cardiovascular;  Laterality: N/A;   LUMBAR LAMINECTOMY/DECOMPRESSION MICRODISCECTOMY Left 05/30/2018   Procedure: Left  [place] L5 decompression;  Surgeon: Melina Schools, MD;  Location: [facility];  Service: Orthopedics;  Laterality: Left;  13min   POLYPECTOMY     TEE WITHOUT CARDIOVERSION N/A 11/07/2016   Procedure: TRANSESOPHAGEAL ECHOCARDIOGRAM (TEE);  Surgeon: Grace Isaac, MD;  Location: [facility];  Service: Open Heart Surgery;  Laterality: N/A;   TONSILLECTOMY     TOTAL HIP ARTHROPLASTY Right 04/12/2016   Procedure: RIGHT TOTAL HIP ARTHROPLASTY ANTERIOR APPROACH;  Surgeon: Paralee Cancel, MD;  Location: [facility];  Service: Orthopedics;  Laterality: Right;  requests 70 mins   TOTAL SHOULDER ARTHROPLASTY  03/01/2012   Procedure: TOTAL SHOULDER ARTHROPLASTY;  Surgeon: Marin Shutter, MD;  Location: [facility];  Service: Orthopedics;  Laterality: Right;   UMBILICAL HERNIA REPAIR      Current Outpatient Medications  Medication Sig Dispense Refill   acetaminophen (TYLENOL) 325 MG tablet Take 325 mg by mouth as needed.     ALPRAZolam (XANAX) 0.5 MG tablet TAKE ONE TABLET BY MOUTH UP TO TWICE A DAY AS NEEDED FOR ANXIETY     amLODipine (NORVASC) 5 MG tablet Take 1 tablet (5 mg total) by mouth 2 (two) times daily. 180 tablet 2   aspirin EC 81 MG tablet Take 81 mg by mouth at bedtime.      atenolol (TENORMIN) 50 MG tablet TAKE ONE TABLET BY MOUTH ONE TIME DAILY at suppertime     atorvastatin (LIPITOR) 80 MG tablet Take 1 tablet by mouth daily.     Bioflavonoid Products (SUPER C-500) TABS Take 1 tablet by mouth daily.     calcium carbonate (TUMS - DOSED IN MG  ELEMENTAL CALCIUM) 500 MG chewable tablet Chew 2 tablets by mouth as needed for indigestion or heartburn.      canagliflozin (INVOKANA) 300 MG TABS tablet Take 1 tablet by mouth daily.     cetirizine (ZYRTEC) 10 MG tablet Take 10 mg by mouth daily.     ciclopirox (LOPROX) 0.77 % cream Apply topically.     clopidogrel (PLAVIX) 75 MG tablet Take 1 tablet (75 mg total) by mouth daily. TAKE 1 TABLET BY MOUTH ONCE A DAY WITH BREAKFAST 90 tablet 3   docusate sodium (COLACE) 100 MG capsule Take 100 mg by mouth 2 (two) times daily.     famotidine (PEPCID) 20 MG tablet Take 20 mg by mouth 2 (two) times daily.     FREESTYLE TEST STRIPS test strip 1 each by Other route every morning.      gabapentin (NEURONTIN) 300 MG capsule Take 300 mg by mouth daily.     hydrochlorothiazide (HYDRODIURIL) 25 MG tablet Take 1 tablet by mouth every morning.     irbesartan (AVAPRO) 300 MG tablet Take 300 mg by mouth at bedtime.      isosorbide mononitrate (IMDUR) 30 MG 24 hr tablet Take 1 tablet by mouth once daily 90 tablet 3   metFORMIN (GLUCOPHAGE-XR) 500 MG 24 hr tablet Take 1 tablet (500 mg total) by mouth 2 (two) times daily.     mupirocin ointment (BACTROBAN) 2 % Apply to affected area three times a day as needed     Naftifine HCl 2 % CREA APPLY TO AFFECTED AREA(S) TWICE A DAY FOR YEAST     nitroGLYCERIN (NITROSTAT) 0.4 MG SL tablet DISSOLVE 1 TABLET UNDER TONGUE AS NEEDEDFOR CHEST PAIN. MAY REPEAT 5 MINUTES APART 3 TIMES IF NEEDED 25 tablet 3   NONFORMULARY OR COMPOUNDED ITEM Apply 1 application topically See admin instructions. Antifungal solution: Terbinafine 3%, Fluconazole 2%, Tea Tree Oil 5%, Urea 10%, Ibuprofen 2% in DMSO suspension: Apply to the toenails daily as directed (Patient not taking: Reported on 10/23/2020)     pantoprazole (PROTONIX) 40 MG tablet Take 1 tablet by mouth 2 (two) times daily.     polyethylene glycol (MIRALAX / GLYCOLAX) 17 g packet Take 17 g by mouth daily.     promethazine (PHENERGAN)  12.5 MG tablet 1 tablet as needed     ranolazine (RANEXA) 500 MG 12 hr  tablet TAKE ONE TABLET BY MOUTH TWICE DAILY 180 tablet 3   TRESIBA FLEXTOUCH 100 UNIT/ML FlexTouch Pen Inject 18 Units into the skin daily.     Vitamin D, Cholecalciferol, 25 MCG (1000 UT) CAPS Take 1 capsule by mouth daily.     No current facility-administered medications for this visit.    Allergies as of 12/01/2020 - Review Complete 12/01/2020  Allergen Reaction Noted   Hydrocodone  02/28/2020   Robaxin [methocarbamol]  02/28/2020   Oxycodone Nausea Only 12/08/2016    Family History  Problem Relation Age of Onset   Colon polyps Mother    Diabetes type II Mother    Heart disease Father    Heart attack Father    Heart disease Brother    Coronary artery disease Cousin    Colon cancer Neg Hx    Pancreatic cancer Neg Hx    Esophageal cancer Neg Hx    Stomach cancer Neg Hx    Liver disease Neg Hx    Rectal cancer Neg Hx     Social History   Socioeconomic History   Marital status: Married    Spouse name: Not on file   Number of children: Not on file   Years of education: Not on file   Highest education level: Not on file  Occupational History   Not on file  Tobacco Use   Smoking status: Never    Passive exposure: Never   Smokeless tobacco: Never  Vaping Use   Vaping Use: Never used  Substance and Sexual Activity   Alcohol use: No   Drug use: No   Sexual activity: Yes  Other Topics Concern   Not on file  Social History Narrative   Not on file   Social Determinants of Health   Financial Resource Strain: Not on file  Food Insecurity: Not on file  Transportation Needs: Not on file  Physical Activity: Not on file  Stress: Not on file  Social Connections: Not on file  Intimate Partner Violence: Not on file    Review of Systems:    Constitutional: No weight loss, fever or chills Cardiovascular: No chest pain Respiratory: No SOB  Gastrointestinal: See HPI and otherwise negative    Physical Exam:  Vital signs: BP 110/64 (BP Location: Left Arm, Patient Position: Sitting, Cuff Size: Normal)   Pulse 64   Ht 5' 7.25" (1.708 m) Comment: height measured without shoes  Wt 198 lb (89.8 kg)   BMI 30.78 kg/m    Constitutional:   Pleasant Caucasian male appears to be in NAD, Well developed, Well nourished, alert and cooperative Respiratory: Respirations even and unlabored. Lungs clear to auscultation bilaterally.   No wheezes, crackles, or rhonchi.  Cardiovascular: Normal S1, S2. No MRG. Regular rate and rhythm. No peripheral edema, cyanosis or pallor.  Gastrointestinal:  Soft, nondistended, mild epigastric TTP, no rebound or guarding. Normal bowel sounds. No appreciable masses or hepatomegaly. Rectal:  Not performed.  Psychiatric: Demonstrates good judgement and reason without abnormal affect or behaviors.  RELEVANT LABS AND IMAGING: CBC    Component Value Date/Time   WBC 4.0 07/26/2019 0052   RBC 4.71 07/26/2019 0052   HGB 14.2 07/26/2019 0052   HGB 10.5 (L) 11/28/2016 1057   HCT 42.4 07/26/2019 0052   HCT 31.7 (L) 11/28/2016 1057   PLT 127 (L) 07/26/2019 0052   PLT 240 11/28/2016 1057   MCV 90.0 07/26/2019 0052   MCV 84 11/28/2016 1057   MCH 30.1 07/26/2019 0052   MCHC  33.5 07/26/2019 0052   RDW 12.7 07/26/2019 0052   RDW 13.9 11/28/2016 1057   LYMPHSABS 1.5 07/25/2019 1704   MONOABS 0.3 07/25/2019 1704   EOSABS 0.0 07/25/2019 1704   BASOSABS 0.0 07/25/2019 1704    CMP     Component Value Date/Time   NA 154 (H) 02/19/2020 0850   K 4.4 02/19/2020 0850   CL 111 (H) 02/19/2020 0850   CO2 18 (L) 02/19/2020 0850   GLUCOSE 109 (H) 02/19/2020 0850   GLUCOSE 148 (H) 07/26/2019 0052   BUN 23 02/19/2020 0850   CREATININE 1.38 (H) 02/19/2020 0850   CREATININE 0.92 07/22/2015 0921   CALCIUM 10.0 02/19/2020 0850   PROT 7.2 02/19/2020 0850   ALBUMIN 4.6 02/19/2020 0850   AST 17 02/19/2020 0850   ALT 24 02/19/2020 0850   ALKPHOS 88 02/19/2020 0850   BILITOT  0.5 02/19/2020 0850   GFRNONAA 50 (L) 02/19/2020 0850   GFRAA 58 (L) 02/19/2020 0850    Assessment: 1.  Hoarseness/dysphagia: EGD with Bravo placement 9/9, results somewhat equivocal given patient had been off of his Pantoprazole for 4 days and was given this at the start of the procedure, likely though he had "good control of reflux", he continues with symptoms now; consider postnasal drainage versus other 2.  History of adenomatous polyps: Previous history of adenomatous polyps in 2019, repeat on 10/23/2020 had poor bowel prep and was incomplete 3.  GERD  Plan: 1.  Continue Pantoprazole 40 mg twice daily.  Increase Pepcid to 40 mg twice a day, every morning and nightly #60 with 5 refills. 2.  Referred patient to ENT for further evaluation of ongoing hoarseness. 3.  Patient is already scheduled for repeat colonoscopy on December 13 with Dr. Bryan Lemma.  Went ahead and ordered a 2-day bowel prep and changed him to [employer] from [employer].  Did provide the patient a detailed list of risks for the procedure and he agrees to proceed. Patient is appropriate for endoscopic procedure(s) in the ambulatory ([place]) setting.  4.  Patient will hold his Plavix for 5 days prior to time of procedure.  Will re-confirm with cardiologist that holding his Plavix for 5 days is acceptable for him. 5.  Did briefly discuss intranasal pH testing and patient declines.  He does not feel like he could be awake for that procedure.  We will see what ENT shows and if the Pepcid increase helps at all.  Otherwise may need to rediscuss. 6.  Patient to follow in clinic as directed after time of procedure  Ellouise Newer, PA-C [facility] 12/01/2020, 10:03 AM  Cc: Crist Infante, MD

## 2020-12-01 NOTE — Patient Instructions (Signed)
Referral placed to [facility] (formerly [facility]) - [phone] [address], [place], [place] [address]  Phone [phone]  We have sent the following medications to your pharmacy for you to pick up at your convenience: Increase Pepcid to 40 mg twice daily.   You have been scheduled for a colonoscopy. Please follow written instructions given to you at your visit today.  Please pick up your prep supplies at the pharmacy within the next 1-3 days. If you use inhalers (even only as needed), please bring them with you on the day of your procedure.  If you are age 20 or older, your body mass index should be between 23-30. Your Body mass index is 30.78 kg/m. If this is out of the aforementioned range listed, please consider follow up with your Primary Care Provider.  If you are age 38 or younger, your body mass index should be between 19-25. Your Body mass index is 30.78 kg/m. If this is out of the aformentioned range listed, please consider follow up with your Primary Care Provider.   __________________________________________________________  The [facility] providers would like to encourage you to use [facility] to communicate with providers for non-urgent requests or questions.  Due to [facility] hold times on the telephone, sending your provider a message by [facility] may be a faster and more efficient way to get a response.  Please allow 48 business hours for a response.  Please remember that this is for non-urgent requests.

## 2020-12-03 NOTE — Progress Notes (Signed)
I appreciate the evaluation and the note.  I have not seen him in person lately as you both have, but it is not entirely clear that reflux explains his throat symptoms.  ENT direct laryngoscopy will be most helpful to see if he has neoplasia or VC dysfunction.   Depending on those findings, perhaps a barium study and UGIS might help to see if there is cricopharyngeal dysfunction and any evidence of GERD.  - HD

## 2020-12-03 NOTE — Progress Notes (Signed)
Agree with the assessment and plan as outlined by Jennifer Lemmon, PA-C. ? ?Penny Frisbie, DO, FACG ? ?

## 2020-12-21 ENCOUNTER — Telehealth: Payer: Self-pay | Admitting: *Deleted

## 2020-12-22 NOTE — Telephone Encounter (Signed)
   Name: Hunter Mcbride.  DOB: [birth year]  MRN: [id number]   Primary Cardiologist: Mertie Moores, MD  Chart reviewed as part of pre-operative protocol coverage. Patient was contacted 12/22/2020 in reference to pre-operative risk assessment for pending surgery as outlined below.  Hunter Mcbride. was last seen on 07/27/2020 by Dr. Acie Fredrickson.  Since that day, Hunter Mcbride. has done well without chest pain or worsening dyspnea.  Therefore, based on ACC/AHA guidelines, the patient would be at acceptable risk for the planned procedure without further cardiovascular testing.   Patient can hold plavix for 5 days prior to the procedure and restart as soon as possible afterward at the surgeon's discretion.    The patient was advised that if he develops new symptoms prior to surgery to contact our office to arrange for a follow-up visit, and he verbalized understanding.  I will route this recommendation to the requesting party via Epic fax function and remove from pre-op pool. Please call with questions.  [place], [place] 12/22/2020, 3:12 PM

## 2020-12-22 NOTE — Telephone Encounter (Signed)
[facility] Pre-operative Risk Assessment     Request for surgical clearance:     Endoscopy Procedure  What type of surgery is being performed?     Colonoscopy  When is this surgery scheduled?     Tuesday 01/26/21  What type of clearance is required ?   Pharmacy  Are there any medications that need to be held prior to surgery and how [place]? Plavix 5 days  Practice name and name of physician performing surgery?      [facility]  What is your office phone and fax number?      Phone- [phone]  [phone]  Anesthesia type (None, local, MAC, general) ?       MAC

## 2020-12-29 NOTE — Telephone Encounter (Signed)
Informed patient to hold Plavix for 5 days prior to procedure. Patient voiced understanding.

## 2020-12-30 DIAGNOSIS — R490 Dysphonia: Secondary | ICD-10-CM | POA: Insufficient documentation

## 2020-12-30 DIAGNOSIS — K219 Gastro-esophageal reflux disease without esophagitis: Secondary | ICD-10-CM | POA: Diagnosis not present

## 2020-12-30 DIAGNOSIS — J3489 Other specified disorders of nose and nasal sinuses: Secondary | ICD-10-CM | POA: Diagnosis not present

## 2020-12-30 DIAGNOSIS — Z955 Presence of coronary angioplasty implant and graft: Secondary | ICD-10-CM | POA: Diagnosis not present

## 2021-01-06 ENCOUNTER — Other Ambulatory Visit: Payer: Self-pay

## 2021-01-06 ENCOUNTER — Encounter: Payer: Self-pay | Admitting: Podiatry

## 2021-01-06 ENCOUNTER — Ambulatory Visit: Payer: PPO | Admitting: Podiatry

## 2021-01-06 DIAGNOSIS — M2042 Other hammer toe(s) (acquired), left foot: Secondary | ICD-10-CM | POA: Diagnosis not present

## 2021-01-06 DIAGNOSIS — R053 Chronic cough: Secondary | ICD-10-CM | POA: Insufficient documentation

## 2021-01-06 DIAGNOSIS — E1142 Type 2 diabetes mellitus with diabetic polyneuropathy: Secondary | ICD-10-CM | POA: Diagnosis not present

## 2021-01-06 DIAGNOSIS — M2012 Hallux valgus (acquired), left foot: Secondary | ICD-10-CM | POA: Diagnosis not present

## 2021-01-06 DIAGNOSIS — M2041 Other hammer toe(s) (acquired), right foot: Secondary | ICD-10-CM

## 2021-01-06 DIAGNOSIS — M79676 Pain in unspecified toe(s): Secondary | ICD-10-CM

## 2021-01-06 DIAGNOSIS — D7589 Other specified diseases of blood and blood-forming organs: Secondary | ICD-10-CM | POA: Insufficient documentation

## 2021-01-06 DIAGNOSIS — E119 Type 2 diabetes mellitus without complications: Secondary | ICD-10-CM | POA: Diagnosis not present

## 2021-01-06 DIAGNOSIS — B351 Tinea unguium: Secondary | ICD-10-CM | POA: Diagnosis not present

## 2021-01-06 DIAGNOSIS — M2011 Hallux valgus (acquired), right foot: Secondary | ICD-10-CM | POA: Diagnosis not present

## 2021-01-09 NOTE — Progress Notes (Signed)
ANNUAL DIABETIC FOOT EXAM  Subjective: Hunter Mcbride. presents today for for annual diabetic foot examination.  Patient relates 5 year h/o diabetes.  Patient denies any h/o foot wounds.  Patient has been diagnosed with neuropathy and it is managed with gabapentin.  Patient's blood sugar was 91 mg/dl today.   Crist Infante, MD is patient's PCP. Last visit was 10/28/2020.  He has been applying Funginail to right great toe.  Past Medical History:  Diagnosis Date   Arthritis    "lower back; right shoulder; left thumb; joints" (06/21/2013)   Asthma    "not sure if this is true or not" (06/21/2013)   CAD (coronary artery disease) 06/2013   s/p PTCA/DES x 3 to mid LAD 2015. b. s/p CABG in 2018. c. cath 12/2017 -> med rx.   Cancer ([place])    GERD (gastroesophageal reflux disease)    Gout    "maybe twice in my life"   Headache(784.0)    "weekly for the last 3-4 months" (06/21/2013)   Hyperlipidemia    Hypertension    Myocardial infarction ([place])    "/Dr. Katharina Caper I've had one between 2014-2015) (06/21/2013)   Osteoarthritis    Sleep apnea    WEIGHT LOSS, NO LONGER NEEDS PER PATIENT   Type II diabetes mellitus ([place])    TYPE 2   Patient Active Problem List   Diagnosis Date Noted   Chronic cough 01/06/2021   Other specified diseases of blood and blood-forming organs 01/06/2021   Hoarseness 12/30/2020   Laryngopharyngeal reflux 12/30/2020   Strain of muscle, fascia and tendon of lower back, initial encounter 09/21/2020   Spasm of back muscles 09/21/2020   Flatulence, eructation and gas pain 03/06/2020   S/P cardiac cath stable exam of coronary arteries 07/26/2019   Wound infection 09/04/2018   S/P lumbar fusion 07/26/2018   Pain in limb 07/06/2018   Left foot drop 07/06/2018   Disorder of the skin and subcutaneous tissue, unspecified 04/27/2018   Degeneration of lumbar intervertebral disc 07/19/2017   Lumbar radiculopathy 07/19/2017   Iron deficiency 07/13/2017   History of total  hip replacement, right 02/23/2017   Coronary artery disease 11/07/2016   Angina pectoris ([place]) 10/29/2016   GERD (gastroesophageal reflux disease) 10/28/2016   Coronary artery disease due to calcified coronary lesion    Hyperlipidemia LDL goal <70    Overweight (BMI 25.0-29.9) 04/13/2016   S/P right THA, AA 04/12/2016   Right hip pain 11/06/2015   Dysphagia 09/08/2015   Injury 07/27/2015   Benign neoplasm of colon 04/23/2015   Encounter for general adult medical examination without abnormal findings 04/16/2015   Acute bronchitis 03/06/2015   Angina at rest [facility]) 02/25/2015   Type 2 diabetes mellitus treated without insulin ([place])    Coronary atherosclerosis of native coronary artery 06/27/2013   Unstable angina (HCC) 06/21/2013   Chest pain 06/20/2013   Testicular hypofunction 05/28/2013   Fatigue 05/27/2013   Cellulitis 10/17/2012   Shoulder arthritis 03/02/2012   Localized, primary osteoarthritis of shoulder region 03/02/2012   Hearing loss 12/01/2011   Olecranon bursitis, right elbow 10/19/2011   Reduced libido 04/15/2011   Shoulder joint pain 04/15/2011   Hypertension 04/11/2011   Hyperlipidemia 04/11/2011   Diabetes mellitus ([place]) 04/11/2011   Chest tightness 10/13/2010   Osteoarthritis 01/26/2009   Anxiety disorder 01/12/2009   Asthma without status asthmaticus 01/12/2009   Carpal tunnel syndrome 01/12/2009   Intervertebral disc disorder 01/12/2009   Male erectile disorder 01/12/2009   Sleep apnea  01/12/2009   Past Surgical History:  Procedure Laterality Date   CARDIAC CATHETERIZATION  1980's   "once"   CARDIAC CATHETERIZATION N/A 07/23/2015   Procedure: Left Heart Cath and Coronary Angiography;  Surgeon: Sherren Mocha, MD;  Location: [place] [facility];  Service: Cardiovascular;  Laterality: N/A;   CARDIOVASCULAR STRESS TEST  06/10/2008   EF 68%   CARPAL TUNNEL RELEASE Bilateral    COLONOSCOPY     CORONARY ANGIOPLASTY WITH STENT PLACEMENT  06/21/2013   "3"    CORONARY ARTERY BYPASS GRAFT N/A 11/07/2016   Procedure: CORONARY ARTERY BYPASS GRAFTING (CABG) x 5 (LIMA to DISTAL LAD, SVG to DIAGONAL, SVG to CIRCUMFLEX, and SVG SEQUENTIALLY to PLB and DISTAL PDA) with EVH of the RIGHT GREATER SAPHENOUS VEIN and LEFT INTERNAL MAMMARY ARTERY HARVEST;  Surgeon: Grace Isaac, MD;  Location: [place];  Service: Open Heart Surgery;  Laterality: N/A;   CORONARY STENT INTERVENTION N/A 06/07/2016   Procedure: Coronary Stent Intervention;  Surgeon: Burnell Blanks, MD;  Location: [place] [facility];  Service: Cardiovascular;  Laterality: N/A;   EXCISION MORTON'S NEUROMA Left    EYE SURGERY Left    "removed film over my eye"   FRACTIONAL FLOW RESERVE WIRE N/A 06/26/2013   Procedure: [place];  Surgeon: [name], MD;  Location: [place] [facility];  Service: Cardiovascular;  Laterality: N/A;   HERNIA REPAIR Left    INTRAVASCULAR PRESSURE WIRE/FFR STUDY N/A 10/28/2016   Procedure: INTRAVASCULAR PRESSURE WIRE/FFR STUDY;  Surgeon: Leonie Man, MD;  Location: [place] [facility];  Service: Cardiovascular;  Laterality: N/A;   JOINT REPLACEMENT Left 03/2013   "thumb"   LEFT HEART CATH AND CORONARY ANGIOGRAPHY N/A 06/07/2016   Procedure: Left Heart Cath and Coronary Angiography;  Surgeon: Burnell Blanks, MD;  Location: [place] [facility];  Service: Cardiovascular;  Laterality: N/A;   LEFT HEART CATH AND CORONARY ANGIOGRAPHY N/A 10/28/2016   Procedure: LEFT HEART CATH AND CORONARY ANGIOGRAPHY;  Surgeon: Leonie Man, MD;  Location: [place];  Service: Cardiovascular;  Laterality: N/A;   LEFT HEART CATH AND CORS/GRAFTS ANGIOGRAPHY N/A 12/25/2017   Procedure: LEFT HEART CATH AND CORS/GRAFTS ANGIOGRAPHY;  Surgeon: Sherren Mocha, MD;  Location: [place] [facility];  Service: Cardiovascular;  Laterality: N/A;   LEFT HEART CATH AND CORS/GRAFTS ANGIOGRAPHY N/A 07/26/2019   Procedure: LEFT HEART CATH AND CORS/GRAFTS ANGIOGRAPHY;   Surgeon: [name], MD;  Location: [place] [facility];  Service: Cardiovascular;  Laterality: N/A;   LEFT HEART CATHETERIZATION WITH CORONARY ANGIOGRAM N/A 06/21/2013   Procedure: LEFT HEART CATHETERIZATION WITH CORONARY ANGIOGRAM;  Surgeon: Burnell Blanks, MD;  Location: [facility];  Service: Cardiovascular;  Laterality: N/A;   LEFT HEART CATHETERIZATION WITH CORONARY ANGIOGRAM N/A 06/26/2013   Procedure: LEFT HEART CATHETERIZATION WITH CORONARY ANGIOGRAM;  Surgeon: [name], MD;  Location: [place] [facility];  Service: Cardiovascular;  Laterality: N/A;   LUMBAR LAMINECTOMY/DECOMPRESSION MICRODISCECTOMY Left 05/30/2018   Procedure: Left  Gill L5 decompression;  Surgeon: Melina Schools, MD;  Location: [facility] [place];  Service: Orthopedics;  Laterality: Left;  192min   POLYPECTOMY     TEE WITHOUT CARDIOVERSION N/A 11/07/2016   Procedure: TRANSESOPHAGEAL ECHOCARDIOGRAM (TEE);  Surgeon: Grace Isaac, MD;  Location: [place];  Service: Open Heart Surgery;  Laterality: N/A;   TONSILLECTOMY     TOTAL HIP ARTHROPLASTY Right 04/12/2016   Procedure: RIGHT TOTAL HIP ARTHROPLASTY ANTERIOR APPROACH;  Surgeon: Paralee Cancel, MD;  Location: [facility];  Service: Orthopedics;  Laterality: Right;  requests 70 mins   TOTAL SHOULDER ARTHROPLASTY  03/01/2012   Procedure: TOTAL SHOULDER ARTHROPLASTY;  Surgeon: Marin Shutter, MD;  Location: [place];  Service: Orthopedics;  Laterality: Right;   UMBILICAL HERNIA REPAIR     Current Outpatient Medications on File Prior to Visit  Medication Sig Dispense Refill   acetaminophen (TYLENOL) 325 MG tablet Take 325 mg by mouth as needed.     ALPRAZolam (XANAX) 0.5 MG tablet TAKE ONE TABLET BY MOUTH UP TO TWICE A DAY AS NEEDED FOR ANXIETY     amLODipine (NORVASC) 5 MG tablet Take 1 tablet (5 mg total) by mouth 2 (two) times daily. 180 tablet 2   aspirin EC 81 MG tablet Take 81 mg by mouth at bedtime.      atenolol (TENORMIN) 50 MG tablet TAKE ONE TABLET BY MOUTH ONE  TIME DAILY at suppertime     atorvastatin (LIPITOR) 80 MG tablet Take 1 tablet by mouth daily.     Bioflavonoid Products (SUPER C-500) TABS Take 1 tablet by mouth daily.     calcium carbonate (TUMS - DOSED IN MG ELEMENTAL CALCIUM) 500 MG chewable tablet Chew 2 tablets by mouth as needed for indigestion or heartburn.      canagliflozin (INVOKANA) 300 MG TABS tablet Take 1 tablet by mouth daily.     cetirizine (ZYRTEC) 10 MG tablet Take 10 mg by mouth daily.     ciclopirox (LOPROX) 0.77 % cream Apply topically.     clopidogrel (PLAVIX) 75 MG tablet Take 1 tablet (75 mg total) by mouth daily. TAKE 1 TABLET BY MOUTH ONCE A DAY WITH BREAKFAST 90 tablet 3   docusate sodium (COLACE) 100 MG capsule Take 100 mg by mouth 2 (two) times daily.     famotidine (PEPCID) 40 MG tablet Take 1 tablet (40 mg total) by mouth 2 (two) times daily. 60 tablet 5   FREESTYLE TEST STRIPS test strip 1 each by Other route every morning.      gabapentin (NEURONTIN) 300 MG capsule Take 300 mg by mouth daily.     hydrochlorothiazide (HYDRODIURIL) 25 MG tablet Take 1 tablet by mouth every morning.     irbesartan (AVAPRO) 300 MG tablet Take 300 mg by mouth at bedtime.      isosorbide mononitrate (IMDUR) 30 MG 24 hr tablet Take 1 tablet by mouth once daily 90 tablet 3   metFORMIN (GLUCOPHAGE-XR) 500 MG 24 hr tablet Take 1 tablet (500 mg total) by mouth 2 (two) times daily.     mupirocin ointment (BACTROBAN) 2 % Apply to affected area three times a day as needed     Naftifine HCl 2 % CREA APPLY TO AFFECTED AREA(S) TWICE A DAY FOR YEAST     nitroGLYCERIN (NITROSTAT) 0.4 MG SL tablet DISSOLVE 1 TABLET UNDER TONGUE AS NEEDEDFOR CHEST PAIN. MAY REPEAT 5 MINUTES APART 3 TIMES IF NEEDED (Patient not taking: Reported on 12/01/2020) 25 tablet 3   NONFORMULARY OR COMPOUNDED ITEM Apply 1 application topically See admin instructions. Antifungal solution: Terbinafine 3%, Fluconazole 2%, Tea Tree Oil 5%, Urea 10%, Ibuprofen 2% in DMSO suspension:  Apply to the toenails daily as directed     pantoprazole (PROTONIX) 40 MG tablet Take 1 tablet by mouth 2 (two) times daily.     polyethylene glycol (MIRALAX / GLYCOLAX) 17 g packet Take 17 g by mouth daily.     promethazine (PHENERGAN) 12.5 MG tablet 1 tablet as needed  ranolazine (RANEXA) 500 MG 12 hr tablet TAKE ONE TABLET BY MOUTH TWICE DAILY 180 tablet 3   TRESIBA FLEXTOUCH 100 UNIT/ML FlexTouch Pen Inject 18 Units into the skin daily.     triamcinolone cream (KENALOG) 0.1 % Apply 1 application topically as needed.     Vitamin D, Cholecalciferol, 25 MCG (1000 UT) CAPS Take 1 capsule by mouth daily.     No current facility-administered medications on file prior to visit.    Allergies  Allergen Reactions   Hydrocodone Nausea Only    Other reaction(s): nauseated Other reaction(s): nauseated Other reaction(s): nauseated   Methocarbamol Nausea Only    Other reaction(s): nauseated Other reaction(s): nauseated Other reaction(s): nauseated   Oxycodone Nausea Only   Social History   Occupational History   Not on file  Tobacco Use   Smoking status: Never    Passive exposure: Never   Smokeless tobacco: Never  Vaping Use   Vaping Use: Never used  Substance and Sexual Activity   Alcohol use: No   Drug use: No   Sexual activity: Yes   Family History  Problem Relation Age of Onset   Colon polyps Mother    Diabetes type II Mother    Heart disease Father    Heart attack Father    Heart disease Brother    Coronary artery disease Cousin    Colon cancer Neg Hx    Pancreatic cancer Neg Hx    Esophageal cancer Neg Hx    Stomach cancer Neg Hx    Liver disease Neg Hx    Rectal cancer Neg Hx    Immunization History  Administered Date(s) Administered   Influenza, High Dose Seasonal PF 11/12/2016   Influenza,inj,quad, With Preservative 11/14/2016   Pneumococcal-Unspecified 01/14/2013   Zoster Recombinat (Shingrix) 12/19/2016     Review of Systems: Negative except as noted in  the HPI.   Objective: There were no vitals filed for this visit.  Hunter Mcbride. is a pleasant 75 y.o. male in NAD. AAO X 3.  Vascular Examination: CFT immediate b/l LE. Palpable DP/PT pulses b/l LE. Digital hair present b/l. Skin temperature gradient WNL b/l. No pain with calf compression b/l. No edema noted b/l. No cyanosis or clubbing noted b/l LE.  Dermatological Examination: Pedal integument with normal turgor, texture and tone b/l LE. No open wounds b/l. No interdigital macerations b/l. Toenails 1-5 b/l elongated, thickened, discolored with subungual debris. Right great toenail is the most involved. +Tenderness with dorsal palpation of nailplates. No hyperkeratotic or porokeratotic lesions present.  Musculoskeletal Examination: Normal muscle strength 5/5 to all lower extremity muscle groups bilaterally. HAV with bunion deformity noted b/l LE. Hammertoe deformity noted 2-5 b/l.[name] No pain, crepitus or joint limitation noted with ROM b/l LE.  Patient ambulates independently without assistive aids.  Footwear Assessment: Does the patient wear appropriate shoes? Yes. Does the patient need inserts/orthotics? No.  Neurological Examination: Pt has subjective symptoms of neuropathy. Protective sensation intact 5/5 intact bilaterally with 10g monofilament b/l. Vibratory sensation intact b/l.  Assessment: 1. Pain due to onychomycosis of toenail   2. Diabetic peripheral neuropathy associated with type 2 diabetes mellitus (HCC)   3. Hallux valgus, acquired, bilateral   4. Acquired hammertoes of both feet   5. Encounter for diabetic foot exam ([place])     ADA Risk Categorization: Low Risk :  Patient has all of the following: Intact protective sensation No prior foot ulcer  No severe deformity Pedal pulses present  Plan: -He will  continue his current bottle of Funginail and we will revisit at next appointment. Apply to all toenails once daily. -Diabetic foot examination performed  today. -Continue foot and shoe inspections daily. Monitor blood glucose per PCP/Endocrinologist's recommendations. -Mycotic toenails 1-5 bilaterally were debrided in length and girth with sterile nail nippers and dremel without incident. -Patient/POA to call should there be question/concern in the interim.  Return in about 3 months (around 04/08/2021).  Marzetta Board, DPM

## 2021-01-26 ENCOUNTER — Other Ambulatory Visit: Payer: Self-pay

## 2021-01-26 ENCOUNTER — Ambulatory Visit (AMBULATORY_SURGERY_CENTER): Payer: PPO | Admitting: Gastroenterology

## 2021-01-26 ENCOUNTER — Encounter: Payer: Self-pay | Admitting: Gastroenterology

## 2021-01-26 VITALS — BP 97/63 | HR 51 | Temp 98.0°F | Resp 16 | Ht 67.0 in | Wt 198.0 lb

## 2021-01-26 DIAGNOSIS — D125 Benign neoplasm of sigmoid colon: Secondary | ICD-10-CM

## 2021-01-26 DIAGNOSIS — K5730 Diverticulosis of large intestine without perforation or abscess without bleeding: Secondary | ICD-10-CM | POA: Diagnosis not present

## 2021-01-26 DIAGNOSIS — K641 Second degree hemorrhoids: Secondary | ICD-10-CM | POA: Diagnosis not present

## 2021-01-26 DIAGNOSIS — D122 Benign neoplasm of ascending colon: Secondary | ICD-10-CM

## 2021-01-26 DIAGNOSIS — Z86010 Personal history of colonic polyps: Secondary | ICD-10-CM

## 2021-01-26 DIAGNOSIS — D120 Benign neoplasm of cecum: Secondary | ICD-10-CM

## 2021-01-26 MED ORDER — SODIUM CHLORIDE 0.9 % IV SOLN: 500.0000 mL | Freq: Once | INTRAVENOUS | Status: DC

## 2021-01-26 NOTE — Progress Notes (Signed)
VS by [place]. 

## 2021-01-26 NOTE — Patient Instructions (Signed)
Thank you for allowing [place] to care for you today! Resume your normal daily activities in 24 hours. Resume previous diet and medications today. Restart Plavix at prior dose tomorrow, 01/27/21. Repeat colonoscopy based on pathology results.  These results will be available in 7-10 days.   YOU HAD AN ENDOSCOPIC PROCEDURE TODAY AT [facility]:   Refer to the procedure report that was given to you for any specific questions about what was found during the examination.  If the procedure report does not answer your questions, please call your gastroenterologist to clarify.  If you requested that your care partner not be given the details of your procedure findings, then the procedure report has been included in a sealed envelope for you to review at your convenience later.  YOU SHOULD EXPECT: Some feelings of bloating in the abdomen. Passage of more gas than usual.  Walking can help get rid of the air that was put into your GI tract during the procedure and reduce the bloating. If you had a lower endoscopy (such as a colonoscopy or flexible sigmoidoscopy) you may notice spotting of blood in your stool or on the toilet paper. If you underwent a bowel prep for your procedure, you may not have a normal bowel movement for a few days.  Please Note:  You might notice some irritation and congestion in your nose or some drainage.  This is from the oxygen used during your procedure.  There is no need for concern and it should clear up in a day or so.  SYMPTOMS TO REPORT IMMEDIATELY:  Following lower endoscopy (colonoscopy or flexible sigmoidoscopy):  Excessive amounts of blood in the stool  Significant tenderness or worsening of abdominal pains  Swelling of the abdomen that is new, acute  Fever of 100F or higher    For urgent or emergent issues, a gastroenterologist can be reached at any hour by calling [phone]. Do not use MyChart messaging for urgent concerns.    DIET:  We do  recommend a small meal at first, but then you may proceed to your regular diet.  Drink plenty of fluids but you should avoid alcoholic beverages for 24 hours.  ACTIVITY:  You should plan to take it easy for the rest of today and you should NOT DRIVE or use heavy machinery until tomorrow (because of the sedation medicines used during the test).    FOLLOW UP: Our staff will call the number listed on your records 48-72 hours following your procedure to check on you and address any questions or concerns that you may have regarding the information given to you following your procedure. If we do not reach you, we will leave a message.  We will attempt to reach you two times.  During this call, we will ask if you have developed any symptoms of COVID 19. If you develop any symptoms (ie: fever, flu-like symptoms, shortness of breath, cough etc.) before then, please call [phone].  If you test positive for Covid 19 in the 2 weeks post procedure, please call and report this information to [place].    If any biopsies were taken you will be contacted by phone or by letter within the next 1-3 weeks.  Please call us at [phone] if you have not heard about the biopsies in 3 weeks.    SIGNATURES/CONFIDENTIALITY: You and/or your care partner have signed paperwork which will be entered into your electronic medical record.  These signatures attest to the fact that that  the information above on your After Visit Summary has been reviewed and is understood.  Full responsibility of the confidentiality of this discharge information lies with you and/or your care-partner.

## 2021-01-26 NOTE — Op Note (Signed)
[name] Patient Name: Hunter Mcbride Procedure Date: 01/26/2021 8:04 AM MRN: [id number] Endoscopist: Gerrit Heck , MD Age: 75 Referring MD:  Date of Birth: January 09, 1946 Gender: Male Account #: [id number] Procedure:                Colonoscopy Indications:              High risk colon cancer surveillance: Personal                            history of adenomatous polyps                           ?" 09/14/2017 colonoscopy due for heme positive stool                            no low ferritin with diverticulosis in the left and                            right colon, 4 3-6 mm polyps in the transverse and                            ascending colon and otherwise normal. Pathology                            showed tubular adenomas. Repeat recommended in 3                            years.                           ?"10/23/2020 colonoscopy with poor preparation of the                            colon, hemorrhoids, stool in the entire colon,                            diverticulosis in the sigmoid and ascending colon,                            external and internal hemorrhoids. Repeat                            recommended in the next 3 months for screening                            given poor prep. Medicines:                Monitored Anesthesia Care Procedure:                Pre-Anesthesia Assessment:                           - Prior to the procedure, a History and Physical  was performed, and patient medications and                            allergies were reviewed. The patient's tolerance of                            previous anesthesia was also reviewed. The risks                            and benefits of the procedure and the sedation                            options and risks were discussed with the patient.                            All questions were answered, and informed consent                            was obtained. Prior  Anticoagulants: The patient has                            taken Plavix (clopidogrel), last dose was 6 days                            prior to procedure. ASA Grade Assessment: III - A                            patient with severe systemic disease. After                            reviewing the risks and benefits, the patient was                            deemed in satisfactory condition to undergo the                            procedure.                           After obtaining informed consent, the colonoscope                            was passed under direct vision. Throughout the                            procedure, the patient's blood pressure, pulse, and                            oxygen saturations were monitored continuously. The                            CF [id number] #[id number] was introduced through the anus  and advanced to the the cecum, identified by                            appendiceal orifice and ileocecal valve. The                            colonoscopy was performed without difficulty. The                            patient tolerated the procedure well. The quality                            of the bowel preparation was good. The ileocecal                            valve, appendiceal orifice, and rectum were                            photographed. Scope In: 8:08:00 AM Scope Out: 8:31:05 AM Scope Withdrawal Time: 0 hours 20 minutes 10 seconds  Total Procedure Duration: 0 hours 23 minutes 5 seconds  Findings:                 The perianal and digital rectal examinations were                            normal.                           Three sessile polyps were found in the ascending                            colon and cecum. The polyps were 2 to 4 mm in size.                            These polyps were removed with a cold snare.                            Resection and retrieval were complete. Estimated                            blood  loss was minimal.                           A 5 mm polyp was found in the sigmoid colon. The                            polyp was sessile. The polyp was removed with a                            cold snare. Resection and retrieval were complete.                            Estimated blood loss was minimal.  Multiple small and large-mouthed diverticula were                            found in the sigmoid colon, descending colon and                            ascending colon.                           Non-bleeding internal hemorrhoids were found during                            retroflexion. The hemorrhoids were small and Grade                            II (internal hemorrhoids that prolapse but reduce                            spontaneously). Complications:            No immediate complications. Estimated Blood Loss:     Estimated blood loss was minimal. Impression:               - Three 2 to 4 mm polyps in the ascending colon and                            in the cecum, removed with a cold snare. Resected                            and retrieved.                           - One 5 mm polyp in the sigmoid colon, removed with                            a cold snare. Resected and retrieved.                           - Diverticulosis in the sigmoid colon, in the                            descending colon and in the ascending colon.                           - Non-bleeding internal hemorrhoids. Recommendation:           - Patient has a contact number available for                            emergencies. The signs and symptoms of potential                            delayed complications were discussed with the                            patient. Return  to normal activities tomorrow.                            Written discharge instructions were provided to the                            patient.                           - Resume previous diet.                            - Continue present medications.                           - Await pathology results.                           - Repeat colonoscopy for surveillance based on                            pathology results.                           - Return to GI clinic PRN. Gerrit Heck, MD 01/26/2021 8:38:03 AM

## 2021-01-26 NOTE — Progress Notes (Signed)
Called to room to assist during endoscopic procedure.  Patient ID and intended procedure confirmed with present staff. Received instructions for my participation in the procedure from the performing physician.  

## 2021-01-26 NOTE — Progress Notes (Signed)
GASTROENTEROLOGY PROCEDURE H&P NOTE   Primary Care Physician: Crist Infante, MD    Reason for Procedure:  Colon cancer screening, polyp surveillance  Plan:    Colonoscopy  Patient is appropriate for endoscopic procedure(s) in the ambulatory ([place]) setting.  The nature of the procedure, as well as the risks, benefits, and alternatives were carefully and thoroughly reviewed with the patient. Ample time for discussion and questions allowed. The patient understood, was satisfied, and agreed to proceed.     HPI: Hunter Mcbride. is a 75 y.o. male who presents for colonoscopy for ongoing polyp surveillance.  Last colonoscopy was 10/2020 with poor bowel prep with recommendation for short interval repeat.  Endoscopic History: - 09/14/2017 colonoscopy due for heme positive stool no low ferritin with diverticulosis in the left and right colon, 4 3-6 mm polyps in the transverse and ascending colon and otherwise normal.  Pathology showed tubular adenomas.  Repeat recommended in 3 years. - 09/14/2017 EGD was normal.   Pathology showed reactive gastropathy.    - 10/23/2020 EGD with Bravo was Z-line regular 40 cm from incisors, erythematous mucosa in the gastric fundus, body and antrum.  Bravo with evidence of significant reflux disease.  Discussed that results should be taken with a grain of salt given that he was holding his PPI for 4 days prior to the study.  It was noted that likely patient was well controlled on medicine.  He was discussed that if patient continued with symptoms could consider transnasal pH/impedance testing on acid suppression. -10/23/2020 colonoscopy with poor preparation of the colon, hemorrhoids, stool in the entire colon, diverticulosis in the sigmoid and ascending colon, external and internal hemorrhoids.  Repeat recommended in the next 3 months for screening given poor prep.   Past Medical History:  Diagnosis Date   Arthritis    "lower back; right shoulder; left thumb; joints"  (06/21/2013)   Asthma    "not sure if this is true or not" (06/21/2013)   CAD (coronary artery disease) 06/2013   s/p PTCA/DES x 3 to mid LAD 2015. b. s/p CABG in 2018. c. cath 12/2017 -> med rx.   Cancer ([place])    Clotting disorder ([place])    GERD (gastroesophageal reflux disease)    Gout    "maybe twice in my life"   Headache(784.0)    "weekly for the last 3-4 months" (06/21/2013)   Hyperlipidemia    Hypertension    Myocardial infarction ([place])    "/Dr. Katharina Caper I've had one between 2014-2015) (06/21/2013)   Neuromuscular disorder ([place])    Osteoarthritis    Sleep apnea    WEIGHT LOSS, NO LONGER NEEDS PER PATIENT   Type II diabetes mellitus ([place])    TYPE 2    Past Surgical History:  Procedure Laterality Date   CARDIAC CATHETERIZATION  1980's   "once"   CARDIAC CATHETERIZATION N/A 07/23/2015   Procedure: Left Heart Cath and Coronary Angiography;  Surgeon: Sherren Mocha, MD;  Location: [place] [facility];  Service: Cardiovascular;  Laterality: N/A;   CARDIOVASCULAR STRESS TEST  06/10/2008   EF 68%   CARPAL TUNNEL RELEASE Bilateral    COLONOSCOPY     CORONARY ANGIOPLASTY WITH STENT PLACEMENT  06/21/2013   "3"   CORONARY ARTERY BYPASS GRAFT N/A 11/07/2016   Procedure: CORONARY ARTERY BYPASS GRAFTING (CABG) x 5 (LIMA to DISTAL LAD, SVG to DIAGONAL, SVG to CIRCUMFLEX, and SVG SEQUENTIALLY to PLB and DISTAL PDA) with EVH of the RIGHT GREATER SAPHENOUS VEIN and  LEFT INTERNAL MAMMARY ARTERY HARVEST;  Surgeon: Grace Isaac, MD;  Location: [facility];  Service: Open Heart Surgery;  Laterality: N/A;   CORONARY STENT INTERVENTION N/A 06/07/2016   Procedure: Coronary Stent Intervention;  Surgeon: Burnell Blanks, MD;  Location: [place] [facility];  Service: Cardiovascular;  Laterality: N/A;   EXCISION MORTON'S NEUROMA Left    EYE SURGERY Left    "removed film over my eye"   FRACTIONAL FLOW RESERVE WIRE N/A 06/26/2013   Procedure: [place];  Surgeon: [name], MD;   Location: [facility];  Service: Cardiovascular;  Laterality: N/A;   HERNIA REPAIR Left    INTRAVASCULAR PRESSURE WIRE/FFR STUDY N/A 10/28/2016   Procedure: INTRAVASCULAR PRESSURE WIRE/FFR STUDY;  Surgeon: Leonie Man, MD;  Location: [facility];  Service: Cardiovascular;  Laterality: N/A;   JOINT REPLACEMENT Left 03/2013   "thumb"   LEFT HEART CATH AND CORONARY ANGIOGRAPHY N/A 06/07/2016   Procedure: Left Heart Cath and Coronary Angiography;  Surgeon: Burnell Blanks, MD;  Location: [place] [facility];  Service: Cardiovascular;  Laterality: N/A;   LEFT HEART CATH AND CORONARY ANGIOGRAPHY N/A 10/28/2016   Procedure: LEFT HEART CATH AND CORONARY ANGIOGRAPHY;  Surgeon: Leonie Man, MD;  Location: [place] [facility];  Service: Cardiovascular;  Laterality: N/A;   LEFT HEART CATH AND CORS/GRAFTS ANGIOGRAPHY N/A 12/25/2017   Procedure: LEFT HEART CATH AND CORS/GRAFTS ANGIOGRAPHY;  Surgeon: Sherren Mocha, MD;  Location: [place] [facility];  Service: Cardiovascular;  Laterality: N/A;   LEFT HEART CATH AND CORS/GRAFTS ANGIOGRAPHY N/A 07/26/2019   Procedure: LEFT HEART CATH AND CORS/GRAFTS ANGIOGRAPHY;  Surgeon: [name], MD;  Location: [place] [facility];  Service: Cardiovascular;  Laterality: N/A;   LEFT HEART CATHETERIZATION WITH CORONARY ANGIOGRAM N/A 06/21/2013   Procedure: LEFT HEART CATHETERIZATION WITH CORONARY ANGIOGRAM;  Surgeon: Burnell Blanks, MD;  Location: [facility];  Service: Cardiovascular;  Laterality: N/A;   LEFT HEART CATHETERIZATION WITH CORONARY ANGIOGRAM N/A 06/26/2013   Procedure: LEFT HEART CATHETERIZATION WITH CORONARY ANGIOGRAM;  Surgeon: [name], MD;  Location: [facility] C[facility];  Service: Cardiovascular;  Laterality: N/A;   LUMBAR LAMINECTOMY/DECOMPRESSION MICRODISCECTOMY Left 05/30/2018   Procedure: Left  Gill L5 decompression;  Surgeon: Melina Schools, MD;  Location: [facility];  Service: Orthopedics;  Laterality: Left;  115min    POLYPECTOMY     TEE WITHOUT CARDIOVERSION N/A 11/07/2016   Procedure: TRANSESOPHAGEAL ECHOCARDIOGRAM (TEE);  Surgeon: Grace Isaac, MD;  Location: [facility];  Service: Open Heart Surgery;  Laterality: N/A;   TONSILLECTOMY     TOTAL HIP ARTHROPLASTY Right 04/12/2016   Procedure: RIGHT TOTAL HIP ARTHROPLASTY ANTERIOR APPROACH;  Surgeon: Paralee Cancel, MD;  Location: [facility];  Service: Orthopedics;  Laterality: Right;  requests 70 mins   TOTAL SHOULDER ARTHROPLASTY  03/01/2012   Procedure: TOTAL SHOULDER ARTHROPLASTY;  Surgeon: Marin Shutter, MD;  Location: [facility];  Service: Orthopedics;  Laterality: Right;   UMBILICAL HERNIA REPAIR      Prior to Admission medications   Medication Sig Start Date End Date Taking? Authorizing Provider  acetaminophen (TYLENOL) 325 MG tablet Take 325 mg by mouth as needed. 12/14/10  Yes [provider]  ALPRAZolam (XANAX) 0.5 MG tablet TAKE ONE TABLET BY MOUTH UP TO TWICE A DAY AS NEEDED FOR ANXIETY 04/01/20  Yes [provider]  amLODipine (NORVASC) 5 MG tablet Take 1 tablet (5 mg total) by mouth 2 (two) times daily.  06/17/20  Yes Nahser, Wonda Cheng, MD  aspirin EC 81 MG tablet Take 81 mg by mouth at bedtime.    Yes [provider]  atenolol (TENORMIN) 50 MG tablet TAKE ONE TABLET BY MOUTH ONE TIME DAILY at suppertime   Yes [provider]  atorvastatin (LIPITOR) 80 MG tablet Take 1 tablet by mouth daily.   Yes [provider]  Bioflavonoid Products (SUPER C-500) TABS Take 1 tablet by mouth daily.   Yes [provider]  canagliflozin (INVOKANA) 300 MG TABS tablet Take 1 tablet by mouth daily.   Yes [provider]  cetirizine (ZYRTEC) 10 MG tablet Take 10 mg by mouth daily.   Yes [provider]  docusate sodium (COLACE) 100 MG capsule Take 100 mg by mouth 2 (two) times daily.   Yes [provider]  famotidine (PEPCID) 40 MG tablet Take 1 tablet (40 mg total) by mouth 2 (two) times daily.  12/01/20  Yes Levin Erp, PA  FREESTYLE TEST STRIPS test strip 1 each by Other route every morning.  01/23/18  Yes [provider]  gabapentin (NEURONTIN) 300 MG capsule Take 300 mg by mouth daily.   Yes [provider]  hydrochlorothiazide (HYDRODIURIL) 25 MG tablet Take 1 tablet by mouth every morning.   Yes [provider]  irbesartan (AVAPRO) 300 MG tablet Take 300 mg by mouth at bedtime.    Yes [provider]  isosorbide mononitrate (IMDUR) 30 MG 24 hr tablet Take 1 tablet by mouth once daily 08/19/20  Yes Nahser, Wonda Cheng, MD  metFORMIN (GLUCOPHAGE-XR) 500 MG 24 hr tablet Take 1 tablet (500 mg total) by mouth 2 (two) times daily. 07/29/19  Yes Isaiah Serge, NP  pantoprazole (PROTONIX) 40 MG tablet Take 1 tablet by mouth 2 (two) times daily.   Yes [provider]  promethazine (PHENERGAN) 12.5 MG tablet 1 tablet as needed 12/10/19  Yes [provider]  ranolazine (RANEXA) 500 MG 12 hr tablet TAKE ONE TABLET BY MOUTH TWICE DAILY 08/19/20  Yes Nahser, Wonda Cheng, MD  TRESIBA FLEXTOUCH 100 UNIT/ML FlexTouch Pen Inject 18 Units into the skin daily. 02/11/20  Yes [provider]  Vitamin D, Cholecalciferol, 25 MCG (1000 UT) CAPS Take 1 capsule by mouth daily. 01/12/09  Yes [provider]  calcium carbonate (TUMS - DOSED IN MG ELEMENTAL CALCIUM) 500 MG chewable tablet Chew 2 tablets by mouth as needed for indigestion or heartburn.     [provider]  ciclopirox (LOPROX) 0.77 % cream Apply topically. 06/18/20   [provider]  clopidogrel (PLAVIX) 75 MG tablet Take 1 tablet (75 mg total) by mouth daily. TAKE 1 TABLET BY MOUTH ONCE A DAY WITH BREAKFAST 07/27/20   Nahser, Wonda Cheng, MD  mupirocin ointment (BACTROBAN) 2 % Apply to affected area three times a day as needed    [provider]  Naftifine HCl 2 % CREA APPLY TO AFFECTED AREA(S) TWICE A DAY FOR YEAST    [provider]   nitroGLYCERIN (NITROSTAT) 0.4 MG SL tablet DISSOLVE 1 TABLET UNDER TONGUE AS NEEDEDFOR CHEST PAIN. MAY REPEAT 5 MINUTES APART 3 TIMES IF NEEDED Patient not taking: Reported on 12/01/2020 10/02/20   Nahser, Wonda Cheng, MD  NONFORMULARY OR COMPOUNDED ITEM Apply 1 application topically See admin instructions. Antifungal solution: Terbinafine 3%, Fluconazole 2%, Tea Tree Oil 5%, Urea 10%, Ibuprofen 2% in DMSO suspension: Apply to the toenails daily as directed 07/26/19   Isaiah Serge, NP  polyethylene glycol (MIRALAX / GLYCOLAX) 17 g packet Take 17 g by mouth daily.    [provider]  triamcinolone cream (KENALOG) 0.1 % Apply 1 application topically as needed. 10/29/20   [provider]    Current Outpatient Medications  Medication Sig Dispense Refill   acetaminophen (TYLENOL) 325 MG tablet Take 325 mg by mouth as needed.     ALPRAZolam (XANAX) 0.5 MG tablet TAKE ONE TABLET BY MOUTH UP TO TWICE A DAY AS NEEDED FOR ANXIETY     amLODipine (NORVASC) 5 MG tablet Take 1 tablet (5 mg total) by mouth 2 (two) times daily. 180 tablet 2   aspirin EC 81 MG tablet Take 81 mg by mouth at bedtime.      atenolol (TENORMIN) 50 MG tablet TAKE ONE TABLET BY MOUTH ONE TIME DAILY at suppertime     atorvastatin (LIPITOR) 80 MG tablet Take 1 tablet by mouth daily.     Bioflavonoid Products (SUPER C-500) TABS Take 1 tablet by mouth daily.     canagliflozin (INVOKANA) 300 MG TABS tablet Take 1 tablet by mouth daily.     cetirizine (ZYRTEC) 10 MG tablet Take 10 mg by mouth daily.     docusate sodium (COLACE) 100 MG capsule Take 100 mg by mouth 2 (two) times daily.     famotidine (PEPCID) 40 MG tablet Take 1 tablet (40 mg total) by mouth 2 (two) times daily. 60 tablet 5   FREESTYLE TEST STRIPS test strip 1 each by Other route every morning.      gabapentin (NEURONTIN) 300 MG capsule Take 300 mg by mouth daily.     hydrochlorothiazide (HYDRODIURIL) 25 MG tablet Take 1 tablet by mouth every morning.      irbesartan (AVAPRO) 300 MG tablet Take 300 mg by mouth at bedtime.      isosorbide mononitrate (IMDUR) 30 MG 24 hr tablet Take 1 tablet by mouth once daily 90 tablet 3   metFORMIN (GLUCOPHAGE-XR) 500 MG 24 hr tablet Take 1 tablet (500 mg total) by mouth 2 (two) times daily.     pantoprazole (PROTONIX) 40 MG tablet Take 1 tablet by mouth 2 (two) times daily.     promethazine (PHENERGAN) 12.5 MG tablet 1 tablet as needed     ranolazine (RANEXA) 500 MG 12 hr tablet TAKE ONE TABLET BY MOUTH TWICE DAILY 180 tablet 3   TRESIBA FLEXTOUCH 100 UNIT/ML FlexTouch Pen Inject 18 Units into the skin daily.     Vitamin D, Cholecalciferol, 25 MCG (1000 UT) CAPS Take 1 capsule by mouth daily.     calcium carbonate (TUMS - DOSED IN MG ELEMENTAL CALCIUM) 500 MG chewable tablet Chew 2 tablets by mouth as needed for indigestion or heartburn.      ciclopirox (LOPROX) 0.77 % cream Apply topically.     clopidogrel (PLAVIX) 75 MG tablet Take 1 tablet (75 mg total) by mouth daily. TAKE 1 TABLET BY MOUTH ONCE A DAY WITH BREAKFAST 90 tablet 3   mupirocin ointment (BACTROBAN) 2 % Apply to affected area three times a day as needed     Naftifine HCl 2 % CREA APPLY TO AFFECTED AREA(S) TWICE A DAY FOR YEAST     nitroGLYCERIN (NITROSTAT) 0.4 MG SL tablet DISSOLVE 1 TABLET UNDER TONGUE AS NEEDEDFOR CHEST PAIN. MAY REPEAT 5 MINUTES APART 3 TIMES IF NEEDED (Patient not taking: Reported on 12/01/2020) 25 tablet 3   NONFORMULARY OR COMPOUNDED ITEM Apply 1 application topically See admin instructions. Antifungal solution: Terbinafine 3%,  Fluconazole 2%, Tea Tree Oil 5%, Urea 10%, Ibuprofen 2% in DMSO suspension: Apply to the toenails daily as directed     polyethylene glycol (MIRALAX / GLYCOLAX) 17 g packet Take 17 g by mouth daily.     triamcinolone cream (KENALOG) 0.1 % Apply 1 application topically as needed.     Current Facility-Administered Medications  Medication Dose Route Frequency Provider Last Rate Last Admin   0.9 %  sodium  chloride infusion  500 mL Intravenous Once Chais Fehringer V, DO        Allergies as of 01/26/2021 - Review Complete 01/26/2021  Allergen Reaction Noted   Hydrocodone Nausea Only 02/28/2020   Methocarbamol Nausea Only 02/28/2020   Oxycodone Nausea Only 12/08/2016    Family History  Problem Relation Age of Onset   Colon polyps Mother    Diabetes type II Mother    Heart disease Father    Heart attack Father    Heart disease Brother    Coronary artery disease Cousin    Colon cancer Neg Hx    Pancreatic cancer Neg Hx    Esophageal cancer Neg Hx    Stomach cancer Neg Hx    Liver disease Neg Hx    Rectal cancer Neg Hx     Social History   Socioeconomic History   Marital status: Married    Spouse name: Not on file   Number of children: Not on file   Years of education: Not on file   Highest education level: Not on file  Occupational History   Not on file  Tobacco Use   Smoking status: Never    Passive exposure: Never   Smokeless tobacco: Never  Vaping Use   Vaping Use: Never used  Substance and Sexual Activity   Alcohol use: No   Drug use: No   Sexual activity: Yes  Other Topics Concern   Not on file  Social History Narrative   Not on file   Social Determinants of Health   Financial Resource Strain: Not on file  Food Insecurity: Not on file  Transportation Needs: Not on file  Physical Activity: Not on file  Stress: Not on file  Social Connections: Not on file  Intimate Partner Violence: Not on file    Physical Exam: Vital signs in last 24 hours: @BP  (!) 150/50   Pulse 85   Temp 98 F (36.7 C)   Ht 5\' 7"  (1.702 m)   Wt 198 lb (89.8 kg)   SpO2 98%   BMI 31.01 kg/m  GEN: NAD EYE: Sclerae anicteric ENT: MMM CV: Non-tachycardic Pulm: CTA b/l GI: Soft, NT/ND NEURO:  Alert & Oriented x Stewart, DO [facility]   01/26/2021 7:53 AM

## 2021-01-26 NOTE — Progress Notes (Signed)
To PACU, VSS. Report to [email or web address] 

## 2021-01-28 ENCOUNTER — Telehealth: Payer: Self-pay

## 2021-01-28 NOTE — Telephone Encounter (Signed)
°  Follow up Call-  Call back number 01/26/2021 10/23/2020  Post procedure Call Back phone  # [phone]  Permission to leave phone message Yes Yes  Some recent data might be hidden     Patient questions:  Do you have a fever, pain , or abdominal swelling? No. Pain Score  0 *  Have you tolerated food without any problems? Yes.    Have you been able to return to your normal activities? Yes.    Do you have any questions about your discharge instructions: Diet   No. Medications  No. Follow up visit  No.  Do you have questions or concerns about your Care? No.  Actions: * If pain score is 4 or above: No action needed, pain <4.

## 2021-02-12 ENCOUNTER — Encounter: Payer: Self-pay | Admitting: Gastroenterology

## 2021-02-12 DIAGNOSIS — E785 Hyperlipidemia, unspecified: Secondary | ICD-10-CM | POA: Diagnosis not present

## 2021-02-12 DIAGNOSIS — I2510 Atherosclerotic heart disease of native coronary artery without angina pectoris: Secondary | ICD-10-CM | POA: Diagnosis not present

## 2021-02-12 DIAGNOSIS — M1990 Unspecified osteoarthritis, unspecified site: Secondary | ICD-10-CM | POA: Diagnosis not present

## 2021-02-12 DIAGNOSIS — I10 Essential (primary) hypertension: Secondary | ICD-10-CM | POA: Diagnosis not present

## 2021-02-14 DIAGNOSIS — C4490 Unspecified malignant neoplasm of skin, unspecified: Secondary | ICD-10-CM

## 2021-02-14 DIAGNOSIS — F0390 Unspecified dementia without behavioral disturbance: Secondary | ICD-10-CM

## 2021-02-14 HISTORY — DX: Unspecified malignant neoplasm of skin, unspecified: C44.90

## 2021-02-14 HISTORY — DX: Unspecified dementia, unspecified severity, without behavioral disturbance, psychotic disturbance, mood disturbance, and anxiety: F03.90

## 2021-03-04 DIAGNOSIS — L03011 Cellulitis of right finger: Secondary | ICD-10-CM | POA: Diagnosis not present

## 2021-03-04 DIAGNOSIS — M19041 Primary osteoarthritis, right hand: Secondary | ICD-10-CM | POA: Diagnosis not present

## 2021-03-18 ENCOUNTER — Other Ambulatory Visit: Payer: Self-pay | Admitting: Cardiovascular Disease

## 2021-03-31 DIAGNOSIS — M19041 Primary osteoarthritis, right hand: Secondary | ICD-10-CM | POA: Diagnosis not present

## 2021-03-31 DIAGNOSIS — E611 Iron deficiency: Secondary | ICD-10-CM | POA: Diagnosis not present

## 2021-03-31 DIAGNOSIS — Z23 Encounter for immunization: Secondary | ICD-10-CM | POA: Diagnosis not present

## 2021-03-31 DIAGNOSIS — K219 Gastro-esophageal reflux disease without esophagitis: Secondary | ICD-10-CM | POA: Diagnosis not present

## 2021-03-31 DIAGNOSIS — G479 Sleep disorder, unspecified: Secondary | ICD-10-CM | POA: Diagnosis not present

## 2021-03-31 DIAGNOSIS — F418 Other specified anxiety disorders: Secondary | ICD-10-CM | POA: Diagnosis not present

## 2021-03-31 DIAGNOSIS — J45909 Unspecified asthma, uncomplicated: Secondary | ICD-10-CM | POA: Diagnosis not present

## 2021-03-31 DIAGNOSIS — E785 Hyperlipidemia, unspecified: Secondary | ICD-10-CM | POA: Diagnosis not present

## 2021-03-31 DIAGNOSIS — I10 Essential (primary) hypertension: Secondary | ICD-10-CM | POA: Diagnosis not present

## 2021-03-31 DIAGNOSIS — M1990 Unspecified osteoarthritis, unspecified site: Secondary | ICD-10-CM | POA: Diagnosis not present

## 2021-03-31 DIAGNOSIS — I2510 Atherosclerotic heart disease of native coronary artery without angina pectoris: Secondary | ICD-10-CM | POA: Diagnosis not present

## 2021-03-31 DIAGNOSIS — E1169 Type 2 diabetes mellitus with other specified complication: Secondary | ICD-10-CM | POA: Diagnosis not present

## 2021-04-01 DIAGNOSIS — H40013 Open angle with borderline findings, low risk, bilateral: Secondary | ICD-10-CM | POA: Diagnosis not present

## 2021-04-21 ENCOUNTER — Ambulatory Visit
Admission: RE | Admit: 2021-04-21 | Discharge: 2021-04-21 | Disposition: A | Discharge status: Home / Self Care | Payer: PPO | Source: Ambulatory Visit | Attending: Registered Nurse | Admitting: Registered Nurse

## 2021-04-21 ENCOUNTER — Other Ambulatory Visit: Payer: Self-pay

## 2021-04-21 ENCOUNTER — Encounter: Payer: Self-pay | Admitting: Podiatry

## 2021-04-21 ENCOUNTER — Ambulatory Visit: Payer: PPO | Admitting: Podiatry

## 2021-04-21 ENCOUNTER — Other Ambulatory Visit: Payer: Self-pay | Admitting: Registered Nurse

## 2021-04-21 DIAGNOSIS — R519 Headache, unspecified: Secondary | ICD-10-CM

## 2021-04-21 DIAGNOSIS — R42 Dizziness and giddiness: Secondary | ICD-10-CM | POA: Diagnosis not present

## 2021-04-21 DIAGNOSIS — I2510 Atherosclerotic heart disease of native coronary artery without angina pectoris: Secondary | ICD-10-CM | POA: Diagnosis not present

## 2021-04-21 DIAGNOSIS — E1142 Type 2 diabetes mellitus with diabetic polyneuropathy: Secondary | ICD-10-CM | POA: Diagnosis not present

## 2021-04-21 DIAGNOSIS — L03011 Cellulitis of right finger: Secondary | ICD-10-CM | POA: Insufficient documentation

## 2021-04-21 DIAGNOSIS — B351 Tinea unguium: Secondary | ICD-10-CM | POA: Diagnosis not present

## 2021-04-21 DIAGNOSIS — M79676 Pain in unspecified toe(s): Secondary | ICD-10-CM

## 2021-04-21 DIAGNOSIS — I10 Essential (primary) hypertension: Secondary | ICD-10-CM | POA: Diagnosis not present

## 2021-04-22 ENCOUNTER — Other Ambulatory Visit: Payer: Self-pay | Admitting: Registered Nurse

## 2021-04-22 DIAGNOSIS — I10 Essential (primary) hypertension: Secondary | ICD-10-CM

## 2021-04-22 DIAGNOSIS — R519 Headache, unspecified: Secondary | ICD-10-CM

## 2021-04-28 NOTE — Progress Notes (Signed)
?  Subjective:  ?Patient ID: Hunter Mcbride., male    DOB: 1945-07-09,  MRN: [id number] ? ?Hunter Mcbride. presents to clinic today for at risk foot care with history of diabetic neuropathy and painful elongated mycotic toenails 1-5 bilaterally which are tender when wearing enclosed shoe gear. Pain is relieved with periodic professional debridement. ? ?Patient states blood glucose was 120 mg/dl today.   ? ?New problem(s): None.  ? ?PCP is Crist Infante, MD , and last visit was March 31, 2021. ? ?Allergies  ?Allergen Reactions  ? Hydrocodone Nausea Only  ?  Other reaction(s): nauseated ?Other reaction(s): nauseated ?Other reaction(s): nauseated ?Other reaction(s): nauseated  ? Methocarbamol Nausea Only  ?  Other reaction(s): nauseated ?Other reaction(s): nauseated ?Other reaction(s): nauseated ?Other reaction(s): nauseated  ? Oxycodone Nausea Only  ? ? ?Review of Systems: Negative except as noted in the HPI. ? ?Objective: No changes noted in today's physical examination. ?Hunter Mcbride. is a pleasant 76 y.o. male in NAD. AAO X 3. ? ?Vascular Examination: ?CFT immediate b/l LE. Palpable DP/PT pulses b/l LE. Digital hair present b/l. Skin temperature gradient WNL b/l. No pain with calf compression b/l. No edema noted b/l. No cyanosis or clubbing noted b/l LE. ? ?Dermatological Examination: ?Pedal integument with normal turgor, texture and tone b/l LE. No open wounds b/l. No interdigital macerations b/l. Toenails 1-5 b/l elongated, thickened, discolored with subungual debris. Right great toenail is the most involved. +Tenderness with dorsal palpation of nailplates. No hyperkeratotic or porokeratotic lesions present. ? ?Musculoskeletal Examination: ?Normal muscle strength 5/5 to all lower extremity muscle groups bilaterally. HAV with bunion deformity noted b/l LE. Hammertoe deformity noted 2-5 b/l.[name] No pain, crepitus or joint limitation noted with ROM b/l LE.  Patient ambulates independently without assistive  aids. ? ?Neurological Examination: ?Pt has subjective symptoms of neuropathy. Protective sensation intact 5/5 intact bilaterally with 10g monofilament b/l. Vibratory sensation intact b/l. ? ?Assessment/Plan: ?1. Pain due to onychomycosis of toenail   ?2. Diabetic peripheral neuropathy associated with type 2 diabetes mellitus ([place])   ?  ?-Examined patient. ?-Patient to continue soft, supportive shoe gear daily. ?-Toenails 1-5 b/l were debrided in length and girth with sterile nail nippers and dremel without iatrogenic bleeding.  ?-Patient/POA to call should there be question/concern in the interim.  ? ?Return in about 3 months (around 07/22/2021). ? ?Marzetta Board, DPM  ?

## 2021-05-19 ENCOUNTER — Ambulatory Visit: Payer: PPO | Admitting: Podiatry

## 2021-05-19 ENCOUNTER — Encounter: Payer: Self-pay | Admitting: Podiatry

## 2021-05-19 DIAGNOSIS — L600 Ingrowing nail: Secondary | ICD-10-CM | POA: Diagnosis not present

## 2021-05-19 DIAGNOSIS — L603 Nail dystrophy: Secondary | ICD-10-CM

## 2021-05-19 DIAGNOSIS — E1142 Type 2 diabetes mellitus with diabetic polyneuropathy: Secondary | ICD-10-CM | POA: Diagnosis not present

## 2021-05-19 NOTE — Patient Instructions (Signed)
Soak Instructions    THE DAY AFTER THE PROCEDURE  Place 1/4 cup of epsom salts (or betadine, or white vinegar) in a quart of warm tap water.  Submerge your foot or feet with outer bandage intact for the initial soak; this will allow the bandage to become moist and wet for easy lift off.  Once you remove your bandage, continue to soak in the solution for 20 minutes.  This soak should be done twice a day.  Next, remove your foot or feet from solution, blot dry the affected area and cover.  You may use a band aid large enough to cover the area or use gauze and tape.  Apply other medications to the area as directed by the doctor such as polysporin neosporin.  IF YOUR SKIN BECOMES IRRITATED WHILE USING THESE INSTRUCTIONS, IT IS OKAY TO SWITCH TO  WHITE VINEGAR AND WATER. Or you may use antibacterial soap and water to keep the toe clean  Monitor for any signs/symptoms of infection. Call the office immediately if any occur or go directly to the emergency room. Call with any questions/concerns.    [facility] Instructions-Post Nail Surgery  You have had your ingrown toenail and root treated with a chemical.  This chemical causes a burn that will drain and ooze like a blister.  This can drain for 6-8 weeks or longer.  It is important to keep this area clean, covered, and follow the soaking instructions dispensed at the time of your surgery.  This area will eventually dry and form a scab.  Once the scab forms you no longer need to soak or apply a dressing.  If at any time you experience an increase in pain, redness, swelling, or drainage, you should contact the office as soon as possible.  

## 2021-05-19 NOTE — Progress Notes (Signed)
?Subjective:  ?Patient ID: Hunter Mcbride., male    DOB: August 27, 1945,   MRN: [id number] ? ?Chief Complaint  ?Patient presents with  ? Nail Problem  ?   ?L great toe rednes, swollen, diabetic  ? ? ?76 y.o. male presents for concern of left great toe redness swelling and pain. Denies any injury. Relates most of the time his foot is numb. States the pain and tingling in the foot has been worsening. Relates a history of back surgery and drop foot that has improved.  Relates he is diabetic and last A1c was  7.3 . Denies any other pedal complaints. Denies n/v/f/c.  ? ?Past Medical History:  ?Diagnosis Date  ? Arthritis   ? "lower back; right shoulder; left thumb; joints" (06/21/2013)  ? Asthma   ? "not sure if this is true or not" (06/21/2013)  ? CAD (coronary artery disease) 06/2013  ? s/p PTCA/DES x 3 to mid LAD 2015. b. s/p CABG in 2018. c. cath 12/2017 -> med rx.  ? Cancer ([place])   ? Clotting disorder ([place])   ? GERD (gastroesophageal reflux disease)   ? Gout   ? "maybe twice in my life"  ? Headache(784.0)   ? "weekly for the last 3-4 months" (06/21/2013)  ? Hyperlipidemia   ? Hypertension   ? Myocardial infarction [facility])   ? "/Dr. Katharina Caper I've had one between 2014-2015) (06/21/2013)  ? Neuromuscular disorder ([place])   ? Osteoarthritis   ? Sleep apnea   ? WEIGHT LOSS, NO LONGER NEEDS PER PATIENT  ? Type II diabetes mellitus ([place])   ? TYPE 2  ? ? ?Objective:  ?Physical Exam: ?Vascular: DP/PT pulses 2/4 bilateral. CFT <3 seconds. Normal hair growth on digits. No edema.  ?Skin. No lacerations or abrasions bilateral feet. Left hallux nail thickened with surrounding erythema to the medial and lateral borders. Pain to palpation of this nail. Incurvation noted to lateral border  ?Musculoskeletal: MMT 5/5 bilateral lower extremities in DF, PF, Inversion and Eversion. Deceased ROM in DF of ankle joint.  ?Neurological: Sensation intact to light touch.  ? ?Assessment:  ? ?1. Onychodystrophy   ?2. Ingrown left greater toenail   ?3. Diabetic  peripheral neuropathy associated with type 2 diabetes mellitus ([place])   ? ? ? ?Plan:  ?Patient was evaluated and treated and all questions answered. ?Patient requesting removal of ingrown nail today. Procedure below. ?Discussed procedure and post procedure care and patient expressed understanding.  ?Will follow-up in 2 weeks for nail check or sooner if any problems arise.  ?Discussed following up with neurologist for radiculopathy pain vs diabetic neuropathy and treatment options.  ? ? ?Procedure:  ?Procedure: total Nail Avulsion of left hallux nail ?Surgeon: Lorenda Peck, DPM  ?Pre-op Dx: Ingrown toenail with infection ?Post-op: Same  ?Place of Surgery: Office exam room.  ?Indications for surgery: Painful and ingrown toenail.  ? ? ?The patient is requesting removal of nail without chemical matrixectomy. Risks and complications were discussed with the patient for which they understand and written consent was obtained. Under sterile conditions a total of 3 mL of  1% lidocaine plain was infiltrated in a hallux block fashion. Once anesthetized, the skin was prepped in sterile fashion. A tourniquet was then applied. Next the left hallux nail was removed. Area was irrigated.  Silvadene was applied. A dry sterile dressing was applied. After application of the dressing the tourniquet was removed and there is found to be an immediate capillary refill time to the digit. The  patient tolerated the procedure well without any complications. Post procedure instructions were discussed the patient for which he verbally understood. Follow-up in two weeks for nail check or sooner if any problems are to arise. Discussed signs/symptoms of infection and directed to call the office immediately should any occur or go directly to the emergency room. In the meantime, encouraged to call the office with any questions, concerns, changes symptoms. ? ? ?Lorenda Peck, DPM  ? ? ?

## 2021-05-20 ENCOUNTER — Encounter: Payer: Self-pay | Admitting: Neurology

## 2021-05-20 ENCOUNTER — Ambulatory Visit: Payer: PPO | Admitting: Neurology

## 2021-05-20 VITALS — BP 117/69 | HR 78 | Ht 69.0 in | Wt 194.0 lb

## 2021-05-20 DIAGNOSIS — R251 Tremor, unspecified: Secondary | ICD-10-CM

## 2021-05-20 DIAGNOSIS — R0683 Snoring: Secondary | ICD-10-CM | POA: Insufficient documentation

## 2021-05-20 DIAGNOSIS — R413 Other amnesia: Secondary | ICD-10-CM

## 2021-05-20 DIAGNOSIS — I2510 Atherosclerotic heart disease of native coronary artery without angina pectoris: Secondary | ICD-10-CM | POA: Diagnosis not present

## 2021-05-20 DIAGNOSIS — I2584 Coronary atherosclerosis due to calcified coronary lesion: Secondary | ICD-10-CM | POA: Diagnosis not present

## 2021-05-20 DIAGNOSIS — F515 Nightmare disorder: Secondary | ICD-10-CM | POA: Diagnosis not present

## 2021-05-20 NOTE — Progress Notes (Signed)
? ? ? ?SLEEP MEDICINE CLINIC ?  ? ?Provider:  Larey Seat, MD  ?Primary Care Physician:  Crist Infante, MD ?[address] ?[place] [address]  ? ?  ?Referring Provider: Crist Infante, Md ?[address] ?[place],  [place] [address]  ?  ?  ?    ?Chief Complaint according to patient   ?Patient presents with:  ?  ? New Patient (Initial Visit)  ?     ?  ?  ?HISTORY OF PRESENT ILLNESS:  ?Hunter Lindahl. is a 76 y.o. Caucasian male patient seen here as a referral on 05/20/2021 from Dr Joylene Draft for a REM Sleep Behavior Disorder.  ? ?Chief concern according to patient:  Rm 11, w wife Hunter Mcbride. Pt referred by Crist Infante for sleep consult. Pt having vivid dreams, yelling and taking, limb movement, and poor sleep overall. During these events pt has no memory of these events. Pt tends to cure in his dreams. Has gotten out of bed one night and kept walking into the wall thinking theres a door opening. Ongoing for a year and worsened last 6 months. Not every night. Usually sitting on the bed and always grabbing or defending himself, cussing and  agitation. He left the bed only one time, trying to walk through the bedroom wall, he had dreamt he was in his childhood home. ?He has started to have tremors.  ?Last PSG over 10 years ago, and apnea was found at the time- he no longer used CPAP after weight loss.  ?  ?He has a past medical history of Arthritis, Asthma, CAD (coronary artery disease) (06/2013), Cancer ([place]), Clotting disorder ([place]), GERD (gastroesophageal reflux disease), Gout, Headache(784.0), Hyperlipidemia, Hypertension, Myocardial infarction [facility]), Neuromuscular disorder ([place]), Osteoarthritis, Sleep apnea, and Type II diabetes mellitus ([place]). ?  ?The patient had the first sleep study over 10 years ago - [address]. [facility].  ? ?  ?Sleep relevant medical history: Numbness in feet and below knee- forgetfulness - word finding delayed.  ?Nocturia 2-4,  ?Sleep walking- one episode , REM Parasomnia ,  Tremor, memory loss. No RLS.  ?  ?Family medical /sleep history: daughter on CPAP with OSA, no insomnia, no sleep walkers.  ?  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 He  lives in a household with spouse,  youngest son moved back in, older son and daughter are having their own  homes.  ?Pets are not present. ?Tobacco use-  none.   ?ETOH use - never more than 1/months,  ?Caffeine intake in form of Coffee( 1 ci up in AM ) Soda( /) Tea ( /) or energy drinks. ?Regular exercise in form of outdoor activities. [name]   ?Hobbies :deer and [place] hunting,  ? ?  ?  ?Sleep habits are as follows: The patient's dinner time is between 5-6 PM. The patient goes to bed at 9.30 PM and  promptly to got to sleep for 3-4 hours, wakes for 2 bathroom breaks, the first time at 2 AM.   ?The second half of the night is more fragmented, he snores, he acts out dreams. Mostly around 2-4 AM  ? ?The preferred sleep position is supine ( he had right shoulder surgery)  , with the support of 2 pillows.  ?Dreams are reportedly  frequent/vivid.  ?7  AM is the usual rise time. The patient wakes up spontaneously. ?He  reports not feeling refreshed or restored in AM, with symptoms such  as dry mouth,  sore , achiness- not feeling as if he slept much- seldomly morning headaches.  ?Naps are taken infrequently, lasting from 15 to 30 minutes and are refreshing .  ?  ?Review of Systems: ?Out of a complete 14 system review, the patient complains of only the following symptoms, and all other reviewed systems are negative.:  ?Fatigue, sleepiness , snoring, fragmented sleep, ? REM BD- parasomnia ?Memory  ?Tremor  ?  ?How likely are you to doze in the following situations: ?0 = not likely, 1 = slight chance, 2 = moderate chance, 3 = high chance ?  ?Sitting and Reading? ?Watching Television? ?Sitting inactive in a public place (theater or meeting)? ?As a passenger in a car for an hour without a break? ?Lying down in the  afternoon when circumstances permit? ?Sitting and talking to someone? ?Sitting quietly after lunch without alcohol? ?In a car, while stopped for a few minutes in traffic? ?  ?Total = 12/ 24 points  ? FSS endorsed at 16/ 63 points.  ?GDS : 2/ 15 points.  ? ?Social History  ? ?Socioeconomic History  ? Marital status: Married  ?  Spouse name: Hunter Mcbride  ? Number of children: 3  ? Years of education: Not on file  ? Highest education level: High school graduate  ?Occupational History  ? Not on file  ?Tobacco Use  ? Smoking status: Never  ?  Passive exposure: Never  ? Smokeless tobacco: Never  ?Vaping Use  ? Vaping Use: Never used  ?Substance and Sexual Activity  ? Alcohol use: No  ? Drug use: No  ? Sexual activity: Yes  ?Other Topics Concern  ? Not on file  ?Social History Narrative  ? Lives at home with wife and temporarily young son  ? Right handed  ? Caffeine: 1 C of coffee in the AM  ? ?Social Determinants of Health  ? ?Financial Resource Strain: Not on file  ?Food Insecurity: Not on file  ?Transportation Needs: Not on file  ?Physical Activity: Not on file  ?Stress: Not on file  ?Social Connections: Not on file  ? ? ?Family History  ?Problem Relation Age of Onset  ? Colon polyps Mother   ? Diabetes type II Mother   ? Heart disease Father   ? Heart attack Father   ? Heart disease Brother   ? Coronary artery disease Cousin   ? Colon cancer Neg Hx   ? Pancreatic cancer Neg Hx   ? Esophageal cancer Neg Hx   ? Stomach cancer Neg Hx   ? Liver disease Neg Hx   ? Rectal cancer Neg Hx   ? ? ?Past Medical History:  ?Diagnosis Date  ? Arthritis   ? "lower back; right shoulder; left thumb; joints" (06/21/2013)  ? Asthma   ? "not sure if this is true or not" (06/21/2013)  ? CAD (coronary artery disease) 06/2013  ? s/p PTCA/DES x 3 to mid LAD 2015. b. s/p CABG in 2018. c. cath 12/2017 -> med rx.  ? Cancer ([place])   ? Clotting disorder ([place])   ? GERD (gastroesophageal reflux disease)   ? Gout   ? "maybe twice in my life"  ? Headache(784.0)    ? "weekly for the last 3-4 months" (06/21/2013)  ? Hyperlipidemia   ? Hypertension   ? Myocardial infarction [facility])   ? "/Dr. Katharina Caper I've had one between 2014-2015) (06/21/2013)  ? Neuromuscular disorder ([place])   ? Osteoarthritis   ? Sleep apnea   ?  WEIGHT LOSS, NO LONGER NEEDS PER PATIENT  ? Type II diabetes mellitus ([place])   ? TYPE 2  ? ? ?Past Surgical History:  ?Procedure Laterality Date  ? CARDIAC CATHETERIZATION  1980's  ? "once"  ? CARDIAC CATHETERIZATION N/A 07/23/2015  ? Procedure: Left Heart Cath and Coronary Angiography;  Surgeon: Sherren Mocha, MD;  Location: [facility] CV LAB;  Service: Cardiovascular;  Laterality: N/A;  ? CARDIOVASCULAR STRESS TEST  06/10/2008  ? EF 68%  ? CARPAL TUNNEL RELEASE Bilateral   ? COLONOSCOPY    ? CORONARY ANGIOPLASTY WITH STENT PLACEMENT  06/21/2013  ? "3"  ? CORONARY ARTERY BYPASS GRAFT N/A 11/07/2016  ? Procedure: CORONARY ARTERY BYPASS GRAFTING (CABG) x 5 (LIMA to DISTAL LAD, SVG to DIAGONAL, SVG to CIRCUMFLEX, and SVG SEQUENTIALLY to PLB and DISTAL PDA) with EVH of the RIGHT GREATER SAPHENOUS VEIN and LEFT INTERNAL MAMMARY ARTERY HARVEST;  Surgeon: Grace Isaac, MD;  Location: [facility];  Service: Open Heart Surgery;  Laterality: N/A;  ? CORONARY STENT INTERVENTION N/A 06/07/2016  ? Procedure: Coronary Stent Intervention;  Surgeon: Burnell Blanks, MD;  Location: [place] [facility];  Service: Cardiovascular;  Laterality: N/A;  ? EXCISION MORTON'S NEUROMA Left   ? EYE SURGERY Left   ? "removed film over my eye"  ? FRACTIONAL FLOW RESERVE WIRE N/A 06/26/2013  ? Procedure: FRACTIONAL FLOW RESERVE WIRE;  Surgeon: [name], MD;  Location: [facility] CATH LAB;  Service: Cardiovascular;  Laterality: N/A;  ? HERNIA REPAIR Left   ? INTRAVASCULAR PRESSURE WIRE/FFR STUDY N/A 10/28/2016  ? Procedure: INTRAVASCULAR PRESSURE WIRE/FFR STUDY;  Surgeon: Leonie Man, MD;  Location: [facility];  Service: Cardiovascular;  Laterality: N/A;  ? JOINT REPLACEMENT Left  03/2013  ? "thumb"  ? LEFT HEART CATH AND CORONARY ANGIOGRAPHY N/A 06/07/2016  ? Procedure: Left Heart Cath and Coronary Angiography;  Surgeon: Burnell Blanks, MD;  Location: [facility];  Service

## 2021-05-20 NOTE — Patient Instructions (Addendum)
In short, Hunter Mcbride. is presenting with REM BD . ?My goal is to screen this patient for the possibility of REM sleep behavior disorder announcing any kind of Parkinson syndrome or Lewy body disease. ?And for this reason I will order an attended sleep study, with special attention to his REM sleep phases and the muscle tone during such.  I also will order a [place] cognitive assessment test.  Sleep study will also help me to make sure that there is no apnea still present or at least not to a level where it needs intervention. ?The patient does have a prescription for Xanax through primary care physician Dr. Haynes Kerns and can use this even when he is in the sleep lab to assure that he can fall asleep.  I will ask him to try to take nightly melatonin 3 mg or less and see if this actually exacerbates his dreams or reduces them. ? ?. ?

## 2021-06-02 ENCOUNTER — Encounter: Payer: Self-pay | Admitting: Podiatry

## 2021-06-02 ENCOUNTER — Ambulatory Visit: Payer: PPO | Admitting: Podiatry

## 2021-06-02 DIAGNOSIS — M79676 Pain in unspecified toe(s): Secondary | ICD-10-CM | POA: Diagnosis not present

## 2021-06-02 DIAGNOSIS — B351 Tinea unguium: Secondary | ICD-10-CM | POA: Diagnosis not present

## 2021-06-02 DIAGNOSIS — L600 Ingrowing nail: Secondary | ICD-10-CM | POA: Diagnosis not present

## 2021-06-02 DIAGNOSIS — L603 Nail dystrophy: Secondary | ICD-10-CM

## 2021-06-02 DIAGNOSIS — E1142 Type 2 diabetes mellitus with diabetic polyneuropathy: Secondary | ICD-10-CM | POA: Diagnosis not present

## 2021-06-02 MED ORDER — CICLOPIROX 8 % EX SOLN
Freq: Every day | CUTANEOUS | 0 refills | Status: DC
Start: 2021-06-02 — End: 2021-09-27

## 2021-06-02 NOTE — Progress Notes (Signed)
?  Subjective:  ?Patient ID: Hunter Mcbride., male    DOB: Nov 22, 1945,   MRN: [id number] ? ?Chief Complaint  ?Patient presents with  ? Nail Problem  ?  The left big toenail is better and there is some scabbing and there is not any draining and the 3rd toenail on the left has a red spot on it and I am not sure if it is coming from my boots  ? ? ?76 y.o. male presents for follow-up of left hallux toenail removal. Relates it is doing well and not having any pain. Was soaking and using neosporin. Relates he is concerned about the fungus on his nails.  . Denies any other pedal complaints. Denies n/v/f/c.  ? ?Past Medical History:  ?Diagnosis Date  ? Arthritis   ? "lower back; right shoulder; left thumb; joints" (06/21/2013)  ? Asthma   ? "not sure if this is true or not" (06/21/2013)  ? CAD (coronary artery disease) 06/2013  ? s/p PTCA/DES x 3 to mid LAD 2015. b. s/p CABG in 2018. c. cath 12/2017 -> med rx.  ? Cancer ([place])   ? Clotting disorder ([place])   ? GERD (gastroesophageal reflux disease)   ? Gout   ? "maybe twice in my life"  ? Headache(784.0)   ? "weekly for the last 3-4 months" (06/21/2013)  ? Hyperlipidemia   ? Hypertension   ? Myocardial infarction [facility])   ? "/Dr. Katharina Caper I've had one between 2014-2015) (06/21/2013)  ? Neuromuscular disorder ([place])   ? Osteoarthritis   ? Sleep apnea   ? WEIGHT LOSS, NO LONGER NEEDS PER PATIENT  ? Type II diabetes mellitus ([place])   ? TYPE 2  ? ? ?Objective:  ?Physical Exam: ?Vascular: DP/PT pulses 2/4 bilateral. CFT <3 seconds. Normal hair growth on digits. No edema.  ?Skin. No lacerations or abrasions bilateral feet. Left hallux nail bed healing well. Nails 1-5 on right are thickened elongated and with subungual debris.  ?Musculoskeletal: MMT 5/5 bilateral lower extremities in DF, PF, Inversion and Eversion. Deceased ROM in DF of ankle joint. No pain to palpation of the foot.  ?Neurological: Sensation intact to light touch.  ? ?Assessment:  ? ?1. Onychodystrophy   ?2. Ingrown left greater  toenail   ?3. Diabetic peripheral neuropathy associated with type 2 diabetes mellitus ([place])   ?4. Pain due to onychomycosis of toenail   ? ? ? ?Plan:  ?Patient was evaluated and treated and all questions answered. ?Toe was evaluated and appears to be healing well.  ?May discontinue soaks and neosporin.  ?[place] provided.  ?Patient to follow-up for rfc.   ? ? ?Lorenda Peck, DPM  ? ? ?

## 2021-06-15 ENCOUNTER — Telehealth: Payer: Self-pay | Admitting: *Deleted

## 2021-06-15 ENCOUNTER — Other Ambulatory Visit: Payer: Self-pay | Admitting: Cardiovascular Disease

## 2021-06-15 NOTE — Telephone Encounter (Signed)
Hunter Mcbride w/ [facility].calling to request clinical information needed for the appeal process of medication (ciclopirox), wanting the diagnosis confirmed that medication is for a culture or pathology, matrex involvement. Please contact: [phone] be faxed to : [phone]. ?

## 2021-06-16 NOTE — Telephone Encounter (Signed)
Refaxed with diagnosis/matrix involvement,confirmation 06/17/21

## 2021-06-16 NOTE — Telephone Encounter (Signed)
Faxed the last office visit notes to [facility].([phone]), not sure about the  matrix involvement. Please advise. ?

## 2021-06-28 ENCOUNTER — Institutional Professional Consult (permissible substitution): Payer: PPO | Admitting: Neurology

## 2021-06-30 DIAGNOSIS — I2510 Atherosclerotic heart disease of native coronary artery without angina pectoris: Secondary | ICD-10-CM | POA: Diagnosis not present

## 2021-06-30 DIAGNOSIS — E1169 Type 2 diabetes mellitus with other specified complication: Secondary | ICD-10-CM | POA: Diagnosis not present

## 2021-06-30 DIAGNOSIS — Z794 Long term (current) use of insulin: Secondary | ICD-10-CM | POA: Diagnosis not present

## 2021-06-30 DIAGNOSIS — I10 Essential (primary) hypertension: Secondary | ICD-10-CM | POA: Diagnosis not present

## 2021-07-11 ENCOUNTER — Ambulatory Visit (INDEPENDENT_AMBULATORY_CARE_PROVIDER_SITE_OTHER): Payer: PPO | Admitting: Neurology

## 2021-07-11 DIAGNOSIS — R413 Other amnesia: Secondary | ICD-10-CM

## 2021-07-11 DIAGNOSIS — R0683 Snoring: Secondary | ICD-10-CM | POA: Diagnosis not present

## 2021-07-11 DIAGNOSIS — R251 Tremor, unspecified: Secondary | ICD-10-CM

## 2021-07-11 DIAGNOSIS — F515 Nightmare disorder: Secondary | ICD-10-CM | POA: Diagnosis not present

## 2021-07-21 ENCOUNTER — Other Ambulatory Visit: Payer: Self-pay | Admitting: Cardiovascular Disease

## 2021-07-26 ENCOUNTER — Ambulatory Visit: Payer: PPO | Admitting: Podiatry

## 2021-07-26 DIAGNOSIS — L570 Actinic keratosis: Secondary | ICD-10-CM | POA: Diagnosis not present

## 2021-07-26 DIAGNOSIS — Z85828 Personal history of other malignant neoplasm of skin: Secondary | ICD-10-CM | POA: Diagnosis not present

## 2021-07-27 ENCOUNTER — Ambulatory Visit: Payer: PPO | Admitting: Cardiovascular Disease

## 2021-07-27 ENCOUNTER — Telehealth: Payer: Self-pay | Admitting: Neurology

## 2021-07-27 ENCOUNTER — Ambulatory Visit: Payer: PPO | Admitting: Podiatry

## 2021-07-27 ENCOUNTER — Encounter: Payer: Self-pay | Admitting: Podiatry

## 2021-07-27 DIAGNOSIS — B351 Tinea unguium: Secondary | ICD-10-CM | POA: Diagnosis not present

## 2021-07-27 DIAGNOSIS — M79676 Pain in unspecified toe(s): Secondary | ICD-10-CM

## 2021-07-27 DIAGNOSIS — E1142 Type 2 diabetes mellitus with diabetic polyneuropathy: Secondary | ICD-10-CM

## 2021-07-27 NOTE — Telephone Encounter (Signed)
Called the patient back  and spoke with him and his wife. Discussed the sleep study was negative for sleep apnea. The study was diagnostic for REM BD. The patient is taking the melatonin 2.5 mg at bedtime currently and did take this the night of the SS. The wife states that when he first started to take this it really helped but the last couple weeks has been worse. Advised that she typically tried the melatonin route first. Advised would increase to 5 mg at bedtime and see if benefit is noted. Informed that if it isn't relief from the increase to let us know and at that point we would need to discuss the next option, Klonopin. Pt is already taking xanax up to BID prn. Advised that we likely we would have to discuss whether he can be on both.  Pt verbalized understanding. I have scheduled them for a follow up visit to further discuss SSR, potential PD work up and [place] repeated.  They were appreciative for the call back.

## 2021-07-27 NOTE — Progress Notes (Signed)
  Subjective:  Patient ID: Hunter Mcbride., male    DOB: 10/07/1945,   MRN: [id number]  No chief complaint on file.   76 y.o. male presents for concern of thickened elongated and painful nails that are difficult to trim. Requesting to have them trimmed today. Relates burning and tingling in their feet. Patient is diabetic and last A1c was  Lab Results  Component Value Date   HGBA1C 7.3 (H) 07/26/2019   .   PCP:  Crist Infante, MD    . Denies any other pedal complaints. Denies n/v/f/c.   Past Medical History:  Diagnosis Date   Arthritis    "lower back; right shoulder; left thumb; joints" (06/21/2013)   Asthma    "not sure if this is true or not" (06/21/2013)   CAD (coronary artery disease) 06/2013   s/p PTCA/DES x 3 to mid LAD 2015. b. s/p CABG in 2018. c. cath 12/2017 -> med rx.   Cancer ([place])    Clotting disorder ([place])    GERD (gastroesophageal reflux disease)    Gout    "maybe twice in my life"   Headache(784.0)    "weekly for the last 3-4 months" (06/21/2013)   Hyperlipidemia    Hypertension    Myocardial infarction ([place])    "/Dr. Carlton Adam had one between 2014-2015) (06/21/2013)   Neuromuscular disorder ([place])    Osteoarthritis    Sleep apnea    WEIGHT LOSS, NO LONGER NEEDS PER PATIENT   Type II diabetes mellitus ([facility])    TYPE 2    Objective:  Physical Exam: Vascular: DP/PT pulses 2/4 bilateral. CFT <3 seconds. Normal hair growth on digits. No edema.  Skin. No lacerations or abrasions bilateral feet. Left hallux nail bed healing well. Nails 1-5 bilateral are thickened elongated and with subungual debris.  Musculoskeletal: MMT 5/5 bilateral lower extremities in DF, PF, Inversion and Eversion. Deceased ROM in DF of ankle joint. No pain to palpation of the foot.  Neurological: Sensation intact to light touch. Protective sensation intact b/l.   Assessment:   1. Pain due to onychomycosis of toenail   2. Diabetic peripheral neuropathy associated with type 2 diabetes  mellitus ([place])      Plan:  Patient was evaluated and treated and all questions answered. -Discussed and educated patient on diabetic foot care, especially with  regards to the vascular, neurological and musculoskeletal systems.  -Stressed the importance of good glycemic control and the detriment of not  controlling glucose levels in relation to the foot. -Discussed supportive shoes at all times and checking feet regularly.  -Mechanically debrided all nails 1-5 bilateral using sterile nail nipper and filed with dremel without incident  -Answered all patient questions -Patient to return  in 3 months for at risk foot care -Patient advised to call the office if any problems or questions arise in the meantime.    Lorenda Peck, DPM

## 2021-07-27 NOTE — Telephone Encounter (Signed)
Called and spoke with wife/patient. They were calling about sleep study results from study he completed on 07/11/2021.  He was requesting a call back this afternoon or tomorrow. Advised Dr. Brett Fairy has been out of the office for three weeks, just got back in yesterday. Cannot guarentee a call back in that timeframe but will send her message that he is requesting results. Will call him back this week (Thursday at the latest). He relayed does not want to wait another week for results. Would like results this week.

## 2021-07-27 NOTE — Progress Notes (Signed)
PERIODIC LIMB MOVEMENTS:   The patient had a total of 141 Limb Movements.  The Periodic Limb Movement (PLM) index was 19.1 and the PLM Arousal index was 0.4/hour. Few of these limb movements were noted in REM but there were movements in REM as well as sleep talking. PLMs were clustered in the time between 21.45 and 23.00 hours.  the patient slept on his back during that time. Audio and video analysis do show movements in REM , behaviors, phonations and vocalizations.   EKG was in keeping with normal sinus rhythm (NSR).  IMPRESSION:  1. No evidence of Obstructive Sleep Apnea (OSA) 2. 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 REM sleep somniloquism.  RECOMMENDATIONS:  Treatment for REM BD includes melatonin, and often requires Klonopin as well.  Our follow up appointment will be directed towards those disorders associated with REM BD, such as Parkinson's disease and Lewy body disease.  That will include MMSE/ MOCA testing and possibly a DAT scan.

## 2021-07-27 NOTE — Procedures (Signed)
PATIENT'S NAME:  Hunter Mcbride, Hunter Mcbride DOB:      October 05, 1945      MR#:    [id number]     DATE OF RECORDING: 07/11/2021 REFERRING M.D.:  Crist Infante MD Study Performed:   Bonnita Levan Polysomnogram HISTORY:  Zymarion Favorite. is a 76 y.o. Caucasian male patient seen here as a referral on 05/20/2021 from Dr Joylene Draft for evaluation of possible  REM Sleep Behavior Disorder.    Chief concern according to patient:  Rm 11, w wife Hunter Mcbride. Pt referred by Crist Infante for sleep consult. Pt having vivid dreams, yelling and sleep talking, irregular  limb movements, and poor sleep overall. he has no memory of these events. Ongoing for a year and worsened last 6 months. Not every night. Usually sitting on the bed and always grabbing or defending himself, out of sorts - with cussing and agitation. He left the bed only one time, trying to walk through the bedroom wall, he had dreamt he was in his childhood home. He has started to have tremors affecting his tongue and hands.  He developed short term memory loss. He is snoring.   Last PSG over 10 years ago, and apnea was found at the time- he no longer used CPAP after weight loss.    He has a past medical history of Arthritis, Asthma, CAD (coronary artery disease) (06/2013), Cancer (Hunter Mcbride), Clotting disorder (Hunter Mcbride), GERD (gastroesophageal reflux disease), Gout, Headache (784.0), Hyperlipidemia, Hypertension, Myocardial infarction [facility]), Neuromuscular disorder ([place]), Osteoarthritis, Sleep apnea, and Type II diabetes mellitus (Hunter Mcbride).  The patient endorsed the Epworth Sleepiness Scale at 12/24 points.   The patient's weight 194 pounds with a height of 69 (inches), resulting in a BMI of 28.7 kg/m2. The patient's neck circumference measured 17 inches.  CURRENT MEDICATIONS: Xanax, Tylenol, Norvasc, Aspirin, Tenormin, Lipitor, Super C, Tums,  Plavix, Colace, Jardiance, Pepcid, Neurontin, Hydrodiuril, Avapro, Imdur, Glucophage XR, Bactroban, Nitrostat, Protonix, Miralax, Phenergan,  Ranexa,   PROCEDURE:  This is a multichannel digital polysomnogram utilizing the Somnostar 11.2 system.  Electrodes and sensors were applied and monitored per AASM Specifications.   EEG, EOG, Chin and Limb EMG, were sampled at 200 Hz.  ECG, Snore and Nasal Pressure, Thermal Airflow, Respiratory Effort, CPAP Flow and Pressure, Oximetry was sampled at 50 Hz. Digital video and audio were recorded.      BASELINE STUDY  Lights Out was at 21:02 and Lights On at 05:02.  Total recording time (TRT) was 480.5 minutes, with a total sleep time (TST) of 442.5 minutes.   The patient's sleep latency was 17.5 minutes.  REM latency was 107 minutes.  The sleep efficiency was 92.1 %.     SLEEP ARCHITECTURE: WASO (Wake after sleep onset) was 20.5 minutes.  There were 15.5 minutes in Stage N1, 297 minutes Stage N2, 57 minutes Stage N3 and 73 minutes in Stage REM.  The percentage of Stage N1 was 3.5%, Stage N2 was 67.1%, Stage N3 was 12.9% and Stage R (REM sleep) was 16.5%.     RESPIRATORY ANALYSIS:  There were a total of 12 respiratory events:  3 obstructive apneas, 1 central apneas and 0 mixed apneas with a total of 4 apneas and an apnea index (AI) of .5 /hour. There were 8 hypopneas with a hypopnea index of 1.1 /hour. The patient also had 0 respiratory event related arousals (RERAs).  The arousals were noted as: 44 were spontaneous, 3 were associated with PLMs, 2 were associated with respiratory events.    The  total APNEA/HYPOPNEA INDEX (AHI) was 1.6/hour.  6 events occurred in REM sleep and 11 events in NREM. The REM AHI was  4.9 /hour, versus a non-REM AHI of 1.. The patient spent 303 minutes of total sleep time in the supine position and 140 minutes in non-supine.. The supine AHI was 2.2 versus a non-supine AHI of 0.4.  OXYGEN SATURATION & C02:  The Wake baseline 02 saturation was 92%, with the lowest being 83%. Time spent below 89% saturation equaled 3 minutes.    PERIODIC LIMB MOVEMENTS:   The patient had a  total of 141 Limb Movements.  The Periodic Limb Movement (PLM) index was 19.1 and the PLM Arousal index was 0.4/hour. Few of these limb movements were noted in REM but there were movements in REM as well as sleep talking. PLMs were clustered in the time between 21.45 and 23.00 hours.  the patient slept on his back during that time. Audio and video analysis do show movements in REM , behaviors, phonations and vocalizations.   EKG was in keeping with normal sinus rhythm (NSR).  IMPRESSION:  No evidence of Obstructive Sleep Apnea (OSA) There was a cluster of highly active PLMs- Periodic Limb Movement Disorder (PLMD) which reached into REM sleep and would therefore be considered diagnostic of REM BD. REM sleep somniloquism.  RECOMMENDATIONS:  Treatment for REM BD includes melatonin, and often requires Klonopin as well.  Our follow up appointment will be directed towards those disorders associated with REM BD, such as Parkinson's disease and Lewy body disease.  That will include MMSE/ MOCA testing and PD DAT scan.   I certify that I have reviewed the entire raw data recording prior to the issuance of this report in accordance with the Standards of Accreditation of the American Academy of Sleep Medicine (AASM)   Larey Seat, MD Diplomat, ABPN American Board of Neurology  Diplomat, American Board of Sleep Medicine Medical Director, [facility] [employer]

## 2021-07-27 NOTE — Telephone Encounter (Signed)
Wife is asking for a call with call from results from  Pt MRI.   Imagene Gurney)

## 2021-08-01 ENCOUNTER — Encounter: Payer: Self-pay | Admitting: Cardiovascular Disease

## 2021-08-01 NOTE — Progress Notes (Unsigned)
Hunter Mcbride. Date of Birth  December 19, 1945 [place]  [address]    [place]   [place], [place]  [address]    [place], [place]  [address] [phone]  Fax  [phone]  [phone]  Fax [phone]  Problem list: 1. History of chest pain-negative stress Myoview study in April, 2010 2. Hyperlipidemia 3. Hypertension 4. Diabetes mellitus 5. Sleep apnea   Hunter Mcbride is doing well.  Hunter Mcbride quite active. 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 He's not had any episodes of chest pain, shortness breath, syncope, or presyncope.  06/05/2013:    Hunter Mcbride is doing well.  He has been [place] hunting this am.   No CP or dyspnea.  He has noticed that he has some mild vague chest and arm ache - especially with walking.  He can usually walk through the discomfort and the pain will eventually resolve.  He noticed this discomfort this am while hunting.   He has also noticed this while push mowing.   These have been going on for about 1 year.    He had a myoview in 2012 which was negative.    12/04/2015: Hunter Mcbride is doing well. He he has seen [name] for the past 2-1/2 years. Is seen with wife, [name],   Had a cath in June, 2017.   Has moderate stenosis of Hunter Mcbride branch vessells   Medical therapy was recommended.   Went to the ER in Sept with angina . Thought it may be indigestion.   Was taking NTG .     Imdur was increased . Seems to be improved at this point  Has some orthostatic hypotension because of the increased Imdur dosing .  Is on Ranexa 500 BID   July 25, 2019: Hunter Mcbride is seen back today for evaluation of chest pain.  He has a history of coronary artery disease. Presents with 2 weeks of episodic CP Usually with exertion . Has started radiating to left arm.  Dull ache.  Has significant intrascapular pain .   Dull ache with hot sensation  Took 4 SL NTG on Friday ,  Would ease up for several minutes.  Woke up  frequently Friday night with recurrent cp Had chest soreness since then  Has continued to have these CP  Associated with nausea,   Is very fatigued.  He sat around yesterday ,  Pain eased off.  Last night took more SL nitro, tylenol  August 16, 2019:  Hunter Mcbride is seen today for follow-up visit.  He presented to me last month with episodes of chest discomfort.  We referred him for heart catheterization.  Heart catheterization reveals three-vessel native coronary artery disease with patent grafts including LIMA to LAD, SVG to D2, SVG to OM and SVG to RCA/PDA.  There is been some progression of native coronary artery disease including the RCA but also the ostial left main.  The LIMA is no longer atretic.  No specific reason for the patient's increased anginal symptoms was found.  Medical therapy was continued.  Seems to be doing much better at this point  No further episodes of angina at this point .  Is doing much better in Imdur 30 mg ( which he takes at night )    July 27, 2020: Hunter Mcbride is seen today for follow up of Hunter Mcbride CAD/ CABG. No angina Able to get out and hunt this past spring  No significant angina  Dr. Joylene Draft has encouraged him to participate in a study about inflammation  August 02, 2021 Hunter Mcbride is seen today for follow up of Hunter Mcbride CAD/CABG  No CP. No limitations  Has REM sleep disorder  ( caused him to act out Hunter Mcbride dreams)  Does not feel rested in the AM  Is seeing Dr. Brett Fairy  ( neuro)  Apparently  - Possibly a precursor to Parkinsons  Lewy Body dementia  Is on melatonin    Current Outpatient Medications on File Prior to Visit  Medication Sig Dispense Refill   acetaminophen (TYLENOL) 325 MG tablet Take 325 mg by mouth as needed.     ALPRAZolam (XANAX) 0.5 MG tablet TAKE ONE TABLET BY MOUTH UP TO TWICE A DAY AS NEEDED FOR ANXIETY     amLODipine (NORVASC) 5 MG tablet TAKE ONE TABLET BY MOUTH TWICE DAILY 180 tablet 0   aspirin EC 81 MG tablet Take 81 mg by mouth at bedtime.       atenolol (TENORMIN) 50 MG tablet TAKE ONE TABLET BY MOUTH ONE TIME DAILY at suppertime     atorvastatin (LIPITOR) 80 MG tablet Take 1 tablet by mouth daily.     calcium carbonate (TUMS - DOSED IN MG ELEMENTAL CALCIUM) 500 MG chewable tablet Chew 2 tablets by mouth as needed for indigestion or heartburn.      ciclopirox (LOPROX) 0.77 % cream Apply topically.     clopidogrel (PLAVIX) 75 MG tablet TAKE ONE TABLET BY MOUTH ONE TIME DAILY WITH BREAKFAST 90 tablet 0   docusate sodium (COLACE) 100 MG capsule Take 100 mg by mouth 2 (two) times daily.     empagliflozin (JARDIANCE) 25 MG TABS tablet Take by mouth daily.     famotidine (PEPCID) 40 MG tablet Take 1 tablet (40 mg total) by mouth 2 (two) times daily. 60 tablet 5   fluorouracil (EFUDEX) 5 % cream Apply 1 Application topically 2 (two) times daily.     FREESTYLE TEST STRIPS test strip 1 each by Other route every morning.      gabapentin (NEURONTIN) 300 MG capsule Take 300 mg by mouth daily.     hydrochlorothiazide (HYDRODIURIL) 25 MG tablet Take 1 tablet by mouth every morning.     irbesartan (AVAPRO) 300 MG tablet Take 300 mg by mouth at bedtime.      isosorbide mononitrate (IMDUR) 30 MG 24 hr tablet Take 1 tablet by mouth once daily 90 tablet 3   Melatonin 5 MG CHEW Chew 5 mg by mouth at bedtime.     metFORMIN (GLUCOPHAGE-XR) 500 MG 24 hr tablet Take 1 tablet (500 mg total) by mouth 2 (two) times daily.     mupirocin ointment (BACTROBAN) 2 % Apply to affected area three times a day as needed     Naftifine HCl 2 % CREA APPLY TO AFFECTED AREA(S) TWICE A DAY FOR YEAST     nitroGLYCERIN (NITROSTAT) 0.4 MG SL tablet DISSOLVE 1 TABLET UNDER TONGUE AS NEEDEDFOR CHEST PAIN. MAY REPEAT 5 MINUTES APART 3 TIMES IF NEEDED 25 tablet 3   pantoprazole (PROTONIX) 40 MG tablet Take 1 tablet by mouth 2 (two) times daily.     polyethylene glycol (MIRALAX / GLYCOLAX) 17 g packet Take 17 g by mouth daily.     promethazine (PHENERGAN) 12.5 MG tablet 1 tablet as  needed     ranolazine (RANEXA) 500 MG 12 hr tablet TAKE ONE TABLET BY MOUTH TWICE DAILY 180 tablet 3   TRESIBA FLEXTOUCH  100 UNIT/ML FlexTouch Pen Inject 18 Units into the skin daily.     triamcinolone cream (KENALOG) 0.1 % Apply 1 application topically as needed.     Vitamin D, Cholecalciferol, 25 MCG (1000 UT) CAPS Take 1 capsule by mouth daily.     Bioflavonoid Products (SUPER C-500) TABS Take 1 tablet by mouth daily. (Patient not taking: Reported on 08/02/2021)     ciclopirox (PENLAC) 8 % solution Apply topically at bedtime. Apply over nail and surrounding skin. Apply daily over previous coat. After seven (7) days, may remove with alcohol and continue cycle. (Patient not taking: Reported on 08/02/2021) 6.6 mL 0   furosemide (LASIX) 40 MG tablet Take 40 mg by mouth daily. (Patient not taking: Reported on 08/02/2021)     hydrALAZINE (APRESOLINE) 25 MG tablet Take 25 mg by mouth 3 (three) times daily. (Patient not taking: Reported on 08/02/2021)     LAGEVRIO 200 MG CAPS capsule SMARTSIG:4 Capsule(s) By Mouth Every 12 Hours (Patient not taking: Reported on 08/02/2021)     No current facility-administered medications on file prior to visit.    Allergies  Allergen Reactions   Hydrocodone Nausea Only    Other reaction(s): nauseated Other reaction(s): nauseated Other reaction(s): nauseated Other reaction(s): nauseated   Methocarbamol Nausea Only    Other reaction(s): nauseated Other reaction(s): nauseated Other reaction(s): nauseated Other reaction(s): nauseated   Oxycodone Nausea Only    Past Medical History:  Diagnosis Date   Arthritis    "lower back; right shoulder; left thumb; joints" (06/21/2013)   Asthma    "not sure if this is true or not" (06/21/2013)   CAD (coronary artery disease) 06/2013   s/p PTCA/DES x 3 to mid LAD 2015. b. s/p CABG in 2018. c. cath 12/2017 -> med rx.   Cancer ([place])    Clotting disorder ([place])    GERD (gastroesophageal reflux disease)    Gout    "maybe twice  in my life"   Headache(784.0)    "weekly for the last 3-4 months" (06/21/2013)   Hyperlipidemia    Hypertension    Myocardial infarction ([place])    "/Dr. Katharina Caper I've had one between 2014-2015) (06/21/2013)   Neuromuscular disorder ([place])    Osteoarthritis    Sleep apnea    WEIGHT LOSS, NO LONGER NEEDS PER PATIENT   Type II diabetes mellitus ([place])    TYPE 2    Past Surgical History:  Procedure Laterality Date   CARDIAC CATHETERIZATION  1980's   "once"   CARDIAC CATHETERIZATION N/A 07/23/2015   Procedure: Left Heart Cath and Coronary Angiography;  Surgeon: Sherren Mocha, MD;  Location: [facility];  Service: Cardiovascular;  Laterality: N/A;   CARDIOVASCULAR STRESS TEST  06/10/2008   EF 68%   CARPAL TUNNEL RELEASE Bilateral    COLONOSCOPY     CORONARY ANGIOPLASTY WITH STENT PLACEMENT  06/21/2013   "3"   CORONARY ARTERY BYPASS GRAFT N/A 11/07/2016   Procedure: CORONARY ARTERY BYPASS GRAFTING (CABG) x 5 (LIMA to DISTAL LAD, SVG to DIAGONAL, SVG to CIRCUMFLEX, and SVG SEQUENTIALLY to PLB and DISTAL PDA) with EVH of the RIGHT GREATER SAPHENOUS VEIN and LEFT INTERNAL MAMMARY ARTERY HARVEST;  Surgeon: Grace Isaac, MD;  Location: [facility];  Service: Open Heart Surgery;  Laterality: N/A;   CORONARY STENT INTERVENTION N/A 06/07/2016   Procedure: Coronary Stent Intervention;  Surgeon: Burnell Blanks, MD;  Location: [facility];  Service: Cardiovascular;  Laterality: N/A;   EXCISION MORTON'S NEUROMA Left  EYE SURGERY Left    "removed film over my eye"   FRACTIONAL FLOW RESERVE WIRE N/A 06/26/2013   Procedure: [place];  Surgeon: [name], MD;  Location: [facility];  Service: Cardiovascular;  Laterality: N/A;   HERNIA REPAIR Left    INTRAVASCULAR PRESSURE WIRE/FFR STUDY N/A 10/28/2016   Procedure: INTRAVASCULAR PRESSURE WIRE/FFR STUDY;  Surgeon: Leonie Man, MD;  Location: [facility];  Service: Cardiovascular;  Laterality: N/A;    JOINT REPLACEMENT Left 03/2013   "thumb"   LEFT HEART CATH AND CORONARY ANGIOGRAPHY N/A 06/07/2016   Procedure: Left Heart Cath and Coronary Angiography;  Surgeon: Burnell Blanks, MD;  Location: [facility];  Service: Cardiovascular;  Laterality: N/A;   LEFT HEART CATH AND CORONARY ANGIOGRAPHY N/A 10/28/2016   Procedure: LEFT HEART CATH AND CORONARY ANGIOGRAPHY;  Surgeon: Leonie Man, MD;  Location: [facility];  Service: Cardiovascular;  Laterality: N/A;   LEFT HEART CATH AND CORS/GRAFTS ANGIOGRAPHY N/A 12/25/2017   Procedure: LEFT HEART CATH AND CORS/GRAFTS ANGIOGRAPHY;  Surgeon: Sherren Mocha, MD;  Location: [facility];  Service: Cardiovascular;  Laterality: N/A;   LEFT HEART CATH AND CORS/GRAFTS ANGIOGRAPHY N/A 07/26/2019   Procedure: LEFT HEART CATH AND CORS/GRAFTS ANGIOGRAPHY;  Surgeon: [name], MD;  Location: [facility];  Service: Cardiovascular;  Laterality: N/A;   LEFT HEART CATHETERIZATION WITH CORONARY ANGIOGRAM N/A 06/21/2013   Procedure: LEFT HEART CATHETERIZATION WITH CORONARY ANGIOGRAM;  Surgeon: Burnell Blanks, MD;  Location[facility];  Service: Cardiovascular;  Laterality: N/A;   LEFT HEART CATHETERIZATION WITH CORONARY ANGIOGRAM N/A 06/26/2013   Procedure: LEFT HEART CATHETERIZATION WITH CORONARY ANGIOGRAM;  Surgeon: [name], MD;  Location: [facility];  Service: Cardiovascular;  Laterality: N/A;   LUMBAR LAMINECTOMY/DECOMPRESSION MICRODISCECTOMY Left 05/30/2018   Procedure: Left  Gill L5 decompression;  Surgeon: Melina Schools, MD;  Location: [facility];  Service: Orthopedics;  Laterality: Left;  170min   POLYPECTOMY     TEE WITHOUT CARDIOVERSION N/A 11/07/2016   Procedure: TRANSESOPHAGEAL ECHOCARDIOGRAM (TEE);  Surgeon: Grace Isaac, MD;  Location: [facility];  Service: Open Heart Surgery;  Laterality: N/A;   TONSILLECTOMY     TOTAL HIP ARTHROPLASTY Right 04/12/2016   Procedure: RIGHT TOTAL HIP  ARTHROPLASTY ANTERIOR APPROACH;  Surgeon: Paralee Cancel, MD;  Location: [facility];  Service: Orthopedics;  Laterality: Right;  requests 70 mins   TOTAL SHOULDER ARTHROPLASTY  03/01/2012   Procedure: TOTAL SHOULDER ARTHROPLASTY;  Surgeon: Marin Shutter, MD;  Location: [facility];  Service: Orthopedics;  Laterality: Right;   UMBILICAL HERNIA REPAIR      Social History   Tobacco Use  Smoking Status Never   Passive exposure: Never  Smokeless Tobacco Never    Social History   Substance and Sexual Activity  Alcohol Use No    Family History  Problem Relation Age of Onset   Colon polyps Mother    Diabetes type II Mother    Heart disease Father    Heart attack Father    Heart disease Brother    Coronary artery disease Cousin    Colon cancer Neg Hx    Pancreatic cancer Neg Hx    Esophageal cancer Neg Hx    Stomach cancer Neg Hx    Liver disease Neg Hx    Rectal cancer Neg Hx     Reviw of Systems:  Reviewed in the HPI.  All other systems are  negative.  Physical Exam: Blood pressure 132/72, pulse (!) 55, height 5\' 9"  (1.753 m), weight 197 lb 9.6 oz (89.6 kg), SpO2 97 %.  GEN:  Well nourished, well developed in no acute distress HEENT: Normal NECK: No JVD; No carotid bruits LYMPHATICS: No lymphadenopathy CARDIAC: RRR , no murmurs, rubs, gallops RESPIRATORY:  Clear to auscultation without rales, wheezing or rhonchi  ABDOMEN: Soft, non-tender, non-distended MUSCULOSKELETAL:  No edema; No deformity  SKIN: Warm and dry NEUROLOGIC:  Alert and oriented x 3   ECG    August 02, 2021:  sinus bradycardia,  voltage for LVH, previous Inf. MI     Assessment / Plan:   1. Coronary artery disease:    no angina , is very active   2. Hyperlipidemia:  check lipids today .  Cont atorvastatin   3.  Diabetes Mellitus:    4. REM sleep disorder :   is thought to be a precursor to Parkinsons disease or Lewy body dementia .   Follow up with neuro    Mertie Moores, MD  08/02/2021 4:08 PM    [facility] [place],  [place], [place]  [address] Phone: [phone]; Fax: [phone]

## 2021-08-02 ENCOUNTER — Ambulatory Visit: Payer: PPO | Admitting: Cardiovascular Disease

## 2021-08-02 ENCOUNTER — Encounter: Payer: Self-pay | Admitting: Cardiovascular Disease

## 2021-08-02 VITALS — BP 132/72 | HR 55 | Ht 69.0 in | Wt 197.6 lb

## 2021-08-02 DIAGNOSIS — E785 Hyperlipidemia, unspecified: Secondary | ICD-10-CM

## 2021-08-02 DIAGNOSIS — I2510 Atherosclerotic heart disease of native coronary artery without angina pectoris: Secondary | ICD-10-CM | POA: Diagnosis not present

## 2021-08-02 NOTE — Patient Instructions (Signed)
Medication Instructions:  Your physician recommends that you continue on your current medications as directed. Please refer to the Current Medication list given to you today.  *If you need a refill on your cardiac medications before your next appointment, please call your pharmacy*   Lab Work: NONE If you have labs (blood work) drawn today and your tests are completely normal, you will receive your results only by: MyChart Message (if you have MyChart) OR A paper copy in the mail If you have any lab test that is abnormal or we need to change your treatment, we will call you to review the results.   Testing/Procedures: NONE   Follow-Up: At [facility], you and your health needs are our priority.  As part of our continuing mission to provide you with exceptional heart care, we have created designated Provider Care Teams.  These Care Teams include your primary Cardiologist (physician) and Advanced Practice Providers (APPs -  Physician Assistants and Nurse Practitioners) who all work together to provide you with the care you need, when you need it.  Your next appointment:   1 year(s)  The format for your next appointment:   In Person  Provider:   Sw[place], [place], or Nahser {     Important Information About Sugar       

## 2021-08-05 ENCOUNTER — Other Ambulatory Visit: Payer: Self-pay | Admitting: Cardiovascular Disease

## 2021-09-01 ENCOUNTER — Telehealth: Payer: Self-pay | Admitting: Neurology

## 2021-09-01 ENCOUNTER — Other Ambulatory Visit: Payer: Self-pay | Admitting: *Deleted

## 2021-09-01 MED ORDER — CLONAZEPAM 1 MG PO TABS: 0.5000 mg | ORAL_TABLET | Freq: Every day | ORAL | 5 refills | Status: DC

## 2021-09-01 NOTE — Telephone Encounter (Signed)
Called and spoke w/ wife. Advised per Dr. Brett Fairy that he can either take xanax 0.5mg  po qhs or can try klonopin 1mg , to take 1/2-1 tablet po qhs instead. Would have to stop xanax if he started klonopin.  Wife states he already takes xanax po qhs (1 tab). Ineffective. Would like to change to klonopin. Will have him stop alprazolam.   Rx sent to MD to e-scribe.

## 2021-09-01 NOTE — Addendum Note (Signed)
Addended by: Wyvonnia Lora on: 09/01/2021 04:10 PM   Modules accepted: Orders

## 2021-09-01 NOTE — Telephone Encounter (Signed)
Wife states the increase to 5 mg of the melatonin worked for a period of time but pt is beginning to have original symptoms.  Wife states Dr Brett Fairy advised her to call if symptoms returned, please call.

## 2021-09-09 ENCOUNTER — Other Ambulatory Visit: Payer: Self-pay | Admitting: Cardiovascular Disease

## 2021-09-14 ENCOUNTER — Other Ambulatory Visit: Payer: Self-pay | Admitting: Cardiovascular Disease

## 2021-09-27 ENCOUNTER — Ambulatory Visit: Payer: PPO | Admitting: Neurology

## 2021-09-27 ENCOUNTER — Encounter: Payer: Self-pay | Admitting: Neurology

## 2021-09-27 VITALS — BP 137/74 | HR 85 | Ht 69.0 in | Wt 198.0 lb

## 2021-09-27 DIAGNOSIS — R251 Tremor, unspecified: Secondary | ICD-10-CM | POA: Diagnosis not present

## 2021-09-27 DIAGNOSIS — G309 Alzheimer's disease, unspecified: Secondary | ICD-10-CM | POA: Diagnosis not present

## 2021-09-27 DIAGNOSIS — G4752 REM sleep behavior disorder: Secondary | ICD-10-CM | POA: Diagnosis not present

## 2021-09-27 DIAGNOSIS — G3184 Mild cognitive impairment, so stated: Secondary | ICD-10-CM | POA: Diagnosis not present

## 2021-09-27 DIAGNOSIS — R29898 Other symptoms and signs involving the musculoskeletal system: Secondary | ICD-10-CM | POA: Diagnosis not present

## 2021-09-27 MED ORDER — SERTRALINE HCL 25 MG PO TABS: 25.0000 mg | ORAL_TABLET | Freq: Every day | ORAL | 5 refills | Status: DC

## 2021-09-27 MED ORDER — CLONAZEPAM 0.5 MG PO TABS: 0.2500 mg | ORAL_TABLET | Freq: Two times a day (BID) | ORAL | 2 refills | Status: DC | PRN

## 2021-09-27 NOTE — Patient Instructions (Signed)
)   Dream enactment , clearly has improved initially on meds, but then will return.  2) tremor , tongue and hands, and legs. But no weakness. Cogwheeling.  3) Neuropathy, loss of vibration in knees and below, DM.  4) drop foot on the left, status post back surgery. 5) short term memory loss, loss of words.        He tried Melatonin and it worked for 8 weeks, then we had low dose klonopin 0.25 mg nightly, and he again benefited in terms of less sleep action , but he didn 't like the effects- klonopin made him groggy, tired and depressed.  He has not been as active in his sleep- we are considering reducing the klonopin again,  to 0. 25 mg and adding ZOLOFT 25 mg. He would rather go back on Xanax which has been served him as a sleep aid.  He has bilateral biceps cogwheeling and hoarse voice but fluent EOM.   My Plan is to proceed with:   1) RV in 4-6 months - if he still feels terrible on Klonopin at only 0.25 mg nightly , I will allow him going back on Xanax-  2) Zoloft 25 mg added. REM suppression.  3) MCI by [facility]  he is in the range of MCI- denies visual hallucinations.  4) chronic nausea from metformin. Taking phenergan for nausea will leave him sleepier, too. I like him to get of Phenergan. He has cogwheeling in both biceps and wouldn't use an additional dopamine receptor drug.        I would like to thank Crist Infante, MD ,[address] [place],  [place] [address] for allowing me to meet with and to take care of this pleasant patient.    In short, General Wearing. is presenting with REM BD . My goal is to screen this patient for the possibility of REM sleep behavior disorder announcing any kind of Parkinson syndrome or Lewy body disease. REM sleep somniloquism.     I plan to follow up either personally or through our NP within 4-6 months.

## 2021-09-27 NOTE — Progress Notes (Signed)
SLEEP MEDICINE CLINIC    Provider:  Larey Seat, MD  Primary Care Physician:  Hunter Infante, MD [place] [address]     Referring Provider: Crist Mcbride, [address] [place] [place],  [place] [address]          Chief Complaint according to patient   Patient presents with:     New Patient (Initial Visit)           HISTORY OF PRESENT ILLNESS:  Hunter Mcbride. is a 76 y.o. Caucasian male patient seen herein a RV on 09/27/2021  (from Dr Joylene Draft for a REM Sleep Behavior Disorder) he underwent a sleep study  07-11-2021- .  The patient had a total of 141 Limb Movements.  The Periodic Limb Movement (PLM) index was 19.1 and the PLM Arousal index was 0.4/hour. Few of these limb movements were noted in REM but there were movements in REM as well as sleep talking. PLMs were clustered in the time between 21.45 and 23.00 hours.  the patient slept on his back during that time. Audio and video analysis do show movements in REM , behaviors, phonations and vocalizations.   EKG was in keeping with normal sinus rhythm (NSR).  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 REM sleep somniloquism.   RECOMMENDATIONS:   Treatment for REM BD includes melatonin, and often requires Klonopin as well.  Our follow up appointment will be directed towards those disorders associated with REM BD, such as Parkinson's disease and Lewy body disease.   He tried Melatonin and it worked for 8 weeks, then we had low dose klonopin 0.25 mg nightly, and he again benefited in terms of less sleep action , but he didn 't like the effects- klonopin made him groggy, tired and depressed.  He has not been as active in his sleep- we are considering reducing the klonopin again,  to 0. 25 mg and adding ZOLOFT 25 mg. He would rather go back on Xanax which has been served him as a sleep aid.   .  We also meet to obtain a memory test:    09/27/2021   10:03 AM 05/20/2021   11:36 AM  Montreal Cognitive Assessment   Visuospatial/ Executive (0/5) 3 4  Naming (0/3) 3 3  Attention: Read list of digits (0/2) 2 2  Attention: Read list of letters (0/1) 1 1  Attention: Serial 7 subtraction starting at 100 (0/3) 1 3  Language: Repeat phrase (0/2) 2 2  Language : Fluency (0/1) 1 1  Abstraction (0/2) 2 2  Delayed Recall (0/5) 4 4  Orientation (0/6) 6 5  Total 25 27       05-30-2021: CONSULT Chief concern according to patient:  Rm 11, w wife Hunter Mcbride. Pt referred by Hunter Mcbride for sleep consult. Pt having vivid dreams, yelling and taking, limb movement, and poor sleep overall. During these events pt has no memory of these events. Pt tends to cure in his dreams. Has gotten out of bed one night and kept walking into the wall thinking theres a door opening. Ongoing for a year and worsened last 6 months. Not every night. Usually sitting on the bed and always grabbing or defending himself, cussing and  agitation. He left the bed only one time, trying to walk through the bedroom wall, he had dreamt he was in his  childhood home. He has started to have tremors.  Last PSG over 10 years ago, and apnea was found at the time- he no longer used CPAP after weight loss.    He has a past medical history of Arthritis, Asthma, CAD (coronary artery disease) (06/2013), Cancer ([place]), Clotting disorder ([place]), GERD (gastroesophageal reflux disease), Gout, Headache(784.0), Hyperlipidemia, Hypertension, Myocardial infarction [facility]), Neuromuscular disorder ([place]), Osteoarthritis, Sleep apnea, and Type II diabetes mellitus ([place]).   The patient had the first sleep study over 10 years ago - on [address]. [facility].     Sleep relevant medical history: Numbness in feet and below knee- forgetfulness - word finding delayed.  Nocturia 2-4,  Sleep walking- one episode , REM Parasomnia , Tremor, memory loss. No  RLS.    Family medical /sleep history: daughter on CPAP with OSA, no insomnia, no sleep walkers.    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 He  lives in a household with spouse,  youngest son moved back in, older son and daughter are having their own  homes.  Pets are not present. Tobacco use-  none.   ETOH use - never more than 1/months,  Caffeine intake in form of Coffee( 1 ci up in AM ) Soda( /) Tea ( /) or energy drinks. Regular exercise in form of outdoor activities. .   Hobbies :deer and [place] hunting,       Sleep habits are as follows: The patient's dinner time is between 5-6 PM. The patient goes to bed at 9.30 PM and  promptly to got to sleep for 3-4 hours, wakes for 2 bathroom breaks, the first time at 2 AM.   The second half of the night is more fragmented, he snores, he acts out dreams. Mostly around 2-4 AM   The preferred sleep position is supine ( he had right shoulder surgery)  , with the support of 2 pillows.  Dreams are reportedly  frequent/vivid.  7  AM is the usual rise time. The patient wakes up spontaneously. He  reports not feeling refreshed or restored in AM, with symptoms such as dry mouth,  sore , achiness- not feeling as if he slept much- seldomly morning headaches.  Naps are taken infrequently, lasting from 15 to 30 minutes and are refreshing .    Review of Systems: Out of a complete 14 system review, the patient complains of only the following symptoms, and all other reviewed systems are negative.:  Fatigue, sleepiness , snoring, fragmented sleep,  REM BD- parasomnia Memory  Tremor    How likely are you to doze in the following situations: 0 = not likely, 1 = slight chance, 2 = moderate chance, 3 = high chance   Sitting and Reading? Watching Television? Sitting inactive in a public place (theater or meeting)? As a passenger in a car for an hour without a break? Lying down in the afternoon when  circumstances permit? Sitting and talking to someone? Sitting quietly after lunch without alcohol? In a car, while stopped for a few minutes in traffic?   Total = 12/ 24 points   FSS endorsed at 16/ 63 points.  GDS : 2/ 15 points.   Social History   Socioeconomic History   Marital status: Married    Spouse name: Hunter Mcbride   Number of children: 3   Years of education: Not on file   Highest education level: High school graduate  Occupational History   Not on  file  Tobacco Use   Smoking status: Never    Passive exposure: Never   Smokeless tobacco: Never  Vaping Use   Vaping Use: Never used  Substance and Sexual Activity   Alcohol use: No   Drug use: No   Sexual activity: Yes  Other Topics Concern   Not on file  Social History Narrative   Lives at home with wife and temporarily young son   Right handed   Caffeine: 1 C of coffee in the AM   Social Determinants of Health   Financial Resource Strain: Not on file  Food Insecurity: Not on file  Transportation Needs: Not on file  Physical Activity: Not on file  Stress: Not on file  Social Connections: Not on file    Family History  Problem Relation Age of Onset   Colon polyps Mother    Diabetes type II Mother    Heart disease Father    Heart attack Father    Heart disease Brother    Coronary artery disease Cousin    Colon cancer Neg Hx    Pancreatic cancer Neg Hx    Esophageal cancer Neg Hx    Stomach cancer Neg Hx    Liver disease Neg Hx    Rectal cancer Neg Hx     Past Medical History:  Diagnosis Date   Arthritis    "lower back; right shoulder; left thumb; joints" (06/21/2013)   Asthma    "not sure if this is true or not" (06/21/2013)   CAD (coronary artery disease) 06/2013   s/p PTCA/DES x 3 to mid LAD 2015. b. s/p CABG in 2018. c. cath 12/2017 -> med rx.   Cancer ([place])    Clotting disorder ([place])    GERD (gastroesophageal reflux disease)    Gout    "maybe twice in my life"   Headache(784.0)    "weekly  for the last 3-4 months" (06/21/2013)   Hyperlipidemia    Hypertension    Myocardial infarction ([place])    "/Dr. Katharina Caper I've had one between 2014-2015) (06/21/2013)   Neuromuscular disorder ([place])    Osteoarthritis    Sleep apnea    WEIGHT LOSS, NO LONGER NEEDS PER PATIENT   Type II diabetes mellitus ([place])    TYPE 2    Past Surgical History:  Procedure Laterality Date   CARDIAC CATHETERIZATION  1980's   "once"   CARDIAC CATHETERIZATION N/A 07/23/2015   Procedure: Left Heart Cath and Coronary Angiography;  Surgeon: Sherren Mocha, MD;  Location: [facility];  Service: Cardiovascular;  Laterality: N/A;   CARDIOVASCULAR STRESS TEST  06/10/2008   EF 68%   CARPAL TUNNEL RELEASE Bilateral    COLONOSCOPY     CORONARY ANGIOPLASTY WITH STENT PLACEMENT  06/21/2013   "3"   CORONARY ARTERY BYPASS GRAFT N/A 11/07/2016   Procedure: CORONARY ARTERY BYPASS GRAFTING (CABG) x 5 (LIMA to DISTAL LAD, SVG to DIAGONAL, SVG to CIRCUMFLEX, and SVG SEQUENTIALLY to PLB and DISTAL PDA) with EVH of the RIGHT GREATER SAPHENOUS VEIN and LEFT INTERNAL MAMMARY ARTERY HARVEST;  Surgeon: Grace Isaac, MD;  Location: [facility];  Service: Open Heart Surgery;  Laterality: N/A;   CORONARY STENT INTERVENTION N/A 06/07/2016   Procedure: Coronary Stent Intervention;  Surgeon: Burnell Blanks, MD;  Location: [place] [facility];  Service: Cardiovascular;  Laterality: N/A;   EXCISION MORTON'S NEUROMA Left    EYE SURGERY Left    "removed film over my eye"   [place]  WIRE N/A 06/26/2013   Procedure: FRACTIONAL FLOW RESERVE WIRE;  Surgeon: [name], MD;  Location: [facility];  Service: Cardiovascular;  Laterality: N/A;   HERNIA REPAIR Left    INTRAVASCULAR PRESSURE WIRE/FFR STUDY N/A 10/28/2016   Procedure: INTRAVASCULAR PRESSURE WIRE/FFR STUDY;  Surgeon: Leonie Man, MD;  Location: [facility];  Service: Cardiovascular;  Laterality: N/A;   JOINT REPLACEMENT Left 03/2013   "thumb"    LEFT HEART CATH AND CORONARY ANGIOGRAPHY N/A 06/07/2016   Procedure: Left Heart Cath and Coronary Angiography;  Surgeon: Burnell Blanks, MD;  Location: [facility];  Service: Cardiovascular;  Laterality: N/A;   LEFT HEART CATH AND CORONARY ANGIOGRAPHY N/A 10/28/2016   Procedure: LEFT HEART CATH AND CORONARY ANGIOGRAPHY;  Surgeon: Leonie Man, MD;  Location: [facility];  Service: Cardiovascular;  Laterality: N/A;   LEFT HEART CATH AND CORS/GRAFTS ANGIOGRAPHY N/A 12/25/2017   Procedure: LEFT HEART CATH AND CORS/GRAFTS ANGIOGRAPHY;  Surgeon: Sherren Mocha, MD;  Location: [facility];  Service: Cardiovascular;  Laterality: N/A;   LEFT HEART CATH AND CORS/GRAFTS ANGIOGRAPHY N/A 07/26/2019   Procedure: LEFT HEART CATH AND CORS/GRAFTS ANGIOGRAPHY;  Surgeon: [name], MD;  Location: [facility];  Service: Cardiovascular;  Laterality: N/A;   LEFT HEART CATHETERIZATION WITH CORONARY ANGIOGRAM N/A 06/21/2013   Procedure: LEFT HEART CATHETERIZATION WITH CORONARY ANGIOGRAM;  Surgeon: Burnell Blanks, MD;  Location: [facility];  Service: Cardiovascular;  Laterality: N/A;   LEFT HEART CATHETERIZATION WITH CORONARY ANGIOGRAM N/A 06/26/2013   Procedure: LEFT HEART CATHETERIZATION WITH CORONARY ANGIOGRAM;  Surgeon: [name], MD;  Location: [facility];  Service: Cardiovascular;  Laterality: N/A;   LUMBAR LAMINECTOMY/DECOMPRESSION MICRODISCECTOMY Left 05/30/2018   Procedure: Left  Gill L5 decompression;  Surgeon: Melina Schools, MD;  Location: [facility];  Service: Orthopedics;  Laterality: Left;  170min   POLYPECTOMY     TEE WITHOUT CARDIOVERSION N/A 11/07/2016   Procedure: TRANSESOPHAGEAL ECHOCARDIOGRAM (TEE);  Surgeon: Grace Isaac, MD;  Location: [facility];  Service: Open Heart Surgery;  Laterality: N/A;   TONSILLECTOMY     TOTAL HIP ARTHROPLASTY Right 04/12/2016   Procedure: RIGHT TOTAL HIP ARTHROPLASTY ANTERIOR APPROACH;  Surgeon: Paralee Cancel, MD;  Location: [facility];  Service: Orthopedics;  Laterality: Right;  requests 70 mins   TOTAL SHOULDER ARTHROPLASTY  03/01/2012   Procedure: TOTAL SHOULDER ARTHROPLASTY;  Surgeon: Marin Shutter, MD;  Location: [facility];  Service: Orthopedics;  Laterality: Right;   UMBILICAL HERNIA REPAIR       Current Outpatient Medications on File Prior to Visit  Medication Sig Dispense Refill   acetaminophen (TYLENOL) 325 MG tablet Take 325 mg by mouth as needed.     amLODipine (NORVASC) 5 MG tablet TAKE ONE TABLET BY MOUTH TWICE DAILY 180 tablet 3   aspirin EC 81 MG tablet Take 81 mg by mouth at bedtime.      atenolol (TENORMIN) 50 MG tablet TAKE ONE TABLET BY MOUTH ONE TIME DAILY at suppertime     atorvastatin (LIPITOR) 80 MG tablet Take 1 tablet by mouth daily.     calcium carbonate (TUMS - DOSED IN MG ELEMENTAL CALCIUM) 500 MG chewable tablet Chew 2 tablets by mouth as needed for indigestion or heartburn.      ciclopirox (LOPROX) 0.77 % cream Apply topically.     clonazePAM (KLONOPIN) 1 MG tablet Take 0.5-1 tablets (0.5-1 mg total) by mouth at bedtime. 30 tablet  5   clopidogrel (PLAVIX) 75 MG tablet TAKE ONE TABLET BY MOUTH ONE TIME DAILY WITH BREAKFAST 90 tablet 0   docusate sodium (COLACE) 100 MG capsule Take 100 mg by mouth 2 (two) times daily.     empagliflozin (JARDIANCE) 25 MG TABS tablet Take by mouth daily.     famotidine (PEPCID) 40 MG tablet Take 1 tablet (40 mg total) by mouth 2 (two) times daily. 60 tablet 5   FREESTYLE TEST STRIPS test strip 1 each by Other route every morning.      gabapentin (NEURONTIN) 300 MG capsule Take 300 mg by mouth daily.     hydrochlorothiazide (HYDRODIURIL) 25 MG tablet Take 1 tablet by mouth every morning.     irbesartan (AVAPRO) 300 MG tablet Take 300 mg by mouth at bedtime.      isosorbide mononitrate (IMDUR) 30 MG 24 hr tablet Take 1 tablet by mouth once daily 90 tablet 3   metFORMIN (GLUCOPHAGE-XR) 500 MG 24 hr tablet Take 1 tablet (500 mg total) by mouth  2 (two) times daily.     mupirocin ointment (BACTROBAN) 2 % Apply to affected area three times a day as needed     Naftifine HCl 2 % CREA APPLY TO AFFECTED AREA(S) TWICE A DAY FOR YEAST     nitroGLYCERIN (NITROSTAT) 0.4 MG SL tablet DISSOLVE 1 TABLET UNDER TONGUE AS NEEDEDFOR CHEST PAIN. MAY REPEAT 5 MINUTES APART 3 TIMES IF NEEDED 25 tablet 3   pantoprazole (PROTONIX) 40 MG tablet Take 1 tablet by mouth 2 (two) times daily.     polyethylene glycol (MIRALAX / GLYCOLAX) 17 g packet Take 17 g by mouth daily.     promethazine (PHENERGAN) 12.5 MG tablet 1 tablet as needed     ranolazine (RANEXA) 500 MG 12 hr tablet TAKE ONE TABLET BY MOUTH TWICE DAILY 180 tablet 2   TRESIBA FLEXTOUCH 100 UNIT/ML FlexTouch Pen Inject 18 Units into the skin daily.     triamcinolone cream (KENALOG) 0.1 % Apply 1 application topically as needed.     Vitamin D, Cholecalciferol, 25 MCG (1000 UT) CAPS Take 1 capsule by mouth daily.     No current facility-administered medications on file prior to visit.    Allergies  Allergen Reactions   Hydrocodone Nausea Only    Other reaction(s): nauseated Other reaction(s): nauseated Other reaction(s): nauseated Other reaction(s): nauseated   Methocarbamol Nausea Only    Other reaction(s): nauseated Other reaction(s): nauseated Other reaction(s): nauseated Other reaction(s): nauseated   Oxycodone Nausea Only  HBa1c; 6.8   Physical exam:  Today's Vitals   09/27/21 0947  BP: 137/74  Pulse: 85  Weight: 198 lb (89.8 kg)  Height: 5\' 9"  (1.753 m)   Body mass index is 29.24 kg/m.   Wt Readings from Last 3 Encounters:  09/27/21 198 lb (89.8 kg)  08/02/21 197 lb 9.6 oz (89.6 kg)  05/20/21 194 lb (88 kg)     Ht Readings from Last 3 Encounters:  09/27/21 5\' 9"  (1.753 m)  08/02/21 5\' 9"  (1.753 m)  05/20/21 5\' 9"  (1.753 m)      General: The patient is awake, alert and appears not in acute distress. The patient is well groomed. Head: Normocephalic, atraumatic.   Neck is supple. Mallampati 2,  neck circumference:17 inches . Nasal airflow  patent. Retrognathia is  seen.  Dental status: biological  Tremor in the tongue.  Cardiovascular:  Regular rate and cardiac rhythm by pulse,  without distended neck veins. Respiratory: Lungs are  clear to auscultation.  Skin:  Without evidence of ankle edema, or rash. Trunk: The patient's posture is erect.   Neurologic exam : The patient is awake and alert, oriented to place and time.   Memory subjective described as impaired .  Attention span & concentration ability appears reduced. He is friendly, interactive,  Speech is fluent,  without dysarthria, but with mild dysphonia. He is now looking for words and names- aphasia.   Mood and affect are appropriate. He is very outgoing.    Cranial nerves: no loss of smell or taste reported  Pupils are equal and briskly reactive to light. Funduscopic exam deferred. .  Extraocular movements in vertical and horizontal planes were intact and without nystagmus. No Diplopia. Visual fields by finger perimetry are intact. Hearing was intact to soft voice and finger rubbing.    Facial sensation intact to fine touch.  Facial motor strength is symmetric and tongue and uvula move midline.  Neck ROM : rotation, tilt and flexion extension were normal for age and shoulder shrug was symmetrical.    Motor exam:  Symmetric bulk, and ROM. Strength of hip flexion, adduction, abduction, knee extension is intact  pronator drift left, shoulder ROM restricted in both sids.  Increased tone on right biceps with rigor. Cogwheeling on both sides!  symmetric grip strength .   Sensory:  Fine touch and vibration were  normal.  Coordination: Rapid alternating movements in the fingers/hands were of normal speed.  The Finger-to-nose maneuver was intact with evidence of action tremor, not resting tremor .    Gait and station: Patient could rise unassisted from a seated position, walked without  assistive device.  Stance is of normal width/ base and the patient turned with 3 steps.  Toe and heel walk were deferred.  Deep tendon reflexes: in the  upper and lower extremities are symmetric and intact. Brisk in upper extremities.  Babinski response was deferred.         After spending a total time of 38 minutes: including outside records review,  face to face and additional time for physical and neurologic examination, review of laboratory studies, MEMORY TESTING personal review of imaging studies, reports and results of other testing and review of referral information / records as far as provided in visit, I have established the following assessments:  1) Dream enactment , clearly has improved initially on meds, but then will return.  2) tremor , tongue and hands, and legs. But no weakness. Cogwheeling.  3) Neuropathy, loss of vibration in knees and below, DM.  4) drop foot on the left, status post back surgery. 5) short term memory loss, loss of words.      He tried Melatonin and it worked for 8 weeks, then we had low dose klonopin 0.25 mg nightly, and he again benefited in terms of less sleep action , but he didn 't like the effects- klonopin made him groggy, tired and depressed.  He has not been as active in his sleep- we are considering reducing the klonopin again,  to 0. 25 mg and adding ZOLOFT 25 mg. He would rather go back on Xanax which has been served him as a sleep aid.  He has bilateral biceps cogwheeling and hoarse voice but fluent EOM.   My Plan is to proceed with:  1) RV in 4-6 months - if he still feels terrible on Klonopin at only 0.25 mg nightly , I will allow him going back on Xanax-  2) Zoloft 25 mg  added. REM suppression.  3) MCI by [facility]  he is in the range of MCI- denies visual hallucinations. I ordered an MRI Brain with and without contrast. If normal, will check for metabolic PET or DAT can in the future.  4) chronic nausea from metformin. Taking phenergan  for nausea will leave him sleepier, too. I like him to get of Phenergan. He has cogwheeling in both biceps and wouldn't use an additional dopamine receptor drug.     I would like to thank Hunter Infante, MD and Hunter Mcbride, [address] [place],  [place] [address] for allowing me to meet with and to take care of this pleasant patient.   In short, Hunter Mcbride. is presenting with REM BD . My goal is to screen this patient for the possibility of REM sleep behavior disorder announcing any kind of Parkinson syndrome or Lewy body disease. REM sleep somniloquism.    I plan to follow up either personally or through our NP within 4-6 months.   CC: I will share my notes with PCP.   Electronically signed by: Hunter Seat, MD 09/27/2021 10:10 AM  [facility] Board certified by The [employer] of Sleep Medicine and Diplomate of the [employer] of Sleep Medicine. Board certified In Neurology through the [place], Fellow of the [employer] of Neurology. Medical Director of [facility].

## 2021-09-28 ENCOUNTER — Telehealth: Payer: Self-pay | Admitting: Neurology

## 2021-09-28 LAB — BASIC METABOLIC PANEL WITH GFR
BUN/Creatinine Ratio: 23 (ref 10–24)
BUN: 26 mg/dL (ref 8–27)
CO2: 23 mmol/L (ref 20–29)
Calcium: 10 mg/dL (ref 8.6–10.2)
Chloride: 103 mmol/L (ref 96–106)
Creatinine, Ser: 1.13 mg/dL (ref 0.76–1.27)
Glucose: 274 mg/dL — ABNORMAL HIGH (ref 70–99)
Potassium: 4.5 mmol/L (ref 3.5–5.2)
Sodium: 142 mmol/L (ref 134–144)
eGFR: 68 mL/min/{1.73_m2}

## 2021-09-28 NOTE — Telephone Encounter (Signed)
HTA order sent to GI, NPR they will reach out to the patient to schedule.  

## 2021-09-28 NOTE — Progress Notes (Signed)
Non-fasting CMET= Glucose is elevated in non fast sample. BUN is high, indicating insufficient hydration.

## 2021-09-29 ENCOUNTER — Telehealth: Payer: Self-pay | Admitting: Neurology

## 2021-09-29 NOTE — Telephone Encounter (Signed)
Called the patient and advised that the lab results indicating glucose was elevated. Informed that the patient that BUN was on the higher side as well. Encouraged the pt to make sure taking plenty water intake and staying hydrated. Pt verbalized understanding.

## 2021-09-29 NOTE — Telephone Encounter (Signed)
-----   Message from Larey Seat, MD sent at 09/28/2021  6:12 PM EDT ----- Non-fasting CMET= Glucose is elevated in non fast sample. BUN is high, indicating insufficient hydration.

## 2021-10-04 ENCOUNTER — Other Ambulatory Visit: Payer: Self-pay

## 2021-10-04 MED ORDER — NITROGLYCERIN 0.4 MG SL SUBL
SUBLINGUAL_TABLET | SUBLINGUAL | 11 refills | Status: DC
Start: 2021-10-04 — End: 2023-05-30

## 2021-10-05 ENCOUNTER — Ambulatory Visit
Admission: RE | Admit: 2021-10-05 | Discharge: 2021-10-05 | Disposition: A | Discharge status: Home / Self Care | Payer: PPO | Source: Ambulatory Visit | Attending: Neurology | Admitting: Neurology

## 2021-10-05 DIAGNOSIS — R29898 Other symptoms and signs involving the musculoskeletal system: Secondary | ICD-10-CM | POA: Diagnosis not present

## 2021-10-05 DIAGNOSIS — G3184 Mild cognitive impairment, so stated: Secondary | ICD-10-CM

## 2021-10-05 DIAGNOSIS — G4752 REM sleep behavior disorder: Secondary | ICD-10-CM

## 2021-10-05 MED ORDER — GADOBENATE DIMEGLUMINE 529 MG/ML IV SOLN
20.0000 mL | Freq: Once | INTRAVENOUS | Status: AC | PRN
  Administered 2021-10-05: 20 mL via INTRAVENOUS

## 2021-10-06 ENCOUNTER — Telehealth: Payer: Self-pay | Admitting: Neurology

## 2021-10-06 ENCOUNTER — Other Ambulatory Visit: Payer: Self-pay | Admitting: Neurology

## 2021-10-06 NOTE — Telephone Encounter (Signed)
Called the pt's wife back and clarified per the last ov note. The patient was to take 0.25 mg klonopin and 12.5 mg of the sertaline and see how he did. He did that and continued to have concerns r/t feeling like a zombie and not liking the way he felt. Dr Joylene Draft previously wrote for xanax for him and they are going to switch back to that. At this time I have taken both of the other medications off the list and I will also notify [facility] and let them know as they had reached out earlier this morning questioning this.  Called and spoke with the pharmacist and confirmed they should remove both klonopin and the zoloft off of the patient's medication list. Pt will no longer be taking those. I gave ok for them to move forward with the xanax script from Dr Sherrye Payor. She was appreciative for the follow up

## 2021-10-06 NOTE — Telephone Encounter (Signed)
Pt's wife called stating that the pt has tried  clonazePAM (KLONOPIN) 0.5 MG tablet and the sertraline (ZOLOFT) 25 MG tablet for several days but had him walking around the house like a "Zombie". Pt would like to get off of both medications and go back to the Xanax. Please advise.

## 2021-10-08 DIAGNOSIS — G3184 Mild cognitive impairment, so stated: Secondary | ICD-10-CM | POA: Diagnosis not present

## 2021-10-08 DIAGNOSIS — R29898 Other symptoms and signs involving the musculoskeletal system: Secondary | ICD-10-CM | POA: Diagnosis not present

## 2021-10-08 DIAGNOSIS — G4752 REM sleep behavior disorder: Secondary | ICD-10-CM | POA: Diagnosis not present

## 2021-10-11 ENCOUNTER — Telehealth: Payer: Self-pay | Admitting: Neurology

## 2021-10-11 NOTE — Addendum Note (Signed)
Addended by: Larey Seat on: 10/11/2021 11:40 AM   Modules accepted: Orders

## 2021-10-11 NOTE — Telephone Encounter (Signed)
Pt's wife LVM wanting to know if MRI results have come in. She would like a call back.

## 2021-10-12 ENCOUNTER — Telehealth: Payer: Self-pay | Admitting: Neurology

## 2021-10-12 NOTE — Telephone Encounter (Signed)
LVM and sent mychart msg informing pt of appointment change from 02/28/22 to 03/10/22 - MD out

## 2021-10-12 NOTE — Telephone Encounter (Signed)
Called the patient' wife and reviewed the results. Advised of the findings and informed the wife that Dr Brett Fairy would like to pursue a PET scan to further look at area of the brain that could be affected. Wife verbalized understanding and had no further questions at this time. She will await to hear to get him scheduled for the PET scan.    "MRI brain with and without contrast demonstrating:  -Mild perisylvian atrophy- this can correlate to memory loss disorders.  -Benign left infra putaminal cyst measuring 1.4 cm.  -No acute findings   Consider PET scan next :  we have a setting of REM Behavior disorder, cogwheeling, and memory impairment ( MOCA 25/ 30) 09-27-2021."

## 2021-10-13 ENCOUNTER — Telehealth: Payer: Self-pay | Admitting: Neurology

## 2021-10-13 NOTE — Telephone Encounter (Signed)
[employer] no auth required sent to [facility] for scheduling. [phone]

## 2021-10-20 ENCOUNTER — Other Ambulatory Visit: Payer: Self-pay | Admitting: Cardiovascular Disease

## 2021-10-20 DIAGNOSIS — R7989 Other specified abnormal findings of blood chemistry: Secondary | ICD-10-CM | POA: Diagnosis not present

## 2021-10-20 DIAGNOSIS — E785 Hyperlipidemia, unspecified: Secondary | ICD-10-CM | POA: Diagnosis not present

## 2021-10-20 DIAGNOSIS — E1169 Type 2 diabetes mellitus with other specified complication: Secondary | ICD-10-CM | POA: Diagnosis not present

## 2021-10-20 DIAGNOSIS — Z125 Encounter for screening for malignant neoplasm of prostate: Secondary | ICD-10-CM | POA: Diagnosis not present

## 2021-10-20 DIAGNOSIS — E291 Testicular hypofunction: Secondary | ICD-10-CM | POA: Diagnosis not present

## 2021-10-20 DIAGNOSIS — Z0000 Encounter for general adult medical examination without abnormal findings: Secondary | ICD-10-CM | POA: Diagnosis not present

## 2021-10-20 DIAGNOSIS — R5383 Other fatigue: Secondary | ICD-10-CM | POA: Diagnosis not present

## 2021-10-20 DIAGNOSIS — I10 Essential (primary) hypertension: Secondary | ICD-10-CM | POA: Diagnosis not present

## 2021-10-21 DIAGNOSIS — R82998 Other abnormal findings in urine: Secondary | ICD-10-CM | POA: Diagnosis not present

## 2021-10-22 ENCOUNTER — Ambulatory Visit (HOSPITAL_COMMUNITY)
Admission: RE | Admit: 2021-10-22 | Discharge: 2021-10-22 | Disposition: A | Discharge status: Home / Self Care | Payer: PPO | Source: Ambulatory Visit | Attending: Neurology | Admitting: Neurology

## 2021-10-22 DIAGNOSIS — F0390 Unspecified dementia without behavioral disturbance: Secondary | ICD-10-CM | POA: Diagnosis not present

## 2021-10-22 DIAGNOSIS — G309 Alzheimer's disease, unspecified: Secondary | ICD-10-CM | POA: Insufficient documentation

## 2021-10-22 LAB — GLUCOSE, CAPILLARY: Glucose-Capillary: 77 mg/dL (ref 70–99)

## 2021-10-22 MED ORDER — FLUDEOXYGLUCOSE F - 18 (FDG) INJECTION
10.0000 | Freq: Once | INTRAVENOUS | Status: AC
  Administered 2021-10-22: 9.97 via INTRAVENOUS

## 2021-10-24 NOTE — Progress Notes (Signed)
Great capillary glucose, will await PET scan.

## 2021-10-26 ENCOUNTER — Encounter: Payer: Self-pay | Admitting: Podiatry

## 2021-10-26 ENCOUNTER — Telehealth: Payer: Self-pay | Admitting: *Deleted

## 2021-10-26 NOTE — Telephone Encounter (Signed)
LVM for pt about results per Dr. Edwena Felty note. Reminded him of appt on 11/29/21. Advised he can discuss further at this appointment.

## 2021-10-26 NOTE — Telephone Encounter (Signed)
-----   Message from Larey Seat, MD sent at 10/25/2021  5:19 PM EDT ----- Mild bilateral decreased hypermetabolism within the parietotemporal underlying cortices with normal cortical uptake in the frontal lobes. Which may reflect mild cognitive impairment versus or early Alzheimer's type dementia.  Will treat as Alzheimer's dementia.

## 2021-10-27 NOTE — Telephone Encounter (Signed)
Pt is asking that the results be sent to Dr Joylene Draft for the upcoming appointment pt has with him on Monday of next week

## 2021-11-01 DIAGNOSIS — Z1339 Encounter for screening examination for other mental health and behavioral disorders: Secondary | ICD-10-CM | POA: Diagnosis not present

## 2021-11-01 DIAGNOSIS — I2510 Atherosclerotic heart disease of native coronary artery without angina pectoris: Secondary | ICD-10-CM | POA: Diagnosis not present

## 2021-11-01 DIAGNOSIS — J45909 Unspecified asthma, uncomplicated: Secondary | ICD-10-CM | POA: Diagnosis not present

## 2021-11-01 DIAGNOSIS — Z794 Long term (current) use of insulin: Secondary | ICD-10-CM | POA: Diagnosis not present

## 2021-11-01 DIAGNOSIS — I209 Angina pectoris, unspecified: Secondary | ICD-10-CM | POA: Diagnosis not present

## 2021-11-01 DIAGNOSIS — Z0000 Encounter for general adult medical examination without abnormal findings: Secondary | ICD-10-CM | POA: Diagnosis not present

## 2021-11-01 DIAGNOSIS — R413 Other amnesia: Secondary | ICD-10-CM | POA: Diagnosis not present

## 2021-11-01 DIAGNOSIS — Z1331 Encounter for screening for depression: Secondary | ICD-10-CM | POA: Diagnosis not present

## 2021-11-01 DIAGNOSIS — E1169 Type 2 diabetes mellitus with other specified complication: Secondary | ICD-10-CM | POA: Diagnosis not present

## 2021-11-01 DIAGNOSIS — D7589 Other specified diseases of blood and blood-forming organs: Secondary | ICD-10-CM | POA: Diagnosis not present

## 2021-11-01 DIAGNOSIS — E785 Hyperlipidemia, unspecified: Secondary | ICD-10-CM | POA: Diagnosis not present

## 2021-11-01 DIAGNOSIS — I10 Essential (primary) hypertension: Secondary | ICD-10-CM | POA: Diagnosis not present

## 2021-11-02 ENCOUNTER — Ambulatory Visit: Payer: PPO | Admitting: Podiatry

## 2021-11-09 ENCOUNTER — Ambulatory Visit: Payer: PPO | Admitting: Podiatry

## 2021-11-09 ENCOUNTER — Encounter: Payer: Self-pay | Admitting: Podiatry

## 2021-11-09 DIAGNOSIS — B351 Tinea unguium: Secondary | ICD-10-CM

## 2021-11-09 DIAGNOSIS — M79676 Pain in unspecified toe(s): Secondary | ICD-10-CM

## 2021-11-09 DIAGNOSIS — E1142 Type 2 diabetes mellitus with diabetic polyneuropathy: Secondary | ICD-10-CM

## 2021-11-09 NOTE — Progress Notes (Signed)
  Subjective:  Patient ID: Hunter Mcbride., male    DOB: [birth year],   MRN: [id number]  Chief Complaint  Patient presents with   Foot Problem    DFC     76 y.o. male presents for concern of thickened elongated and painful nails that are difficult to trim. Requesting to have them trimmed today. Relates burning and tingling in their feet. Patient is diabetic and last A1c was  Lab Results  Component Value Date   HGBA1C 7.3 (H) 07/26/2019   .   PCP:  Crist Infante, MD    . Denies any other pedal complaints. Denies n/v/f/c.   Past Medical History:  Diagnosis Date   Arthritis    "lower back; right shoulder; left thumb; joints" (06/21/2013)   Asthma    "not sure if this is true or not" (06/21/2013)   CAD (coronary artery disease) 06/2013   s/p PTCA/DES x 3 to mid LAD 2015. b. s/p CABG in 2018. c. cath 12/2017 -> med rx.   Cancer ([place])    Clotting disorder ([place])    GERD (gastroesophageal reflux disease)    Gout    "maybe twice in my life"   Headache(784.0)    "weekly for the last 3-4 months" (06/21/2013)   Hyperlipidemia    Hypertension    Myocardial infarction ([place])    "/Dr. Carlton Adam had one between 2014-2015) (06/21/2013)   Neuromuscular disorder ([place])    Osteoarthritis    Sleep apnea    WEIGHT LOSS, NO LONGER NEEDS PER PATIENT   Type II diabetes mellitus ([facility])    TYPE 2    Objective:  Physical Exam: Vascular: DP/PT pulses 2/4 bilateral. CFT <3 seconds. Normal hair growth on digits. No edema.  Skin. No lacerations or abrasions bilateral feet. Left hallux nail bed healing well. Nails 1-5 bilateral are thickened elongated and with subungual debris.  Musculoskeletal: MMT 5/5 bilateral lower extremities in DF, PF, Inversion and Eversion. Deceased ROM in DF of ankle joint. No pain to palpation of the foot.  Neurological: Sensation intact to light touch. Protective sensation intact b/l.   Assessment:   1. Pain due to onychomycosis of toenail   2. Diabetic peripheral neuropathy  associated with type 2 diabetes mellitus ([place])      Plan:  Patient was evaluated and treated and all questions answered. -Discussed and educated patient on diabetic foot care, especially with  regards to the vascular, neurological and musculoskeletal systems.  -Stressed the importance of good glycemic control and the detriment of not  controlling glucose levels in relation to the foot. -Discussed supportive shoes at all times and checking feet regularly.  -Mechanically debrided all nails 1-5 bilateral using sterile nail nipper and filed with dremel without incident  -Answered all patient questions -Patient to return  in 3 months for at risk foot care -Patient advised to call the office if any problems or questions arise in the meantime.    Lorenda Peck, DPM

## 2021-11-29 ENCOUNTER — Encounter: Payer: Self-pay | Admitting: Neurology

## 2021-11-29 ENCOUNTER — Ambulatory Visit: Payer: PPO | Admitting: Neurology

## 2021-11-29 VITALS — BP 126/76 | Ht 69.0 in | Wt 199.0 lb

## 2021-11-29 DIAGNOSIS — R251 Tremor, unspecified: Secondary | ICD-10-CM

## 2021-11-29 DIAGNOSIS — G3184 Mild cognitive impairment, so stated: Secondary | ICD-10-CM

## 2021-11-29 DIAGNOSIS — F515 Nightmare disorder: Secondary | ICD-10-CM

## 2021-11-29 DIAGNOSIS — R9402 Abnormal brain scan: Secondary | ICD-10-CM

## 2021-11-29 MED ORDER — GALANTAMINE HYDROBROMIDE ER 8 MG PO CP24
8.0000 mg | ORAL_CAPSULE | Freq: Every morning | ORAL | 3 refills | Status: DC
Start: 2021-11-29 — End: 2022-03-07

## 2021-11-29 NOTE — Addendum Note (Signed)
Addended by: Larey Seat on: 11/29/2021 11:37 AM   Modules accepted: Orders

## 2021-11-29 NOTE — Progress Notes (Addendum)
SLEEP MEDICINE CLINIC    Provider:  Larey Seat, MD  Primary Care Physician:  Crist Infante, MD [place] [address]     Referring Provider: Crist Infante, [place],  [place] [address]          Chief Complaint according to patient   Patient presents with:             Pt in room #10 with his wife and daughter. Pt here toady for f/u MRI review and memory.   HISTORY OF PRESENT ILLNESS:  Hunter Mcbride. is a 76 y.o. Caucasian male patient seen herein a RV on 11/29/2021 ,  PET scan was positive for perisylvian atrophy, and PET was positive for temporal distribution of hypometabolism.  Vit B12 level was checked by Dr. Joylene Draft, started galantamine, but  patient found out that it is 4th tier. His insurance wants him on Aricept, which he is unlikely to tolerate.   The patient has at baseline nausea, metformin caused this. Has low BP and lower heart rate (50 bpm as resting heart rate ) Compared with low-dose donepezil, galantamine use was associated with a lower risk of mortality (adjusted hazard ratio: 0.84, 95% confidence interval: 0.60-1.18), cardiovascular serious adverse events (adjusted hazard ratio: 0.78, 95% confidence interval: 0.62-0.98), and entry into a residential care facility.  He is doing well, MOCA today 26/ 30 points. I would like to add a phosphorylated tau blood test, have him read the MIND diet and watch the " blue zone " documentary.     Here from Dr Joylene Draft for a REM Sleep Behavior Disorder) he underwent a sleep study  07-11-2021- .  The patient had a total of 141 Limb Movements.  The Periodic Limb Movement (PLM) index was 19.1 and the PLM Arousal index was 0.4/hour. Few of these limb movements were noted in REM but there were movements in REM as well as sleep talking. PLMs were clustered in the time between 21.45 and 23.00 hours.  the patient slept on his back during that time. Audio and video analysis do show movements in REM  , behaviors, phonations and vocalizations.   EKG was in keeping with normal sinus rhythm (NSR).  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 REM sleep somniloquism.   RECOMMENDATIONS:  1)if he still feels terrible on Klonopin at only 0.25 mg nightly , I will allow him going back on Xanax-  2) Zoloft 25 mg added. REM suppression.  3) MCI by [facility]  he is in the range of MCI- denies visual hallucinations.  I ordered an MRI Brain with and without contrast. If normal, will check for metabolic PET or DAT can in the future.  4) chronic nausea from metformin. Taking phenergan for nausea will leave him sleepier, too. I like him to get off Phenergan. He has cogwheeling in both biceps and wouldn't use an additional dopamine receptor drug.     Treatment for REM BD includes melatonin, and often requires Klonopin as well.  Our follow up appointment will be directed towards those disorders associated with REM BD, such as Parkinson's disease and Lewy body disease.   He tried Melatonin and it worked for 8 weeks, then we had low dose klonopin 0.25 mg nightly, and he again benefited in terms of less sleep action , but he didn 't like the effects-  klonopin made him groggy, tired and depressed.  He has not been as active in his sleep- we are considering reducing the klonopin again,  to 0. 25 mg and adding ZOLOFT 25 mg. He would rather go back on Xanax which has been served him as a sleep aid.   We also meet to obtain a memory test:    11/29/2021   10:30 AM 09/27/2021   10:03 AM 05/20/2021   11:36 AM  Montreal Cognitive Assessment   Visuospatial/ Executive (0/5) 4 3 4   Naming (0/3) 3 3 3   Attention: Read list of digits (0/2) 2 2 2   Attention: Read list of letters (0/1) 1 1 1   Attention: Serial 7 subtraction starting at 100 (0/3) 3 1 3   Language: Repeat phrase (0/2) 2 2 2   Language :  Fluency (0/1) 1 1 1   Abstraction (0/2) 2 2 2   Delayed Recall (0/5) 2 4 4   Orientation (0/6) 6 6 5   Total 26 25 27        05-30-2021: CONSULT Chief concern according to patient:  Rm 11, w wife [name]. Pt referred by Crist Infante for sleep consult. Pt having vivid dreams, yelling and taking, limb movement, and poor sleep overall. During these events pt has no memory of these events. Pt tends to cure in his dreams. Has gotten out of bed one night and kept walking into the wall thinking theres a door opening. Ongoing for a year and worsened last 6 months. Not every night. Usually sitting on the bed and always grabbing or defending himself, cussing and  agitation. He left the bed only one time, trying to walk through the bedroom wall, he had dreamt he was in his childhood home. He has started to have tremors.  Last PSG over 10 years ago, and apnea was found at the time- he no longer used CPAP after weight loss.    He has a past medical history of Arthritis, Asthma, CAD (coronary artery disease) (06/2013), Cancer ([place]), Clotting disorder ([place]), GERD (gastroesophageal reflux disease), Gout, Headache(784.0), Hyperlipidemia, Hypertension, Myocardial infarction [facility]), Neuromuscular disorder ([place]), Osteoarthritis, Sleep apnea, and Type II diabetes mellitus (JAARS).   The patient had the first sleep study over 10 years ago - on [address]. [facility].   Sleep relevant medical history: Numbness in feet and below knee- forgetfulness - word finding delayed.  Nocturia 2-4,  Sleep walking- one episode , REM Parasomnia , Tremor, memory loss. No RLS.    Family medical /sleep history: daughter on CPAP with OSA, no insomnia, no sleep walkers.    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 He  lives in a household with spouse,  youngest son moved back in, older son and daughter are having their own  homes.  Pets are not present. Tobacco use-  none.   ETOH use -  never more than 1/months,  Caffeine intake in form of Coffee( 1 ci up in AM ) Soda( /) Tea ( /) or energy drinks. Regular exercise in form of outdoor activities.   Hobbies :deer and [place] hunting,       Sleep habits are as follows: The patient's dinner time is between 5-6 PM. The patient goes to bed at 9.30 PM and  promptly to got to sleep for 3-4 hours, wakes for 2 bathroom breaks, the first time at 2 AM.   The second half of the night is more fragmented, he snores, he acts out dreams. Mostly  around 2-4 AM   The preferred sleep position is supine ( he had right shoulder surgery)  , with the support of 2 pillows.  Dreams are reportedly  frequent/vivid.  7  AM is the usual rise time. The patient wakes up spontaneously. He  reports not feeling refreshed or restored in AM, with symptoms such as dry mouth,  sore , achiness- not feeling as if he slept much- seldomly morning headaches.  Naps are taken infrequently, lasting from 15 to 30 minutes and are refreshing .    Review of Systems: Out of a complete 14 system review, the patient complains of only the following symptoms, and all other reviewed systems are negative.:  Fatigue, sleepiness , snoring, fragmented sleep,  REM BD- parasomnia Memory  Tremor    How likely are you to doze in the following situations: 0 = not likely, 1 = slight chance, 2 = moderate chance, 3 = high chance   Sitting and Reading? Watching Television? Sitting inactive in a public place (theater or meeting)? As a passenger in a car for an hour without a break? Lying down in the afternoon when circumstances permit? Sitting and talking to someone? Sitting quietly after lunch without alcohol? In a car, while stopped for a few minutes in traffic?   Total =  15 from 12/ 24 points   takes power naps.   FSS endorsed at  14 from 16/ 63 points.  GDS : 2/ 15 points.   Social History   Socioeconomic History   Marital status: Married    Spouse name: [name]   Number  of children: 3   Years of education: Not on file   Highest education level: High school graduate  Occupational History   Not on file  Tobacco Use   Smoking status: Never    Passive exposure: Never   Smokeless tobacco: Never  Vaping Use   Vaping Use: Never used  Substance and Sexual Activity   Alcohol use: No   Drug use: No   Sexual activity: Yes  Other Topics Concern   Not on file  Social History Narrative   Lives at home with wife and temporarily young son   Right handed   Caffeine: 1 C of coffee in the AM   Social Determinants of Health   Financial Resource Strain: Not on file  Food Insecurity: Not on file  Transportation Needs: Not on file  Physical Activity: Not on file  Stress: Not on file  Social Connections: Not on file    Family History  Problem Relation Age of Onset   Colon polyps Mother    Diabetes type II Mother    Heart disease Father    Heart attack Father    Heart disease Brother    Coronary artery disease Cousin    Colon cancer Neg Hx    Pancreatic cancer Neg Hx    Esophageal cancer Neg Hx    Stomach cancer Neg Hx    Liver disease Neg Hx    Rectal cancer Neg Hx     Past Medical History:  Diagnosis Date   Arthritis    "lower back; right shoulder; left thumb; joints" (06/21/2013)   Asthma    "not sure if this is true or not" (06/21/2013)   CAD (coronary artery disease) 06/2013   s/p PTCA/DES x 3 to mid LAD 2015. b. s/p CABG in 2018. c. cath 12/2017 -> med rx.   Cancer [facility])    Clotting disorder ([place])  GERD (gastroesophageal reflux disease)    Gout    "maybe twice in my life"   Headache(784.0)    "weekly for the last 3-4 months" (06/21/2013)   Hyperlipidemia    Hypertension    Myocardial infarction ([place])    "/Dr. Carlton Adam had one between 2014-2015) (06/21/2013)   Neuromuscular disorder ([place])    Osteoarthritis    Sleep apnea    WEIGHT LOSS, NO LONGER NEEDS PER PATIENT   Type II diabetes mellitus ([place])    TYPE 2    Past Surgical  History:  Procedure Laterality Date   CARDIAC CATHETERIZATION  1980's   "once"   CARDIAC CATHETERIZATION N/A 07/23/2015   Procedure: Left Heart Cath and Coronary Angiography;  Surgeon: Sherren Mocha, MD;  Location: [place] [facility];  Service: Cardiovascular;  Laterality: N/A;   CARDIOVASCULAR STRESS TEST  06/10/2008   EF 68%   CARPAL TUNNEL RELEASE Bilateral    COLONOSCOPY     CORONARY ANGIOPLASTY WITH STENT PLACEMENT  06/21/2013   "3"   CORONARY ARTERY BYPASS GRAFT N/A 11/07/2016   Procedure: CORONARY ARTERY BYPASS GRAFTING (CABG) x 5 (LIMA to DISTAL LAD, SVG to DIAGONAL, SVG to CIRCUMFLEX, and SVG SEQUENTIALLY to PLB and DISTAL PDA) with EVH of the RIGHT GREATER SAPHENOUS VEIN and LEFT INTERNAL MAMMARY ARTERY HARVEST;  Surgeon: Grace Isaac, MD;  Location: [place];  Service: Open Heart Surgery;  Laterality: N/A;   CORONARY STENT INTERVENTION N/A 06/07/2016   Procedure: Coronary Stent Intervention;  Surgeon: Burnell Blanks, MD;  Location: [place] [facility];  Service: Cardiovascular;  Laterality: N/A;   EXCISION MORTON'S NEUROMA Left    EYE SURGERY Left    "removed film over my eye"   FRACTIONAL FLOW RESERVE WIRE N/A 06/26/2013   Procedure: [place];  Surgeon: [name], MD;  Location: [place] [facility];  Service: Cardiovascular;  Laterality: N/A;   HERNIA REPAIR Left    INTRAVASCULAR PRESSURE WIRE/FFR STUDY N/A 10/28/2016   Procedure: INTRAVASCULAR PRESSURE WIRE/FFR STUDY;  Surgeon: Leonie Man, MD;  Location: [place] [facility];  Service: Cardiovascular;  Laterality: N/A;   JOINT REPLACEMENT Left 03/2013   "thumb"   LEFT HEART CATH AND CORONARY ANGIOGRAPHY N/A 06/07/2016   Procedure: Left Heart Cath and Coronary Angiography;  Surgeon: Burnell Blanks, MD;  Location: [place] [facility];  Service: Cardiovascular;  Laterality: N/A;   LEFT HEART CATH AND CORONARY ANGIOGRAPHY N/A 10/28/2016   Procedure: LEFT HEART CATH AND CORONARY  ANGIOGRAPHY;  Surgeon: Leonie Man, MD;  Location: [place] [facility];  Service: Cardiovascular;  Laterality: N/A;   LEFT HEART CATH AND CORS/GRAFTS ANGIOGRAPHY N/A 12/25/2017   Procedure: LEFT HEART CATH AND CORS/GRAFTS ANGIOGRAPHY;  Surgeon: Sherren Mocha, MD;  Location: [place] [facility];  Service: Cardiovascular;  Laterality: N/A;   LEFT HEART CATH AND CORS/GRAFTS ANGIOGRAPHY N/A 07/26/2019   Procedure: LEFT HEART CATH AND CORS/GRAFTS ANGIOGRAPHY;  Surgeon: [name], MD;  Location: [place] [facility];  Service: Cardiovascular;  Laterality: N/A;   LEFT HEART CATHETERIZATION WITH CORONARY ANGIOGRAM N/A 06/21/2013   Procedure: LEFT HEART CATHETERIZATION WITH CORONARY ANGIOGRAM;  Surgeon: Burnell Blanks, MD;  Location: [facility];  Service: Cardiovascular;  Laterality: N/A;   LEFT HEART CATHETERIZATION WITH CORONARY ANGIOGRAM N/A 06/26/2013   Procedure: LEFT HEART CATHETERIZATION WITH CORONARY ANGIOGRAM;  Surgeon: [name], MD;  Location: [place] [facility];  Service: Cardiovascular;  Laterality: N/A;   LUMBAR LAMINECTOMY/DECOMPRESSION MICRODISCECTOMY  Left 05/30/2018   Procedure: Left  Gill L5 decompression;  Surgeon: Melina Schools, MD;  Location: [place];  Service: Orthopedics;  Laterality: Left;  176min   POLYPECTOMY     TEE WITHOUT CARDIOVERSION N/A 11/07/2016   Procedure: TRANSESOPHAGEAL ECHOCARDIOGRAM (TEE);  Surgeon: Grace Isaac, MD;  Location: [place];  Service: Open Heart Surgery;  Laterality: N/A;   TONSILLECTOMY     TOTAL HIP ARTHROPLASTY Right 04/12/2016   Procedure: RIGHT TOTAL HIP ARTHROPLASTY ANTERIOR APPROACH;  Surgeon: Paralee Cancel, MD;  Location: [facility];  Service: Orthopedics;  Laterality: Right;  requests 70 mins   TOTAL SHOULDER ARTHROPLASTY  03/01/2012   Procedure: TOTAL SHOULDER ARTHROPLASTY;  Surgeon: Marin Shutter, MD;  Location: [place];  Service: Orthopedics;  Laterality: Right;   UMBILICAL HERNIA REPAIR       Current Outpatient Medications  on File Prior to Visit  Medication Sig Dispense Refill   acetaminophen (TYLENOL) 325 MG tablet Take 325 mg by mouth as needed.     amLODipine (NORVASC) 5 MG tablet TAKE ONE TABLET BY MOUTH TWICE DAILY 180 tablet 3   aspirin EC 81 MG tablet Take 81 mg by mouth at bedtime.      atenolol (TENORMIN) 50 MG tablet TAKE ONE TABLET BY MOUTH ONE TIME DAILY at suppertime     atorvastatin (LIPITOR) 80 MG tablet Take 1 tablet by mouth daily.     calcium carbonate (TUMS - DOSED IN MG ELEMENTAL CALCIUM) 500 MG chewable tablet Chew 2 tablets by mouth as needed for indigestion or heartburn.      ciclopirox (LOPROX) 0.77 % cream Apply topically.     clopidogrel (PLAVIX) 75 MG tablet TAKE ONE TABLET BY MOUTH DAILY WITH BREAKFAST 90 tablet 2   docusate sodium (COLACE) 100 MG capsule Take 100 mg by mouth 2 (two) times daily.     empagliflozin (JARDIANCE) 25 MG TABS tablet Take by mouth daily.     famotidine (PEPCID) 40 MG tablet Take 1 tablet (40 mg total) by mouth 2 (two) times daily. 60 tablet 5   FREESTYLE TEST STRIPS test strip 1 each by Other route every morning.      gabapentin (NEURONTIN) 300 MG capsule Take 300 mg by mouth daily.     galantamine (RAZADYNE ER) 8 MG 24 hr capsule Take 8 mg by mouth every morning.     hydrochlorothiazide (HYDRODIURIL) 25 MG tablet Take 1 tablet by mouth every morning.     irbesartan (AVAPRO) 300 MG tablet Take 300 mg by mouth at bedtime.      isosorbide mononitrate (IMDUR) 30 MG 24 hr tablet Take 1 tablet by mouth once daily 90 tablet 3   metFORMIN (GLUCOPHAGE-XR) 500 MG 24 hr tablet Take 1 tablet (500 mg total) by mouth 2 (two) times daily.     mupirocin ointment (BACTROBAN) 2 % Apply to affected area three times a day as needed     Naftifine HCl 2 % CREA APPLY TO AFFECTED AREA(S) TWICE A DAY FOR YEAST     nitroGLYCERIN (NITROSTAT) 0.4 MG SL tablet DISSOLVE 1 TABLET UNDER TONGUE AS NEEDEDFOR CHEST PAIN. MAY REPEAT 5 MINUTES APART 3 TIMES IF NEEDED 25 tablet 11    pantoprazole (PROTONIX) 40 MG tablet Take 1 tablet by mouth 2 (two) times daily.     polyethylene glycol (MIRALAX / GLYCOLAX) 17 g packet Take 17 g by mouth daily.     promethazine (PHENERGAN) 12.5 MG tablet 1 tablet as needed     ranolazine (  RANEXA) 500 MG 12 hr tablet TAKE ONE TABLET BY MOUTH TWICE DAILY 180 tablet 2   TRESIBA FLEXTOUCH 100 UNIT/ML FlexTouch Pen Inject 18 Units into the skin daily.     triamcinolone cream (KENALOG) 0.1 % Apply 1 application topically as needed.     Vitamin D, Cholecalciferol, 25 MCG (1000 UT) CAPS Take 1 capsule by mouth daily.     No current facility-administered medications on file prior to visit.    Allergies  Allergen Reactions   Hydrocodone Nausea Only    Other reaction(s): nauseated Other reaction(s): nauseated Other reaction(s): nauseated Other reaction(s): nauseated   Methocarbamol Nausea Only    Other reaction(s): nauseated Other reaction(s): nauseated Other reaction(s): nauseated Other reaction(s): nauseated   Oxycodone Nausea Only  HBa1c; 6.8   Physical exam:  Today's Vitals   11/29/21 1018  BP: 126/76  Weight: 199 lb (90.3 kg)  Height: 5\' 9"  (1.753 m)   Body mass index is 29.39 kg/m.   Wt Readings from Last 3 Encounters:  11/29/21 199 lb (90.3 kg)  09/27/21 198 lb (89.8 kg)  08/02/21 197 lb 9.6 oz (89.6 kg)     Ht Readings from Last 3 Encounters:  11/29/21 5\' 9"  (1.753 m)  09/27/21 5\' 9"  (1.753 m)  08/02/21 5\' 9"  (1.753 m)      General: The patient is awake, alert and appears not in acute distress. The patient is well groomed. Head: Normocephalic, atraumatic.  Neck is supple. Mallampati 2,  neck circumference:17 inches . Nasal airflow  patent. Retrognathia is  seen.  Dental status: biological  Tremor in the tongue.  Cardiovascular:  Regular rate and cardiac rhythm by pulse,  without distended neck veins. Respiratory: Lungs are clear to auscultation.  Skin:  Without evidence of ankle edema, or rash. Trunk: The  patient's posture is erect.   Neurologic exam : The patient is awake and alert, oriented to place and time.   Memory subjective described as impaired .  Attention span & concentration ability appears reduced. He is friendly, interactive,  Speech is fluent,  without dysarthria, but with mild dysphonia. He is now looking for words and names- aphasia.   Mood and affect are appropriate. He is very outgoing.    Cranial nerves: no loss of smell or taste reported  Pupils are equal and briskly reactive to light. Funduscopic exam deferred. .  Extraocular movements in vertical and horizontal planes were intact and without nystagmus. No Diplopia. Visual fields by finger perimetry are intact. Hearing was intact to soft voice and finger rubbing.    Facial sensation intact to fine touch.  Facial motor strength is symmetric and tongue and uvula move midline.  Neck ROM : rotation, tilt and flexion extension were normal for age and shoulder shrug was symmetrical.    Motor exam:  Symmetric bulk, and ROM. Strength of hip flexion, adduction, abduction, knee extension is intact  pronator drift left, shoulder ROM restricted in both sids.  Increased tone on right biceps with rigor. Cogwheeling on both sides!  symmetric grip strength .   Sensory:  Fine touch and vibration were  normal.  Coordination: Rapid alternating movements in the fingers/hands were of normal speed.  The Finger-to-nose maneuver was intact with evidence of action tremor, not resting tremor .    Gait and station: Patient could rise unassisted from a seated position, walked without assistive device.  Stance is of normal width/ base and the patient turned with 3 steps.  Toe and heel walk were deferred.  Deep tendon reflexes: in the  upper and lower extremities are symmetric and intact. Brisk in upper extremities.  Babinski response was deferred.         After spending a total time of 38 minutes: including outside records review,  face  to face and additional time for physical and neurologic examination, review of laboratory studies, MEMORY TESTING, PET and MRI review.  personal review of imaging studies, reports and results of other testing and review of referral information / records as far as provided in visit, I have established the following assessments:  0) short term memory loss, loss of words.  MOCA 26/ 30. Alzheimer's dementia is most likely diagnosis, see PT scan results.   1) Dream enactment , clearly has improved initially on meds, but then will return.  2) tremor , tongue and hands, and legs. PLMs. But no weakness. Bilateral hand tremor, signature has changed. Has cogwheeling.  3) Neuropathy, loss of vibration in knees and below, DM. drop foot on the left, status post back surgery. He has bilateral biceps cogwheeling and hoarse voice but fluent EOM.  Secondary to medication?     I would like to thank Crist Infante, MD and Crist Infante, [place],  [place] [address] for allowing me to meet with and to take care of this pleasant patient.   In short, Hunter Mcbride. is presenting with REM BD, MCI  . My Plan is to proceed with:  He has not been as active in his sleep- XANAX  to 0. 25 mg and keep on  ZOLOFT 25 mg.  I will repeat MOCA in 6-7 months.  Phosphorylated TAU.      I plan to follow up either personally or through our NP within 4-6 months.   CC: I will share my notes with PCP.   Electronically signed by: Larey Seat, MD 11/29/2021 10:50 AM  [facility] Board certified by The [employer] of Sleep Medicine and Diplomate of the [employer] of Sleep Medicine. Board certified In Neurology through the [employer], Fellow of the [employer] of Neurology. Medical Director of [facility].

## 2021-11-29 NOTE — Patient Instructions (Signed)
There are well-accepted and sensible ways to reduce risk for Alzheimers disease and other degenerative brain disorders .  Exercise Daily Walk A daily 20 minute walk should be part of your routine. Disease related apathy can be a significant roadblock to exercise and the only way to overcome this is to make it a daily routine and perhaps have a reward at the end (something your loved one loves to eat or drink perhaps) or a personal trainer coming to the home can also be very useful. Most importantly, the patient is much more likely to exercise if the caregiver / spouse does it with him/her. In general a structured, repetitive schedule is best.  General Health: Any diseases which effect your body will effect your brain such as a pneumonia, urinary infection, blood clot, heart attack or stroke. Keep contact with your primary care doctor for regular follow ups.  Sleep. A good nights sleep is healthy for the brain. Seven hours is recommended. If you have insomnia or poor sleep habits we can give you some instructions. If you have sleep apnea wear your mask.  Diet: Eating a heart healthy diet is also a good idea; fish and poultry instead of red meat, nuts (mostly non-peanuts), vegetables, fruits, olive oil or canola oil (instead of butter), minimal salt (use other spices to flavor foods), whole grain rice, bread, cereal and pasta and wine in moderation.Research is now showing that the MIND diet, which is a combination of The Mediterranean diet and the DASH diet, is beneficial for cognitive processing and longevity. Information about this diet can be found in The MIND Diet, a book by Maggie Moon, MS, RDN, and online at https://www.healthline.com/nutrition/mind-diet  Finances, Power of Attorney and Advance Directives: You should consider putting legal safeguards in place with regard to financial and medical decision making. While the spouse always has power of attorney for medical and financial issues in the  absence of any form, you should consider what you want in case the spouse / caregiver is no longer around or capable of making decisions.   The Alzheimers Association Position on Disease Prevention  Can Alzheimer's be prevented? It's a question that continues to intrigue researchers and fuel new investigations. There are no clear-cut answers yet -- partially due to the need for more large-scale studies in diverse populations -- but promising research is under way. The Alzheimer's Association is leading the worldwide effort to find a treatment for Alzheimer's, delay its onset and prevent it from developing.   What causes Alzheimer's? Experts agree that in the vast majority of cases, Alzheimer's, like other common chronic conditions, probably develops as a result of complex interactions among multiple factors, including age, genetics, environment, lifestyle and coexisting medical conditions. Although some risk factors -- such as age or genes -- cannot be changed, other risk factors -- such as high blood pressure and lack of exercise -- usually can be changed to help reduce risk. Research in these areas may lead to new ways to detect those at highest risk.  Prevention studies A small percentage of people with Alzheimer's disease (less than 1 percent) have an early-onset type associated with genetic mutations. Individuals who have these genetic mutations are guaranteed to develop the disease. An ongoing clinical trial conducted by the [facility] ([facility]), is testing whether antibodies to beta-amyloid can reduce the accumulation of beta-amyloid plaque in the brains of people with such genetic mutations and thereby reduce, delay or prevent symptoms. Participants in the trial are receiving antibodies (  or placebo) before they develop symptoms, and the development of beta-amyloid plaques is being monitored by brain scans and other tests.  Another clinical trial, known as the A4 trial  (Anti-Amyloid Treatment in Asymptomatic Alzheimer's), is testing whether antibodies to beta-amyloid can reduce the risk of Alzheimer's disease in older people (ages 65 to 85) at high risk for the disease. The A4 trial is being conducted by the Alzheimer's Disease Cooperative Study.  Though research is still evolving, evidence is strong that people can reduce their risk by making key lifestyle changes, including participating in regular activity and maintaining good heart health. Based on this research, the Alzheimer's Association offers 10 Ways to Love Your Brain -- a collection of tips that can reduce the risk of cognitive decline.  Heart-head connection  New research shows there are things we can do to reduce the risk of mild cognitive impairment and dementia.  Several conditions known to increase the risk of cardiovascular disease -- such as high blood pressure, diabetes and high cholesterol -- also increase the risk of developing Alzheimer's. Some autopsy studies show that as many as 80 percent of individuals with Alzheimer's disease also have cardiovascular disease.  A longstanding question is why some people develop hallmark Alzheimer's plaques and tangles but do not develop the symptoms of Alzheimer's. Vascular disease may help researchers eventually find an answer. Some autopsy studies suggest that plaques and tangles may be present in the brain without causing symptoms of cognitive decline unless the brain also shows evidence of vascular disease. More research is needed to better understand the link between vascular health and Alzheimer's.  Physical exercise and diet Regular physical exercise may be a beneficial strategy to lower the risk of Alzheimer's and vascular dementia. Exercise may directly benefit brain cells by increasing blood and oxygen flow in the brain. Because of its known cardiovascular benefits, a medically approved exercise program is a valuable part of any overall wellness  plan.  Current evidence suggests that heart-healthy eating may also help protect the brain. Heart-healthy eating includes limiting the intake of sugar and saturated fats and making sure to eat plenty of fruits, vegetables, and whole grains. No one diet is best. Two diets that have been studied and may be beneficial are the DASH (Dietary Approaches to Stop Hypertension) diet and the Mediterranean diet. The DASH diet emphasizes vegetables, fruits and fat-free or low-fat dairy products; includes whole grains, fish, poultry, beans, seeds, nuts and vegetable oils; and limits sodium, sweets, sugary beverages and red meats. A Mediterranean diet includes relatively little red meat and emphasizes whole grains, fruits and vegetables, fish and shellfish, and nuts, olive oil and other healthy fats.  Social connections and intellectual activity A number of studies indicate that maintaining strong social connections and keeping mentally active as we age might lower the risk of cognitive decline and Alzheimer's. Experts are not certain about the reason for this association. It may be due to direct mechanisms through which social and mental stimulation strengthen connections between nerve cells in the brain.  Head trauma There appears to be a strong link between future risk of Alzheimer's and serious head trauma, especially when injury involves loss of consciousness. You can help reduce your risk of Alzheimer's by protecting your head.  Wear a seat belt  Use a helmet when participating in sports  "Fall-proof" your home   What you can do now While research is not yet conclusive, certain lifestyle choices, such as physical activity and diet, may help support brain   health and prevent Alzheimer's. Many of these lifestyle changes have been shown to lower the risk of other diseases, like heart disease and diabetes, which have been linked to Alzheimer's. With few drawbacks and plenty of known benefits, healthy lifestyle  choices can improve your health and possibly protect your brain.  Learn more about brain health. You can help increase our knowledge by considering participation in a clinical study. Our free clinical trial matching services, TrialMatch, can help you find clinical trials in your area that are seeking volunteers.  Understanding prevention research Here are some things to keep in mind about the research underlying much of our current knowledge about possible prevention:  Insights about potentially modifiable risk factors apply to large population groups, not to individuals. Studies can show that factor X is associated with outcome Y, but cannot guarantee that any specific person will have that outcome. As a result, you can "do everything right" and still have a serious health problem or "do everything wrong" and live to be 100.  Much of our current evidence comes from large epidemiological studies such as the [place] Aging Study, the Nurses' Health Study, the Adult Changes in Thought Study and the [place] Project. These studies explore pre-existing behaviors and use statistical methods to relate those behaviors to health outcomes. This type of study can show an "association" between a factor and an outcome but cannot "prove" cause and effect. This is why we describe evidence based on these studies with such language as "suggests," "may show," "might protect," and "is associated with."  The gold standard for showing cause and effect is a clinical trial in which participants are randomly assigned to a prevention or risk management strategy or a control group. Researchers follow the two groups over time to see if their outcomes differ significantly.  It is unlikely that some prevention or risk management strategies will ever be tested in randomized trials for ethical or practical reasons. One example is exercise. Definitively testing the impact of exercise on Alzheimer's risk would require a huge  trial enrolling thousands of people and following them for many years. The expense and logistics of such a trial would be prohibitive, and it would require some people to go without exercise, a known health benefit.   

## 2021-11-30 DIAGNOSIS — E785 Hyperlipidemia, unspecified: Secondary | ICD-10-CM | POA: Diagnosis not present

## 2021-11-30 DIAGNOSIS — I10 Essential (primary) hypertension: Secondary | ICD-10-CM | POA: Diagnosis not present

## 2021-11-30 DIAGNOSIS — R7989 Other specified abnormal findings of blood chemistry: Secondary | ICD-10-CM | POA: Diagnosis not present

## 2021-11-30 LAB — P-TAU181: p-tau181: 1.16 pg/mL — ABNORMAL HIGH (ref 0.00–0.97)

## 2021-12-02 NOTE — Progress Notes (Signed)
P Tau confirms elevated A -Beta amyloid pathology- and this protein is typically associated  with Alzheimer's disease. It confirms a pathology that helps to differentiate dementias at an early stage of memory loss.

## 2021-12-07 ENCOUNTER — Encounter: Payer: Self-pay | Admitting: Neurology

## 2022-01-17 DIAGNOSIS — I10 Essential (primary) hypertension: Secondary | ICD-10-CM | POA: Diagnosis not present

## 2022-01-17 DIAGNOSIS — D7589 Other specified diseases of blood and blood-forming organs: Secondary | ICD-10-CM | POA: Diagnosis not present

## 2022-01-17 DIAGNOSIS — G309 Alzheimer's disease, unspecified: Secondary | ICD-10-CM | POA: Diagnosis not present

## 2022-01-17 DIAGNOSIS — M545 Low back pain, unspecified: Secondary | ICD-10-CM | POA: Diagnosis not present

## 2022-01-17 DIAGNOSIS — E1169 Type 2 diabetes mellitus with other specified complication: Secondary | ICD-10-CM | POA: Diagnosis not present

## 2022-01-17 DIAGNOSIS — M25552 Pain in left hip: Secondary | ICD-10-CM | POA: Diagnosis not present

## 2022-01-17 DIAGNOSIS — R413 Other amnesia: Secondary | ICD-10-CM | POA: Diagnosis not present

## 2022-01-17 DIAGNOSIS — G4752 REM sleep behavior disorder: Secondary | ICD-10-CM | POA: Diagnosis not present

## 2022-01-17 DIAGNOSIS — F0280 Dementia in other diseases classified elsewhere without behavioral disturbance: Secondary | ICD-10-CM | POA: Diagnosis not present

## 2022-02-04 DIAGNOSIS — M5459 Other low back pain: Secondary | ICD-10-CM | POA: Diagnosis not present

## 2022-02-04 DIAGNOSIS — M5416 Radiculopathy, lumbar region: Secondary | ICD-10-CM | POA: Diagnosis not present

## 2022-02-04 DIAGNOSIS — M47896 Other spondylosis, lumbar region: Secondary | ICD-10-CM | POA: Diagnosis not present

## 2022-02-15 ENCOUNTER — Ambulatory Visit: Payer: PPO | Admitting: Podiatry

## 2022-02-15 ENCOUNTER — Encounter: Payer: Self-pay | Admitting: Podiatry

## 2022-02-15 DIAGNOSIS — E1142 Type 2 diabetes mellitus with diabetic polyneuropathy: Secondary | ICD-10-CM | POA: Diagnosis not present

## 2022-02-15 DIAGNOSIS — M79676 Pain in unspecified toe(s): Secondary | ICD-10-CM

## 2022-02-15 DIAGNOSIS — B351 Tinea unguium: Secondary | ICD-10-CM | POA: Diagnosis not present

## 2022-02-15 NOTE — Progress Notes (Signed)
  Subjective:  Patient ID: Hunter Mcbride., male    DOB: [birth year],   MRN: [id number]  Chief Complaint  Patient presents with   Nail Problem    Patient states toenails are [place], thick, discolored and painful.     77 y.o. male presents for concern of thickened elongated and painful nails that are difficult to trim. Requesting to have them trimmed today. Relates burning and tingling in their feet. Patient is diabetic and last A1c was  Lab Results  Component Value Date   HGBA1C 7.3 (H) 07/26/2019   .   PCP:  Crist Infante, MD    . Denies any other pedal complaints. Denies n/v/f/c.   Past Medical History:  Diagnosis Date   Arthritis    "lower back; right shoulder; left thumb; joints" (06/21/2013)   Asthma    "not sure if this is true or not" (06/21/2013)   CAD (coronary artery disease) 06/2013   s/p PTCA/DES x 3 to mid LAD 2015. b. s/p CABG in 2018. c. cath 12/2017 -> med rx.   Cancer ([place])    Clotting disorder ([place])    GERD (gastroesophageal reflux disease)    Gout    "maybe twice in my life"   Headache(784.0)    "weekly for the last 3-4 months" (06/21/2013)   Hyperlipidemia    Hypertension    Myocardial infarction ([place])    "/Dr. Carlton Adam had one between 2014-2015) (06/21/2013)   Neuromuscular disorder ([place])    Osteoarthritis    Sleep apnea    WEIGHT LOSS, NO LONGER NEEDS PER PATIENT   Type II diabetes mellitus ([facility])    TYPE 2    Objective:  Physical Exam: Vascular: DP/PT pulses 2/4 bilateral. CFT <3 seconds. Normal hair growth on digits. No edema.  Skin. No lacerations or abrasions bilateral feet. Left hallux nail bed healing well. Nails 1-5 bilateral are thickened elongated and with subungual debris.  Musculoskeletal: MMT 5/5 bilateral lower extremities in DF, PF, Inversion and Eversion. Deceased ROM in DF of ankle joint. No pain to palpation of the foot.  Neurological: Sensation intact to light touch. Protective sensation intact b/l.   Assessment:   No diagnosis  found.    Plan:  Patient was evaluated and treated and all questions answered. -Discussed and educated patient on diabetic foot care, especially with  regards to the vascular, neurological and musculoskeletal systems.  -Stressed the importance of good glycemic control and the detriment of not  controlling glucose levels in relation to the foot. -Discussed supportive shoes at all times and checking feet regularly.  -Mechanically debrided all nails 1-5 bilateral using sterile nail nipper and filed with dremel without incident  -Answered all patient questions -Patient to return  in 3 months for at risk foot care -Patient advised to call the office if any problems or questions arise in the meantime.    Lorenda Peck, DPM

## 2022-02-24 DIAGNOSIS — L720 Epidermal cyst: Secondary | ICD-10-CM | POA: Diagnosis not present

## 2022-02-24 DIAGNOSIS — D692 Other nonthrombocytopenic purpura: Secondary | ICD-10-CM | POA: Diagnosis not present

## 2022-02-24 DIAGNOSIS — D2239 Melanocytic nevi of other parts of face: Secondary | ICD-10-CM | POA: Diagnosis not present

## 2022-02-24 DIAGNOSIS — L309 Dermatitis, unspecified: Secondary | ICD-10-CM | POA: Diagnosis not present

## 2022-02-24 DIAGNOSIS — L821 Other seborrheic keratosis: Secondary | ICD-10-CM | POA: Diagnosis not present

## 2022-02-24 DIAGNOSIS — L570 Actinic keratosis: Secondary | ICD-10-CM | POA: Diagnosis not present

## 2022-02-24 DIAGNOSIS — D1801 Hemangioma of skin and subcutaneous tissue: Secondary | ICD-10-CM | POA: Diagnosis not present

## 2022-02-24 DIAGNOSIS — Z85828 Personal history of other malignant neoplasm of skin: Secondary | ICD-10-CM | POA: Diagnosis not present

## 2022-02-24 DIAGNOSIS — L814 Other melanin hyperpigmentation: Secondary | ICD-10-CM | POA: Diagnosis not present

## 2022-02-25 DIAGNOSIS — M5451 Vertebrogenic low back pain: Secondary | ICD-10-CM | POA: Diagnosis not present

## 2022-02-28 ENCOUNTER — Ambulatory Visit: Payer: PPO | Admitting: Neurology

## 2022-03-04 DIAGNOSIS — M48061 Spinal stenosis, lumbar region without neurogenic claudication: Secondary | ICD-10-CM | POA: Diagnosis not present

## 2022-03-04 DIAGNOSIS — M5416 Radiculopathy, lumbar region: Secondary | ICD-10-CM | POA: Diagnosis not present

## 2022-03-07 ENCOUNTER — Telehealth: Payer: Self-pay | Admitting: *Deleted

## 2022-03-07 NOTE — Telephone Encounter (Signed)
Pt has been scheduled for a tele visit, 03/09/22 9:40.  Consent on file / medications reconciled.

## 2022-03-07 NOTE — Telephone Encounter (Signed)
Pt has been scheduled for a tele visit, 03/09/22 9:40.  Consent on file / medications reconciled.    Patient Consent for Virtual Visit        Hunter Mcbride. has provided verbal consent on 03/07/2022 for a virtual visit (video or telephone).   CONSENT FOR VIRTUAL VISIT FOR:  Hunter Mcbride.  By participating in this virtual visit I agree to the following:  I hereby voluntarily request, consent and authorize [facility] and its employed or contracted physicians, physician assistants, nurse practitioners or other licensed health care professionals (the Practitioner), to provide me with telemedicine health care services (the "Services") as deemed necessary by the treating Practitioner. I acknowledge and consent to receive the Services by the Practitioner via telemedicine. I understand that the telemedicine visit will involve communicating with the Practitioner through live audiovisual communication technology and the disclosure of certain medical information by electronic transmission. I acknowledge that I have been given the opportunity to request an in-person assessment or other available alternative prior to the telemedicine visit and am voluntarily participating in the telemedicine visit.  I understand that I have the right to withhold or withdraw my consent to the use of telemedicine in the course of my care at any time, without affecting my right to future care or treatment, and that the Practitioner or I may terminate the telemedicine visit at any time. I understand that I have the right to inspect all information obtained and/or recorded in the course of the telemedicine visit and may receive copies of available information for a reasonable fee.  I understand that some of the potential risks of receiving the Services via telemedicine include:  Delay or interruption in medical evaluation due to technological equipment failure or disruption; Information transmitted may not be  sufficient (e.g. poor resolution of images) to allow for appropriate medical decision making by the Practitioner; and/or  In rare instances, security protocols could fail, causing a breach of personal health information.  Furthermore, I acknowledge that it is my responsibility to provide information about my medical history, conditions and care that is complete and accurate to the best of my ability. I acknowledge that Practitioner's advice, recommendations, and/or decision may be based on factors not within their control, such as incomplete or inaccurate data provided by me or distortions of diagnostic images or specimens that may result from electronic transmissions. I understand that the practice of medicine is not an exact science and that Practitioner makes no warranties or guarantees regarding treatment outcomes. I acknowledge that a copy of this consent can be made available to me via my patient portal [facility] MyChart), or I can request a printed copy by calling the office of [facility].    I understand that my insurance will be billed for this visit.   I have read or had this consent read to me. I understand the contents of this consent, which adequately explains the benefits and risks of the Services being provided via telemedicine.  I have been provided ample opportunity to ask questions regarding this consent and the Services and have had my questions answered to my satisfaction. I give my informed consent for the services to be provided through the use of telemedicine in my medical care

## 2022-03-07 NOTE — Telephone Encounter (Signed)
   Pre-operative Risk Assessment    Patient Name: Hunter Mcbride.  DOB: November 18, 1945 MRN: [id number]      Request for Surgical Clearance    Procedure:   LUMBAR SNRB  Date of Surgery:  Clearance 03/10/22                                 Surgeon:   Surgeon's Group or Practice Name:  [facility] Phone number:  [phone] Fax number:  [phone]   Type of Clearance Requested:   - Medical  - Pharmacy:  Hold Clopidogrel (Plavix) X'S 7 DAYS, PT ALSO ON ASPIRIN BUT DIDN'T ASK TO HOLD ON CLEARANCE    Type of Anesthesia:  Not Indicated   Additional requests/questions:    Wilhemina Cash   03/07/2022, 12:24 PM

## 2022-03-07 NOTE — Telephone Encounter (Signed)
   Name: Hunter Mcbride.  DOB: [birth year]  MRN: [id number]  Primary Cardiologist: Kristeen Miss, MD   Preoperative team, please contact this patient and set up a phone call appointment for further preoperative risk assessment. Please obtain consent and complete medication review. Thank you for your help.  I confirm that guidance regarding antiplatelet and oral anticoagulation therapy has been completed and, if necessary, noted below.  Per office protocol, if patient is without any new symptoms or concerns at the time of their virtual visit, he may hold Plavix for 5 days prior to procedure. Please resume Plavix as soon as possible postprocedure, at the discretion of the surgeon. Regarding ASA therapy, we recommend continuation of ASA throughout the perioperative period.  However, if the surgeon feels that cessation of ASA is required in the perioperative period, it may be stopped 5-7 days prior to surgery with a plan to resume it as soon as felt to be feasible from a surgical standpoint in the post-operative period.    Joylene Grapes, NP 03/07/2022, 12:34 PM [facility]

## 2022-03-09 ENCOUNTER — Encounter: Payer: Self-pay | Admitting: Nurse Practitioner

## 2022-03-09 ENCOUNTER — Ambulatory Visit: Payer: PPO | Attending: Cardiovascular Disease | Admitting: Nurse Practitioner

## 2022-03-09 DIAGNOSIS — Z01810 Encounter for preprocedural cardiovascular examination: Secondary | ICD-10-CM | POA: Diagnosis not present

## 2022-03-09 NOTE — Progress Notes (Signed)
Virtual Visit via Telephone Note   Because of Hunter Gallardo Pavelko Jr.'s co-morbid illnesses, he is at least at moderate risk for complications without adequate follow up.  This format is felt to be most appropriate for this patient at this time.  The patient did not have access to video technology/had technical difficulties with video requiring transitioning to audio format only (telephone).  All issues noted in this document were discussed and addressed.  No physical exam could be performed with this format.  Please refer to the patient's chart for his consent to telehealth for [place].  Evaluation Performed:  Preoperative cardiovascular risk assessment _____________   Date:  03/09/2022   Patient ID:  Hunter Stakes., DOB 06-07-1945, MRN [id number] Patient Location:  Home Provider location:   Office  Primary Care Provider:  Crist Infante, MD Primary Cardiologist:  Mertie Moores, MD  Chief Complaint / Patient Profile   77 y.o. y/o male with a h/o CAD s/p DES x 3-M LAD in 2015, s/p CABG x5 in 2018, hypertension, hyperlipidemia, type 2 diabetes, and OSA who is pending Lumbar SNRB on 03/17/2022 with [facility] and presents today for telephonic preoperative cardiovascular risk assessment.  History of Present Illness    Hunter Millon. is a 77 y.o. male who presents via audio/video conferencing for a telehealth visit today.  Pt was last seen in cardiology clinic on 08/02/2021 by Dr. Acie Fredrickson.  At that time Hunter Stakes. was doing well. The patient is now pending procedure as outlined above. Since his last visit, he has done well from a cardiac standpoint.   He denies chest pain, palpitations, dyspnea, pnd, orthopnea, n, v, dizziness, syncope, edema, weight gain, or early satiety. All other systems reviewed and are otherwise negative except as noted above.   Past Medical History    Past Medical History:  Diagnosis Date   Arthritis    "lower back; right shoulder; left thumb;  joints" (06/21/2013)   Asthma    "not sure if this is true or not" (06/21/2013)   CAD (coronary artery disease) 06/2013   s/p PTCA/DES x 3 to mid LAD 2015. b. s/p CABG in 2018. c. cath 12/2017 -> med rx.   Cancer ([place])    Clotting disorder ([place])    GERD (gastroesophageal reflux disease)    Gout    "maybe twice in my life"   Headache(784.0)    "weekly for the last 3-4 months" (06/21/2013)   Hyperlipidemia    Hypertension    Myocardial infarction ([place])    "/Dr. Katharina Caper I've had one between 2014-2015) (06/21/2013)   Neuromuscular disorder ([place])    Osteoarthritis    Sleep apnea    WEIGHT LOSS, NO LONGER NEEDS PER PATIENT   Type II diabetes mellitus ([place])    TYPE 2   Past Surgical History:  Procedure Laterality Date   CARDIAC CATHETERIZATION  1980's   "once"   CARDIAC CATHETERIZATION N/A 07/23/2015   Procedure: Left Heart Cath and Coronary Angiography;  Surgeon: Sherren Mocha, MD;  Location: [place] [facility];  Service: Cardiovascular;  Laterality: N/A;   CARDIOVASCULAR STRESS TEST  06/10/2008   EF 68%   CARPAL TUNNEL RELEASE Bilateral    COLONOSCOPY     CORONARY ANGIOPLASTY WITH STENT PLACEMENT  06/21/2013   "3"   CORONARY ARTERY BYPASS GRAFT N/A 11/07/2016   Procedure: CORONARY ARTERY BYPASS GRAFTING (CABG) x 5 (LIMA to DISTAL LAD, SVG to DIAGONAL, SVG to CIRCUMFLEX, and SVG SEQUENTIALLY to  PLB and DISTAL PDA) with EVH of the RIGHT GREATER SAPHENOUS VEIN and LEFT INTERNAL MAMMARY ARTERY HARVEST;  Surgeon: Grace Isaac, MD;  Location: [place];  Service: Open Heart Surgery;  Laterality: N/A;   CORONARY STENT INTERVENTION N/A 06/07/2016   Procedure: Coronary Stent Intervention;  Surgeon: Burnell Blanks, MD;  Location: [place] [facility];  Service: Cardiovascular;  Laterality: N/A;   EXCISION MORTON'S NEUROMA Left    EYE SURGERY Left    "removed film over my eye"   FRACTIONAL FLOW RESERVE WIRE N/A 06/26/2013   Procedure: [place];  Surgeon: [name], MD;  Location: [place] [facility];  Service: Cardiovascular;  Laterality: N/A;   HERNIA REPAIR Left    INTRAVASCULAR PRESSURE WIRE/FFR STUDY N/A 10/28/2016   Procedure: INTRAVASCULAR PRESSURE WIRE/FFR STUDY;  Surgeon: Leonie Man, MD;  Location: [place] [facility];  Service: Cardiovascular;  Laterality: N/A;   JOINT REPLACEMENT Left 03/2013   "thumb"   LEFT HEART CATH AND CORONARY ANGIOGRAPHY N/A 06/07/2016   Procedure: Left Heart Cath and Coronary Angiography;  Surgeon: Burnell Blanks, MD;  Location: [place] [facility];  Service: Cardiovascular;  Laterality: N/A;   LEFT HEART CATH AND CORONARY ANGIOGRAPHY N/A 10/28/2016   Procedure: LEFT HEART CATH AND CORONARY ANGIOGRAPHY;  Surgeon: Leonie Man, MD;  Location: [place] [facility];  Service: Cardiovascular;  Laterality: N/A;   LEFT HEART CATH AND CORS/GRAFTS ANGIOGRAPHY N/A 12/25/2017   Procedure: LEFT HEART CATH AND CORS/GRAFTS ANGIOGRAPHY;  Surgeon: Sherren Mocha, MD;  Location: [place] [facility];  Service: Cardiovascular;  Laterality: N/A;   LEFT HEART CATH AND CORS/GRAFTS ANGIOGRAPHY N/A 07/26/2019   Procedure: LEFT HEART CATH AND CORS/GRAFTS ANGIOGRAPHY;  Surgeon: [name], MD;  Location: [place] [facility];  Service: Cardiovascular;  Laterality: N/A;   LEFT HEART CATHETERIZATION WITH CORONARY ANGIOGRAM N/A 06/21/2013   Procedure: LEFT HEART CATHETERIZATION WITH CORONARY ANGIOGRAM;  Surgeon: Burnell Blanks, MD;  Location[facility];  Service: Cardiovascular;  Laterality: N/A;   LEFT HEART CATHETERIZATION WITH CORONARY ANGIOGRAM N/A 06/26/2013   Procedure: LEFT HEART CATHETERIZATION WITH CORONARY ANGIOGRAM;  Surgeon: [name], MD;  Location: [place] [facility];  Service: Cardiovascular;  Laterality: N/A;   LUMBAR LAMINECTOMY/DECOMPRESSION MICRODISCECTOMY Left 05/30/2018   Procedure: Left  [place] L5 decompression;  Surgeon: Melina Schools, MD;  Location: [place];  Service: Orthopedics;  Laterality: Left;   184min   POLYPECTOMY     TEE WITHOUT CARDIOVERSION N/A 11/07/2016   Procedure: TRANSESOPHAGEAL ECHOCARDIOGRAM (TEE);  Surgeon: Grace Isaac, MD;  Location: [place];  Service: Open Heart Surgery;  Laterality: N/A;   TONSILLECTOMY     TOTAL HIP ARTHROPLASTY Right 04/12/2016   Procedure: RIGHT TOTAL HIP ARTHROPLASTY ANTERIOR APPROACH;  Surgeon: Paralee Cancel, MD;  Location: [facility];  Service: Orthopedics;  Laterality: Right;  requests 70 mins   TOTAL SHOULDER ARTHROPLASTY  03/01/2012   Procedure: TOTAL SHOULDER ARTHROPLASTY;  Surgeon: Marin Shutter, MD;  Location: [place];  Service: Orthopedics;  Laterality: Right;   UMBILICAL HERNIA REPAIR      Allergies  Allergies  Allergen Reactions   Hydrocodone Nausea Only    Other reaction(s): nauseated Other reaction(s): nauseated Other reaction(s): nauseated Other reaction(s): nauseated   Methocarbamol Nausea Only    Other reaction(s): nauseated Other reaction(s): nauseated Other reaction(s): nauseated Other reaction(s): nauseated   Oxycodone Nausea Only    Home Medications    Prior to Admission medications  Medication Sig Start Date End Date Taking? Authorizing Provider  acetaminophen (TYLENOL) 325 MG tablet Take 325 mg by mouth as needed. 12/14/10   [provider]  amLODipine (NORVASC) 5 MG tablet TAKE ONE TABLET BY MOUTH TWICE DAILY 09/14/21   Nahser, Wonda Cheng, MD  aspirin EC 81 MG tablet Take 81 mg by mouth at bedtime.     [provider]  atenolol (TENORMIN) 50 MG tablet TAKE ONE TABLET BY MOUTH ONE TIME DAILY at suppertime    [provider]  atorvastatin (LIPITOR) 80 MG tablet Take 1 tablet by mouth daily.    [provider]  calcium carbonate (TUMS - DOSED IN MG ELEMENTAL CALCIUM) 500 MG chewable tablet Chew 2 tablets by mouth as needed for indigestion or heartburn.     [provider]  ciclopirox (LOPROX) 0.77 % cream Apply topically. 06/18/20   [provider]  clopidogrel  (PLAVIX) 75 MG tablet TAKE ONE TABLET BY MOUTH DAILY WITH BREAKFAST 10/20/21   Nahser, Wonda Cheng, MD  docusate sodium (COLACE) 100 MG capsule Take 100 mg by mouth 2 (two) times daily.    [provider]  donepezil (ARICEPT) 5 MG tablet Take 5 mg by mouth at bedtime.    [provider]  empagliflozin (JARDIANCE) 25 MG TABS tablet Take by mouth daily.    [provider]  famotidine (PEPCID) 40 MG tablet Take 1 tablet (40 mg total) by mouth 2 (two) times daily. 12/01/20   Levin Erp, PA  FREESTYLE TEST STRIPS test strip 1 each by Other route every morning.  01/23/18   [provider]  gabapentin (NEURONTIN) 300 MG capsule Take 300 mg by mouth daily.    [provider]  hydrochlorothiazide (HYDRODIURIL) 25 MG tablet Take 1 tablet by mouth every morning.    [provider]  irbesartan (AVAPRO) 300 MG tablet Take 300 mg by mouth at bedtime.     [provider]  isosorbide mononitrate (IMDUR) 30 MG 24 hr tablet Take 1 tablet by mouth once daily 09/09/21   Nahser, Wonda Cheng, MD  metFORMIN (GLUCOPHAGE-XR) 500 MG 24 hr tablet Take 1 tablet (500 mg total) by mouth 2 (two) times daily. 07/29/19   Isaiah Serge, NP  mupirocin ointment (BACTROBAN) 2 % Apply to affected area three times a day as needed    [provider]  Naftifine HCl 2 % CREA APPLY TO AFFECTED AREA(S) TWICE A DAY FOR YEAST    [provider]  nitroGLYCERIN (NITROSTAT) 0.4 MG SL tablet DISSOLVE 1 TABLET UNDER TONGUE AS NEEDEDFOR CHEST PAIN. MAY REPEAT 5 MINUTES APART 3 TIMES IF NEEDED 10/04/21   Nahser, Wonda Cheng, MD  pantoprazole (PROTONIX) 40 MG tablet Take 1 tablet by mouth 2 (two) times daily.    [provider]  polyethylene glycol (MIRALAX / GLYCOLAX) 17 g packet Take 17 g by mouth daily.    [provider]  promethazine (PHENERGAN) 12.5 MG tablet 1 tablet as needed 12/10/19   [provider]  ranolazine (RANEXA) 500 MG 12 hr  tablet TAKE ONE TABLET BY MOUTH TWICE DAILY 08/05/21   Nahser, Wonda Cheng, MD  TRESIBA FLEXTOUCH 100 UNIT/ML FlexTouch Pen Inject 18 Units into the skin daily. 02/11/20   [provider]  triamcinolone cream (KENALOG) 0.1 % Apply 1 application topically as needed. 10/29/20   [provider]  Vitamin D, Cholecalciferol, 25 MCG (1000 UT) CAPS Take 1 capsule by mouth daily. 01/12/09   [provider]    Physical Exam    Vital Signs:  Hunter Mcbride. does not have vital signs available for review today.  Given telephonic nature of communication, physical exam is limited. AAOx3. NAD. Normal affect.  Speech and respirations are unlabored.  Accessory Clinical Findings    None  Assessment & Plan    1.  Preoperative Cardiovascular Risk Assessment:  According to the Revised Cardiac Risk Index (RCRI), his Perioperative Risk of Major Cardiac Event is (%): 6.6. His Functional Capacity in METs is: 8.97 according to the [place] Activity Status Index (DASI).Therefore, based on ACC/AHA guidelines, patient would be at acceptable risk for the planned procedure without further cardiovascular testing.  The patient was advised that if he develops new symptoms prior to surgery to contact our office to arrange for a follow-up visit, and he verbalized understanding.  Per office protocol, he may hold Plavix for 5 days prior to procedure. Please resume Plavix as soon as possible postprocedure, at the discretion of the surgeon. Regarding ASA therapy, we recommend continuation of ASA throughout the perioperative period. However, if the surgeon feels that cessation of ASA is required in the perioperative period, it may be stopped 5-7 days prior to surgery with a plan to resume it as soon as felt to be feasible from a surgical standpoint in the post-operative period.   A copy of this note will be routed to requesting surgeon.  Time:   Today, I have spent 6 minutes with the patient with  telehealth technology discussing medical history, symptoms, and management plan.     Lenna Sciara, NP  03/09/2022, 9:48 AM

## 2022-03-10 ENCOUNTER — Ambulatory Visit: Payer: PPO | Admitting: Neurology

## 2022-03-17 DIAGNOSIS — M5416 Radiculopathy, lumbar region: Secondary | ICD-10-CM | POA: Diagnosis not present

## 2022-03-31 DIAGNOSIS — M48061 Spinal stenosis, lumbar region without neurogenic claudication: Secondary | ICD-10-CM | POA: Diagnosis not present

## 2022-03-31 DIAGNOSIS — M792 Neuralgia and neuritis, unspecified: Secondary | ICD-10-CM | POA: Diagnosis not present

## 2022-03-31 DIAGNOSIS — M5459 Other low back pain: Secondary | ICD-10-CM | POA: Diagnosis not present

## 2022-04-04 ENCOUNTER — Other Ambulatory Visit: Payer: Self-pay | Admitting: Cardiovascular Disease

## 2022-04-04 ENCOUNTER — Other Ambulatory Visit: Payer: Self-pay | Admitting: Neurosurgery

## 2022-04-04 DIAGNOSIS — M48062 Spinal stenosis, lumbar region with neurogenic claudication: Secondary | ICD-10-CM | POA: Diagnosis not present

## 2022-04-04 DIAGNOSIS — Z6829 Body mass index (BMI) 29.0-29.9, adult: Secondary | ICD-10-CM | POA: Diagnosis not present

## 2022-04-06 DIAGNOSIS — E119 Type 2 diabetes mellitus without complications: Secondary | ICD-10-CM | POA: Diagnosis not present

## 2022-04-06 DIAGNOSIS — H40013 Open angle with borderline findings, low risk, bilateral: Secondary | ICD-10-CM | POA: Diagnosis not present

## 2022-04-08 ENCOUNTER — Ambulatory Visit
Admission: RE | Admit: 2022-04-08 | Discharge: 2022-04-08 | Disposition: A | Discharge status: Home / Self Care | Payer: PPO | Source: Ambulatory Visit | Attending: Neurosurgery | Admitting: Neurosurgery

## 2022-04-08 DIAGNOSIS — M48062 Spinal stenosis, lumbar region with neurogenic claudication: Secondary | ICD-10-CM

## 2022-04-08 DIAGNOSIS — M545 Low back pain, unspecified: Secondary | ICD-10-CM | POA: Diagnosis not present

## 2022-04-11 DIAGNOSIS — M48062 Spinal stenosis, lumbar region with neurogenic claudication: Secondary | ICD-10-CM | POA: Diagnosis not present

## 2022-04-11 DIAGNOSIS — M899 Disorder of bone, unspecified: Secondary | ICD-10-CM | POA: Diagnosis not present

## 2022-04-11 DIAGNOSIS — Z6829 Body mass index (BMI) 29.0-29.9, adult: Secondary | ICD-10-CM | POA: Diagnosis not present

## 2022-04-13 ENCOUNTER — Telehealth: Payer: Self-pay | Admitting: *Deleted

## 2022-04-13 NOTE — Telephone Encounter (Signed)
   Name: Hunter Mcbride.  DOB: [birth year]  MRN: [id number]   Primary Cardiologist: Mertie Moores, MD  Chart reviewed as part of pre-operative protocol coverage. Patient was contacted 04/13/2022 in reference to pre-operative risk assessment for pending surgery as outlined below.  Hunter Mcbride. was last seen on 03/09/22 by Diona Browner NP via a virtual visit for clearance to proceed with nerve root block. He did well with that procedure and held plavix for 5 days.   Since that day, Bhavesh Vazquez. has done well. Despite back pain, he is still able to complete well over 4.0 METS without angina.   Given his history of CABG, he may hold Plavix 5 to 7 days prior to lumbar fusion. Please resume Plavix as soon as possible postprocedure, at the discretion of the surgeon. Regarding ASA therapy, we recommend continuation of ASA throughout the perioperative period. However, if the surgeon feels that cessation of ASA is required in the perioperative period, it may be stopped 5-7 days prior to surgery with a plan to resume it as soon as felt to be feasible from a surgical standpoint in the post-operative period.   Therefore, based on ACC/AHA guidelines, the patient would be at acceptable risk for the planned procedure without further cardiovascular testing.   The patient was advised that if he develops new symptoms prior to surgery to contact our office to arrange for a follow-up visit, and he verbalized understanding.  I will route this recommendation to the requesting party via Epic fax function and remove from pre-op pool. Please call with questions.  Tami Lin Corde Antonini, PA 04/13/2022, 11:00 AM

## 2022-04-13 NOTE — Telephone Encounter (Signed)
   Pre-operative Risk Assessment    Patient Name: Hunter Mcbride.  DOB: November 14, 1945 MRN: [id number]      Request for Surgical Clearance    Procedure:   LUMBAR FUSION  Date of Surgery:  Clearance TBD                                 Surgeon:  DR. Duffy Rhody Surgeon's Group or Practice Name:  [facility] Phone number:  [phone] Fax number:  [phone]   Type of Clearance Requested:   - Pharmacy:  Hold Aspirin and Clopidogrel (Plavix) NOT INDICATED HOW [place]   Type of Anesthesia:  General    Additional requests/questions:    Astrid Divine   04/13/2022, 8:03 AM

## 2022-04-15 DIAGNOSIS — M899 Disorder of bone, unspecified: Secondary | ICD-10-CM | POA: Diagnosis not present

## 2022-04-22 ENCOUNTER — Other Ambulatory Visit: Payer: Self-pay | Admitting: Neurosurgery

## 2022-04-25 DIAGNOSIS — M5451 Vertebrogenic low back pain: Secondary | ICD-10-CM | POA: Diagnosis not present

## 2022-04-27 DIAGNOSIS — M5451 Vertebrogenic low back pain: Secondary | ICD-10-CM | POA: Diagnosis not present

## 2022-05-03 DIAGNOSIS — M5451 Vertebrogenic low back pain: Secondary | ICD-10-CM | POA: Diagnosis not present

## 2022-05-06 DIAGNOSIS — M5451 Vertebrogenic low back pain: Secondary | ICD-10-CM | POA: Diagnosis not present

## 2022-05-10 DIAGNOSIS — M5451 Vertebrogenic low back pain: Secondary | ICD-10-CM | POA: Diagnosis not present

## 2022-05-17 ENCOUNTER — Encounter: Payer: Self-pay | Admitting: Podiatry

## 2022-05-17 ENCOUNTER — Ambulatory Visit: Payer: PPO | Admitting: Podiatry

## 2022-05-17 DIAGNOSIS — B351 Tinea unguium: Secondary | ICD-10-CM

## 2022-05-17 DIAGNOSIS — I209 Angina pectoris, unspecified: Secondary | ICD-10-CM | POA: Diagnosis not present

## 2022-05-17 DIAGNOSIS — E1169 Type 2 diabetes mellitus with other specified complication: Secondary | ICD-10-CM | POA: Diagnosis not present

## 2022-05-17 DIAGNOSIS — F418 Other specified anxiety disorders: Secondary | ICD-10-CM | POA: Diagnosis not present

## 2022-05-17 DIAGNOSIS — I25119 Atherosclerotic heart disease of native coronary artery with unspecified angina pectoris: Secondary | ICD-10-CM | POA: Diagnosis not present

## 2022-05-17 DIAGNOSIS — Z794 Long term (current) use of insulin: Secondary | ICD-10-CM | POA: Diagnosis not present

## 2022-05-17 DIAGNOSIS — M79676 Pain in unspecified toe(s): Secondary | ICD-10-CM

## 2022-05-17 DIAGNOSIS — G309 Alzheimer's disease, unspecified: Secondary | ICD-10-CM | POA: Diagnosis not present

## 2022-05-17 DIAGNOSIS — G4752 REM sleep behavior disorder: Secondary | ICD-10-CM | POA: Diagnosis not present

## 2022-05-17 DIAGNOSIS — M519 Unspecified thoracic, thoracolumbar and lumbosacral intervertebral disc disorder: Secondary | ICD-10-CM | POA: Diagnosis not present

## 2022-05-17 DIAGNOSIS — E785 Hyperlipidemia, unspecified: Secondary | ICD-10-CM | POA: Diagnosis not present

## 2022-05-17 DIAGNOSIS — E1142 Type 2 diabetes mellitus with diabetic polyneuropathy: Secondary | ICD-10-CM

## 2022-05-17 DIAGNOSIS — F0280 Dementia in other diseases classified elsewhere without behavioral disturbance: Secondary | ICD-10-CM | POA: Diagnosis not present

## 2022-05-17 DIAGNOSIS — I10 Essential (primary) hypertension: Secondary | ICD-10-CM | POA: Diagnosis not present

## 2022-05-17 DIAGNOSIS — D7589 Other specified diseases of blood and blood-forming organs: Secondary | ICD-10-CM | POA: Diagnosis not present

## 2022-05-17 LAB — LAB REPORT - SCANNED
A1c: 7.7
EGFR: 45.6

## 2022-05-17 NOTE — Progress Notes (Signed)
  Subjective:  Patient ID: Hunter Stakes., male    DOB: [birth year],   MRN: [id number]  Chief Complaint  Patient presents with   Nail Problem    [facility] [id number] A1C-7.1    77 y.o. male presents for concern of thickened elongated and painful nails that are difficult to trim. Requesting to have them trimmed today. Relates burning and tingling in their feet. Patient is diabetic and last A1c was  Lab Results  Component Value Date   HGBA1C 7.3 (H) 07/26/2019   .   PCP:  Crist Infante, MD    . Denies any other pedal complaints. Denies n/v/f/c.   Past Medical History:  Diagnosis Date   Arthritis    "lower back; right shoulder; left thumb; joints" (06/21/2013)   Asthma    "not sure if this is true or not" (06/21/2013)   CAD (coronary artery disease) 06/2013   s/p PTCA/DES x 3 to mid LAD 2015. b. s/p CABG in 2018. c. cath 12/2017 -> med rx.   Cancer ([place])    Clotting disorder ([place])    GERD (gastroesophageal reflux disease)    Gout    "maybe twice in my life"   Headache(784.0)    "weekly for the last 3-4 months" (06/21/2013)   Hyperlipidemia    Hypertension    Myocardial infarction ([place])    "/Dr. Carlton Adam had one between 2014-2015) (06/21/2013)   Neuromuscular disorder ([place])    Osteoarthritis    Sleep apnea    WEIGHT LOSS, NO LONGER NEEDS PER PATIENT   Type II diabetes mellitus ([facility])    TYPE 2    Objective:  Physical Exam: Vascular: DP/PT pulses 2/4 bilateral. CFT <3 seconds. Normal hair growth on digits. No edema.  Skin. No lacerations or abrasions bilateral feet. Left hallux nail bed healing well. Nails 1-5 bilateral are thickened elongated and with subungual debris.  Musculoskeletal: MMT 5/5 bilateral lower extremities in DF, PF, Inversion and Eversion. Deceased ROM in DF of ankle joint. No pain to palpation of the foot.  Neurological: Sensation intact to light touch. Protective sensation intact b/l.   Assessment:   1. Pain due to onychomycosis of toenail   2. Diabetic  peripheral neuropathy associated with type 2 diabetes mellitus       Plan:  Patient was evaluated and treated and all questions answered. -Discussed and educated patient on diabetic foot care, especially with  regards to the vascular, neurological and musculoskeletal systems.  -Stressed the importance of good glycemic control and the detriment of not  controlling glucose levels in relation to the foot. -Discussed supportive shoes at all times and checking feet regularly.  -Mechanically debrided all nails 1-5 bilateral using sterile nail nipper and filed with dremel without incident  -Answered all patient questions -Patient to return  in 3 months for at risk foot care -Patient advised to call the office if any problems or questions arise in the meantime.    Lorenda Peck, DPM

## 2022-05-18 ENCOUNTER — Other Ambulatory Visit: Payer: Self-pay | Admitting: Neurosurgery

## 2022-05-19 NOTE — Progress Notes (Signed)
Surgical Instructions    Your procedure is scheduled on Thursday, 05/26/22.  Report to [facility] Main Entrance "A" at 5:30 A.M., then check in with the Admitting office.  Call this number if you have problems the morning of surgery:  [phone]   If you have any questions prior to your surgery date call [phone]: Open Monday-Friday 8am-4pm If you experience any cold or flu symptoms such as cough, fever, chills, shortness of breath, etc. between now and your scheduled surgery, please notify us at the above number     Remember:  Do not eat or drink after midnight the night before your surgery    Take these medicines the morning of surgery with A SIP OF WATER:  amLODipine (NORVASC)  famotidine (PEPCID)  galantamine (RAZADYNE ER)  pantoprazole (PROTONIX)  ranolazine (RANEXA)   IF NEEDED: acetaminophen (TYLENOL)  ALPRAZolam (XANAX)  promethazine (PHENERGAN)   As of today, STOP taking any Aspirin (unless otherwise instructed by your surgeon) Aleve, Naproxen, Ibuprofen, Motrin, Advil, Goody's, BC's, all herbal medications, fish oil, and all vitamins.  Please stop taking clopidogrel (PLAVIX) 5 days prior to surgery. Your last dose will be 05/20/22.  WHAT DO I DO ABOUT MY DIABETES MEDICATION?   Do not take oral diabetes medicines (pills) the morning of surgery.  Do not take empagliflozin (JARDIANCE) 72 hours prior to surgery. Your last dose will be 05/22/22.  THE NIGHT BEFORE SURGERY, (tresiba?)      THE MORNING OF SURGERY, do not take metFORMIN (GLUCOPHAGE-XR). Take 50% (10 units) of TRESIBA.  The day of surgery, do not take other diabetes injectables, including Byetta (exenatide), Bydureon (exenatide ER), Victoza (liraglutide), or Trulicity (dulaglutide).  If your CBG is greater than 220 mg/dL, you may take  of your sliding scale (correction) dose of insulin.   HOW TO MANAGE YOUR DIABETES BEFORE AND AFTER SURGERY  Why is it important to control my blood sugar before and  after surgery? Improving blood sugar levels before and after surgery helps healing and can limit problems. A way of improving blood sugar control is eating a healthy diet by:  Eating less sugar and carbohydrates  Increasing activity/exercise  Talking with your doctor about reaching your blood sugar goals High blood sugars (greater than 180 mg/dL) can raise your risk of infections and slow your recovery, so you will need to focus on controlling your diabetes during the weeks before surgery. Make sure that the doctor who takes care of your diabetes knows about your planned surgery including the date and location.  How do I manage my blood sugar before surgery? Check your blood sugar at least 4 times a day, starting 2 days before surgery, to make sure that the level is not too high or low.  Check your blood sugar the morning of your surgery when you wake up and every 2 hours until you get to the Short Stay unit.  If your blood sugar is less than 70 mg/dL, you will need to treat for low blood sugar: Do not take insulin. Treat a low blood sugar (less than 70 mg/dL) with  cup of clear juice (cranberry or apple), 4 glucose tablets, OR glucose gel. Recheck blood sugar in 15 minutes after treatment (to make sure it is greater than 70 mg/dL). If your blood sugar is not greater than 70 mg/dL on recheck, call [phone] for further instructions. Report your blood sugar to the short stay nurse when you get to Short Stay.  If you are admitted to the hospital  after surgery: Your blood sugar will be checked by the staff and you will probably be given insulin after surgery (instead of oral diabetes medicines) to make sure you have good blood sugar levels. The goal for blood sugar control after surgery is 80-180 mg/dL.            Do not wear jewelry or makeup. Do not wear lotions, powders, cologne or deodorant. Men may shave face and neck. Do not bring valuables to the hospital. Do not wear nail  polish, gel polish, artificial nails, or any other type of covering on natural nails (fingers and toes) If you have artificial nails or gel coating that need to be removed by a nail salon, please have this removed prior to surgery. Artificial nails or gel coating may interfere with anesthesia's ability to adequately monitor your vital signs.   [facility] is not responsible for any belongings or valuables.    Do NOT Smoke (Tobacco/Vaping)  24 hours prior to your procedure  If you use a CPAP at night, you may bring your mask for your overnight stay.   Contacts, glasses, hearing aids, dentures or partials may not be worn into surgery, please bring cases for these belongings   For patients admitted to the hospital, discharge time will be determined by your treatment team.   Patients discharged the day of surgery will not be allowed to drive home, and someone needs to stay with them for 24 hours.   SURGICAL WAITING ROOM VISITATION Patients having surgery or a procedure may have no more than 2 support people in the waiting area - these visitors may rotate.   Children under the age of 6 must have an adult with them who is not the patient. If the patient needs to stay at the hospital during part of their recovery, the visitor guidelines for inpatient rooms apply. Pre-op nurse will coordinate an appropriate time for 1 support person to accompany patient in pre-op.  This support person may not rotate.   Please refer to [email or web address] for the visitor guidelines for Inpatients (after your surgery is over and you are in a regular room).    Special instructions:    Oral Hygiene is also important to reduce your risk of infection.  Remember - BRUSH YOUR TEETH THE MORNING OF SURGERY WITH YOUR REGULAR TOOTHPASTE   [facility]- Preparing For Surgery  Before surgery, you can play an important role. Because skin is not sterile, your skin needs to be  as free of germs as possible. You can reduce the number of germs on your skin by washing with CHG (chlorahexidine gluconate) Soap before surgery.  CHG is an antiseptic cleaner which kills germs and bonds with the skin to continue killing germs even after washing.     Please do not use if you have an allergy to CHG or antibacterial soaps. If your skin becomes reddened/irritated stop using the CHG.  Do not shave (including legs and underarms) for at least 48 hours prior to first CHG shower. It is OK to shave your face.  Please follow these instructions carefully.     Shower the NIGHT BEFORE SURGERY and the MORNING OF SURGERY with CHG Soap.   If you chose to wash your hair, wash your hair first as usual with your normal shampoo. After you shampoo, rinse your hair and body thoroughly to remove the shampoo.  Then [employer] and genitals (private parts) with your normal soap and rinse thoroughly to remove soap.  After that  Use CHG Soap as you would any other liquid soap. You can apply CHG directly to the skin and wash gently with a scrungie or a clean washcloth.   Apply the CHG Soap to your body ONLY FROM THE NECK DOWN.  Do not use on open wounds or open sores. Avoid contact with your eyes, ears, mouth and genitals (private parts). Wash Face and genitals (private parts)  with your normal soap.   Wash thoroughly, paying special attention to the area where your surgery will be performed.  Thoroughly rinse your body with warm water from the neck down.  DO NOT shower/wash with your normal soap after using and rinsing off the CHG Soap.  Pat yourself dry with a CLEAN TOWEL.  Wear CLEAN PAJAMAS to bed the night before surgery  Place CLEAN SHEETS on your bed the night before your surgery  DO NOT SLEEP WITH PETS.   Day of Surgery: Take a shower with CHG soap. Wear Clean/Comfortable clothing the morning of surgery Do not apply any deodorants/lotions.   Remember to brush your teeth WITH YOUR REGULAR  TOOTHPASTE.    If you received a COVID test during your pre-op visit, it is requested that you wear a mask when out in public, stay away from anyone that may not be feeling well, and notify your surgeon if you develop symptoms. If you have been in contact with anyone that has tested positive in the last 10 days, please notify your surgeon.    Please read over the following fact sheets that you were given.

## 2022-05-20 ENCOUNTER — Encounter (HOSPITAL_COMMUNITY): Payer: Self-pay

## 2022-05-20 ENCOUNTER — Encounter (HOSPITAL_COMMUNITY)
Admission: RE | Admit: 2022-05-20 | Discharge: 2022-05-20 | Disposition: A | Discharge status: Home / Self Care | Payer: PPO | Source: Ambulatory Visit | Attending: Internal Medicine | Admitting: Internal Medicine

## 2022-05-20 ENCOUNTER — Other Ambulatory Visit: Payer: Self-pay

## 2022-05-20 VITALS — HR 59 | Temp 98.0°F | Ht 69.0 in | Wt 198.1 lb

## 2022-05-20 DIAGNOSIS — Z951 Presence of aortocoronary bypass graft: Secondary | ICD-10-CM | POA: Insufficient documentation

## 2022-05-20 DIAGNOSIS — F0390 Unspecified dementia without behavioral disturbance: Secondary | ICD-10-CM | POA: Insufficient documentation

## 2022-05-20 DIAGNOSIS — M1990 Unspecified osteoarthritis, unspecified site: Secondary | ICD-10-CM | POA: Diagnosis not present

## 2022-05-20 DIAGNOSIS — K219 Gastro-esophageal reflux disease without esophagitis: Secondary | ICD-10-CM | POA: Diagnosis not present

## 2022-05-20 DIAGNOSIS — Z7902 Long term (current) use of antithrombotics/antiplatelets: Secondary | ICD-10-CM | POA: Insufficient documentation

## 2022-05-20 DIAGNOSIS — I2510 Atherosclerotic heart disease of native coronary artery without angina pectoris: Secondary | ICD-10-CM | POA: Insufficient documentation

## 2022-05-20 DIAGNOSIS — Z7982 Long term (current) use of aspirin: Secondary | ICD-10-CM | POA: Diagnosis not present

## 2022-05-20 DIAGNOSIS — Z7984 Long term (current) use of oral hypoglycemic drugs: Secondary | ICD-10-CM | POA: Insufficient documentation

## 2022-05-20 DIAGNOSIS — I10 Essential (primary) hypertension: Secondary | ICD-10-CM | POA: Insufficient documentation

## 2022-05-20 DIAGNOSIS — J45909 Unspecified asthma, uncomplicated: Secondary | ICD-10-CM | POA: Diagnosis not present

## 2022-05-20 DIAGNOSIS — Z01812 Encounter for preprocedural laboratory examination: Secondary | ICD-10-CM | POA: Insufficient documentation

## 2022-05-20 DIAGNOSIS — I252 Old myocardial infarction: Secondary | ICD-10-CM | POA: Diagnosis not present

## 2022-05-20 DIAGNOSIS — M48062 Spinal stenosis, lumbar region with neurogenic claudication: Secondary | ICD-10-CM | POA: Insufficient documentation

## 2022-05-20 DIAGNOSIS — E785 Hyperlipidemia, unspecified: Secondary | ICD-10-CM | POA: Diagnosis not present

## 2022-05-20 DIAGNOSIS — E1159 Type 2 diabetes mellitus with other circulatory complications: Secondary | ICD-10-CM | POA: Diagnosis not present

## 2022-05-20 DIAGNOSIS — Z01818 Encounter for other preprocedural examination: Secondary | ICD-10-CM

## 2022-05-20 LAB — CBC
HCT: 46.1 % (ref 39.0–52.0)
Hemoglobin: 15.1 g/dL (ref 13.0–17.0)
MCH: 30.6 pg (ref 26.0–34.0)
MCHC: 32.8 g/dL (ref 30.0–36.0)
MCV: 93.5 fL (ref 80.0–100.0)
Platelets: 127 10*3/uL — ABNORMAL LOW (ref 150–400)
RBC: 4.93 MIL/uL (ref 4.22–5.81)
RDW: 13.4 % (ref 11.5–15.5)
WBC: 3.4 10*3/uL — ABNORMAL LOW (ref 4.0–10.5)
nRBC: 0 % (ref 0.0–0.2)

## 2022-05-20 LAB — BASIC METABOLIC PANEL
Anion gap: 10 (ref 5–15)
BUN: 22 mg/dL (ref 8–23)
CO2: 27 mmol/L (ref 22–32)
Calcium: 9.4 mg/dL (ref 8.9–10.3)
Chloride: 102 mmol/L (ref 98–111)
Creatinine, Ser: 1.07 mg/dL (ref 0.61–1.24)
GFR, Estimated: >60 mL/min (ref >60)
Glucose, Bld: 247 mg/dL — ABNORMAL HIGH (ref 70–99)
Potassium: 4.6 mmol/L (ref 3.5–5.1)
Sodium: 139 mmol/L (ref 135–145)

## 2022-05-20 LAB — SURGICAL PCR SCREEN
MRSA, PCR: NEGATIVE
Staphylococcus aureus: POSITIVE — AB

## 2022-05-20 LAB — GLUCOSE, CAPILLARY: Glucose-Capillary: 373 mg/dL — ABNORMAL HIGH (ref 70–99)

## 2022-05-20 LAB — TYPE AND SCREEN
ABO/RH(D): O POS
Antibody Screen: NEGATIVE

## 2022-05-20 NOTE — Progress Notes (Signed)
Surgical Instructions    Your procedure is scheduled on Thursday, 05/26/22.  Report to [facility] Main Entrance "A" at 5:30 A.M., then check in with the Admitting office.  Call this number if you have problems the morning of surgery:  [phone]   If you have any questions prior to your surgery date call [phone]: Open Monday-Friday 8am-4pm If you experience any cold or flu symptoms such as cough, fever, chills, shortness of breath, etc. between now and your scheduled surgery, please notify us at the above number     Remember:  Do not eat or drink after midnight the night before your surgery    Take these medicines the morning of surgery with A SIP OF WATER:  amLODipine (NORVASC)  famotidine (PEPCID)  galantamine (RAZADYNE ER)  pantoprazole (PROTONIX)  ranolazine (RANEXA)   IF NEEDED: acetaminophen (TYLENOL)  ALPRAZolam (XANAX)  promethazine (PHENERGAN)   As of today, STOP taking any Aspirin (unless otherwise instructed by your surgeon) Aleve, Naproxen, Ibuprofen, Motrin, Advil, Goody's, BC's, all herbal medications, fish oil, and all vitamins.  Please stop taking clopidogrel (PLAVIX) 5 days prior to surgery. Your last dose will be 05/20/22.  WHAT DO I DO ABOUT MY DIABETES MEDICATION?   Do not take oral diabetes medicines (pills) the morning of surgery.  Do not take empagliflozin (JARDIANCE) 72 hours prior to surgery. Your last dose will be 05/22/22.  THE NIGHT BEFORE SURGERY, (tresiba?)      THE MORNING OF SURGERY, do not take metFORMIN (GLUCOPHAGE-XR). Take 50% (10 units) of TRESIBA.  The day of surgery, do not take other diabetes injectables, including Byetta (exenatide), Bydureon (exenatide ER), Victoza (liraglutide), or Trulicity (dulaglutide).  If your CBG is greater than 220 mg/dL, you may take  of your sliding scale (correction) dose of insulin.   HOW TO MANAGE YOUR DIABETES BEFORE AND AFTER SURGERY  Why is it important to control my blood sugar before and  after surgery? Improving blood sugar levels before and after surgery helps healing and can limit problems. A way of improving blood sugar control is eating a healthy diet by:  Eating less sugar and carbohydrates  Increasing activity/exercise  Talking with your doctor about reaching your blood sugar goals High blood sugars (greater than 180 mg/dL) can raise your risk of infections and slow your recovery, so you will need to focus on controlling your diabetes during the weeks before surgery. Make sure that the doctor who takes care of your diabetes knows about your planned surgery including the date and location.  How do I manage my blood sugar before surgery? Check your blood sugar at least 4 times a day, starting 2 days before surgery, to make sure that the level is not too high or low.  Check your blood sugar the morning of your surgery when you wake up and every 2 hours until you get to the Short Stay unit.  If your blood sugar is less than 70 mg/dL, you will need to treat for low blood sugar: Do not take insulin. Treat a low blood sugar (less than 70 mg/dL) with  cup of clear juice (cranberry or apple), 4 glucose tablets, OR glucose gel. Recheck blood sugar in 15 minutes after treatment (to make sure it is greater than 70 mg/dL). If your blood sugar is not greater than 70 mg/dL on recheck, call [phone] for further instructions. Report your blood sugar to the short stay nurse when you get to Short Stay.  If you are admitted to the hospital  after surgery: Your blood sugar will be checked by the staff and you will probably be given insulin after surgery (instead of oral diabetes medicines) to make sure you have good blood sugar levels. The goal for blood sugar control after surgery is 80-180 mg/dL.            Do not wear jewelry or makeup. Do not wear lotions, powders, cologne or deodorant. Men may shave face and neck. Do not bring valuables to the hospital. Do not wear nail  polish, gel polish, artificial nails, or any other type of covering on natural nails (fingers and toes) If you have artificial nails or gel coating that need to be removed by a nail salon, please have this removed prior to surgery. Artificial nails or gel coating may interfere with anesthesia's ability to adequately monitor your vital signs.  [facility] is not responsible for any belongings or valuables.    Do NOT Smoke (Tobacco/Vaping)  24 hours prior to your procedure  If you use a CPAP at night, you may bring your mask for your overnight stay.   Contacts, glasses, hearing aids, dentures or partials may not be worn into surgery, please bring cases for these belongings   For patients admitted to the hospital, discharge time will be determined by your treatment team.   Patients discharged the day of surgery will not be allowed to drive home, and someone needs to stay with them for 24 hours.   SURGICAL WAITING ROOM VISITATION Patients having surgery or a procedure may have no more than 2 support people in the waiting area - these visitors may rotate.   Children under the age of 41 must have an adult with them who is not the patient. If the patient needs to stay at the hospital during part of their recovery, the visitor guidelines for inpatient rooms apply. Pre-op nurse will coordinate an appropriate time for 1 support person to accompany patient in pre-op.  This support person may not rotate.   Please refer to [email or web address] for the visitor guidelines for Inpatients (after your surgery is over and you are in a regular room).    Oral Hygiene is also important to reduce your risk of infection.  Remember - BRUSH YOUR TEETH THE MORNING OF SURGERY WITH YOUR REGULAR TOOTHPASTE   Day of Surgery: Take a shower with CHG soap. Wear Clean/Comfortable clothing the morning of surgery Do not apply any deodorants/lotions.   Remember to brush  your teeth WITH YOUR REGULAR TOOTHPASTE.    If you received a COVID test during your pre-op visit, it is requested that you wear a mask when out in public, stay away from anyone that may not be feeling well, and notify your surgeon if you develop symptoms. If you have been in contact with anyone that has tested positive in the last 10 days, please notify your surgeon.    Please read over the following fact sheets that you were given.

## 2022-05-20 NOTE — Progress Notes (Signed)
PCP -Rodrigo Ran, MD  Cardiologist - Leodis Sias  PPM/ICD - denies Device Orders - n/a Rep Notified - n/a  Chest x-ray - 05/24/2021 EKG - 08/02/2021 Stress Test - 07/31/15 ECHO - 11/03/2016 Cardiac Cath - 07/26/19  Sleep Study - yes, negative CPAP - n/a  Fasting Blood Sugar - 70-90 Checks Blood Sugar 3 times a day  Blood Thinner Instructions: As of today, STOP taking any Aspirin (unless otherwise instructed by your surgeon) Aleve, Naproxen, Ibuprofen, Motrin, Advil, Goody's, BC's, all herbal medications, fish oil, and all vitamins.   Please stop taking clopidogrel (PLAVIX) 5 days prior to surgery. Your last dose will be 05/20/22.   ERAS Protcol -no   COVID TEST- no   Anesthesia review: yes, cardiac clearance   Patient denies shortness of breath, fever, cough and chest pain at PAT appointment and for the last 2 months.   All instructions explained to the patient, with a verbal understanding of the material. Patient agrees to go over the instructions while at home for a better understanding. Patient also instructed to self quarantine after being tested for COVID-19. The opportunity to ask questions was provided.

## 2022-05-23 ENCOUNTER — Encounter (HOSPITAL_COMMUNITY): Payer: Self-pay

## 2022-05-23 NOTE — Anesthesia Preprocedure Evaluation (Addendum)
Anesthesia Evaluation    Reviewed: Allergy & Precautions, Patient's Chart, lab work & pertinent test results  Airway Mallampati: II  TM Distance: >3 FB Neck ROM: Full    Dental no notable dental hx.    Pulmonary    Pulmonary exam normal        Cardiovascular hypertension, Pt. on medications and Pt. on home beta blockers + CAD, + Past MI, + Cardiac Stents and + CABG  Normal cardiovascular exam     Neuro/Psych  Headaches PSYCHIATRIC DISORDERS Anxiety    Dementia  Neuromuscular disease    GI/Hepatic Neg liver ROS,GERD  Medicated and Controlled,,  Endo/Other  diabetes, Oral Hypoglycemic Agents    Renal/GU negative Renal ROS     Musculoskeletal  (+) Arthritis ,    Abdominal   Peds  Hematology  (+) Blood dyscrasia (Plavix)   Anesthesia Other Findings LUMBAR SRENOSIS WITH NEUROGENIC CLAUDICATION  Reproductive/Obstetrics                             Anesthesia Physical Anesthesia Plan  ASA: 3  Anesthesia Plan: General   Post-op Pain Management:    Induction: Intravenous  PONV Risk Score and Plan: 2 and Ondansetron, Dexamethasone and Treatment may vary due to age or medical condition  Airway Management Planned: Oral ETT  Additional Equipment: ClearSight  Intra-op Plan:   Post-operative Plan: Extubation in OR  Informed Consent: I have reviewed the patients History and Physical, chart, labs and discussed the procedure including the risks, benefits and alternatives for the proposed anesthesia with the patient or authorized representative who has indicated his/her understanding and acceptance.     Dental advisory given  Plan Discussed with: CRNA  Anesthesia Plan Comments: (PAT note written 05/23/2022 by Shonna Chock, PA-C. Potential arterial line placement discussed )       Anesthesia Quick Evaluation

## 2022-05-23 NOTE — Progress Notes (Signed)
Anesthesia Chart Review:  Case: [id number] Date/Time: 05/26/22 0715   Procedures:      PLIF L45; EXPLORE FUSION L5S1 - 3C     Application of O-Arm   Anesthesia type: General   Pre-op diagnosis: LUMBAR SRENOSIS WITH NEUROGENIC CLAUDICATION   [facility] / [facility]   Surgeons: Bedelia Personhomas, Jonathan G, MD       DISCUSSION: Patient is a 77 year old male scheduled for the above procedure.   History includes never smoker, HTN, HLD, CAD (MI s/p PTCA/DES X 3 mid LAD 06/21/13; CABG: LIMA-LAD, SVG-DIAG, SVG-OM1, SVG-pPLA-dPDA 11/07/16), DM2, GERD, OSA (s/p weight loss; no OSA, + REM sleep disorder 07/11/21 sleep study), asthma, dementia, osteoarthritis (right TSA 03/01/12; right THA 04/02/16), skin cancer, spinal surgery (L5-S1 fusion 07/25/18, complicated by Citrobacter koseri postoperative spinal hardware infection, s/p I&D 08/27/18, 08/31/18, s/p 12 weeks Cipro per ID)   Clotting disorder was added to his history by RN on 01/26/21. I called and spoke with him. Wife was also beside him.  He denied any history of congential clotting disorder. He is on Plavix which may cause easier bruising or bleeding, but other he denied a clotting disorder. I removed clotting disorder from his history. Wife confirmed his last ASA and Plavix doses were 05/20/22 in preparation for surgery.   Preoperative cardiology input outlined by Micah Flesheruke, Angela, PA on 04/13/22, "Hunter Heringobert D Swatek Jr. was last seen on 03/09/22 by Bernadene PersonEmily Monge NP via a virtual visit for clearance to proceed with nerve root block. He did well with that procedure and held plavix for 5 days.   Since that day, Hunter HeringRobert D Privitera Jr. has done well. Despite back pain, he is still able to complete well over 4.0 METS without angina.    Given his history of CABG, he may hold Plavix 5 to 7 days prior to lumbar fusion. Please resume Plavix as soon as possible postprocedure, at the discretion of the surgeon. Regarding ASA therapy, we recommend continuation of ASA throughout the  perioperative period. However, if the surgeon feels that cessation of ASA is required in the perioperative period, it may be stopped 5-7 days prior to surgery with a plan to resume it as soon as felt to be feasible from a surgical standpoint in the post-operative period.    Therefore, based on ACC/AHA guidelines, the patient would be at acceptable risk for the planned procedure without further cardiovascular testing..."   His CBG at PAT was 373 (247 on BMP). He said he had cereal, mild, coffee, bananas, Mt. Dew within a few hours of PAT, otherwise he says glucose readings are typically ~ 150 or better. His A1c was 7.7% on 05/17/22 at Dr. Laurey MoralePerini's office. He is on Bahrainresiba and Jardiance. Last Jardiance 05/22/22.   Anesthesia team to evaluate on the day of surgery. He asked if he would require foley catheter. With type of surgery and length of surgery, I would anticipate he would have a foley catheter intra-op and likely for up to 24 hours after surgery, but advised he discuss specifics with his surgical and anesthesia team on the day of surgery--he reported discomfort when foley removed in the past, but otherwise denied urinary symptoms.     VS: Pulse (!) 59   Temp 36.7 C (Oral)   Ht 5\' 9"  (1.753 m)   Wt 89.9 kg   SpO2 99%   BMI 29.25 kg/m  No BP is recorded from 05/20/22 PAT visit but was 110/70 at 05/17/22 PCP visit  PROVIDERS: Rodrigo Ran, MD is PCP at [facility]) Kristeen Miss, MD is cardiologist Dohmeier, Porfirio Mylar, MD is neurologist   LABS: Preoperative labs noted.  (all labs ordered are listed, but only abnormal results are displayed)  Labs Reviewed  SURGICAL PCR SCREEN - Abnormal; Notable for the following components:      Result Value   Staphylococcus aureus POSITIVE (*)    All other components within normal limits  GLUCOSE, CAPILLARY - Abnormal; Notable for the following components:   Glucose-Capillary 373 (*)    All other components within normal limits   BASIC METABOLIC PANEL - Abnormal; Notable for the following components:   Glucose, Bld 247 (*)    All other components within normal limits  CBC - Abnormal; Notable for the following components:   WBC 3.4 (*)    Platelets 127 (*)    All other components within normal limits  TYPE AND SCREEN   A1c 7.7% on 05/17/22 and AST 22, ALT 31 05/19/22 ([facility])    Parasomnia Polysomnogram 07/11/21: IMPRESSION: No evidence of Obstructive Sleep Apnea (OSA) There was a cluster of highly active PLMs- Periodic Limb Movement Disorder (PLMD) which reached into REM sleep and would therefore be considered diagnostic of REM BD. REM sleep somniloquism. RECOMMENDATIONS: Treatment for REM BD includes melatonin, and often requires Klonopin as well.  Our follow up appointment will be directed towards those disorders associated with REM BD, such as Parkinson's disease and Lewy body disease.  That will include MMSE/ MOCA testing and PD DAT scan.    IMAGES: CT L-spine 04/08/22: IMPRESSION: 1. Postsurgical changes from L5-S1 posterior spinal fusion and L5 laminectomy. No evidence of hardware complications 2. Severe spinal canal narrowing at L4-L5, which is better assessed on recent prior MRI of the lumbar spine. Severe right neural foraminal narrowing and moderate to severe left neural foraminal narrowing at this level. 3. Likely moderate right and mild left neural foraminal narrowing at L5-S1. 4. Asymmetric atrophy of the right piriformis muscle.   NM PET Metabolic Brain Scan 10/22/21: IMPRESSION: 1. Mild bilateral decreased hypermetabolism within the parietotemporal underlying cortices with normal cortical uptake in the frontal lobes. Which may reflect mild cognitive impairment versus or early Alzheimer's type dementia.   EKG: 03/04/2021 ([facility]): Sinus bradycardia at 55 bpm.  Voltage criteria for left ventricular hypertrophy.  Inferior infarct, age undetermined.   CV: Cardiac cath 07/26/19: LIMA  graft was visualized by angiography and is normal in caliber. The graft exhibits no disease. Prox RCA lesion is 50% stenosed. Mid RCA lesion is 85% stenosed. The left ventricular systolic function is normal. LV end diastolic pressure is mildly elevated. The left ventricular ejection fraction is 55-65% by visual estimate. SVG graft was visualized by angiography and is normal in caliber. The graft exhibits no disease. 2nd Diag lesion is 90% stenosed. 1st Mrg lesion is 80% stenosed. SVG graft was visualized by angiography and is normal in caliber. The graft exhibits no disease. Prox LAD to Mid LAD lesion is 30% stenosed. Ost LM lesion is 60% stenosed. Prox Cx to Mid Cx lesion is 80% stenosed. SVG graft was visualized by angiography. Prox Graft lesion is 40% stenosed.   1.  Significant underlying three-vessel coronary artery disease with patent grafts including LIMA to LAD, SVG to second diagonal, SVG to OM and SVG to right PL/PDA.  There is progression of native coronary artery disease including the RCA and also ostial left main.  However, the LIMA is no longer atretic. 2.  Normal LV  systolic function mildly elevated left ventricular end-diastolic pressure.   Recommendations: No clear explanation of why the patient's anginal symptoms have worsened.  Recommend continuing aggressive medical therapy.  Treating the ostial left main with PCI might improve the flow to the distal left circumflex.  However, risks probably outweigh the benefit given that the distal distribution is small and does get some retrograde flow from SVG to OM.   TEE (Intra-Op CABG) 11/07/16:  Left ventricle: Normal cavity size and wall thickness. LV systolic  function is low normal with an EF of 50-55%. There are no obvious wall  motion abnormalities.   Aortic valve: No stenosis. Trace regurgitation.   Mitral valve: Trace regurgitation.   Right ventricle: Normal cavity size, wall thickness and ejection  fraction.     TTE 11/03/16: - Left ventricle: The cavity size was normal. Systolic function was    normal. The estimated ejection fraction was in the range of 60%    to 65%. Wall motion was normal; there were no regional wall    motion abnormalities. Doppler parameters are consistent with    abnormal left ventricular relaxation (grade 1 diastolic    dysfunction). There was no evidence of elevated ventricular    filling pressure by Doppler parameters.  - Aortic valve: There was no regurgitation.  - Aortic root: The aortic root was normal in size.  - Mitral valve: There was trivial regurgitation.  - Left atrium: The atrium was normal in size.  - Right ventricle: Systolic function was normal.  - Tricuspid valve: There was mild regurgitation.  - Pulmonic valve: There was trivial regurgitation.  - Pulmonary arteries: Systolic pressure was within the normal    range.  - Inferior vena cava: The vessel was normal in size.  - Pericardium, extracardiac: There was no pericardial effusion.    Carotid U/S 10/28/16:  - The vertebral arteries appear patent with antegrade flow. - Findings consistent with a 1-39 percent stenosis involving the   right internal carotid artery and the left internal carotid   artery. Tortuous distal ICA&'s bialterally.   Past Medical History:  Diagnosis Date   Arthritis    "lower back; right shoulder; left thumb; joints" (06/21/2013)   Asthma    "not sure if this is true or not" (06/21/2013)   CAD (coronary artery disease) 06/2013   s/p PTCA/DES x 3 to mid LAD 2015. b. s/p CABG in 2018. c. cath 12/2017 -> med rx.   Dementia 2023   per pt's wife   GERD (gastroesophageal reflux disease)    Gout    "maybe twice in my life"   Headache(784.0)    "weekly for the last 3-4 months" (06/21/2013)   Hyperlipidemia    Hypertension    Myocardial infarction    "/Dr. Alan Mulder had one between 2014-2015) (06/21/2013)   Neuromuscular disorder    Osteoarthritis    Skin cancer; face 2023    Sleep apnea    WEIGHT LOSS, NO LONGER NEEDS PER PATIENT   Type II diabetes mellitus    TYPE 2    Past Surgical History:  Procedure Laterality Date   CARDIAC CATHETERIZATION  1980's   "once"   CARDIAC CATHETERIZATION N/A 07/23/2015   Procedure: Left Heart Cath and Coronary Angiography;  Surgeon: Tonny Bollman, MD;  Location: [facility] INVASIVE CV LAB;  Service: Cardiovascular;  Laterality: N/A;   CARDIOVASCULAR STRESS TEST  06/10/2008   EF 68%   CARPAL TUNNEL RELEASE Bilateral    COLONOSCOPY     CORONARY  ANGIOPLASTY WITH STENT PLACEMENT  06/21/2013   "3"   CORONARY ARTERY BYPASS GRAFT N/A 11/07/2016   Procedure: CORONARY ARTERY BYPASS GRAFTING (CABG) x 5 (LIMA to DISTAL LAD, SVG to DIAGONAL, SVG to CIRCUMFLEX, and SVG SEQUENTIALLY to PLB and DISTAL PDA) with EVH of the RIGHT GREATER SAPHENOUS VEIN and LEFT INTERNAL MAMMARY ARTERY HARVEST;  Surgeon: Delight Ovens, MD;  Location: [facility];  Service: Open Heart Surgery;  Laterality: N/A;   CORONARY PRESSURE/FFR STUDY N/A 10/28/2016   Procedure: INTRAVASCULAR PRESSURE WIRE/FFR STUDY;  Surgeon: Marykay Lex, MD;  Location: [facility] INVASIVE CV LAB;  Service: Cardiovascular;  Laterality: N/A;   CORONARY STENT INTERVENTION N/A 06/07/2016   Procedure: Coronary Stent Intervention;  Surgeon: Kathleene Hazel, MD;  [facility]: [facility] INVAS[facility] CV LAB;  Service: Cardiovascular;  Laterality: N/A;   EXCISION MORTON'S NEUROMA Left    EYE SURGERY Left    "removed film over my eye"   FRACTIONAL FLOW RESERVE WIRE N/A 06/26/2013   Procedure: FRACTIONAL FLOW RESERVE WIRE;  Surgeon: Iran Ouch, MD;  Location: [facility] CATH LAB;  Service: Cardiovascular;  Laterality: N/A;   HERNIA REPAIR Left    JOINT REPLACEMENT Left 03/2013   "thumb"   LEFT HEART CATH AND CORONARY ANGIOGRAPHY N/A 06/07/2016   Procedure: Left Heart Cath and Coronary Angiography;  Surgeon: Kathleene Hazel, MD;  Location: [facility] INVASIVE CV LAB;  Service: Cardiovascular;  Laterality: N/A;    LEFT HEART CATH AND CORONARY ANGIOGRAPHY N/A 10/28/2016   Procedure: LEFT HEART CATH AND CORONARY ANGIOGRAPHY;  Surgeon: Marykay Lex, MD;  Location: [facility] INVASIVE CV LAB;  Service: Cardiovascular;  Laterality: N/A;   LEFT HEART CATH AND CORS/GRAFTS ANGIOGRAPHY N/A 12/25/2017   Procedure: LEFT HEART CATH AND CORS/GRAFTS ANGIOGRAPHY;  Surgeon: Tonny Bollman, MD;  Location: [facility] INVASIVE CV LAB;  Service: Cardiovascular;  Laterality: N/A;   LEFT HEART CATH AND CORS/GRAFTS ANGIOGRAPHY N/A 07/26/2019   Procedure: LEFT HEART CATH AND CORS/GRAFTS ANGIOGRAPHY;  Surgeon: Iran Ouch, MD;  [facility] INVAS[facility] CV LAB;  Service: Cardiovascular;  Laterality: N/A;   LEFT HEART CATHETERIZATION WITH CORONARY ANGIOGRAM N/A 06/21/2013   Procedure: LEFT HEART CATHETERIZATION WITH CORONARY ANGIOGRAM;  Surgeon: Kathleene Hazel, MD;  Location: [facility] CATH LAB;  Service: Cardiovascular;  Laterality: N/A;   LEFT HEART CATHETERIZATION WITH CORONARY ANGIOGRAM N/A 06/26/2013   Procedure: LEFT HEART CATHETERIZATION WITH CORONARY ANGIOGRAM;  Surgeon: Iran Ouch, MD;  Location: [facility] CATH LAB;  Service: Cardiovascular;  Laterality: N/A;   LUMBAR LAMINECTOMY/DECOMPRESSION MICRODISCECTOMY Left 05/30/2018   Procedure: Left  Gill L5 decompression;  Surgeon: Venita Lick, MD;  Location: [facility] OR;  Service: Orthopedics;  Laterality: Left;    POLYPECTOMY     TEE WITHOUT CARDIOVERSION N/A 11/07/2016   Procedure: TRANSESOPHAGEAL ECHOCARDIOGRAM (TEE);  Surgeon: Delight Ovens, MD;  Location: [facility] OR;  Service: Open Heart Surgery;  Laterality: N/A;   TONSILLECTOMY     TOTAL HIP ARTHROPLASTY Right 04/12/2016   Procedure: RIGHT TOTAL HIP ARTHROPLASTY ANTERIOR APPROACH;  Surgeon: Durene Romans, MD;  Location: [facility] ORS;  Service: Orthopedics;  Laterality: Right;  requests 70 mins   TOTAL SHOULDER ARTHROPLASTY  03/01/2012   Procedure: TOTAL SHOULDER ARTHROPLASTY;  Surgeon: Senaida Lange, MD;  Location: [facility] OR;  Service:  Orthopedics;  Laterality: Right;   UMBILICAL HERNIA REPAIR      MEDICATIONS:  acetaminophen (TYLENOL) 325 MG tablet   ALPRAZolam (XANAX) 0.5 MG tablet   amLODipine (NORVASC) 5 MG tablet   aspirin EC  81 MG tablet   atenolol (TENORMIN) 50 MG tablet   atorvastatin (LIPITOR) 80 MG tablet   calcium carbonate (TUMS - DOSED IN MG ELEMENTAL CALCIUM) 500 MG chewable tablet   ciclopirox (LOPROX) 0.77 % cream   clopidogrel (PLAVIX) 75 MG tablet   docusate sodium (COLACE) 100 MG capsule   empagliflozin (JARDIANCE) 25 MG TABS tablet   famotidine (PEPCID) 40 MG tablet   FREESTYLE TEST STRIPS test strip   gabapentin (NEURONTIN) 300 MG capsule   galantamine (RAZADYNE ER) 8 MG 24 hr capsule   hydrochlorothiazide (HYDRODIURIL) 25 MG tablet   irbesartan (AVAPRO) 300 MG tablet   isosorbide mononitrate (IMDUR) 30 MG 24 hr tablet   metFORMIN (GLUCOPHAGE-XR) 500 MG 24 hr tablet   mupirocin ointment (BACTROBAN) 2 %   Naftifine HCl 2 % CREA   nitroGLYCERIN (NITROSTAT) 0.4 MG SL tablet   pantoprazole (PROTONIX) 40 MG tablet   polyethylene glycol (MIRALAX / GLYCOLAX) 17 g packet   promethazine (PHENERGAN) 12.5 MG tablet   ranolazine (RANEXA) 500 MG 12 hr tablet   TRESIBA FLEXTOUCH 100 UNIT/ML FlexTouch Pen   triamcinolone cream (KENALOG) 0.1 %   Vitamin D, Cholecalciferol, 25 MCG (1000 UT) CAPS   No current facility-administered medications for this encounter.    Shonna Chock, PA-C Surgical Short Stay/Anesthesiology [facility] Phone [phone] [facility] Phone [phone] 05/23/2022 1:33 PM

## 2022-05-26 ENCOUNTER — Inpatient Hospital Stay (HOSPITAL_COMMUNITY): Payer: PPO | Admitting: Emergency Medicine

## 2022-05-26 ENCOUNTER — Inpatient Hospital Stay (HOSPITAL_COMMUNITY): Payer: PPO

## 2022-05-26 ENCOUNTER — Inpatient Hospital Stay (HOSPITAL_COMMUNITY)
Admission: RE | Admit: 2022-05-26 | Discharge: 2022-05-27 | DRG: 455 | Disposition: A | Discharge status: Home / Self Care | Payer: PPO | Attending: Neurosurgery | Admitting: Neurosurgery

## 2022-05-26 ENCOUNTER — Encounter (HOSPITAL_COMMUNITY): Payer: Self-pay | Admitting: *Deleted

## 2022-05-26 ENCOUNTER — Other Ambulatory Visit: Payer: Self-pay

## 2022-05-26 ENCOUNTER — Inpatient Hospital Stay (HOSPITAL_COMMUNITY)
Admission: RE | Disposition: A | Discharge status: Home / Self Care | Payer: Self-pay | Source: Home / Self Care | Attending: Neurosurgery

## 2022-05-26 DIAGNOSIS — K219 Gastro-esophageal reflux disease without esophagitis: Secondary | ICD-10-CM | POA: Diagnosis present

## 2022-05-26 DIAGNOSIS — I10 Essential (primary) hypertension: Secondary | ICD-10-CM

## 2022-05-26 DIAGNOSIS — Z955 Presence of coronary angioplasty implant and graft: Secondary | ICD-10-CM | POA: Diagnosis not present

## 2022-05-26 DIAGNOSIS — M48061 Spinal stenosis, lumbar region without neurogenic claudication: Principal | ICD-10-CM | POA: Diagnosis present

## 2022-05-26 DIAGNOSIS — I2510 Atherosclerotic heart disease of native coronary artery without angina pectoris: Secondary | ICD-10-CM | POA: Diagnosis not present

## 2022-05-26 DIAGNOSIS — Z7984 Long term (current) use of oral hypoglycemic drugs: Secondary | ICD-10-CM

## 2022-05-26 DIAGNOSIS — M4726 Other spondylosis with radiculopathy, lumbar region: Principal | ICD-10-CM | POA: Diagnosis present

## 2022-05-26 DIAGNOSIS — Z83719 Family history of colon polyps, unspecified: Secondary | ICD-10-CM

## 2022-05-26 DIAGNOSIS — E119 Type 2 diabetes mellitus without complications: Secondary | ICD-10-CM | POA: Diagnosis present

## 2022-05-26 DIAGNOSIS — I252 Old myocardial infarction: Secondary | ICD-10-CM

## 2022-05-26 DIAGNOSIS — G4730 Sleep apnea, unspecified: Secondary | ICD-10-CM | POA: Diagnosis present

## 2022-05-26 DIAGNOSIS — Z951 Presence of aortocoronary bypass graft: Secondary | ICD-10-CM

## 2022-05-26 DIAGNOSIS — Z833 Family history of diabetes mellitus: Secondary | ICD-10-CM

## 2022-05-26 DIAGNOSIS — E1149 Type 2 diabetes mellitus with other diabetic neurological complication: Secondary | ICD-10-CM | POA: Diagnosis not present

## 2022-05-26 DIAGNOSIS — Z96641 Presence of right artificial hip joint: Secondary | ICD-10-CM | POA: Diagnosis present

## 2022-05-26 DIAGNOSIS — Z981 Arthrodesis status: Secondary | ICD-10-CM | POA: Diagnosis not present

## 2022-05-26 DIAGNOSIS — M5116 Intervertebral disc disorders with radiculopathy, lumbar region: Secondary | ICD-10-CM | POA: Diagnosis not present

## 2022-05-26 DIAGNOSIS — M4326 Fusion of spine, lumbar region: Secondary | ICD-10-CM | POA: Diagnosis not present

## 2022-05-26 DIAGNOSIS — E785 Hyperlipidemia, unspecified: Secondary | ICD-10-CM | POA: Diagnosis not present

## 2022-05-26 DIAGNOSIS — Z96611 Presence of right artificial shoulder joint: Secondary | ICD-10-CM | POA: Diagnosis not present

## 2022-05-26 DIAGNOSIS — E1159 Type 2 diabetes mellitus with other circulatory complications: Secondary | ICD-10-CM

## 2022-05-26 DIAGNOSIS — Z888 Allergy status to other drugs, medicaments and biological substances status: Secondary | ICD-10-CM | POA: Diagnosis not present

## 2022-05-26 DIAGNOSIS — Z79899 Other long term (current) drug therapy: Secondary | ICD-10-CM | POA: Diagnosis not present

## 2022-05-26 DIAGNOSIS — Z885 Allergy status to narcotic agent status: Secondary | ICD-10-CM

## 2022-05-26 DIAGNOSIS — J45909 Unspecified asthma, uncomplicated: Secondary | ICD-10-CM | POA: Diagnosis not present

## 2022-05-26 DIAGNOSIS — Z7902 Long term (current) use of antithrombotics/antiplatelets: Secondary | ICD-10-CM

## 2022-05-26 DIAGNOSIS — Z8249 Family history of ischemic heart disease and other diseases of the circulatory system: Secondary | ICD-10-CM

## 2022-05-26 DIAGNOSIS — Z85828 Personal history of other malignant neoplasm of skin: Secondary | ICD-10-CM

## 2022-05-26 DIAGNOSIS — M109 Gout, unspecified: Secondary | ICD-10-CM | POA: Diagnosis not present

## 2022-05-26 DIAGNOSIS — F0390 Unspecified dementia without behavioral disturbance: Secondary | ICD-10-CM | POA: Diagnosis present

## 2022-05-26 LAB — GLUCOSE, CAPILLARY
Glucose-Capillary: 100 mg/dL — ABNORMAL HIGH (ref 70–99)
Glucose-Capillary: 92 mg/dL (ref 70–99)
Glucose-Capillary: 136 mg/dL — ABNORMAL HIGH (ref 70–99)
Glucose-Capillary: 262 mg/dL — ABNORMAL HIGH (ref 70–99)

## 2022-05-26 SURGERY — POSTERIOR LUMBAR FUSION 1 LEVEL
Anesthesia: General

## 2022-05-26 MED ORDER — GABAPENTIN 300 MG PO CAPS
300.0000 mg | ORAL_CAPSULE | Freq: Every day | ORAL | Status: DC
  Administered 2022-05-26: 300 mg via ORAL
  Filled 2022-05-26: qty 1

## 2022-05-26 MED ORDER — CEFAZOLIN SODIUM-DEXTROSE 2-4 GM/100ML-% IV SOLN
INTRAVENOUS | Status: AC
  Filled 2022-05-26: qty 100

## 2022-05-26 MED ORDER — CALCIUM CARBONATE ANTACID 500 MG PO CHEW: 2.0000 | CHEWABLE_TABLET | ORAL | Status: DC | PRN

## 2022-05-26 MED ORDER — MENTHOL 3 MG MT LOZG: 1.0000 | LOZENGE | OROMUCOSAL | Status: DC | PRN

## 2022-05-26 MED ORDER — METHOCARBAMOL 1000 MG/10ML IJ SOLN: 500.0000 mg | Freq: Four times a day (QID) | INTRAVENOUS | Status: DC | PRN

## 2022-05-26 MED ORDER — VANCOMYCIN HCL 1000 MG IV SOLR
INTRAVENOUS | Status: DC | PRN
  Administered 2022-05-26: 1000 mg

## 2022-05-26 MED ORDER — ISOSORBIDE MONONITRATE ER 60 MG PO TB24
30.0000 mg | ORAL_TABLET | Freq: Every day | ORAL | Status: DC
  Administered 2022-05-26: 30 mg via ORAL
  Filled 2022-05-26: qty 1

## 2022-05-26 MED ORDER — SODIUM CHLORIDE 0.9 % IV SOLN: 250.0000 mL | INTRAVENOUS | Status: DC

## 2022-05-26 MED ORDER — THROMBIN 5000 UNITS EX SOLR
CUTANEOUS | Status: AC
  Filled 2022-05-26: qty 5000

## 2022-05-26 MED ORDER — METHOCARBAMOL 500 MG PO TABS
500.0000 mg | ORAL_TABLET | Freq: Four times a day (QID) | ORAL | Status: DC | PRN
  Administered 2022-05-26 – 2022-05-27 (×3): 500 mg via ORAL
  Filled 2022-05-26 (×3): qty 1

## 2022-05-26 MED ORDER — FENTANYL CITRATE (PF) 250 MCG/5ML IJ SOLN
INTRAMUSCULAR | Status: DC | PRN
  Administered 2022-05-26: 50 ug via INTRAVENOUS
  Administered 2022-05-26: 25 ug via INTRAVENOUS
  Administered 2022-05-26: 75 ug via INTRAVENOUS
  Administered 2022-05-26 (×2): 50 ug via INTRAVENOUS

## 2022-05-26 MED ORDER — DEXAMETHASONE SODIUM PHOSPHATE 10 MG/ML IJ SOLN
INTRAMUSCULAR | Status: DC | PRN
  Administered 2022-05-26: 4 mg via INTRAVENOUS

## 2022-05-26 MED ORDER — PHENYLEPHRINE 80 MCG/ML (10ML) SYRINGE FOR IV PUSH (FOR BLOOD PRESSURE SUPPORT)
PREFILLED_SYRINGE | INTRAVENOUS | Status: DC | PRN
  Administered 2022-05-26 (×5): 80 ug via INTRAVENOUS

## 2022-05-26 MED ORDER — PANTOPRAZOLE SODIUM 40 MG PO TBEC
40.0000 mg | DELAYED_RELEASE_TABLET | Freq: Two times a day (BID) | ORAL | Status: DC
  Administered 2022-05-26 – 2022-05-27 (×2): 40 mg via ORAL
  Filled 2022-05-26 (×2): qty 1

## 2022-05-26 MED ORDER — HYDROCHLOROTHIAZIDE 25 MG PO TABS: 25.0000 mg | ORAL_TABLET | Freq: Every morning | ORAL | Status: DC

## 2022-05-26 MED ORDER — HYDROCODONE-ACETAMINOPHEN 5-325 MG PO TABS: 1.0000 | ORAL_TABLET | ORAL | Status: DC | PRN

## 2022-05-26 MED ORDER — POTASSIUM CHLORIDE IN NACL 20-0.9 MEQ/L-% IV SOLN: INTRAVENOUS | Status: DC

## 2022-05-26 MED ORDER — ONDANSETRON HCL 4 MG/2ML IJ SOLN
INTRAMUSCULAR | Status: DC | PRN
  Administered 2022-05-26: 4 mg via INTRAVENOUS

## 2022-05-26 MED ORDER — CHLORHEXIDINE GLUCONATE 0.12 % MT SOLN: 15.0000 mL | Freq: Once | OROMUCOSAL | Status: AC

## 2022-05-26 MED ORDER — GLUCOSE BLOOD VI STRP: 1.0000 | ORAL_STRIP | Freq: Every morning | Status: DC

## 2022-05-26 MED ORDER — DOCUSATE SODIUM 100 MG PO CAPS
100.0000 mg | ORAL_CAPSULE | Freq: Two times a day (BID) | ORAL | Status: DC
  Administered 2022-05-26: 100 mg via ORAL
  Filled 2022-05-26 (×2): qty 1

## 2022-05-26 MED ORDER — ALPRAZOLAM 0.5 MG PO TABS: 0.5000 mg | ORAL_TABLET | Freq: Two times a day (BID) | ORAL | Status: DC | PRN

## 2022-05-26 MED ORDER — PROPOFOL 10 MG/ML IV BOLUS
INTRAVENOUS | Status: DC | PRN
  Administered 2022-05-26: 150 mg via INTRAVENOUS

## 2022-05-26 MED ORDER — SODIUM CHLORIDE 0.9% FLUSH
3.0000 mL | Freq: Two times a day (BID) | INTRAVENOUS | Status: DC
  Administered 2022-05-26: 3 mL via INTRAVENOUS

## 2022-05-26 MED ORDER — FENTANYL CITRATE (PF) 100 MCG/2ML IJ SOLN
INTRAMUSCULAR | Status: AC
  Filled 2022-05-26: qty 2

## 2022-05-26 MED ORDER — METFORMIN HCL ER 500 MG PO TB24
500.0000 mg | ORAL_TABLET | Freq: Two times a day (BID) | ORAL | Status: DC
  Administered 2022-05-26 – 2022-05-27 (×2): 500 mg via ORAL
  Filled 2022-05-26 (×2): qty 1

## 2022-05-26 MED ORDER — ACETAMINOPHEN 650 MG RE SUPP: 650.0000 mg | RECTAL | Status: DC | PRN

## 2022-05-26 MED ORDER — INSULIN GLARGINE-YFGN 100 UNIT/ML ~~LOC~~ SOLN
20.0000 [IU] | Freq: Every day | SUBCUTANEOUS | Status: DC
  Filled 2022-05-26: qty 0.2

## 2022-05-26 MED ORDER — CLOPIDOGREL BISULFATE 75 MG PO TABS: 75.0000 mg | ORAL_TABLET | Freq: Every day | ORAL | Status: DC

## 2022-05-26 MED ORDER — LACTATED RINGERS IV SOLN: INTRAVENOUS | Status: DC

## 2022-05-26 MED ORDER — PHENYLEPHRINE HCL-NACL 20-0.9 MG/250ML-% IV SOLN: INTRAVENOUS | Status: DC | PRN

## 2022-05-26 MED ORDER — THROMBIN 5000 UNITS EX SOLR: OROMUCOSAL | Status: DC | PRN

## 2022-05-26 MED ORDER — BACITRACIN ZINC 500 UNIT/GM EX OINT
TOPICAL_OINTMENT | CUTANEOUS | Status: AC
  Filled 2022-05-26: qty 28.35

## 2022-05-26 MED ORDER — SUGAMMADEX SODIUM 200 MG/2ML IV SOLN
INTRAVENOUS | Status: DC | PRN
  Administered 2022-05-26: 200 mg via INTRAVENOUS

## 2022-05-26 MED ORDER — ONDANSETRON HCL 4 MG PO TABS
4.0000 mg | ORAL_TABLET | Freq: Four times a day (QID) | ORAL | Status: DC | PRN
  Administered 2022-05-26 – 2022-05-27 (×2): 4 mg via ORAL
  Filled 2022-05-26 (×2): qty 1

## 2022-05-26 MED ORDER — KETOROLAC TROMETHAMINE 15 MG/ML IJ SOLN
7.5000 mg | Freq: Four times a day (QID) | INTRAMUSCULAR | Status: DC
  Administered 2022-05-26 – 2022-05-27 (×3): 7.5 mg via INTRAVENOUS
  Filled 2022-05-26 (×3): qty 1

## 2022-05-26 MED ORDER — PROPOFOL 10 MG/ML IV BOLUS
INTRAVENOUS | Status: AC
  Filled 2022-05-26: qty 20

## 2022-05-26 MED ORDER — FENTANYL CITRATE (PF) 100 MCG/2ML IJ SOLN
25.0000 ug | INTRAMUSCULAR | Status: DC | PRN
  Administered 2022-05-26 (×2): 50 ug via INTRAVENOUS
  Administered 2022-05-26 (×2): 25 ug via INTRAVENOUS

## 2022-05-26 MED ORDER — CHLORHEXIDINE GLUCONATE CLOTH 2 % EX PADS: 6.0000 | MEDICATED_PAD | Freq: Once | CUTANEOUS | Status: DC

## 2022-05-26 MED ORDER — ATENOLOL 50 MG PO TABS
50.0000 mg | ORAL_TABLET | Freq: Every day | ORAL | Status: DC
  Administered 2022-05-26: 50 mg via ORAL
  Filled 2022-05-26: qty 1

## 2022-05-26 MED ORDER — GALANTAMINE HYDROBROMIDE ER 8 MG PO CP24
8.0000 mg | ORAL_CAPSULE | Freq: Every day | ORAL | Status: DC
  Administered 2022-05-27: 8 mg via ORAL
  Filled 2022-05-26: qty 1

## 2022-05-26 MED ORDER — FENTANYL CITRATE (PF) 250 MCG/5ML IJ SOLN
INTRAMUSCULAR | Status: AC
  Filled 2022-05-26: qty 5

## 2022-05-26 MED ORDER — ATORVASTATIN CALCIUM 80 MG PO TABS
80.0000 mg | ORAL_TABLET | Freq: Every evening | ORAL | Status: DC
  Administered 2022-05-26: 80 mg via ORAL
  Filled 2022-05-26: qty 1

## 2022-05-26 MED ORDER — BUPIVACAINE HCL (PF) 0.5 % IJ SOLN
INTRAMUSCULAR | Status: DC | PRN
  Administered 2022-05-26: 30 mL

## 2022-05-26 MED ORDER — ORAL CARE MOUTH RINSE: 15.0000 mL | Freq: Once | OROMUCOSAL | Status: AC

## 2022-05-26 MED ORDER — INSULIN ASPART 100 UNIT/ML IJ SOLN: 0.0000 [IU] | INTRAMUSCULAR | Status: DC | PRN

## 2022-05-26 MED ORDER — BUPIVACAINE HCL (PF) 0.5 % IJ SOLN
INTRAMUSCULAR | Status: AC
  Filled 2022-05-26: qty 30

## 2022-05-26 MED ORDER — LIDOCAINE 2% (20 MG/ML) 5 ML SYRINGE
INTRAMUSCULAR | Status: DC | PRN
  Administered 2022-05-26: 60 mg via INTRAVENOUS

## 2022-05-26 MED ORDER — LIDOCAINE-EPINEPHRINE 1 %-1:100000 IJ SOLN
INTRAMUSCULAR | Status: DC | PRN
  Administered 2022-05-26: 10 mL

## 2022-05-26 MED ORDER — HYDROMORPHONE HCL 1 MG/ML IJ SOLN
0.5000 mg | INTRAMUSCULAR | Status: DC | PRN
  Administered 2022-05-26: 0.5 mg via INTRAVENOUS
  Filled 2022-05-26: qty 0.5

## 2022-05-26 MED ORDER — IRBESARTAN 150 MG PO TABS
300.0000 mg | ORAL_TABLET | Freq: Every day | ORAL | Status: DC
  Administered 2022-05-26: 300 mg via ORAL
  Filled 2022-05-26: qty 2

## 2022-05-26 MED ORDER — CEFAZOLIN SODIUM-DEXTROSE 2-4 GM/100ML-% IV SOLN
2.0000 g | INTRAVENOUS | Status: AC
  Administered 2022-05-26 (×2): 2 g via INTRAVENOUS

## 2022-05-26 MED ORDER — PHENYLEPHRINE HCL-NACL 20-0.9 MG/250ML-% IV SOLN
INTRAVENOUS | Status: AC
  Filled 2022-05-26: qty 250

## 2022-05-26 MED ORDER — AMISULPRIDE (ANTIEMETIC) 5 MG/2ML IV SOLN: 10.0000 mg | Freq: Once | INTRAVENOUS | Status: DC | PRN

## 2022-05-26 MED ORDER — BUPIVACAINE LIPOSOME 1.3 % IJ SUSP
INTRAMUSCULAR | Status: AC
  Filled 2022-05-26: qty 20

## 2022-05-26 MED ORDER — AMLODIPINE BESYLATE 5 MG PO TABS: 5.0000 mg | ORAL_TABLET | Freq: Two times a day (BID) | ORAL | Status: DC

## 2022-05-26 MED ORDER — RANOLAZINE ER 500 MG PO TB12
500.0000 mg | ORAL_TABLET | Freq: Two times a day (BID) | ORAL | Status: DC
  Administered 2022-05-26: 500 mg via ORAL
  Filled 2022-05-26 (×3): qty 1

## 2022-05-26 MED ORDER — POLYETHYLENE GLYCOL 3350 17 G PO PACK
17.0000 g | PACK | Freq: Every day | ORAL | Status: DC
  Filled 2022-05-26: qty 1

## 2022-05-26 MED ORDER — PHENYLEPHRINE HCL-NACL 20-0.9 MG/250ML-% IV SOLN
INTRAVENOUS | Status: DC | PRN
  Administered 2022-05-26: 25 ug/min via INTRAVENOUS

## 2022-05-26 MED ORDER — ONDANSETRON HCL 4 MG/2ML IJ SOLN: 4.0000 mg | Freq: Once | INTRAMUSCULAR | Status: DC | PRN

## 2022-05-26 MED ORDER — NITROGLYCERIN 0.4 MG SL SUBL: 0.4000 mg | SUBLINGUAL_TABLET | SUBLINGUAL | Status: DC | PRN

## 2022-05-26 MED ORDER — BUPIVACAINE LIPOSOME 1.3 % IJ SUSP
INTRAMUSCULAR | Status: DC | PRN
  Administered 2022-05-26: 20 mL

## 2022-05-26 MED ORDER — 0.9 % SODIUM CHLORIDE (POUR BTL) OPTIME
TOPICAL | Status: DC | PRN
  Administered 2022-05-26: 1000 mL

## 2022-05-26 MED ORDER — INSULIN ASPART 100 UNIT/ML IJ SOLN
0.0000 [IU] | Freq: Three times a day (TID) | INTRAMUSCULAR | Status: DC
  Administered 2022-05-27: 8 [IU] via SUBCUTANEOUS

## 2022-05-26 MED ORDER — ACETAMINOPHEN 500 MG PO TABS
ORAL_TABLET | ORAL | Status: AC
  Administered 2022-05-26: 1000 mg via ORAL
  Filled 2022-05-26: qty 2

## 2022-05-26 MED ORDER — FAMOTIDINE 20 MG PO TABS
40.0000 mg | ORAL_TABLET | Freq: Two times a day (BID) | ORAL | Status: DC
  Administered 2022-05-26 – 2022-05-27 (×2): 40 mg via ORAL
  Filled 2022-05-26 (×2): qty 2

## 2022-05-26 MED ORDER — ONDANSETRON HCL 4 MG/2ML IJ SOLN: 4.0000 mg | Freq: Four times a day (QID) | INTRAMUSCULAR | Status: DC | PRN

## 2022-05-26 MED ORDER — ROCURONIUM BROMIDE 10 MG/ML (PF) SYRINGE
PREFILLED_SYRINGE | INTRAVENOUS | Status: DC | PRN
  Administered 2022-05-26: 20 mg via INTRAVENOUS
  Administered 2022-05-26: 10 mg via INTRAVENOUS
  Administered 2022-05-26: 60 mg via INTRAVENOUS
  Administered 2022-05-26: 20 mg via INTRAVENOUS

## 2022-05-26 MED ORDER — INSULIN ASPART 100 UNIT/ML IJ SOLN
0.0000 [IU] | Freq: Every day | INTRAMUSCULAR | Status: DC
  Administered 2022-05-26: 3 [IU] via SUBCUTANEOUS

## 2022-05-26 MED ORDER — ASPIRIN 81 MG PO TBEC: 81.0000 mg | DELAYED_RELEASE_TABLET | Freq: Every day | ORAL | Status: DC

## 2022-05-26 MED ORDER — PHENOL 1.4 % MT LIQD: 1.0000 | OROMUCOSAL | Status: DC | PRN

## 2022-05-26 MED ORDER — ONDANSETRON HCL 4 MG/2ML IJ SOLN
INTRAMUSCULAR | Status: AC
  Filled 2022-05-26: qty 2

## 2022-05-26 MED ORDER — HYDROCODONE-ACETAMINOPHEN 5-325 MG PO TABS
2.0000 | ORAL_TABLET | ORAL | Status: DC | PRN
  Administered 2022-05-26 – 2022-05-27 (×5): 2 via ORAL
  Filled 2022-05-26 (×5): qty 2

## 2022-05-26 MED ORDER — SODIUM CHLORIDE 0.9% FLUSH: 3.0000 mL | INTRAVENOUS | Status: DC | PRN

## 2022-05-26 MED ORDER — INSULIN DEGLUDEC 100 UNIT/ML ~~LOC~~ SOPN: 20.0000 [IU] | PEN_INJECTOR | Freq: Every day | SUBCUTANEOUS | Status: DC

## 2022-05-26 MED ORDER — CEFAZOLIN SODIUM-DEXTROSE 2-4 GM/100ML-% IV SOLN
2.0000 g | Freq: Three times a day (TID) | INTRAVENOUS | Status: AC
  Administered 2022-05-26 – 2022-05-27 (×2): 2 g via INTRAVENOUS
  Filled 2022-05-26 (×2): qty 100

## 2022-05-26 MED ORDER — LIDOCAINE-EPINEPHRINE 1 %-1:100000 IJ SOLN
INTRAMUSCULAR | Status: AC
  Filled 2022-05-26: qty 1

## 2022-05-26 MED ORDER — ACETAMINOPHEN 325 MG PO TABS: 650.0000 mg | ORAL_TABLET | ORAL | Status: DC | PRN

## 2022-05-26 MED ORDER — FLEET ENEMA 7-19 GM/118ML RE ENEM: 1.0000 | ENEMA | Freq: Once | RECTAL | Status: DC | PRN

## 2022-05-26 MED ORDER — ACETAMINOPHEN 500 MG PO TABS: 1000.0000 mg | ORAL_TABLET | Freq: Once | ORAL | Status: AC

## 2022-05-26 MED ORDER — EMPAGLIFLOZIN 25 MG PO TABS
25.0000 mg | ORAL_TABLET | Freq: Every day | ORAL | Status: DC
  Administered 2022-05-27: 25 mg via ORAL
  Filled 2022-05-26: qty 1

## 2022-05-26 MED ORDER — BACITRACIN 500 UNIT/GM EX OINT
TOPICAL_OINTMENT | CUTANEOUS | Status: DC | PRN
  Administered 2022-05-26: 1

## 2022-05-26 MED ORDER — CHLORHEXIDINE GLUCONATE 0.12 % MT SOLN
OROMUCOSAL | Status: AC
  Administered 2022-05-26: 15 mL via OROMUCOSAL
  Filled 2022-05-26: qty 15

## 2022-05-26 SURGICAL SUPPLY — 89 items
ADH SKN CLS APL DERMABOND .7 (GAUZE/BANDAGES/DRESSINGS) ×2
BAG COUNTER SPONGE SURGICOUNT (BAG) ×2 IMPLANT
BAG SPNG CNTER NS LX DISP (BAG) ×1
BAND INSRT 18 STRL LF DISP RB (MISCELLANEOUS)
BAND RUBBER #18 3X1/16 STRL (MISCELLANEOUS) IMPLANT
BASKET BONE COLLECTION (BASKET) ×2 IMPLANT
BLADE BONE MILL MEDIUM (MISCELLANEOUS) IMPLANT
BLADE CLIPPER SURG (BLADE) IMPLANT
BUR 14 MATCH 3 (BUR) IMPLANT
BUR MATCHSTICK NEURO 3.0 LAGG (BURR) ×2 IMPLANT
BUR MR8 14 BALL 5 (BUR) IMPLANT
BUR PRECISION FLUTE 5.0 (BURR) ×2 IMPLANT
BURR 14 MATCH 3 (BUR) ×1
BURR MR8 14 BALL 5 (BUR)
CANISTER SUCT 3000ML PPV (MISCELLANEOUS) ×2 IMPLANT
CNTNR URN SCR LID CUP LEK RST (MISCELLANEOUS) ×2 IMPLANT
CONT SPEC 4OZ STRL OR WHT (MISCELLANEOUS) ×1
COVER BACK TABLE 60X90IN (DRAPES) ×2 IMPLANT
COVERAGE SUPPORT O-ARM STEALTH (MISCELLANEOUS) ×1 IMPLANT
DERMABOND ADVANCED .7 DNX12 (GAUZE/BANDAGES/DRESSINGS) ×4 IMPLANT
DRAIN JACKSON PRATT 10MM FLAT (MISCELLANEOUS) IMPLANT
DRAPE 3/4 80X56 (DRAPES) ×2 IMPLANT
DRAPE C-ARM 42X72 X-RAY (DRAPES) ×2 IMPLANT
DRAPE C-ARMOR (DRAPES) ×2 IMPLANT
DRAPE LAPAROTOMY 100X72X124 (DRAPES) ×2 IMPLANT
DRAPE MICROSCOPE SLANT 54X150 (MISCELLANEOUS) IMPLANT
DRAPE SHEET LG 3/4 BI-LAMINATE (DRAPES) ×8 IMPLANT
DRSG OPSITE POSTOP 4X6 (GAUZE/BANDAGES/DRESSINGS) IMPLANT
DRSG OPSITE POSTOP 4X8 (GAUZE/BANDAGES/DRESSINGS) IMPLANT
DURAPREP 26ML APPLICATOR (WOUND CARE) ×2 IMPLANT
ELECT BLADE INSULATED 6.5IN (ELECTROSURGICAL) ×1
ELECT REM PT RETURN 9FT ADLT (ELECTROSURGICAL) ×1
ELECTRODE BLDE INSULATED 6.5IN (ELECTROSURGICAL) ×2 IMPLANT
ELECTRODE REM PT RTRN 9FT ADLT (ELECTROSURGICAL) ×2 IMPLANT
EVACUATOR SILICONE 100CC (DRAIN) IMPLANT
FEE COVERAGE SUPPORT O-ARM (MISCELLANEOUS) ×2 IMPLANT
GAUZE 4X4 16PLY ~~LOC~~+RFID DBL (SPONGE) IMPLANT
GAUZE SPONGE 4X4 12PLY STRL (GAUZE/BANDAGES/DRESSINGS) IMPLANT
GLOVE BIO SURGEON STRL SZ7 (GLOVE) ×8 IMPLANT
GLOVE BIOGEL PI IND STRL 7.0 (GLOVE) ×4 IMPLANT
GLOVE BIOGEL PI IND STRL 7.5 (GLOVE) ×6 IMPLANT
GLOVE ECLIPSE 7.5 STRL STRAW (GLOVE) ×4 IMPLANT
GLOVE EXAM NITRILE XL STR (GLOVE) IMPLANT
GOWN STRL REUS W/ TWL LRG LVL3 (GOWN DISPOSABLE) ×2 IMPLANT
GOWN STRL REUS W/ TWL XL LVL3 (GOWN DISPOSABLE) ×4 IMPLANT
GOWN STRL REUS W/TWL 2XL LVL3 (GOWN DISPOSABLE) IMPLANT
GOWN STRL REUS W/TWL LRG LVL3 (GOWN DISPOSABLE) ×1
GOWN STRL REUS W/TWL XL LVL3 (GOWN DISPOSABLE) ×2
GRAFT BONE PROTEIOS XS 0.5CC (Orthopedic Implant) IMPLANT
HEMOSTAT POWDER KIT SURGIFOAM (HEMOSTASIS) ×2 IMPLANT
KIT BASIN OR (CUSTOM PROCEDURE TRAY) ×2 IMPLANT
KIT TURNOVER KIT B (KITS) ×2 IMPLANT
MARKER SPHERE PSV REFLC NDI (MISCELLANEOUS) ×10 IMPLANT
MILL BONE PREP (MISCELLANEOUS) ×2 IMPLANT
NDL HYPO 18GX1.5 BLUNT FILL (NEEDLE) IMPLANT
NDL HYPO 21X1.5 SAFETY (NEEDLE) ×2 IMPLANT
NDL SPNL 18GX3.5 QUINCKE PK (NEEDLE) IMPLANT
NEEDLE HYPO 18GX1.5 BLUNT FILL (NEEDLE) IMPLANT
NEEDLE HYPO 21X1.5 SAFETY (NEEDLE) ×1 IMPLANT
NEEDLE HYPO 22GX1.5 SAFETY (NEEDLE) ×2 IMPLANT
NEEDLE SPNL 18GX3.5 QUINCKE PK (NEEDLE) IMPLANT
NS IRRIG 1000ML POUR BTL (IV SOLUTION) ×2 IMPLANT
PACK LAMINECTOMY NEURO (CUSTOM PROCEDURE TRAY) ×2 IMPLANT
PAD ARMBOARD 7.5X6 YLW CONV (MISCELLANEOUS) ×6 IMPLANT
PIN BONE FIX 100 (PIN) IMPLANT
PUTTY GRAFTON DBF 6CC W/DELIVE (Putty) IMPLANT
ROD 40MM SOLERA PREBENT (Rod) IMPLANT
ROD SPINAL 5.5X35 CP 4 TI (Rod) IMPLANT
SCREW MAS/SET STERILE 4PK (Screw) IMPLANT
SCREW NANOLOCK HORIZ 7.5X50 (Screw) IMPLANT
SCREW SHANK IFIX DT 8.5X40 (Screw) IMPLANT
SCREW SHANK NL TITAN 7.5X45 (Screw) IMPLANT
SOL ELECTROSURG ANTI STICK (MISCELLANEOUS) ×1
SOLUTION ELECTROSURG ANTI STCK (MISCELLANEOUS) ×4 IMPLANT
SPACER TLIF CATALYFT 9X40 (Spacer) IMPLANT
SPIKE FLUID TRANSFER (MISCELLANEOUS) ×2 IMPLANT
SPONGE SURGIFOAM ABS GEL 100 (HEMOSTASIS) IMPLANT
SPONGE T-LAP 4X18 ~~LOC~~+RFID (SPONGE) IMPLANT
STAPLER VISISTAT 35W (STAPLE) ×2 IMPLANT
SUT MNCRL AB 4-0 PS2 18 (SUTURE) ×2 IMPLANT
SUT VIC AB 0 CT1 18XCR BRD8 (SUTURE) ×2 IMPLANT
SUT VIC AB 0 CT1 8-18 (SUTURE) ×2
SUT VIC AB 2-0 CP2 18 (SUTURE) ×2 IMPLANT
SUT VIC AB 2-0 CT1 18 (SUTURE) IMPLANT
SYR 30ML LL (SYRINGE) ×2 IMPLANT
TOWEL GREEN STERILE (TOWEL DISPOSABLE) ×2 IMPLANT
TOWEL GREEN STERILE FF (TOWEL DISPOSABLE) ×2 IMPLANT
TRAY FOLEY MTR SLVR 16FR STAT (SET/KITS/TRAYS/PACK) ×2 IMPLANT
WATER STERILE IRR 1000ML POUR (IV SOLUTION) ×2 IMPLANT

## 2022-05-26 NOTE — Anesthesia Procedure Notes (Signed)
Procedure Name: Intubation Date/Time: 05/26/2022 8:17 AM  Performed by: Rachel Moulds, CRNAPre-anesthesia Checklist: Patient identified, Emergency Drugs available, Suction available, Patient being monitored and Timeout performed Patient Re-evaluated:Patient Re-evaluated prior to induction Oxygen Delivery Method: Circle system utilized Preoxygenation: Pre-oxygenation with 100% oxygen Induction Type: IV induction Ventilation: Mask ventilation without difficulty Laryngoscope Size: Mac and 4 Grade View: Grade II Tube type: Oral Tube size: 7.5 mm Number of attempts: 1 Placement Confirmation: ETT inserted through vocal cords under direct vision, positive ETCO2, CO2 detector and breath sounds checked- equal and bilateral Secured at: 23 cm Tube secured with: Tape Dental Injury: Teeth and Oropharynx as per pre-operative assessment

## 2022-05-26 NOTE — Anesthesia Postprocedure Evaluation (Signed)
Anesthesia Post Note  Patient: Hunter Mcbride.  Procedure(s) Performed: Posterior Lumbar Interbody Fusion Lumbar Four-Lumbar Five; Explore  Fusion Lumbar Five-Sacral One Application of O-Arm     Patient location during evaluation: PACU Anesthesia Type: General Level of consciousness: awake Pain management: pain level controlled Vital Signs Assessment: post-procedure vital signs reviewed and stable Respiratory status: spontaneous breathing, nonlabored ventilation and respiratory function stable Cardiovascular status: blood pressure returned to baseline and stable Postop Assessment: no apparent nausea or vomiting Anesthetic complications: no   No notable events documented.  Last Vitals:  Vitals:   05/26/22 1415 05/26/22 1503  BP: 117/69 128/72  Pulse: 60 63  Resp: 13 18  Temp: 36.8 C   SpO2: 94% 97%    Last Pain:  Vitals:   05/26/22 1750  PainSc: 8                  Sherel Fennell P Bentlee Drier

## 2022-05-26 NOTE — H&P (Signed)
CC: back and left >right leg pain  HPI:     Patient is a 77 y.o. male presents with hx of posterior lumbar decompression, subsequent fusion at L5-S1 complicated by infection who developed progressive back pain and left leg radiculopathy refractory to PT, medical therapies.  More recently, he has noticed symptoms on his right side.    Patient Active Problem List   Diagnosis Date Noted   MCI (mild cognitive impairment) with memory loss 11/29/2021   Abnormal PET scan of head 11/29/2021   Tremor of both hands 05/20/2021   Tremor of tongue 05/20/2021   Poor short term memory 05/20/2021   Snoring 05/20/2021   Nightmares REM-sleep type 05/20/2021   Cellulitis of finger of right hand 04/21/2021   Chronic cough 01/06/2021   Other specified diseases of blood and blood-forming organs 01/06/2021   Hoarseness 12/30/2020   Laryngopharyngeal reflux 12/30/2020   Strain of muscle, fascia and tendon of lower back, initial encounter 09/21/2020   Spasm of back muscles 09/21/2020   Flatulence, eructation and gas pain 03/06/2020   S/P cardiac cath stable exam of coronary arteries 07/26/2019   Wound infection 09/04/2018   S/P lumbar fusion 07/26/2018   Pain in limb 07/06/2018   Left foot drop 07/06/2018   Disorder of the skin and subcutaneous tissue, unspecified 04/27/2018   Degeneration of lumbar intervertebral disc 07/19/2017   Lumbar radiculopathy 07/19/2017   Iron deficiency 07/13/2017   History of total hip replacement, right 02/23/2017   Coronary artery disease 11/07/2016   Angina pectoris 10/29/2016   GERD (gastroesophageal reflux disease) 10/28/2016   Coronary artery disease due to calcified coronary lesion    Hyperlipidemia LDL goal <70    Overweight (BMI 25.0-29.9) 04/13/2016   S/P right THA, AA 04/12/2016   Right hip pain 11/06/2015   Dysphagia 09/08/2015   Injury 07/27/2015   Benign neoplasm of colon 04/23/2015   Encounter for general adult medical examination without abnormal  findings 04/16/2015   Acute bronchitis 03/06/2015   Angina at rest 02/25/2015   Type 2 diabetes mellitus treated without insulin    Coronary atherosclerosis of native coronary artery 06/27/2013   Unstable angina 06/21/2013   Chest pain 06/20/2013   Testicular hypofunction 05/28/2013   Fatigue 05/27/2013   Cellulitis 10/17/2012   Shoulder arthritis 03/02/2012   Localized, primary osteoarthritis of shoulder region 03/02/2012   Hearing loss 12/01/2011   Olecranon bursitis, right elbow 10/19/2011   Reduced libido 04/15/2011   Shoulder joint pain 04/15/2011   Hypertension 04/11/2011   Hyperlipidemia 04/11/2011   Diabetes mellitus 04/11/2011   Chest tightness 10/13/2010   Osteoarthritis 01/26/2009   Anxiety disorder 01/12/2009   Asthma without status asthmaticus 01/12/2009   Carpal tunnel syndrome 01/12/2009   Intervertebral disc disorder 01/12/2009   Male erectile disorder 01/12/2009   Sleep apnea 01/12/2009   Past Medical History:  Diagnosis Date   Arthritis    "lower back; right shoulder; left thumb; joints" (06/21/2013)   Asthma    "not sure if this is true or not" (06/21/2013)   CAD (coronary artery disease) 06/2013   s/p PTCA/DES x 3 to mid LAD 2015. b. s/p CABG in 2018. c. cath 12/2017 -> med rx.   Dementia 2023   per pt's wife   GERD (gastroesophageal reflux disease)    Gout    "maybe twice in my life"   Headache(784.0)    "weekly for the last 3-4 months" (06/21/2013)   Hyperlipidemia    Hypertension    Myocardial infarction    "/  Dr. Alan MulderNasser I've had one between 2014-2015) (06/21/2013)   Neuromuscular disorder    Osteoarthritis    Skin cancer; face 2023   Sleep apnea    WEIGHT LOSS, NO LONGER NEEDS PER PATIENT   Type II diabetes mellitus    TYPE 2    Past Surgical History:  Procedure Laterality Date   CARDIAC CATHETERIZATION  1980's   "once"   CARDIAC CATHETERIZATION N/A 07/23/2015   Procedure: Left Heart Cath and Coronary Angiography;  Surgeon: Tonny BollmanMichael Cooper,  MD;  [facility];  Service: Cardiovascular;  Laterality: N/A;   CARDIOVASCULAR STRESS TEST  06/10/2008   EF 68%   CARPAL TUNNEL RELEASE Bilateral    COLONOSCOPY     CORONARY ANGIOPLASTY WITH STENT PLACEMENT  06/21/2013   "3"   CORONARY ARTERY BYPASS GRAFT N/A 11/07/2016   Procedure: CORONARY ARTERY BYPASS GRAFTING (CABG) x 5 (LIMA to DISTAL LAD, SVG to DIAGONAL, SVG to CIRCUMFLEX, and SVG SEQUENTIALLY to PLB and DISTAL PDA) with EVH of the RIGHT GREATER SAPHENOUS VEIN and LEFT INTERNAL MAMMARY ARTERY HARVEST;  Surgeon: Delight OvensGerhardt, Edward B, MD;  Location: [facility] OR;  Service: Open Heart Surgery;  Laterality: N/A;   CORONARY PRESSURE/FFR STUDY N/A 10/28/2016   Procedure: INTRAVASCULAR PRESSURE WIRE/FFR STUDY;  Surgeon: Marykay LexHarding, David W, MD;  [facility];  Service: Cardiovascular;  Laterality: N/A;   CORONARY STENT INTERVENTION N/A 06/07/2016   Procedure: Coronary Stent Intervention;  Surgeon: Kathleene Hazelhristopher D McAlhany, MD;  Location: [facility];  Service: Cardiovascular;  Laterality: N/A;   EXCISION MORTON'S NEUROMA Left    EYE SURGERY Left    "removed film over my eye"   FRACTIONAL FLOW RESERVE WIRE N/A 06/26/2013   Procedure: FRACTIONAL FLOW RESERVE WIRE;  Surgeon: Iran OuchMuhammad A Arida, MD;  Location: [facility] CATH LAB;  Service: Cardiovascular;  Laterality: N/A;   HERNIA REPAIR Left    JOINT REPLACEMENT Left 03/2013   "thumb"   LEFT HEART CATH AND CORONARY ANGIOGRAPHY N/A 06/07/2016   Procedure: Left Heart Cath and Coronary Angiography;  Surgeon: Kathleene Hazelhristopher D McAlhany, MD;  [facility];  Service: Cardiovascular;  Laterality: N/A;   LEFT HEART CATH AND CORONARY ANGIOGRAPHY N/A 10/28/2016   Procedure: LEFT HEART CATH AND CORONARY ANGIOGRAPHY;  Surgeon: Marykay LexHarding, David W, MD;  [facility];  Service: Cardiovascular;  Laterality: N/A;   LEFT HEART CATH AND CORS/GRAFTS ANGIOGRAPHY N/A 12/25/2017   Procedure: LEFT HEART CATH AND  CORS/GRAFTS ANGIOGRAPHY;  Surgeon: Tonny Bollmanooper, Michael, MD;  [facility];  Service: Cardiovascular;  Laterality: N/A;   LEFT HEART CATH AND CORS/GRAFTS ANGIOGRAPHY N/A 07/26/2019   Procedure: LEFT HEART CATH AND CORS/GRAFTS ANGIOGRAPHY;  Surgeon: Iran OuchArida, Muhammad A, MD;  [facility];  Service: Cardiovascular;  Laterality: N/A;   LEFT HEART CATHETERIZATION WITH CORONARY ANGIOGRAM N/A 06/21/2013   Procedure: LEFT HEART CATHETERIZATION WITH CORONARY ANGIOGRAM;  Surgeon: Kathleene Hazelhristopher D McAlhany, MD;  [facility];  Service: Cardiovascular;  Laterality: N/A;   LEFT HEART CATHETERIZATION WITH CORONARY ANGIOGRAM N/A 06/26/2013   Procedure: LEFT HEART CATHETERIZATION WITH CORONARY ANGIOGRAM;  Surgeon: Iran OuchMuhammad A Arida, MD;  Location: [facility];  Service: Cardiovascular;  Laterality: N/A;   LUMBAR LAMINECTOMY/DECOMPRESSION MICRODISCECTOMY Left 05/30/2018   Procedure: Left  Gill L5 decompression;  Surgeon: Venita LickBrooks, Dahari, MD;  [facility];  Service: Orthopedics;  Laterality: Left;  150min   POLYPECTOMY     TEE WITHOUT CARDIOVERSION N/A 11/07/2016   Procedure:  TRANSESOPHAGEAL ECHOCARDIOGRAM (TEE);  Surgeon: Delight Ovens, MD;  Location[facility] OR;  Service: Open Heart Surgery;  Laterality: N/A;   TONSILLECTOMY     TOTAL HIP ARTHROPLASTY Right 04/12/2016   Procedure: RIGHT TOTAL HIP ARTHROPLASTY ANTERIOR APPROACH;  Surgeon: Durene Romans, MD;  Location: [facility] ORS;  Service: Orthopedics;  Laterality: Right;  requests 70 mins   TOTAL SHOULDER ARTHROPLASTY  03/01/2012   Procedure: TOTAL SHOULDER ARTHROPLASTY;  Surgeon: Senaida Lange, MD;  Location: [facility] OR;  Service: Orthopedics;  Laterality: Right;   UMBILICAL HERNIA REPAIR      Medications Prior to Admission  Medication Sig Dispense Refill Last Dose   acetaminophen (TYLENOL) 325 MG tablet Take 650 mg by mouth every 6 (six) hours as needed for moderate pain.   05/25/2022   ALPRAZolam (XANAX) 0.5 MG tablet Take 0.5 mg by  mouth 2 (two) times daily as needed for anxiety or sleep.   05/25/2022   amLODipine (NORVASC) 5 MG tablet TAKE ONE TABLET BY MOUTH TWICE DAILY 180 tablet 3 05/26/2022 at 0430   aspirin EC 81 MG tablet Take 81 mg by mouth at bedtime.    05/20/2022   atenolol (TENORMIN) 50 MG tablet TAKE ONE TABLET BY MOUTH ONE TIME DAILY at suppertime   05/25/2022   atorvastatin (LIPITOR) 80 MG tablet Take 80 mg by mouth every evening.   05/25/2022   calcium carbonate (TUMS - DOSED IN MG ELEMENTAL CALCIUM) 500 MG chewable tablet Chew 2 tablets by mouth as needed for indigestion or heartburn.    Past Month   ciclopirox (LOPROX) 0.77 % cream Apply 1 Application topically daily as needed (irritation).   Past Week   clopidogrel (PLAVIX) 75 MG tablet TAKE ONE TABLET BY MOUTH DAILY WITH BREAKFAST 90 tablet 2 05/20/2022   docusate sodium (COLACE) 100 MG capsule Take 100 mg by mouth 2 (two) times daily.   05/25/2022   empagliflozin (JARDIANCE) 25 MG TABS tablet Take 25 mg by mouth daily.   05/22/2022   famotidine (PEPCID) 40 MG tablet Take 1 tablet (40 mg total) by mouth 2 (two) times daily. 60 tablet 5 05/26/2022 at 0430   gabapentin (NEURONTIN) 300 MG capsule Take 300 mg by mouth at bedtime.   05/25/2022   galantamine (RAZADYNE ER) 8 MG 24 hr capsule Take 8 mg by mouth daily with breakfast.   05/26/2022 at 0430   hydrochlorothiazide (HYDRODIURIL) 25 MG tablet Take 25 mg by mouth every morning.   05/25/2022   irbesartan (AVAPRO) 300 MG tablet Take 300 mg by mouth at bedtime.    05/25/2022   isosorbide mononitrate (IMDUR) 30 MG 24 hr tablet Take 1 tablet by mouth once daily (Patient taking differently: Take 30 mg by mouth at bedtime.) 90 tablet 3 05/25/2022   metFORMIN (GLUCOPHAGE-XR) 500 MG 24 hr tablet Take 1 tablet (500 mg total) by mouth 2 (two) times daily.   05/25/2022   nitroGLYCERIN (NITROSTAT) 0.4 MG SL tablet DISSOLVE 1 TABLET UNDER TONGUE AS NEEDEDFOR CHEST PAIN. MAY REPEAT 5 MINUTES APART 3 TIMES IF NEEDED 25 tablet 11     pantoprazole (PROTONIX) 40 MG tablet Take 1 tablet by mouth 2 (two) times daily.   05/26/2022 at 0430   polyethylene glycol (MIRALAX / GLYCOLAX) 17 g packet Take 17 g by mouth daily.   05/25/2022   ranolazine (RANEXA) 500 MG 12 hr tablet TAKE ONE TABLET BY MOUTH TWICE DAILY 180 tablet 3 05/26/2022 at 0430   TRESIBA FLEXTOUCH 100 UNIT/ML FlexTouch Pen Inject 20  Units into the skin daily.   05/26/2022   Vitamin D, Cholecalciferol, 25 MCG (1000 UT) CAPS Take 1 capsule by mouth daily.   Past Week   FREESTYLE TEST STRIPS test strip 1 each by Other route every morning.       mupirocin ointment (BACTROBAN) 2 % Apply 1 Application topically 3 (three) times daily as needed (wound care).   Unknown   Naftifine HCl 2 % CREA Apply 1 Application topically 2 (two) times daily as needed (yeast).   Unknown   promethazine (PHENERGAN) 12.5 MG tablet Take 12.5 mg by mouth every 8 (eight) hours as needed for nausea or vomiting.   More than a month   triamcinolone cream (KENALOG) 0.1 % Apply 1 application  topically 2 (two) times daily as needed (irritation).   Unknown   Allergies  Allergen Reactions   Hydrocodone Nausea Only    Other reaction(s): nauseated    Methocarbamol Nausea Only    Other reaction(s): nauseated   Sertraline Hcl     Other Reaction(s): zombie   Oxycodone Nausea Only    Social History   Tobacco Use   Smoking status: Never    Passive exposure: Never   Smokeless tobacco: Never  Substance Use Topics   Alcohol use: No    Family History  Problem Relation Age of Onset   Colon polyps Mother    Diabetes type II Mother    Heart disease Father    Heart attack Father    Heart disease Brother    Coronary artery disease Cousin    Colon cancer Neg Hx    Pancreatic cancer Neg Hx    Esophageal cancer Neg Hx    Stomach cancer Neg Hx    Liver disease Neg Hx    Rectal cancer Neg Hx      Review of Systems Pertinent items are noted in HPI.  Objective:   Patient Vitals for the past 8 hrs:   BP Temp Pulse Resp SpO2 Height Weight  05/26/22 0608 134/77 98.3 F (36.8 C) (!) 56 18 96 % 5\' 9"  (1.753 m) 89.9 kg   No intake/output data recorded. No intake/output data recorded.      General : Alert, cooperative, no distress, appears stated age   Head:  Normocephalic/atraumatic    Eyes: PERRL, conjunctiva/corneas clear, EOM's intact. Fundi could not be visualized Neck: Supple Chest:  Respirations unlabored Chest wall: no tenderness or deformity Heart: Regular rate and rhythm Abdomen: Soft, nontender and nondistended Extremities: warm and well-perfused Skin: normal turgor, color and texture Neurologic:  Alert, oriented x 3.  Eyes open spontaneously. PERRL, EOMI, VFC, no facial droop. V1-3 intact.  No dysarthria, tongue protrusion symmetric.  CNII-XII intact. Normal strength, sensation and reflexes throughout.  No pronator drift, full strength in legs. + SLR on left.  Well healed incision       Data ReviewCBC:  Lab Results  Component Value Date   WBC 3.4 (L) 05/20/2022   RBC 4.93 05/20/2022   BMP:  Lab Results  Component Value Date   GLUCOSE 247 (H) 05/20/2022   CO2 27 05/20/2022   BUN 22 05/20/2022   BUN 26 09/27/2021   CREATININE 1.07 05/20/2022   CREATININE 0.92 07/22/2015   CALCIUM 9.4 05/20/2022   Radiology review:  See clinic note for details  Assessment:   Active Problems:   * No active hospital problems. *  L4-5 adjacent segment disease Plan:   - plan on L4-5 PLIF, exploration of previous fusion

## 2022-05-26 NOTE — Transfer of Care (Signed)
Immediate Anesthesia Transfer of Care Note  Patient: Hunter Mcbride.  Procedure(s) Performed: Posterior Lumbar Interbody Fusion Lumbar Four-Lumbar Five; Explore  Fusion Lumbar Five-Sacral One Application of O-Arm  Patient Location: PACU  Anesthesia Type:General  Level of Consciousness: awake, alert , and oriented  Airway & Oxygen Therapy: Patient Spontanous Breathing and Patient connected to face mask oxygen  Post-op Assessment: Report given to RN, Post -op Vital signs reviewed and stable, and Patient moving all extremities X 4  Post vital signs: Reviewed and stable  Last Vitals:  Vitals Value Taken Time  BP 139/81 05/26/22 1251  Temp    Pulse 69 05/26/22 1253  Resp 19 05/26/22 1253  SpO2 96 % 05/26/22 1253  Vitals shown include unvalidated device data.  Last Pain:  Vitals:   05/26/22 0610  PainSc: 5          Complications: No notable events documented.

## 2022-05-26 NOTE — Progress Notes (Signed)
Orthopedic Tech Progress Note Patient Details:  Hunter Mcbride 01/16/1946 [id number]  Ortho Devices Type of Ortho Device: Lumbar corsett Ortho Device/Splint Location: at 3C nurse's station Ortho Device/Splint Interventions: Ordered   Post Interventions Instructions Provided: Care of device  Docia Furl 05/26/2022, 3:55 PM

## 2022-05-26 NOTE — Op Note (Signed)
PREOP DIAGNOSIS: L4-5 adjacent segment disease and lumbar stenosis   POSTOP DIAGNOSIS: L4-5 adjacent segment disease and lumbar stenosis   PROCEDURE: 1.  L4-5  lumbar interbody fusion via left transforaminal approach, including laminectomy, bilateral facetectomies and foraminotomies 2.  L4-5 posterolateral arthrodesis 3. Placement of interbody cages L4-5 4. Exploration of previous fusion and revision of instrumentation, nonsegmental instrumentation with pedicle screw and rod construct at L4-5 5. Harvest of local autograft 6. Use of morselized allograft 7. Computer-assisted Stealth navigation with O-arm intra-op CT    SURGEON: Dr. Hoyt Koch, MD   ASSISTANT:  Patrici Ranks, PA, who assisted with exposure, closure, and nerve root retraction for safe interbody placement.    ANESTHESIA: General Endotracheal   EBL: 500 mL   IMPLANTS:  [employer] L4-5: 9 mm PL40 Catalyft cage, Kinkead 7.5 x 45/50 mm screws at L4, 8.5 x 40 mm screws at L5 40 mm/35 mm rods ProteiOs DBF   SPECIMENS: None   DRAINS: 10 flat JP   COMPLICATIONS: None   CONDITION: Stable to PACU   HISTORY: This is a 77 y.o. male presents with hx of posterior lumbar decompression, subsequent fusion at L5-S1 complicated by infection who developed progressive back pain and left leg radiculopathy refractory to PT, medical therapies.  More recently, he has noticed symptoms on his right side.    Imaging showed severe stenosis L4-5 with adjacent segment disease.  Physical therapy and numerous injections failed to improve his pain.  Treatment options were discussed and the patient elected to proceed with posterior lumbar interbody fusion at L4-5. risks, benefits, alternatives, and expected convalescence were discussed with the patient.  Risks discussed included but were not limited to bleeding, pain, infection, scar, pseudoarthrosis, CSF leak, neurologic deficit, paralysis, and death.  The patient wished to proceed with surgery  and informed consent was obtained.   PROCEDURE IN DETAIL: After informed consent was obtained and witnessed, the patient was brought to the operating room. After induction of general anesthesia, the patient was positioned on the operative table in the prone position on a Jackson table in neutral position with all pressure points meticulously padded. The skin of the low back was then prepped and draped in the usual sterile fashion.  1% lidocaine with epinephrine was injected in the skin and a timeout was performed.  Preoperative antibiotics were administered.  Previous incision was opened with a 10 blade.  Subcutaneous tissue and the fascia was incised with monopolar electrocautery.  The paraspinous muscles and scar were dissected off the lamina in subperiosteal fashion.  Additional dissection continued out laterally, exposing the transverse processes of L4, and L5 bilaterally which were then decorticated.  Previous instrumentation was dissected out.  Screw caps and rods were removed.  As his prior instrumentation was a proprietary system not compatible with standard instrumentation, bilateral L5 and S1 screws were removed by introducing a rod in tulip heads, securing with screw cap and unscrewing it.  Previous L5-S1 fusion was explored and there was solid bony fusion across right L5-S1 facet.  Spinous process clamp was placed at S1 and intraoperative O-arm CT was obtained, allowing for computer assisted navigation.  Using Stealth navigation, pilot holes were drilled with navigated drill for L4 pedicle screws.  Navigated tap and finally navigated shanks were placed with good purchase.  Appropriately sized navigated shanks were then placed at L5 bilaterally with good purchase.  Ball ended feeler confirmed good cannulation of pedicle prior to shank placement.   The interspinous ligament at L4-5  was then  removed and laminectomy of the bottom two thirds of the lamina as well as the superior third of the inferior   lamina was performed with high-speed drill.  Medial right facetectomy and full left facetectomy were then performed a, with bone harvested for autograft.  The left foramen was completely unroofed and right foraminotomy performed with rongeurs. .  There was a fair amount of epidural scarring which was quite stuck to the facets and dura and required careful dissection.   Discectomy was then performed on the left side at L4-5 with 15 blade, rongeurs, and various ringed and angled curettes .  Trials were placed and appropriate interbody implants were selected.  The interbody space was then packed with autograft and allograft mixed with ProteiOs.  Under Stealth guidance, the interbody implant was tamped into place and then expanded until snug.   The cage was backfilled and detached.   Good decompression was confirmed with easy passage of ball ended nerve hook.  Tulip heads were placed on the shanks.  Rods were placed bilaterally and secured with screw caps and final tightened.  Final AP and lateral C-arm x-rays showed good placement of the implants.    Remaining autograft and allograft was placed in the lateral gutters between the transverse processes bilaterally.  A 10 flat JP drain was placed in the subfascial space and tunneled out the skin and secured with a stitch.  Wound was irrigated thoroughly.  Exparel mixed with Marcaine was injected into the paraspinous muscles and subcutaneous tissues bilaterally.  Vancomycin powder was sprinkled into the wound.  The fascia was closed with 0 Vicryl stitches.  The dermal layer was closed with 2-0 Vicryl stitches in buried interrupted fashion.  The skin incisions were closed with 4-0 Monocryl subcuticular manner followed by Dermabond.  Sterile dressings were placed.  Patient was then flipped supine and extubated by the anesthesia service following commands and all 4 extremities.  All counts were correct at the end of surgery.  No complications were noted.

## 2022-05-27 LAB — GLUCOSE, CAPILLARY: Glucose-Capillary: 193 mg/dL — ABNORMAL HIGH (ref 70–99)

## 2022-05-27 MED ORDER — HYDROCODONE-ACETAMINOPHEN 5-325 MG PO TABS: 1.0000 | ORAL_TABLET | ORAL | 0 refills | Status: AC | PRN

## 2022-05-27 MED ORDER — METHOCARBAMOL 750 MG PO TABS: 750.0000 mg | ORAL_TABLET | Freq: Three times a day (TID) | ORAL | 2 refills | Status: DC | PRN

## 2022-05-27 MED ORDER — ONDANSETRON HCL 4 MG PO TABS: 4.0000 mg | ORAL_TABLET | Freq: Four times a day (QID) | ORAL | 1 refills | Status: DC | PRN

## 2022-05-27 NOTE — Discharge Summary (Signed)
Discharge Summary   Date of Admission: 05/26/22   Date of Discharge: 05/27/22   Attending Physician:  Hoyt Koch, MD  Hospital Course: Patient was admitted following an uncomplicated revision L4-5 PLIF. They were recovered in PACU and transferred to [facility]. Their preop symptoms were improved. Pt to f/u in office in 2 weeks for post op visit. Pt is in agreement w/ plan.    Neurologic exam at discharge:  A&O x3 Strength 5/5 x4.  SILTx4.  Incisions c/d/I.  Pt ambulating without difficulty.      Discharge diagnosis: L4-5 adjacent segment disease and lumbar stenosis    Patrici Ranks, [facility]

## 2022-05-27 NOTE — Evaluation (Signed)
Occupational Therapy Evaluation and Discharge Patient Details Name: Hunter Mcbride. MRN: [id number] DOB: 17-Apr-1945 Today's Date: 05/27/2022   History of Present Illness Hunter Mcbride is a 77 yo male s/p L4-5  lumbar interbody fusion via left transforaminal approach, including laminectomy, bilateral facetectomies and foraminotomies.   Clinical Impression   [name] admitted and underwent above presents to acute OT with all education completed and post op back handout provided, we will D/C from acute OT.      Recommendations for follow up therapy are one component of a multi-disciplinary discharge planning process, led by the attending physician.  Recommendations may be updated based on patient status, additional functional criteria and insurance authorization.   Assistance Recommended at Discharge PRN  Patient can return home with the following Assistance with cooking/housework;Assist for transportation    Functional Status Assessment  Patient has had a recent decline in their functional status and demonstrates the ability to make significant improvements in function in a reasonable and predictable amount of time. (without further skilled OT needs, all education completed)  Equipment Recommendations  None recommended by OT       Precautions / Restrictions Precautions Precautions: Back Precaution Booklet Issued: Yes (comment) Required Braces or Orthoses: Spinal Brace Spinal Brace: Lumbar corset;Applied in sitting position;Applied in standing position Restrictions Weight Bearing Restrictions: No      Mobility Bed Mobility               General bed mobility comments: pt sitting EOB upon my arrival    Transfers Overall transfer level: Independent                        Balance Overall balance assessment: Mild deficits observed, not formally tested                                         ADL either performed or assessed with clinical  judgement   ADL Overall ADL's : Modified independent                                       General ADL Comments: Educated on LBD, use of pillows for positioning in bed, 2 cups for brushing teeth, wet wipes for back peri care, sit<>stand stance that keeps back more straight, sitting and walking times, wearing of brace, avoiding twisting in when showering     Vision Patient Visual Report: No change from baseline              Pertinent Vitals/Pain Pain Assessment Pain Assessment: Faces Faces Pain Scale: Hurts little more Pain Location: incisional Pain Descriptors / Indicators: Sore Pain Intervention(s): Limited activity within patient's tolerance, Monitored during session     Hand Dominance Right   Extremity/Trunk Assessment Upper Extremity Assessment Upper Extremity Assessment: Overall WFL for tasks assessed           Communication Communication Communication: No difficulties   Cognition Arousal/Alertness: Awake/alert Behavior During Therapy: WFL for tasks assessed/performed Overall Cognitive Status: Within Functional Limits for tasks assessed  Home Living Family/patient expects to be discharged to:: Private residence Living Arrangements: Spouse/significant other Available Help at Discharge: Family;Available 24 hours/day Type of Home: House Home Access: Stairs to enter [employer] of Steps: 7 Entrance Stairs-Rails: Right;Left;Can reach both Home Layout: One level (does have 2 single steps (one into bedroom and one into bathroom))     Bathroom Shower/Tub: Producer, television/film/video: Standard     Home Equipment: Agricultural consultant (2 wheels);BSC/3in1          Prior Functioning/Environment Prior Level of Function : Independent/Modified Independent                        OT Problem List: Decreased range of motion;Impaired balance (sitting and/or  standing);Pain         OT Goals(Current goals can be found in the care plan section) Acute Rehab OT Goals Patient Stated Goal: home today         AM-PAC OT "6 Clicks" Daily Activity     Outcome Measure Help from another person eating meals?: None Help from another person taking care of personal grooming?: None Help from another person toileting, which includes using toliet, bedpan, or urinal?: None Help from another person bathing (including washing, rinsing, drying)?: None Help from another person to put on and taking off regular upper body clothing?: None Help from another person to put on and taking off regular lower body clothing?: None 6 Click Score: 24   End of Session Nurse Communication:  (no further OT needs)  Activity Tolerance: Patient tolerated treatment well Patient left:  (standing at window in room)  OT Visit Diagnosis: Other abnormalities of gait and mobility (R26.89);Muscle weakness (generalized) (M62.81);Pain Pain - part of body:  (incisional)                Time: 7341-9379 OT Time Calculation (min): 30 min Charges:  OT General Charges $OT Visit: 1 Visit OT Evaluation $OT Eval Moderate Complexity: 1 Mod OT Treatments $Self Care/Home Management : 8-22 mins  Hunter Mcbride OT Acute Rehabilitation Services Office [phone]    Hunter Mcbride 05/27/2022, 10:00 AM

## 2022-05-27 NOTE — Evaluation (Signed)
Physical Therapy Evaluation Patient Details Name: Hunter Mcbride. MRN: [id number] DOB: [birth year] Today's Date: 05/27/2022  History of Present Illness  Hunter Mcbride is a 77 yo male s/p L4-5  lumbar interbody fusion via left transforaminal approach, including laminectomy, bilateral facetectomies and foraminotomies.   Clinical Impression  PT eval complete. Pt mod I bed mobility, independent transfers, and mod I amb 350' with RW. Supervision provided for ascend/descend flight of stairs with bilat rails. Pt educated on 3/3 back precautions and brace wear schedule. Handout provided by OT. Pt to d/c home today. No further skilled PT intervention indicated. PT signing off.        Recommendations for follow up therapy are one component of a multi-disciplinary discharge planning process, led by the attending physician.  Recommendations may be updated based on patient status, additional functional criteria and insurance authorization.  Follow Up Recommendations       Assistance Recommended at Discharge PRN  Patient can return home with the following       Equipment Recommendations None recommended by PT  Recommendations for Other Services       Functional Status Assessment Patient has had a recent decline in their functional status and demonstrates the ability to make significant improvements in function in a reasonable and predictable amount of time.     Precautions / Restrictions Precautions Precautions: Back Precaution Booklet Issued: Yes (comment) Precaution Comments: Educated on 3/3 back precautions Required Braces or Orthoses: Spinal Brace Spinal Brace: Lumbar corset;Applied in sitting position Restrictions Weight Bearing Restrictions: No      Mobility  Bed Mobility Overal bed mobility: Modified Independent                  Transfers Overall transfer level: Independent                      Ambulation/Gait Ambulation/Gait assistance: Modified independent  (Device/Increase time) Gait Distance (Feet): 350 Feet Assistive device: Rolling walker (2 wheels) Gait Pattern/deviations: Step-through pattern Gait velocity: WFL Gait velocity interpretation: >2.62 ft/sec, indicative of community ambulatory   General Gait Details: steady gait with RW in hallway. Amb in room without AD, steady gait.  Stairs Stairs: Yes Stairs assistance: Supervision Stair Management: Alternating pattern, Forwards, Two rails Number of Stairs: 12    Wheelchair Mobility    Modified Rankin (Stroke Patients Only)       Balance Overall balance assessment: Mild deficits observed, not formally tested                                           Pertinent Vitals/Pain Pain Assessment Pain Assessment: Faces Faces Pain Scale: Hurts little more Pain Location: incisional Pain Descriptors / Indicators: Discomfort, Sore Pain Intervention(s): Monitored during session    Home Living Family/patient expects to be discharged to:: Private residence Living Arrangements: Spouse/significant other Available Help at Discharge: Family;Available 24 hours/day Type of Home: House Home Access: Stairs to enter Entrance Stairs-Rails: Right;Left;Can reach both Entrance Stairs-Number of Steps: 7   Home Layout: One level Home Equipment: Agricultural consultant (2 wheels);BSC/3in1      Prior Function Prior Level of Function : Independent/Modified Independent                     Hand Dominance   Dominant Hand: Right    Extremity/Trunk Assessment   Upper Extremity Assessment Upper Extremity Assessment: Defer  to OT evaluation    Lower Extremity Assessment Lower Extremity Assessment: Overall WFL for tasks assessed    Cervical / Trunk Assessment Cervical / Trunk Assessment: Back Surgery  Communication   Communication: No difficulties  Cognition Arousal/Alertness: Awake/alert Behavior During Therapy: WFL for tasks assessed/performed Overall Cognitive Status:  Within Functional Limits for tasks assessed                                          General Comments      Exercises     Assessment/Plan    PT Assessment Patient does not need any further PT services  PT Problem List         PT Treatment Interventions      PT Goals (Current goals can be found in the Care Plan section)  Acute Rehab PT Goals Patient Stated Goal: home PT Goal Formulation: All assessment and education complete, DC therapy    Frequency       Co-evaluation               AM-PAC PT "6 Clicks" Mobility  Outcome Measure Help needed turning from your back to your side while in a flat bed without using bedrails?: None Help needed moving from lying on your back to sitting on the side of a flat bed without using bedrails?: None Help needed moving to and from a bed to a chair (including a wheelchair)?: None Help needed standing up from a chair using your arms (e.g., wheelchair or bedside chair)?: None Help needed to walk in hospital room?: None Help needed climbing 3-5 steps with a railing? : A Little 6 Click Score: 23    End of Session Equipment Utilized During Treatment: Back brace Activity Tolerance: Patient tolerated treatment well Patient left: in chair;with family/visitor present;with call bell/phone within reach Nurse Communication: Mobility status PT Visit Diagnosis: Difficulty in walking, not elsewhere classified (R26.2)    Time: 9562-1308 PT Time Calculation (min) (ACUTE ONLY): 20 min   Charges:   PT Evaluation $PT Eval Low Complexity: 1 Low          Ferd Glassing., PT  Office # [phone]   Ilda Foil 05/27/2022, 10:05 AM

## 2022-05-27 NOTE — Discharge Instructions (Signed)
Wound Care Remove outer dressing in 3 days Leave incision open to air. You may shower. Do not scrub directly on incision.  Do not put any creams, lotions, or ointments on incision. Activity Walk each and every day, increasing distance each day. No lifting greater than 8 lbs.  Avoid bending, arching, and twisting. No driving for 2 weeks; may ride as a passenger locally. If provided with back brace, wear when out of bed.  It is not necessary to wear in bed. Diet Resume your normal diet.   Call Your Doctor If Any of These Occur Redness, drainage, or swelling at the wound.  Temperature greater than 101 degrees. Severe pain not relieved by pain medication. Incision starts to come apart. Follow Up Appt Call  [phone])  for problems.  If you have any hardware placed in your spine, you will need an x-ray before your appointment.

## 2022-05-30 MED FILL — Thrombin For Soln 5000 Unit: CUTANEOUS | Qty: 5000 | Status: AC

## 2022-06-01 ENCOUNTER — Ambulatory Visit: Payer: PPO | Admitting: Neurology

## 2022-07-04 ENCOUNTER — Encounter: Payer: Self-pay | Admitting: Neurology

## 2022-07-04 ENCOUNTER — Ambulatory Visit: Payer: PPO | Admitting: Neurology

## 2022-07-04 VITALS — BP 123/73 | HR 69 | Ht 69.0 in | Wt 193.0 lb

## 2022-07-04 DIAGNOSIS — R4189 Other symptoms and signs involving cognitive functions and awareness: Secondary | ICD-10-CM | POA: Diagnosis not present

## 2022-07-04 DIAGNOSIS — F515 Nightmare disorder: Secondary | ICD-10-CM | POA: Diagnosis not present

## 2022-07-04 NOTE — Progress Notes (Signed)
Provider:  Melvyn Novas, MD  Primary Care Physician:  Rodrigo Ran, MD [address] [place] [address]     Referring Provider: Rodrigo Ran, Md [address] [place],  [place] [address]          Chief Complaint according to patient   Patient presents with:                HISTORY OF PRESENT ILLNESS:  Hunter Mcbride. is a 77 y.o. male patient who is here for revisit 07/04/2022 for  memory loss.  He was first presenting with REM sleep BD symptoms. Sleep study confirmed many PLMs without arousals. No apnea. Wife reports these go on ,  yelling out or moving the hands and arms- but not falling out of bed. Not aggressive. He is amnestic for these events.   His work up included a PET scan.  Serum studies: P Tau confirms elevated A -Beta amyloid pathology- and this protein is typically associated  with Alzheimer's disease. It confirms a pathology that helps to differentiate dementias at an early stage of memory loss.   Chief concern according to patient :  "I was given prednisone after back surgery and I felt wired, now improved overall , less pain."  He still presents with rigidity over the right arm and wrist, cogwheeling.     Sleep study : 09-27-2021. The patient had a total of 141 Limb Movements.  The Periodic Limb Movement (PLM) index was 19.1 and the PLM Arousal index was 0.4/hour. Few of these limb movements were noted in REM but there were movements in REM as well as sleep talking. PLMs were clustered in the time between 21.45 and 23.00 hours.  the patient slept on his back during that time. Audio and video analysis do show movements in REM , behaviors, phonations and vocalizations.   EKG was in keeping with normal sinus rhythm (NSR).   IMPRESSION:   1.        No evidence of Obstructive Sleep Apnea (OSA) 2.        There was a cluster of highly active PLMs- Periodic Limb Movement Disorder (PLMD) which reached into REM sleep and would therefore be  considered diagnostic of REM BD. REM sleep somniloquism.      Review of Systems: Out of a complete 14 system review, the patient complains of only the following symptoms, and all other reviewed systems are negative.:  REM BD -   Question of early Alzheimers" versus Lewy body- Cogwheeling.  Fairly normal metabolic PET scan.      Social History   Socioeconomic History   Marital status: Married    Spouse name: Hunter Mcbride   Number of children: 3   Years of education: Not on file   Highest education level: High school graduate  Occupational History   Not on file  Tobacco Use   Smoking status: Never    Passive exposure: Never   Smokeless tobacco: Never  Vaping Use   Vaping Use: Never used  Substance and Sexual Activity   Alcohol use: No   Drug use: No   Sexual activity: Yes  Other Topics Concern   Not on file  Social History Narrative   Lives at home with wife and temporarily young son   Right handed   Caffeine: 1 C of coffee in the AM   Social Determinants of Health   Financial Resource Strain: Not on file  Food Insecurity: Not on  file  Transportation Needs: Not on file  Physical Activity: Not on file  Stress: Not on file  Social Connections: Not on file    Family History  Problem Relation Age of Onset   Colon polyps Mother    Diabetes type II Mother    Heart disease Father    Heart attack Father    Heart disease Brother    Coronary artery disease Cousin    Colon cancer Neg Hx    Pancreatic cancer Neg Hx    Esophageal cancer Neg Hx    Stomach cancer Neg Hx    Liver disease Neg Hx    Rectal cancer Neg Hx     Past Medical History:  Diagnosis Date   Arthritis    "lower back; right shoulder; left thumb; joints" (06/21/2013)   Asthma    "not sure if this is true or not" (06/21/2013)   CAD (coronary artery disease) 06/2013   s/p PTCA/DES x 3 to mid LAD 2015. b. s/p CABG in 2018. c. cath 12/2017 -> med rx.   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) (06/21/2013)   Neuromuscular disorder (HCC)    Osteoarthritis    Skin cancer; face 2023   Sleep apnea    WEIGHT LOSS, NO LONGER NEEDS PER PATIENT   Type II diabetes mellitus (HCC)    TYPE 2    Past Surgical History:  Procedure Laterality Date   CARDIAC CATHETERIZATION  1980's   "once"   CARDIAC CATHETERIZATION N/A 07/23/2015   Procedure: Left Heart Cath and Coronary Angiography;  Surgeon: Tonny Bollman, MD;  Location[facility] INVASIVE CV LAB;  Service: Cardiovascular;  Laterality: N/A;   CARDIOVASCULAR STRESS TEST  06/10/2008   EF 68%   CARPAL TUNNEL RELEASE Bilateral    COLONOSCOPY     CORONARY ANGIOPLASTY WITH STENT PLACEMENT  06/21/2013   "3"   CORONARY ARTERY BYPASS GRAFT N/A 11/07/2016   Procedure: CORONARY ARTERY BYPASS GRAFTING (CABG) x 5 (LIMA to DISTAL LAD, SVG to DIAGONAL, SVG to CIRCUMFLEX, and SVG SEQUENTIALLY to PLB and DISTAL PDA) with EVH of the RIGHT GREATER SAPHENOUS VEIN and LEFT INTERNAL MAMMARY ARTERY HARVEST;  Surgeon: Delight Ovens, MD;  Location: [facility] OR;  Service: Open Heart Surgery;  Laterality: N/A;   CORONARY PRESSURE/FFR STUDY N/A 10/28/2016   Procedure: INTRAVASCULAR PRESSURE WIRE/FFR STUDY;  Surgeon: Marykay Lex, MD;  Location: [facility] INVASIVE CV LAB;  Service: Cardiovascular;  Laterality: N/A;   CORONARY STENT INTERVENTION N/A 06/07/2016   Procedure: Coronary Stent Intervention;  Surgeon: Kathleene Hazel, MD;  Location: [facility] INVASIVE CV LAB;  Service: Cardiovascular;  Laterality: N/A;   EXCISION MORTON'S NEUROMA Left    EYE SURGERY Left    "removed film over my eye"   FRACTIONAL FLOW RESERVE WIRE N/A 06/26/2013   Procedure: FRACTIONAL FLOW RESERVE WIRE;  Surgeon: Iran Ouch, MD;  Location: [facility]  CATH LAB;  Service: Cardiovascular;  Laterality: N/A;   HERNIA REPAIR Left    JOINT REPLACEMENT Left 03/2013   "thumb"   LEFT HEART CATH AND CORONARY ANGIOGRAPHY N/A 06/07/2016   Procedure: Left Heart Cath and Coronary Angiography;  Surgeon: Kathleene Hazel, MD;  Location: [facility] INVASIVE CV LAB;  Service: Cardiovascular;  Laterality: N/A;   LEFT HEART CATH AND CORONARY ANGIOGRAPHY N/A 10/28/2016   Procedure: LEFT HEART CATH AND CORONARY ANGIOGRAPHY;  Surgeon: Marykay Lex, MD;  Location: [facility] INVASIVE CV LAB;  Service: Cardiovascular;  Laterality: N/A;   LEFT HEART CATH AND CORS/GRAFTS ANGIOGRAPHY N/A 12/25/2017   Procedure: LEFT HEART CATH AND CORS/GRAFTS ANGIOGRAPHY;  Surgeon: Tonny Bollman, MD;  Location: [facility] INVASIVE CV LAB;  Service: Cardiovascular;  Laterality: N/A;   LEFT HEART CATH AND CORS/GRAFTS ANGIOGRAPHY N/A 07/26/2019   Procedure: LEFT HEART CATH AND CORS/GRAFTS ANGIOGRAPHY;  Surgeon: Iran Ouch, MD;  Location: [facility] INVASIVE CV LAB;  Service: Cardiovascular;  Laterality: N/A;   LEFT HEART CATHETERIZATION WITH CORONARY ANGIOGRAM N/A 06/21/2013   Procedure: LEFT HEART CATHETERIZATION WITH CORONARY ANGIOGRAM;  Surgeon: Kathleene Hazel, MD;  Location: [facility] CATH LAB;  Service: Cardiovascular;  Laterality: N/A;   LEFT HEART CATHETERIZATION WITH CORONARY ANGIOGRAM N/A 06/26/2013   Procedure: LEFT HEART CATHETERIZATION WITH CORONARY ANGIOGRAM;  Surgeon: Iran Ouch, MD;  Location: [facility] CATH LAB;  Service: Cardiovascular;  Laterality: N/A;   LUMBAR LAMINECTOMY/DECOMPRESSION MICRODISCECTOMY Left 05/30/2018   Procedure: Left  Gill L5 decompression;  Surgeon: Venita Lick, MD;  Location: [facility] OR;  Service: Orthopedics;  Laterality: Left;    POLYPECTOMY     TEE WITHOUT CARDIOVERSION N/A 11/07/2016   Procedure: TRANSESOPHAGEAL ECHOCARDIOGRAM (TEE);  Surgeon: Delight Ovens, MD;  Location: [facility] OR;  Service: Open Heart Surgery;  Laterality: N/A;   TONSILLECTOMY      TOTAL HIP ARTHROPLASTY Right 04/12/2016   Procedure: RIGHT TOTAL HIP ARTHROPLASTY ANTERIOR APPROACH;  Surgeon: Durene Romans, MD;  Location: [facility] ORS;  Service: Orthopedics;  Laterality: Right;  requests 70 mins   TOTAL SHOULDER ARTHROPLASTY  03/01/2012   Procedure: TOTAL SHOULDER ARTHROPLASTY;  Surgeon: Senaida Lange, MD;  Location: [facility] OR;  Service: Orthopedics;  Laterality: Right;   UMBILICAL HERNIA REPAIR       Current Outpatient Medications on File Prior to Visit  Medication Sig Dispense Refill   ALPRAZolam (XANAX) 0.5 MG tablet Take 0.5 mg by mouth 2 (two) times daily as needed for anxiety or sleep.     amLODipine (NORVASC) 5 MG tablet TAKE ONE TABLET BY MOUTH TWICE DAILY 180 tablet 3   aspirin EC 81 MG tablet Take 1 tablet (81 mg total) by mouth at bedtime. 30 tablet 11   atenolol (TENORMIN) 50 MG tablet TAKE ONE TABLET BY MOUTH ONE TIME DAILY at suppertime     atorvastatin (LIPITOR) 80 MG tablet Take 80 mg by mouth every evening.     calcium carbonate (TUMS - DOSED IN MG ELEMENTAL CALCIUM) 500 MG chewable tablet Chew 2 tablets by mouth as needed for indigestion or heartburn.      ciclopirox (LOPROX) 0.77 % cream Apply 1 Application topically daily as needed (irritation).     clopidogrel (PLAVIX) 75 MG tablet Take 1 tablet (75 mg total) by mouth daily with breakfast. 90 tablet 2   docusate sodium (COLACE) 100 MG capsule Take 100 mg by mouth 2 (two) times daily.     empagliflozin (JARDIANCE) 25 MG TABS tablet Take 25 mg by mouth daily.     famotidine (PEPCID) 40 MG tablet Take 1 tablet (40 mg total) by mouth 2 (two) times daily. 60 tablet 5   FREESTYLE TEST STRIPS test strip 1 each by Other route every morning.      gabapentin (NEURONTIN) 300 MG capsule Take  300 mg by mouth at bedtime.     galantamine (RAZADYNE ER) 8 MG 24 hr capsule Take 8 mg by mouth daily with breakfast.     hydrochlorothiazide (HYDRODIURIL) 25 MG tablet Take 25 mg by mouth every morning.     irbesartan (AVAPRO) 300 MG  tablet Take 300 mg by mouth at bedtime.      isosorbide mononitrate (IMDUR) 30 MG 24 hr tablet Take 1 tablet by mouth once daily (Patient taking differently: Take 30 mg by mouth at bedtime.) 90 tablet 3   metFORMIN (GLUCOPHAGE-XR) 500 MG 24 hr tablet Take 1 tablet (500 mg total) by mouth 2 (two) times daily.     methocarbamol (ROBAXIN-750) 750 MG tablet Take 1 tablet (750 mg total) by mouth every 8 (eight) hours as needed for muscle spasms. 90 tablet 2   mupirocin ointment (BACTROBAN) 2 % Apply 1 Application topically 3 (three) times daily as needed (wound care).     Naftifine HCl 2 % CREA Apply 1 Application topically 2 (two) times daily as needed (yeast).     nitroGLYCERIN (NITROSTAT) 0.4 MG SL tablet DISSOLVE 1 TABLET UNDER TONGUE AS NEEDEDFOR CHEST PAIN. MAY REPEAT 5 MINUTES APART 3 TIMES IF NEEDED 25 tablet 11   ondansetron (ZOFRAN) 4 MG tablet Take 1 tablet (4 mg total) by mouth every 6 (six) hours as needed for nausea or vomiting. 20 tablet 1   pantoprazole (PROTONIX) 40 MG tablet Take 1 tablet by mouth 2 (two) times daily.     polyethylene glycol (MIRALAX / GLYCOLAX) 17 g packet Take 17 g by mouth daily.     promethazine (PHENERGAN) 12.5 MG tablet Take 12.5 mg by mouth every 8 (eight) hours as needed for nausea or vomiting.     ranolazine (RANEXA) 500 MG 12 hr tablet TAKE ONE TABLET BY MOUTH TWICE DAILY 180 tablet 3   TRESIBA FLEXTOUCH 100 UNIT/ML FlexTouch Pen Inject 20 Units into the skin daily.     triamcinolone cream (KENALOG) 0.1 % Apply 1 application  topically 2 (two) times daily as needed (irritation).     Vitamin D, Cholecalciferol, 25 MCG (1000 UT) CAPS Take 1 capsule by mouth daily.     No current facility-administered medications on file prior to visit.    Allergies  Allergen Reactions   Hydrocodone Nausea Only    Other reaction(s): nauseated    Methocarbamol Nausea Only    Other reaction(s): nauseated   Sertraline Hcl     Other Reaction(s): zombie   Oxycodone Nausea  Only     DIAGNOSTIC DATA (LABS, IMAGING, TESTING) - I reviewed patient records, labs, notes, testing and imaging myself where available.  Lab Results  Component Value Date   WBC 3.4 (L) 05/20/2022   HGB 15.1 05/20/2022   HCT 46.1 05/20/2022   MCV 93.5 05/20/2022   PLT 127 (L) 05/20/2022      Component Value Date/Time   NA 139 05/20/2022 1100   NA 142 09/27/2021 1112   K 4.6 05/20/2022 1100   CL 102 05/20/2022 1100   CO2 27 05/20/2022 1100   GLUCOSE 247 (H) 05/20/2022 1100   BUN 22 05/20/2022 1100   BUN 26 09/27/2021 1112   CREATININE 1.07 05/20/2022 1100   CREATININE 0.92 07/22/2015 0921   CALCIUM 9.4 05/20/2022 1100   PROT 7.2 02/19/2020 0850   ALBUMIN 4.6 02/19/2020 0850   AST 17 02/19/2020 0850   ALT 24 02/19/2020 0850   ALKPHOS 88 02/19/2020 0850   BILITOT 0.5 02/19/2020 0850  GFRNONAA >60 05/20/2022 1100   GFRAA 58 (L) 02/19/2020 0850   Lab Results  Component Value Date   CHOL 105 02/19/2020   HDL 36 (L) 02/19/2020   LDLCALC 53 02/19/2020   TRIG 78 02/19/2020   CHOLHDL 2.9 02/19/2020   Lab Results  Component Value Date   HGBA1C 7.3 (H) 07/26/2019   No results found for: "VITAMINB12" Lab Results  Component Value Date   TSH 1.532 07/25/2019    PHYSICAL EXAM:  Today's Vitals   07/04/22 1533  BP: 123/73  Pulse: 69  Weight: 193 lb (87.5 kg)  Height: 5\' 9"  (1.753 m)   Body mass index is 28.5 kg/m.   Wt Readings from Last 3 Encounters:  07/04/22 193 lb (87.5 kg)  05/26/22 198 lb 1.6 oz (89.9 kg)  05/20/22 198 lb 1.6 oz (89.9 kg)     Ht Readings from Last 3 Encounters:  07/04/22 5\' 9"  (1.753 m)  05/26/22 5\' 9"  (1.753 m)  05/20/22 5\' 9"  (1.753 m)        07/04/2022    3:54 PM 11/29/2021   10:30 AM 09/27/2021   10:03 AM 05/20/2021   11:36 AM  Montreal Cognitive Assessment   Visuospatial/ Executive (0/5) 5 4 3 4   Naming (0/3) 3 3 3 3   Attention: Read list of digits (0/2) 2 2 2 2   Attention: Read list of letters (0/1) 1 1 1 1   Attention:  Serial 7 subtraction starting at 100 (0/3) 3 3 1 3   Language: Repeat phrase (0/2) 2 2 2 2   Language : Fluency (0/1) 1 1 1 1   Abstraction (0/2) 2 2 2 2   Delayed Recall (0/5) 5 2 4 4   Orientation (0/6) 5 6 6 5   Total 29 26 25 27       General: The patient is awake, alert and appears not in acute distress. The patient is well groomed. Head: Normocephalic, atraumatic.  Neck is supple. Mallampati 2,  neck circumference:17 inches . Nasal airflow  patent. Retrognathia is  seen.  Dental status: biological  Tremor in the tongue.  Cardiovascular:  Regular rate and cardiac rhythm by pulse,  without distended neck veins. Respiratory: Lungs are clear to auscultation.  Skin:  Without evidence of ankle edema, or rash. Trunk: The patient's posture is erect.   Neurologic exam : The patient is awake and alert, oriented to place and time.   Memory subjective described as impaired .  Attention span & concentration ability appears reduced. He is friendly, interactive,  Speech is fluent,  without dysarthria, but with mild dysphonia. He is now looking for words and names- aphasia.   Mood and affect are appropriate. He is very outgoing.    Cranial nerves: no loss of smell or taste reported  Pupils are equal and briskly reactive to light. Funduscopic exam deferred. .  Extraocular movements in vertical and horizontal planes were intact and without nystagmus. No Diplopia. Visual fields by finger perimetry are intact. Hearing was intact to soft voice and finger rubbing.    Facial sensation intact to fine touch.  Facial motor strength is symmetric and tongue and uvula move midline.  Neck ROM : rotation, tilt and flexion extension were normal for age and shoulder shrug was symmetrical.    Motor exam:  Symmetric bulk, and ROM. Strength of hip flexion, adduction, abduction, knee extension is intact  pronator drift left, shoulder ROM restricted in both sids.  Increased tone on right biceps with rigor. Cogwheeling  on both sides!  symmetric  grip strength .   Sensory:  Fine touch and vibration were  normal.  Coordination: Rapid alternating movements in the fingers/hands were of normal speed.  The Finger-to-nose maneuver was intact with evidence of action tremor, not resting tremor .     Gait and station: Patient could rise unassisted from a seated position, walked without assistive device.  Stance is of normal width/ base and the patient turned with 3 steps.  Toe and heel walk were deferred.  Deep tendon reflexes: in the  upper and lower extremities are symmetric and intact. Brisk in upper extremities.  Babinski response was deferred.    ASSESSMENT AND PLAN 77 y.o. year old male  here with:    1) REM BD improved   2) cogwheeling on the right arm, parkinsonian without weakness.  3) MOCA in normal range now.   I plan to follow up either personally or through our NP within 6 months. New MOCA. Referral to neuropsychology.  If  next visit gives evidence of progression,   I will order a DAT scan or amyloid scan , depending on symptom progression.   Dr. Waynard Edwards refilled medication galantamine  8 mg 24 h capsules.  I recommended to continue melatonin.        I would like to thank Rodrigo Ran, MD and Rodrigo Ran, Md [address] [place],  [place] [address] for allowing me to meet with and to take care of this pleasant patient.    After spending a total time of 35  minutes face to face and additional time for physical and neurologic examination, review of laboratory studies,  personal review of imaging studies, reports and results of other testing and review of referral information / records as far as provided in visit,   Electronically signed by: Melvyn Novas, MD 07/04/2022 4:10 PM  [facility] Board certified by The [employer] of Sleep Medicine and Diplomate of the [employer] of [employer] Medicine. Board certified In Neurology through the  ABPN, Fellow of the [employer] of Neu[employer]. Medical Director of [facility].

## 2022-07-12 ENCOUNTER — Encounter: Payer: Self-pay | Admitting: Psychology

## 2022-07-25 DIAGNOSIS — M4326 Fusion of spine, lumbar region: Secondary | ICD-10-CM | POA: Diagnosis not present

## 2022-07-25 DIAGNOSIS — Z6828 Body mass index (BMI) 28.0-28.9, adult: Secondary | ICD-10-CM | POA: Diagnosis not present

## 2022-07-28 DIAGNOSIS — M4326 Fusion of spine, lumbar region: Secondary | ICD-10-CM | POA: Diagnosis not present

## 2022-08-01 ENCOUNTER — Encounter: Payer: Self-pay | Admitting: Cardiovascular Disease

## 2022-08-01 NOTE — Progress Notes (Unsigned)
Hunter Mcbride. Date of Birth  December 19, 1945 [place]  [address]    [place]   [place], [place]  [address]    [place], [place]  [address] [phone]  Fax  [phone]  [phone]  Fax [phone]  Problem list: 1. History of chest pain-negative stress Myoview study in April, 2010 2. Hyperlipidemia 3. Hypertension 4. Diabetes mellitus 5. Sleep apnea   Pauline is doing well.  His quite active. 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 He's not had any episodes of chest pain, shortness breath, syncope, or presyncope.  06/05/2013:    Hunter Mcbride is doing well.  He has been [place] hunting this am.   No CP or dyspnea.  He has noticed that he has some mild vague chest and arm ache - especially with walking.  He can usually walk through the discomfort and the pain will eventually resolve.  He noticed this discomfort this am while hunting.   He has also noticed this while push mowing.   These have been going on for about 1 year.    He had a myoview in 2012 which was negative.    12/04/2015: Hunter Mcbride is doing well. He he has seen [name] for the past 2-1/2 years. Is seen with wife, [name],   Had a cath in June, 2017.   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   Went to the ER in Sept with angina . Thought it may be indigestion.   Was taking NTG .     Imdur was increased . Seems to be improved at this point  Has some orthostatic hypotension because of the increased Imdur dosing .  Is on Ranexa 500 BID   July 25, 2019: Hunter Mcbride is seen back today for evaluation of chest pain.  He has a history of coronary artery disease. Presents with 2 weeks of episodic CP Usually with exertion . Has started radiating to left arm.  Dull ache.  Has significant intrascapular pain .   Dull ache with hot sensation  Took 4 SL NTG on Friday ,  Would ease up for several minutes.  Woke up  frequently Friday night with recurrent cp Had chest soreness since then  Has continued to have these CP  Associated with nausea,   Is very fatigued.  He sat around yesterday ,  Pain eased off.  Last night took more SL nitro, tylenol  August 16, 2019:  Hunter Mcbride is seen today for follow-up visit.  He presented to me last month with episodes of chest discomfort.  We referred him for heart catheterization.  Heart catheterization reveals three-vessel native coronary artery disease with patent grafts including LIMA to LAD, SVG to D2, SVG to OM and SVG to RCA/PDA.  There is been some progression of native coronary artery disease including the RCA but also the ostial left main.  The LIMA is no longer atretic.  No specific reason for the patient's increased anginal symptoms was found.  Medical therapy was continued.  Seems to be doing much better at this point  No further episodes of angina at this point .  Is doing much better in Imdur 30 mg ( which he takes at night )    July 27, 2020: Hunter Mcbride is seen today for follow up of his CAD/ CABG. No angina Able to get out and hunt this past spring  No significant angina  Dr. Waynard Edwards has encouraged him to participate in a study about inflammation  August 02, 2021 Hunter Mcbride is seen today for follow up of his CAD/CABG  No CP. No limitations  Has REM sleep disorder  ( caused him to act out his dreams)  Does not feel rested in the AM  Is seeing Dr. Vickey Huger  ( neuro)  Apparently  - Possibly a precursor to Parkinsons  Lewy Body dementia  Is on melatonin   August 03, 2022 Hunter Mcbride is seen today for follow up of his CAD / CABG   He did not get to [place] hunt  this fall  ( back surgery , fusion )  No CP. No dyspnea   Currently on atorvastatin 80 mg a day.  Lipids from Dr. Laurey Morale office from September, 2023 reveal an LDL of 38, HDL is 29, total cholesterol 76, triglyceride level is 47      Current Outpatient Medications on File Prior to Visit  Medication Sig  Dispense Refill   ALPRAZolam (XANAX) 0.5 MG tablet Take 0.5 mg by mouth 2 (two) times daily as needed for anxiety or sleep.     amLODipine (NORVASC) 5 MG tablet TAKE ONE TABLET BY MOUTH TWICE DAILY 180 tablet 3   aspirin EC 81 MG tablet Take 1 tablet (81 mg total) by mouth at bedtime. 30 tablet 11   atenolol (TENORMIN) 50 MG tablet TAKE ONE TABLET BY MOUTH ONE TIME DAILY at suppertime     atorvastatin (LIPITOR) 80 MG tablet Take 80 mg by mouth every evening.     calcium carbonate (TUMS - DOSED IN MG ELEMENTAL CALCIUM) 500 MG chewable tablet Chew 2 tablets by mouth as needed for indigestion or heartburn.      ciclopirox (LOPROX) 0.77 % cream Apply 1 Application topically daily as needed (irritation).     clopidogrel (PLAVIX) 75 MG tablet Take 1 tablet (75 mg total) by mouth daily with breakfast. 90 tablet 2   docusate sodium (COLACE) 100 MG capsule Take 100 mg by mouth 2 (two) times daily.     empagliflozin (JARDIANCE) 25 MG TABS tablet Take 25 mg by mouth daily.     famotidine (PEPCID) 40 MG tablet Take 1 tablet (40 mg total) by mouth 2 (two) times daily. 60 tablet 5   FREESTYLE TEST STRIPS test strip 1 each by Other route every morning.      gabapentin (NEURONTIN) 300 MG capsule Take 300 mg by mouth at bedtime.     galantamine (RAZADYNE ER) 8 MG 24 hr capsule Take 8 mg by mouth daily with breakfast.     hydrochlorothiazide (HYDRODIURIL) 25 MG tablet Take 25 mg by mouth every morning.     irbesartan (AVAPRO) 300 MG tablet Take 300 mg by mouth at bedtime.      isosorbide mononitrate (IMDUR) 30 MG 24 hr tablet Take 1 tablet by mouth once daily (Patient taking differently: Take 30 mg by mouth at bedtime.) 90 tablet 3   metFORMIN (GLUCOPHAGE-XR) 500 MG 24 hr tablet Take 1 tablet (500 mg total) by mouth 2 (two) times daily.     methocarbamol (ROBAXIN-750) 750 MG tablet Take 1 tablet (750 mg total) by mouth every 8 (eight) hours as needed for muscle spasms. 90 tablet 2   mupirocin ointment (BACTROBAN)  2 % Apply 1 Application topically 3 (three) times daily as needed (wound care).     Naftifine HCl 2 % CREA Apply 1 Application topically 2 (two) times daily as needed (  yeast).     nitroGLYCERIN (NITROSTAT) 0.4 MG SL tablet DISSOLVE 1 TABLET UNDER TONGUE AS NEEDEDFOR CHEST PAIN. MAY REPEAT 5 MINUTES APART 3 TIMES IF NEEDED 25 tablet 11   ondansetron (ZOFRAN) 4 MG tablet Take 1 tablet (4 mg total) by mouth every 6 (six) hours as needed for nausea or vomiting. 20 tablet 1   pantoprazole (PROTONIX) 40 MG tablet Take 1 tablet by mouth 2 (two) times daily.     polyethylene glycol (MIRALAX / GLYCOLAX) 17 g packet Take 17 g by mouth daily.     promethazine (PHENERGAN) 12.5 MG tablet Take 12.5 mg by mouth every 8 (eight) hours as needed for nausea or vomiting.     ranolazine (RANEXA) 500 MG 12 hr tablet TAKE ONE TABLET BY MOUTH TWICE DAILY 180 tablet 3   TRESIBA FLEXTOUCH 100 UNIT/ML FlexTouch Pen Inject 20 Units into the skin daily.     triamcinolone cream (KENALOG) 0.1 % Apply 1 application  topically 2 (two) times daily as needed (irritation).     Vitamin D, Cholecalciferol, 25 MCG (1000 UT) CAPS Take 1 capsule by mouth daily.     No current facility-administered medications on file prior to visit.    Allergies  Allergen Reactions   Hydrocodone Nausea Only    Other reaction(s): nauseated    Methocarbamol Nausea Only    Other reaction(s): nauseated   Sertraline Hcl     Other Reaction(s): zombie   Oxycodone Nausea Only    Past Medical History:  Diagnosis Date   Arthritis    "lower back; right shoulder; left thumb; joints" (06/21/2013)   Asthma    "not sure if this is true or not" (06/21/2013)   CAD (coronary artery disease) 06/2013   s/p PTCA/DES x 3 to mid LAD 2015. b. s/p CABG in 2018. c. cath 12/2017 -> med rx.   Dementia (HCC) 2023   per pt's wife   GERD (gastroesophageal reflux disease)    Gout    "maybe twice in my life"   Headache(784.0)    "weekly for the last 3-4 months"  (06/21/2013)   Hyperlipidemia    Hypertension    Myocardial infarction (HCC)    "/Dr. Alan Mulder had one between 2014-2015) (06/21/2013)   Neuromuscular disorder (HCC)    Osteoarthritis    Skin cancer; face 2023   Sleep apnea    WEIGHT LOSS, NO LONGER NEEDS PER PATIENT   Type II diabetes mellitus (HCC)    TYPE 2    Past Surgical History:  Procedure Laterality Date   CARDIAC CATHETERIZATION  1980's   "once"   CARDIAC CATHETERIZATION N/A 07/23/2015   Procedure: Left Heart Cath and Coronary Angiography;  Surgeon: Tonny Bollman, MD;  Location: [facility] INVASIVE CV LAB;  Service: Cardiovascular;  Laterality: N/A;   CARDIOVASCULAR STRESS TEST  06/10/2008   EF 68%   CARPAL TUNNEL RELEASE Bilateral    COLONOSCOPY     CORONARY ANGIOPLASTY WITH STENT PLACEMENT  06/21/2013   "3"   CORONARY ARTERY BYPASS GRAFT N/A 11/07/2016   Procedure: CORONARY ARTERY BYPASS GRAFTING (CABG) x 5 (LIMA to DISTAL LAD, SVG to DIAGONAL, SVG to CIRCUMFLEX, and SVG SEQUENTIALLY to PLB and DISTAL PDA) with EVH of the RIGHT GREATER SAPHENOUS VEIN and LEFT INTERNAL MAMMARY ARTERY HARVEST;  Surgeon: Delight Ovens, MD;  Location: [facility] OR;  Service: Open Heart Surgery;  Laterality: N/A;   CORONARY PRESSURE/FFR STUDY N/A 10/28/2016   Procedure: INTRAVASCULAR PRESSURE WIRE/FFR STUDY;  Surgeon: Marykay Lex, MD;  Location: [facility] INVASIVE CV LAB;  Service: Cardiovascular;  Laterality: N/A;   CORONARY STENT INTERVENTION N/A 06/07/2016   Procedure: Coronary Stent Intervention;  Surgeon: Kathleene Hazel, MD;  Location: [facility] INVASIVE CV LAB;  Service: Cardiovascular;  Laterality: N/A;   EXCISION MORTON'S NEUROMA Left    EYE SURGERY Left    "removed film over my eye"   FRACTIONAL FLOW RESERVE WIRE N/A 06/26/2013   Procedure: FRACTIONAL FLOW RESERVE WIRE;  Surgeon: Iran Ouch, MD;  Location: [facility] CATH LAB;  Service: Cardiovascular;  Laterality: N/A;   HERNIA REPAIR Left    JOINT REPLACEMENT Left 03/2013   "thumb"   LEFT  HEART CATH AND CORONARY ANGIOGRAPHY N/A 06/07/2016   Procedure: Left Heart Cath and Coronary Angiography;  Surgeon: Kathleene Hazel, MD;  Location: [facility] INVASIVE CV LAB;  Service: Cardiovascular;  Laterality: N/A;   LEFT HEART CATH AND CORONARY ANGIOGRAPHY N/A 10/28/2016   Procedure: LEFT HEART CATH AND CORONARY ANGIOGRAPHY;  Surgeon: Marykay Lex, MD;  Location: [facility] INVASIVE CV LAB;  Service: Cardiovascular;  Laterality: N/A;   LEFT HEART CATH AND CORS/GRAFTS ANGIOGRAPHY N/A 12/25/2017   Procedure: LEFT HEART CATH AND CORS/GRAFTS ANGIOGRAPHY;  Surgeon: Tonny Bollman, MD;  Location: [facility] INVASIVE CV LAB;  Service: Cardiovascular;  Laterality: N/A;   LEFT HEART CATH AND CORS/GRAFTS ANGIOGRAPHY N/A 07/26/2019   Procedure: LEFT HEART CATH AND CORS/GRAFTS ANGIOGRAPHY;  Surgeon: Iran Ouch, MD;  Location: [facility] INVASIVE CV LAB;  Service: Cardiovascular;  Laterality: N/A;   LEFT HEART CATHETERIZATION WITH CORONARY ANGIOGRAM N/A 06/21/2013   Procedure: LEFT HEART CATHETERIZATION WITH CORONARY ANGIOGRAM;  Surgeon: Kathleene Hazel, MD;  Location: [facility] CATH LAB;  Service: Cardiovascular;  Laterality: N/A;   LEFT HEART CATHETERIZATION WITH CORONARY ANGIOGRAM N/A 06/26/2013   Procedure: LEFT HEART CATHETERIZATION WITH CORONARY ANGIOGRAM;  Surgeon: Iran Ouch, MD;  Location: [facility] CATH LAB;  Service: Cardiovascular;  Laterality: N/A;   LUMBAR LAMINECTOMY/DECOMPRESSION MICRODISCECTOMY Left 05/30/2018   Procedure: Left  Gill L5 decompression;  Surgeon: Venita Lick, MD;  Location: [facility] OR;  Service: Orthopedics;  Laterality: Left;    POLYPECTOMY     TEE WITHOUT CARDIOVERSION N/A 11/07/2016   Procedure: TRANSESOPHAGEAL ECHOCARDIOGRAM (TEE);  Surgeon: Delight Ovens, MD;  Location: [facility] OR;  Service: Open Heart Surgery;  Laterality: N/A;   TONSILLECTOMY     TOTAL HIP ARTHROPLASTY Right 04/12/2016   Procedure: RIGHT TOTAL HIP ARTHROPLASTY ANTERIOR APPROACH;  Surgeon: Durene Romans, MD;   Location: [facility] ORS;  Service: Orthopedics;  Laterality: Right;  requests 70 mins   TOTAL SHOULDER ARTHROPLASTY  03/01/2012   Procedure: TOTAL SHOULDER ARTHROPLASTY;  Surgeon: Senaida Lange, MD;  Location: [facility] OR;  Service: Orthopedics;  Laterality: Right;   UMBILICAL HERNIA REPAIR      Social History   Tobacco Use  Smoking Status Never   Passive exposure: Never  Smokeless Tobacco Never    Social History   Substance and Sexual Activity  Alcohol Use No    Family History  Problem Relation Age of Onset   Colon polyps Mother    Diabetes type II Mother    Heart disease Father    Heart attack Father    Heart disease Brother    Coronary artery disease Cousin    Colon cancer Neg Hx    Pancreatic cancer Neg Hx    Esophageal cancer Neg Hx    Stomach cancer Neg Hx    Liver disease Neg Hx    Rectal cancer Neg  Hx     Reviw of Systems:  Reviewed in the HPI.  All other systems are negative.  Physical Exam: Blood pressure 128/80, pulse (!) 57, height 5\' 9"  (1.753 m), weight 196 lb 3.2 oz (89 kg), SpO2 98 %.       GEN:  Well nourished, well developed in no acute distress HEENT: Normal NECK: No JVD; No carotid bruits LYMPHATICS: No lymphadenopathy CARDIAC: RRR , no murmurs, rubs, gallops RESPIRATORY:  Clear to auscultation without rales, wheezing or rhonchi  ABDOMEN: Soft, non-tender, non-distended MUSCULOSKELETAL:  No edema; No deformity  SKIN: Warm and dry NEUROLOGIC:  Alert and oriented x 3    ECG        Assessment / Plan:   1. Coronary artery disease:    He is not having any angina.  His lipid levels are well-controlled.  Continue current medications.  2. Hyperlipidemia: Continue atorvastatin.  His last LDL is very low.     Kristeen Miss, MD  08/03/2022 6:25 PM    [facility] [address] [place],  [address] [place], [place]  [address] Phone: [phone]; Fax: [phone]

## 2022-08-03 ENCOUNTER — Ambulatory Visit: Payer: PPO | Attending: Cardiovascular Disease | Admitting: Cardiovascular Disease

## 2022-08-03 ENCOUNTER — Encounter: Payer: Self-pay | Admitting: Cardiovascular Disease

## 2022-08-03 VITALS — BP 128/80 | HR 57 | Ht 69.0 in | Wt 196.2 lb

## 2022-08-03 DIAGNOSIS — I2510 Atherosclerotic heart disease of native coronary artery without angina pectoris: Secondary | ICD-10-CM | POA: Diagnosis not present

## 2022-08-03 DIAGNOSIS — E785 Hyperlipidemia, unspecified: Secondary | ICD-10-CM

## 2022-08-03 NOTE — Patient Instructions (Signed)
Medication Instructions:  Your physician recommends that you continue on your current medications as directed. Please refer to the Current Medication list given to you today.  *If you need a refill on your cardiac medications before your next appointment, please call your pharmacy*   Lab Work: NONE If you have labs (blood work) drawn today and your tests are completely normal, you will receive your results only by: MyChart Message (if you have MyChart) OR A paper copy in the mail If you have any lab test that is abnormal or we need to change your treatment, we will call you to review the results.   Testing/Procedures: NONE   Follow-Up: At [facility], you and your health needs are our priority.  As part of our continuing mission to provide you with exceptional heart care, we have created designated Provider Care Teams.  These Care Teams include your primary Cardiologist (physician) and Advanced Practice Providers (APPs -  Physician Assistants and Nurse Practitioners) who all work together to provide you with the care you need, when you need it.  We recommend signing up for the patient portal called "MyChart".  Sign up information is provided on this After Visit Summary.  MyChart is used to connect with patients for Virtual Visits (Telemedicine).  Patients are able to view lab/test results, encounter notes, upcoming appointments, etc.  Non-urgent messages can be sent to your provider as well.   To learn more about what you can do with MyChart, go to [email or web address].    Your next appointment:   1 year(s)  Provider:   Philip Nahser, MD      

## 2022-08-04 DIAGNOSIS — M4326 Fusion of spine, lumbar region: Secondary | ICD-10-CM | POA: Diagnosis not present

## 2022-08-11 ENCOUNTER — Other Ambulatory Visit: Payer: Self-pay | Admitting: Cardiovascular Disease

## 2022-08-11 DIAGNOSIS — M4326 Fusion of spine, lumbar region: Secondary | ICD-10-CM | POA: Diagnosis not present

## 2022-08-16 ENCOUNTER — Ambulatory Visit: Payer: PPO | Admitting: Podiatry

## 2022-08-16 ENCOUNTER — Encounter: Payer: Self-pay | Admitting: Podiatry

## 2022-08-16 DIAGNOSIS — E1142 Type 2 diabetes mellitus with diabetic polyneuropathy: Secondary | ICD-10-CM

## 2022-08-16 DIAGNOSIS — M79676 Pain in unspecified toe(s): Secondary | ICD-10-CM

## 2022-08-16 DIAGNOSIS — B351 Tinea unguium: Secondary | ICD-10-CM | POA: Diagnosis not present

## 2022-08-16 NOTE — Progress Notes (Signed)
  Subjective:  Patient ID: Hunter Mcbride., male    DOB: [birth year],   MRN: [id number]  Chief Complaint  Patient presents with   Nail Problem    Routine foot care    77 y.o. male presents for concern of thickened elongated and painful nails that are difficult to trim. Requesting to have them trimmed today. Relates burning and tingling in their feet. Patient is diabetic and last A1c was  Lab Results  Component Value Date   HGBA1C 7.3 (H) 07/26/2019   .   PCP:  Rodrigo Ran, MD    . Denies any other pedal complaints. Denies n/v/f/c.   Past Medical History:  Diagnosis Date   Arthritis    "lower back; right shoulder; left thumb; joints" (06/21/2013)   Asthma    "not sure if this is true or not" (06/21/2013)   CAD (coronary artery disease) 06/2013   s/p PTCA/DES x 3 to mid LAD 2015. b. s/p CABG in 2018. c. cath 12/2017 -> med rx.   Dementia (HCC) 2023   per pt's wife   GERD (gastroesophageal reflux disease)    Gout    "maybe twice in my life"   Headache(784.0)    "weekly for the last 3-4 months" (06/21/2013)   Hyperlipidemia    Hypertension    Myocardial infarction (HCC)    "/Dr. Alan Mulder had one between 2014-2015) (06/21/2013)   Neuromuscular disorder (HCC)    Osteoarthritis    Skin cancer; face 2023   Sleep apnea    WEIGHT LOSS, NO LONGER NEEDS PER PATIENT   Type II diabetes mellitus (HCC)    TYPE 2    Objective:  Physical Exam: Vascular: DP/PT pulses 2/4 bilateral. CFT <3 seconds. Normal hair growth on digits. No edema.  Skin. No lacerations or abrasions bilateral feet. Left hallux nail bed healing well. Nails 1-5 bilateral are thickened elongated and with subungual debris.  Musculoskeletal: MMT 5/5 bilateral lower extremities in DF, PF, Inversion and Eversion. Deceased ROM in DF of ankle joint. No pain to palpation of the foot.  Neurological: Sensation intact to light touch. Protective sensation intact b/l.   Assessment:   1. Pain due to onychomycosis of toenail    2. Diabetic peripheral neuropathy associated with type 2 diabetes mellitus (HCC)        Plan:  Patient was evaluated and treated and all questions answered. -Discussed and educated patient on diabetic foot care, especially with  regards to the vascular, neurological and musculoskeletal systems.  -Stressed the importance of good glycemic control and the detriment of not  controlling glucose levels in relation to the foot. -Discussed supportive shoes at all times and checking feet regularly.  -Mechanically debrided all nails 1-5 bilateral using sterile nail nipper and filed with dremel without incident  -Answered all patient questions -Patient to return  in 3 months for at risk foot care -Patient advised to call the office if any problems or questions arise in the meantime.    Louann Sjogren, DPM

## 2022-08-17 DIAGNOSIS — M4326 Fusion of spine, lumbar region: Secondary | ICD-10-CM | POA: Diagnosis not present

## 2022-08-18 ENCOUNTER — Other Ambulatory Visit: Payer: Self-pay | Admitting: Cardiovascular Disease

## 2022-08-31 ENCOUNTER — Other Ambulatory Visit (HOSPITAL_COMMUNITY): Payer: Self-pay

## 2022-08-31 ENCOUNTER — Other Ambulatory Visit: Payer: Self-pay | Admitting: Neurosurgery

## 2022-08-31 DIAGNOSIS — M4326 Fusion of spine, lumbar region: Secondary | ICD-10-CM | POA: Diagnosis not present

## 2022-08-31 DIAGNOSIS — M5416 Radiculopathy, lumbar region: Secondary | ICD-10-CM

## 2022-08-31 DIAGNOSIS — Z6829 Body mass index (BMI) 29.0-29.9, adult: Secondary | ICD-10-CM | POA: Diagnosis not present

## 2022-08-31 MED ORDER — HYDROCODONE-ACETAMINOPHEN 5-325 MG PO TABS
1.0000 | ORAL_TABLET | Freq: Three times a day (TID) | ORAL | 0 refills | Status: DC | PRN
  Filled 2022-08-31: qty 40, 14d supply, fill #0

## 2022-09-06 DIAGNOSIS — E1169 Type 2 diabetes mellitus with other specified complication: Secondary | ICD-10-CM | POA: Diagnosis not present

## 2022-09-06 DIAGNOSIS — I25119 Atherosclerotic heart disease of native coronary artery with unspecified angina pectoris: Secondary | ICD-10-CM | POA: Diagnosis not present

## 2022-09-06 DIAGNOSIS — I10 Essential (primary) hypertension: Secondary | ICD-10-CM | POA: Diagnosis not present

## 2022-09-06 DIAGNOSIS — Z794 Long term (current) use of insulin: Secondary | ICD-10-CM | POA: Diagnosis not present

## 2022-09-07 DIAGNOSIS — M5416 Radiculopathy, lumbar region: Secondary | ICD-10-CM | POA: Diagnosis not present

## 2022-09-07 DIAGNOSIS — M5136 Other intervertebral disc degeneration, lumbar region: Secondary | ICD-10-CM | POA: Diagnosis not present

## 2022-09-07 DIAGNOSIS — M48061 Spinal stenosis, lumbar region without neurogenic claudication: Secondary | ICD-10-CM | POA: Diagnosis not present

## 2022-09-07 DIAGNOSIS — M545 Low back pain, unspecified: Secondary | ICD-10-CM | POA: Diagnosis not present

## 2022-09-08 ENCOUNTER — Other Ambulatory Visit (HOSPITAL_COMMUNITY): Payer: Self-pay

## 2022-09-08 DIAGNOSIS — Z85828 Personal history of other malignant neoplasm of skin: Secondary | ICD-10-CM | POA: Diagnosis not present

## 2022-09-08 DIAGNOSIS — B356 Tinea cruris: Secondary | ICD-10-CM | POA: Diagnosis not present

## 2022-09-08 MED ORDER — KETOCONAZOLE 2 % EX CREA
1.0000 | TOPICAL_CREAM | Freq: Two times a day (BID) | CUTANEOUS | 2 refills | Status: AC
  Filled 2022-09-08: qty 60, 30d supply, fill #0

## 2022-09-08 MED ORDER — TERBINAFINE HCL 250 MG PO TABS
250.0000 mg | ORAL_TABLET | Freq: Every day | ORAL | 0 refills | Status: DC
  Filled 2022-09-08: qty 30, 30d supply, fill #0

## 2022-09-09 ENCOUNTER — Other Ambulatory Visit (HOSPITAL_COMMUNITY): Payer: Self-pay

## 2022-09-09 DIAGNOSIS — M4726 Other spondylosis with radiculopathy, lumbar region: Secondary | ICD-10-CM | POA: Diagnosis not present

## 2022-09-09 DIAGNOSIS — Z6828 Body mass index (BMI) 28.0-28.9, adult: Secondary | ICD-10-CM | POA: Diagnosis not present

## 2022-09-09 MED ORDER — GABAPENTIN 300 MG PO CAPS
300.0000 mg | ORAL_CAPSULE | Freq: Three times a day (TID) | ORAL | 2 refills | Status: DC
  Filled 2022-09-09: qty 90, 30d supply, fill #0

## 2022-09-09 MED ORDER — HYDROCODONE-ACETAMINOPHEN 5-325 MG PO TABS
1.0000 | ORAL_TABLET | Freq: Three times a day (TID) | ORAL | 0 refills | Status: DC | PRN
  Filled 2022-09-09 (×2): qty 90, 30d supply, fill #0

## 2022-09-12 ENCOUNTER — Telehealth: Payer: Self-pay | Admitting: *Deleted

## 2022-09-12 ENCOUNTER — Other Ambulatory Visit: Payer: Self-pay

## 2022-09-12 NOTE — Telephone Encounter (Signed)
   Pre-operative Risk Assessment    Patient Name: Hunter Mcbride.  DOB: 10/05/1945 MRN: [id number]      Request for Surgical Clearance    Procedure:   LEFT L3-4/TFESI   Date of Surgery:  Clearance TBD (STAT PER THE CLEARANCE REQUEST)                                Surgeon:  DR. DAVE [facility] Surgeon's Group or Practice Name:  [facility] Phone number:  [phone] Fax number:  [phone]   Type of Clearance Requested:   - Medical ; PLAVIX  x 7 DAYS PRIOR AND RESUME DAY AFTER PROCEDURE   Type of Anesthesia:  Not Indicated   Additional requests/questions:    [name]   09/12/2022, 3:26 PM

## 2022-09-12 NOTE — Telephone Encounter (Addendum)
   Patient Name: Hunter Mcbride.  DOB: [birth year] MRN: [id number]  Primary Cardiologist: Kristeen Miss, MD  Chart reviewed as part of pre-operative protocol coverage. Given past medical history and time since last visit, based on ACC/AHA guidelines, Samiel Hedquist. is at acceptable risk for the planned procedure without further cardiovascular testing.  He is able to complete greater than 4 METS of activity and RCRI score is 0.9%.  Patient can hold Plavix 7 days prior to procedure and should restart postprocedure when hemostasis is achieved.  The patient was advised that if he develops new symptoms prior to surgery to contact our office to arrange for a follow-up visit, and he verbalized understanding.  I will route this recommendation to the requesting party via Epic fax function and remove from pre-op pool.  Please call with questions.  Napoleon Form, Leodis Rains, NP 09/12/2022, 3:40 PM

## 2022-09-14 NOTE — Telephone Encounter (Signed)
OUR OFFICE RECEIVED A DUPLICATE REQUEST TODAY. CLEARANCE WAS FAXED TO REQUESTING OFFICE ON 09/12/22 BY Robin Searing, NP AT 3:40 PM.   I WILL RE-FAX NOTES

## 2022-09-19 ENCOUNTER — Other Ambulatory Visit: Payer: PPO

## 2022-09-19 DIAGNOSIS — T148XXA Other injury of unspecified body region, initial encounter: Secondary | ICD-10-CM | POA: Diagnosis not present

## 2022-09-19 DIAGNOSIS — S8012XA Contusion of left lower leg, initial encounter: Secondary | ICD-10-CM | POA: Diagnosis not present

## 2022-09-19 DIAGNOSIS — S8992XA Unspecified injury of left lower leg, initial encounter: Secondary | ICD-10-CM | POA: Diagnosis not present

## 2022-09-20 ENCOUNTER — Other Ambulatory Visit: Payer: Self-pay | Admitting: Surgery

## 2022-09-20 ENCOUNTER — Other Ambulatory Visit (HOSPITAL_COMMUNITY): Payer: Self-pay

## 2022-09-20 DIAGNOSIS — M4726 Other spondylosis with radiculopathy, lumbar region: Secondary | ICD-10-CM

## 2022-09-28 ENCOUNTER — Other Ambulatory Visit (HOSPITAL_COMMUNITY): Payer: Self-pay

## 2022-09-28 DIAGNOSIS — M5416 Radiculopathy, lumbar region: Secondary | ICD-10-CM | POA: Diagnosis not present

## 2022-09-28 DIAGNOSIS — Z6828 Body mass index (BMI) 28.0-28.9, adult: Secondary | ICD-10-CM | POA: Diagnosis not present

## 2022-09-28 MED ORDER — OXYCODONE-ACETAMINOPHEN 5-325 MG PO TABS
1.0000 | ORAL_TABLET | Freq: Four times a day (QID) | ORAL | 0 refills | Status: DC | PRN
  Filled 2022-09-28: qty 50, 13d supply, fill #0

## 2022-10-03 DIAGNOSIS — M5416 Radiculopathy, lumbar region: Secondary | ICD-10-CM | POA: Diagnosis not present

## 2022-10-04 ENCOUNTER — Other Ambulatory Visit: Payer: Self-pay | Admitting: Cardiovascular Disease

## 2022-10-07 DIAGNOSIS — M25552 Pain in left hip: Secondary | ICD-10-CM | POA: Diagnosis not present

## 2022-10-10 ENCOUNTER — Other Ambulatory Visit (HOSPITAL_COMMUNITY): Payer: Self-pay

## 2022-10-10 DIAGNOSIS — Z6828 Body mass index (BMI) 28.0-28.9, adult: Secondary | ICD-10-CM | POA: Diagnosis not present

## 2022-10-10 DIAGNOSIS — M792 Neuralgia and neuritis, unspecified: Secondary | ICD-10-CM | POA: Diagnosis not present

## 2022-10-10 MED ORDER — PREGABALIN 75 MG PO CAPS
75.0000 mg | ORAL_CAPSULE | Freq: Two times a day (BID) | ORAL | 0 refills | Status: DC
  Filled 2022-10-10: qty 60, 30d supply, fill #0

## 2022-10-12 ENCOUNTER — Other Ambulatory Visit: Payer: PPO

## 2022-10-22 DIAGNOSIS — M5417 Radiculopathy, lumbosacral region: Secondary | ICD-10-CM | POA: Diagnosis not present

## 2022-10-24 ENCOUNTER — Other Ambulatory Visit (HOSPITAL_COMMUNITY): Payer: Self-pay

## 2022-10-24 DIAGNOSIS — M4326 Fusion of spine, lumbar region: Secondary | ICD-10-CM | POA: Diagnosis not present

## 2022-10-24 MED ORDER — PREGABALIN 150 MG PO CAPS
150.0000 mg | ORAL_CAPSULE | Freq: Two times a day (BID) | ORAL | 0 refills | Status: DC
  Filled 2022-10-24 – 2022-10-26 (×2): qty 60, 30d supply, fill #0
  Filled ????-??-??: fill #0

## 2022-10-26 ENCOUNTER — Other Ambulatory Visit (HOSPITAL_COMMUNITY): Payer: Self-pay

## 2022-10-26 ENCOUNTER — Other Ambulatory Visit: Payer: Self-pay | Admitting: Neurosurgery

## 2022-10-26 DIAGNOSIS — M4326 Fusion of spine, lumbar region: Secondary | ICD-10-CM

## 2022-10-27 ENCOUNTER — Other Ambulatory Visit (HOSPITAL_COMMUNITY): Payer: Self-pay

## 2022-10-27 MED ORDER — OXYCODONE-ACETAMINOPHEN 5-325 MG PO TABS
1.0000 | ORAL_TABLET | Freq: Four times a day (QID) | ORAL | 0 refills | Status: DC
  Filled 2022-10-27: qty 50, 13d supply, fill #0

## 2022-11-02 ENCOUNTER — Ambulatory Visit
Admission: RE | Admit: 2022-11-02 | Discharge: 2022-11-02 | Disposition: A | Discharge status: Home / Self Care | Payer: PPO | Source: Ambulatory Visit | Attending: Neurosurgery | Admitting: Neurosurgery

## 2022-11-02 DIAGNOSIS — M48061 Spinal stenosis, lumbar region without neurogenic claudication: Secondary | ICD-10-CM | POA: Diagnosis not present

## 2022-11-02 DIAGNOSIS — M4326 Fusion of spine, lumbar region: Secondary | ICD-10-CM

## 2022-11-02 DIAGNOSIS — M47816 Spondylosis without myelopathy or radiculopathy, lumbar region: Secondary | ICD-10-CM | POA: Diagnosis not present

## 2022-11-02 DIAGNOSIS — M5136 Other intervertebral disc degeneration, lumbar region: Secondary | ICD-10-CM | POA: Diagnosis not present

## 2022-11-02 DIAGNOSIS — I700 Atherosclerosis of aorta: Secondary | ICD-10-CM | POA: Diagnosis not present

## 2022-11-08 DIAGNOSIS — Z23 Encounter for immunization: Secondary | ICD-10-CM | POA: Diagnosis not present

## 2022-11-11 DIAGNOSIS — M5416 Radiculopathy, lumbar region: Secondary | ICD-10-CM | POA: Diagnosis not present

## 2022-11-11 DIAGNOSIS — Z6829 Body mass index (BMI) 29.0-29.9, adult: Secondary | ICD-10-CM | POA: Diagnosis not present

## 2022-11-11 DIAGNOSIS — M4326 Fusion of spine, lumbar region: Secondary | ICD-10-CM | POA: Diagnosis not present

## 2022-11-15 DIAGNOSIS — M461 Sacroiliitis, not elsewhere classified: Secondary | ICD-10-CM | POA: Diagnosis not present

## 2022-11-16 ENCOUNTER — Ambulatory Visit: Payer: PPO | Admitting: Podiatry

## 2022-11-16 ENCOUNTER — Encounter: Payer: Self-pay | Admitting: Podiatry

## 2022-11-16 DIAGNOSIS — M4326 Fusion of spine, lumbar region: Secondary | ICD-10-CM | POA: Diagnosis not present

## 2022-11-16 DIAGNOSIS — B351 Tinea unguium: Secondary | ICD-10-CM | POA: Diagnosis not present

## 2022-11-16 DIAGNOSIS — E119 Type 2 diabetes mellitus without complications: Secondary | ICD-10-CM

## 2022-11-16 DIAGNOSIS — M79676 Pain in unspecified toe(s): Secondary | ICD-10-CM | POA: Diagnosis not present

## 2022-11-16 DIAGNOSIS — E1142 Type 2 diabetes mellitus with diabetic polyneuropathy: Secondary | ICD-10-CM | POA: Diagnosis not present

## 2022-11-16 NOTE — Progress Notes (Signed)
Subjective:  Patient ID: Hunter Mcbride., male    DOB: 1945-12-13,   MRN: [id number]  No chief complaint on file.   77 y.o. male presents for concern of thickened elongated and painful nails that are difficult to trim. Requesting to have them trimmed today. Relates burning and tingling in their feet. Patient is diabetic and last A1c was  Lab Results  Component Value Date   HGBA1C 7.3 (H) 07/26/2019   .   PCP:  Rodrigo Ran, MD    . Denies any other pedal complaints. Denies n/v/f/c.   Past Medical History:  Diagnosis Date   Arthritis    "lower back; right shoulder; left thumb; joints" (06/21/2013)   Asthma    "not sure if this is true or not" (06/21/2013)   CAD (coronary artery disease) 06/2013   s/p PTCA/DES x 3 to mid LAD 2015. b. s/p CABG in 2018. c. cath 12/2017 -> med rx.   Dementia (HCC) 2023   per pt's wife   GERD (gastroesophageal reflux disease)    Gout    "maybe twice in my life"   Headache(784.0)    "weekly for the last 3-4 months" (06/21/2013)   Hyperlipidemia    Hypertension    Myocardial infarction (HCC)    "/Dr. Alan Mulder had one between 2014-2015) (06/21/2013)   Neuromuscular disorder (HCC)    Osteoarthritis    Skin cancer; face 2023   Sleep apnea    WEIGHT LOSS, NO LONGER NEEDS PER PATIENT   Type II diabetes mellitus (HCC)    TYPE 2    Objective:  Physical Exam: Vascular: DP/PT pulses 2/4 bilateral. CFT <3 seconds. Normal hair growth on digits. No edema.  Skin. No lacerations or abrasions bilateral feet. Left hallux nail bed healing well. Nails 1-5 bilateral are thickened elongated and with subungual debris.  Musculoskeletal: MMT 5/5 bilateral lower extremities in DF, PF, Inversion and Eversion. Deceased ROM in DF of ankle joint. No pain to palpation of the foot.  Neurological: Sensation intact to light touch. Protective sensation intact b/l.   Assessment:   1. Pain due to onychomycosis of toenail   2. Diabetic peripheral neuropathy associated with  type 2 diabetes mellitus (HCC)        Plan:  Patient was evaluated and treated and all questions answered. -Discussed and educated patient on diabetic foot care, especially with  regards to the vascular, neurological and musculoskeletal systems.  -Stressed the importance of good glycemic control and the detriment of not  controlling glucose levels in relation to the foot. -Discussed supportive shoes at all times and checking feet regularly.  -Mechanically debrided all nails 1-5 bilateral using sterile nail nipper and filed with dremel without incident  -Answered all patient questions -Patient to return  in 3 months for at risk foot care -Patient advised to call the office if any problems or questions arise in the meantime.    Louann Sjogren, DPM

## 2022-11-22 DIAGNOSIS — M4326 Fusion of spine, lumbar region: Secondary | ICD-10-CM | POA: Diagnosis not present

## 2022-11-25 DIAGNOSIS — M4326 Fusion of spine, lumbar region: Secondary | ICD-10-CM | POA: Diagnosis not present

## 2022-11-28 ENCOUNTER — Other Ambulatory Visit (HOSPITAL_COMMUNITY): Payer: Self-pay

## 2022-11-29 ENCOUNTER — Other Ambulatory Visit (HOSPITAL_COMMUNITY): Payer: Self-pay

## 2022-11-29 DIAGNOSIS — M461 Sacroiliitis, not elsewhere classified: Secondary | ICD-10-CM | POA: Diagnosis not present

## 2022-11-29 DIAGNOSIS — M48062 Spinal stenosis, lumbar region with neurogenic claudication: Secondary | ICD-10-CM | POA: Diagnosis not present

## 2022-11-29 MED ORDER — DEXAMETHASONE 6 MG PO TABS
6.0000 mg | ORAL_TABLET | Freq: Three times a day (TID) | ORAL | 0 refills | Status: DC
  Filled 2022-11-29: qty 21, 7d supply, fill #0

## 2022-11-29 MED ORDER — RSVPREF3 VAC RECOMB ADJUVANTED 120 MCG/0.5ML IM SUSR
0.5000 mL | Freq: Once | INTRAMUSCULAR | 0 refills | Status: AC
  Filled 2022-11-29: qty 0.5, 1d supply, fill #0

## 2022-11-29 MED ORDER — OXYCODONE HCL 5 MG PO TABS
5.0000 mg | ORAL_TABLET | Freq: Four times a day (QID) | ORAL | 0 refills | Status: DC
  Filled 2022-11-29: qty 60, 15d supply, fill #0

## 2022-12-12 DIAGNOSIS — Z794 Long term (current) use of insulin: Secondary | ICD-10-CM | POA: Diagnosis not present

## 2022-12-12 DIAGNOSIS — E119 Type 2 diabetes mellitus without complications: Secondary | ICD-10-CM | POA: Diagnosis not present

## 2022-12-12 DIAGNOSIS — I2510 Atherosclerotic heart disease of native coronary artery without angina pectoris: Secondary | ICD-10-CM | POA: Diagnosis not present

## 2022-12-12 DIAGNOSIS — I10 Essential (primary) hypertension: Secondary | ICD-10-CM | POA: Diagnosis not present

## 2022-12-12 DIAGNOSIS — Z01812 Encounter for preprocedural laboratory examination: Secondary | ICD-10-CM | POA: Diagnosis not present

## 2022-12-12 DIAGNOSIS — Z01810 Encounter for preprocedural cardiovascular examination: Secondary | ICD-10-CM | POA: Diagnosis not present

## 2022-12-12 DIAGNOSIS — R413 Other amnesia: Secondary | ICD-10-CM | POA: Diagnosis not present

## 2022-12-12 DIAGNOSIS — Z01818 Encounter for other preprocedural examination: Secondary | ICD-10-CM | POA: Diagnosis not present

## 2022-12-13 DIAGNOSIS — E291 Testicular hypofunction: Secondary | ICD-10-CM | POA: Diagnosis not present

## 2022-12-13 DIAGNOSIS — Z1212 Encounter for screening for malignant neoplasm of rectum: Secondary | ICD-10-CM | POA: Diagnosis not present

## 2022-12-13 DIAGNOSIS — E1169 Type 2 diabetes mellitus with other specified complication: Secondary | ICD-10-CM | POA: Diagnosis not present

## 2022-12-13 DIAGNOSIS — I10 Essential (primary) hypertension: Secondary | ICD-10-CM | POA: Diagnosis not present

## 2022-12-13 DIAGNOSIS — E785 Hyperlipidemia, unspecified: Secondary | ICD-10-CM | POA: Diagnosis not present

## 2022-12-13 DIAGNOSIS — Z1389 Encounter for screening for other disorder: Secondary | ICD-10-CM | POA: Diagnosis not present

## 2022-12-14 ENCOUNTER — Other Ambulatory Visit (HOSPITAL_COMMUNITY): Payer: Self-pay

## 2022-12-14 DIAGNOSIS — R82998 Other abnormal findings in urine: Secondary | ICD-10-CM | POA: Diagnosis not present

## 2022-12-14 DIAGNOSIS — E1169 Type 2 diabetes mellitus with other specified complication: Secondary | ICD-10-CM | POA: Diagnosis not present

## 2022-12-14 DIAGNOSIS — I10 Essential (primary) hypertension: Secondary | ICD-10-CM | POA: Diagnosis not present

## 2022-12-15 ENCOUNTER — Other Ambulatory Visit (HOSPITAL_COMMUNITY): Payer: Self-pay

## 2022-12-15 DIAGNOSIS — I10 Essential (primary) hypertension: Secondary | ICD-10-CM | POA: Diagnosis not present

## 2022-12-15 DIAGNOSIS — Z7984 Long term (current) use of oral hypoglycemic drugs: Secondary | ICD-10-CM | POA: Diagnosis not present

## 2022-12-15 DIAGNOSIS — I252 Old myocardial infarction: Secondary | ICD-10-CM | POA: Diagnosis not present

## 2022-12-15 DIAGNOSIS — M48061 Spinal stenosis, lumbar region without neurogenic claudication: Secondary | ICD-10-CM | POA: Diagnosis not present

## 2022-12-15 DIAGNOSIS — Z7982 Long term (current) use of aspirin: Secondary | ICD-10-CM | POA: Diagnosis not present

## 2022-12-15 DIAGNOSIS — Z96641 Presence of right artificial hip joint: Secondary | ICD-10-CM | POA: Diagnosis not present

## 2022-12-15 DIAGNOSIS — Z955 Presence of coronary angioplasty implant and graft: Secondary | ICD-10-CM | POA: Diagnosis not present

## 2022-12-15 DIAGNOSIS — Z888 Allergy status to other drugs, medicaments and biological substances status: Secondary | ICD-10-CM | POA: Diagnosis not present

## 2022-12-15 DIAGNOSIS — Z951 Presence of aortocoronary bypass graft: Secondary | ICD-10-CM | POA: Diagnosis not present

## 2022-12-15 DIAGNOSIS — Z981 Arthrodesis status: Secondary | ICD-10-CM | POA: Diagnosis not present

## 2022-12-15 DIAGNOSIS — M5416 Radiculopathy, lumbar region: Secondary | ICD-10-CM | POA: Diagnosis not present

## 2022-12-15 DIAGNOSIS — E1142 Type 2 diabetes mellitus with diabetic polyneuropathy: Secondary | ICD-10-CM | POA: Diagnosis not present

## 2022-12-15 DIAGNOSIS — E119 Type 2 diabetes mellitus without complications: Secondary | ICD-10-CM | POA: Diagnosis not present

## 2022-12-15 DIAGNOSIS — Z885 Allergy status to narcotic agent status: Secondary | ICD-10-CM | POA: Diagnosis not present

## 2022-12-15 DIAGNOSIS — Z7902 Long term (current) use of antithrombotics/antiplatelets: Secondary | ICD-10-CM | POA: Diagnosis not present

## 2022-12-15 DIAGNOSIS — K219 Gastro-esophageal reflux disease without esophagitis: Secondary | ICD-10-CM | POA: Diagnosis not present

## 2022-12-15 DIAGNOSIS — Z79899 Other long term (current) drug therapy: Secondary | ICD-10-CM | POA: Diagnosis not present

## 2022-12-15 DIAGNOSIS — Z96611 Presence of right artificial shoulder joint: Secondary | ICD-10-CM | POA: Diagnosis not present

## 2022-12-15 DIAGNOSIS — I2510 Atherosclerotic heart disease of native coronary artery without angina pectoris: Secondary | ICD-10-CM | POA: Diagnosis not present

## 2022-12-15 MED ORDER — OXYCODONE-ACETAMINOPHEN 10-325 MG PO TABS
1.0000 | ORAL_TABLET | Freq: Four times a day (QID) | ORAL | 0 refills | Status: DC | PRN
  Filled 2022-12-15: qty 60, 15d supply, fill #0

## 2022-12-20 ENCOUNTER — Other Ambulatory Visit (HOSPITAL_COMMUNITY): Payer: Self-pay

## 2022-12-20 DIAGNOSIS — I10 Essential (primary) hypertension: Secondary | ICD-10-CM | POA: Diagnosis not present

## 2022-12-20 DIAGNOSIS — Z1339 Encounter for screening examination for other mental health and behavioral disorders: Secondary | ICD-10-CM | POA: Diagnosis not present

## 2022-12-20 DIAGNOSIS — G309 Alzheimer's disease, unspecified: Secondary | ICD-10-CM | POA: Diagnosis not present

## 2022-12-20 DIAGNOSIS — I209 Angina pectoris, unspecified: Secondary | ICD-10-CM | POA: Diagnosis not present

## 2022-12-20 DIAGNOSIS — Z794 Long term (current) use of insulin: Secondary | ICD-10-CM | POA: Diagnosis not present

## 2022-12-20 DIAGNOSIS — I25119 Atherosclerotic heart disease of native coronary artery with unspecified angina pectoris: Secondary | ICD-10-CM | POA: Diagnosis not present

## 2022-12-20 DIAGNOSIS — E1169 Type 2 diabetes mellitus with other specified complication: Secondary | ICD-10-CM | POA: Diagnosis not present

## 2022-12-20 DIAGNOSIS — M1990 Unspecified osteoarthritis, unspecified site: Secondary | ICD-10-CM | POA: Diagnosis not present

## 2022-12-20 DIAGNOSIS — Z0000 Encounter for general adult medical examination without abnormal findings: Secondary | ICD-10-CM | POA: Diagnosis not present

## 2022-12-20 DIAGNOSIS — E669 Obesity, unspecified: Secondary | ICD-10-CM | POA: Diagnosis not present

## 2022-12-20 DIAGNOSIS — G4752 REM sleep behavior disorder: Secondary | ICD-10-CM | POA: Diagnosis not present

## 2022-12-20 DIAGNOSIS — F0280 Dementia in other diseases classified elsewhere without behavioral disturbance: Secondary | ICD-10-CM | POA: Diagnosis not present

## 2022-12-20 DIAGNOSIS — Z1331 Encounter for screening for depression: Secondary | ICD-10-CM | POA: Diagnosis not present

## 2022-12-20 DIAGNOSIS — R413 Other amnesia: Secondary | ICD-10-CM | POA: Diagnosis not present

## 2022-12-20 MED ORDER — PROMETHAZINE HCL 12.5 MG PO TABS
12.5000 mg | ORAL_TABLET | Freq: Two times a day (BID) | ORAL | 1 refills | Status: AC | PRN
  Filled 2022-12-20: qty 30, 30d supply, fill #0
  Filled 2023-03-21: qty 30, 30d supply, fill #1

## 2022-12-21 DIAGNOSIS — M48061 Spinal stenosis, lumbar region without neurogenic claudication: Secondary | ICD-10-CM | POA: Diagnosis not present

## 2023-01-18 ENCOUNTER — Other Ambulatory Visit (HOSPITAL_COMMUNITY): Payer: Self-pay

## 2023-01-18 DIAGNOSIS — R058 Other specified cough: Secondary | ICD-10-CM | POA: Diagnosis not present

## 2023-01-18 DIAGNOSIS — R5383 Other fatigue: Secondary | ICD-10-CM | POA: Diagnosis not present

## 2023-01-18 DIAGNOSIS — J029 Acute pharyngitis, unspecified: Secondary | ICD-10-CM | POA: Diagnosis not present

## 2023-01-18 DIAGNOSIS — Z1152 Encounter for screening for COVID-19: Secondary | ICD-10-CM | POA: Diagnosis not present

## 2023-01-18 DIAGNOSIS — E1169 Type 2 diabetes mellitus with other specified complication: Secondary | ICD-10-CM | POA: Diagnosis not present

## 2023-01-18 DIAGNOSIS — R0981 Nasal congestion: Secondary | ICD-10-CM | POA: Diagnosis not present

## 2023-01-18 MED ORDER — DOXYCYCLINE HYCLATE 100 MG PO TABS
100.0000 mg | ORAL_TABLET | Freq: Two times a day (BID) | ORAL | 0 refills | Status: AC
  Filled 2023-01-18: qty 20, 10d supply, fill #0

## 2023-01-19 ENCOUNTER — Other Ambulatory Visit: Payer: Self-pay | Admitting: Cardiovascular Disease

## 2023-02-22 ENCOUNTER — Ambulatory Visit: Payer: PPO | Admitting: Podiatry

## 2023-02-22 ENCOUNTER — Encounter: Payer: Self-pay | Admitting: Podiatry

## 2023-02-22 DIAGNOSIS — B351 Tinea unguium: Secondary | ICD-10-CM

## 2023-02-22 DIAGNOSIS — M79676 Pain in unspecified toe(s): Secondary | ICD-10-CM | POA: Diagnosis not present

## 2023-02-22 DIAGNOSIS — E1142 Type 2 diabetes mellitus with diabetic polyneuropathy: Secondary | ICD-10-CM

## 2023-02-22 NOTE — Progress Notes (Signed)
   Subjective:  Patient ID: Hunter Hering., male    DOB: 1945-12-13,   MRN: [id number]  No chief complaint on file.   78 y.o. male presents for concern of thickened elongated and painful nails that are difficult to trim. Requesting to have them trimmed today. Relates burning and tingling in their feet. Patient is diabetic and last A1c was  Lab Results  Component Value Date   HGBA1C 7.3 (H) 07/26/2019   .   PCP:  Rodrigo Ran, MD    . Denies any other pedal complaints. Denies n/v/f/c.   Past Medical History:  Diagnosis Date   Arthritis    "lower back; right shoulder; left thumb; joints" (06/21/2013)   Asthma    "not sure if this is true or not" (06/21/2013)   CAD (coronary artery disease) 06/2013   s/p PTCA/DES x 3 to mid LAD 2015. b. s/p CABG in 2018. c. cath 12/2017 -> med rx.   Dementia (HCC) 2023   per pt's wife   GERD (gastroesophageal reflux disease)    Gout    "maybe twice in my life"   Headache(784.0)    "weekly for the last 3-4 months" (06/21/2013)   Hyperlipidemia    Hypertension    Myocardial infarction (HCC)    "/Dr. Alan Mulder had one between 2014-2015) (06/21/2013)   Neuromuscular disorder (HCC)    Osteoarthritis    Skin cancer; face 2023   Sleep apnea    WEIGHT LOSS, NO LONGER NEEDS PER PATIENT   Type II diabetes mellitus (HCC)    TYPE 2    Objective:  Physical Exam: Vascular: DP/PT pulses 2/4 bilateral. CFT <3 seconds. Normal hair growth on digits. No edema.  Skin. No lacerations or abrasions bilateral feet. Left hallux nail bed healing well. Nails 1-5 bilateral are thickened elongated and with subungual debris.  Musculoskeletal: MMT 5/5 bilateral lower extremities in DF, PF, Inversion and Eversion. Deceased ROM in DF of ankle joint. No pain to palpation of the foot.  Neurological: Sensation intact to light touch. Protective sensation intact b/l.   Assessment:   1. Pain due to onychomycosis of toenail   2. Diabetic peripheral neuropathy associated with  type 2 diabetes mellitus (HCC)        Plan:  Patient was evaluated and treated and all questions answered. -Discussed and educated patient on diabetic foot care, especially with  regards to the vascular, neurological and musculoskeletal systems.  -Stressed the importance of good glycemic control and the detriment of not  controlling glucose levels in relation to the foot. -Discussed supportive shoes at all times and checking feet regularly.  -Mechanically debrided all nails 1-5 bilateral using sterile nail nipper and filed with dremel without incident  -Answered all patient questions -Patient to return  in 3 months for at risk foot care -Patient advised to call the office if any problems or questions arise in the meantime.    Louann Sjogren, DPM

## 2023-02-27 DIAGNOSIS — D225 Melanocytic nevi of trunk: Secondary | ICD-10-CM | POA: Diagnosis not present

## 2023-02-27 DIAGNOSIS — L821 Other seborrheic keratosis: Secondary | ICD-10-CM | POA: Diagnosis not present

## 2023-02-27 DIAGNOSIS — D2371 Other benign neoplasm of skin of right lower limb, including hip: Secondary | ICD-10-CM | POA: Diagnosis not present

## 2023-02-27 DIAGNOSIS — L570 Actinic keratosis: Secondary | ICD-10-CM | POA: Diagnosis not present

## 2023-02-27 DIAGNOSIS — L814 Other melanin hyperpigmentation: Secondary | ICD-10-CM | POA: Diagnosis not present

## 2023-02-27 DIAGNOSIS — D692 Other nonthrombocytopenic purpura: Secondary | ICD-10-CM | POA: Diagnosis not present

## 2023-02-27 DIAGNOSIS — L820 Inflamed seborrheic keratosis: Secondary | ICD-10-CM | POA: Diagnosis not present

## 2023-02-27 DIAGNOSIS — Z85828 Personal history of other malignant neoplasm of skin: Secondary | ICD-10-CM | POA: Diagnosis not present

## 2023-02-28 NOTE — Progress Notes (Signed)
 Chief Complaint  Patient presents with   Room 1    Pt is here with his Wife. Pt's wife states that pt's nightmares has gotten better, not nearly as bad as when he first came.     HISTORY OF PRESENT ILLNESS:  03/02/23 ALL:  Hunter Dover. is a 78 y.o. male here today for follow up for memory loss and REM sleep disorder. He was last seen by Dr Albertina Hugger 06/2022 and reported more difficulty with right arm movements, right upper ext rigidity and bilateral cogwheeling on exam since lumbar fusion 05/2022. PET scan and p-tau181 abnormal, concerning for AD. MOCA 29/30. Neuropsych referral placed and galantamine  continued per PCP. He underwent L4-5 microdiscectomy with Dr Laverna Pott 12/15/2022.   Since, he reports doing well. Back pain is significantly improved. He is no longer taking opiate pain medications. Discontinued Lyrica . Now taking gabapentin  300mg  at bedtime.   He feels memory is stable. He has most difficulty remembering names of people. He usually can figure it out with time. He completed ADLs independently. He manages medicaitons. Able to manage finances. He drives without any difficulty. He is sleeping well. Dreams are less frequent and he does not seem to be as restless.   He continues Plavix  and asa. Also taking atorvastatin  80mg . BP stable. A1C 11/2022 was 8.9. He continues close follow up with Dr Genelle Kennedy. He is very active. Trying to monitor diet.   HISTORY (copied from Dr Dohmeier's previous note)  Hunter Dover. is a 78 y.o. male patient who is here for revisit 07/04/2022 for  memory loss.  He was first presenting with REM sleep BD symptoms. Sleep study confirmed many PLMs without arousals. No apnea. Wife reports these go on ,  yelling out or moving the hands and arms- but not falling out of bed. Not aggressive. He is amnestic for these events.   His work up included a PET scan.  Serum studies: P Tau confirms elevated A -Beta amyloid pathology- and this protein is typically  associated  with Alzheimer's disease. It confirms a pathology that helps to differentiate dementias at an early stage of memory loss.     Chief concern according to patient :  "I was given prednisone after back surgery and I felt wired, now improved overall , less pain."  He still presents with rigidity over the right arm and wrist, cogwheeling.      Sleep study : 09-27-2021. The patient had a total of 141 Limb Movements.  The Periodic Limb Movement (PLM) index was 19.1 and the PLM Arousal index was 0.4/hour. Few of these limb movements were noted in REM but there were movements in REM as well as sleep talking. PLMs were clustered in the time between 21.45 and 23.00 hours.  the patient slept on his back during that time. Audio and video analysis do show movements in REM , behaviors, phonations and vocalizations.   EKG was in keeping with normal sinus rhythm (NSR).   IMPRESSION:   1.        No evidence of Obstructive Sleep Apnea (OSA) 2.        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 REM sleep somniloquism.   REVIEW OF SYSTEMS: Out of a complete 14 system review of symptoms, the patient complains only of the following symptoms, restless sleep, memory loss, and all other reviewed systems are  negative.   ALLERGIES: Allergies  Allergen Reactions   Hydrocodone  Nausea Only    Other reaction(s): nauseated    Methocarbamol  Nausea Only    Other reaction(s): nauseated   Sertraline  Hcl     Other Reaction(s): zombie   Oxycodone  Nausea Only     HOME MEDICATIONS: Outpatient Medications Prior to Visit  Medication Sig Dispense Refill   ALPRAZolam  (XANAX ) 0.5 MG tablet Take 0.5 mg by mouth 2 (two) times daily as needed for anxiety or sleep.     amLODipine  (NORVASC ) 5 MG tablet TAKE ONE TABLET BY MOUTH TWICE DAILY 180 tablet 2   aspirin  EC 81 MG tablet Take 1 tablet  (81 mg total) by mouth at bedtime. 30 tablet 11   atenolol  (TENORMIN ) 50 MG tablet TAKE ONE TABLET BY MOUTH ONE TIME DAILY at suppertime     atorvastatin  (LIPITOR ) 80 MG tablet Take 80 mg by mouth every evening.     calcium  carbonate (TUMS - DOSED IN MG ELEMENTAL CALCIUM ) 500 MG chewable tablet Chew 2 tablets by mouth as needed for indigestion or heartburn.      ciclopirox  (LOPROX ) 0.77 % cream Apply 1 Application topically daily as needed (irritation).     clopidogrel  (PLAVIX ) 75 MG tablet TAKE ONE TABLET BY MOUTH DAILY WITH BREAKFAST 90 tablet 3   docusate sodium  (COLACE) 100 MG capsule Take 100 mg by mouth 2 (two) times daily.     empagliflozin  (JARDIANCE ) 25 MG TABS tablet Take 25 mg by mouth daily.     famotidine  (PEPCID ) 40 MG tablet Take 1 tablet (40 mg total) by mouth 2 (two) times daily. 60 tablet 5   FREESTYLE TEST STRIPS test strip 1 each by Other route every morning.      gabapentin  (NEURONTIN ) 300 MG capsule Take 300 mg by mouth at bedtime.     galantamine  (RAZADYNE  ER) 8 MG 24 hr capsule Take 8 mg by mouth daily with breakfast.     hydrochlorothiazide  (HYDRODIURIL ) 25 MG tablet Take 25 mg by mouth every morning.     irbesartan  (AVAPRO ) 300 MG tablet Take 300 mg by mouth at bedtime.      isosorbide  mononitrate (IMDUR ) 30 MG 24 hr tablet Take 1 tablet by mouth once daily 90 tablet 3   ketoconazole  (NIZORAL ) 2 % cream Apply a small amount topically to skin 2 (two) times daily. 60 g 2   melatonin 5 MG TABS Take 5 mg by mouth.     metFORMIN  (GLUCOPHAGE -XR) 500 MG 24 hr tablet Take 1 tablet (500 mg total) by mouth 2 (two) times daily.     methocarbamol  (ROBAXIN -750) 750 MG tablet Take 1 tablet (750 mg total) by mouth every 8 (eight) hours as needed for muscle spasms. 90 tablet 2   Naftifine HCl 2 % CREA Apply 1 Application topically 2 (two) times daily as needed (yeast).     nitroGLYCERIN  (NITROSTAT ) 0.4 MG SL tablet DISSOLVE 1 TABLET UNDER TONGUE AS NEEDEDFOR CHEST PAIN. MAY REPEAT 5  MINUTES APART 3 TIMES IF NEEDED 25 tablet 11   oxyCODONE -acetaminophen  (PERCOCET) 10-325 MG tablet Take 1 tablet by mouth every 6 (six) hours as needed. 60 tablet 0   pantoprazole  (PROTONIX ) 40 MG tablet Take 1 tablet by mouth 2 (two) times daily.     polyethylene glycol (MIRALAX  / GLYCOLAX ) 17 g packet Take 17 g by mouth daily.     promethazine  (PHENERGAN ) 12.5 MG tablet Take 1 tablet (12.5 mg total) by mouth up to 2 (two) times daily as  needed for nausea 30 tablet 1   ranolazine  (RANEXA ) 500 MG 12 hr tablet TAKE ONE TABLET BY MOUTH TWICE DAILY 180 tablet 3   TRESIBA  FLEXTOUCH 100 UNIT/ML FlexTouch Pen Inject 20 Units into the skin daily.     triamcinolone cream (KENALOG) 0.1 % Apply 1 application  topically 2 (two) times daily as needed (irritation).     Vitamin D , Cholecalciferol , 25 MCG (1000 UT) CAPS Take 1 capsule by mouth daily.     mupirocin ointment (BACTROBAN) 2 % Apply 1 Application topically 3 (three) times daily as needed (wound care).     oxyCODONE  (OXY IR/ROXICODONE ) 5 MG immediate release tablet Take 1 tablet (5 mg total) by mouth every 6 (six) hours. 60 tablet 0   oxyCODONE -acetaminophen  (PERCOCET/ROXICET) 5-325 MG tablet Take 1 tablet by mouth every 6 (six) hours as needed for moderate to severe pain. 50 tablet 0   dexamethasone  (DECADRON ) 6 MG tablet Take 1 tablet (6 mg total) by mouth every 8 (eight) hours for 7 days (Patient not taking: Reported on 03/01/2023) 21 tablet 0   gabapentin  (NEURONTIN ) 300 MG capsule Take 1 capsule (300 mg total) by mouth 3 (three) times daily. (Patient not taking: Reported on 03/01/2023) 90 capsule 2   HYDROcodone -acetaminophen  (NORCO/VICODIN) 5-325 MG tablet Take 1 tablet by mouth every 8 (eight) hours as needed for severe pain. (Patient not taking: Reported on 03/01/2023) 40 tablet 0   ondansetron  (ZOFRAN ) 4 MG tablet Take 1 tablet (4 mg total) by mouth every 6 (six) hours as needed for nausea or vomiting. (Patient not taking: Reported on 03/01/2023) 20  tablet 1   oxyCODONE -acetaminophen  (PERCOCET/ROXICET) 5-325 MG tablet Take 1 tablet by mouth every 6 (six) hours as needed for moderate or severe pain (Patient not taking: Reported on 03/01/2023) 50 tablet 0   pregabalin  (LYRICA ) 150 MG capsule Take 1 capsule (150 mg total) by mouth 2 (two) times daily. (Patient not taking: Reported on 03/01/2023) 60 capsule 0   pregabalin  (LYRICA ) 75 MG capsule Take 1 capsule (75 mg total) by mouth 2 (two) times daily. (Patient not taking: Reported on 03/01/2023) 60 capsule 0   promethazine  (PHENERGAN ) 12.5 MG tablet Take 12.5 mg by mouth every 8 (eight) hours as needed for nausea or vomiting. (Patient not taking: Reported on 03/01/2023)     terbinafine  (LAMISIL ) 250 MG tablet Take 1 tablet (250 mg total) by mouth daily. (Patient not taking: Reported on 03/01/2023) 30 tablet 0   No facility-administered medications prior to visit.     PAST MEDICAL HISTORY: Past Medical History:  Diagnosis Date   Arthritis    "lower back; right shoulder; left thumb; joints" (06/21/2013)   Asthma    "not sure if this is true or not" (06/21/2013)   CAD (coronary artery disease) 06/2013   s/p PTCA/DES x 3 to mid LAD 2015. b. s/p CABG in 2018. c. cath 12/2017 -> med rx.   Dementia (HCC) 2023   per pt's wife   GERD (gastroesophageal reflux disease)    Gout    "maybe twice in my life"   Headache(784.0)    "weekly for the last 3-4 months" (06/21/2013)   Hyperlipidemia    Hypertension    Myocardial infarction (HCC)    "/Dr. Sharan Dash had one between 2014-2015) (06/21/2013)   Neuromuscular disorder (HCC)    Osteoarthritis    Skin cancer; face 2023   Sleep apnea    WEIGHT LOSS, NO LONGER NEEDS PER PATIENT   Type II diabetes mellitus (HCC)  TYPE 2     PAST SURGICAL HISTORY: Past Surgical History:  Procedure Laterality Date   CARDIAC CATHETERIZATION  1980's   "once"   CARDIAC CATHETERIZATION N/A 07/23/2015   Procedure: Left Heart Cath and Coronary Angiography;  Surgeon:  Arnoldo Lapping, MD;  Location: [facility] INVASIVE CV LAB;  Service: Cardiovascular;  Laterality: N/A;   CARDIOVASCULAR STRESS TEST  06/10/2008   EF 68%   CARPAL TUNNEL RELEASE Bilateral    COLONOSCOPY     CORONARY ANGIOPLASTY WITH STENT PLACEMENT  06/21/2013   "3"   CORONARY ARTERY BYPASS GRAFT N/A 11/07/2016   Procedure: CORONARY ARTERY BYPASS GRAFTING (CABG) x 5 (LIMA to DISTAL LAD, SVG to DIAGONAL, SVG to CIRCUMFLEX, and SVG SEQUENTIALLY to PLB and DISTAL PDA) with EVH of the RIGHT GREATER SAPHENOUS VEIN and LEFT INTERNAL MAMMARY ARTERY HARVEST;  Surgeon: Norita Beauvais, MD;  Location: [facility] OR;  Service: Open Heart Surgery;  Laterality: N/A;   CORONARY PRESSURE/FFR STUDY N/A 10/28/2016   Procedure: INTRAVASCULAR PRESSURE WIRE/FFR STUDY;  Surgeon: Arleen Lacer, MD;  Location: [facility] INVASIVE CV LAB;  Service: Cardiovascular;  Laterality: N/A;   CORONARY STENT INTERVENTION N/A 06/07/2016   Procedure: Coronary Stent Intervention;  Surgeon: Odie Benne, MD;  Location: [facility] INVASIVE CV LAB;  Service: Cardiovascular;  Laterality: N/A;   EXCISION MORTON'S NEUROMA Left    EYE SURGERY Left    "removed film over my eye"   FRACTIONAL FLOW RESERVE WIRE N/A 06/26/2013   Procedure: FRACTIONAL FLOW RESERVE WIRE;  Surgeon: Wenona Hamilton, MD;  Location: [facility] CATH LAB;  Service: Cardiovascular;  Laterality: N/A;   HERNIA REPAIR Left    JOINT REPLACEMENT Left 03/2013   "thumb"   LEFT HEART CATH AND CORONARY ANGIOGRAPHY N/A 06/07/2016   Procedure: Left Heart Cath and Coronary Angiography;  Surgeon: Odie Benne, MD;  Location: [name] INVASIVE CV LAB;  Service: Cardiovascular;  Laterality: N/A;   LEFT HEART CATH AND CORONARY ANGIOGRAPHY N/A 10/28/2016   Procedure: LEFT HEART CATH AND CORONARY ANGIOGRAPHY;  Surgeon: Arleen Lacer, MD;  Location: [facility] INVASIVE CV LAB;  Service: Cardiovascular;  Laterality: N/A;   LEFT HEART CATH AND CORS/GRAFTS ANGIOGRAPHY N/A 12/25/2017   Procedure: LEFT HEART  CATH AND CORS/GRAFTS ANGIOGRAPHY;  Surgeon: Arnoldo Lapping, MD;  Location: [facility] INVASIVE CV LAB;  Service: Cardiovascular;  Laterality: N/A;   LEFT HEART CATH AND CORS/GRAFTS ANGIOGRAPHY N/A 07/26/2019   Procedure: LEFT HEART CATH AND CORS/GRAFTS ANGIOGRAPHY;  Surgeon: Wenona Hamilton, MD;  Location: [facility] INVASIVE CV LAB;  Service: Cardiovascular;  Laterality: N/A;   LEFT HEART CATHETERIZATION WITH CORONARY ANGIOGRAM N/A 06/21/2013   Procedure: LEFT HEART CATHETERIZATION WITH CORONARY ANGIOGRAM;  Surgeon: Odie Benne, MD;  Location: [facility] CATH LAB;  Service: Cardiovascular;  Laterality: N/A;   LEFT HEART CATHETERIZATION WITH CORONARY ANGIOGRAM N/A 06/26/2013   Procedure: LEFT HEART CATHETERIZATION WITH CORONARY ANGIOGRAM;  Surgeon: Wenona Hamilton, MD;  Location: [facility] CATH LAB;  Service: Cardiovascular;  Laterality: N/A;   LUMBAR LAMINECTOMY/DECOMPRESSION MICRODISCECTOMY Left 05/30/2018   Procedure: Left  Gill L5 decompression;  Surgeon: Mort Ards, MD;  Location: [facility] OR;  Service: Orthopedics;  Laterality: Left;    POLYPECTOMY     TEE WITHOUT CARDIOVERSION N/A 11/07/2016   Procedure: TRANSESOPHAGEAL ECHOCARDIOGRAM (TEE);  Surgeon: Norita Beauvais, MD;  Location: [facility] OR;  Service: Open Heart Surgery;  Laterality: N/A;   TONSILLECTOMY     TOTAL HIP ARTHROPLASTY Right 04/12/2016   Procedure: RIGHT TOTAL HIP ARTHROPLASTY ANTERIOR APPROACH;  Surgeon: Claiborne Crew, MD;  Location: [facility] ORS;  Service: Orthopedics;  Laterality: Right;  requests 70 mins   TOTAL SHOULDER ARTHROPLASTY  03/01/2012   Procedure: TOTAL SHOULDER ARTHROPLASTY;  Surgeon: Glo Larch, MD;  Location: [facility] OR;  Service: Orthopedics;  Laterality: Right;   UMBILICAL HERNIA REPAIR       FAMILY HISTORY: Family History  Problem Relation Age of Onset   Colon polyps Mother    Diabetes type II Mother    Heart disease Father    Heart attack Father    Heart disease Brother    Coronary artery disease Cousin    Colon  cancer Neg Hx    Pancreatic cancer Neg Hx    Esophageal cancer Neg Hx    Stomach cancer Neg Hx    Liver disease Neg Hx    Rectal cancer Neg Hx      SOCIAL HISTORY: Social History   Socioeconomic History   Marital status: Married    Spouse name: Agricultural engineer   Number of children: 3   Years of education: Not on file   Highest education level: High school graduate  Occupational History   Not on file  Tobacco Use   Smoking status: Never    Passive exposure: Never   Smokeless tobacco: Never  Vaping Use   Vaping status: Never Used  Substance and Sexual Activity   Alcohol  use: No   Drug use: No   Sexual activity: Yes  Other Topics Concern   Not on file  Social History Narrative   Lives at home with wife and temporarily young son   Right handed   Caffeine: 1 C of coffee in the AM   Social Drivers of Health   Financial Resource Strain: Not on file  Food Insecurity: Not on file  Transportation Needs: Not on file  Physical Activity: Not on file  Stress: Not on file  Social Connections: Unknown (11/28/2022)   [facility]   Social Network    Social Network: Not on file  Intimate Partner Violence: Unknown (12/12/2022)   Received from [facility]   HITS    Over the last 12 months how often did your partner physically hurt you?: Not on file    Insult or Talk Down To: Not on file    Threaten Physical Harm: Not on file    Scream or Curse: Not on file     PHYSICAL EXAM  Vitals:   03/01/23 1107  BP: 129/74  Pulse: 60  Weight: 194 lb (88 kg)  Height: 5\' 9"  (1.753 m)   Body mass index is 28.65 kg/m.  Generalized: Well developed, in no acute distress  Cardiology: normal rate and rhythm, no murmur auscultated  Respiratory: clear to auscultation bilaterally    Neurological examination  Mentation: Alert oriented to time, place, history taking. Follows all commands speech and language fluent Cranial nerve II-XII: Pupils were equal round reactive to  light. Extraocular movements were full, visual field were full on confrontational test. Facial sensation and strength were normal. Uvula tongue midline. Head turning and shoulder shrug  were normal and symmetric. Motor: The motor testing reveals 5 over 5 strength of all 4 extremities. Good symmetric motor tone is noted throughout. No tremor or cogwheeling noted  Sensory: Sensory testing is intact to soft touch on all 4 extremities. No evidence of extinction is noted.  Coordination: Cerebellar testing reveals good finger-nose-finger and heel-to-shin bilaterally.  Gait and station: Gait is normal.  Reflexes: Deep tendon reflexes  are symmetric and normal bilaterally.    DIAGNOSTIC DATA (LABS, IMAGING, TESTING) - I reviewed patient records, labs, notes, testing and imaging myself where available.  Lab Results  Component Value Date   WBC 3.4 (L) 05/20/2022   HGB 15.1 05/20/2022   HCT 46.1 05/20/2022   MCV 93.5 05/20/2022   PLT 127 (L) 05/20/2022      Component Value Date/Time   NA 139 05/20/2022 1100   NA 142 09/27/2021 1112   K 4.6 05/20/2022 1100   CL 102 05/20/2022 1100   CO2 27 05/20/2022 1100   GLUCOSE 247 (H) 05/20/2022 1100   BUN 22 05/20/2022 1100   BUN 26 09/27/2021 1112   CREATININE 1.07 05/20/2022 1100   CREATININE 0.92 07/22/2015 0921   CALCIUM  9.4 05/20/2022 1100   PROT 7.2 02/19/2020 0850   ALBUMIN  4.6 02/19/2020 0850   AST 17 02/19/2020 0850   ALT 24 02/19/2020 0850   ALKPHOS 88 02/19/2020 0850   BILITOT 0.5 02/19/2020 0850   GFRNONAA >60 05/20/2022 1100   GFRAA 58 (L) 02/19/2020 0850   Lab Results  Component Value Date   CHOL 105 02/19/2020   HDL 36 (L) 02/19/2020   LDLCALC 53 02/19/2020   TRIG 78 02/19/2020   CHOLHDL 2.9 02/19/2020   Lab Results  Component Value Date   HGBA1C 7.3 (H) 07/26/2019   No results found for: "VITAMINB12" Lab Results  Component Value Date   TSH 1.532 07/25/2019        No data to display              07/04/2022     3:54 PM 11/29/2021   10:30 AM 09/27/2021   10:03 AM 05/20/2021   11:36 AM  Montreal Cognitive Assessment   Visuospatial/ Executive (0/5) 5 4 3 4   Naming (0/3) 3 3 3 3   Attention: Read list of digits (0/2) 2 2 2 2   Attention: Read list of letters (0/1) 1 1 1 1   Attention: Serial 7 subtraction starting at 100 (0/3) 3 3 1 3   Language: Repeat phrase (0/2) 2 2 2 2   Language : Fluency (0/1) 1 1 1 1   Abstraction (0/2) 2 2 2 2   Delayed Recall (0/5) 5 2 4 4   Orientation (0/6) 5 6 6 5   Total 29 26 25 27      ASSESSMENT AND PLAN  78 y.o. year old male  has a past medical history of Arthritis, Asthma, CAD (coronary artery disease) (06/2013), Dementia (HCC) (2023), GERD (gastroesophageal reflux disease), Gout, Headache(784.0), Hyperlipidemia, Hypertension, Myocardial infarction [facility]), Neuromuscular disorder (HCC), Osteoarthritis, Skin cancer; face (2023), Sleep apnea, and Type II diabetes mellitus (HCC). here with    MCI (mild cognitive impairment) with memory loss  Abnormal PET scan of head  Cognitive changes  Nightmares REM-sleep type   Hunter Dover. Reports doing well, today. We will continue to monitor for now. Back and neck pain significant improved following surgery. Neuro exam intact, today. We have discussed abnormal p-tau181 and PET scan. Could consider additional workup if interested in infusion therapy. He is considering participating in research trial with [employer]. Unsure of name. Recent blood work taken. He will continue galantamine  for now per PCP. MOCA declined by patient. He has appt with neuropsychology 05/2023 but considering cancelling pending results of blood work. Discussed pros and cons of neuropsychology consult. Memory compensation strategies reviewed. Healthy lifestyle habits encouraged. He will follow up with PCP as directed. He will return to see Dr Albertina Hugger in 3-4  months to review options for additional workup/consideration of AD infusion treatment. He and his wife  verbalize understanding and agreement with this plan.    No orders of the defined types were placed in this encounter.    No orders of the defined types were placed in this encounter.    Terrilyn Fick, MSN, FNP-C 03/02/2023, 10:20 AM  [facility] [address], [address] [place], [place] [address] [phone]

## 2023-02-28 NOTE — Patient Instructions (Signed)
 Below is our plan:  We will continue to monitor symptoms.   Please make sure you are staying well hydrated. I recommend 50-60 ounces daily. Well balanced diet and regular exercise encouraged. Consistent sleep schedule with 6-8 hours recommended.   Please continue follow up with care team as directed.   Follow up with Dr Albertina Hugger in 3-4 months   You may receive a survey regarding today's visit. I encourage you to leave honest feed back as I do use this information to improve patient care. Thank you for seeing me today!   Management of Memory Problems   There are some general things you can do to help manage your memory problems.  Your memory may not in fact recover, but by using techniques and strategies you will be able to manage your memory difficulties better.   1)  Establish a routine. Try to establish and then stick to a regular routine.  By doing this, you will get used to what to expect and you will reduce the need to rely on your memory.  Also, try to do things at the same time of day, such as taking your medication or checking your calendar first thing in the morning. Think about think that you can do as a part of a regular routine and make a list.  Then enter them into a daily planner to remind you.  This will help you establish a routine.   2)  Organize your environment. Organize your environment so that it is uncluttered.  Decrease visual stimulation.  Place everyday items such as keys or cell phone in the same place every day (ie.  Basket next to front door) Use post it notes with a brief message to yourself (ie. Turn off light, lock the door) Use labels to indicate where things go (ie. Which cupboards are for food, dishes, etc.) Keep a notepad and pen by the telephone to take messages   3)  Memory Aids A diary or journal/notebook/daily planner Making a list (shopping list, chore list, to do list that needs to be done) Using an alarm as a reminder (kitchen timer or cell phone  alarm) Using cell phone to store information (Notes, Calendar, Reminders) Calendar/White board placed in a prominent position Post-it notes   In order for memory aids to be useful, you need to have good habits.  It's no good remembering to make a note in your journal if you don't remember to look in it.  Try setting aside a certain time of day to look in journal.   4)  Improving mood and managing fatigue. There may be other factors that contribute to memory difficulties.  Factors, such as anxiety, depression and tiredness can affect memory. Regular gentle exercise can help improve your mood and give you more energy. Exercise: there are short videos created by the [employer] specially for older adults: https://bit.ly/2I30q97.  Mediterranean diet: which emphasizes fruits, vegetables, whole grains, legumes, fish, and other seafood; unsaturated fats such as olive oils; and low amounts of red meat, eggs, and sweets. A variation of this, called MIND (Mediterranean-DASH Intervention for Neurodegenerative Delay) incorporates the DASH (Dietary Approaches to Stop Hypertension) diet, which has been shown to lower high blood pressure, a risk factor for Alzheimer's disease. More information at: ExitMarketing.de.  Aerobic exercise that improve heart health is also good for the mind.  [employer] have short videos for exercises that you can do at home: [employer]Workshop[employer]com.pt Simple relaxation techniques may help relieve symptoms  of anxiety Try to get back to completing activities or hobbies you enjoyed doing in the past. Learn to pace yourself through activities to decrease fatigue. Find out about some local support groups where you can share experiences with others. Try and achieve 7-8 hours of sleep at night.   Tasks to improve attention/working memory 1. Good sleep hygiene (7-8 hrs of sleep) 2.  Learning a new skill (Painting, Carpentry, Pottery, new language, Knitting). 3.Cognitive exercises (keep a daily journal, Puzzles) 4. Physical exercise and training  (30 min/day X 4 days week) 5. Being on Antidepressant if needed 6.Yoga, Meditation, Tai Chi 7. Decrease alcohol  intake 8.Have a clear schedule and structure in daily routine   MIND Diet: The Mediterranean-DASH Diet Intervention for Neurodegenerative Delay, or MIND diet, targets the health of the aging brain. Research participants with the highest MIND diet scores had a significantly slower rate of cognitive decline compared with those with the lowest scores. The effects of the MIND diet on cognition showed greater effects than either the Mediterranean or the DASH diet alone.   The healthy items the MIND diet guidelines suggest include:   3+ servings a day of whole grains 1+ servings a day of vegetables (other than green leafy) 6+ servings a week of green leafy vegetables 5+ servings a week of nuts 4+ meals a week of beans 2+ servings a week of berries 2+ meals a week of poultry 1+ meals a week of fish Mainly olive oil if added fat is used   The unhealthy items, which are higher in saturated and trans fat, include: Less than 5 servings a week of pastries and sweets Less than 4 servings a week of red meat (including beef, pork, lamb, and products made from these meats) Less than one serving a week of cheese and fried foods Less than 1 tablespoon a day of butter/stick margarine

## 2023-03-01 ENCOUNTER — Ambulatory Visit: Payer: PPO | Admitting: Family Medicine

## 2023-03-01 ENCOUNTER — Encounter: Payer: Self-pay | Admitting: Family Medicine

## 2023-03-01 VITALS — BP 129/74 | HR 60 | Ht 69.0 in | Wt 194.0 lb

## 2023-03-01 DIAGNOSIS — G3184 Mild cognitive impairment, so stated: Secondary | ICD-10-CM | POA: Diagnosis not present

## 2023-03-01 DIAGNOSIS — F515 Nightmare disorder: Secondary | ICD-10-CM

## 2023-03-01 DIAGNOSIS — R4189 Other symptoms and signs involving cognitive functions and awareness: Secondary | ICD-10-CM

## 2023-03-01 DIAGNOSIS — R9402 Abnormal brain scan: Secondary | ICD-10-CM

## 2023-03-15 ENCOUNTER — Other Ambulatory Visit: Payer: Self-pay | Admitting: Cardiovascular Disease

## 2023-03-24 ENCOUNTER — Other Ambulatory Visit (HOSPITAL_COMMUNITY): Payer: Self-pay

## 2023-03-24 DIAGNOSIS — Z1152 Encounter for screening for COVID-19: Secondary | ICD-10-CM | POA: Diagnosis not present

## 2023-03-24 DIAGNOSIS — R0981 Nasal congestion: Secondary | ICD-10-CM | POA: Diagnosis not present

## 2023-03-24 DIAGNOSIS — I10 Essential (primary) hypertension: Secondary | ICD-10-CM | POA: Diagnosis not present

## 2023-03-24 DIAGNOSIS — G43909 Migraine, unspecified, not intractable, without status migrainosus: Secondary | ICD-10-CM | POA: Diagnosis not present

## 2023-03-24 DIAGNOSIS — E1169 Type 2 diabetes mellitus with other specified complication: Secondary | ICD-10-CM | POA: Diagnosis not present

## 2023-03-24 DIAGNOSIS — R058 Other specified cough: Secondary | ICD-10-CM | POA: Diagnosis not present

## 2023-03-24 DIAGNOSIS — B349 Viral infection, unspecified: Secondary | ICD-10-CM | POA: Diagnosis not present

## 2023-03-24 DIAGNOSIS — J45909 Unspecified asthma, uncomplicated: Secondary | ICD-10-CM | POA: Diagnosis not present

## 2023-03-24 MED ORDER — HYDROCOD POLI-CHLORPHE POLI ER 10-8 MG/5ML PO SUER
5.0000 mL | Freq: Two times a day (BID) | ORAL | 0 refills | Status: AC | PRN
  Filled 2023-03-24: qty 100, 10d supply, fill #0

## 2023-03-24 MED ORDER — AZITHROMYCIN 250 MG PO TABS
ORAL_TABLET | ORAL | 0 refills | Status: AC
  Filled 2023-03-24: qty 6, 5d supply, fill #0

## 2023-04-11 ENCOUNTER — Other Ambulatory Visit (HOSPITAL_COMMUNITY): Payer: Self-pay

## 2023-04-11 DIAGNOSIS — I25119 Atherosclerotic heart disease of native coronary artery with unspecified angina pectoris: Secondary | ICD-10-CM | POA: Diagnosis not present

## 2023-04-11 DIAGNOSIS — E669 Obesity, unspecified: Secondary | ICD-10-CM | POA: Diagnosis not present

## 2023-04-11 DIAGNOSIS — E1169 Type 2 diabetes mellitus with other specified complication: Secondary | ICD-10-CM | POA: Diagnosis not present

## 2023-04-11 DIAGNOSIS — Z794 Long term (current) use of insulin: Secondary | ICD-10-CM | POA: Diagnosis not present

## 2023-04-11 DIAGNOSIS — G4733 Obstructive sleep apnea (adult) (pediatric): Secondary | ICD-10-CM | POA: Diagnosis not present

## 2023-04-11 DIAGNOSIS — I10 Essential (primary) hypertension: Secondary | ICD-10-CM | POA: Diagnosis not present

## 2023-04-11 MED ORDER — NITROGLYCERIN 0.4 MG SL SUBL
0.4000 mg | SUBLINGUAL_TABLET | SUBLINGUAL | 5 refills | Status: AC | PRN
Start: 2023-04-11 — End: ?
  Filled 2023-04-11: qty 25, 10d supply, fill #0
  Filled 2023-12-14: qty 25, 10d supply, fill #1

## 2023-04-12 DIAGNOSIS — H40013 Open angle with borderline findings, low risk, bilateral: Secondary | ICD-10-CM | POA: Diagnosis not present

## 2023-04-17 ENCOUNTER — Other Ambulatory Visit (HOSPITAL_COMMUNITY): Payer: Self-pay

## 2023-04-17 MED ORDER — HYDROCHLOROTHIAZIDE 25 MG PO TABS
25.0000 mg | ORAL_TABLET | Freq: Every morning | ORAL | 3 refills | Status: AC
  Filled 2023-07-07: qty 90, 90d supply, fill #0
  Filled 2023-10-06: qty 90, 90d supply, fill #1
  Filled 2024-01-02: qty 90, 90d supply, fill #2

## 2023-04-17 MED ORDER — ATORVASTATIN CALCIUM 80 MG PO TABS
80.0000 mg | ORAL_TABLET | Freq: Every day | ORAL | 3 refills | Status: DC
  Filled 2023-07-31: qty 90, 90d supply, fill #0
  Filled 2023-10-09: qty 90, 90d supply, fill #1

## 2023-04-17 MED ORDER — IRBESARTAN 300 MG PO TABS
300.0000 mg | ORAL_TABLET | Freq: Every day | ORAL | 3 refills | Status: DC
  Filled 2023-05-08 – 2023-05-15 (×2): qty 90, 90d supply, fill #0
  Filled 2023-08-15 (×2): qty 90, 90d supply, fill #1
  Filled 2023-11-13: qty 90, 90d supply, fill #2

## 2023-04-17 MED ORDER — ATENOLOL 50 MG PO TABS
50.0000 mg | ORAL_TABLET | Freq: Every day | ORAL | 3 refills | Status: DC
  Filled 2023-06-15: qty 90, 90d supply, fill #0
  Filled 2023-09-08: qty 90, 90d supply, fill #1

## 2023-04-17 MED ORDER — METFORMIN HCL ER 500 MG PO TB24
500.0000 mg | ORAL_TABLET | Freq: Two times a day (BID) | ORAL | 3 refills | Status: DC
  Filled 2023-04-17: qty 180, 90d supply, fill #0

## 2023-04-17 MED ORDER — ALPRAZOLAM 0.5 MG PO TABS
0.5000 mg | ORAL_TABLET | Freq: Two times a day (BID) | ORAL | 0 refills | Status: DC | PRN
  Filled 2023-04-17 – 2023-04-19 (×2): qty 60, 30d supply, fill #0

## 2023-04-19 ENCOUNTER — Other Ambulatory Visit: Payer: Self-pay

## 2023-04-19 ENCOUNTER — Other Ambulatory Visit (HOSPITAL_COMMUNITY): Payer: Self-pay

## 2023-04-20 ENCOUNTER — Other Ambulatory Visit (HOSPITAL_COMMUNITY): Payer: Self-pay

## 2023-04-20 MED ORDER — ISOSORBIDE MONONITRATE ER 30 MG PO TB24
30.0000 mg | ORAL_TABLET | Freq: Every day | ORAL | 3 refills | Status: DC
  Filled 2023-04-20: qty 90, 90d supply, fill #0

## 2023-04-21 ENCOUNTER — Other Ambulatory Visit (HOSPITAL_COMMUNITY): Payer: Self-pay

## 2023-04-27 ENCOUNTER — Other Ambulatory Visit (HOSPITAL_COMMUNITY): Payer: Self-pay

## 2023-04-28 ENCOUNTER — Other Ambulatory Visit (HOSPITAL_COMMUNITY): Payer: Self-pay

## 2023-05-05 ENCOUNTER — Other Ambulatory Visit (HOSPITAL_COMMUNITY): Payer: Self-pay

## 2023-05-08 ENCOUNTER — Other Ambulatory Visit (HOSPITAL_COMMUNITY): Payer: Self-pay

## 2023-05-08 MED ORDER — GABAPENTIN 300 MG PO CAPS
300.0000 mg | ORAL_CAPSULE | Freq: Three times a day (TID) | ORAL | 5 refills | Status: DC
  Filled 2023-05-08: qty 90, 30d supply, fill #0
  Filled 2023-06-08: qty 90, 30d supply, fill #1
  Filled 2023-07-29: qty 90, 30d supply, fill #2
  Filled 2023-09-08: qty 90, 30d supply, fill #3
  Filled 2023-10-06: qty 90, 30d supply, fill #4

## 2023-05-08 MED ORDER — PANTOPRAZOLE SODIUM 40 MG PO TBEC
40.0000 mg | DELAYED_RELEASE_TABLET | Freq: Two times a day (BID) | ORAL | 0 refills | Status: DC
  Filled 2023-05-08: qty 180, 90d supply, fill #0

## 2023-05-15 ENCOUNTER — Other Ambulatory Visit (HOSPITAL_COMMUNITY): Payer: Self-pay

## 2023-05-18 ENCOUNTER — Encounter: Payer: PPO | Admitting: Psychology

## 2023-05-19 ENCOUNTER — Other Ambulatory Visit (HOSPITAL_COMMUNITY): Payer: Self-pay

## 2023-05-23 ENCOUNTER — Ambulatory Visit: Payer: PPO | Admitting: Podiatry

## 2023-05-24 ENCOUNTER — Other Ambulatory Visit (HOSPITAL_COMMUNITY): Payer: Self-pay

## 2023-05-24 MED ORDER — GLUCOSE BLOOD VI STRP
ORAL_STRIP | 11 refills | Status: AC
  Filled 2023-05-24: qty 300, 90d supply, fill #0
  Filled 2023-09-08: qty 300, 90d supply, fill #1
  Filled 2023-12-14: qty 300, 90d supply, fill #2

## 2023-05-30 ENCOUNTER — Encounter: Payer: Self-pay | Admitting: Neurology

## 2023-05-30 ENCOUNTER — Ambulatory Visit: Payer: PPO | Admitting: Neurology

## 2023-05-30 VITALS — BP 135/76 | HR 89 | Ht 69.0 in | Wt 196.0 lb

## 2023-05-30 DIAGNOSIS — R4189 Other symptoms and signs involving cognitive functions and awareness: Secondary | ICD-10-CM | POA: Diagnosis not present

## 2023-05-30 DIAGNOSIS — R251 Tremor, unspecified: Secondary | ICD-10-CM

## 2023-05-30 DIAGNOSIS — G4752 REM sleep behavior disorder: Secondary | ICD-10-CM

## 2023-05-30 DIAGNOSIS — G3184 Mild cognitive impairment, so stated: Secondary | ICD-10-CM

## 2023-05-30 DIAGNOSIS — R9402 Abnormal brain scan: Secondary | ICD-10-CM | POA: Diagnosis not present

## 2023-05-30 NOTE — Progress Notes (Signed)
 Provider:  Melvyn Novas, MD  Primary Care Physician:  Rodrigo Ran, MD [name] [place] [address]     Referring Provider: Rodrigo Ran, Md [address] [place],  [place] [address]          Chief Complaint according to patient   Patient presents with:                HISTORY OF PRESENT ILLNESS:  Hunter Mcbride. is a 78 y.o. male patient who is here for revisit 05/30/2023 for  .  Chief concern according to patient :  " I had back surgery in October 2024 and still have some  recovery to go through,"  DM 2,  CAD,  amnestic MCI, spinal stenosis, ope angle glaucoma. He underwent  spinal fusion surgery with Dr Zollie Beckers.     Fam Hx: patient of Hunter Mcbride and caucasian descent.  Here to follow up on memory concerns: he has again excellent MOCA scores, 28/ 30 and reports not so much Short term meory loss as amnestic "blips" when he cannot recall a conversation, an appointmmet or  feels disoriented.   Metabolic MRI PET, 10-2021. Mild bilateral decreased uptake of susttrate in both  parietotemporal lobes. Protein tau was elevated.         03/02/23 ALL:  Hunter Mcbride. is a 78 y.o. male here today for follow up for memory loss and REM sleep disorder. He was last seen by Dr Vickey Huger 06/2022 and reported more difficulty with right arm movements, right upper ext rigidity and bilateral cogwheeling on exam since lumbar fusion 05/2022. PET scan and [id number] abnormal, concerning for AD. MOCA 29/30. Neuropsych referral placed and galantamine continued per PCP. He underwent L4-5 microdiscectomy with Dr Lorenso Courier 12/15/2022.    Since, he reports doing well. Back pain is significantly improved. He is no longer taking opiate pain medications. Discontinued Lyrica. Now taking gabapentin 300mg  at bedtime.    He feels memory is stable. He has most difficulty remembering names of people. He usually can figure it out with time. He completed ADLs independently. He manages  medicaitons. Able to manage finances. He drives without any difficulty. He is sleeping well. Dreams are less frequent and he does not seem to be as restless.    He continues Plavix and asa. Also taking atorvastatin 80mg . BP stable. A1C 11/2022 was 8.9. He continues close follow up with Dr Waynard Edwards. He is very active. Trying to monitor diet.    HISTORY (copied from Dr Ronnel Zuercher's previous note)   Hunter Mcbride. is a 78 y.o. male patient who is here for revisit 07/04/2022 for  memory loss.  He was first presenting with REM sleep BD symptoms. Sleep study confirmed many PLMs without arousals. No apnea. Wife reports these go on ,  yelling out or moving the hands and arms- but not falling out of bed. Not aggressive. He is amnestic for these events.   His work up included a PET scan.  Serum studies: P Tau confirms elevated A -Beta amyloid pathology- and this protein is typically associated  with Alzheimer's disease. It confirms a pathology that helps to differentiate dementias at an early stage of memory loss.     Chief concern according to patient :  "I was given prednisone after back surgery and I felt wired, now improved overall , less pain."  He still presents with rigidity over the right arm and wrist, cogwheeling.  Review of Systems: Out of a complete 14 system review, the patient complains of only the following symptoms, and all other reviewed systems are negative.:    10/ 24 points   takes power naps, but less frequently than last year-  FSS endorsed at 14 from 16/ 63 points.  GDS : 2/ 15 points.     05/30/2023    3:33 PM 07/04/2022    3:54 PM 11/29/2021   10:30 AM 09/27/2021   10:03 AM 05/20/2021   11:36 AM  Montreal Cognitive Assessment   Visuospatial/ Executive (0/5) 4 5 4 3 4   Naming (0/3) 3 3 3 3 3   Attention: Read list of digits (0/2) 2 2 2 2 2   Attention: Read list of letters (0/1) 1 1 1 1 1   Attention: Serial 7 subtraction starting at 100 (0/3) 3 3 3 1 3   Language:  Repeat phrase (0/2) 2 2 2 2 2   Language : Fluency (0/1) 1 1 1 1 1   Abstraction (0/2) 2 2 2 2 2   Delayed Recall (0/5) 3 5 2 4 4   Orientation (0/6) 6 5 6 6 5   Total 27 29 26 25 27      Social History   Socioeconomic History   Marital status: Married    Spouse name: [name]   Number of children: 3   Years of education: Not on file   Highest education level: High school graduate  Occupational History   Not on file  Tobacco Use   Smoking status: Never    Passive exposure: Never   Smokeless tobacco: Never  Vaping Use   Vaping status: Never Used  Substance and Sexual Activity   Alcohol use: No   Drug use: No   Sexual activity: Yes  Other Topics Concern   Not on file  Social History Narrative   Lives at home with wife and temporarily young son   Right handed   Caffeine: 1 C of coffee in the AM   Social Drivers of Health   Financial Resource Strain: Not on file  Food Insecurity: Not on file  Transportation Needs: Not on file  Physical Activity: Not on file  Stress: Not on file  Social Connections: Unknown (11/28/2022)   Received from [employer]   Social Network    Social Network: Not on file    Family History  Problem Relation Age of Onset   Colon polyps Mother    Diabetes type II Mother    Heart disease Father    Heart attack Father    Heart disease Brother    Coronary artery disease Cousin    Colon cancer Neg Hx    Pancreatic cancer Neg Hx    Esophageal cancer Neg Hx    Stomach cancer Neg Hx    Liver disease Neg Hx    Rectal cancer Neg Hx     Past Medical History:  Diagnosis Date   Arthritis    "lower back; right shoulder; left thumb; joints" (06/21/2013)   Asthma    "not sure if this is true or not" (06/21/2013)   CAD (coronary artery disease) 06/2013   s/p PTCA/DES x 3 to mid LAD 2015. b. s/p CABG in 2018. c. cath 12/2017 -> med rx.   Dementia (HCC) 2023   per pt's wife   GERD (gastroesophageal reflux disease)    Gout    "maybe twice in my life"    Headache(784.0)    "weekly for the last 3-4 months" (06/21/2013)   Hyperlipidemia  Hypertension    Myocardial infarction [facility])    "/Dr. Lourena Simmonds I've had one between 2014-2015) (06/21/2013)   Neuromuscular disorder (HCC)    Osteoarthritis    Skin cancer; face 2023   Sleep apnea    WEIGHT LOSS, NO LONGER NEEDS PER PATIENT   Type II diabetes mellitus (HCC)    TYPE 2    Past Surgical History:  Procedure Laterality Date   CARDIAC CATHETERIZATION  1980's   "once"   CARDIAC CATHETERIZATION N/A 07/23/2015   Procedure: Left Heart Cath and Coronary Angiography;  Surgeon: Tonny Bollman, MD;  Location: [facility] INVASIVE CV LAB;  Service: Cardiovascular;  Laterality: N/A;   CARDIOVASCULAR STRESS TEST  06/10/2008   EF 68%   CARPAL TUNNEL RELEASE Bilateral    COLONOSCOPY     CORONARY ANGIOPLASTY WITH STENT PLACEMENT  06/21/2013   "3"   CORONARY ARTERY BYPASS GRAFT N/A 11/07/2016   Procedure: CORONARY ARTERY BYPASS GRAFTING (CABG) x 5 (LIMA to DISTAL LAD, SVG to DIAGONAL, SVG to CIRCUMFLEX, and SVG SEQUENTIALLY to PLB and DISTAL PDA) with EVH of the RIGHT GREATER SAPHENOUS VEIN and LEFT INTERNAL MAMMARY ARTERY HARVEST;  Surgeon: Delight Ovens, MD;  Location: [facility] OR;  Service: Open Heart Surgery;  Laterality: N/A;   CORONARY PRESSURE/FFR STUDY N/A 10/28/2016   Procedure: INTRAVASCULAR PRESSURE WIRE/FFR STUDY;  Surgeon: Marykay Lex, MD;  Location: [facility] INVASIVE CV LAB;  Service: Cardiovascular;  Laterality: N/A;   CORONARY STENT INTERVENTION N/A 06/07/2016   Procedure: Coronary Stent Intervention;  Surgeon: Kathleene Hazel, MD;  Location: [facility] INVASIVE CV LAB;  Service: Cardiovascular;  Laterality: N/A;   EXCISION MORTON'S NEUROMA Left    EYE SURGERY Left    "removed film over my eye"   FRACTIONAL FLOW RESERVE WIRE N/A 06/26/2013   Procedure: FRACTIONAL FLOW RESERVE WIRE;  Surgeon: Iran Ouch, MD;  Location: [facility] CATH LAB;  Service: Cardiovascular;  Laterality: N/A;   HERNIA REPAIR Left     JOINT REPLACEMENT Left 03/2013   "thumb"   LEFT HEART CATH AND CORONARY ANGIOGRAPHY N/A 06/07/2016   Procedure: Left Heart Cath and Coronary Angiography;  Surgeon: Kathleene Hazel, MD;  Location: [facility] INVASIVE CV LAB;  Service: Cardiovascular;  Laterality: N/A;   LEFT HEART CATH AND CORONARY ANGIOGRAPHY N/A 10/28/2016   Procedure: LEFT HEART CATH AND CORONARY ANGIOGRAPHY;  Surgeon: Marykay Lex, MD;  Location: [facility] INVASIVE CV LAB;  Service: Cardiovascular;  Laterality: N/A;   LEFT HEART CATH AND CORS/GRAFTS ANGIOGRAPHY N/A 12/25/2017   Procedure: LEFT HEART CATH AND CORS/GRAFTS ANGIOGRAPHY;  Surgeon: Tonny Bollman, MD;  Location: [facility] INVASIVE CV LAB;  Service: Cardiovascular;  Laterality: N/A;   LEFT HEART CATH AND CORS/GRAFTS ANGIOGRAPHY N/A 07/26/2019   Procedure: LEFT HEART CATH AND CORS/GRAFTS ANGIOGRAPHY;  Surgeon: Iran Ouch, MD;  Location: [facility] INVASIVE CV LAB;  Service: Cardiovascular;  Laterality: N/A;   LEFT HEART CATHETERIZATION WITH CORONARY ANGIOGRAM N/A 06/21/2013   Procedure: LEFT HEART CATHETERIZATION WITH CORONARY ANGIOGRAM;  Surgeon: Kathleene Hazel, MD;  Location: [facility] CATH LAB;  Service: Cardiovascular;  Laterality: N/A;   LEFT HEART CATHETERIZATION WITH CORONARY ANGIOGRAM N/A 06/26/2013   Procedure: LEFT HEART CATHETERIZATION WITH CORONARY ANGIOGRAM;  Surgeon: Iran Ouch, MD;  Location: [facility] CATH LAB;  Service: Cardiovascular;  Laterality: N/A;   LUMBAR LAMINECTOMY/DECOMPRESSION MICRODISCECTOMY Left 05/30/2018   Procedure: Left  Gill L5 decompression;  Surgeon: Venita Lick, MD;  Location: [facility] OR;  Service: Orthopedics;  Laterality: Left;    POLYPECTOMY  TEE WITHOUT CARDIOVERSION N/A 11/07/2016   Procedure: TRANSESOPHAGEAL ECHOCARDIOGRAM (TEE);  Surgeon: Delight Ovens, MD;  Location: [facility] OR;  Service: Open Heart Surgery;  Laterality: N/A;   TONSILLECTOMY     TOTAL HIP ARTHROPLASTY Right 04/12/2016   Procedure: RIGHT TOTAL HIP  ARTHROPLASTY ANTERIOR APPROACH;  Surgeon: Durene Romans, MD;  Location: [facility] ORS;  Service: Orthopedics;  Laterality: Right;  requests 70 mins   TOTAL SHOULDER ARTHROPLASTY  03/01/2012   Procedure: TOTAL SHOULDER ARTHROPLASTY;  Surgeon: Senaida Lange, MD;  Location: [facility] OR;  Service: Orthopedics;  Laterality: Right;   UMBILICAL HERNIA REPAIR       Current Outpatient Medications on File Prior to Visit  Medication Sig Dispense Refill   ALPRAZolam (XANAX) 0.5 MG tablet Take 1 tablet (0.5 mg total) by mouth 2 (two) times daily as needed for anxiety. 60 tablet 0   amLODipine (NORVASC) 5 MG tablet Take 1 tablet (5 mg total) by mouth 2 (two) times daily. 180 tablet 2   aspirin EC 81 MG tablet Take 1 tablet (81 mg total) by mouth at bedtime. 30 tablet 11   atenolol (TENORMIN) 50 MG tablet Take 1 tablet (50 mg total) by mouth daily. 90 tablet 3   atorvastatin (LIPITOR) 80 MG tablet Take 1 tablet (80 mg total) by mouth daily. 90 tablet 3   calcium carbonate (TUMS - DOSED IN MG ELEMENTAL CALCIUM) 500 MG chewable tablet Chew 2 tablets by mouth as needed for indigestion or heartburn.      chlorpheniramine-HYDROcodone (TUSSIONEX) 10-8 MG/5ML Take 5 mLs by mouth every 12 (twelve) hours as needed for cough. Caution: may cause sedation. 100 mL 0   ciclopirox (LOPROX) 0.77 % cream Apply 1 Application topically daily as needed (irritation).     clopidogrel (PLAVIX) 75 MG tablet Take 1 tablet (75 mg total) by mouth daily with breakfast. 90 tablet 3   docusate sodium (COLACE) 100 MG capsule Take 100 mg by mouth 2 (two) times daily.     empagliflozin (JARDIANCE) 25 MG TABS tablet Take 25 mg by mouth daily.     famotidine (PEPCID) 40 MG tablet Take 1 tablet (40 mg total) by mouth 2 (two) times daily. 60 tablet 5   FREESTYLE TEST STRIPS test strip 1 each by Other route every morning.      gabapentin (NEURONTIN) 300 MG capsule Take 1 capsule (300 mg total) by mouth 3 (three) times daily. 90 capsule 5   galantamine  (RAZADYNE ER) 8 MG 24 hr capsule Take 8 mg by mouth daily with breakfast.     glucose blood test strip Use to test blood sugar levels up to 3 (three) times a day. 300 each 11   hydrochlorothiazide (HYDRODIURIL) 25 MG tablet Take 1 tablet (25 mg total) by mouth in the morning. 90 tablet 3   irbesartan (AVAPRO) 300 MG tablet Take 1 tablet (300 mg total) by mouth daily. 90 tablet 3   isosorbide mononitrate (IMDUR) 30 MG 24 hr tablet Take 1 tablet (30 mg total) by mouth daily. 90 tablet 3   ketoconazole (NIZORAL) 2 % cream Apply a small amount topically to skin 2 (two) times daily. 60 g 2   melatonin 5 MG TABS Take 5 mg by mouth.     metFORMIN (GLUCOPHAGE-XR) 500 MG 24 hr tablet Take 1 tablet (500 mg total) by mouth 2 (two) times daily. 180 tablet 3   Naftifine HCl 2 % CREA Apply 1 Application topically 2 (two) times daily as needed (yeast).  nitroGLYCERIN (NITROSTAT) 0.4 MG SL tablet Place 1 tablet (0.4 mg total) under the tongue as needed for chest pain, if no relief call 911. 25 tablet 5   oxyCODONE-acetaminophen (PERCOCET) 10-325 MG tablet Take 1 tablet by mouth every 6 (six) hours as needed. 60 tablet 0   pantoprazole (PROTONIX) 40 MG tablet Take 1 tablet (40 mg total) by mouth 2 (two) times daily. 180 tablet 0   polyethylene glycol (MIRALAX / GLYCOLAX) 17 g packet Take 17 g by mouth daily.     promethazine (PHENERGAN) 12.5 MG tablet Take 1 tablet (12.5 mg total) by mouth up to 2 (two) times daily as needed for nausea 30 tablet 1   ranolazine (RANEXA) 500 MG 12 hr tablet TAKE ONE TABLET BY MOUTH TWICE DAILY 180 tablet 0   TRESIBA FLEXTOUCH 100 UNIT/ML FlexTouch Pen Inject 20 Units into the skin daily.     triamcinolone cream (KENALOG) 0.1 % Apply 1 application  topically 2 (two) times daily as needed (irritation).     Vitamin D, Cholecalciferol, 25 MCG (1000 UT) CAPS Take 1 capsule by mouth daily.     No current facility-administered medications on file prior to visit.    Allergies   Allergen Reactions   Hydrocodone Nausea Only    Other reaction(s): nauseated    Methocarbamol Nausea Only    Other reaction(s): nauseated   Sertraline Hcl     Other Reaction(s): zombie   Oxycodone Nausea Only     DIAGNOSTIC DATA (LABS, IMAGING, TESTING) - I reviewed patient records, labs, notes, testing and imaging myself where available.  Lab Results  Component Value Date   WBC 3.4 (L) 05/20/2022   HGB 15.1 05/20/2022   HCT 46.1 05/20/2022   MCV 93.5 05/20/2022   PLT 127 (L) 05/20/2022      Component Value Date/Time   NA 139 05/20/2022 1100   NA 142 09/27/2021 1112   K 4.6 05/20/2022 1100   CL 102 05/20/2022 1100   CO2 27 05/20/2022 1100   GLUCOSE 247 (H) 05/20/2022 1100   BUN 22 05/20/2022 1100   BUN 26 09/27/2021 1112   CREATININE 1.07 05/20/2022 1100   CREATININE 0.92 07/22/2015 0921   CALCIUM 9.4 05/20/2022 1100   PROT 7.2 02/19/2020 0850   ALBUMIN 4.6 02/19/2020 0850   AST 17 02/19/2020 0850   ALT 24 02/19/2020 0850   ALKPHOS 88 02/19/2020 0850   BILITOT 0.5 02/19/2020 0850   GFRNONAA >60 05/20/2022 1100   GFRAA 58 (L) 02/19/2020 0850   Lab Results  Component Value Date   CHOL 105 02/19/2020   HDL 36 (L) 02/19/2020   LDLCALC 53 02/19/2020   TRIG 78 02/19/2020   CHOLHDL 2.9 02/19/2020   Lab Results  Component Value Date   HGBA1C 7.3 (H) 07/26/2019   No results found for: "VITAMINB12" Lab Results  Component Value Date   TSH 1.532 07/25/2019    PHYSICAL EXAM:  Today's Vitals   05/30/23 1528  BP: 135/76  Pulse: 89  Weight: 196 lb (88.9 kg)  Height: 5\' 9"  (1.753 m)   Body mass index is 28.94 kg/m.   Wt Readings from Last 3 Encounters:  05/30/23 196 lb (88.9 kg)  03/01/23 194 lb (88 kg)  08/03/22 196 lb 3.2 oz (89 kg)     Ht Readings from Last 3 Encounters:  05/30/23 5\' 9"  (1.753 m)  03/01/23 5\' 9"  (1.753 m)  08/03/22 5\' 9"  (1.753 m)      General: The patient is awake,  alert and appears not in acute distress. The patient is well  groomed. The patient is awake, alert and appears not in acute distress. The patient is well groomed. Head: Normocephalic, atraumatic.  Neck is supple. Mallampati 2,  neck circumference:17 inches . Nasal airflow  patent. Retrognathia is  seen.  Dental status: biological  Tremor in the tongue.  Cardiovascular:  Regular rate and cardiac rhythm by pulse,  without distended neck veins. Respiratory: Lungs are clear to auscultation.  Skin:  Without evidence of ankle edema, or rash. Trunk: The patient's posture is erect. Not stooped.    Neurologic exam : The patient is awake and alert, oriented to place and time.   Memory subjective described as impaired .  Attention span & concentration ability appears reduced. He is friendly, interactive,  Speech is fluent,  without dysarthria, but with mild dysphonia. He is now looking for words and names- aphasia.   Mood and affect are appropriate. He is very outgoing.    Cranial nerves: has no sense of smell  for years -and reduced sense of  taste reported - he  differentiates foods  by texture .    Pupils are equal and briskly reactive to light. Extraocular movements in vertical and horizontal planes were intact and without nystagmus. No Diplopia. Visual fields by finger perimetry are intact. Hearing was intact to soft voice and finger rubbing.    Facial sensation intact to fine touch.  Facial motor strength is symmetric and tongue and uvula move midline.  Neck ROM : rotation, tilt and flexion extension were normal for age and shoulder shrug was symmetrical.    Motor exam:  Symmetric bulk, and ROM. Strength of hip flexion, adduction, abduction, knee extension is intact  pronator drift left, shoulder ROM restricted in both sids.  Increased tone on right biceps with rigor. Cogwheeling on both sides!  symmetric grip strength .   Sensory:  Fine touch and vibration were  normal.  Coordination: Rapid alternating movements in the fingers/hands were of normal  speed.  The Finger-to-nose maneuver was intact with evidence of action tremor, not resting tremor .     Gait and station: Patient could rise unassisted from a seated position, walked without assistive device.  Stance is of normal width/ base and the patient turned with 3 steps.  Toe and heel walk were deferred.  Deep tendon reflexes: in the  upper and lower extremities are symmetric and intact. Brisk in upper extremities.  Babinski response was deferred.      ASSESSMENT AND PLAN 78 y.o. year old male  here with: unclear origin of some  neurocognitive decline , slow and  subtile.   Tremor appears now to be essential but the patient has reported some myoclonic jerking, throwing  the contents of a cup of coffee accidentially last morning, no cogwheeling. No masked face. No stooped gait.      1) anosmia and ageueia for years-  can be early markers of neuro-degenerative diseases.  Has now constipation, chronic. REM BD present.   2) need complete AD panel - amyloid ratio and AD risk by alpha-lipoprotein E classification.  If positive, will follow with a PET amyloid scan.   3)  recovering from back surgery and less in pain, and more mobile and limber than last year, affecting his Epworth and fatigue scores.   4)  referral to neuropsychology.    I plan to follow up either personally or through our NP within 4- 6  months.   I would  like to thank  Aldo Hun, Md [address] [place],  [place] [address] for allowing me to meet with and to take care of this pleasant patient.   CC: I will share my notes with Dr Genelle Kennedy, Dr powers, .  After spending a total time of  35  minutes face to face and additional time for physical and neurologic examination, review of laboratory studies,  personal review of imaging studies, reports and results of other testing and review of referral information / records as far as provided in visit,   Electronically signed by: Neomia Banner, MD 05/30/2023 3:59  PM  [facility] Board certified by The [employer] of Sleep Medicine and Diplomate of the [employer] of Sleep Medicine. Board certified In Neurology through the ABPN, Fellow of the [employer] of Neu[employer].

## 2023-05-30 NOTE — Patient Instructions (Signed)
 Problems With Thinking and Memory (Mild Neurocognitive Disorder): What to Know Mild neurocognitive disorder, formerly known as mild cognitive impairment, is a disorder where your memory doesn't work as well as it should. It may also affect other mental abilities like thinking, communicating, behavior, and being able to finish tasks. These problems can be noticed and measured. But they usually don't stop you from doing daily activities or living on your own. Mild neurocognitive disorder usually happens after 77 years of age. But it can also happen at younger ages. It's not as serious as major neurocognitive disorder, also known as dementia, but it may be the first sign of it. In general, the symptoms of this condition get worse over time. In rare cases, symptoms can get better. What are the causes? This condition may be caused by: Brain disorders like Alzheimer's disease, Parkinson's disease, and other conditions that slowly damage nerve cells. Diseases that affect the blood vessels in the brain and cause small strokes. Certain infections, like HIV. Traumatic brain injury. Other medical conditions, such as brain tumors, underactive thyroid (hypothyroidism), and not having enough vitamin B12. Using certain drugs or medicines. What increases the risk? Being older than 77 years of age. Being male. Having a lower level of education. Diabetes, high blood pressure, high cholesterol, and other conditions that raise the risk for blood vessel diseases. Untreated or undertreated sleep apnea. Having a certain type of gene that can be inherited, or passed down from parent to child. Long-term health problems like heart disease, lung disease, liver disease, kidney disease, or depression. What are the signs or symptoms? Trouble remembering things. You may: Forget names, phone numbers, or details of recent events. Forget about social events and appointments. Often forget where you put your car keys or other  items. Trouble thinking and solving problems. You may have trouble with complex tasks like: Paying bills. Driving in places you don't know well. Trouble communicating. You may have trouble: Finding the right word or naming an object. Forming a sentence that makes sense. Understanding what you read or hear. Changes in your behavior or personality. When this happens, you may: Lose interest in the things you used to enjoy. Avoid being around people. Get angry more easily than usual. Act before thinking. How is this diagnosed? This condition is diagnosed based on: Your symptoms. Your health care provider may ask you and the people you spend time with, like family and friends, about your symptoms. Memory tests and other tests to check how your brain is working. Your provider may refer you to a provider called a neurologist or a mental health specialist. To try to find out the cause of your condition, your provider may: Get a detailed medical history. Ask about use of alcohol, drugs, and medicines. Do a physical exam. Order blood tests and brain imaging tests. How is this treated? Mild neurocognitive disorder that's caused by medicine use, drug use, infection, or another medical condition may get better when the cause is treated, or when medicines or drugs are stopped. If this disorder has another cause, it usually doesn't improve and may get worse. In these cases, the goal of treatment is to help you manage the symptoms. This may include: Medicines to help with memory and behavior symptoms. Talk therapy. This provides education, emotional support, memory aids, and other ways of making up for problems with mental tasks. Lifestyle changes. These may include: Getting regular exercise. Eating a healthy diet that includes omega-3 fatty acids. Doing things to challenge your thinking  and memory skills. Spending more time being with and talking to other people. Using routines like having regular  times for meals and going to bed. Follow these instructions at home: Eating and drinking  Drink more fluids as told. Eat a healthy diet that includes omega-3 fatty acids. These can be found in: Fish. Nuts. Leafy vegetables. Vegetable oils. If you drink alcohol: Limit how much you have to: 0-1 drink a day if you're male. 0-2 drinks a day if you're male. Know how much alcohol is in your drink. In the [place] one drink is one 12 oz bottle of beer (355 mL), one 5 oz glass of wine (148 mL), or one 1 oz glass of hard liquor (44 mL). Lifestyle  Get regular exercise as told by your provider. Do not smoke, vape, or use nicotine or tobacco. Use healthy ways to manage stress. If you need help managing stress, ask your provider. Keep spending time with other people. Keep your mind active by doing activities you enjoy, like reading or playing games. Make sure you get good sleep at night. These tips can help: Try not to take naps during the day. Keep your bedroom dark and cool. Do not exercise in the few hours before you go to bed. Do not have foods or drinks with caffeine at night. General instructions Take medicines only as told. Your provider may tell you to avoid taking medicines that can affect thinking. These include some medicines for pain or sleeping. Work with your provider to find out: What things you need help with. What your safety needs are. Where to find more information [employer] on Aging: [email or web address] Contact a health care provider if: You have any new symptoms. Get help right away if: You have new confusion or your confusion gets worse. You act in ways that put you or your family in danger. This information is not intended to replace advice given to you by your health care provider. Make sure you discuss any questions you have with your health care provider. Document Revised: 07/26/2022 Document Reviewed: 07/26/2022 Elsevier Patient Education  2024 [employer].

## 2023-06-05 ENCOUNTER — Other Ambulatory Visit (HOSPITAL_COMMUNITY): Payer: Self-pay

## 2023-06-06 ENCOUNTER — Other Ambulatory Visit (HOSPITAL_COMMUNITY): Payer: Self-pay

## 2023-06-06 MED ORDER — ALPRAZOLAM 0.5 MG PO TABS
0.5000 mg | ORAL_TABLET | Freq: Two times a day (BID) | ORAL | 0 refills | Status: DC | PRN
  Filled 2023-06-06: qty 60, 30d supply, fill #0

## 2023-06-08 ENCOUNTER — Encounter: Payer: Self-pay | Admitting: Neurology

## 2023-06-08 ENCOUNTER — Other Ambulatory Visit (HOSPITAL_COMMUNITY): Payer: Self-pay

## 2023-06-08 LAB — APOE ALZHEIMER'S RISK

## 2023-06-08 LAB — ATN PROFILE
A -- Beta-amyloid 42/40 Ratio: 0.114 (ref >0.102)
Beta-amyloid 40: 253.57 pg/mL
Beta-amyloid 42: 28.79 pg/mL
N -- NfL, Plasma: 8.64 pg/mL — ABNORMAL HIGH (ref 0.00–7.64)
T -- p-tau181: 1.37 pg/mL — ABNORMAL HIGH (ref 0.00–0.97)

## 2023-06-15 ENCOUNTER — Other Ambulatory Visit: Payer: Self-pay | Admitting: Cardiovascular Disease

## 2023-06-15 ENCOUNTER — Other Ambulatory Visit (HOSPITAL_COMMUNITY): Payer: Self-pay

## 2023-06-15 NOTE — Telephone Encounter (Signed)
-----   Message from Hermitage Dohmeier sent at 06/08/2023 12:11 PM EDT ----- 2 bio markers for neuro-degenerative disease are present but not consistent with AD as amyloid index was normal. Negative test for genetic AD  predisposition by APOE 3/3 finding.  This finding can be seen in patients with vascular brain disease , fronto temporal, Lewy body and TBI memory disorders.  Reviewed the metabolic PET brain scan again, this was negative  for frontal lobe abnormalities.  Treatment with Aricept , namenda but not a candidte for AD specific MAB therapy

## 2023-06-15 NOTE — Telephone Encounter (Signed)
 Called the patient and reviewed the blood work with him. He states that he is on a medication that Dr Perini prescribed for his brain. He doesn't remember the name of the medication. He asked me to call his wife and ask her.   I attempted to call the wife and there was no answer. LVM asking for a call back. Lab results were reviewed with pt but if the wife could provide us  the name of the medication he is taking for his memory we can make sure we have it on file.

## 2023-06-16 ENCOUNTER — Encounter (HOSPITAL_COMMUNITY): Payer: Self-pay

## 2023-06-16 ENCOUNTER — Other Ambulatory Visit: Payer: Self-pay

## 2023-06-16 ENCOUNTER — Other Ambulatory Visit (HOSPITAL_COMMUNITY): Payer: Self-pay

## 2023-06-16 MED ORDER — RANOLAZINE ER 500 MG PO TB12
500.0000 mg | ORAL_TABLET | Freq: Two times a day (BID) | ORAL | 3 refills | Status: AC
  Filled 2023-06-16: qty 180, 90d supply, fill #0
  Filled 2023-09-18: qty 180, 90d supply, fill #1
  Filled 2023-12-14: qty 180, 90d supply, fill #2
  Filled 2024-03-21: qty 180, 90d supply, fill #3

## 2023-06-19 ENCOUNTER — Other Ambulatory Visit (HOSPITAL_COMMUNITY): Payer: Self-pay

## 2023-06-21 ENCOUNTER — Encounter: Payer: Self-pay | Admitting: Podiatry

## 2023-06-21 ENCOUNTER — Ambulatory Visit: Admitting: Podiatry

## 2023-06-21 DIAGNOSIS — E1142 Type 2 diabetes mellitus with diabetic polyneuropathy: Secondary | ICD-10-CM

## 2023-06-21 DIAGNOSIS — M79676 Pain in unspecified toe(s): Secondary | ICD-10-CM | POA: Diagnosis not present

## 2023-06-21 DIAGNOSIS — B351 Tinea unguium: Secondary | ICD-10-CM

## 2023-06-21 NOTE — Progress Notes (Signed)
   Subjective:  Patient ID: Hunter Hering., male    DOB: 1945-12-13,   MRN: [id number]  No chief complaint on file.   78 y.o. male presents for concern of thickened elongated and painful nails that are difficult to trim. Requesting to have them trimmed today. Relates burning and tingling in their feet. Patient is diabetic and last A1c was  Lab Results  Component Value Date   HGBA1C 7.3 (H) 07/26/2019   .   PCP:  Rodrigo Ran, MD    . Denies any other pedal complaints. Denies n/v/f/c.   Past Medical History:  Diagnosis Date   Arthritis    "lower back; right shoulder; left thumb; joints" (06/21/2013)   Asthma    "not sure if this is true or not" (06/21/2013)   CAD (coronary artery disease) 06/2013   s/p PTCA/DES x 3 to mid LAD 2015. b. s/p CABG in 2018. c. cath 12/2017 -> med rx.   Dementia (HCC) 2023   per pt's wife   GERD (gastroesophageal reflux disease)    Gout    "maybe twice in my life"   Headache(784.0)    "weekly for the last 3-4 months" (06/21/2013)   Hyperlipidemia    Hypertension    Myocardial infarction (HCC)    "/Dr. Alan Mulder had one between 2014-2015) (06/21/2013)   Neuromuscular disorder (HCC)    Osteoarthritis    Skin cancer; face 2023   Sleep apnea    WEIGHT LOSS, NO LONGER NEEDS PER PATIENT   Type II diabetes mellitus (HCC)    TYPE 2    Objective:  Physical Exam: Vascular: DP/PT pulses 2/4 bilateral. CFT <3 seconds. Normal hair growth on digits. No edema.  Skin. No lacerations or abrasions bilateral feet. Left hallux nail bed healing well. Nails 1-5 bilateral are thickened elongated and with subungual debris.  Musculoskeletal: MMT 5/5 bilateral lower extremities in DF, PF, Inversion and Eversion. Deceased ROM in DF of ankle joint. No pain to palpation of the foot.  Neurological: Sensation intact to light touch. Protective sensation intact b/l.   Assessment:   1. Pain due to onychomycosis of toenail   2. Diabetic peripheral neuropathy associated with  type 2 diabetes mellitus (HCC)        Plan:  Patient was evaluated and treated and all questions answered. -Discussed and educated patient on diabetic foot care, especially with  regards to the vascular, neurological and musculoskeletal systems.  -Stressed the importance of good glycemic control and the detriment of not  controlling glucose levels in relation to the foot. -Discussed supportive shoes at all times and checking feet regularly.  -Mechanically debrided all nails 1-5 bilateral using sterile nail nipper and filed with dremel without incident  -Answered all patient questions -Patient to return  in 3 months for at risk foot care -Patient advised to call the office if any problems or questions arise in the meantime.    Louann Sjogren, DPM

## 2023-06-21 NOTE — Telephone Encounter (Signed)
 Pts wife called back confirming the patient is on galantamine  ER 8 mg for memory medication and tolerating that well. He had previously tried aricept and didn't tolerate that well. They are happy on the current treatment plan and had no further questions

## 2023-07-04 DIAGNOSIS — I25119 Atherosclerotic heart disease of native coronary artery with unspecified angina pectoris: Secondary | ICD-10-CM | POA: Diagnosis not present

## 2023-07-04 DIAGNOSIS — Z794 Long term (current) use of insulin: Secondary | ICD-10-CM | POA: Diagnosis not present

## 2023-07-04 DIAGNOSIS — E1169 Type 2 diabetes mellitus with other specified complication: Secondary | ICD-10-CM | POA: Diagnosis not present

## 2023-07-04 DIAGNOSIS — I10 Essential (primary) hypertension: Secondary | ICD-10-CM | POA: Diagnosis not present

## 2023-07-07 ENCOUNTER — Other Ambulatory Visit: Payer: Self-pay | Admitting: Cardiovascular Disease

## 2023-07-07 ENCOUNTER — Other Ambulatory Visit (HOSPITAL_COMMUNITY): Payer: Self-pay

## 2023-07-07 MED ORDER — AMLODIPINE BESYLATE 5 MG PO TABS
5.0000 mg | ORAL_TABLET | Freq: Two times a day (BID) | ORAL | 2 refills | Status: AC
  Filled 2023-07-07: qty 180, 90d supply, fill #0
  Filled 2023-10-06: qty 180, 90d supply, fill #1
  Filled 2023-12-09 – 2023-12-13 (×2): qty 180, 90d supply, fill #2

## 2023-07-07 MED ORDER — CLOPIDOGREL BISULFATE 75 MG PO TABS
75.0000 mg | ORAL_TABLET | Freq: Every day | ORAL | 3 refills | Status: AC
  Filled 2023-07-07: qty 90, 90d supply, fill #0
  Filled 2023-10-06: qty 90, 90d supply, fill #1
  Filled 2024-01-02: qty 90, 90d supply, fill #2

## 2023-07-07 MED FILL — Amlodipine Besylate Tab 5 MG (Base Equivalent): ORAL | 90 days supply | Qty: 180 | Fill #0 | Status: CN

## 2023-07-07 MED FILL — Clopidogrel Bisulfate Tab 75 MG (Base Equiv): ORAL | 90 days supply | Qty: 90 | Fill #0 | Status: CN

## 2023-07-11 ENCOUNTER — Other Ambulatory Visit (HOSPITAL_COMMUNITY): Payer: Self-pay

## 2023-07-12 ENCOUNTER — Other Ambulatory Visit (HOSPITAL_COMMUNITY): Payer: Self-pay

## 2023-07-13 ENCOUNTER — Other Ambulatory Visit (HOSPITAL_COMMUNITY): Payer: Self-pay

## 2023-07-13 MED ORDER — ALPRAZOLAM 0.5 MG PO TABS
0.5000 mg | ORAL_TABLET | Freq: Two times a day (BID) | ORAL | 0 refills | Status: DC | PRN
  Filled 2023-07-13: qty 60, 30d supply, fill #0

## 2023-07-14 ENCOUNTER — Other Ambulatory Visit (HOSPITAL_COMMUNITY): Payer: Self-pay

## 2023-07-14 DIAGNOSIS — G309 Alzheimer's disease, unspecified: Secondary | ICD-10-CM | POA: Diagnosis not present

## 2023-07-14 DIAGNOSIS — E1169 Type 2 diabetes mellitus with other specified complication: Secondary | ICD-10-CM | POA: Diagnosis not present

## 2023-07-14 DIAGNOSIS — E669 Obesity, unspecified: Secondary | ICD-10-CM | POA: Diagnosis not present

## 2023-07-14 DIAGNOSIS — I209 Angina pectoris, unspecified: Secondary | ICD-10-CM | POA: Diagnosis not present

## 2023-07-14 DIAGNOSIS — J45909 Unspecified asthma, uncomplicated: Secondary | ICD-10-CM | POA: Diagnosis not present

## 2023-07-14 DIAGNOSIS — E785 Hyperlipidemia, unspecified: Secondary | ICD-10-CM | POA: Diagnosis not present

## 2023-07-14 DIAGNOSIS — D7589 Other specified diseases of blood and blood-forming organs: Secondary | ICD-10-CM | POA: Diagnosis not present

## 2023-07-14 DIAGNOSIS — F418 Other specified anxiety disorders: Secondary | ICD-10-CM | POA: Diagnosis not present

## 2023-07-14 DIAGNOSIS — I2510 Atherosclerotic heart disease of native coronary artery without angina pectoris: Secondary | ICD-10-CM | POA: Diagnosis not present

## 2023-07-14 DIAGNOSIS — Z794 Long term (current) use of insulin: Secondary | ICD-10-CM | POA: Diagnosis not present

## 2023-07-14 DIAGNOSIS — I10 Essential (primary) hypertension: Secondary | ICD-10-CM | POA: Diagnosis not present

## 2023-07-14 DIAGNOSIS — F0280 Dementia in other diseases classified elsewhere without behavioral disturbance: Secondary | ICD-10-CM | POA: Diagnosis not present

## 2023-07-14 DIAGNOSIS — E611 Iron deficiency: Secondary | ICD-10-CM | POA: Diagnosis not present

## 2023-07-14 DIAGNOSIS — I25119 Atherosclerotic heart disease of native coronary artery with unspecified angina pectoris: Secondary | ICD-10-CM | POA: Diagnosis not present

## 2023-07-31 ENCOUNTER — Other Ambulatory Visit (HOSPITAL_COMMUNITY): Payer: Self-pay

## 2023-08-02 ENCOUNTER — Other Ambulatory Visit (HOSPITAL_COMMUNITY): Payer: Self-pay

## 2023-08-02 ENCOUNTER — Encounter (HOSPITAL_COMMUNITY): Payer: Self-pay

## 2023-08-08 ENCOUNTER — Other Ambulatory Visit (HOSPITAL_COMMUNITY): Payer: Self-pay

## 2023-08-08 MED ORDER — PANTOPRAZOLE SODIUM 40 MG PO TBEC
40.0000 mg | DELAYED_RELEASE_TABLET | Freq: Two times a day (BID) | ORAL | 0 refills | Status: DC
  Filled 2023-08-08: qty 180, 90d supply, fill #0

## 2023-08-10 ENCOUNTER — Encounter (HOSPITAL_COMMUNITY): Payer: Self-pay

## 2023-08-10 ENCOUNTER — Other Ambulatory Visit (HOSPITAL_COMMUNITY): Payer: Self-pay

## 2023-08-10 MED ORDER — ALPRAZOLAM 0.5 MG PO TABS
0.5000 mg | ORAL_TABLET | Freq: Two times a day (BID) | ORAL | 0 refills | Status: DC | PRN
  Filled 2023-08-11 – 2023-08-14 (×2): qty 60, 30d supply, fill #0

## 2023-08-11 ENCOUNTER — Other Ambulatory Visit (HOSPITAL_COMMUNITY): Payer: Self-pay

## 2023-08-14 ENCOUNTER — Other Ambulatory Visit (HOSPITAL_COMMUNITY): Payer: Self-pay

## 2023-08-15 ENCOUNTER — Other Ambulatory Visit: Payer: Self-pay

## 2023-08-15 ENCOUNTER — Other Ambulatory Visit (HOSPITAL_COMMUNITY): Payer: Self-pay

## 2023-08-25 ENCOUNTER — Other Ambulatory Visit (HOSPITAL_COMMUNITY): Payer: Self-pay

## 2023-08-25 ENCOUNTER — Ambulatory Visit: Attending: Cardiology | Admitting: Cardiology

## 2023-08-25 VITALS — BP 120/80 | HR 63 | Ht 69.0 in | Wt 196.8 lb

## 2023-08-25 DIAGNOSIS — E785 Hyperlipidemia, unspecified: Secondary | ICD-10-CM

## 2023-08-25 DIAGNOSIS — I10 Essential (primary) hypertension: Secondary | ICD-10-CM | POA: Diagnosis not present

## 2023-08-25 DIAGNOSIS — R079 Chest pain, unspecified: Secondary | ICD-10-CM

## 2023-08-25 DIAGNOSIS — I2510 Atherosclerotic heart disease of native coronary artery without angina pectoris: Secondary | ICD-10-CM | POA: Diagnosis not present

## 2023-08-25 MED ORDER — ISOSORBIDE MONONITRATE ER 60 MG PO TB24
60.0000 mg | ORAL_TABLET | Freq: Every day | ORAL | 3 refills | Status: AC
  Filled 2023-08-25: qty 90, 90d supply, fill #0
  Filled 2023-11-13: qty 90, 90d supply, fill #1
  Filled 2024-02-18: qty 90, 90d supply, fill #2

## 2023-08-25 NOTE — Patient Instructions (Addendum)
 Medication Instructions:  Your physician has recommended you make the following change in your medication:   INCREASE Imdur  to 60 mg taking 1 daily  *If you need a refill on your cardiac medications before your next appointment, please call your pharmacy*  Lab Work: None ordered  If you have labs (blood work) drawn today and your tests are completely normal, you will receive your results only by: MyChart Message (if you have MyChart) OR A paper copy in the mail If you have any lab test that is abnormal or we need to change your treatment, we will call you to review the results.  Testing/Procedures:    Please report to Radiology at the [facility] Main Entrance 30 minutes early for your test.  [address] [place], [place] [address]                         OR   Please report to Radiology at [facility] Main Entrance, medical mall, 30 mins prior to your test.  [address]  [place], [place]  How to Prepare for Your Cardiac PET/CT Stress Test:  Nothing to eat or drink, except water , 3 hours prior to arrival time.  NO caffeine/decaffeinated products, or chocolate 12 hours prior to arrival. (Please note decaffeinated beverages (teas/coffees) still contain caffeine).  If you have caffeine within 12 hours prior, the test will need to be rescheduled.  Medication instructions: Do not take erectile dysfunction medications for 72 hours prior to test (sildenafil, tadalafil) Do not take isosorbide  mononitrate IMDUR  OR RANEXA  the day before or day of test Do not take tamsulosin the day before or morning of test Hold theophylline containing medications for 12 hours. Hold Dipyridamole 48 hours prior to the test.  Diabetic Preparation: If able to eat breakfast prior to 3 hour fasting, you may take all medications, including your insulin . Do not worry if you miss your breakfast dose of insulin  - start at your next meal. If you do not eat prior to 3  hour fast-Hold all diabetes (oral and insulin ) medications. Patients who wear a continuous glucose monitor MUST remove the device prior to scanning.  You may take your remaining medications with water .  NO perfume, cologne or lotion on chest or abdomen area. FEMALES - Please avoid wearing dresses to this appointment.  Total time is 1 to 2 hours; you may want to bring reading material for the waiting time.  IF YOU THINK YOU MAY BE PREGNANT, OR ARE NURSING PLEASE INFORM THE TECHNOLOGIST.  In preparation for your appointment, medication and supplies will be purchased.  Appointment availability is limited, so if you need to cancel or reschedule, please call the Radiology Department Scheduler at [phone] 24 hours in advance to avoid a cancellation fee of $100.00  What to Expect When you Arrive:  Once you arrive and check in for your appointment, you will be taken to a preparation room within the Radiology Department.  A technologist or Nurse will obtain your medical history, verify that you are correctly prepped for the exam, and explain the procedure.  Afterwards, an IV will be started in your arm and electrodes will be placed on your skin for EKG monitoring during the stress portion of the exam. Then you will be escorted to the PET/CT scanner.  There, staff will get you positioned on the scanner and obtain a blood pressure and EKG.  During the exam, you will continue to be  connected to the EKG and blood pressure machines.  A small, safe amount of a radioactive tracer will be injected in your IV to obtain a series of pictures of your heart along with an injection of a stress agent.    After your Exam:  It is recommended that you eat a meal and drink a caffeinated beverage to counter act any effects of the stress agent.  Drink plenty of fluids for the remainder of the day and urinate frequently for the first couple of hours after the exam.  Your doctor will inform you of your test results within  7-10 business days.  For more information and frequently asked questions, please visit our website: [email or web address]  For questions about your test or how to prepare for your test, please call: Cardiac Imaging Nurse Navigators Office: [phone]   Follow-Up: At [facility], you and your health needs are our priority.  As part of our continuing mission to provide you with exceptional heart care, our providers are all part of one team.  This team includes your primary Cardiologist (physician) and Advanced Practice Providers or APPs (Physician Assistants and Nurse Practitioners) who all work together to provide you with the care you need, when you need it.  Your next appointment:   3 month(s)  Provider:   Hao Meng, PA-C          We recommend signing up for the patient portal called MyChart.  Sign up information is provided on this After Visit Summary.  MyChart is used to connect with patients for Virtual Visits (Telemedicine).  Patients are able to view lab/test results, encounter notes, upcoming appointments, etc.  Non-urgent messages can be sent to your provider as well.   To learn more about what you can do with MyChart, go to [email or web address].   Other Instructions

## 2023-08-25 NOTE — Progress Notes (Unsigned)
 Cardiology Office Note:   Date:  08/29/2023  ID:  Hunter JONETTA Darra Mickey., DOB 11/16/1945, MRN [id number] PCP: [name], MD  [facility] Providers Cardiologist:  Aleene Passe, MD    History of Present Illness:   Discussed the use of AI scribe software for clinical note transcription with the patient, who gave verbal consent to proceed.  History of Present Illness Hunter JONETTA Darra Mickey. Hunter Mcbride is a 78 year old male with coronary artery disease and prior bypass surgery who presents for follow up. He also reports intermittent back pain and left arm ache. He is accompanied by his wife.  He experiences intermittent back pain described as a 'hot little knife' sensation in a specific spot, primarily occurring after physical activity but also occasionally when resting. In 2021, he underwent heart catheterization which showed patent grafts but progression of native coronary artery disease. At that time, he had chest pain radiating down his left arm with a dull ache and back pain similar to what he reports today.  He describes occasional dull aches under his left arm, associated with physical exertion such as using clippers for yard work. This ache is not consistent and does not occur with all physical activities. He notes that certain beverages, like [facility] or Powerade, sometimes cause a couple of pains in his chest, which he suspects might be gastric in nature.  No associated symptoms such as shortness of breath, dizziness, or lightheadedness with these pains. He is familiar with heart-related pain and states that these current symptoms do not feel exactly like his previous heart symptoms (which again he reiterates were atypical).  His current medications include Imdur , Ranexa , irbesartan , atenolol , Lipitor , Plavix , amlodipine , and hydrochlorothiazide .    Studies Reviewed:    EKG:   EKG Interpretation Date/Time:  Friday August 25 2023 15:19:42 EDT Ventricular Rate:  63 PR  Interval:  212 QRS Duration:  96 QT Interval:  406 QTC Calculation: 415 R Axis:   -2  Text Interpretation: Sinus rhythm with 1st degree A-V block Inferior infarct , age undetermined When compared with ECG of 03-Aug-2022 09:40, No significant change was found Confirmed by Trudy Birmingham [phone]) on 08/25/2023 3:23:58 PM      Risk Assessment/Calculations:         Physical Exam:   VS:  BP 120/80   Pulse 63   Ht 5' 9 (1.753 m)   Wt 196 lb 12.8 oz (89.3 kg)   SpO2 97%   BMI 29.06 kg/m    Wt Readings from Last 3 Encounters:  08/25/23 196 lb 12.8 oz (89.3 kg)  05/30/23 196 lb (88.9 kg)  03/01/23 194 lb (88 kg)     Physical Exam Vitals reviewed.  Constitutional:      Appearance: Normal appearance.  HENT:     Head: Normocephalic.  Eyes:     Pupils: Pupils are equal, round, and reactive to light.  Cardiovascular:     Rate and Rhythm: Normal rate and regular rhythm.     Pulses: Normal pulses.     Heart sounds: Normal heart sounds.  Pulmonary:     Effort: Pulmonary effort is normal.     Breath sounds: Normal breath sounds.  Abdominal:     General: Abdomen is flat.     Palpations: Abdomen is soft.  Musculoskeletal:     Right lower leg: No edema.     Left lower leg: No edema.  Skin:    General: Skin is warm and dry.  Capillary Refill: Capillary refill takes less than 2 seconds.  Neurological:     General: No focal deficit present.     Mental Status: He is alert and oriented to person, place, and time.  Psychiatric:        Mood and Affect: Mood normal.        Behavior: Behavior normal.        Thought Content: Thought content normal.        Judgment: Judgment normal.     ASSESSMENT AND PLAN:    Assessment & Plan Coronary artery disease s/p CABG with patent grafts Intermittent back pain and dull ache under the left arm, possibly related to coronary artery disease, may indicate changes in cardiac status. Previous catheterization in 2021 showed patent grafts but  progression of native coronary artery disease. Differential includes microvascular disease, which could be confirmed with a cardiac PET stress test. - Increase Imdur  to 60 mg daily, with potential side effect of headache - Continue Ranexa  500 mg twice daily - Continue Atenolol  50mg  daily - Continue Plavix /ASA daily - Schedule cardiac PET stress test - Monitor blood pressure to ensure it does not drop too low with medication change - Instruct to report any significant worsening of symptoms  Hypertension Blood pressure appears excellent.  - Monitor blood pressure regularly with Imdur  dose increase - Otherwise, continue current antihypertensive regimen  Hyperlipidemia Cholesterol levels are well-controlled with current regimen. LDL is optimized at 47, below the target of 55 for patients with CABG history. - Continue Atorvastatin  80mg   Follow up in 3 months to reassess symptoms/discuss PET stress test.     Informed Consent   Shared Decision Making/Informed Consent The risks [chest pain, shortness of breath, cardiac arrhythmias, dizziness, blood pressure fluctuations, myocardial infarction, stroke/transient ischemic attack, nausea, vomiting, allergic reaction, radiation exposure, metallic taste sensation and life-threatening complications (estimated to be 1 in 10,000)], benefits (risk stratification, diagnosing coronary artery disease, treatment guidance) and alternatives of a cardiac PET stress test were discussed in detail with Hunter Mcbride and he agrees to proceed.      Signed, [name], PA-C

## 2023-09-08 ENCOUNTER — Other Ambulatory Visit: Payer: Self-pay

## 2023-09-08 ENCOUNTER — Other Ambulatory Visit (HOSPITAL_COMMUNITY): Payer: Self-pay

## 2023-09-08 MED ORDER — ALPRAZOLAM 0.5 MG PO TABS
0.5000 mg | ORAL_TABLET | Freq: Two times a day (BID) | ORAL | 0 refills | Status: DC
  Filled 2023-09-08 – 2023-09-12 (×2): qty 60, 30d supply, fill #0

## 2023-09-11 ENCOUNTER — Other Ambulatory Visit (HOSPITAL_COMMUNITY): Payer: Self-pay

## 2023-09-12 ENCOUNTER — Other Ambulatory Visit (HOSPITAL_COMMUNITY): Payer: Self-pay

## 2023-09-18 ENCOUNTER — Other Ambulatory Visit (HOSPITAL_COMMUNITY): Payer: Self-pay

## 2023-09-19 ENCOUNTER — Other Ambulatory Visit: Payer: Self-pay | Admitting: Surgery

## 2023-09-19 DIAGNOSIS — M4726 Other spondylosis with radiculopathy, lumbar region: Secondary | ICD-10-CM

## 2023-09-25 ENCOUNTER — Ambulatory Visit: Admitting: Podiatry

## 2023-09-27 ENCOUNTER — Ambulatory Visit: Admitting: Podiatry

## 2023-09-27 ENCOUNTER — Encounter: Payer: Self-pay | Admitting: Podiatry

## 2023-09-27 DIAGNOSIS — B351 Tinea unguium: Secondary | ICD-10-CM | POA: Diagnosis not present

## 2023-09-27 DIAGNOSIS — E1142 Type 2 diabetes mellitus with diabetic polyneuropathy: Secondary | ICD-10-CM | POA: Diagnosis not present

## 2023-09-27 DIAGNOSIS — M79676 Pain in unspecified toe(s): Secondary | ICD-10-CM

## 2023-09-27 NOTE — Progress Notes (Signed)
  Subjective:  Patient ID: Hunter JONETTA Darra Mickey., male    DOB: August 31, 1945,   MRN: [id number]  Chief Complaint  Patient presents with   Diabetes    Cut my toenails.  Saw Dr. Shayne - 07/04/2023; A1c - 7.2?    78 y.o. male presents for concern of thickened elongated and painful nails that are difficult to trim. Requesting to have them trimmed today. Relates burning and tingling in their feet. Patient is diabetic and last A1c was  Lab Results  Component Value Date   HGBA1C 7.3 (H) 07/26/2019   .   PCP:  [name], MD    . Denies any other pedal complaints. Denies n/v/f/c.   Past Medical History:  Diagnosis Date   Arthritis    lower back; right shoulder; left thumb; joints (06/21/2013)   Asthma    not sure if this is true or not (06/21/2013)   CAD (coronary artery disease) 06/2013   s/p PTCA/DES x 3 to mid LAD 2015. b. s/p CABG in 2018. c. cath 12/2017 -> med rx.   Dementia (HCC) 2023   per pt's wife   GERD (gastroesophageal reflux disease)    Gout    maybe twice in my life   Headache(784.0)    weekly for the last 3-4 months (06/21/2013)   Hyperlipidemia    Hypertension    Myocardial infarction (HCC)    /Dr. [name] Bouillon had one between 2014-2015) (06/21/2013)   Neuromuscular disorder (HCC)    Osteoarthritis    Skin cancer; face 2023   Sleep apnea    WEIGHT LOSS, NO LONGER NEEDS PER PATIENT   Type II diabetes mellitus (HCC)    TYPE 2    Objective:  Physical Exam: Vascular: DP/PT pulses 2/4 bilateral. CFT <3 seconds. Absent hair growth on digits. No edema. Xerosis present.  Skin. No lacerations or abrasions bilateral feet. Left hallux nail bed healing well. Nails 1-5 bilateral are thickened elongated and with subungual debris.  Musculoskeletal: MMT 5/5 bilateral lower extremities in DF, PF, Inversion and Eversion. Deceased ROM in DF of ankle joint. No pain to palpation of the foot.  Neurological: Sensation intact to light touch. Protective sensation intact b/l.    Assessment:   1. Pain due to onychomycosis of toenail   2. Diabetic peripheral neuropathy associated with type 2 diabetes mellitus (HCC)        Plan:  Patient was evaluated and treated and all questions answered. -Discussed and educated patient on diabetic foot care, especially with  regards to the vascular, neurological and musculoskeletal systems.  -Stressed the importance of good glycemic control and the detriment of not  controlling glucose levels in relation to the foot. -Discussed supportive shoes at all times and checking feet regularly.  -Mechanically debrided all nails 1-5 bilateral using sterile nail nipper and filed with dremel without incident  -Answered all patient questions -Patient to return  in 3 months for at risk foot care -Patient advised to call the office if any problems or questions arise in the meantime.    Asberry Failing, DPM

## 2023-10-10 ENCOUNTER — Other Ambulatory Visit (HOSPITAL_COMMUNITY): Payer: Self-pay

## 2023-10-12 ENCOUNTER — Encounter (HOSPITAL_COMMUNITY): Payer: Self-pay

## 2023-10-12 ENCOUNTER — Other Ambulatory Visit (HOSPITAL_COMMUNITY): Payer: Self-pay

## 2023-10-12 MED ORDER — ALPRAZOLAM 0.5 MG PO TABS
0.5000 mg | ORAL_TABLET | Freq: Two times a day (BID) | ORAL | 1 refills | Status: DC | PRN
  Filled 2023-10-12: qty 60, 30d supply, fill #0
  Filled 2023-11-09: qty 60, 30d supply, fill #1

## 2023-10-30 ENCOUNTER — Encounter (HOSPITAL_COMMUNITY): Payer: Self-pay

## 2023-11-01 ENCOUNTER — Ambulatory Visit (HOSPITAL_COMMUNITY)
Admission: RE | Admit: 2023-11-01 | Discharge: 2023-11-01 | Disposition: A | Discharge status: Home / Self Care | Source: Ambulatory Visit | Attending: Cardiology | Admitting: Cardiology

## 2023-11-01 DIAGNOSIS — R079 Chest pain, unspecified: Secondary | ICD-10-CM | POA: Insufficient documentation

## 2023-11-01 LAB — NM PET CT CARDIAC PERFUSION MULTI W/ABSOLUTE BLOODFLOW
LV dias vol: 86 mL (ref 62–150)
LV sys vol: 37 mL (ref 4.2–5.8)
MBFR: 2.24
Nuc Rest EF: 57 %
Nuc Stress EF: 66 %
Peak HR: 70 {beats}/min
Rest HR: 56 {beats}/min
Rest MBF: 0.62 ml/g/min
Rest Nuclear Isotope Dose: 22.8 mCi
ST Depression (mm): 0 mm
Stress MBF: 1.39 ml/g/min
Stress Nuclear Isotope Dose: 22.8 mCi

## 2023-11-01 MED ORDER — REGADENOSON 0.4 MG/5ML IV SOLN
0.4000 mg | Freq: Once | INTRAVENOUS | Status: AC
  Administered 2023-11-01: 0.4 mg via INTRAVENOUS

## 2023-11-01 MED ORDER — RUBIDIUM RB82 GENERATOR (RUBYFILL)
22.8000 | PACK | Freq: Once | INTRAVENOUS | Status: AC
Start: 2023-11-01 — End: 2023-11-01
  Administered 2023-11-01: 22.8 via INTRAVENOUS

## 2023-11-01 MED ORDER — REGADENOSON 0.4 MG/5ML IV SOLN
INTRAVENOUS | Status: AC
  Filled 2023-11-01: qty 5

## 2023-11-01 MED ORDER — RUBIDIUM RB82 GENERATOR (RUBYFILL)
22.7700 | PACK | Freq: Once | INTRAVENOUS | Status: AC
  Administered 2023-11-01: 22.77 via INTRAVENOUS

## 2023-11-02 DIAGNOSIS — I10 Essential (primary) hypertension: Secondary | ICD-10-CM | POA: Diagnosis not present

## 2023-11-02 DIAGNOSIS — E1169 Type 2 diabetes mellitus with other specified complication: Secondary | ICD-10-CM | POA: Diagnosis not present

## 2023-11-02 DIAGNOSIS — Z23 Encounter for immunization: Secondary | ICD-10-CM | POA: Diagnosis not present

## 2023-11-02 DIAGNOSIS — I25119 Atherosclerotic heart disease of native coronary artery with unspecified angina pectoris: Secondary | ICD-10-CM | POA: Diagnosis not present

## 2023-11-02 DIAGNOSIS — Z794 Long term (current) use of insulin: Secondary | ICD-10-CM | POA: Diagnosis not present

## 2023-11-03 ENCOUNTER — Ambulatory Visit: Payer: Self-pay | Admitting: Cardiology

## 2023-11-03 DIAGNOSIS — I77810 Thoracic aortic ectasia: Secondary | ICD-10-CM

## 2023-11-06 ENCOUNTER — Encounter (HOSPITAL_COMMUNITY): Payer: Self-pay

## 2023-11-06 ENCOUNTER — Other Ambulatory Visit (HOSPITAL_COMMUNITY): Payer: Self-pay

## 2023-11-07 ENCOUNTER — Other Ambulatory Visit (HOSPITAL_COMMUNITY): Payer: Self-pay

## 2023-11-07 DIAGNOSIS — M79644 Pain in right finger(s): Secondary | ICD-10-CM | POA: Diagnosis not present

## 2023-11-07 DIAGNOSIS — R229 Localized swelling, mass and lump, unspecified: Secondary | ICD-10-CM | POA: Diagnosis not present

## 2023-11-07 DIAGNOSIS — L03011 Cellulitis of right finger: Secondary | ICD-10-CM | POA: Diagnosis not present

## 2023-11-07 MED ORDER — DOXYCYCLINE HYCLATE 100 MG PO CAPS
100.0000 mg | ORAL_CAPSULE | Freq: Two times a day (BID) | ORAL | 0 refills | Status: DC
  Filled 2023-11-07: qty 20, 10d supply, fill #0

## 2023-11-09 ENCOUNTER — Other Ambulatory Visit: Payer: Self-pay

## 2023-11-09 DIAGNOSIS — M19041 Primary osteoarthritis, right hand: Secondary | ICD-10-CM | POA: Diagnosis not present

## 2023-11-09 DIAGNOSIS — L03011 Cellulitis of right finger: Secondary | ICD-10-CM | POA: Diagnosis not present

## 2023-11-09 DIAGNOSIS — S60131A Contusion of right middle finger with damage to nail, initial encounter: Secondary | ICD-10-CM | POA: Diagnosis not present

## 2023-11-13 ENCOUNTER — Other Ambulatory Visit (HOSPITAL_COMMUNITY): Payer: Self-pay

## 2023-11-13 MED ORDER — PANTOPRAZOLE SODIUM 40 MG PO TBEC
40.0000 mg | DELAYED_RELEASE_TABLET | Freq: Two times a day (BID) | ORAL | 0 refills | Status: DC
  Filled 2023-11-13: qty 180, 90d supply, fill #0

## 2023-11-16 ENCOUNTER — Other Ambulatory Visit (HOSPITAL_COMMUNITY): Payer: Self-pay

## 2023-11-16 DIAGNOSIS — Z85828 Personal history of other malignant neoplasm of skin: Secondary | ICD-10-CM | POA: Diagnosis not present

## 2023-11-16 DIAGNOSIS — B356 Tinea cruris: Secondary | ICD-10-CM | POA: Diagnosis not present

## 2023-11-16 MED ORDER — TERBINAFINE HCL 250 MG PO TABS
250.0000 mg | ORAL_TABLET | Freq: Every day | ORAL | 0 refills | Status: AC
  Filled 2023-11-16: qty 44, 44d supply, fill #0

## 2023-11-16 MED ORDER — KETOCONAZOLE 2 % EX CREA
TOPICAL_CREAM | Freq: Two times a day (BID) | CUTANEOUS | 2 refills | Status: AC
  Filled 2023-11-16: qty 60, 30d supply, fill #0
  Filled 2023-12-06 – 2023-12-14 (×3): qty 60, 30d supply, fill #1
  Filled 2024-01-24: qty 60, 30d supply, fill #2

## 2023-11-28 NOTE — Progress Notes (Unsigned)
 Cardiology Office Note:    Date:  11/29/2023   ID:  Hunter Mcbride., DOB 10-03-1945, MRN [id number]  PCP:  [name], MD   [facility] Providers Cardiologist:  [name], MD     Referring MD: [name], MD   Chief complaint: Follow-up of CAD s/p CABG  History of Present Illness:    Hunter Mcbride. is a 78 y.o. male with a hx of CAD w/ PCI/DES, CABG X5 in 2018 (LIMA-LAD, SVG-diagonal, SVG-circumflex, seq SVG-PLB and distal PDA), HTN, HLD, DM, asthma, GERD, and OSA presenting to office for follow-up of previous back pain/left arm pain as questionable anginal equivalent.  He has had prior DES x 3 to the LAD back in 2015. Several caths since. Ended up being referred for CABG x 5 in September of 2018 by EBG. He is Intolerant to higher doses of nitrates despite multiple attempts. Had not wanted to increase Ranexa  due to cost.  LHC 12/2017 revealed patent SVG-diagonal, severe OM1 stenosis with patent SVG graft, patent sequential graft to PDA/PLA, atretic LIMA-LAD, normal LVEDP.  Medical therapy recommended.  07/25/2019 patient was evaluated for chest pain X 2 weeks, on exertion, radiating to left arm, improving following nitroglycerin  administration. Repeat LHC 07/26/2019 revealed all grafts to be patent.  Progression of native disease of the RCA as well as ostial left main, LIMA was no longer atretic.  Normal LVSF with mildly elevated EDP.  Unclear that time as to the cause of worsening anginal symptoms, discussed possibly treating ostial LM with PCI to potentially improve flow to distal LCx, however it was mention the risks likely outweigh the benefit.  Imdur  was added to help with anginal symptoms, at subsequent OV follow-up patient was doing well.  Was seen in 2023 by [name], was doing well from a cardiovascular standpoint.  Discussed being worked up by neuro for REM sleep disorder as a possible precursor to Parkinson's or Lewy body dementia, was recommended to  continue follow-up with neuro.  Stable cardiovascular disease in 2024, asymptomatic.  08/25/2023 evaluated by [name], PA for intermittent back pain with left arm ache.  NM PET scan ordered.  NM PET performed 11/01/2023 showing normal LV perfusion, rest EF 57%, stress EF 66%.  Global myocardial blood flow reserve was 2.24 and was normal.  Coronary calcification not performed due to prior risk of vascularization. Study was considered normal, low risk study.  Ascending thoracic aortic aneurysm seen measuring 4.2 cm in diameter.  Annual imaging recommended by CTA or MRA.  Patient presents to the clinic today with his wife, appears stable from a cardiac standpoint.  Reports his previous back pain and left arm ache resolved following the increase in Imdur .  States he occasionally gets lightheaded with fast positional changes, but states it does not occur often. He denies chest pain, palpitations, dyspnea, orthopnea, n, v,  dark/tarry/bloody stools, hematuria, syncope, edema, weight gain.  He does report noticing some bilateral calf cramping, right worse than left, when he goes out hunting or climbing ladders, resolves with rest.  Reports his blood pressures typically well-controlled at home, averaging around 120 systolic, diastolics 60-70s.  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   Past Medical History:  Diagnosis Date   Arthritis    lower back; right shoulder; left thumb; joints (06/21/2013)   Asthma  not sure if this is true or not (06/21/2013)   CAD (coronary artery disease) 06/2013   s/p PTCA/DES x 3 to mid LAD 2015. b. s/p CABG in 2018. c. cath 12/2017 -> med rx.   Dementia (HCC) 2023   per pt's wife   GERD (gastroesophageal reflux disease)    Gout    maybe twice in my life   Headache(784.0)    weekly for the last 3-4 months  (06/21/2013)   Hyperlipidemia    Hypertension    Myocardial infarction (HCC)    /Dr. [name] I've had one between 2014-2015) (06/21/2013)   Neuromuscular disorder (HCC)    Osteoarthritis    Skin cancer; face 2023   Sleep apnea    WEIGHT LOSS, NO LONGER NEEDS PER PATIENT   Type II diabetes mellitus (HCC)    TYPE 2    Past Surgical History:  Procedure Laterality Date   CARDIAC CATHETERIZATION  1980's   once   CARDIAC CATHETERIZATION N/A 07/23/2015   Procedure: Left Heart Cath and Coronary Angiography;  Surgeon: Ozell Fell, MD;  Location: [facility] INVASIVE CV LAB;  Service: Cardiovascular;  Laterality: N/A;   CARDIOVASCULAR STRESS TEST  06/10/2008   EF 68%   CARPAL TUNNEL RELEASE Bilateral    COLONOSCOPY     CORONARY ANGIOPLASTY WITH STENT PLACEMENT  06/21/2013   3   CORONARY ARTERY BYPASS GRAFT N/A 11/07/2016   Procedure: CORONARY ARTERY BYPASS GRAFTING (CABG) x 5 (LIMA to DISTAL LAD, SVG to DIAGONAL, SVG to CIRCUMFLEX, and SVG SEQUENTIALLY to PLB and DISTAL PDA) with EVH of the RIGHT GREATER SAPHENOUS VEIN and LEFT INTERNAL MAMMARY ARTERY HARVEST;  Surgeon: Army Dallas NOVAK, MD;  Location: [facility] OR;  Service: Open Heart Surgery;  Laterality: N/A;   CORONARY PRESSURE/FFR STUDY N/A 10/28/2016   Procedure: INTRAVASCULAR PRESSURE WIRE/FFR STUDY;  Surgeon: Anner Alm ORN, MD;  Location: [facility] INVASIVE CV LAB;  Service: Cardiovascular;  Laterality: N/A;   CORONARY STENT INTERVENTION N/A 06/07/2016   Procedure: Coronary Stent Intervention;  Surgeon: Lonni JONETTA Cash, MD;  Location: [facility] INVASIVE CV LAB;  Service: Cardiovascular;  Laterality: N/A;   EXCISION MORTON'S NEUROMA Left    EYE SURGERY Left    removed film over my eye   FRACTIONAL FLOW RESERVE WIRE N/A 06/26/2013   Procedure: FRACTIONAL FLOW RESERVE WIRE;  Surgeon: Deatrice DELENA Cage, MD;  Location: [facility] CATH LAB;  Service: Cardiovascular;  Laterality: N/A;   HERNIA REPAIR Left    JOINT REPLACEMENT Left 03/2013   thumb   LEFT  HEART CATH AND CORONARY ANGIOGRAPHY N/A 06/07/2016   Procedure: Left Heart Cath and Coronary Angiography;  Surgeon: Lonni JONETTA Cash, MD;  Location: [facility] INVASIVE CV LAB;  Service: Cardiovascular;  Laterality: N/A;   LEFT HEART CATH AND CORONARY ANGIOGRAPHY N/A 10/28/2016   Procedure: LEFT HEART CATH AND CORONARY ANGIOGRAPHY;  Surgeon: Anner Alm ORN, MD;  Location: [facility] INVASIVE CV LAB;  Service: Cardiovascular;  Laterality: N/A;   LEFT HEART CATH AND CORS/GRAFTS ANGIOGRAPHY N/A 12/25/2017   Procedure: LEFT HEART CATH AND CORS/GRAFTS ANGIOGRAPHY;  Surgeon: Fell Ozell, MD;  Location: [facility] INVASIVE CV LAB;  Service: Cardiovascular;  Laterality: N/A;   LEFT HEART CATH AND CORS/GRAFTS ANGIOGRAPHY N/A 07/26/2019   Procedure: LEFT HEART CATH AND CORS/GRAFTS ANGIOGRAPHY;  Surgeon: Cage Deatrice DELENA, MD;  Location: [facility] INVASIVE CV LAB;  Service: Cardiovascular;  Laterality: N/A;   LEFT HEART CATHETERIZATION WITH CORONARY ANGIOGRAM N/A 06/21/2013   Procedure: LEFT HEART CATHETERIZATION WITH CORONARY ANGIOGRAM;  Surgeon: Lonni JONETTA  Verlin, MD;  Location: [facility] CATH LAB;  Service: Cardiovascular;  Laterality: N/A;   LEFT HEART CATHETERIZATION WITH CORONARY ANGIOGRAM N/A 06/26/2013   Procedure: LEFT HEART CATHETERIZATION WITH CORONARY ANGIOGRAM;  Surgeon: Deatrice DELENA Cage, MD;  Location: [facility] CATH LAB;  Service: Cardiovascular;  Laterality: N/A;   LUMBAR LAMINECTOMY/DECOMPRESSION MICRODISCECTOMY Left 05/30/2018   Procedure: Left  Gill L5 decompression;  Surgeon: Burnetta Aures, MD;  Location: [facility] OR;  Service: Orthopedics;  Laterality: Left;    POLYPECTOMY     TEE WITHOUT CARDIOVERSION N/A 11/07/2016   Procedure: TRANSESOPHAGEAL ECHOCARDIOGRAM (TEE);  Surgeon: Army Dallas NOVAK, MD;  Location: [facility] OR;  Service: Open Heart Surgery;  Laterality: N/A;   TONSILLECTOMY     TOTAL HIP ARTHROPLASTY Right 04/12/2016   Procedure: RIGHT TOTAL HIP ARTHROPLASTY ANTERIOR APPROACH;  Surgeon: Donnice Car, MD;   Location: [facility] ORS;  Service: Orthopedics;  Laterality: Right;  requests 70 mins   TOTAL SHOULDER ARTHROPLASTY  03/01/2012   Procedure: TOTAL SHOULDER ARTHROPLASTY;  Surgeon: Franky CHRISTELLA Pointer, MD;  Location: [facility] OR;  Service: Orthopedics;  Laterality: Right;   UMBILICAL HERNIA REPAIR      Current Medications: Current Meds  Medication Sig   ALPRAZolam  (XANAX ) 0.5 MG tablet Take 1 tablet (0.5 mg total) by mouth up to 2 (two) times daily as directed, as needed for anxiety.   ALPRAZolam  (XANAX ) 0.5 MG tablet Take 1 tablet (0.5 mg total) up to 2 (two) times daily as needed for Anxiety   amLODipine  (NORVASC ) 5 MG tablet Take 1 tablet (5 mg total) by mouth 2 (two) times daily.   aspirin  EC 81 MG tablet Take 1 tablet (81 mg total) by mouth at bedtime.   atenolol  (TENORMIN ) 50 MG tablet Take 1 tablet (50 mg total) by mouth daily.   atorvastatin  (LIPITOR ) 80 MG tablet Take 1 tablet (80 mg total) by mouth daily.   calcium  carbonate (TUMS - DOSED IN MG ELEMENTAL CALCIUM ) 500 MG chewable tablet Chew 2 tablets by mouth as needed for indigestion or heartburn.    chlorpheniramine-HYDROcodone  (TUSSIONEX) 10-8 MG/5ML Take 5 mLs by mouth every 12 (twelve) hours as needed for cough. Caution: may cause sedation.   ciclopirox  (LOPROX ) 0.77 % cream Apply 1 Application topically daily as needed (irritation).   clopidogrel  (PLAVIX ) 75 MG tablet Take 1 tablet (75 mg total) by mouth daily with breakfast.   docusate sodium  (COLACE) 100 MG capsule Take 100 mg by mouth 2 (two) times daily.   Empagliflozin -metFORMIN  HCl ER (SYNJARDY XR) 25-1000 MG TB24 Take by mouth daily.   famotidine  (PEPCID ) 40 MG tablet Take 1 tablet (40 mg total) by mouth 2 (two) times daily.   galantamine  (RAZADYNE  ER) 8 MG 24 hr capsule Take 8 mg by mouth daily with breakfast.   glucose blood test strip Use to test blood sugar levels up to 3 (three) times a day.   hydrochlorothiazide  (HYDRODIURIL ) 25 MG tablet Take 1 tablet (25 mg total) by mouth in the  morning.   irbesartan  (AVAPRO ) 300 MG tablet Take 1 tablet (300 mg total) by mouth daily.   isosorbide  mononitrate (IMDUR ) 60 MG 24 hr tablet Take 1 tablet (60 mg total) by mouth daily.   ketoconazole  (NIZORAL ) 2 % cream Apply a small amount topically to skin 2 (two) times daily.   ketoconazole  (NIZORAL ) 2 % cream Apply a small amount to skin 2 (two) times daily.   melatonin 5 MG TABS Take 5 mg by mouth.   Naftifine HCl 2 % CREA Apply  1 Application topically 2 (two) times daily as needed (yeast).   pantoprazole  (PROTONIX ) 40 MG tablet Take 1 tablet (40 mg total) by mouth 2 (two) times daily.   polyethylene glycol (MIRALAX  / GLYCOLAX ) 17 g packet Take 17 g by mouth daily.   promethazine  (PHENERGAN ) 12.5 MG tablet Take 1 tablet (12.5 mg total) by mouth up to 2 (two) times daily as needed for nausea   ranolazine  (RANEXA ) 500 MG 12 hr tablet Take 1 tablet (500 mg total) by mouth 2 (two) times daily.   terbinafine  (LAMISIL ) 250 MG tablet Take 1 tablet (250 mg total) by mouth daily for 6 weeks.   TRESIBA  FLEXTOUCH 100 UNIT/ML FlexTouch Pen Inject 20 Units into the skin daily.   triamcinolone cream (KENALOG) 0.1 % Apply 1 application  topically 2 (two) times daily as needed (irritation).   Vitamin D , Cholecalciferol , 25 MCG (1000 UT) CAPS Take 1 capsule by mouth daily.   [DISCONTINUED] gabapentin  (NEURONTIN ) 300 MG capsule Take 2 capsules (600 mg total) by mouth at bedtime.     Allergies:   Hydrocodone , Methocarbamol , Sertraline  hcl, and Oxycodone    Social History   Socioeconomic History   Marital status: Married    Spouse name: [name]   Number of children: 3   Years of education: Not on file   Highest education level: High school graduate  Occupational History   Not on file  Tobacco Use   Smoking status: Never    Passive exposure: Never   Smokeless tobacco: Never  Vaping Use   Vaping status: Never Used  Substance and Sexual Activity   Alcohol  use: No   Drug use: No   Sexual  activity: Yes  Other Topics Concern   Not on file  Social History Narrative   Lives at home with wife and temporarily young son   Right handed   Caffeine: 1 C of coffee in the AM   Social Drivers of Health   Financial Resource Strain: Not on file  Food Insecurity: Not on file  Transportation Needs: Not on file  Physical Activity: Not on file  Stress: Not on file  Social Connections: Unknown (11/28/2022)   Received from [employer]   Social Network    Social Network: Not on file     Family History: The patient's family history includes Colon polyps in his mother; Coronary artery disease in his cousin; Diabetes type II in his mother; Heart attack in his father; Heart disease in his brother and father. There is no history of Colon cancer, Pancreatic cancer, Esophageal cancer, Stomach cancer, Liver disease, or Rectal cancer.  ROS:   Please see the history of present illness.    All other systems reviewed and are negative.  EKGs/Labs/Other Studies Reviewed:    The following studies were reviewed today:      Recent Labs: No results found for requested labs within last 365 days.  Recent Lipid Panel    Component Value Date/Time   CHOL 105 02/19/2020 0850   TRIG 78 02/19/2020 0850   HDL 36 (L) 02/19/2020 0850   CHOLHDL 2.9 02/19/2020 0850   CHOLHDL 2.7 07/26/2019 0052   VLDL 14 07/26/2019 0052   LDLCALC 53 02/19/2020 0850    Physical Exam:    VS:  BP 108/62   Pulse 65   Wt 196 lb (88.9 kg)   SpO2 99%   BMI 28.94 kg/m     Wt Readings from Last 3 Encounters:  11/29/23 196 lb (88.9 kg)  08/25/23 196  lb 12.8 oz (89.3 kg)  05/30/23 196 lb (88.9 kg)     GEN:  Well nourished, well developed in no acute distress HEENT: Normal NECK:  No carotid bruits CARDIAC: RRR, no murmurs, rubs, gallops RESPIRATORY:  Clear to auscultation without rales, wheezing or rhonchi  MUSCULOSKELETAL:  No edema; No deformity, DPs 2+ bilaterally SKIN: Warm and dry NEUROLOGIC:  Alert and  oriented x 3 PSYCHIATRIC:  Normal affect   ASSESSMENT:    1. Mild dilation of ascending aorta   2. Coronary artery disease involving native coronary artery of native heart, unspecified whether angina present   3. S/P CABG x 5   4. Primary hypertension   5. Hyperlipidemia, unspecified hyperlipidemia type   6. Pain in both lower extremities    PLAN:    In order of problems listed above:  Leg pain with activity Patient reports bilateral calf cramping with activity Relieved with rest Right worse than left Will order ABIs/TBIs, if abnormal will order arterial duplex  New ascending thoracic aortic aneurysm  Measuring 4.2 cm on NM PET scan on 08/25/2023 Prior CT angio chest and echo from 2018 showed no evidence of aortic aneurysm at that time. Prior echo 10/2016 shows a trileaflet aortic valve. Repeat CTA will be needed for follow up in 1 year.   CAD s/p CABG x 5 in 2018 LIMA-LAD, SVG-diagonal, SVG-circumflex, seq SVG-PLB and distal PDA DES x 3 in LAD (2015) Most recent LHC in 2021 showed all grafts to be patent w/ worsened native disease in RCA and ostial LM.  NM PET 11/01/2023: normal LV perfusion, low risk study.  Denies chest pain, shortness of breath, DOE, orthopnea, PND, palpitations, near-syncope, back pain, left arm pain Continue aspirin  EC 81 mg Continue atenolol  50 mg at bedtime Continue plavix  75 mg daily Continue nitroglycerin  0.4 mg SL tablet PRN chest pain every 5 minutes Continue ranexa  500 mg BID Continue Imdur  60 mg daily Patient reports he recently had lab work done with his PCP, who is not in our system.  Patient states they will bring the labs by and drop them off to have them scanned into our system.  HTN Reported to be well-controlled at home, systolics averaging around 120s, diastolics averaging 60s-70s Continue amlodipine  5 mg daily Continue atenolol  50 mg at bedtime Continue hydrochlorothiazide  25 mg daily Continue imdur  60 mg daily  HLD Patient  reports recently having had lab work drawn by his PCP, requests not to have labs done today to avoid a bill from [employer] by PCP Continue atorvastatin  80 mg daily  OSA Patient states that he has not used his CPAP since he lost 50 pounds Feels as though he does not need it Has not used it in years  Keep follow up appointment with Dr. Verlin in December  Medication Adjustments/Labs and Tests Ordered: Current medicines are reviewed at length with the patient today.  Concerns regarding medicines are outlined above.  Orders Placed This Encounter  Procedures   VAS US  LOWER EXT ART SEG MULTI (SEGMENTALS & LE RAYNAUDS)   Meds ordered this encounter  Medications   DISCONTD: gabapentin  (NEURONTIN ) 300 MG capsule    Sig: Take 2 capsules (600 mg total) by mouth at bedtime.    Patient Instructions  Medication Instructions:   Your physician recommends that you continue on your current medications as directed. Please refer to the Current Medication list given to you today.  *If you need a refill on your cardiac medications before your next appointment, please  call your pharmacy*    Testing/Procedures:  Your physician has requested that you have an ankle brachial index (ABI). During this test an ultrasound and blood pressure cuff are used to evaluate the arteries that supply the arms and legs with blood. Allow thirty minutes for this exam. There are no restrictions or special instructions.  Please note: We ask at that you not bring children with you during ultrasound (echo/ vascular) testing. Due to room size and safety concerns, children are not allowed in the ultrasound rooms during exams. Our front office staff cannot provide observation of children in our lobby area while testing is being conducted. An adult accompanying a patient to their appointment will only be allowed in the ultrasound room at the discretion of the ultrasound technician under special circumstances. We apologize  for any inconvenience.    Follow-Up:  AS SCHEDULED WITH DR. VERLIN IN [place]           Signed, Miriam FORBES Shams, NP  11/29/2023 5:04 PM    [facility]

## 2023-11-29 ENCOUNTER — Ambulatory Visit: Attending: Cardiovascular Disease | Admitting: Emergency Medicine

## 2023-11-29 ENCOUNTER — Encounter: Payer: Self-pay | Admitting: Physician Assistant

## 2023-11-29 VITALS — BP 108/62 | HR 65 | Wt 196.0 lb

## 2023-11-29 DIAGNOSIS — I10 Essential (primary) hypertension: Secondary | ICD-10-CM | POA: Diagnosis not present

## 2023-11-29 DIAGNOSIS — I77810 Thoracic aortic ectasia: Secondary | ICD-10-CM

## 2023-11-29 DIAGNOSIS — G4733 Obstructive sleep apnea (adult) (pediatric): Secondary | ICD-10-CM | POA: Diagnosis not present

## 2023-11-29 DIAGNOSIS — I2510 Atherosclerotic heart disease of native coronary artery without angina pectoris: Secondary | ICD-10-CM

## 2023-11-29 DIAGNOSIS — M79604 Pain in right leg: Secondary | ICD-10-CM | POA: Diagnosis not present

## 2023-11-29 DIAGNOSIS — E785 Hyperlipidemia, unspecified: Secondary | ICD-10-CM | POA: Diagnosis not present

## 2023-11-29 DIAGNOSIS — Z951 Presence of aortocoronary bypass graft: Secondary | ICD-10-CM | POA: Diagnosis not present

## 2023-11-29 DIAGNOSIS — M79605 Pain in left leg: Secondary | ICD-10-CM | POA: Diagnosis not present

## 2023-11-29 MED ORDER — GABAPENTIN 300 MG PO CAPS: 600.0000 mg | ORAL_CAPSULE | Freq: Every day | ORAL | Status: DC

## 2023-11-29 NOTE — Patient Instructions (Signed)
 Medication Instructions:   Your physician recommends that you continue on your current medications as directed. Please refer to the Current Medication list given to you today.  *If you need a refill on your cardiac medications before your next appointment, please call your pharmacy*    Testing/Procedures:  Your physician has requested that you have an ankle brachial index (ABI). During this test an ultrasound and blood pressure cuff are used to evaluate the arteries that supply the arms and legs with blood. Allow thirty minutes for this exam. There are no restrictions or special instructions.  Please note: We ask at that you not bring children with you during ultrasound (echo/ vascular) testing. Due to room size and safety concerns, children are not allowed in the ultrasound rooms during exams. Our front office staff cannot provide observation of children in our lobby area while testing is being conducted. An adult accompanying a patient to their appointment will only be allowed in the ultrasound room at the discretion of the ultrasound technician under special circumstances. We apologize for any inconvenience.    Follow-Up:  AS SCHEDULED WITH DR. VERLIN IN [place]

## 2023-12-04 ENCOUNTER — Ambulatory Visit (HOSPITAL_COMMUNITY)
Admission: RE | Admit: 2023-12-04 | Discharge: 2023-12-04 | Disposition: A | Discharge status: Home / Self Care | Source: Ambulatory Visit | Attending: Emergency Medicine | Admitting: Emergency Medicine

## 2023-12-04 ENCOUNTER — Other Ambulatory Visit: Payer: Self-pay | Admitting: Emergency Medicine

## 2023-12-04 DIAGNOSIS — M79605 Pain in left leg: Secondary | ICD-10-CM | POA: Diagnosis not present

## 2023-12-04 DIAGNOSIS — I10 Essential (primary) hypertension: Secondary | ICD-10-CM

## 2023-12-04 DIAGNOSIS — I77810 Thoracic aortic ectasia: Secondary | ICD-10-CM

## 2023-12-04 DIAGNOSIS — G4733 Obstructive sleep apnea (adult) (pediatric): Secondary | ICD-10-CM

## 2023-12-04 DIAGNOSIS — E785 Hyperlipidemia, unspecified: Secondary | ICD-10-CM

## 2023-12-04 DIAGNOSIS — M79604 Pain in right leg: Secondary | ICD-10-CM | POA: Diagnosis not present

## 2023-12-04 DIAGNOSIS — Z951 Presence of aortocoronary bypass graft: Secondary | ICD-10-CM

## 2023-12-04 DIAGNOSIS — I2510 Atherosclerotic heart disease of native coronary artery without angina pectoris: Secondary | ICD-10-CM

## 2023-12-05 ENCOUNTER — Ambulatory Visit: Payer: Self-pay | Admitting: Emergency Medicine

## 2023-12-05 LAB — VAS US ABI WITH/WO TBI
Left ABI: 1.16
Right ABI: 1.25

## 2023-12-06 ENCOUNTER — Other Ambulatory Visit (HOSPITAL_COMMUNITY): Payer: Self-pay

## 2023-12-06 MED ORDER — PROMETHAZINE HCL 12.5 MG PO TABS
12.5000 mg | ORAL_TABLET | Freq: Two times a day (BID) | ORAL | 3 refills | Status: AC | PRN
Start: 2023-12-06 — End: ?
  Filled 2023-12-06: qty 30, 15d supply, fill #0

## 2023-12-06 MED ORDER — GABAPENTIN 300 MG PO CAPS
600.0000 mg | ORAL_CAPSULE | Freq: Every evening | ORAL | 3 refills | Status: AC
  Filled 2023-12-06: qty 180, 90d supply, fill #0
  Filled 2024-03-04: qty 180, 90d supply, fill #1

## 2023-12-09 ENCOUNTER — Other Ambulatory Visit (HOSPITAL_COMMUNITY): Payer: Self-pay

## 2023-12-12 ENCOUNTER — Other Ambulatory Visit (HOSPITAL_COMMUNITY): Payer: Self-pay

## 2023-12-12 ENCOUNTER — Encounter (HOSPITAL_COMMUNITY): Payer: Self-pay

## 2023-12-12 MED ORDER — ALPRAZOLAM 0.5 MG PO TABS
0.5000 mg | ORAL_TABLET | Freq: Two times a day (BID) | ORAL | 1 refills | Status: AC
  Filled 2023-12-12: qty 60, 30d supply, fill #0
  Filled 2024-01-16: qty 60, 30d supply, fill #1

## 2023-12-13 ENCOUNTER — Other Ambulatory Visit: Payer: Self-pay

## 2023-12-13 ENCOUNTER — Other Ambulatory Visit (HOSPITAL_COMMUNITY): Payer: Self-pay

## 2023-12-14 ENCOUNTER — Other Ambulatory Visit: Payer: Self-pay

## 2023-12-14 ENCOUNTER — Other Ambulatory Visit (HOSPITAL_COMMUNITY): Payer: Self-pay

## 2023-12-18 DIAGNOSIS — M25512 Pain in left shoulder: Secondary | ICD-10-CM | POA: Diagnosis not present

## 2023-12-18 DIAGNOSIS — M19012 Primary osteoarthritis, left shoulder: Secondary | ICD-10-CM | POA: Diagnosis not present

## 2023-12-19 ENCOUNTER — Other Ambulatory Visit (HOSPITAL_COMMUNITY): Payer: Self-pay

## 2023-12-21 ENCOUNTER — Other Ambulatory Visit (HOSPITAL_COMMUNITY): Payer: Self-pay

## 2023-12-27 ENCOUNTER — Ambulatory Visit: Admitting: Podiatry

## 2023-12-30 ENCOUNTER — Other Ambulatory Visit (HOSPITAL_COMMUNITY): Payer: Self-pay

## 2024-01-01 ENCOUNTER — Ambulatory Visit: Admitting: Podiatry

## 2024-01-01 ENCOUNTER — Encounter: Payer: Self-pay | Admitting: Podiatry

## 2024-01-01 DIAGNOSIS — M79676 Pain in unspecified toe(s): Secondary | ICD-10-CM

## 2024-01-01 DIAGNOSIS — B351 Tinea unguium: Secondary | ICD-10-CM

## 2024-01-01 DIAGNOSIS — E1142 Type 2 diabetes mellitus with diabetic polyneuropathy: Secondary | ICD-10-CM

## 2024-01-01 DIAGNOSIS — Z0189 Encounter for other specified special examinations: Secondary | ICD-10-CM | POA: Diagnosis not present

## 2024-01-01 DIAGNOSIS — E119 Type 2 diabetes mellitus without complications: Secondary | ICD-10-CM

## 2024-01-01 NOTE — Progress Notes (Signed)
  Subjective:  Patient ID: Hunter JONETTA Darra Mickey., male    DOB: 11/26/1945,   MRN: [id number]  Chief Complaint  Patient presents with   Diabetes    Trim my nails.  Saw Dr. Shayne - 07/04/2023; A1c - 7.2?    78 y.o. male presents for concern of thickened elongated and painful nails that are difficult to trim. Requesting to have them trimmed today. Relates burning and tingling in their feet. Patient is diabetic and last A1c was  Lab Results  Component Value Date   HGBA1C 7.3 (H) 07/26/2019   .   PCP:  Hunter Anes, MD    . Denies any other pedal complaints. Denies n/v/f/c.   Past Medical History:  Diagnosis Date   Arthritis    lower back; right shoulder; left thumb; joints (06/21/2013)   Asthma    not sure if this is true or not (06/21/2013)   CAD (coronary artery disease) 06/2013   s/p PTCA/DES x 3 to mid LAD 2015. b. s/p CABG in 2018. c. cath 12/2017 -> med rx.   Dementia (HCC) 2023   per pt's wife   GERD (gastroesophageal reflux disease)    Gout    maybe twice in my life   Headache(784.0)    weekly for the last 3-4 months (06/21/2013)   Hyperlipidemia    Hypertension    Myocardial infarction (HCC)    /Dr. [name] Bouillon had one between 2014-2015) (06/21/2013)   Neuromuscular disorder (HCC)    Osteoarthritis    Skin cancer; face 2023   Sleep apnea    WEIGHT LOSS, NO LONGER NEEDS PER PATIENT   Type II diabetes mellitus (HCC)    TYPE 2    Objective:  Physical Exam: Vascular: DP/PT pulses 2/4 bilateral. CFT <3 seconds. Absent hair growth on digits. No edema. Xerosis present.  Skin. No lacerations or abrasions bilateral feet. Left hallux nail bed healing well. Nails 1-5 bilateral are thickened elongated and with subungual debris.  Musculoskeletal: MMT 5/5 bilateral lower extremities in DF, PF, Inversion and Eversion. Deceased ROM in DF of ankle joint. No pain to palpation of the foot.  Neurological: Sensation intact to light touch. Protective sensation intact b/l.    Assessment:   1. Pain due to onychomycosis of toenail   2. Diabetic peripheral neuropathy associated with type 2 diabetes mellitus (HCC)   3. Encounter for diabetic foot exam (HCC)         Plan:  Patient was evaluated and treated and all questions answered. -Discussed and educated patient on diabetic foot care, especially with  regards to the vascular, neurological and musculoskeletal systems.  -Stressed the importance of good glycemic control and the detriment of not  controlling glucose levels in relation to the foot. -Discussed supportive shoes at all times and checking feet regularly.  -Mechanically debrided all nails 1-5 bilateral using sterile nail nipper and filed with dremel without incident  -Answered all patient questions -Patient to return  in 3 months for at risk foot care -Patient advised to call the office if any problems or questions arise in the meantime.    Asberry Failing, DPM

## 2024-01-03 ENCOUNTER — Other Ambulatory Visit (HOSPITAL_COMMUNITY): Payer: Self-pay

## 2024-01-04 ENCOUNTER — Other Ambulatory Visit (HOSPITAL_COMMUNITY): Payer: Self-pay

## 2024-01-04 MED ORDER — ATENOLOL 50 MG PO TABS
50.0000 mg | ORAL_TABLET | Freq: Every day | ORAL | 3 refills | Status: AC
  Filled 2024-01-04: qty 90, 90d supply, fill #0

## 2024-01-09 NOTE — Progress Notes (Signed)
 Chief Complaint  Patient presents with   Memory Loss    Rm1, wife present, Memory loss: moca score of 27    HISTORY OF PRESENT ILLNESS:  01/15/24 ALL:  Hunter Mcbride returns for follow up for memory loss and REM behavior disorder and memory loss. ATN profile 05/2023 showed p-tau181 and NfL  elevated. Beta-amyloid ratio normal. PET scan 10/2021 showed mild bilateral decreased hypermetabolism within the parietotemporal underlying cortices with normal cortical uptake in the frontal lobes.   He feels memory is stable. He has most difficulty remembering names of people. He usually can figure it out with time. He is more forgetful of shutting doors. Easily distracted. He completed ADLs independently. He manages medications. Able to manage finances. He drives without any difficulty. He is sleeping well. He continues to have significant activity at night. Usually dreams of fighting someone. He is easily awakened. He is not aggressive with wife. She denies concerns of delusional thoughts or hallucinations during the day. He continues galantamine  per PCP.   He continues Plavix  and asa. Also taking atorvastatin  80mg . BP stable. A1C 11/2022 was 8.9. He continues close follow up with Dr Shayne. He is very active. Trying to monitor diet.   03/01/2023 ALL:  Hunter JONETTA Darra Mickey. is a 78 y.o. male here today for follow up for memory loss and REM sleep disorder. He was last seen by Dr Chalice 06/2022 and reported more difficulty with right arm movements, right upper ext rigidity and bilateral cogwheeling on exam since lumbar fusion 05/2022. PET scan and p-tau181 abnormal, concerning for AD. MOCA 29/30. Neuropsych referral placed and galantamine  continued per PCP. He underwent L4-5 microdiscectomy with Dr Smith 12/15/2022.   Since, he reports doing well. Back pain is significantly improved. He is no longer taking opiate pain medications. Discontinued Lyrica . Now taking gabapentin  300mg  at bedtime.   He feels memory is  stable. He has most difficulty remembering names of people. He usually can figure it out with time. He completed ADLs independently. He manages medicaitons. Able to manage finances. He drives without any difficulty. He is sleeping well. Dreams are less frequent and he does not seem to be as restless.   He continues Plavix  and asa. Also taking atorvastatin  80mg . BP stable. A1C 11/2022 was 8.9. He continues close follow up with Dr Shayne. He is very active. Trying to monitor diet.   HISTORY (copied from Dr Dohmeier's previous note)  Hunter JONETTA Darra Mickey. is a 78 y.o. male patient who is here for revisit 07/04/2022 for  memory loss.  He was first presenting with REM sleep BD symptoms. Sleep study confirmed many PLMs without arousals. No apnea. Wife reports these go on ,  yelling out or moving the hands and arms- but not falling out of bed. Not aggressive. He is amnestic for these events.   His work up included a PET scan.  Serum studies: P Tau confirms elevated A -Beta amyloid pathology- and this protein is typically associated  with Alzheimer's disease. It confirms a pathology that helps to differentiate dementias at an early stage of memory loss.     Chief concern according to patient :  I was given prednisone after back surgery and I felt wired, now improved overall , less pain.  He still presents with rigidity over the right arm and wrist, cogwheeling.      Sleep study : 09-27-2021. The patient had a total of 141 Limb Movements.  The Periodic Limb Movement (PLM) index was 19.1 and  the PLM Arousal index was 0.4/hour. Few of these limb movements were noted in REM but there were movements in REM as well as sleep talking. PLMs were clustered in the time between 21.45 and 23.00 hours.  the patient slept on his back during that time. Audio and video analysis do show movements in REM , behaviors, phonations and vocalizations.   EKG was in keeping with normal sinus rhythm (NSR).   IMPRESSION:   1.         No evidence of Obstructive Sleep Apnea (OSA) 2.        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 REM sleep somniloquism.   REVIEW OF SYSTEMS: Out of a complete 14 system review of symptoms, the patient complains only of the following symptoms, restless sleep, memory loss, and all other reviewed systems are negative.   ALLERGIES: Allergies  Allergen Reactions   Donepezil Nausea Only   Metformin      Other Reaction(s): Not available   Metformin  Hcl Er     Other Reaction(s): nausea and GI distress   Sertraline  Hcl     Other Reaction(s): zombie   Hydrocodone  Nausea Only    Other reaction(s): nauseated  Other Reaction(s): nauseated, Not available  hydrocodone    Methocarbamol  Nausea Only    Other reaction(s): nauseated  Other Reaction(s): nauseated, Not available  methocarbamol    Oxycodone  Nausea Only     HOME MEDICATIONS: Outpatient Medications Prior to Visit  Medication Sig Dispense Refill   ALPRAZolam  (XANAX ) 0.5 MG tablet Take 1 tablet (0.5 mg total) by mouth up to 2 (two) times daily as needed for anxiety as directed. 60 tablet 1   amLODipine  (NORVASC ) 5 MG tablet Take 1 tablet (5 mg total) by mouth 2 (two) times daily. 180 tablet 2   aspirin  EC 81 MG tablet Take 1 tablet (81 mg total) by mouth at bedtime. 30 tablet 11   atenolol  (TENORMIN ) 50 MG tablet Take 1 tablet (50 mg total) by mouth daily. 100 tablet 3   atorvastatin  (LIPITOR ) 80 MG tablet Take 1 tablet (80 mg total) by mouth daily. 90 tablet 3   calcium  carbonate (TUMS - DOSED IN MG ELEMENTAL CALCIUM ) 500 MG chewable tablet Chew 2 tablets by mouth as needed for indigestion or heartburn.      chlorpheniramine-HYDROcodone  (TUSSIONEX) 10-8 MG/5ML Take 5 mLs by mouth every 12 (twelve) hours as needed for cough. Caution: may cause sedation. 100 mL 0   ciclopirox  (LOPROX ) 0.77 % cream Apply 1  Application topically daily as needed (irritation).     clopidogrel  (PLAVIX ) 75 MG tablet Take 1 tablet (75 mg total) by mouth daily with breakfast. 90 tablet 3   docusate sodium  (COLACE) 100 MG capsule Take 100 mg by mouth 2 (two) times daily.     Empagliflozin -metFORMIN  HCl ER (SYNJARDY XR) 25-1000 MG TB24 Take by mouth daily.     famotidine  (PEPCID ) 40 MG tablet Take 1 tablet (40 mg total) by mouth 2 (two) times daily. 60 tablet 5   gabapentin  (NEURONTIN ) 300 MG capsule Take 2 capsules (600 mg total) by mouth at bedtime. 180 capsule 3   galantamine  (RAZADYNE  ER) 8 MG 24 hr capsule Take 8 mg by mouth daily with breakfast.     glucose blood test strip Use to test blood sugar levels up to 3 (three) times a day. 300 each 11   hydrochlorothiazide  (HYDRODIURIL ) 25  MG tablet Take 1 tablet (25 mg total) by mouth in the morning. 90 tablet 3   irbesartan  (AVAPRO ) 300 MG tablet Take 1 tablet (300 mg total) by mouth daily. 90 tablet 3   isosorbide  mononitrate (IMDUR ) 60 MG 24 hr tablet Take 1 tablet (60 mg total) by mouth daily. 90 tablet 3   ketoconazole  (NIZORAL ) 2 % cream Apply a small amount topically to skin 2 (two) times daily. 60 g 2   ketoconazole  (NIZORAL ) 2 % cream Apply a small amount to skin 2 (two) times daily. 60 g 2   melatonin 5 MG TABS Take 5 mg by mouth.     Naftifine HCl 2 % CREA Apply 1 Application topically 2 (two) times daily as needed (yeast).     nitroGLYCERIN  (NITROSTAT ) 0.4 MG SL tablet Place 1 tablet (0.4 mg total) under the tongue as needed for chest pain, if no relief call 911. 25 tablet 5   pantoprazole  (PROTONIX ) 40 MG tablet Take 1 tablet (40 mg total) by mouth 2 (two) times daily. 180 tablet 0   polyethylene glycol (MIRALAX  / GLYCOLAX ) 17 g packet Take 17 g by mouth daily.     promethazine  (PHENERGAN ) 12.5 MG tablet Take 1 tablet (12.5 mg total) by mouth up to 2 (two) times daily as needed for nausea 30 tablet 1   promethazine  (PHENERGAN ) 12.5 MG tablet Take 1 tablet (12.5  mg total) by mouth up to 2 (two) times daily as needed for nausea. 30 tablet 3   ranolazine  (RANEXA ) 500 MG 12 hr tablet Take 1 tablet (500 mg total) by mouth 2 (two) times daily. 180 tablet 3   terbinafine  (LAMISIL ) 250 MG tablet Take 1 tablet (250 mg total) by mouth daily for 6 weeks. 44 tablet 0   TRESIBA  FLEXTOUCH 100 UNIT/ML FlexTouch Pen Inject 20 Units into the skin daily.     triamcinolone cream (KENALOG) 0.1 % Apply 1 application  topically 2 (two) times daily as needed (irritation).     Vitamin D , Cholecalciferol , 25 MCG (1000 UT) CAPS Take 1 capsule by mouth daily.     ALPRAZolam  (XANAX ) 0.5 MG tablet Take 1 tablet (0.5 mg total) by mouth up to 2 (two) times daily as directed, as needed for anxiety. 60 tablet 0   ALPRAZolam  (XANAX ) 0.5 MG tablet Take 1 tablet (0.5 mg total) by mouth up to 2 (two) times daily as needed for anxiety. 60 tablet 0   ALPRAZolam  (XANAX ) 0.5 MG tablet Take 1 tablet (0.5 mg total) up to 2 (two) times daily as needed for Anxiety 60 tablet 1   FREESTYLE TEST STRIPS test strip 1 each by Other route every morning.      gabapentin  (NEURONTIN ) 300 MG capsule Take 2 capsules (600 mg total) by mouth at bedtime.     metFORMIN  (GLUCOPHAGE -XR) 500 MG 24 hr tablet Take 1 tablet (500 mg total) by mouth 2 (two) times daily. 180 tablet 3   No facility-administered medications prior to visit.     PAST MEDICAL HISTORY: Past Medical History:  Diagnosis Date   Arthritis    lower back; right shoulder; left thumb; joints (06/21/2013)   Asthma    not sure if this is true or not (06/21/2013)   CAD (coronary artery disease) 06/2013   s/p PTCA/DES x 3 to mid LAD 2015. b. s/p CABG in 2018. c. cath 12/2017 -> med rx.   Dementia (HCC) 2023   per pt's wife   GERD (gastroesophageal reflux disease)  Gout    maybe twice in my life   Headache(784.0)    weekly for the last 3-4 months (06/21/2013)   Hyperlipidemia    Hypertension    Myocardial infarction (HCC)    /Dr. [name] Bouillon had one between 2014-2015) (06/21/2013)   Neuromuscular disorder (HCC)    Osteoarthritis    Skin cancer; face 2023   Sleep apnea    WEIGHT LOSS, NO LONGER NEEDS PER PATIENT   Type II diabetes mellitus (HCC)    TYPE 2     PAST SURGICAL HISTORY: Past Surgical History:  Procedure Laterality Date   CARDIAC CATHETERIZATION  1980's   once   CARDIAC CATHETERIZATION N/A 07/23/2015   Procedure: Left Heart Cath and Coronary Angiography;  Surgeon: Ozell Fell, MD;  Location: [facility] INVASIVE CV LAB;  Service: Cardiovascular;  Laterality: N/A;   CARDIOVASCULAR STRESS TEST  06/10/2008   EF 68%   CARPAL TUNNEL RELEASE Bilateral    COLONOSCOPY     CORONARY ANGIOPLASTY WITH STENT PLACEMENT  06/21/2013   3   CORONARY ARTERY BYPASS GRAFT N/A 11/07/2016   Procedure: CORONARY ARTERY BYPASS GRAFTING (CABG) x 5 (LIMA to DISTAL LAD, SVG to DIAGONAL, SVG to CIRCUMFLEX, and SVG SEQUENTIALLY to PLB and DISTAL PDA) with EVH of the RIGHT GREATER SAPHENOUS VEIN and LEFT INTERNAL MAMMARY ARTERY HARVEST;  Surgeon: Army Dallas NOVAK, MD;  Location: [facility] OR;  Service: Open Heart Surgery;  Laterality: N/A;   CORONARY PRESSURE/FFR STUDY N/A 10/28/2016   Procedure: INTRAVASCULAR PRESSURE WIRE/FFR STUDY;  Surgeon: Anner Alm ORN, MD;  Location: [facility] INVASIVE CV LAB;  Service: Cardiovascular;  Laterality: N/A;   CORONARY STENT INTERVENTION N/A 06/07/2016   Procedure: Coronary Stent Intervention;  Surgeon: Lonni JONETTA Cash, MD;  Location: [facility] INVASIVE CV LAB;  Service: Cardiovascular;  Laterality: N/A;   EXCISION MORTON'S NEUROMA Left    EYE SURGERY Left    removed film over my eye   FRACTIONAL FLOW RESERVE WIRE N/A 06/26/2013   Procedure: FRACTIONAL FLOW RESERVE WIRE;  Surgeon: Deatrice DELENA Cage, MD;  Location: [facility] CATH LAB;  Service: Cardiovascular;  Laterality: N/A;   HERNIA REPAIR Left    JOINT REPLACEMENT Left 03/2013   thumb   LEFT HEART CATH AND CORONARY ANGIOGRAPHY N/A 06/07/2016   Procedure: Left  Heart Cath and Coronary Angiography;  Surgeon: Lonni JONETTA Cash, MD;  Location: [facility] INVASIVE CV LAB;  Service: Cardiovascular;  Laterality: N/A;   LEFT HEART CATH AND CORONARY ANGIOGRAPHY N/A 10/28/2016   Procedure: LEFT HEART CATH AND CORONARY ANGIOGRAPHY;  Surgeon: Anner Alm ORN, MD;  Location: [facility] INVASIVE CV LAB;  Service: Cardiovascular;  Laterality: N/A;   LEFT HEART CATH AND CORS/GRAFTS ANGIOGRAPHY N/A 12/25/2017   Procedure: LEFT HEART CATH AND CORS/GRAFTS ANGIOGRAPHY;  Surgeon: Fell Ozell, MD;  Location: [facility] INVASIVE CV LAB;  Service: Cardiovascular;  Laterality: N/A;   LEFT HEART CATH AND CORS/GRAFTS ANGIOGRAPHY N/A 07/26/2019   Procedure: LEFT HEART CATH AND CORS/GRAFTS ANGIOGRAPHY;  Surgeon: Cage Deatrice DELENA, MD;  Location: [facility] INVASIVE CV LAB;  Service: Cardiovascular;  Laterality: N/A;   LEFT HEART CATHETERIZATION WITH CORONARY ANGIOGRAM N/A 06/21/2013   Procedure: LEFT HEART CATHETERIZATION WITH CORONARY ANGIOGRAM;  Surgeon: Lonni JONETTA Cash, MD;  Location: [facility] CATH LAB;  Service: Cardiovascular;  Laterality: N/A;   LEFT HEART CATHETERIZATION WITH CORONARY ANGIOGRAM N/A 06/26/2013   Procedure: LEFT HEART CATHETERIZATION WITH CORONARY ANGIOGRAM;  Surgeon: Deatrice DELENA Cage, MD;  Location: [facility] CATH LAB;  Service: Cardiovascular;  Laterality: N/A;   LUMBAR  LAMINECTOMY/DECOMPRESSION MICRODISCECTOMY Left 05/30/2018   Procedure: Left  Gill L5 decompression;  Surgeon: Burnetta Aures, MD;  Location: [facility] OR;  Service: Orthopedics;  Laterality: Left;    POLYPECTOMY     TEE WITHOUT CARDIOVERSION N/A 11/07/2016   Procedure: TRANSESOPHAGEAL ECHOCARDIOGRAM (TEE);  Surgeon: Army Dallas NOVAK, MD;  Location: [facility] OR;  Service: Open Heart Surgery;  Laterality: N/A;   TONSILLECTOMY     TOTAL HIP ARTHROPLASTY Right 04/12/2016   Procedure: RIGHT TOTAL HIP ARTHROPLASTY ANTERIOR APPROACH;  Surgeon: Donnice Car, MD;  Location: [facility] ORS;  Service: Orthopedics;  Laterality: Right;  requests  70 mins   TOTAL SHOULDER ARTHROPLASTY  03/01/2012   Procedure: TOTAL SHOULDER ARTHROPLASTY;  Surgeon: Franky CHRISTELLA Pointer, MD;  Location: [facility] OR;  Service: Orthopedics;  Laterality: Right;   UMBILICAL HERNIA REPAIR       FAMILY HISTORY: Family History  Problem Relation Age of Onset   Colon polyps Mother    Diabetes type II Mother    Heart disease Father    Heart attack Father    Heart disease Brother    Coronary artery disease Cousin    Colon cancer Neg Hx    Pancreatic cancer Neg Hx    Esophageal cancer Neg Hx    Stomach cancer Neg Hx    Liver disease Neg Hx    Rectal cancer Neg Hx      SOCIAL HISTORY: Social History   Socioeconomic History   Marital status: Married    Spouse name: Agricultural Engineer   Number of children: 3   Years of education: Not on file   Highest education level: High school graduate  Occupational History   Not on file  Tobacco Use   Smoking status: Never    Passive exposure: Never   Smokeless tobacco: Never  Vaping Use   Vaping status: Never Used  Substance and Sexual Activity   Alcohol  use: No   Drug use: No   Sexual activity: Yes  Other Topics Concern   Not on file  Social History Narrative   Lives at home with wife and temporarily young son   Right handed   Caffeine: 1 C of coffee in the AM   Social Drivers of Health   Financial Resource Strain: Not on file  Food Insecurity: Not on file  Transportation Needs: Not on file  Physical Activity: Not on file  Stress: Not on file  Social Connections: Not on file  Intimate Partner Violence: Unknown (11/28/2022)   Received from [facility]   HITS    Scream or Curse: Not on file    Threaten Physical Harm: Not on file    Insult or Talk Down To: Not on file    Physically Hurt: Not on file     PHYSICAL EXAM  Vitals:   01/15/24 1035  BP: 138/80  Pulse: 65  Resp: 14  SpO2: 99%  Height: 5' 9 (1.753 m)    Body mass index is 28.94 kg/m.  Generalized: Well developed, in no acute  distress  Cardiology: normal rate and rhythm, no murmur auscultated  Respiratory: clear to auscultation bilaterally    Neurological examination  Mentation: Alert oriented to time, place, history taking. Follows all commands speech and language fluent Cranial nerve II-XII: Pupils were equal round reactive to light. Extraocular movements were full, visual field were full on confrontational test. Facial sensation and strength were normal. Uvula tongue midline. Head turning and shoulder shrug  were normal and symmetric. Motor: The motor testing reveals 5  over 5 strength of all 4 extremities. Good symmetric motor tone is noted throughout. No tremor or cogwheeling noted  Sensory: Sensory testing is intact to soft touch on all 4 extremities. No evidence of extinction is noted.  Coordination: Cerebellar testing reveals good finger-nose-finger and heel-to-shin bilaterally.  Gait and station: Gait is normal.  Reflexes: Deep tendon reflexes are symmetric and normal bilaterally.    DIAGNOSTIC DATA (LABS, IMAGING, TESTING) - I reviewed patient records, labs, notes, testing and imaging myself where available.  Lab Results  Component Value Date   WBC 3.4 (L) 05/20/2022   HGB 15.1 05/20/2022   HCT 46.1 05/20/2022   MCV 93.5 05/20/2022   PLT 127 (L) 05/20/2022      Component Value Date/Time   NA 139 05/20/2022 1100   NA 142 09/27/2021 1112   K 4.6 05/20/2022 1100   CL 102 05/20/2022 1100   CO2 27 05/20/2022 1100   GLUCOSE 247 (H) 05/20/2022 1100   BUN 22 05/20/2022 1100   BUN 26 09/27/2021 1112   CREATININE 1.07 05/20/2022 1100   CREATININE 0.92 07/22/2015 0921   CALCIUM  9.4 05/20/2022 1100   PROT 7.2 02/19/2020 0850   ALBUMIN  4.6 02/19/2020 0850   AST 17 02/19/2020 0850   ALT 24 02/19/2020 0850   ALKPHOS 88 02/19/2020 0850   BILITOT 0.5 02/19/2020 0850   GFRNONAA >60 05/20/2022 1100   GFRAA 58 (L) 02/19/2020 0850   Lab Results  Component Value Date   CHOL 105 02/19/2020   HDL 36  (L) 02/19/2020   LDLCALC 53 02/19/2020   TRIG 78 02/19/2020   CHOLHDL 2.9 02/19/2020   Lab Results  Component Value Date   HGBA1C 7.3 (H) 07/26/2019   No results found for: VITAMINB12 Lab Results  Component Value Date   TSH 1.532 07/25/2019        No data to display              01/15/2024   10:38 AM 05/30/2023    3:33 PM 07/04/2022    3:54 PM 11/29/2021   10:30 AM 09/27/2021   10:03 AM  Montreal Cognitive Assessment   Visuospatial/ Executive (0/5) 5 4 5 4 3   Naming (0/3) 3 3 3 3 3   Attention: Read list of digits (0/2) 2 2 2 2 2   Attention: Read list of letters (0/1) 1 1 1 1 1   Attention: Serial 7 subtraction starting at 100 (0/3) 2 3 3 3 1   Language: Repeat phrase (0/2) 1 2 2 2 2   Language : Fluency (0/1) 1 1 1 1 1   Abstraction (0/2) 2 2 2 2 2   Delayed Recall (0/5) 4 3 5 2 4   Orientation (0/6) 6 6 5 6 6   Total 27 27 29 26 25      ASSESSMENT AND PLAN  78 y.o. year old male  has a past medical history of Arthritis, Asthma, CAD (coronary artery disease) (06/2013), Dementia (HCC) (2023), GERD (gastroesophageal reflux disease), Gout, Headache(784.0), Hyperlipidemia, Hypertension, Myocardial infarction [facility]), Neuromuscular disorder (HCC), Osteoarthritis, Skin cancer; face (2023), Sleep apnea, and Type II diabetes mellitus (HCC). here with    MCI (mild cognitive impairment) with memory loss  Abnormal PET scan of head  RBD (REM behavioral disorder)   Hunter JONETTA Darra Mickey. is doing well, today. We will continue to monitor for now. Back and neck pain significant improved following surgery. He does report more restless sleep behaviors. Failed clonazepam . Neuro exam intact, today. We will continue to monitor. We have discussed  abnormal p-tau181 and PET scan. MOCA 27/30. He requests to hold future memory testing due to severe anxiety associated with testing. Neuropsychology consult cancelled due to anxiety associated with testing. Discussed pros and cons of neuropsychology  consult. He will continue galantamine  for now per PCP. We will add memantine mg BID. Memory compensation strategies reviewed. Healthy lifestyle habits encouraged. He will follow up with PCP as directed. Stroke prevention per PCP advised. He will return to see Dr Chalice in  months to review options for additional workup. He and his wife verbalize understanding and agreement with this plan.    No orders of the defined types were placed in this encounter.    Meds ordered this encounter  Medications   memantine (NAMENDA) 5 MG tablet    Sig: Take 1 tablet (5 mg total) by mouth 2 (two) times daily.    Dispense:  180 tablet    Refill:  1    Supervising Provider:   YAN, YIJUN [phone]    I personally spent a total of 40 minutes in the care of the patient today including preparing to see the patient, getting/reviewing separately obtained history, performing a medically appropriate exam/evaluation, counseling and educating, placing orders, documenting clinical information in the EHR, independently interpreting results, and communicating results.   Greig Forbes, MSN, FNP-C 01/15/2024, 11:28 AM  [facility] [address], [address] [place], [place] [address] [phone]

## 2024-01-09 NOTE — Patient Instructions (Addendum)
 Below is our plan:  We will continue to monitor. We will add memantine  5mg  daily. If doing well, we will increase dose to 5mg  twice daily after about 1-2 weeks.   Goals:  1) Maintain strict control of hypertension with blood pressure goal below 130/90 2) Maintain good control of diabetes with hemoglobin A1c goal below 7%  3) Maintain good control of lipids with LDL cholesterol goal below 70 mg/dL.  4) Eat a healthy diet with plenty of whole grains, cereals, fruits and vegetables, exercise regularly and maintain ideal body weight   Resources: https://www.williams.biz/  Please make sure you are staying well hydrated. I recommend 50-60 ounces daily. Well balanced diet and regular exercise encouraged. Consistent sleep schedule with 6-8 hours recommended.   Please continue follow up with care team as directed.   Follow up with Dr Chalice in 6 months  You may receive a survey regarding today's visit. I encourage you to leave honest feed back as I do use this information to improve patient care. Thank you for seeing me today!   Management of Memory Problems   There are some general things you can do to help manage your memory problems.  Your memory may not in fact recover, but by using techniques and strategies you will be able to manage your memory difficulties better.   1)  Establish a routine. Try to establish and then stick to a regular routine.  By doing this, you will get used to what to expect and you will reduce the need to rely on your memory.  Also, try to do things at the same time of day, such as taking your medication or checking your calendar first thing in the morning. Think about think that you can do as a part of a regular routine and make a list.  Then enter them into a daily planner to remind you.  This will help you establish a routine.   2)  Organize your environment. Organize your environment so that it is  uncluttered.  Decrease visual stimulation.  Place everyday items such as keys or cell phone in the same place every day (ie.  Basket next to front door) Use post it notes with a brief message to yourself (ie. Turn off light, lock the door) Use labels to indicate where things go (ie. Which cupboards are for food, dishes, etc.) Keep a notepad and pen by the telephone to take messages   3)  Memory Aids A diary or journal/notebook/daily planner Making a list (shopping list, chore list, to do list that needs to be done) Using an alarm as a reminder (kitchen timer or cell phone alarm) Using cell phone to store information (Notes, Calendar, Reminders) Calendar/White board placed in a prominent position Post-it notes   In order for memory aids to be useful, you need to have good habits.  It's no good remembering to make a note in your journal if you don't remember to look in it.  Try setting aside a certain time of day to look in journal.   4)  Improving mood and managing fatigue. There may be other factors that contribute to memory difficulties.  Factors, such as anxiety, depression and tiredness can affect memory. Regular gentle exercise can help improve your mood and give you more energy. Exercise: there are short videos created by the [employer] on Health specially for older adults: https://bit.ly/2I30q97.  Mediterranean diet: which emphasizes fruits, vegetables, whole grains, legumes, fish, and other seafood; unsaturated fats such as olive  oils; and low amounts of red meat, eggs, and sweets. A variation of this, called MIND (Mediterranean-DASH Intervention for Neurodegenerative Delay) incorporates the DASH (Dietary Approaches to Stop Hypertension) diet, which has been shown to lower high blood pressure, a risk factor for Alzheimer's disease. More information at: exitmarketing.de.  Aerobic exercise that improve heart  health is also good for the mind.  [employer] on Aging have short videos for exercises that you can do at home: Blindworkshop.com.pt Simple relaxation techniques may help relieve symptoms of anxiety Try to get back to completing activities or hobbies you enjoyed doing in the past. Learn to pace yourself through activities to decrease fatigue. Find out about some local support groups where you can share experiences with others. Try and achieve 7-8 hours of sleep at night.   Tasks to improve attention/working memory 1. Good sleep hygiene (7-8 hrs of sleep) 2. Learning a new skill (Painting, Carpentry, Pottery, new language, Knitting). 3.Cognitive exercises (keep a daily journal, Puzzles) 4. Physical exercise and training  (30 min/day X 4 days week) 5. Being on Antidepressant if needed 6.Yoga, Meditation, Tai Chi 7. Decrease alcohol  intake 8.Have a clear schedule and structure in daily routine   MIND Diet: The Mediterranean-DASH Diet Intervention for Neurodegenerative Delay, or MIND diet, targets the health of the aging brain. Research participants with the highest MIND diet scores had a significantly slower rate of cognitive decline compared with those with the lowest scores. The effects of the MIND diet on cognition showed greater effects than either the Mediterranean or the DASH diet alone.   The healthy items the MIND diet guidelines suggest include:   3+ servings a day of whole grains 1+ servings a day of vegetables (other than green leafy) 6+ servings a week of green leafy vegetables 5+ servings a week of nuts 4+ meals a week of beans 2+ servings a week of berries 2+ meals a week of poultry 1+ meals a week of fish Mainly olive oil if added fat is used   The unhealthy items, which are higher in saturated and trans fat, include: Less than 5 servings a week of pastries and sweets Less than 4 servings a week of red meat (including beef, pork, lamb, and products made from  these meats) Less than one serving a week of cheese and fried foods Less than 1 tablespoon a day of butter/stick margarine

## 2024-01-15 ENCOUNTER — Other Ambulatory Visit (HOSPITAL_COMMUNITY): Payer: Self-pay

## 2024-01-15 ENCOUNTER — Encounter: Payer: Self-pay | Admitting: Family Medicine

## 2024-01-15 ENCOUNTER — Ambulatory Visit: Admitting: Family Medicine

## 2024-01-15 VITALS — BP 138/80 | HR 65 | Resp 14 | Ht 69.0 in

## 2024-01-15 DIAGNOSIS — G4752 REM sleep behavior disorder: Secondary | ICD-10-CM | POA: Diagnosis not present

## 2024-01-15 DIAGNOSIS — R9402 Abnormal brain scan: Secondary | ICD-10-CM

## 2024-01-15 DIAGNOSIS — G3184 Mild cognitive impairment, so stated: Secondary | ICD-10-CM | POA: Diagnosis not present

## 2024-01-15 MED ORDER — MEMANTINE HCL 5 MG PO TABS
5.0000 mg | ORAL_TABLET | Freq: Two times a day (BID) | ORAL | 1 refills | Status: AC
  Filled 2024-01-15: qty 180, 90d supply, fill #0

## 2024-01-16 ENCOUNTER — Other Ambulatory Visit: Payer: Self-pay

## 2024-01-24 ENCOUNTER — Other Ambulatory Visit (HOSPITAL_COMMUNITY): Payer: Self-pay

## 2024-01-24 ENCOUNTER — Other Ambulatory Visit: Payer: Self-pay

## 2024-01-26 ENCOUNTER — Other Ambulatory Visit (HOSPITAL_COMMUNITY): Payer: Self-pay

## 2024-01-29 ENCOUNTER — Other Ambulatory Visit: Payer: Self-pay

## 2024-01-29 ENCOUNTER — Other Ambulatory Visit (HOSPITAL_COMMUNITY): Payer: Self-pay

## 2024-01-29 MED ORDER — ATORVASTATIN CALCIUM 80 MG PO TABS
80.0000 mg | ORAL_TABLET | Freq: Every day | ORAL | 3 refills | Status: AC
  Filled 2024-01-29: qty 90, 90d supply, fill #0

## 2024-02-01 NOTE — Progress Notes (Signed)
 Hunter Mcbride.                                          MRN: [id number]   02/01/2024   The [facility] Quality Team Specialist reviewed this patient medical record for the purposes of chart review for care gap closure. The following were reviewed: chart review for care gap closure-kidney health evaluation for diabetes:eGFR  and uACR.    [facility] Quality Team

## 2024-02-06 NOTE — Progress Notes (Signed)
 "   Chief Complaint  Patient presents with   Follow-up    CAD   History of Present Illness: 78 yo male with history of 5V CAD s/p CABG, HTN, HLD, DM, GERD, thoracic aortic aneurysm and sleep apnea who is here today for follow up. He has been followed in our office by [name]. He had stenting of the LAD in 2015. He had 5V CABG in 2018. Cardiac cath in June 2021 with patency of all vein grafts. PET CT stress test in September 2025 with no evidence of ischemia. LV function was normal. The ascending aorta was dilated at 4.2 cm.   He is here today for follow up. The patient denies any chest pain, dyspnea, palpitations, lower extremity edema, orthopnea, PND, dizziness, near syncope or syncope.   He enjoys deer hunting.   Primary Care Physician: [name], MD   Past Medical History:  Diagnosis Date   Arthritis    lower back; right shoulder; left thumb; joints (06/21/2013)   Asthma    not sure if this is true or not (06/21/2013)   CAD (coronary artery disease) 06/2013   s/p PTCA/DES x 3 to mid LAD 2015. b. s/p CABG in 2018. c. cath 12/2017 -> med rx.   Dementia (HCC) 2023   per pt's wife   GERD (gastroesophageal reflux disease)    Gout    maybe twice in my life   Headache(784.0)    weekly for the last 3-4 months (06/21/2013)   Hyperlipidemia    Hypertension    Myocardial infarction (HCC)    /Dr. [name] I've had one between 2014-2015) (06/21/2013)   Neuromuscular disorder (HCC)    Osteoarthritis    Skin cancer; face 2023   Sleep apnea    WEIGHT LOSS, NO LONGER NEEDS PER PATIENT   Type II diabetes mellitus (HCC)    TYPE 2    Past Surgical History:  Procedure Laterality Date   CARDIAC CATHETERIZATION  1980's   once   CARDIAC CATHETERIZATION N/A 07/23/2015   Procedure: Left Heart Cath and Coronary Angiography;  Surgeon: Ozell Fell, MD;  Location: [facility] INVASIVE CV LAB;  Service: Cardiovascular;  Laterality: N/A;   CARDIOVASCULAR STRESS TEST  06/10/2008   EF 68%    CARPAL TUNNEL RELEASE Bilateral    COLONOSCOPY     CORONARY ANGIOPLASTY WITH STENT PLACEMENT  06/21/2013   3   CORONARY ARTERY BYPASS GRAFT N/A 11/07/2016   Procedure: CORONARY ARTERY BYPASS GRAFTING (CABG) x 5 (LIMA to DISTAL LAD, SVG to DIAGONAL, SVG to CIRCUMFLEX, and SVG SEQUENTIALLY to PLB and DISTAL PDA) with EVH of the RIGHT GREATER SAPHENOUS VEIN and LEFT INTERNAL MAMMARY ARTERY HARVEST;  Surgeon: Army Dallas NOVAK, MD;  Location: [facility] OR;  Service: Open Heart Surgery;  Laterality: N/A;   CORONARY PRESSURE/FFR STUDY N/A 10/28/2016   Procedure: INTRAVASCULAR PRESSURE WIRE/FFR STUDY;  Surgeon: Anner Alm ORN, MD;  Location: [facility] INVASIVE CV LAB;  Service: Cardiovascular;  Laterality: N/A;   CORONARY STENT INTERVENTION N/A 06/07/2016   Procedure: Coronary Stent Intervention;  Surgeon: Lonni JONETTA Cash, MD;  Location: [facility] INVASIVE CV LAB;  Service: Cardiovascular;  Laterality: N/A;   EXCISION MORTON'S NEUROMA Left    EYE SURGERY Left    removed film over my eye   FRACTIONAL FLOW RESERVE WIRE N/A 06/26/2013   Procedure: FRACTIONAL FLOW RESERVE WIRE;  Surgeon: Deatrice DELENA Cage, MD;  Location: [facility] CATH LAB;  Service: Cardiovascular;  Laterality: N/A;   HERNIA REPAIR Left    JOINT  REPLACEMENT Left 03/2013   thumb   LEFT HEART CATH AND CORONARY ANGIOGRAPHY N/A 06/07/2016   Procedure: Left Heart Cath and Coronary Angiography;  Surgeon: Lonni JONETTA Cash, MD;  Location: [facility] INVASIVE CV LAB;  Service: Cardiovascular;  Laterality: N/A;   LEFT HEART CATH AND CORONARY ANGIOGRAPHY N/A 10/28/2016   Procedure: LEFT HEART CATH AND CORONARY ANGIOGRAPHY;  Surgeon: Anner Alm ORN, MD;  Location: [facility] INVASIVE CV LAB;  Service: Cardiovascular;  Laterality: N/A;   LEFT HEART CATH AND CORS/GRAFTS ANGIOGRAPHY N/A 12/25/2017   Procedure: LEFT HEART CATH AND CORS/GRAFTS ANGIOGRAPHY;  Surgeon: Wonda Sharper, MD;  Location: [facility] INVASIVE CV LAB;  Service: Cardiovascular;  Laterality: N/A;   LEFT HEART  CATH AND CORS/GRAFTS ANGIOGRAPHY N/A 07/26/2019   Procedure: LEFT HEART CATH AND CORS/GRAFTS ANGIOGRAPHY;  Surgeon: Darron Deatrice LABOR, MD;  Location: [facility] INVASIVE CV LAB;  Service: Cardiovascular;  Laterality: N/A;   LEFT HEART CATHETERIZATION WITH CORONARY ANGIOGRAM N/A 06/21/2013   Procedure: LEFT HEART CATHETERIZATION WITH CORONARY ANGIOGRAM;  Surgeon: Lonni JONETTA Cash, MD;  Location: [facility] CATH LAB;  Service: Cardiovascular;  Laterality: N/A;   LEFT HEART CATHETERIZATION WITH CORONARY ANGIOGRAM N/A 06/26/2013   Procedure: LEFT HEART CATHETERIZATION WITH CORONARY ANGIOGRAM;  Surgeon: Deatrice LABOR Darron, MD;  Location: [facility] CATH LAB;  Service: Cardiovascular;  Laterality: N/A;   LUMBAR LAMINECTOMY/DECOMPRESSION MICRODISCECTOMY Left 05/30/2018   Procedure: Left  Gill L5 decompression;  Surgeon: Burnetta Aures, MD;  Location: [facility] OR;  Service: Orthopedics;  Laterality: Left;    POLYPECTOMY     TEE WITHOUT CARDIOVERSION N/A 11/07/2016   Procedure: TRANSESOPHAGEAL ECHOCARDIOGRAM (TEE);  Surgeon: Army Dallas NOVAK, MD;  Location: [facility] OR;  Service: Open Heart Surgery;  Laterality: N/A;   TONSILLECTOMY     TOTAL HIP ARTHROPLASTY Right 04/12/2016   Procedure: RIGHT TOTAL HIP ARTHROPLASTY ANTERIOR APPROACH;  Surgeon: Donnice Car, MD;  Location: [facility] ORS;  Service: Orthopedics;  Laterality: Right;  requests 70 mins   TOTAL SHOULDER ARTHROPLASTY  03/01/2012   Procedure: TOTAL SHOULDER ARTHROPLASTY;  Surgeon: Franky CHRISTELLA Pointer, MD;  Location: [facility] OR;  Service: Orthopedics;  Laterality: Right;   UMBILICAL HERNIA REPAIR      Current Outpatient Medications  Medication Sig Dispense Refill   ALPRAZolam  (XANAX ) 0.5 MG tablet Take 1 tablet (0.5 mg total) by mouth up to 2 (two) times daily as needed for anxiety as directed. 60 tablet 1   amLODipine  (NORVASC ) 5 MG tablet Take 1 tablet (5 mg total) by mouth 2 (two) times daily. 180 tablet 2   aspirin  EC 81 MG tablet Take 1 tablet (81 mg total) by mouth at bedtime.  30 tablet 11   atenolol  (TENORMIN ) 50 MG tablet Take 1 tablet (50 mg total) by mouth daily. 100 tablet 3   atorvastatin  (LIPITOR ) 80 MG tablet Take 1 tablet (80 mg total) by mouth daily. 90 tablet 3   calcium  carbonate (TUMS - DOSED IN MG ELEMENTAL CALCIUM ) 500 MG chewable tablet Chew 2 tablets by mouth as needed for indigestion or heartburn.      chlorpheniramine-HYDROcodone  (TUSSIONEX) 10-8 MG/5ML Take 5 mLs by mouth every 12 (twelve) hours as needed for cough. Caution: may cause sedation. 100 mL 0   ciclopirox  (LOPROX ) 0.77 % cream Apply 1 Application topically daily as needed (irritation).     clopidogrel  (PLAVIX ) 75 MG tablet Take 1 tablet (75 mg total) by mouth daily with breakfast. 90 tablet 3   docusate sodium  (COLACE) 100 MG capsule Take 100 mg by mouth 2 (two)  times daily.     Empagliflozin -metFORMIN  HCl ER (SYNJARDY XR) 25-1000 MG TB24 Take by mouth daily.     famotidine  (PEPCID ) 40 MG tablet Take 1 tablet (40 mg total) by mouth 2 (two) times daily. 60 tablet 5   gabapentin  (NEURONTIN ) 300 MG capsule Take 2 capsules (600 mg total) by mouth at bedtime. 180 capsule 3   galantamine  (RAZADYNE  ER) 8 MG 24 hr capsule Take 8 mg by mouth daily with breakfast.     glucose blood test strip Use to test blood sugar levels up to 3 (three) times a day. 300 each 11   hydrochlorothiazide  (HYDRODIURIL ) 25 MG tablet Take 1 tablet (25 mg total) by mouth in the morning. 90 tablet 3   irbesartan  (AVAPRO ) 300 MG tablet Take 1 tablet (300 mg total) by mouth daily. 90 tablet 3   isosorbide  mononitrate (IMDUR ) 60 MG 24 hr tablet Take 1 tablet (60 mg total) by mouth daily. 90 tablet 3   ketoconazole  (NIZORAL ) 2 % cream Apply a small amount topically to skin 2 (two) times daily. 60 g 2   ketoconazole  (NIZORAL ) 2 % cream Apply a small amount to skin 2 (two) times daily. 60 g 2   melatonin 5 MG TABS Take 5 mg by mouth.     memantine  (NAMENDA ) 5 MG tablet Take 1 tablet (5 mg total) by mouth 2 (two) times daily. 180  tablet 1   Naftifine HCl 2 % CREA Apply 1 Application topically 2 (two) times daily as needed (yeast).     nitroGLYCERIN  (NITROSTAT ) 0.4 MG SL tablet Place 1 tablet (0.4 mg total) under the tongue as needed for chest pain, if no relief call 911. 25 tablet 5   pantoprazole  (PROTONIX ) 40 MG tablet Take 1 tablet (40 mg total) by mouth 2 (two) times daily. 180 tablet 0   polyethylene glycol (MIRALAX  / GLYCOLAX ) 17 g packet Take 17 g by mouth daily.     promethazine  (PHENERGAN ) 12.5 MG tablet Take 1 tablet (12.5 mg total) by mouth up to 2 (two) times daily as needed for nausea 30 tablet 1   promethazine  (PHENERGAN ) 12.5 MG tablet Take 1 tablet (12.5 mg total) by mouth up to 2 (two) times daily as needed for nausea. 30 tablet 3   ranolazine  (RANEXA ) 500 MG 12 hr tablet Take 1 tablet (500 mg total) by mouth 2 (two) times daily. 180 tablet 3   terbinafine  (LAMISIL ) 250 MG tablet Take 1 tablet (250 mg total) by mouth daily for 6 weeks. 44 tablet 0   TRESIBA  FLEXTOUCH 100 UNIT/ML FlexTouch Pen Inject 20 Units into the skin daily.     triamcinolone cream (KENALOG) 0.1 % Apply 1 application  topically 2 (two) times daily as needed (irritation).     Vitamin D , Cholecalciferol , 25 MCG (1000 UT) CAPS Take 1 capsule by mouth daily.     No current facility-administered medications for this visit.    Allergies[1]  Social History   Socioeconomic History   Marital status: Married    Spouse name: [name]   Number of children: 3   Years of education: Not on file   Highest education level: High school graduate  Occupational History   Not on file  Tobacco Use   Smoking status: Never    Passive exposure: Never   Smokeless tobacco: Never  Vaping Use   Vaping status: Never Used  Substance and Sexual Activity   Alcohol  use: No   Drug use: No   Sexual activity: Yes  Other Topics Concern   Not on file  Social History Narrative   Lives at home with wife and temporarily young son   Right handed    Caffeine: 1 C of coffee in the AM   Social Drivers of Health   Tobacco Use: Low Risk (02/07/2024)   Patient History    Smoking Tobacco Use: Never    Smokeless Tobacco Use: Never    Passive Exposure: Never  Financial Resource Strain: Not on file  Food Insecurity: Not on file  Transportation Needs: Not on file  Physical Activity: Not on file  Stress: Not on file  Social Connections: Not on file  Intimate Partner Violence: Unknown (11/28/2022)   Received from [facility]   HITS    Scream or Curse: Not on file    Threaten Physical Harm: Not on file    Insult or Talk Down To: Not on file    Physically Hurt: Not on file  Depression (PHQ2-9): Not on file  Alcohol  Screen: Not on file  Housing: Not on file  Utilities: Not on file  Health Literacy: Not on file    Family History  Problem Relation Age of Onset   Colon polyps Mother    Diabetes type II Mother    Heart disease Father    Heart attack Father    Heart disease Brother    Coronary artery disease Cousin    Colon cancer Neg Hx    Pancreatic cancer Neg Hx    Esophageal cancer Neg Hx    Stomach cancer Neg Hx    Liver disease Neg Hx    Rectal cancer Neg Hx     Review of Systems:  As stated in the HPI and otherwise negative.   BP 130/64   Pulse 62   Ht 5' 9 (1.753 m)   Wt 195 lb (88.5 kg)   SpO2 98%   BMI 28.80 kg/m   Physical Examination: General: Well developed, well nourished, NAD  HEENT: OP clear, mucus membranes moist  SKIN: warm, dry. No rashes. Neuro: No focal deficits  Musculoskeletal: Muscle strength 5/5 all ext  Psychiatric: Mood and affect normal  Neck: No JVD, no carotid bruits, no thyromegaly, no lymphadenopathy.  Lungs:Clear bilaterally, no wheezes, rhonci, crackles Cardiovascular: Regular rate and rhythm. No murmurs, gallops or rubs. Abdomen:Soft. Bowel sounds present. Non-tender.  Extremities: No lower extremity edema. Pulses are 2 + in the bilateral DP/PT.  EKG:  EKG is not ordered  today. The ekg ordered today demonstrates   Recent Labs: No results found for requested labs within last 365 days.   Lipid Panel    Component Value Date/Time   CHOL 105 02/19/2020 0850   TRIG 78 02/19/2020 0850   HDL 36 (L) 02/19/2020 0850   CHOLHDL 2.9 02/19/2020 0850   CHOLHDL 2.7 07/26/2019 0052   VLDL 14 07/26/2019 0052   LDLCALC 53 02/19/2020 0850     Wt Readings from Last 3 Encounters:  02/07/24 195 lb (88.5 kg)  11/29/23 196 lb (88.9 kg)  08/25/23 196 lb 12.8 oz (89.3 kg)    Assessment and Plan:   1. CAD s/p CABG without angina: He has no chest pain suggestive of angina. Continue ASA, Plavix , atenolol , Lipitor , Imdur  and Ranexa   2. HTN: BP is well controlled. Continue current meds.   3. HLD: LDL at goal per pt. Continue Lipitor   4. Thoracic aortic aneurysm: 4.2 cm by CT chest September 2025. Will need repeat chest CTA in September 2026.   Labs/  tests ordered today include:  No orders of the defined types were placed in this encounter.  Disposition:   F/U with me in one year   Signed, [name], MD, [facility] 02/07/2024 11:01 AM    [name] [address] [place], [place], [place]  [address] Phone: [phone]; Fax: [phone]       [1]  Allergies Allergen Reactions   Donepezil Nausea Only   Metformin      Other Reaction(s): Not available   Metformin  Hcl Er     Other Reaction(s): nausea and GI distress   Sertraline  Hcl     Other Reaction(s): zombie   Hydrocodone  Nausea Only    Other reaction(s): nauseated  Other Reaction(s): nauseated, Not available  hydrocodone    Methocarbamol  Nausea Only    Other reaction(s): nauseated  Other Reaction(s): nauseated, Not available  methocarbamol    Oxycodone  Nausea Only   "

## 2024-02-07 ENCOUNTER — Encounter: Payer: Self-pay | Admitting: Cardiovascular Disease

## 2024-02-07 ENCOUNTER — Ambulatory Visit: Attending: Cardiology | Admitting: Cardiovascular Disease

## 2024-02-07 VITALS — BP 130/64 | HR 62 | Ht 69.0 in | Wt 195.0 lb

## 2024-02-07 DIAGNOSIS — E785 Hyperlipidemia, unspecified: Secondary | ICD-10-CM

## 2024-02-07 DIAGNOSIS — I2510 Atherosclerotic heart disease of native coronary artery without angina pectoris: Secondary | ICD-10-CM

## 2024-02-07 DIAGNOSIS — I7121 Aneurysm of the ascending aorta, without rupture: Secondary | ICD-10-CM

## 2024-02-07 DIAGNOSIS — I10 Essential (primary) hypertension: Secondary | ICD-10-CM

## 2024-02-07 NOTE — Patient Instructions (Signed)

## 2024-02-13 ENCOUNTER — Other Ambulatory Visit (HOSPITAL_COMMUNITY): Payer: Self-pay

## 2024-02-13 MED ORDER — PANTOPRAZOLE SODIUM 40 MG PO TBEC
40.0000 mg | DELAYED_RELEASE_TABLET | Freq: Two times a day (BID) | ORAL | 0 refills | Status: AC
  Filled 2024-02-13: qty 180, 90d supply, fill #0

## 2024-02-18 ENCOUNTER — Other Ambulatory Visit (HOSPITAL_COMMUNITY): Payer: Self-pay

## 2024-02-19 ENCOUNTER — Other Ambulatory Visit: Payer: Self-pay

## 2024-02-22 ENCOUNTER — Other Ambulatory Visit (HOSPITAL_COMMUNITY): Payer: Self-pay

## 2024-02-22 ENCOUNTER — Other Ambulatory Visit: Payer: Self-pay

## 2024-02-22 LAB — LAB REPORT - SCANNED
A1c: 7.4
EGFR: 64.9

## 2024-02-22 MED ORDER — ALPRAZOLAM 0.5 MG PO TABS
0.5000 mg | ORAL_TABLET | Freq: Two times a day (BID) | ORAL | 1 refills | Status: AC | PRN
  Filled 2024-02-22: qty 60, 30d supply, fill #0

## 2024-02-22 MED ORDER — IRBESARTAN 300 MG PO TABS
300.0000 mg | ORAL_TABLET | Freq: Every day | ORAL | 3 refills | Status: AC
  Filled 2024-02-22: qty 90, 90d supply, fill #0

## 2024-02-23 ENCOUNTER — Encounter (HOSPITAL_COMMUNITY): Payer: Self-pay

## 2024-02-23 ENCOUNTER — Other Ambulatory Visit (HOSPITAL_COMMUNITY): Payer: Self-pay

## 2024-03-01 ENCOUNTER — Encounter: Payer: Self-pay | Admitting: Gastroenterology

## 2024-03-05 ENCOUNTER — Other Ambulatory Visit (HOSPITAL_COMMUNITY): Payer: Self-pay

## 2024-03-27 ENCOUNTER — Ambulatory Visit: Admitting: Gastroenterology

## 2024-04-02 ENCOUNTER — Ambulatory Visit: Admitting: Podiatry

## 2024-07-22 ENCOUNTER — Ambulatory Visit: Admitting: Neurology
